# Patient Record
Sex: Female | Born: 1970 | Race: Black or African American | Hispanic: No | Smoking: Former smoker
Health system: Southern US, Community
[De-identification: ages and names within clinical notes are randomized; demographics above are authoritative.]

## PROBLEM LIST (undated history)

## (undated) ENCOUNTER — Emergency Department (HOSPITAL_COMMUNITY): Admission: EM | Payer: Medicaid Other | Source: Home / Self Care

## (undated) DIAGNOSIS — M79604 Pain in right leg: Secondary | ICD-10-CM

## (undated) DIAGNOSIS — F1121 Opioid dependence, in remission: Secondary | ICD-10-CM

## (undated) DIAGNOSIS — M25512 Pain in left shoulder: Secondary | ICD-10-CM

## (undated) DIAGNOSIS — F1110 Opioid abuse, uncomplicated: Secondary | ICD-10-CM

## (undated) DIAGNOSIS — D5700 Hb-SS disease with crisis, unspecified: Secondary | ICD-10-CM

## (undated) DIAGNOSIS — R0902 Hypoxemia: Secondary | ICD-10-CM

## (undated) HISTORY — PX: OTHER SURGICAL HISTORY: SHX169

## (undated) HISTORY — PX: APPENDECTOMY: SHX54

---

## 1997-07-17 ENCOUNTER — Inpatient Hospital Stay (HOSPITAL_COMMUNITY): Admission: AD | Admit: 1997-07-17 | Discharge: 1997-07-17 | Payer: Self-pay | Admitting: Obstetrics & Gynecology

## 1997-07-29 ENCOUNTER — Inpatient Hospital Stay (HOSPITAL_COMMUNITY): Admission: EM | Admit: 1997-07-29 | Discharge: 1997-07-29 | Payer: Self-pay | Admitting: *Deleted

## 1997-08-22 ENCOUNTER — Encounter: Admission: RE | Admit: 1997-08-22 | Discharge: 1997-08-22 | Payer: Self-pay | Admitting: Internal Medicine

## 1997-09-01 ENCOUNTER — Encounter: Admission: RE | Admit: 1997-09-01 | Discharge: 1997-09-01 | Payer: Self-pay | Admitting: Internal Medicine

## 1997-10-29 ENCOUNTER — Inpatient Hospital Stay (HOSPITAL_COMMUNITY): Admission: AD | Admit: 1997-10-29 | Discharge: 1997-10-29 | Payer: Self-pay | Admitting: Obstetrics

## 1997-12-02 ENCOUNTER — Inpatient Hospital Stay (HOSPITAL_COMMUNITY): Admission: AD | Admit: 1997-12-02 | Discharge: 1997-12-02 | Payer: Self-pay | Admitting: *Deleted

## 1998-01-23 ENCOUNTER — Encounter: Admission: RE | Admit: 1998-01-23 | Discharge: 1998-01-23 | Payer: Self-pay | Admitting: Internal Medicine

## 1998-02-23 ENCOUNTER — Encounter: Payer: Self-pay | Admitting: Internal Medicine

## 1998-02-23 ENCOUNTER — Inpatient Hospital Stay (HOSPITAL_COMMUNITY): Admission: EM | Admit: 1998-02-23 | Discharge: 1998-02-25 | Payer: Self-pay | Admitting: Emergency Medicine

## 1998-05-13 ENCOUNTER — Inpatient Hospital Stay (HOSPITAL_COMMUNITY): Admission: AD | Admit: 1998-05-13 | Discharge: 1998-05-13 | Payer: Self-pay | Admitting: Obstetrics

## 1998-05-27 ENCOUNTER — Observation Stay (HOSPITAL_COMMUNITY): Admission: AD | Admit: 1998-05-27 | Discharge: 1998-05-28 | Payer: Self-pay | Admitting: *Deleted

## 1998-06-10 ENCOUNTER — Encounter: Admission: RE | Admit: 1998-06-10 | Discharge: 1998-06-10 | Payer: Self-pay | Admitting: Obstetrics & Gynecology

## 1998-06-30 ENCOUNTER — Inpatient Hospital Stay (HOSPITAL_COMMUNITY): Admission: AD | Admit: 1998-06-30 | Discharge: 1998-06-30 | Payer: Self-pay | Admitting: *Deleted

## 1998-07-01 ENCOUNTER — Encounter: Admission: RE | Admit: 1998-07-01 | Discharge: 1998-07-01 | Payer: Self-pay | Admitting: Obstetrics & Gynecology

## 1998-07-03 ENCOUNTER — Ambulatory Visit (HOSPITAL_COMMUNITY): Admission: RE | Admit: 1998-07-03 | Discharge: 1998-07-03 | Payer: Self-pay | Admitting: Obstetrics

## 1998-07-15 ENCOUNTER — Encounter: Admission: RE | Admit: 1998-07-15 | Discharge: 1998-07-15 | Payer: Self-pay | Admitting: Obstetrics & Gynecology

## 1998-08-04 ENCOUNTER — Inpatient Hospital Stay (HOSPITAL_COMMUNITY): Admission: AD | Admit: 1998-08-04 | Discharge: 1998-08-04 | Payer: Self-pay | Admitting: *Deleted

## 1998-08-12 ENCOUNTER — Encounter: Admission: RE | Admit: 1998-08-12 | Discharge: 1998-08-12 | Payer: Self-pay | Admitting: Obstetrics & Gynecology

## 1998-08-26 ENCOUNTER — Encounter: Admission: RE | Admit: 1998-08-26 | Discharge: 1998-08-26 | Payer: Self-pay | Admitting: Obstetrics & Gynecology

## 1998-08-26 ENCOUNTER — Inpatient Hospital Stay (HOSPITAL_COMMUNITY): Admission: AD | Admit: 1998-08-26 | Discharge: 1998-08-26 | Payer: Self-pay | Admitting: *Deleted

## 1998-09-02 ENCOUNTER — Encounter (HOSPITAL_COMMUNITY): Admission: RE | Admit: 1998-09-02 | Discharge: 1998-12-01 | Payer: Self-pay | Admitting: Obstetrics & Gynecology

## 1998-09-02 ENCOUNTER — Encounter: Admission: RE | Admit: 1998-09-02 | Discharge: 1998-09-02 | Payer: Self-pay | Admitting: Obstetrics & Gynecology

## 1998-09-09 ENCOUNTER — Encounter: Admission: RE | Admit: 1998-09-09 | Discharge: 1998-09-09 | Payer: Self-pay | Admitting: Obstetrics & Gynecology

## 1998-09-25 ENCOUNTER — Encounter: Payer: Self-pay | Admitting: Obstetrics & Gynecology

## 1998-09-30 ENCOUNTER — Encounter: Admission: RE | Admit: 1998-09-30 | Discharge: 1998-09-30 | Payer: Self-pay | Admitting: Obstetrics & Gynecology

## 1998-10-14 ENCOUNTER — Encounter: Admission: RE | Admit: 1998-10-14 | Discharge: 1998-10-14 | Payer: Self-pay | Admitting: Obstetrics & Gynecology

## 1998-10-22 ENCOUNTER — Inpatient Hospital Stay (HOSPITAL_COMMUNITY): Admission: AD | Admit: 1998-10-22 | Discharge: 1998-10-22 | Payer: Self-pay | Admitting: Obstetrics & Gynecology

## 1998-10-22 ENCOUNTER — Encounter: Admission: RE | Admit: 1998-10-22 | Discharge: 1998-10-22 | Payer: Self-pay | Admitting: Obstetrics

## 1998-10-29 ENCOUNTER — Inpatient Hospital Stay (HOSPITAL_COMMUNITY): Admission: AD | Admit: 1998-10-29 | Discharge: 1998-10-29 | Payer: Self-pay | Admitting: Obstetrics & Gynecology

## 1998-10-29 ENCOUNTER — Encounter: Admission: RE | Admit: 1998-10-29 | Discharge: 1998-10-29 | Payer: Self-pay | Admitting: Obstetrics

## 1998-11-03 ENCOUNTER — Inpatient Hospital Stay (HOSPITAL_COMMUNITY): Admission: AD | Admit: 1998-11-03 | Discharge: 1998-11-03 | Payer: Self-pay | Admitting: *Deleted

## 1998-11-05 ENCOUNTER — Encounter: Payer: Self-pay | Admitting: Obstetrics

## 1998-11-05 ENCOUNTER — Ambulatory Visit (HOSPITAL_COMMUNITY): Admission: RE | Admit: 1998-11-05 | Discharge: 1998-11-05 | Payer: Self-pay | Admitting: Obstetrics

## 1998-11-05 ENCOUNTER — Encounter: Admission: RE | Admit: 1998-11-05 | Discharge: 1998-11-05 | Payer: Self-pay | Admitting: Obstetrics

## 1998-11-26 ENCOUNTER — Inpatient Hospital Stay (HOSPITAL_COMMUNITY): Admission: AD | Admit: 1998-11-26 | Discharge: 1998-11-26 | Payer: Self-pay | Admitting: Obstetrics & Gynecology

## 1999-03-16 ENCOUNTER — Encounter: Payer: Self-pay | Admitting: Emergency Medicine

## 1999-03-17 ENCOUNTER — Inpatient Hospital Stay (HOSPITAL_COMMUNITY): Admission: EM | Admit: 1999-03-17 | Discharge: 1999-03-19 | Payer: Self-pay | Admitting: Emergency Medicine

## 1999-11-16 ENCOUNTER — Observation Stay (HOSPITAL_COMMUNITY): Admission: EM | Admit: 1999-11-16 | Discharge: 1999-11-16 | Payer: Self-pay | Admitting: Emergency Medicine

## 1999-12-21 ENCOUNTER — Encounter: Admission: RE | Admit: 1999-12-21 | Discharge: 1999-12-21 | Payer: Self-pay | Admitting: Obstetrics & Gynecology

## 2000-06-01 ENCOUNTER — Encounter: Admission: RE | Admit: 2000-06-01 | Discharge: 2000-06-01 | Payer: Self-pay | Admitting: Obstetrics

## 2000-06-01 ENCOUNTER — Other Ambulatory Visit: Admission: RE | Admit: 2000-06-01 | Discharge: 2000-06-01 | Payer: Self-pay | Admitting: Obstetrics

## 2000-06-26 ENCOUNTER — Inpatient Hospital Stay (HOSPITAL_COMMUNITY): Admission: EM | Admit: 2000-06-26 | Discharge: 2000-06-28 | Payer: Self-pay | Admitting: Emergency Medicine

## 2000-06-27 ENCOUNTER — Encounter: Payer: Self-pay | Admitting: Family Medicine

## 2000-06-28 ENCOUNTER — Encounter: Payer: Self-pay | Admitting: Family Medicine

## 2000-12-15 ENCOUNTER — Inpatient Hospital Stay (HOSPITAL_COMMUNITY): Admission: AD | Admit: 2000-12-15 | Discharge: 2000-12-15 | Payer: Self-pay | Admitting: Obstetrics & Gynecology

## 2001-06-16 ENCOUNTER — Inpatient Hospital Stay (HOSPITAL_COMMUNITY): Admission: AD | Admit: 2001-06-16 | Discharge: 2001-06-16 | Payer: Self-pay

## 2001-12-12 ENCOUNTER — Inpatient Hospital Stay (HOSPITAL_COMMUNITY): Admission: AD | Admit: 2001-12-12 | Discharge: 2001-12-12 | Payer: Self-pay | Admitting: Obstetrics and Gynecology

## 2003-08-18 ENCOUNTER — Emergency Department (HOSPITAL_COMMUNITY): Admission: AD | Admit: 2003-08-18 | Discharge: 2003-08-18 | Payer: Self-pay | Admitting: Family Medicine

## 2003-09-01 ENCOUNTER — Inpatient Hospital Stay (HOSPITAL_COMMUNITY): Admission: EM | Admit: 2003-09-01 | Discharge: 2003-09-03 | Payer: Self-pay | Admitting: Family Medicine

## 2006-02-18 ENCOUNTER — Inpatient Hospital Stay (HOSPITAL_COMMUNITY): Admission: EM | Admit: 2006-02-18 | Discharge: 2006-02-20 | Payer: Self-pay | Admitting: Emergency Medicine

## 2007-07-24 ENCOUNTER — Inpatient Hospital Stay (HOSPITAL_COMMUNITY): Admission: EM | Admit: 2007-07-24 | Discharge: 2007-07-26 | Payer: Self-pay | Admitting: Emergency Medicine

## 2008-04-26 ENCOUNTER — Emergency Department (HOSPITAL_COMMUNITY): Admission: EM | Admit: 2008-04-26 | Discharge: 2008-04-26 | Payer: Self-pay | Admitting: Emergency Medicine

## 2009-01-23 ENCOUNTER — Ambulatory Visit (HOSPITAL_COMMUNITY): Admission: RE | Admit: 2009-01-23 | Discharge: 2009-01-23 | Payer: Self-pay | Admitting: Ophthalmology

## 2009-03-03 ENCOUNTER — Ambulatory Visit (HOSPITAL_COMMUNITY): Admission: RE | Admit: 2009-03-03 | Discharge: 2009-03-04 | Payer: Self-pay | Admitting: Ophthalmology

## 2009-07-15 ENCOUNTER — Observation Stay (HOSPITAL_COMMUNITY): Admission: EM | Admit: 2009-07-15 | Discharge: 2009-07-15 | Payer: Self-pay | Admitting: Emergency Medicine

## 2009-11-20 ENCOUNTER — Emergency Department (HOSPITAL_COMMUNITY): Admission: EM | Admit: 2009-11-20 | Discharge: 2009-11-20 | Payer: Self-pay | Admitting: Emergency Medicine

## 2010-07-24 LAB — RETICULOCYTES
RBC.: 3.56 MIL/uL — ABNORMAL LOW (ref 3.87–5.11)
Retic Count, Absolute: 103.2 10*3/uL (ref 19.0–186.0)

## 2010-07-24 LAB — POCT I-STAT, CHEM 8
Chloride: 108 mEq/L (ref 96–112)
Creatinine, Ser: 0.8 mg/dL (ref 0.4–1.2)
Potassium: 3.9 mEq/L (ref 3.5–5.1)
Sodium: 142 mEq/L (ref 135–145)

## 2010-07-24 LAB — URINALYSIS, ROUTINE W REFLEX MICROSCOPIC
Bilirubin Urine: NEGATIVE
Glucose, UA: NEGATIVE mg/dL
Hgb urine dipstick: NEGATIVE
Ketones, ur: NEGATIVE mg/dL
Nitrite: NEGATIVE
Specific Gravity, Urine: 1.014 (ref 1.005–1.030)
Urobilinogen, UA: 0.2 mg/dL (ref 0.0–1.0)
pH: 5 (ref 5.0–8.0)

## 2010-07-24 LAB — DIFFERENTIAL
Basophils Relative: 0 % (ref 0–1)
Eosinophils Absolute: 0 10*3/uL (ref 0.0–0.7)
Eosinophils Relative: 0 % (ref 0–5)
Lymphs Abs: 6.6 10*3/uL — ABNORMAL HIGH (ref 0.7–4.0)
Monocytes Absolute: 0.8 10*3/uL (ref 0.1–1.0)
Neutro Abs: 4.3 10*3/uL (ref 1.7–7.7)
Neutrophils Relative %: 37 % — ABNORMAL LOW (ref 43–77)

## 2010-07-24 LAB — CBC
HCT: 34.1 % — ABNORMAL LOW (ref 36.0–46.0)
Hemoglobin: 11.3 g/dL — ABNORMAL LOW (ref 12.0–15.0)
MCH: 31.5 pg (ref 26.0–34.0)
MCHC: 33.1 g/dL (ref 30.0–36.0)
RBC: 3.58 MIL/uL — ABNORMAL LOW (ref 3.87–5.11)
WBC: 11.7 10*3/uL — ABNORMAL HIGH (ref 4.0–10.5)

## 2010-07-24 LAB — POCT PREGNANCY, URINE: Preg Test, Ur: NEGATIVE

## 2010-07-24 LAB — POCT CARDIAC MARKERS: CKMB, poc: <1 ng/mL — ABNORMAL LOW (ref 1.0–8.0)

## 2010-07-30 LAB — DIFFERENTIAL
Basophils Absolute: 0 10*3/uL (ref 0.0–0.1)
Eosinophils Absolute: 0.1 10*3/uL (ref 0.0–0.7)
Lymphs Abs: 4.6 10*3/uL — ABNORMAL HIGH (ref 0.7–4.0)
Monocytes Absolute: 0.8 10*3/uL (ref 0.1–1.0)
Monocytes Relative: 6 % (ref 3–12)
Myelocytes: 0 %

## 2010-07-30 LAB — RETICULOCYTES
RBC.: 3.84 MIL/uL — ABNORMAL LOW (ref 3.87–5.11)
Retic Ct Pct: 2.9 % (ref 0.4–3.1)

## 2010-07-30 LAB — CBC
Hemoglobin: 12.5 g/dL (ref 12.0–15.0)
Platelets: 438 10*3/uL — ABNORMAL HIGH (ref 150–400)
WBC: 12.7 10*3/uL — ABNORMAL HIGH (ref 4.0–10.5)

## 2010-07-30 LAB — POCT I-STAT, CHEM 8
Chloride: 111 mEq/L (ref 96–112)
Glucose, Bld: 91 mg/dL (ref 70–99)
HCT: 30 % — ABNORMAL LOW (ref 36.0–46.0)
Hemoglobin: 10.2 g/dL — ABNORMAL LOW (ref 12.0–15.0)
Sodium: 144 mEq/L (ref 135–145)
TCO2: 22 mmol/L (ref 0–100)

## 2010-08-07 ENCOUNTER — Emergency Department (HOSPITAL_COMMUNITY): Payer: Self-pay

## 2010-08-07 ENCOUNTER — Emergency Department (HOSPITAL_COMMUNITY)
Admission: EM | Admit: 2010-08-07 | Discharge: 2010-08-08 | Disposition: A | Discharge status: Home / Self Care | Payer: Self-pay | Attending: Emergency Medicine | Admitting: Emergency Medicine

## 2010-08-07 DIAGNOSIS — M79609 Pain in unspecified limb: Secondary | ICD-10-CM | POA: Insufficient documentation

## 2010-08-07 DIAGNOSIS — D5700 Hb-SS disease with crisis, unspecified: Secondary | ICD-10-CM | POA: Insufficient documentation

## 2010-08-08 LAB — CBC
HCT: 29 % — ABNORMAL LOW (ref 36.0–46.0)
MCH: 30.3 pg (ref 26.0–34.0)
MCHC: 36.2 g/dL — ABNORMAL HIGH (ref 30.0–36.0)
RBC: 3.46 MIL/uL — ABNORMAL LOW (ref 3.87–5.11)
WBC: 18.2 10*3/uL — ABNORMAL HIGH (ref 4.0–10.5)

## 2010-08-08 LAB — DIFFERENTIAL
Band Neutrophils: 0 % (ref 0–10)
Basophils Absolute: 0 10*3/uL (ref 0.0–0.1)
Blasts: 0 %
Eosinophils Relative: 3 % (ref 0–5)
Lymphs Abs: 5.6 10*3/uL — ABNORMAL HIGH (ref 0.7–4.0)
Metamyelocytes Relative: 0 %
Monocytes Relative: 7 % (ref 3–12)
Neutro Abs: 10.8 10*3/uL — ABNORMAL HIGH (ref 1.7–7.7)
Promyelocytes Absolute: 0 %

## 2010-08-08 LAB — URINALYSIS, ROUTINE W REFLEX MICROSCOPIC
Glucose, UA: NEGATIVE mg/dL
Hgb urine dipstick: NEGATIVE
Nitrite: NEGATIVE
Urobilinogen, UA: 1 mg/dL (ref 0.0–1.0)

## 2010-08-08 LAB — RETICULOCYTES
Retic Count, Absolute: 134.9 10*3/uL (ref 19.0–186.0)
Retic Ct Pct: 3.9 % — ABNORMAL HIGH (ref 0.4–3.1)

## 2010-08-08 LAB — BASIC METABOLIC PANEL
BUN: 8 mg/dL (ref 6–23)
Creatinine, Ser: 0.71 mg/dL (ref 0.4–1.2)
GFR calc Af Amer: >60 mL/min (ref >60)
Glucose, Bld: 101 mg/dL — ABNORMAL HIGH (ref 70–99)
Sodium: 140 mEq/L (ref 135–145)

## 2010-08-12 LAB — CBC
MCHC: 34.2 g/dL (ref 30.0–36.0)
MCV: 95 fL (ref 78.0–100.0)
Platelets: 454 10*3/uL — ABNORMAL HIGH (ref 150–400)
WBC: 10.2 10*3/uL (ref 4.0–10.5)

## 2010-08-12 LAB — PROTIME-INR: INR: 1.16 (ref 0.00–1.49)

## 2010-08-13 LAB — CBC
HCT: 30.6 % — ABNORMAL LOW (ref 36.0–46.0)
Hemoglobin: 10.1 g/dL — ABNORMAL LOW (ref 12.0–15.0)
MCV: 95.6 fL (ref 78.0–100.0)
RBC: 3.2 MIL/uL — ABNORMAL LOW (ref 3.87–5.11)
WBC: 9.8 10*3/uL (ref 4.0–10.5)

## 2010-09-21 NOTE — H&P (Signed)
NAMEKevan [name] NO.:  [id number]   MEDICAL RECORD NO.:  [id number]          PATIENT TYPE:  INP   LOCATION:  1829                         FACILITY:  [facility]   PHYSICIAN:  Lindsey [name], D.O.    DATE OF BIRTH:  November 11, 1970   DATE OF ADMISSION:  07/24/2007  DATE OF DISCHARGE:                              HISTORY & PHYSICAL   PRIMARY CARE PHYSICIAN:  Lindsey [name], M.D.   HISTORY OF PRESENT ILLNESS:  Chest pain.   HISTORY OF PRESENT ILLNESS:  [name] is a very pleasant 40-year-  old female who suffers with sickle cell disease who was in her usual  state of health until she developed some chest pain, pleuritic in nature  yesterday while at rest.  She had no problems previous to this.  No  fevers and home. No productive cough.  In the emergency department she  underwent a CT scan to rule out a pulmonary embolus, however, his  revealed a 0.9 x 2 cm right upper lobe mass with smaller adjacent  nodules, possible neoplasm or infection.  I spoke with Dr. Chestine Spore who  recommended close followup within two weeks with a repeat imaging study  after a course of treatment for an infectious etiology and perhaps PET  scan or needle biopsy at the time.   PAST MEDICAL HISTORY:  Significant for sickle cell anemia, appendectomy,  as well as Cesarean section, status post bilateral tubal ligation.   MEDICATIONS:  At home she takes morphine, Vicon Forte, doses are not  available at this time.  Will need to be reconciled.   ALLERGIES:  No known drug allergies.   SOCIAL HISTORY:  She denies tobacco, denies regular alcohol use, only  occasional.  She is married with three children.  Her occupation is  working in the call center for [employer].   FAMILY HISTORY:  Unremarkable.   REVIEW OF SYSTEMS:  She states that she has been in her usual state of  health for her usual constellation of symptoms with sickle cell  predominantly involving her lower extremities and does not  usually  involve her chest as described above.  In any event her appetite, weight  have been stable.  She has not reported any fevers, chills, increased  swelling, no recent travel, no toxic exposure.  No known history of  tuberculosis.   PHYSICAL EXAMINATION:  VITAL SIGNS:  In emergency department her vital  signs were stable.  Her blood pressure was 109/59, temperature 97.1,  heart rate 63, respirations 20, oxygen saturation 100% on 2L.  HEENT:  Reveals head to be normocephalic, atraumatic.  Extraocular  movements intact.  NECK:  Supple nontender.  No palpable thyromegaly or masses.  CARDIOVASCULAR:  Exam reveals normal S1 and S2.  LUNGS:  Clear bilaterally.  She does have some splinting with deep  inspiration.  ABDOMEN:  Soft, nontender.  No rebound or guarding.  EXTREMITIES:  Lower extremities reveal no edema, no calf tenderness.   LABORATORY DATA:  Reticulocyte count was 4.5, sodium 142, potassium 4.1,  BUN 13, creatinine 0.7, glucose 94.  White blood cell count 14.9,  hemoglobin 10.1, platelet count 403,000, MCV 94.  CT scan was negative  for pulmonary embolus; however, with lesion as described above.  I spoke  with Dr. Chestine Spore of radiology.   ASSESSMENT:  1. Right upper lobe pneumonia.  2. Mass.  3. Sickle cell pain crisis.  4. Leukocytosis.   PLAN:  At this time Mrs. Drumwright will be admitted for initiation of  intravenous antibiotics and pain control.  Will assess her response and  recommend close followup of this lesion.  She may need a PET scan after  acute resolution of her presenting illness of probable pneumonia.      Lindsey [name], D.O.  Electronically Signed     ESS/MEDQ  D:  07/24/2007  T:  07/25/2007  Job:  [id number]   cc:   Lindsey [name], M.D.

## 2010-09-21 NOTE — Discharge Summary (Signed)
NAMEKevan Rosebush NO.:  [id number]   MEDICAL RECORD NO.:  [id number]          PATIENT TYPE:  INP   LOCATION:  5502                         FACILITY:  [facility]   PHYSICIAN:  Hillery Aldo, M.D.   DATE OF BIRTH:  Feb 22, 1971   DATE OF ADMISSION:  07/24/2007  DATE OF DISCHARGE:  07/26/2007                               DISCHARGE SUMMARY   PRIMARY CARE PHYSICIAN:  Lorelle Formosa, M.D.   DISCHARGE DIAGNOSES:  1. Right upper lobe pneumonia with questionable underlying mass,      followup imaging after full treatment for pneumonia recommended.  2. Sickle cell anemia with vasoocclusive pain crisis.   DISCHARGE MEDICATIONS:  1. Avelox 400 mg daily x5 more days.  2. MS Contin 60 mg q.12 hours.  3. MSIR 30 mg q.6 hours p.r.n.   CONSULTATIONS:  None.   BRIEF ADMISSION HISTORY OF PRESENT ILLNESS:  The patient is a 40-year-  old female who presented to the hospital with a chief complaint of right  pleuritic-type chest pain.  She was admitted for further evaluation when  her initial laboratory tests revealed leukocytosis along with a possible  right upper lobe mass versus infiltrate.  For the full details, please  see the dictated H&P done by Dr. Angelena Sole.   PROCEDURES AND DIAGNOSTIC STUDIES:  1. Chest x-ray on July 24, 2007, showed cardiac enlargement with      vascular congestion, no edema.  Scar in the right lower lobe      unchanged.  2. CT scan of the chest on July 24, 2007, was negative for pulmonary      embolism.  There is vascular congestion.  There was a 9 x 20-mm      mass in the right upper lobe posteriorly with small adjacent      nodule, possibly representative of neoplasm or infection.  Close      followup is warranted.   DISCHARGE LABORATORY VALUES:  Sodium is 142, potassium 3.8, chloride  110, bicarb 27, BUN 3, creatinine 0.72, and glucose 95.  White blood  cell count was 11.2, hemoglobin 9.6, hematocrit 28.4, and platelets 379.   HOSPITAL  COURSE BY PROBLEM:  1. Right upper lobe pneumonia versus mass:  The patient had      leukocytosis and low-grade fever consistent with infectious      etiology.  She was treated empirically with Avelox and after 2 days      of therapy, her white blood cell count went from 14.9 to 11.2.  Her      pain resolved.  We are going to complete a 7-week course of therapy      with Avelox and at that time, she should follow up with Dr.      Ronne Binning for followup imaging to ensure resolution of the pneumonia      and to ensure that there is no underlying mass.  2. Sickle cell anemia with vasoocclusive pain crisis:  The patient was      treated with a combination of her routine MS Contin along with      Dilaudid IV for breakthrough pain.  At this time, she is reporting      no pain and is therefore stable for discharge.   DISPOSITION:  The patient is medically stable and will be discharged  home.  She is instructed to follow up with Dr. Ronne Binning after she  completes her course of antibiotic therapy for followup imaging.      Hillery Aldo, M.D.  Electronically Signed     CR/MEDQ  D:  07/26/2007  T:  07/26/2007  Job:  [id number]   cc:   Lorelle Formosa, M.D.

## 2010-09-24 NOTE — Discharge Summary (Signed)
NAMESIGOURNEY, PORTILLO             ACCOUNT NO.:  [id number]   MEDICAL RECORD NO.:  [id number]          PATIENT TYPE:  INP   LOCATION:  3004                         FACILITY:  [facility]   PHYSICIAN:  Hind I Elsaid, MD      DATE OF BIRTH:  [birth year]   DATE OF ADMISSION:  02/18/2006  DATE OF DISCHARGE:  02/20/2006                                 DISCHARGE SUMMARY   BRIEF HISTORY OF PRESENT ILLNESS:  She is a 40 year old African-American  female with history sickle cell anemia.  She was admitted to the hospital  through the ER with generalized pain and left thigh pain and mainly only her  lower extremities.  She has been treated with IV Dilaudid, but pain is still  there so patient mainly admitted to the hospital for pain control.   HOSPITAL COURSE:  1. The patient was admitted to the floor, IV antibiotics, IV fluid were      started for hydration and IV pain medication was started.  Patient was      started on pain medication, Tylenol 650 mg p.r.n. q.4.h. and morphine      sulfate which the dose is 2 mg IV q.2.h. p.r.n.  Patient also started      on folic acid 1 mg p.o. daily and IV fluid.  2. Patient also have urinary tract infection which has responded to Cipro      IV.  Patient has no fever right now.   DISCHARGE DIAGNOSES:  1. Sickle cell painful crisis.  2. Urinary tract infection.   CONDITION ON DISCHARGE:  Patient feels better, no pain.  Temperature 98.5,  pulse rate 62, blood pressure 126/73, respiratory rate 20.  General  examination is all negative.  Heart examination is normal.  Lung, abdominal  examination, extremities, all of them are negative.  There is no point  tenderness.   PLAN OF CARE:  Patient is to be discharged on MS-Contin 30 mg p.o. q.8.h.  p.r.n. for pain and continue with folic acid 1 mg p.o. daily, for UTI,  patient will receive Cipro 500 mg p.o. b.i.d. for one week and patient to  follow to be followed with Dr. Billee Cashing, her primary care and her  sickle cell specialist, as an outpatient after one week.      Hind Bosie Helper, MD  Electronically Signed     HIE/MEDQ  D:  02/20/2006  T:  02/20/2006  Job:  [id number]

## 2010-09-24 NOTE — Discharge Summary (Signed)
Lindsey [name], HEIMANN NO.:  [id number]   MEDICAL RECORD NO.:  [id number]          PATIENT TYPE:   LOCATION:                                 FACILITY:   PHYSICIAN:  Lorelle Formosa, M.D.   DATE OF BIRTH:   DATE OF ADMISSION:  09/01/2003  DATE OF DISCHARGE:  09/03/2003                                 DISCHARGE SUMMARY   ADMISSION DIAGNOSES:  1.  SS sickle cell vaso-occlusive crisis.  2.  Gastroenteritis.   DISCHARGE DIAGNOSES:  1.  SS sickle cell vaso-occlusive crisis.  2.  Gastroenteritis.   DISCHARGE CONDITION:  Stable.   DISCHARGE DISPOSITION:  Follow up in office in approximately 1 to 2 weeks.   DISCHARGE MEDICATIONS:  The patient is to continue her previous home  medicines which include MS Contin and MSIR.   HISTORY:  This patient is a 40 year old single black woman who has known SS  sickle cell disease. She presented with pain exacerbation over 1 to 2 days.  She was initially seen by Dr. Stacie [name] at the [facility]  emergency room and given IV fluids and Dilaudid IV. She had not improved  enough to go home and thus referred me for admission. She denies headaches,  abdominal or back pain, fever, or chills. Thus, she was admitted. She has  not had any recent admissions to the area hospitals.   Past medical history, family history, personal history, social history, and  review of systems were basically unremarkable.   PHYSICAL EXAMINATION:  VITAL SIGNS:  Revealed blood pressure 103/51, pulse  80, respirations 18, temperature 98.2 orally.  GENERAL:  The patient was lying supine in bed. Rather somber. She was fully  oriented and alert.  HEENT:  Revealed PERRLA. EOMs were intact. Mouth had normal mucosa which was  moist.  NECK:  Supple without jugular venous distention, bruits, nodes, or  thyromegaly.  CHEST:  Clear to auscultation.  HEART:  Regular rate and rhythm without murmur.  ABDOMEN:  Soft and flat. Bowel sounds were  normal.  EXTREMITIES:  Had no lesions or swelling.  NEUROLOGICAL:  Grossly physiologic.   LABORATORY DATA:  CBC revealed a white count of 11.8, hemoglobin 10.3,  hematocrit 29.5, platelets 458,000, retic count 3.1. Chemistries were  basically normal. Lipase was 28.   HOSPITAL COURSE:  The patient was admitted to [facility] and given IV  fluids of D5 1/2 normal saline with 20 mEq of KCl at 100 cc per hour. She  was given Protonix 40 mg IV, ibuprofen 800 mg p.o., Benadryl, and Dilaudid 2  mg with 12.5 mg every 2 hours as needed. She was given Benadryl and  gradually improved. She was able to be transitioned quickly to Dilaudid 4 mg  p.o. for pain and discharged for outpatient management.       ___________________________________________  Lorelle Formosa, M.D.    WWM/MEDQ  D:  03/25/2004  T:  03/26/2004  Job:  [id number]

## 2010-09-24 NOTE — H&P (Signed)
NAMEKENNEDIE, PARDOE NO.:  [id number]   MEDICAL RECORD NO.:  [id number]          PATIENT TYPE:  EMS   LOCATION:  MAJO                         FACILITY:  [facility]   PHYSICIAN:  Mobolaji B. Bakare, M.D.DATE OF BIRTH:  1970-10-07   DATE OF ADMISSION:  02/18/2006  DATE OF DISCHARGE:                                HISTORY & PHYSICAL   PRIMARY CARE PHYSICIAN:  Lorelle Formosa, M.D.   CHIEF COMPLAINT:  Sickle cell crisis.   HISTORY OF PRESENTING COMPLAINT:  Ms. Kyler is a 40 year old African-  American female with a history of sickle cell anemia.  She started  experiencing a left crisis in the last 2 days.  She did not respond to her  usual dose of morphine and she came into the emergency department.  She had  been treated with IV Dilaudid but pain is still lingering on and this  patient needs to be hospitalized for pain control.   She denies any trauma.  There is no pain located elsewhere at this time.  She denies fever or chills, cough, shortness of breath, or chest pain.  There is no vomiting, diarrhea or dysuria or urgency.   REVIEW OF SYSTEMS:  As in the HPI.   PAST MEDICAL HISTORY:  Sickle cell anemia with previous hospitalization for  sickle cell crisis.   MEDICATIONS:  1. Morphine.  The patient cannot remember the dose.  She uses q.4h. p.r.n.  2. Folic acid 1 mg daily.   ALLERGIES:  No known drug allergies.   FAMILY HISTORY:  Father is deceased.  He died from gunshot injury.  Mother  is alive and well.   SOCIAL HISTORY:  She does not drink alcohol or smoke cigarettes.  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   PHYSICAL EXAMINATION:  INITIAL VITALS:  Temperature 99, blood pressure  156/83, pulse 101, respirations are 20, O2 saturations are 100% on room air.  GENERAL:  On examination, the patient is awake, alert, oriented in time,  place and person.  HEENT:  Normocephalic, atraumatic head.  Pupils equal, round, and reactive  to light.  Mucous  membranes moist.  LUNGS:  Clear clinically to auscultation.  CARDIOVASCULAR:  S1, S2, regular, no murmur, no gallop, no rub.  ABDOMEN:  Not distended, not tender.  EXTREMITIES:  She has no obvious swelling in both of her extremities.  There  is tenderness on the left upper limb.  CENTRAL NERVOUS SYSTEM:  No focal neurologic deficits.   INITIAL LABORATORY DATA:  Absolute reticulocyte count 449.  Percentage  slightly elevated, 4.9.  Sedimentation rate 3.  White blood cells 14.7,  hemoglobin 10.7, hematocrit 31.7, platelets 548.  Differential is 37%  neutrophils, 57% lymphocytes.  There are __________  cells.  BMET:  Sodium  140, potassium 4.0, chloride 108, CO2 26, glucose 100, BUN 6, creatinine  0.6, calcium 9.0.  Chest x-ray shows borderline cardiomegaly, mild vascular  congestion.   ASSESSMENT AND PLAN:  Ms. Dodds is a 40 year old African-American female  presenting in sickle cell crisis.  There is no obvious infection.  Leukocytosis is likely reactive.  Will  admit to medical/surgical floor.  Morphine 1-2 mg IV q.2-3h. p.r.n.  Folic acid 1 mg p.o. daily.  Benadryl 25-  50 mg IV q.4h. p.r.n. for itching.  Promethazine 12.5-25 mg IV q.4h. p.r.n.  nausea and vomiting.  Will check urinalysis.  She will be given DVT  prophylaxis and GI prophylaxis.      Mobolaji B. Corky Downs, M.D.  Electronically Signed     MBB/MEDQ  D:  02/18/2006  T:  02/18/2006  Job:  [id number]   cc:   Lorelle Formosa, M.D.

## 2010-09-24 NOTE — Discharge Summary (Signed)
NAMEMarland Kitchen  GEARLINE, SPILMAN                       ACCOUNT NO.:  [id number]   MEDICAL RECORD NO.:  [id number]                   PATIENT TYPE:  INP   LOCATION:  0378                                 FACILITY:  [facility]   PHYSICIAN:  Lorelle Formosa, M.D.           DATE OF BIRTH:  1970-07-24   DATE OF ADMISSION:  09/01/2003  DATE OF DISCHARGE:  09/03/2003                                 DISCHARGE SUMMARY   __________  SS vaso-occlusive disease.  __________   The patient is a __________  admitted with __________  after she was  initially seen in the ER at __________ and given IV fluids and __________  IV.  She did not improve enough to be sent home and was admitted.  __________   Blood pressure 123/50, pulse 80, and respirations 18.  __________                                               Lorelle Formosa, M.D.    WWM/MEDQ  D:  09/25/2003  T:  09/25/2003  Job:  [id number]

## 2011-01-31 LAB — I-STAT 8, (EC8 V) (CONVERTED LAB)
BUN: <3 — ABNORMAL LOW
Bicarbonate: 26.8 — ABNORMAL HIGH
Chloride: 109
Glucose, Bld: 94
HCT: 30 — ABNORMAL LOW
Hemoglobin: 10.2 — ABNORMAL LOW
Sodium: 142
pCO2, Ven: 47.7

## 2011-01-31 LAB — CBC
HCT: 28.4 — ABNORMAL LOW
HCT: 29.5 — ABNORMAL LOW
Hemoglobin: 10.1 — ABNORMAL LOW
Hemoglobin: 9.6 — ABNORMAL LOW
MCHC: 33.7
MCV: 94.3
MCV: 94.5
Platelets: 403 — ABNORMAL HIGH
RBC: 3.01 — ABNORMAL LOW
RDW: 15.5
WBC: 11.2 — ABNORMAL HIGH

## 2011-01-31 LAB — DIFFERENTIAL
Basophils Absolute: 0.1
Eosinophils Absolute: 0.2
Eosinophils Relative: 2
Monocytes Absolute: 0.8

## 2011-01-31 LAB — LIPID PANEL
Triglycerides: 38
VLDL: 8

## 2011-01-31 LAB — BASIC METABOLIC PANEL
CO2: 27
Chloride: 110
Creatinine, Ser: 0.72
GFR calc Af Amer: >60
Potassium: 3.8

## 2011-01-31 LAB — POCT I-STAT CREATININE
Creatinine, Ser: 0.7
Operator id: 265201

## 2011-01-31 LAB — RETICULOCYTES: Retic Ct Pct: 4.5 — ABNORMAL HIGH

## 2011-01-31 LAB — TSH: TSH: 1.192

## 2011-02-10 LAB — CBC
HCT: 33.6 % — ABNORMAL LOW (ref 36.0–46.0)
MCHC: 33.8 g/dL (ref 30.0–36.0)
MCV: 92.8 fL (ref 78.0–100.0)
Platelets: 495 10*3/uL — ABNORMAL HIGH (ref 150–400)
RDW: 15.9 % — ABNORMAL HIGH (ref 11.5–15.5)

## 2011-02-10 LAB — DIFFERENTIAL
Basophils Absolute: 0 10*3/uL (ref 0.0–0.1)
Basophils Relative: 0 % (ref 0–1)
Eosinophils Absolute: 0 10*3/uL (ref 0.0–0.7)
Lymphocytes Relative: 27 % (ref 12–46)
Lymphs Abs: 3.5 10*3/uL (ref 0.7–4.0)
Neutro Abs: 8.8 10*3/uL — ABNORMAL HIGH (ref 1.7–7.7)

## 2011-04-05 ENCOUNTER — Encounter: Payer: Self-pay | Admitting: *Deleted

## 2011-04-05 ENCOUNTER — Emergency Department (HOSPITAL_COMMUNITY)
Admission: EM | Admit: 2011-04-05 | Discharge: 2011-04-05 | Disposition: A | Discharge status: Home / Self Care | Payer: Self-pay | Attending: Emergency Medicine | Admitting: Emergency Medicine

## 2011-04-05 DIAGNOSIS — M79609 Pain in unspecified limb: Secondary | ICD-10-CM | POA: Insufficient documentation

## 2011-04-05 DIAGNOSIS — D5700 Hb-SS disease with crisis, unspecified: Secondary | ICD-10-CM | POA: Insufficient documentation

## 2011-04-05 HISTORY — DX: Hb-SS disease with crisis, unspecified: D57.00

## 2011-04-05 LAB — BASIC METABOLIC PANEL
BUN: 7 mg/dL (ref 6–23)
Calcium: 8.9 mg/dL (ref 8.4–10.5)
GFR calc Af Amer: >90 mL/min (ref >90)
GFR calc non Af Amer: >90 mL/min (ref >90)
Glucose, Bld: 102 mg/dL — ABNORMAL HIGH (ref 70–99)
Potassium: 4.2 mEq/L (ref 3.5–5.1)
Sodium: 138 mEq/L (ref 135–145)

## 2011-04-05 LAB — CBC
Hemoglobin: 10.7 g/dL — ABNORMAL LOW (ref 12.0–15.0)
MCH: 30.2 pg (ref 26.0–34.0)
MCHC: 36.1 g/dL — ABNORMAL HIGH (ref 30.0–36.0)
RDW: 14.9 % (ref 11.5–15.5)

## 2011-04-05 LAB — RETICULOCYTES: RBC.: 3.54 MIL/uL — ABNORMAL LOW (ref 3.87–5.11)

## 2011-04-05 MED ORDER — KETOROLAC TROMETHAMINE 30 MG/ML IJ SOLN
30.0000 mg | Freq: Once | INTRAMUSCULAR | Status: AC
  Administered 2011-04-05: 30 mg via INTRAVENOUS
  Filled 2011-04-05: qty 1

## 2011-04-05 MED ORDER — HYDROMORPHONE HCL PF 1 MG/ML IJ SOLN
1.0000 mg | Freq: Once | INTRAMUSCULAR | Status: AC
  Administered 2011-04-05: 1 mg via INTRAVENOUS
  Filled 2011-04-05: qty 1

## 2011-04-05 MED ORDER — SODIUM CHLORIDE 0.9 % IV BOLUS (SEPSIS)
1000.0000 mL | Freq: Once | INTRAVENOUS | Status: AC
  Administered 2011-04-05: 1000 mL via INTRAVENOUS

## 2011-04-05 NOTE — ED Provider Notes (Signed)
History     CSN: [id number] Arrival date & time: 04/05/2011  1:22 PM   First MD Initiated Contact with Patient 04/05/11 1335      Chief Complaint  Patient presents with  . Sickle Cell Pain Crisis    left arm pain    (Consider location/radiation/quality/duration/timing/severity/associated sxs/prior treatment) Patient is a 40 y.o. female presenting with sickle cell pain. The history is provided by the patient.  Sickle Cell Pain Crisis  This is a new problem. The current episode started today. The onset was sudden. The problem occurs rarely. The problem has been unchanged. The pain is associated with an unknown factor. The pain is present in the left side and upper extremities (L arm). Site of Pain: diffuse. The pain is similar to prior episodes. The pain is severe. The symptoms are relieved by nothing. The symptoms are not relieved by one or more prescription drugs. The symptoms are aggravated by movement. Pertinent negatives include no chest pain, no abdominal pain, no nausea, no vomiting, no headaches, no back pain, no loss of sensation, no weakness, no cough, no difficulty breathing and no rash. There is no swelling present. Her past medical history does not include chronic pain. There have been no frequent pain crises. She has not been treated with hydroxyurea. Recently, medical care has been given by the PCP. Services Performed: 2 weeks ago by hematologist; all was normal at visit.    Past Medical History  Diagnosis Date  . Sickle cell crisis     Past Surgical History  Procedure Date  . Appendectomy   . Other surgical history     c-section    No family history on file.  History  Substance Use Topics  . Smoking status: Never Smoker   . Smokeless tobacco: Not on file  . Alcohol Use: No    OB History    Grav Para Term Preterm Abortions TAB SAB Ect Mult Living                  Review of Systems  Constitutional: Negative for fever and chills.  Respiratory: Negative for  cough and shortness of breath.   Cardiovascular: Negative for chest pain and palpitations.  Gastrointestinal: Negative for nausea, vomiting and abdominal pain.  Musculoskeletal: Negative for back pain and joint swelling.  Skin: Negative for color change and rash.  Neurological: Negative for dizziness, weakness, light-headedness, numbness and headaches.  All other systems reviewed and are negative.    Allergies  Review of patient's allergies indicates no known allergies.  Home Medications   Current Outpatient Rx  Name Route Sig Dispense Refill  . SLEEP AID PO Oral Take 1 tablet by mouth at bedtime as needed. To help sleep.     . MORPHINE SULFATE ER 100 MG PO TB12 Oral Take 100 mg by mouth every 4 (four) hours as needed. For pain    . MORPHINE SULFATE 30 MG PO TABS Oral Take 30 mg by mouth every 4 (four) hours as needed. For pain       BP 112/89  Pulse 85  Temp(Src) 98.2 F (36.8 C) (Oral)  Resp 22  SpO2 100%  Physical Exam  Nursing note and vitals reviewed. Constitutional: She is oriented to person, place, and time. She appears well-developed and well-nourished.       In pain  HENT:  Head: Normocephalic and atraumatic.  Eyes: Pupils are equal, round, and reactive to light.  Cardiovascular: Normal rate, regular rhythm, normal heart sounds and intact distal  pulses.   Pulmonary/Chest: Effort normal and breath sounds normal. No respiratory distress.  Abdominal: Soft. She exhibits no distension. There is no tenderness.  Musculoskeletal: She exhibits tenderness (L arm, diffusely).  Neurological: She is alert and oriented to person, place, and time.  Skin: Skin is warm and dry.  Psychiatric: She has a normal mood and affect.    ED Course  Procedures (including critical care time)  Labs Reviewed  CBC - Abnormal; Notable for the following:    WBC 13.7 (*)    RBC 3.54 (*)    Hemoglobin 10.7 (*)    HCT 29.6 (*)    MCHC 36.1 (*)    Platelets 434 (*)    All other components  within normal limits  BASIC METABOLIC PANEL - Abnormal; Notable for the following:    Glucose, Bld 102 (*)    All other components within normal limits  RETICULOCYTES - Abnormal; Notable for the following:    Retic Ct Pct 3.5 (*)    RBC. 3.54 (*)    All other components within normal limits   No results found.   1. Sickle cell pain crisis       MDM  40 yo F presents with onset of L arm sickle cell pain today; states had sudden onset, similar to prior episodes, not relieved with morphine at home. Denies injury, fevers, chest pain, SOB, other complaints. Pt looks to be in pain, and has diffuse ttp of L arm. Review of chart shows infrequent visit for sickle cell pain. Will treat pain, check basic labs in case pt needs to be admitted for further pain control.  Labs unremarkable. Pt with mild improvement of pain to 8/10; will redose pain medication.  Pain improved to 6/10; states is still painful, but more manageable. Thinks with one more dose of pain medication, will be improved enough for d/c home.  Pt pain 4/10; states is significantly improved and able to be managed at home. Pt looks much more comfortable currently. Discussed treatment at home, indications for return, and pt expresses understanding.       Theotis Burrow, MD 04/05/11 1626

## 2011-04-05 NOTE — ED Notes (Signed)
Pt is here with sickle cell pain to left arm and crying.  Pt sts it started this am.  Pt states her pain is either in her arms or legs.  No chest pain

## 2011-04-06 NOTE — ED Provider Notes (Signed)
I saw and evaluated the patient, reviewed the resident's note and I agree with the findings and plan.   Georgean Spainhower A. Patrica Duel, MD 04/06/11 [phone]

## 2012-05-10 ENCOUNTER — Telehealth (HOSPITAL_COMMUNITY): Payer: Self-pay | Admitting: Internal Medicine

## 2012-05-10 ENCOUNTER — Non-Acute Institutional Stay (HOSPITAL_BASED_OUTPATIENT_CLINIC_OR_DEPARTMENT_OTHER)
Admission: AD | Admit: 2012-05-10 | Discharge: 2012-05-11 | Discharge status: Against Medical Advice | Payer: Self-pay | Source: Home / Self Care | Attending: Internal Medicine | Admitting: Internal Medicine

## 2012-05-10 ENCOUNTER — Encounter (HOSPITAL_COMMUNITY): Payer: Self-pay | Admitting: Neurology

## 2012-05-10 ENCOUNTER — Observation Stay (HOSPITAL_COMMUNITY)
Admission: EM | Admit: 2012-05-10 | Discharge: 2012-05-10 | Disposition: A | Discharge status: Home / Self Care | Payer: Self-pay | Attending: Emergency Medicine | Admitting: Emergency Medicine

## 2012-05-10 ENCOUNTER — Encounter (HOSPITAL_COMMUNITY): Payer: Self-pay | Admitting: *Deleted

## 2012-05-10 ENCOUNTER — Observation Stay (HOSPITAL_COMMUNITY): Payer: Self-pay

## 2012-05-10 DIAGNOSIS — D5700 Hb-SS disease with crisis, unspecified: Principal | ICD-10-CM | POA: Insufficient documentation

## 2012-05-10 DIAGNOSIS — IMO0001 Reserved for inherently not codable concepts without codable children: Secondary | ICD-10-CM | POA: Insufficient documentation

## 2012-05-10 DIAGNOSIS — M25569 Pain in unspecified knee: Secondary | ICD-10-CM | POA: Insufficient documentation

## 2012-05-10 LAB — CBC WITH DIFFERENTIAL/PLATELET
Band Neutrophils: 0 % (ref 0–10)
Basophils Absolute: 0 10*3/uL (ref 0.0–0.1)
Basophils Relative: 0 % (ref 0–1)
Blasts: 0 %
HCT: 29.5 % — ABNORMAL LOW (ref 36.0–46.0)
Hemoglobin: 10.9 g/dL — ABNORMAL LOW (ref 12.0–15.0)
MCH: 30.5 pg (ref 26.0–34.0)
MCHC: 36.9 g/dL — ABNORMAL HIGH (ref 30.0–36.0)
Myelocytes: 0 %
Promyelocytes Absolute: 0 %
RDW: 15.1 % (ref 11.5–15.5)

## 2012-05-10 LAB — HEPATIC FUNCTION PANEL
ALT: 13 U/L (ref 0–35)
AST: 25 U/L (ref 0–37)
Albumin: 4 g/dL (ref 3.5–5.2)
Bilirubin, Direct: 0.2 mg/dL (ref 0.0–0.3)
Total Bilirubin: 0.9 mg/dL (ref 0.3–1.2)

## 2012-05-10 LAB — BASIC METABOLIC PANEL
GFR calc Af Amer: >90 mL/min (ref >90)
GFR calc non Af Amer: >90 mL/min (ref >90)
Potassium: 4.2 mEq/L (ref 3.5–5.1)
Sodium: 139 mEq/L (ref 135–145)

## 2012-05-10 LAB — RETICULOCYTES
RBC.: 3.57 MIL/uL — ABNORMAL LOW (ref 3.87–5.11)
Retic Count, Absolute: 221.3 10*3/uL — ABNORMAL HIGH (ref 19.0–186.0)
Retic Ct Pct: 6.2 % — ABNORMAL HIGH (ref 0.4–3.1)

## 2012-05-10 MED ORDER — ONDANSETRON HCL 4 MG/2ML IJ SOLN: 4.0000 mg | Freq: Four times a day (QID) | INTRAMUSCULAR | Status: DC | PRN

## 2012-05-10 MED ORDER — MORPHINE SULFATE 15 MG PO TABS: 30.0000 mg | ORAL_TABLET | ORAL | Status: DC | PRN

## 2012-05-10 MED ORDER — HYDROMORPHONE HCL PF 2 MG/ML IJ SOLN
2.0000 mg | INTRAMUSCULAR | Status: DC | PRN
  Administered 2012-05-10: 2 mg via INTRAVENOUS
  Filled 2012-05-10: qty 1

## 2012-05-10 MED ORDER — ACETAMINOPHEN 325 MG PO TABS: 650.0000 mg | ORAL_TABLET | Freq: Four times a day (QID) | ORAL | Status: DC | PRN

## 2012-05-10 MED ORDER — HYDROMORPHONE HCL PF 1 MG/ML IJ SOLN: 1.0000 mg | Freq: Once | INTRAMUSCULAR | Status: DC

## 2012-05-10 MED ORDER — MORPHINE SULFATE CR 100 MG PO TB12: 100.0000 mg | ORAL_TABLET | Freq: Two times a day (BID) | ORAL | Status: DC

## 2012-05-10 MED ORDER — KETOROLAC TROMETHAMINE 30 MG/ML IJ SOLN
30.0000 mg | Freq: Once | INTRAMUSCULAR | Status: AC
  Administered 2012-05-10: 30 mg via INTRAVENOUS
  Filled 2012-05-10: qty 1

## 2012-05-10 MED ORDER — MORPHINE SULFATE ER 100 MG PO TBCR
100.0000 mg | EXTENDED_RELEASE_TABLET | Freq: Two times a day (BID) | ORAL | Status: DC
  Administered 2012-05-10: 100 mg via ORAL
  Filled 2012-05-10: qty 1

## 2012-05-10 MED ORDER — HYDROMORPHONE HCL PF 1 MG/ML IJ SOLN
1.0000 mg | Freq: Once | INTRAMUSCULAR | Status: AC
  Administered 2012-05-10: 1 mg via INTRAVENOUS
  Filled 2012-05-10: qty 1

## 2012-05-10 MED ORDER — HYDROMORPHONE HCL PF 2 MG/ML IJ SOLN
2.0000 mg | Freq: Once | INTRAMUSCULAR | Status: AC
  Administered 2012-05-10: 2 mg via INTRAVENOUS
  Filled 2012-05-10: qty 1

## 2012-05-10 MED ORDER — DEXTROSE-NACL 5-0.45 % IV SOLN
INTRAVENOUS | Status: DC
  Administered 2012-05-10: 22:00:00 via INTRAVENOUS

## 2012-05-10 MED ORDER — KETOROLAC TROMETHAMINE 30 MG/ML IJ SOLN: 30.0000 mg | Freq: Three times a day (TID) | INTRAMUSCULAR | Status: DC | PRN

## 2012-05-10 NOTE — ED Notes (Signed)
[facility] to pick pt up, Acute room 2

## 2012-05-10 NOTE — ED Provider Notes (Signed)
History     CSN: [id number]  Arrival date & time 05/10/12  1556   First MD Initiated Contact with Patient 05/10/12 1729      Chief Complaint  Patient presents with  . Sickle Cell Pain Crisis    (Consider location/radiation/quality/duration/timing/severity/associated sxs/prior treatment) HPI Complains of right-sided anterior thigh pain onset 11 AM today typical sickle cell pain she's had in the past not made better or worse by anything pain is nonradiating she is treated herself with morphine, without relief. No other associated symptoms. Past Medical History  Diagnosis Date  . Sickle cell crisis     Past Surgical History  Procedure Date  . Appendectomy   . Other surgical history     c-section  . Cesarean section     No family history on file.  History  Substance Use Topics  . Smoking status: Never Smoker   . Smokeless tobacco: Not on file  . Alcohol Use: Yes     Comment: occasionally    OB History    Grav Para Term Preterm Abortions TAB SAB Ect Mult Living                  Review of Systems  Constitutional: Negative.   HENT: Negative.   Respiratory: Negative.   Cardiovascular: Negative.   Gastrointestinal: Negative.   Musculoskeletal: Positive for myalgias.       Right thigh pain  Skin: Negative.   Neurological: Negative.   Hematological: Negative.   Psychiatric/Behavioral: Negative.   All other systems reviewed and are negative.    Allergies  Review of patient's allergies indicates no known allergies.  Home Medications   Current Outpatient Rx  Name  Route  Sig  Dispense  Refill  . GOODY HEADACHE PO   Oral   Take 1 Package by mouth 2 (two) times daily as needed. PAIN         . MORPHINE SULFATE ER 100 MG PO TB12   Oral   Take 100 mg by mouth every 4 (four) hours as needed. For pain         . MORPHINE SULFATE 30 MG PO TABS   Oral   Take 30 mg by mouth every 4 (four) hours as needed. For pain            BP 121/64  Pulse 84  Temp  98.3 F (36.8 C) (Oral)  Resp 24  SpO2 96%  LMP 04/22/2012  Physical Exam  Nursing note and vitals reviewed. Constitutional: She appears well-developed and well-nourished. She appears distressed.       Appears uncomfortable, tearful  HENT:  Head: Normocephalic and atraumatic.  Eyes: Conjunctivae normal are normal. Pupils are equal, round, and reactive to light.  Neck: Neck supple. No tracheal deviation present. No thyromegaly present.  Cardiovascular: Normal rate and regular rhythm.   No murmur heard. Pulmonary/Chest: Effort normal and breath sounds normal.  Abdominal: Soft. Bowel sounds are normal. She exhibits no distension. There is no tenderness.  Musculoskeletal: Normal range of motion. She exhibits no edema and no tenderness.       All 4 extremities without swelling redness or deformity, neurovascularly intact. She is  tender right anterior thigh  Neurological: She is alert. Coordination normal.  Skin: Skin is warm and dry. No rash noted.  Psychiatric: She has a normal mood and affect.    ED Course  Procedures (including critical care time)   Labs Reviewed  CBC WITH DIFFERENTIAL  BASIC METABOLIC PANEL  RETICULOCYTES  No results found. Results for orders placed during the hospital encounter of 05/10/12  CBC WITH DIFFERENTIAL      Component Value Range   WBC 12.5 (*) 4.0 - 10.5 K/uL   RBC 3.57 (*) 3.87 - 5.11 MIL/uL   Hemoglobin 10.9 (*) 12.0 - 15.0 g/dL   HCT 96.0 (*) 45.4 - 09.8 %   MCV 82.6  78.0 - 100.0 fL   MCH 30.5  26.0 - 34.0 pg   MCHC 36.9 (*) 30.0 - 36.0 g/dL   RDW 11.9  14.7 - 82.9 %   Platelets 477 (*) 150 - 400 K/uL   Neutrophils Relative 54  43 - 77 %   Lymphocytes Relative 40  12 - 46 %   Monocytes Relative 5  3 - 12 %   Eosinophils Relative 1  0 - 5 %   Basophils Relative 0  0 - 1 %   Band Neutrophils 0  0 - 10 %   Metamyelocytes Relative 0     Myelocytes 0     Promyelocytes Absolute 0     Blasts 0     nRBC 0  0 /100 WBC   Neutro Abs 6.8   1.7 - 7.7 K/uL   Lymphs Abs 5.0 (*) 0.7 - 4.0 K/uL   Monocytes Absolute 0.6  0.1 - 1.0 K/uL   Eosinophils Absolute 0.1  0.0 - 0.7 K/uL   Basophils Absolute 0.0  0.0 - 0.1 K/uL   RBC Morphology POLYCHROMASIA PRESENT     Smear Review OVALOCYTES    BASIC METABOLIC PANEL      Component Value Range   Sodium 139  135 - 145 mEq/L   Potassium 4.2  3.5 - 5.1 mEq/L   Chloride 105  96 - 112 mEq/L   CO2 28  19 - 32 mEq/L   Glucose, Bld 117 (*) 70 - 99 mg/dL   BUN 4 (*) 6 - 23 mg/dL   Creatinine, Ser 5.62  0.50 - 1.10 mg/dL   Calcium 9.4  8.4 - 13.0 mg/dL   GFR calc non Af Amer >90  >90 mL/min   GFR calc Af Amer >90  >90 mL/min  RETICULOCYTES      Component Value Range   Retic Ct Pct 6.2 (*) 0.4 - 3.1 %   RBC. 3.57 (*) 3.87 - 5.11 MIL/uL   Retic Count, Manual 221.3 (*) 19.0 - 186.0 K/uL   No results found.   No diagnosis found. At 8:15 PM patient is pain is not well controlled after several doses of hydromorphone intravenously. Intravenous Toradol ordered.   MDM  Spoke with Dr. Irene Limbo Plan transfer to sickle cell unit Diagnosis acute sickle cell crisis        Doug Sou, MD 05/10/12 2019

## 2012-05-10 NOTE — H&P (Addendum)
[name] Vanderford is an 42 y.o. female  PCP: Dr. Ronne Binning CC: right leg pain unrelieved by normal pain med regimen HPI: Ms. Sedlack is a 42 yo AAF with a history of sickle cell anemia. Her PMHx is benign other than this illess. She presents to the [facility] ED today with right leg pain which is unrelieved at home by her normal daily pain medications. States her goal pain level at home is 0/10 but since 11am today, has been 9-10/10.  She denies any chest pain, fever, cold or flu like sx, HA, cough, sputum production, n/v/d or abd pain.  She denies any hx of admission to the sickle cell center here and states she has never had a blood transfusion. She doesn't take Hydroxyurea at home.  Therefore, because of her uncontrolled pain on her normal pain med regimen, [facility] is asked to admit. After reviewing chart, labs, and CXR, she is deemed to be an appropriate candidate for 23 hr OBS at [facility]. She agrees with plan.    Past Medical History  Diagnosis Date  . Sickle cell crisis     Past Surgical History  Procedure Date  . Appendectomy   . Other surgical history     c-section  . Cesarean section     FMHx: Father deceased from a GSW. Mother alive and well. Denies any other hx.  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 She does not have any smokeless tobacco history on file. She reports that she drinks alcohol. She reports that she does not use illicit drugs. Alcohol consumption is rare.   Allergies: No Known Allergies  Medications Prior to Admission  Medication Sig Dispense Refill  . Aspirin-Acetaminophen-Caffeine (GOODY HEADACHE PO) Take 1 Package by mouth 2 (two) times daily as needed. PAIN      . morphine (MS CONTIN) 100 MG 12 hr tablet Take 100 mg by mouth every 4 (four) hours as needed. For pain      . morphine (MSIR) 30 MG tablet Take 30 mg by mouth every 4 (four) hours as needed. For pain          Results for orders placed during the hospital encounter of 05/10/12 (from the past 48 hour(s))  CBC WITH DIFFERENTIAL     Status: Abnormal   Collection Time   05/10/12  5:30 PM      Component Value Range Comment   WBC 12.5 (*) 4.0 - 10.5 K/uL    RBC 3.57 (*) 3.87 - 5.11 MIL/uL    Hemoglobin 10.9 (*) 12.0 - 15.0 g/dL    HCT 28.4 (*) 13.2 - 46.0 %    MCV 82.6  78.0 - 100.0 fL    MCH 30.5  26.0 - 34.0 pg    MCHC 36.9 (*) 30.0 - 36.0 g/dL    RDW 44.0  10.2 - 72.5 %    Platelets 477 (*) 150 - 400 K/uL    Neutrophils Relative 54  43 - 77 %    Lymphocytes Relative 40  12 - 46 %    Monocytes Relative 5  3 - 12 %    Eosinophils Relative 1  0 - 5 %    Basophils Relative 0  0 - 1 %    Band Neutrophils 0  0 - 10 %    Metamyelocytes Relative 0      Myelocytes 0      Promyelocytes Absolute  0      Blasts 0      nRBC 0  0 /100 WBC    Neutro Abs 6.8  1.7 - 7.7 K/uL    Lymphs Abs 5.0 (*) 0.7 - 4.0 K/uL    Monocytes Absolute 0.6  0.1 - 1.0 K/uL    Eosinophils Absolute 0.1  0.0 - 0.7 K/uL    Basophils Absolute 0.0  0.0 - 0.1 K/uL    RBC Morphology POLYCHROMASIA PRESENT   TARGET CELLS   Smear Review OVALOCYTES   LARGE PLATELETS PRESENT  BASIC METABOLIC PANEL     Status: Abnormal   Collection Time   05/10/12  5:30 PM      Component Value Range Comment   Sodium 139  135 - 145 mEq/L    Potassium 4.2  3.5 - 5.1 mEq/L    Chloride 105  96 - 112 mEq/L    CO2 28  19 - 32 mEq/L    Glucose, Bld 117 (*) 70 - 99 mg/dL    BUN 4 (*) 6 - 23 mg/dL    Creatinine, Ser 4.09  0.50 - 1.10 mg/dL    Calcium 9.4  8.4 - 81.1 mg/dL    GFR calc non Af Amer >90  >90 mL/min    GFR calc Af Amer >90  >90 mL/min   RETICULOCYTES     Status: Abnormal   Collection Time   05/10/12  5:30 PM      Component Value Range Comment   Retic Ct Pct 6.2 (*) 0.4 - 3.1 %    RBC. 3.57 (*) 3.87 - 5.11 MIL/uL    Retic Count, Manual 221.3 (*) 19.0 - 186.0 K/uL   HEPATIC FUNCTION PANEL     Status: Abnormal   Collection Time   05/10/12   5:30 PM      Component Value Range Comment   Total Protein 7.5  6.0 - 8.3 g/dL    Albumin 4.0  3.5 - 5.2 g/dL    AST 25  0 - 37 U/L    ALT 13  0 - 35 U/L    Alkaline Phosphatase 38 (*) 39 - 117 U/L    Total Bilirubin 0.9  0.3 - 1.2 mg/dL    Bilirubin, Direct 0.2  0.0 - 0.3 mg/dL SLIGHT HEMOLYSIS   Indirect Bilirubin 0.7  0.3 - 0.9 mg/dL    Dg Chest 2 View  01/07/4781  *RADIOLOGY REPORT*  Clinical Data: Pain.  Sickle cell crisis.  CHEST - 2 VIEW  Comparison: 08/08/2010  Findings: Heart size and vascularity are normal and the lungs are clear.  No acute osseous abnormality.  IMPRESSION: No acute disease in the chest.   Original Report Authenticated By: Francene Boyers, M.D.     Review of Systems  Constitutional: Negative for fever, chills, weight loss, malaise/fatigue and diaphoresis.  HENT: Negative for ear pain, congestion and sore throat.   Eyes: Negative for blurred vision, double vision and discharge.  Respiratory: Negative for cough, sputum production, shortness of breath and wheezing.   Cardiovascular: Negative for chest pain, palpitations, orthopnea, leg swelling and PND.  Gastrointestinal: Negative for heartburn, nausea, vomiting, abdominal pain, diarrhea and constipation.  Genitourinary: Negative for dysuria.  Musculoskeletal: Positive for joint pain (right leg pain). Negative for myalgias.  Skin: Negative for itching and rash.  Neurological: Negative for dizziness, tingling, sensory change, speech change, focal weakness, weakness and headaches.  Endo/Heme/Allergies: Does not bruise/bleed easily.  Psychiatric/Behavioral: Negative for depression. The patient is not nervous/anxious.  Last menstrual period 04/22/2012.  Vital signs: reviewed and within normal limits. O2 sat normal on RA.   Physical Exam  Constitutional: She is oriented to person, place, and time. She appears well-developed and well-nourished. No distress.       Tearful, lying on stretcher grimacing in pain    HENT:  Head: Normocephalic and atraumatic.  Right Ear: External ear normal.  Left Ear: External ear normal.  Mouth/Throat: Oropharynx is clear and moist. No oropharyngeal exudate.  Eyes: Conjunctivae normal and EOM are normal. Pupils are equal, round, and reactive to light. Right eye exhibits no discharge. Left eye exhibits no discharge. No scleral icterus.  Neck: Normal range of motion. Neck supple. No thyromegaly present.  Cardiovascular: Normal rate and regular rhythm.  Exam reveals no gallop and no friction rub.   No murmur heard. Respiratory: Effort normal and breath sounds normal. No respiratory distress. She has no wheezes. She has no rales. She exhibits no tenderness.  GI: Soft. Bowel sounds are normal. She exhibits no distension. There is no tenderness.  Genitourinary:       deferred  Musculoskeletal: Normal range of motion. She exhibits tenderness (right thigh). She exhibits no edema.  Lymphadenopathy:    She has no cervical adenopathy.  Neurological: She is alert and oriented to person, place, and time. No cranial nerve deficit. She exhibits normal muscle tone. Coordination normal.  Skin: Skin is warm and dry. No rash noted. She is not diaphoretic. No erythema.  Psychiatric: She has a normal mood and affect.     Assessment/Plan 1.  Sickle cell anemia with pain crisis-The patient is having no acute sx other than her pain unrelieved at home. Labs reviewed and Hgb is at baseline, CXR is negative for indications of acute chest syndrome, pregnancy test is neg., electrolytes and kidney fx are fine and her bili is normal. Will admit to outpt sickle cell center for 23 hr OBS. Cont home pain meds. Hold Goody powders. Add Dilaudid IV and Toradol IV prn and hydrate well. Recheck electrolytes and H/H in am. Care to be assumed by Dr. August Saucer in am.  2. Leukocytosis-no signs of infection, likely stress demargination.   Pt also seen by Dr. Doran Stabler who will cosign note.  Admission time: 30  mins.  Tereso Newcomer, KAREN 05/10/2012, 10:08 PM

## 2012-05-10 NOTE — H&P (Signed)
Patient seen, independently examined, data reviewed and care discussed with [name]. I agree with her exam, assessment and plan.  Lindsey [name] presents to the emergency department with right thigh pain consistent with previous crisis pain. He was refractory to her usual pain regimen at home. She denies any other pain or other symptoms.  Pain typical of her crisis pain. Vital signs stable. Laboratory workup unremarkable. No history to suggest complication of acute sickle cell pain crisis. Admit for pain control, IV fluids.  Brendia Sacks, MD [facility] [phone]

## 2012-05-10 NOTE — ED Notes (Signed)
Tech picking up pt for SSC at this time

## 2012-05-10 NOTE — H&P (Deleted)
error 

## 2012-05-10 NOTE — ED Notes (Signed)
Pt reports SSC pain since this am in R leg. Pt tearful in triage. Sts she has not has a crisis "in a while." Took home pain meds without relief.

## 2012-05-10 NOTE — ED Notes (Signed)
[facility] stated they need a chest x-ray for pt being new to them.  EDP aware and placed orders.

## 2012-05-11 NOTE — Progress Notes (Signed)
Pt has requested to discharge home after only 2 hours of care. Notified hospitalist who states they would like pt to at least stay for hydration and am labs but if pt does not want to stay she can leave ama. Explained these concerns to the pt and encouraged her to stay for a few more hours but pt states, "I can manage at home and I would like to get in my bed, so I want to go home." AMA form signed and discharge instructions given. Hospitalist aware that pt has decided to go home.

## 2012-05-11 NOTE — Progress Notes (Signed)
Patient ID: Lindsey [name], female   DOB: 1970-09-09, 42 y.o.   MRN: [id number] IV removed with catheter in tact. Pt states her pain is manageable at home despite only receiving 2 hours of care. Pain 2 out of 10 at time of discharge. Transported via wheelchair by associate with spouse present. All belongings with pt. Encouraged to call with any other questions/concerns. Verbalized understanding regarding leaving ama status.

## 2012-05-11 NOTE — Progress Notes (Signed)
Approximately 1-2 hours after being admitted to the OBS sickle cell center, the pt insisted on going home. She was advised to stay to get fluids and have labs rechecked in a.m.  Pt refused and left AMA. Jimmye Norman, NP [facility]

## 2012-07-18 ENCOUNTER — Encounter (HOSPITAL_COMMUNITY): Payer: Self-pay | Admitting: *Deleted

## 2012-07-18 ENCOUNTER — Emergency Department (HOSPITAL_COMMUNITY)
Admission: EM | Admit: 2012-07-18 | Discharge: 2012-07-19 | Disposition: A | Discharge status: Home / Self Care | Payer: Self-pay | Attending: Emergency Medicine | Admitting: Emergency Medicine

## 2012-07-18 DIAGNOSIS — M25559 Pain in unspecified hip: Secondary | ICD-10-CM | POA: Insufficient documentation

## 2012-07-18 DIAGNOSIS — D5700 Hb-SS disease with crisis, unspecified: Secondary | ICD-10-CM | POA: Insufficient documentation

## 2012-07-18 LAB — CBC WITH DIFFERENTIAL/PLATELET
Basophils Absolute: 0 10*3/uL (ref 0.0–0.1)
Basophils Relative: 0 % (ref 0–1)
Eosinophils Absolute: 0.6 10*3/uL (ref 0.0–0.7)
Eosinophils Relative: 4 % (ref 0–5)
HCT: 27.7 % — ABNORMAL LOW (ref 36.0–46.0)
Hemoglobin: 9.8 g/dL — ABNORMAL LOW (ref 12.0–15.0)
Lymphocytes Relative: 43 % (ref 12–46)
Lymphs Abs: 5.9 10*3/uL — ABNORMAL HIGH (ref 0.7–4.0)
MCH: 29.4 pg (ref 26.0–34.0)
MCHC: 35.4 g/dL (ref 30.0–36.0)
MCV: 83.2 fL (ref 78.0–100.0)
Monocytes Absolute: 0.8 10*3/uL (ref 0.1–1.0)
Monocytes Relative: 6 % (ref 3–12)
Neutro Abs: 6.5 10*3/uL (ref 1.7–7.7)
Neutrophils Relative %: 47 % (ref 43–77)
Platelets: 506 10*3/uL — ABNORMAL HIGH (ref 150–400)
RBC: 3.33 MIL/uL — ABNORMAL LOW (ref 3.87–5.11)
RDW: 15.1 % (ref 11.5–15.5)
WBC: 13.8 10*3/uL — ABNORMAL HIGH (ref 4.0–10.5)

## 2012-07-18 LAB — BASIC METABOLIC PANEL
BUN: 8 mg/dL (ref 6–23)
CO2: 28 mEq/L (ref 19–32)
Calcium: 9.1 mg/dL (ref 8.4–10.5)
Chloride: 104 mEq/L (ref 96–112)
Creatinine, Ser: 0.75 mg/dL (ref 0.50–1.10)
GFR calc Af Amer: >90 mL/min (ref >90)
GFR calc non Af Amer: >90 mL/min (ref >90)
Glucose, Bld: 92 mg/dL (ref 70–99)
Potassium: 4 mEq/L (ref 3.5–5.1)
Sodium: 139 mEq/L (ref 135–145)

## 2012-07-18 LAB — RETICULOCYTES
RBC.: 3.33 MIL/uL — ABNORMAL LOW (ref 3.87–5.11)
Retic Count, Absolute: 169.8 10*3/uL (ref 19.0–186.0)
Retic Ct Pct: 5.1 % — ABNORMAL HIGH (ref 0.4–3.1)

## 2012-07-18 MED ORDER — LORAZEPAM 2 MG/ML IJ SOLN
1.0000 mg | Freq: Once | INTRAMUSCULAR | Status: AC
  Administered 2012-07-18: 1 mg via INTRAVENOUS
  Filled 2012-07-18: qty 1

## 2012-07-18 MED ORDER — ONDANSETRON HCL 4 MG/2ML IJ SOLN
INTRAMUSCULAR | Status: AC
  Filled 2012-07-18: qty 2

## 2012-07-18 MED ORDER — ONDANSETRON HCL 4 MG/2ML IJ SOLN
4.0000 mg | Freq: Once | INTRAMUSCULAR | Status: AC
  Administered 2012-07-18: 21:00:00 via INTRAVENOUS

## 2012-07-18 MED ORDER — KETOROLAC TROMETHAMINE 30 MG/ML IJ SOLN
30.0000 mg | Freq: Once | INTRAMUSCULAR | Status: AC
  Administered 2012-07-18: 30 mg via INTRAVENOUS
  Filled 2012-07-18: qty 1

## 2012-07-18 MED ORDER — HYDROMORPHONE HCL PF 1 MG/ML IJ SOLN
1.0000 mg | Freq: Once | INTRAMUSCULAR | Status: AC
  Administered 2012-07-18: 1 mg via INTRAVENOUS
  Filled 2012-07-18: qty 1

## 2012-07-18 MED ORDER — SODIUM CHLORIDE 0.9 % IV SOLN
Freq: Once | INTRAVENOUS | Status: AC
  Administered 2012-07-18: 22:00:00 via INTRAVENOUS

## 2012-07-18 MED ORDER — HYDROMORPHONE HCL PF 2 MG/ML IJ SOLN
2.0000 mg | Freq: Once | INTRAMUSCULAR | Status: AC
  Administered 2012-07-18: 2 mg via INTRAVENOUS
  Filled 2012-07-18: qty 1

## 2012-07-18 MED ORDER — FENTANYL CITRATE 0.05 MG/ML IJ SOLN
50.0000 ug | Freq: Once | INTRAMUSCULAR | Status: AC
  Administered 2012-07-18: 50 ug via INTRAVENOUS
  Filled 2012-07-18: qty 2

## 2012-07-18 NOTE — ED Notes (Signed)
Pt crying in triage; c/o sickle cell pain since this morning; severe leg pain

## 2012-07-18 NOTE — ED Notes (Signed)
[id number]<AV> Expected date:<BR> Expected time:<BR> Means of arrival:<BR> Comments:<BR> Triage 1

## 2012-07-18 NOTE — ED Notes (Signed)
Attempted to start IV x 2 without successful.

## 2012-07-18 NOTE — ED Notes (Signed)
Pt c/o "sickle cell crisis" pain all day; pain to bilateral lower extremities; pt tearful and crying from pain; denies N/V or difficulty breathing or chest pain.

## 2012-07-20 ENCOUNTER — Encounter (HOSPITAL_COMMUNITY): Payer: Self-pay | Admitting: *Deleted

## 2012-07-20 ENCOUNTER — Inpatient Hospital Stay (HOSPITAL_COMMUNITY)
Admission: EM | Admit: 2012-07-20 | Discharge: 2012-07-21 | DRG: 812 | Disposition: A | Discharge status: Home / Self Care | Payer: MEDICAID | Attending: Internal Medicine | Admitting: Internal Medicine

## 2012-07-20 ENCOUNTER — Emergency Department (HOSPITAL_COMMUNITY): Payer: Self-pay

## 2012-07-20 DIAGNOSIS — Z79899 Other long term (current) drug therapy: Secondary | ICD-10-CM

## 2012-07-20 DIAGNOSIS — R509 Fever, unspecified: Secondary | ICD-10-CM

## 2012-07-20 DIAGNOSIS — Z7982 Long term (current) use of aspirin: Secondary | ICD-10-CM

## 2012-07-20 DIAGNOSIS — Z9089 Acquired absence of other organs: Secondary | ICD-10-CM

## 2012-07-20 DIAGNOSIS — D5700 Hb-SS disease with crisis, unspecified: Principal | ICD-10-CM | POA: Diagnosis present

## 2012-07-20 DIAGNOSIS — R5081 Fever presenting with conditions classified elsewhere: Secondary | ICD-10-CM | POA: Diagnosis present

## 2012-07-20 LAB — COMPREHENSIVE METABOLIC PANEL
ALT: 13 U/L (ref 0–35)
AST: 20 U/L (ref 0–37)
CO2: 24 mEq/L (ref 19–32)
Chloride: 104 mEq/L (ref 96–112)
GFR calc Af Amer: >90 mL/min (ref >90)
GFR calc non Af Amer: >90 mL/min (ref >90)
Glucose, Bld: 97 mg/dL (ref 70–99)
Sodium: 139 mEq/L (ref 135–145)
Total Bilirubin: 1.1 mg/dL (ref 0.3–1.2)

## 2012-07-20 LAB — CBC WITH DIFFERENTIAL/PLATELET
Basophils Relative: 0 % (ref 0–1)
Eosinophils Relative: 2 % (ref 0–5)
Hemoglobin: 10.5 g/dL — ABNORMAL LOW (ref 12.0–15.0)
Lymphs Abs: 4.5 10*3/uL — ABNORMAL HIGH (ref 0.7–4.0)
MCH: 30.3 pg (ref 26.0–34.0)
MCV: 83.5 fL (ref 78.0–100.0)
Monocytes Relative: 6 % (ref 3–12)
Neutrophils Relative %: 62 % (ref 43–77)
Platelets: 547 10*3/uL — ABNORMAL HIGH (ref 150–400)
RBC: 3.46 MIL/uL — ABNORMAL LOW (ref 3.87–5.11)
WBC: 15 10*3/uL — ABNORMAL HIGH (ref 4.0–10.5)

## 2012-07-20 LAB — URINE MICROSCOPIC-ADD ON

## 2012-07-20 LAB — URINALYSIS, ROUTINE W REFLEX MICROSCOPIC
Bilirubin Urine: NEGATIVE
Glucose, UA: NEGATIVE mg/dL
Hgb urine dipstick: NEGATIVE
Specific Gravity, Urine: 1.012 (ref 1.005–1.030)
pH: 7.5 (ref 5.0–8.0)

## 2012-07-20 MED ORDER — ENOXAPARIN SODIUM 40 MG/0.4ML ~~LOC~~ SOLN
40.0000 mg | SUBCUTANEOUS | Status: DC
  Administered 2012-07-20: 40 mg via SUBCUTANEOUS
  Filled 2012-07-20 (×2): qty 0.4

## 2012-07-20 MED ORDER — SODIUM CHLORIDE 0.9 % IV BOLUS (SEPSIS)
1000.0000 mL | Freq: Once | INTRAVENOUS | Status: AC
  Administered 2012-07-20: 1000 mL via INTRAVENOUS

## 2012-07-20 MED ORDER — MORPHINE SULFATE 30 MG PO TABS
30.0000 mg | ORAL_TABLET | ORAL | Status: DC | PRN
  Administered 2012-07-20: 30 mg via ORAL
  Filled 2012-07-20: qty 1

## 2012-07-20 MED ORDER — ACETAMINOPHEN 325 MG PO TABS: 650.0000 mg | ORAL_TABLET | Freq: Four times a day (QID) | ORAL | Status: DC | PRN

## 2012-07-20 MED ORDER — HYDROMORPHONE HCL PF 1 MG/ML IJ SOLN
1.0000 mg | Freq: Once | INTRAMUSCULAR | Status: AC
  Administered 2012-07-20: 1 mg via INTRAVENOUS
  Filled 2012-07-20: qty 1

## 2012-07-20 MED ORDER — ONDANSETRON HCL 4 MG/2ML IJ SOLN: 4.0000 mg | Freq: Three times a day (TID) | INTRAMUSCULAR | Status: DC | PRN

## 2012-07-20 MED ORDER — ACETAMINOPHEN 650 MG RE SUPP: 650.0000 mg | Freq: Four times a day (QID) | RECTAL | Status: DC | PRN

## 2012-07-20 MED ORDER — KETOROLAC TROMETHAMINE 15 MG/ML IJ SOLN
30.0000 mg | Freq: Four times a day (QID) | INTRAMUSCULAR | Status: DC | PRN
  Administered 2012-07-20: 30 mg via INTRAVENOUS
  Filled 2012-07-20: qty 2

## 2012-07-20 MED ORDER — HYDROMORPHONE HCL PF 1 MG/ML IJ SOLN: 1.0000 mg | INTRAMUSCULAR | Status: AC | PRN

## 2012-07-20 MED ORDER — HYDROMORPHONE HCL PF 2 MG/ML IJ SOLN
2.0000 mg | INTRAMUSCULAR | Status: DC | PRN
  Administered 2012-07-20 – 2012-07-21 (×3): 2 mg via INTRAVENOUS
  Filled 2012-07-20 (×3): qty 1

## 2012-07-20 MED ORDER — KETOROLAC TROMETHAMINE 30 MG/ML IJ SOLN
30.0000 mg | Freq: Once | INTRAMUSCULAR | Status: AC
  Administered 2012-07-20: 30 mg via INTRAVENOUS
  Filled 2012-07-20: qty 1

## 2012-07-20 MED ORDER — SODIUM CHLORIDE 0.9 % IV SOLN: INTRAVENOUS | Status: AC

## 2012-07-20 MED ORDER — FENTANYL CITRATE 0.05 MG/ML IJ SOLN
100.0000 ug | Freq: Once | INTRAMUSCULAR | Status: AC
  Administered 2012-07-20: 100 ug via INTRAVENOUS
  Filled 2012-07-20: qty 2

## 2012-07-20 MED ORDER — ONDANSETRON HCL 4 MG/2ML IJ SOLN: 4.0000 mg | Freq: Four times a day (QID) | INTRAMUSCULAR | Status: DC | PRN

## 2012-07-20 MED ORDER — ACETAMINOPHEN 500 MG PO TABS
1000.0000 mg | ORAL_TABLET | Freq: Once | ORAL | Status: AC
  Administered 2012-07-20: 1000 mg via ORAL
  Filled 2012-07-20: qty 2

## 2012-07-20 MED ORDER — SODIUM CHLORIDE 0.9 % IV SOLN
INTRAVENOUS | Status: DC
  Administered 2012-07-20 (×3): via INTRAVENOUS
  Administered 2012-07-21: 1000 mL via INTRAVENOUS

## 2012-07-20 MED ORDER — SENNA 8.6 MG PO TABS
1.0000 | ORAL_TABLET | Freq: Two times a day (BID) | ORAL | Status: DC
  Administered 2012-07-20 (×2): 8.6 mg via ORAL
  Filled 2012-07-20 (×2): qty 1

## 2012-07-20 MED ORDER — FENTANYL CITRATE 0.05 MG/ML IJ SOLN
50.0000 ug | Freq: Once | INTRAMUSCULAR | Status: AC
  Administered 2012-07-20: 50 ug via INTRAVENOUS
  Filled 2012-07-20: qty 2

## 2012-07-20 NOTE — Care Management (Signed)
CARE MANAGEMENT NOTE 07/20/2012  Patient:  Lindsey [name],Lindsey [name]   Account Number:  [id number]  Date Initiated:  07/20/2012  Documentation initiated by:  Ferrin,TYMEEKA  Subjective/Objective Assessment:   42 yo female admitted with sickle cell crisis. PTA pt independent. Spouse present at bedside.     Action/Plan:   Home when stable   Anticipated DC Date:     Anticipated DC Plan:           Choice offered to / List presented to:             Status of service:  In process, will continue to follow Medicare Important Message given?   (If response is "NO", the following Medicare IM given date fields will be blank) Date Medicare IM given:   Date Additional Medicare IM given:    Discharge Disposition:    Per UR Regulation:    If discussed at Long Length of Stay Meetings, dates discussed:    Comments:  07/20/12 1422 Lindsey [name], Lindsey [name] [phone] Cm spoke with financial counselor concerning consult for medicaid.

## 2012-07-20 NOTE — H&P (Signed)
[facility] History and Physical  Lindsey [name] ZOX:[id number] DOB: 10-Aug-1970 DOA: 07/20/2012  Referring physician: Dr. Silverio Lay PCP: Lieutenant Diego, MD  Chief Complaint: Left hip and leg pain since 2 days  HPI:  42 year old female with history of sickle cell disease presents with 2 day history off severe pain over left hip and leg which is described similar to her sickle cell pain crisis. Patient was admitted in January 2014 with similar sickle cell pain but signed out AMA. Patient had severe pain over her left hip and and thigh extending up to the knee 2 days back which was not relieved with her home pain medications. She denies having any fever, chills, chest pain, palpitations, other joint pains, blurry vision, dizziness, nausea, vomiting, abdominal pain, bowel or urinary symptoms.  Course in the ED Patient was given 100 mcg followed by 50  Mcg of IV fentanyl, that was given 2 mg of IV Dilaudid followed by 30 mg of IV Toradol which will order to improve her symptoms of pain. Blood work done showed a leukocytosis which seems at baseline. Urinalysis suggestive of UTI however patient was asymptomatic. Patient did have a temperature of 101.6. An x-ray of the chest was unremarkable. X-ray of the hip was done without any acute findings. [facility] called to admit patient for observation for sickle cell pain crisis.  Review of Systems:  Constitutional: Denies fever, chills, diaphoresis, appetite change and fatigue.  HEENT: Denies photophobia, eye pain, redness, hearing loss, ear pain, congestion, sore throat, rhinorrhea, sneezing, mouth sores, trouble swallowing, neck pain, neck stiffness and tinnitus.   Respiratory: Denies SOB, DOE, cough, chest tightness,  and wheezing.   Cardiovascular: Denies chest pain, palpitations and leg swelling.  Gastrointestinal: Denies nausea, vomiting, abdominal pain, diarrhea, constipation, blood in stool and abdominal distention.  Genitourinary: Denies  dysuria, urgency, frequency, hematuria, flank pain and difficulty urinating.  Musculoskeletal: Severe pain over left hip and thigh extending to the knees.. Denies myalgias, back pain, joint swelling, arthralgias and gait problem.  Skin: Denies pallor, rash and wound.  Neurological: Denies dizziness, seizures, syncope, weakness, light-headedness, numbness and headaches.  Hematological: Denies adenopathy. Easy bruising, personal or family bleeding history  Psychiatric/Behavioral: Denies suicidal ideation, mood changes, confusion, nervousness, sleep disturbance and agitation   Past Medical History  Diagnosis Date  . Sickle cell crisis    Past Surgical History  Procedure Laterality Date  . Appendectomy    . Other surgical history      c-section  . Cesarean section     Social History:  reports that she has never smoked. She has never used smokeless tobacco. She reports that  drinks alcohol. She reports that she does not use illicit drugs.  No Known Allergies  History reviewed. No pertinent family history.  Prior to Admission medications   Medication Sig Start Date End Date Taking? Authorizing Provider  Aspirin-Acetaminophen-Caffeine (GOODY HEADACHE PO) Take 1 Package by mouth 2 (two) times daily as needed. PAIN   Yes Historical Provider, MD  morphine (MS CONTIN) 100 MG 12 hr tablet Take 100 mg by mouth every 4 (four) hours as needed. For pain   Yes Historical Provider, MD  morphine (MSIR) 30 MG tablet Take 30 mg by mouth every 4 (four) hours as needed. For pain    Yes Historical Provider, MD    Physical Exam:  Filed Vitals:   07/20/12 0635 07/20/12 1000 07/20/12 1015 07/20/12 1053  BP: 121/58  97/56 98/65  Pulse: 74 58 72 68  Temp: 101.6 F (  38.7 C)  98.5 F (36.9 C) 98 F (36.7 C)  TempSrc: Oral  Oral Oral  Resp: 26 11 12 16   Height:    5' (1.524 m)  Weight:    67.132 kg (148 lb)  SpO2: 100% 100% 100% 99%    Constitutional: Vital signs reviewed.  Patient is a  well-developed and well-nourished in no acute distress and cooperative with exam. Alert and oriented x3.  Head: Normocephalic and atraumatic Ear: TM normal bilaterally Mouth: no erythema or exudates, MMM Eyes: PERRL, EOMI, conjunctivae normal, No scleral icterus.  Neck: Supple, Trachea midline normal ROM, No JVD, mass, thyromegaly, or carotid bruit present.  Cardiovascular: RRR, S1 normal, S2 normal, no MRG, pulses symmetric and intact bilaterally Pulmonary/Chest: CTAB, no wheezes, rales, or rhonchi Abdominal: Soft. Non-tender, non-distended, bowel sounds are normal, no masses, organomegaly, or guarding present.  GU: no CVA tenderness Musculoskeletal: No joint deformities, erythema, or stiffness, some tenderness to palpation over left eye and painful range of motion over left hip . No erythema or swelling noted  Ext: no edema and no cyanosis, pulses palpable bilaterally (DP and PT) Hematology: no cervical, inginal, or axillary adenopathy.  Neurological: A&O x3, Strenght is normal and symmetric bilaterally, cranial nerve II-XII are grossly intact, no focal motor deficit, sensory intact to light touch bilaterally.  Skin: Warm, dry and intact. No rash, cyanosis, or clubbing.  Psychiatric: Normal mood and affect. speech and behavior is normal. Judgment and thought content normal. Cognition and memory are normal.   Labs on Admission:  Basic Metabolic Panel:  Recent Labs Lab 07/18/12 2000 07/20/12 0648  NA 139 139  K 4.0 3.7  CL 104 104  CO2 28 24  GLUCOSE 92 97  BUN 8 4*  CREATININE 0.75 0.70  CALCIUM 9.1 9.0   Liver Function Tests:  Recent Labs Lab 07/20/12 0648  AST 20  ALT 13  ALKPHOS 46  BILITOT 1.1  PROT 7.4  ALBUMIN 3.7   No results found for this basename: LIPASE, AMYLASE,  in the last 168 hours No results found for this basename: AMMONIA,  in the last 168 hours CBC:  Recent Labs Lab 07/18/12 2000 07/20/12 0648  WBC 13.8* 15.0*  NEUTROABS 6.5 9.3*  HGB 9.8*  10.5*  HCT 27.7* 28.9*  MCV 83.2 83.5  PLT 506* 547*   Cardiac Enzymes: No results found for this basename: CKTOTAL, CKMB, CKMBINDEX, TROPONINI,  in the last 168 hours BNP: No components found with this basename: POCBNP,  CBG: No results found for this basename: GLUCAP,  in the last 168 hours  Radiological Exams on Admission: Dg Chest 2 View  07/20/2012  *RADIOLOGY REPORT*  Clinical Data: History of sickle cell crisis.  CHEST - 2 VIEW  Comparison: 05/10/2012.  08/08/2010.  Findings: Cardiac silhouette is normal size and shape.  Mediastinal and hilar contours appear stable.  There is slight nonspecific prominence of reticular interstitial markings without evidence of acute airspace disease, consolidation, or pleural effusion.  There is slight flattening of diaphragm on lateral image which may reflect minimal hyperinflation configuration.  No skeletal lesions are seen.  IMPRESSION: Slight nonspecific prominence of reticular interstitial markings. No acute airspace disease, consolidation, or pleural effusion.   Original Report Authenticated By: Onalee Hua Call    Dg Hip Complete Left  07/20/2012  *RADIOLOGY REPORT*  Clinical Data: Sickle cell crisis with pain all in the left hip and leg.  LEFT HIP - COMPLETE 2+ VIEW  Comparison: None.  Findings: Prominence of stool throughout the  colon suggests constipation.  Degenerative arthropathy of both hips noted with spurring of the femoral heads and acetabula.  No specific radiographic findings of avascular necrosis.  IMPRESSION:  1. Prominence of stool throughout the colon suggests constipation. 2.  Degenerative arthropathy of both hips.   Original Report Authenticated By: Gaylyn Rong, M.D.    Dg Femur Left  07/20/2012  *RADIOLOGY REPORT*  Clinical Data: Sickle cell crisis.  Pain and left hip and leg.  LEFT FEMUR - 2 VIEW  Comparison: 07/20/2012  Findings: Faint sclerosis along the endosteum of the mid shaft of the femur could be a manifestation of chronic  infarct.  Otherwise negative.  IMPRESSION:  1.  Linear sclerosis along the endosteum of the mid shaft could be a manifestation of chronic infarct, although such appearances are more common in the metaphyses, and are usually more striking. Otherwise negative.   Original Report Authenticated By: Gaylyn Rong, M.D.       Assessment/Plan Principal Problem:   Sickle cell pain with crisis Admit to MedSurg under observation -Pain primarily involving the left hip and thigh. External -Noted for elevated reticulocyte count Patient given IV fentanyl and Dilaudid in the ED along with a dose of IV Toradol with minimal improvement. -Will start her on IV hydration with normal saline. Continue home dose of MSIR 30 mg every 6 hours as needed. I will place her on dilaudid  2 mg every 3 hours. Also had as needed Toradol.  -Zofran when necessary for nausea. -Sickle cell team will resume care from tomorrow -Check CBC in the morning  Active Problems:   Fever, unspecified -No clear sign of infection. Urine microscopic does suggest possible UTI although patient is a dramatic. Urine culture pending. Chest x-ray unremarkable. Order blood culture. Hold off on any antibiotics at this time. When necessary Tylenol for fever.   Diet: Regular DVT prophylaxis: Subcutaneous Lovenox  Code Status: Full Code Family Communication: Husband at bedside Disposition Plan: Home once pain controlled  Eddie North [facility] Pager [phone]  If 7PM-7AM, please contact night-coverage [email or web address] Password [facility] 07/20/2012, 11:22 AM     Total time spent on admission: 70 minutes

## 2012-07-20 NOTE — ED Notes (Signed)
Patient transported to X-ray 

## 2012-07-20 NOTE — ED Provider Notes (Signed)
History     CSN: [id number]  Arrival date & time 07/20/12  4098   First MD Initiated Contact with Patient 07/20/12 0700      Chief Complaint  Patient presents with  . Sickle Cell Pain Crisis    (Consider location/radiation/quality/duration/timing/severity/associated sxs/prior treatment) The history is provided by the patient and the spouse.  [name] Jemmott is a 42 y.o. female history sickle cell disease, here presenting with sickle cell crisis. She came in 2 days ago with left leg pain. She was treated in the ER and discharged. Since yesterday she's been having worse left leg pain. Denies any fevers or chills or chest pain. She took her MS Contin but didn't help. No history of osteomyelitis of the hip.    Past Medical History  Diagnosis Date  . Sickle cell crisis     Past Surgical History  Procedure Laterality Date  . Appendectomy    . Other surgical history      c-section  . Cesarean section      No family history on file.  History  Substance Use Topics  . Smoking status: Never Smoker   . Smokeless tobacco: Not on file  . Alcohol Use: Yes     Comment: occasionally    OB History   Grav Para Term Preterm Abortions TAB SAB Ect Mult Living                  Review of Systems  Musculoskeletal:       L thigh and leg pain   All other systems reviewed and are negative.    Allergies  Review of patient's allergies indicates no known allergies.  Home Medications   Current Outpatient Rx  Name  Route  Sig  Dispense  Refill  . Aspirin-Acetaminophen-Caffeine (GOODY HEADACHE PO)   Oral   Take 1 Package by mouth 2 (two) times daily as needed. PAIN         . morphine (MS CONTIN) 100 MG 12 hr tablet   Oral   Take 100 mg by mouth every 4 (four) hours as needed. For pain         . morphine (MSIR) 30 MG tablet   Oral   Take 30 mg by mouth every 4 (four) hours as needed. For pain            BP 121/58  Pulse 74  Temp(Src) 101.6 F (38.7 C) (Oral)  Resp 26   SpO2 100%  LMP 07/13/2012  Physical Exam  Nursing note and vitals reviewed. Constitutional: She is oriented to person, place, and time.  Uncomfortable, crying   HENT:  Head: Normocephalic.  Mouth/Throat: Oropharynx is clear and moist.  Eyes: Conjunctivae are normal. Pupils are equal, round, and reactive to light.  Neck: Normal range of motion. Neck supple.  Cardiovascular: Normal rate, regular rhythm and normal heart sounds.   Pulmonary/Chest: Effort normal and breath sounds normal. No respiratory distress. She has no wheezes. She has no rales.  Abdominal: Soft. Bowel sounds are normal. She exhibits no distension. There is no tenderness. There is no rebound and no guarding.  Musculoskeletal:  Mild tenderness over L thigh, nl ROM L hip. 2+ pulses, no calf tenderness. RLE exam unremarkable   Neurological: She is alert and oriented to person, place, and time.  Skin: Skin is warm and dry.  Psychiatric: She has a normal mood and affect. Her behavior is normal. Judgment and thought content normal.    ED Course  Procedures (including critical  care time)  Labs Reviewed  CBC WITH DIFFERENTIAL - Abnormal; Notable for the following:    WBC 15.0 (*)    RBC 3.46 (*)    Hemoglobin 10.5 (*)    HCT 28.9 (*)    MCHC 36.3 (*)    Platelets 547 (*)    Neutro Abs 9.3 (*)    Lymphs Abs 4.5 (*)    All other components within normal limits  COMPREHENSIVE METABOLIC PANEL - Abnormal; Notable for the following:    BUN 4 (*)    All other components within normal limits  RETICULOCYTES - Abnormal; Notable for the following:    Retic Ct Pct 5.5 (*)    RBC. 3.46 (*)    Retic Count, Manual 190.3 (*)    All other components within normal limits  URINALYSIS, ROUTINE W REFLEX MICROSCOPIC - Abnormal; Notable for the following:    APPearance CLOUDY (*)    Leukocytes, UA SMALL (*)    All other components within normal limits  URINE MICROSCOPIC-ADD ON - Abnormal; Notable for the following:    Squamous  Epithelial / LPF FEW (*)    Bacteria, UA FEW (*)    All other components within normal limits  URINE CULTURE  PREGNANCY, URINE   Dg Chest 2 View  07/20/2012  *RADIOLOGY REPORT*  Clinical Data: History of sickle cell crisis.  CHEST - 2 VIEW  Comparison: 05/10/2012.  08/08/2010.  Findings: Cardiac silhouette is normal size and shape.  Mediastinal and hilar contours appear stable.  There is slight nonspecific prominence of reticular interstitial markings without evidence of acute airspace disease, consolidation, or pleural effusion.  There is slight flattening of diaphragm on lateral image which may reflect minimal hyperinflation configuration.  No skeletal lesions are seen.  IMPRESSION: Slight nonspecific prominence of reticular interstitial markings. No acute airspace disease, consolidation, or pleural effusion.   Original Report Authenticated By: Onalee Hua Call    Dg Hip Complete Left  07/20/2012  *RADIOLOGY REPORT*  Clinical Data: Sickle cell crisis with pain all in the left hip and leg.  LEFT HIP - COMPLETE 2+ VIEW  Comparison: None.  Findings: Prominence of stool throughout the colon suggests constipation.  Degenerative arthropathy of both hips noted with spurring of the femoral heads and acetabula.  No specific radiographic findings of avascular necrosis.  IMPRESSION:  1. Prominence of stool throughout the colon suggests constipation. 2.  Degenerative arthropathy of both hips.   Original Report Authenticated By: Gaylyn Rong, M.D.    Dg Femur Left  07/20/2012  *RADIOLOGY REPORT*  Clinical Data: Sickle cell crisis.  Pain and left hip and leg.  LEFT FEMUR - 2 VIEW  Comparison: 07/20/2012  Findings: Faint sclerosis along the endosteum of the mid shaft of the femur could be a manifestation of chronic infarct.  Otherwise negative.  IMPRESSION:  1.  Linear sclerosis along the endosteum of the mid shaft could be a manifestation of chronic infarct, although such appearances are more common in the  metaphyses, and are usually more striking. Otherwise negative.   Original Report Authenticated By: Gaylyn Rong, M.D.      No diagnosis found.   Date: 07/20/2012  Rate: 82  Rhythm: normal sinus rhythm  QRS Axis: normal  Intervals: normal  ST/T Wave abnormalities: nonspecific ST changes  Conduction Disutrbances:none  Narrative Interpretation:   Old EKG Reviewed: unchanged    MDM  [name] Chiem is a 42 y.o. female here with L leg pain. Likely typical crisis. Will get L hip xray given  fever and leg pain. Will give pain meds and IVF and reassess.   9:33 AM Xray showed no acute osteomyelitis. Labs showed Hg stable, Ret increased. I think she is in crisis. Patient's pain 8/10 after fentanyl and dilaudid. I called sickle cell clinic regarding transfer for admission. They say that she is not an established patient so will recommend hospital admission. I will consult hospitalist.   9:53 AM I talked to Dr. Gonzella Lex, who accepted the patient on med/surg.       Richardean Canal, MD 07/20/12 [phone]

## 2012-07-20 NOTE — ED Notes (Signed)
Pt presents crying in pain from sickle cell crisis leg pain

## 2012-07-21 LAB — BASIC METABOLIC PANEL
Chloride: 109 mEq/L (ref 96–112)
GFR calc Af Amer: >90 mL/min (ref >90)
Potassium: 3.6 mEq/L (ref 3.5–5.1)

## 2012-07-21 LAB — CBC
Platelets: 365 10*3/uL (ref 150–400)
RDW: 15 % (ref 11.5–15.5)
WBC: 10.6 10*3/uL — ABNORMAL HIGH (ref 4.0–10.5)

## 2012-07-21 LAB — URINE CULTURE

## 2012-07-21 MED ORDER — MORPHINE SULFATE ER 60 MG PO TBCR: 60.0000 mg | EXTENDED_RELEASE_TABLET | Freq: Two times a day (BID) | ORAL | Status: DC

## 2012-07-21 NOTE — Discharge Summary (Signed)
Physician Discharge Summary  Lindsey [name] ZOX:[id number] DOB: November 20, 1970 DOA: 07/20/2012  PCP: Lieutenant Diego, MD  Admit date: 07/20/2012 Discharge date: 07/21/2012  Recommendations for Outpatient Follow-up:   Follow-up Information   Follow up with [facility], MD In 1 week.   Contact information:   [address] [place] [address]      Discharge Diagnoses:    Principal Problem:   Sickle cell crisis Active Problems:   Sickle cell anemia with pain   Fever, unspecified    Discharge Condition:Stable Disposition: Home Diet recommendation: Regular  Filed Weights   07/20/12 1053 07/21/12 0517  Weight: 67.132 kg (148 lb) 67.132 kg (148 lb)    History of present illness:  42 year old female with history of sickle cell disease presents with 2 day history off severe pain over left hip and leg which is described similar to her sickle cell pain crisis. Patient was admitted in January 2014 with similar sickle cell pain but signed out AMA. Patient had severe pain over her left hip and and thigh extending up to the knee 2 days back which was not relieved with her home pain medications. She denies having any fever, chills, chest pain, palpitations, other joint pains, blurry vision, dizziness, nausea, vomiting, abdominal pain, bowel or urinary symptoms.      Hospital Course:  Sickle cell crisis Patient was admitted with sickle cell pain crisis.  She was treated overnight with Dilaudid IV  2 mg every 3 hours as well as her MS IR 30 mg every 6 hours as needed as well as Toradol. The next day patient felt her pain was completely gone and she was pain-free. Patient was discharged to follow up with her primary care physician. On presentation she also was febrile. She had chest x-ray done that was normal urinalysis done that showed a few white blood cells blood, culture was negative. Patient was also afebrile at the time of discharge. She will follow up with her primary care doctor in a  week.    Fever unspecified  -No clear sign of infection. Urine microscopic does suggest possible UTI but patient was asymptomatic and culture was negative. Chest xray is negative. Blood culture was negative.   Discharge Instructions  Discharge Orders   Future Orders Complete By Expires     Diet general  As directed         Medication List    STOP taking these medications       morphine 100 MG 12 hr tablet  Commonly known as:  MS CONTIN      TAKE these medications       GOODY HEADACHE PO  Take 1 Package by mouth 2 (two) times daily as needed. PAIN     morphine 30 MG tablet  Commonly known as:  MSIR  Take 30 mg by mouth every 4 (four) hours as needed. For pain     morphine 60 MG 12 hr tablet  Commonly known as:  MS CONTIN  Take 1 tablet (60 mg total) by mouth 2 (two) times daily.           Follow-up Information   Follow up with [facility], MD In 1 week.   Contact information:   [address] [place] [address]        The results of significant diagnostics from this hospitalization (including imaging, microbiology, ancillary and laboratory) are listed below for reference.    Significant Diagnostic Studies: Dg Chest 2 View  07/20/2012  *RADIOLOGY REPORT*  Clinical  Data: History of sickle cell crisis.  CHEST - 2 VIEW  Comparison: 05/10/2012.  08/08/2010.  Findings: Cardiac silhouette is normal size and shape.  Mediastinal and hilar contours appear stable.  There is slight nonspecific prominence of reticular interstitial markings without evidence of acute airspace disease, consolidation, or pleural effusion.  There is slight flattening of diaphragm on lateral image which may reflect minimal hyperinflation configuration.  No skeletal lesions are seen.  IMPRESSION: Slight nonspecific prominence of reticular interstitial markings. No acute airspace disease, consolidation, or pleural effusion.   Original Report Authenticated By: Onalee Hua Call    Dg Hip  Complete Left  07/20/2012  *RADIOLOGY REPORT*  Clinical Data: Sickle cell crisis with pain all in the left hip and leg.  LEFT HIP - COMPLETE 2+ VIEW  Comparison: None.  Findings: Prominence of stool throughout the colon suggests constipation.  Degenerative arthropathy of both hips noted with spurring of the femoral heads and acetabula.  No specific radiographic findings of avascular necrosis.  IMPRESSION:  1. Prominence of stool throughout the colon suggests constipation. 2.  Degenerative arthropathy of both hips.   Original Report Authenticated By: Gaylyn Rong, M.D.    Dg Femur Left  07/20/2012  *RADIOLOGY REPORT*  Clinical Data: Sickle cell crisis.  Pain and left hip and leg.  LEFT FEMUR - 2 VIEW  Comparison: 07/20/2012  Findings: Faint sclerosis along the endosteum of the mid shaft of the femur could be a manifestation of chronic infarct.  Otherwise negative.  IMPRESSION:  1.  Linear sclerosis along the endosteum of the mid shaft could be a manifestation of chronic infarct, although such appearances are more common in the metaphyses, and are usually more striking. Otherwise negative.   Original Report Authenticated By: Gaylyn Rong, M.D.     Microbiology: Recent Results (from the past 240 hour(s))  URINE CULTURE     Status: None   Collection Time    07/20/12  8:34 AM      Result Value Range Status   Specimen Description URINE, CLEAN CATCH   Final   Special Requests NONE   Final   Culture  Setup Time 07/20/2012 14:02   Final   Colony Count 7,000 COLONIES/ML   Final   Culture INSIGNIFICANT GROWTH   Final   Report Status 07/21/2012 FINAL   Final     Labs: Basic Metabolic Panel:  Recent Labs Lab 07/18/12 2000 07/20/12 0648 07/21/12 0438  NA 139 139 140  K 4.0 3.7 3.6  CL 104 104 109  CO2 28 24 24   GLUCOSE 92 97 89  BUN 8 4* 6  CREATININE 0.75 0.70 0.64  CALCIUM 9.1 9.0 8.4   Liver Function Tests:  Recent Labs Lab 07/20/12 0648  AST 20  ALT 13  ALKPHOS 46   BILITOT 1.1  PROT 7.4  ALBUMIN 3.7   CBC:  Recent Labs Lab 07/18/12 2000 07/20/12 0648 07/21/12 0438  WBC 13.8* 15.0* 10.6*  NEUTROABS 6.5 9.3*  --   HGB 9.8* 10.5* 8.4*  HCT 27.7* 28.9* 22.3*  MCV 83.2 83.5 83.2  PLT 506* 547* 365    Time coordinating discharge:60 min  Signed:  Molli Posey, MD [facility] 07/21/2012, 10:31 AM

## 2012-07-21 NOTE — Progress Notes (Signed)
Pt D/C home, alert and oriented denies pain, D/C instructions done, medication administration instructions done, pt verbalizes understanding. done,

## 2012-07-22 NOTE — ED Provider Notes (Signed)
History    42-year-old female with atraumatic left thigh pain. Gradual onset yesterday. Persistent. Progressively worsening. Patient has a history of sickle cell anemia. States his current symptoms feel similar to prior painful crises. No fevers or chills. Denies any significant pain in rales. No nausea or vomiting. No shortness of breath. No unusual leg swelling. Denies history of blood clot. No unusual rash or joint swelling. Has been taking narcotics at home with minimal relief. CSN: [id number]  Arrival date & time 07/18/12  8295   First MD Initiated Contact with Patient 07/18/12 2050      Chief Complaint  Patient presents with  . Sickle Cell Pain Crisis    (Consider location/radiation/quality/duration/timing/severity/associated sxs/prior treatment) HPI  Past Medical History  Diagnosis Date  . Sickle cell crisis     Past Surgical History  Procedure Laterality Date  . Appendectomy    . Other surgical history      c-section  . Cesarean section      No family history on file.  History  Substance Use Topics  . Smoking status: Never Smoker   . Smokeless tobacco: Never Used  . Alcohol Use: Yes     Comment: occasionally    OB History   Grav Para Term Preterm Abortions TAB SAB Ect Mult Living                  Review of Systems  All systems reviewed and negative, other than as noted in HPI.   Allergies  Review of patient's allergies indicates no known allergies.  Home Medications   Current Outpatient Rx  Name  Route  Sig  Dispense  Refill  . Aspirin-Acetaminophen-Caffeine (GOODY HEADACHE PO)   Oral   Take 1 Package by mouth 2 (two) times daily as needed. PAIN         . morphine (MSIR) 30 MG tablet   Oral   Take 30 mg by mouth every 4 (four) hours as needed. For pain          . morphine (MS CONTIN) 60 MG 12 hr tablet   Oral   Take 1 tablet (60 mg total) by mouth 2 (two) times daily.      0     BP 101/48  Pulse 81  Temp(Src) 98.6 F (37 C)  (Oral)  Resp 16  SpO2 97%  Physical Exam  Nursing note and vitals reviewed. Constitutional: She appears well-developed and well-nourished. No distress.  Uncomfortable appearing  HENT:  Head: Normocephalic and atraumatic.  Eyes: Conjunctivae are normal. Right eye exhibits no discharge. Left eye exhibits no discharge.  Neck: Neck supple.  Cardiovascular: Normal rate, regular rhythm and normal heart sounds.  Exam reveals no gallop and no friction rub.   No murmur heard. Pulmonary/Chest: Effort normal and breath sounds normal. No respiratory distress.  Abdominal: Soft. She exhibits no distension. There is no tenderness.  Musculoskeletal: She exhibits no edema and no tenderness.  Lower extremities symmetric as compared to each other. No calf tenderness. Negative Homan's. No palpable cords. No significant increase in pain with ROM of L hip. NVI distally.    Neurological: She is alert.  Skin: Skin is warm and dry.  Psychiatric: She has a normal mood and affect. Her behavior is normal. Thought content normal.    ED Course  Procedures (including critical care time)  Labs Reviewed  CBC WITH DIFFERENTIAL - Abnormal; Notable for the following:    WBC 13.8 (*)    RBC 3.33 (*)  Hemoglobin 9.8 (*)    HCT 27.7 (*)    Platelets 506 (*)    Lymphs Abs 5.9 (*)    All other components within normal limits  RETICULOCYTES - Abnormal; Notable for the following:    Retic Ct Pct 5.1 (*)    RBC. 3.33 (*)    All other components within normal limits  BASIC METABOLIC PANEL   No results found.   1. Sickle cell anemia with pain       MDM  42 year old female with left thigh pain consistent with prior sickle cell vaso-occlusive crises. Afebrile. He controlled the patient states she feels comfortable going home. No chest pain, shortness of breath or cough to suggest possible acute chest syndrome. Very low suspicion for other potential complication. I feel patient is safe for discharge at this  time. Emergent return precautions were discussed.        Raeford Razor, MD 07/22/12 1550

## 2012-07-26 LAB — CULTURE, BLOOD (ROUTINE X 2): Culture: NO GROWTH

## 2012-10-05 NOTE — Telephone Encounter (Signed)
05/10/2012 08:52 PM Phone (Incoming) Leotis Shames [phone]  Olin Pia RN in ED who gave report, she will call back once CXR done so that [facility] can transport patient to [facility]   By Jama Flavors, RN

## 2012-11-17 ENCOUNTER — Encounter (HOSPITAL_COMMUNITY): Payer: Self-pay | Admitting: *Deleted

## 2012-11-17 ENCOUNTER — Emergency Department (HOSPITAL_COMMUNITY): Payer: Self-pay

## 2012-11-17 ENCOUNTER — Emergency Department (HOSPITAL_COMMUNITY)
Admission: EM | Admit: 2012-11-17 | Discharge: 2012-11-17 | Disposition: A | Discharge status: Home / Self Care | Payer: Self-pay | Attending: Emergency Medicine | Admitting: Emergency Medicine

## 2012-11-17 DIAGNOSIS — X58XXXA Exposure to other specified factors, initial encounter: Secondary | ICD-10-CM | POA: Insufficient documentation

## 2012-11-17 DIAGNOSIS — S62319A Displaced fracture of base of unspecified metacarpal bone, initial encounter for closed fracture: Secondary | ICD-10-CM | POA: Insufficient documentation

## 2012-11-17 DIAGNOSIS — Y9367 Activity, basketball: Secondary | ICD-10-CM | POA: Insufficient documentation

## 2012-11-17 DIAGNOSIS — Y92838 Other recreation area as the place of occurrence of the external cause: Secondary | ICD-10-CM | POA: Insufficient documentation

## 2012-11-17 DIAGNOSIS — Z79899 Other long term (current) drug therapy: Secondary | ICD-10-CM | POA: Insufficient documentation

## 2012-11-17 DIAGNOSIS — S62307A Unspecified fracture of fifth metacarpal bone, left hand, initial encounter for closed fracture: Secondary | ICD-10-CM

## 2012-11-17 DIAGNOSIS — Y9239 Other specified sports and athletic area as the place of occurrence of the external cause: Secondary | ICD-10-CM | POA: Insufficient documentation

## 2012-11-17 DIAGNOSIS — Z862 Personal history of diseases of the blood and blood-forming organs and certain disorders involving the immune mechanism: Secondary | ICD-10-CM | POA: Insufficient documentation

## 2012-11-17 MED ORDER — TRAMADOL HCL 50 MG PO TABS: 50.0000 mg | ORAL_TABLET | Freq: Once | ORAL | Status: DC

## 2012-11-17 MED ORDER — TRAMADOL HCL 50 MG PO TABS
50.0000 mg | ORAL_TABLET | Freq: Once | ORAL | Status: AC
  Administered 2012-11-17: 50 mg via ORAL
  Filled 2012-11-17: qty 1

## 2012-11-17 NOTE — ED Notes (Signed)
PT is here with left pinkie finger injury from playing basketball yesterday

## 2012-11-17 NOTE — ED Provider Notes (Signed)
History  This chart was scribed for non-physician practitioner Francee Piccolo, PA-C, working with Richardean Canal, MD, by Yevette Edwards, ED Scribe. This patient was seen in room TR07C/TR07C and the patient's care was started at 4:31 PM  CSN: [id number] Arrival date & time 11/17/12  1605  First MD Initiated Contact with Patient 11/17/12 1625     Chief Complaint  Patient presents with  . Finger Injury    The history is provided by the patient. No language interpreter was used.   HPI Comments: Lindsey [name] is a 42 y.o. female who presents to the Emergency Department complaining of an injury to her left pinkie finger which occurred two days ago while playing basketball. The pt describes the intermittent pain as throbbing, and she describes the pain as a 4/10. She states that keeping her hand still lessens the pain while movement aggravates her pain. She denies any radiation of her pain. The pt denies any prior breaks to her left pinkie. Denies fevers or chills.   Past Medical History  Diagnosis Date  . Sickle cell crisis    Past Surgical History  Procedure Laterality Date  . Appendectomy    . Other surgical history      c-section  . Cesarean section     No family history on file. History  Substance Use Topics  . Smoking status: Never Smoker   . Smokeless tobacco: Never Used  . Alcohol Use: Yes     Comment: occasionally   No OB history provided.   Review of Systems  Constitutional: Negative for fever and chills.  Musculoskeletal: Positive for arthralgias.  Skin: Negative for wound.    Allergies  Review of patient's allergies indicates no known allergies.  Home Medications   Current Outpatient Rx  Name  Route  Sig  Dispense  Refill  . Aspirin-Acetaminophen-Caffeine (GOODY HEADACHE PO)   Oral   Take 1 Package by mouth 2 (two) times daily as needed. PAIN         . morphine (MS CONTIN) 60 MG 12 hr tablet   Oral   Take 1 tablet (60 mg total) by mouth 2 (two)  times daily.      0   . morphine (MSIR) 30 MG tablet   Oral   Take 30 mg by mouth every 4 (four) hours as needed. For pain          . traMADol (ULTRAM) 50 MG tablet   Oral   Take 1 tablet (50 mg total) by mouth once.   20 tablet   0    Triage Vitals: BP 114/66  Pulse 85  Temp(Src) 98.7 F (37.1 C) (Oral)  Resp 16  SpO2 96%  LMP 11/17/2012  Physical Exam  Nursing note and vitals reviewed. Constitutional: She is oriented to person, place, and time. She appears well-developed and well-nourished. No distress.  HENT:  Head: Normocephalic and atraumatic.  Eyes: Conjunctivae are normal.  Neck: Neck supple.  Musculoskeletal:       Left wrist: Normal.       Hands: Left pinkie finger has decreased ROM due to pain. There is minimal swelling. There is no obvious deformity or laceration.  Cap refill is less than 3 seconds.  Radial and ulnar pulses intact.    Neurological: She is alert and oriented to person, place, and time.  Skin: Skin is warm and dry. She is not diaphoretic.  Psychiatric: She has a normal mood and affect.    ED Course  Procedures (  including critical care time)  DIAGNOSTIC STUDIES: Oxygen Saturation is 96% on room air, normal by my interpretation.    COORDINATION OF CARE:  4:27 PM- Informed pt of treatment plan which includes imaging. The pt agreed.    Labs Reviewed - No data to display Dg Hand Complete Left  11/17/2012   *RADIOLOGY REPORT*  Clinical Data: Injury  LEFT HAND - COMPLETE 3+ VIEW  Comparison: None  Findings: Intra-articular fracture of the fifth metacarpal phalangeal fracture.  There is a fracture through the base of the fifth proximal phalanx.  No other fracture.  IMPRESSION: Intra-articular fracture base of the fifth proximal phalanx.   Original Report Authenticated By: Janeece Riggers, M.D.   1. Fracture of fifth metacarpal bone of left hand, closed, initial encounter     MDM  Pt w/ closed fifth metacarpal fracture that is  neurovascularly intact. Finger splinted. Ortho f/u recommended. Pain medication indicated. Imaging reviewed. VSS. Return precautions discussed with patient. Patient is agreeable to plan. Patient is stable at time of discharge    I personally performed the services described in this documentation, which was scribed in my presence. The recorded information has been reviewed and is accurate.     Jeannetta Ellis, PA-C 11/17/12 1921

## 2012-11-18 NOTE — ED Provider Notes (Signed)
Medical screening examination/treatment/procedure(s) were performed by non-physician practitioner and as supervising physician I was immediately available for consultation/collaboration.   Oriah Leinweber H Chinenye Katzenberger, MD 11/18/12 0004 

## 2012-12-25 ENCOUNTER — Emergency Department (HOSPITAL_COMMUNITY)
Admission: EM | Admit: 2012-12-25 | Discharge: 2012-12-25 | Disposition: A | Discharge status: Home / Self Care | Payer: Self-pay | Attending: Emergency Medicine | Admitting: Emergency Medicine

## 2012-12-25 ENCOUNTER — Encounter (HOSPITAL_COMMUNITY): Payer: Self-pay | Admitting: Emergency Medicine

## 2012-12-25 DIAGNOSIS — Z79899 Other long term (current) drug therapy: Secondary | ICD-10-CM | POA: Insufficient documentation

## 2012-12-25 DIAGNOSIS — D5700 Hb-SS disease with crisis, unspecified: Secondary | ICD-10-CM | POA: Insufficient documentation

## 2012-12-25 DIAGNOSIS — Z3202 Encounter for pregnancy test, result negative: Secondary | ICD-10-CM | POA: Insufficient documentation

## 2012-12-25 DIAGNOSIS — Z87828 Personal history of other (healed) physical injury and trauma: Secondary | ICD-10-CM | POA: Insufficient documentation

## 2012-12-25 LAB — COMPREHENSIVE METABOLIC PANEL
ALT: 12 U/L (ref 0–35)
AST: 17 U/L (ref 0–37)
Calcium: 9.1 mg/dL (ref 8.4–10.5)
Sodium: 140 mEq/L (ref 135–145)
Total Protein: 7.1 g/dL (ref 6.0–8.3)

## 2012-12-25 LAB — CBC WITH DIFFERENTIAL/PLATELET
Basophils Absolute: 0 10*3/uL (ref 0.0–0.1)
Basophils Relative: 0 % (ref 0–1)
HCT: 27.9 % — ABNORMAL LOW (ref 36.0–46.0)
Hemoglobin: 10.3 g/dL — ABNORMAL LOW (ref 12.0–15.0)
Lymphocytes Relative: 41 % (ref 12–46)
Monocytes Relative: 6 % (ref 3–12)
Neutro Abs: 6.9 10*3/uL (ref 1.7–7.7)
Neutrophils Relative %: 47 % (ref 43–77)
RDW: 14.8 % (ref 11.5–15.5)
WBC: 14.8 10*3/uL — ABNORMAL HIGH (ref 4.0–10.5)

## 2012-12-25 LAB — URINE MICROSCOPIC-ADD ON

## 2012-12-25 LAB — URINALYSIS, ROUTINE W REFLEX MICROSCOPIC
Bilirubin Urine: NEGATIVE
Hgb urine dipstick: NEGATIVE
Specific Gravity, Urine: 1.012 (ref 1.005–1.030)
pH: 6.5 (ref 5.0–8.0)

## 2012-12-25 MED ORDER — KETOROLAC TROMETHAMINE 30 MG/ML IJ SOLN
30.0000 mg | Freq: Once | INTRAMUSCULAR | Status: AC
  Administered 2012-12-25: 30 mg via INTRAVENOUS
  Filled 2012-12-25: qty 1

## 2012-12-25 MED ORDER — SODIUM CHLORIDE 0.9 % IV BOLUS (SEPSIS)
2000.0000 mL | Freq: Once | INTRAVENOUS | Status: AC
  Administered 2012-12-25: 2000 mL via INTRAVENOUS

## 2012-12-25 MED ORDER — MORPHINE SULFATE ER 60 MG PO TBCR: 60.0000 mg | EXTENDED_RELEASE_TABLET | Freq: Two times a day (BID) | ORAL | Status: DC

## 2012-12-25 MED ORDER — MORPHINE SULFATE 30 MG PO TABS: 30.0000 mg | ORAL_TABLET | ORAL | Status: DC | PRN

## 2012-12-25 MED ORDER — MORPHINE SULFATE 4 MG/ML IJ SOLN
4.0000 mg | INTRAMUSCULAR | Status: AC | PRN
  Administered 2012-12-25 (×3): 4 mg via INTRAVENOUS
  Filled 2012-12-25 (×3): qty 1

## 2012-12-25 NOTE — ED Provider Notes (Signed)
TIME SEEN: 3:20 PM  CHIEF COMPLAINT: Sickle cell pain crisis  HPI: Patient is a 42 year old female with history of sickle cell disease who presents the emergency department with right lower extremity pain is similar to her prior sickle cell pain crises. She denies any prior history of DVT. No lower extremity swelling. A history of trauma. No fever, cough, shortness of breath, chest pain. She states her pain started this afternoon and is unrelieved with home medications. Her pain is typical for sickle cell crises. Patient reports that Toradol has helped in the past significantly  As well as morphine.  ROS: See HPI Constitutional: no fever  Eyes: no drainage  ENT: no runny nose   Cardiovascular:  no chest pain  Resp: no SOB  GI: no vomiting GU: no dysuria Integumentary: no rash  Allergy: no hives  Musculoskeletal: no leg swelling  Neurological: no slurred speech ROS otherwise negative  PAST MEDICAL HISTORY/PAST SURGICAL HISTORY:  Past Medical History  Diagnosis Date  . Sickle cell crisis     MEDICATIONS:  Prior to Admission medications   Medication Sig Start Date End Date Taking? Authorizing Provider  Aspirin-Acetaminophen-Caffeine (GOODY HEADACHE PO) Take 1 Package by mouth 2 (two) times daily as needed. PAIN    Historical Provider, MD  morphine (MS CONTIN) 60 MG 12 hr tablet Take 1 tablet (60 mg total) by mouth 2 (two) times daily. 07/21/12   Novlet Adelina Mings, MD  morphine (MSIR) 30 MG tablet Take 30 mg by mouth every 4 (four) hours as needed. For pain     Historical Provider, MD  traMADol (ULTRAM) 50 MG tablet Take 1 tablet (50 mg total) by mouth once. 11/17/12   Jennifer L Piepenbrink, PA-C    ALLERGIES:  No Known Allergies  SOCIAL HISTORY:  History  Substance Use Topics  . Smoking status: Never Smoker   . Smokeless tobacco: Never Used  . Alcohol Use: Yes     Comment: occasionally    FAMILY HISTORY: No family history on file.  EXAM: BP 111/90  Pulse 100  Temp(Src)  99.2 F (37.3 C) (Oral)  Resp 18  SpO2 95% CONSTITUTIONAL: Alert and oriented and responds appropriately to questions. Well-appearing; well-nourished, Appears uncomfortable, tearful  HEAD: Normocephalic EYES: Conjunctivae clear, PERRL ENT: normal nose; no rhinorrhea; moist mucous membranes; pharynx without lesions noted NECK: Supple, no meningismus, no LAD  CARD: RRR; S1 and S2 appreciated; no murmurs, no clicks, no rubs, no gallops RESP: Normal chest excursion without splinting or tachypnea; breath sounds clear and equal bilaterally; no wheezes, no rhonchi, no rales,  ABD/GI: Normal bowel sounds; non-distended; soft, non-tender, no rebound, no guarding BACK:  The back appears normal and is non-tender to palpation, there is no CVA tenderness EXT: Normal ROM in all joints; non-tender to palpation; no edema; normal capillary refill; no cyanosis    SKIN: Normal color for age and race; warm NEURO: Moves all extremities equally PSYCH: The patient's mood and manner are appropriate. Grooming and personal hygiene are appropriate.  MEDICAL DECISION MAKING:  Patient with sickle cell crisis with pain in her right lower extremity. No signs or symptoms to suggest DVT. Full range of motion in all joints. No history of avascular necrosis. Will check labs, urine. We'll give pain medication, oxygen and IV fluids. She states that she is unsure what her baseline hemoglobin is. She's never had a transfusion before.   ED PROGRESS: Pt's hemoglobin is at baseline. She does have a leukocytosis which is likely due to her sickle  cell pain crisis. No signs or symptoms of infection. Urinalysis pending. Patient reports feeling better after 2 rounds of pain medication. We'll give third round and assess for disposition.  6:55 PM  Patient reports feeling much better after her third round of pain medication. She reports she does need a refill of her chronic medications as she is out. She does have followup with her  hematologist this week. She feels she is ready to go home and can manage her pain at home. Given customary usual return precautions. Patient and significant other at bedside verbalize understanding and are comfortable with this plan.  [name], DO 12/25/12 [phone]

## 2012-12-25 NOTE — ED Notes (Signed)
Pt states she is having a sickle cell crisis.  C/o pain in legs since 11 am.  Pt hysterical in pain.

## 2012-12-26 ENCOUNTER — Inpatient Hospital Stay (HOSPITAL_COMMUNITY): Payer: Self-pay

## 2012-12-26 ENCOUNTER — Inpatient Hospital Stay (HOSPITAL_COMMUNITY)
Admission: EM | Admit: 2012-12-26 | Discharge: 2012-12-26 | DRG: 812 | Disposition: A | Discharge status: Home / Self Care | Payer: Self-pay | Attending: Family Medicine | Admitting: Family Medicine

## 2012-12-26 ENCOUNTER — Encounter (HOSPITAL_COMMUNITY): Payer: Self-pay | Admitting: Neurology

## 2012-12-26 DIAGNOSIS — D72829 Elevated white blood cell count, unspecified: Secondary | ICD-10-CM | POA: Diagnosis present

## 2012-12-26 DIAGNOSIS — D5700 Hb-SS disease with crisis, unspecified: Principal | ICD-10-CM

## 2012-12-26 DIAGNOSIS — R112 Nausea with vomiting, unspecified: Secondary | ICD-10-CM | POA: Diagnosis present

## 2012-12-26 DIAGNOSIS — F17200 Nicotine dependence, unspecified, uncomplicated: Secondary | ICD-10-CM | POA: Diagnosis present

## 2012-12-26 DIAGNOSIS — R509 Fever, unspecified: Secondary | ICD-10-CM | POA: Diagnosis present

## 2012-12-26 LAB — URINE CULTURE: Colony Count: NO GROWTH

## 2012-12-26 LAB — COMPREHENSIVE METABOLIC PANEL
ALT: 39 U/L — ABNORMAL HIGH (ref 0–35)
Alkaline Phosphatase: 36 U/L — ABNORMAL LOW (ref 39–117)
CO2: 26 mEq/L (ref 19–32)
Chloride: 109 mEq/L (ref 96–112)
GFR calc Af Amer: >90 mL/min (ref >90)
GFR calc non Af Amer: >90 mL/min (ref >90)
Glucose, Bld: 103 mg/dL — ABNORMAL HIGH (ref 70–99)
Potassium: 3.6 mEq/L (ref 3.5–5.1)
Sodium: 140 mEq/L (ref 135–145)
Total Bilirubin: 0.8 mg/dL (ref 0.3–1.2)

## 2012-12-26 LAB — CBC
HCT: 24.4 % — ABNORMAL LOW (ref 36.0–46.0)
Hemoglobin: 8.9 g/dL — ABNORMAL LOW (ref 12.0–15.0)
Hemoglobin: 8.3 g/dL — ABNORMAL LOW (ref 12.0–15.0)
MCH: 31.1 pg (ref 26.0–34.0)
MCHC: 36.5 g/dL — ABNORMAL HIGH (ref 30.0–36.0)
RBC: 2.59 MIL/uL — ABNORMAL LOW (ref 3.87–5.11)

## 2012-12-26 LAB — RETICULOCYTES: RBC.: 2.86 MIL/uL — ABNORMAL LOW (ref 3.87–5.11)

## 2012-12-26 MED ORDER — SODIUM CHLORIDE 0.9 % IV SOLN: INTRAVENOUS | Status: DC

## 2012-12-26 MED ORDER — ONDANSETRON HCL 4 MG/2ML IJ SOLN
4.0000 mg | Freq: Once | INTRAMUSCULAR | Status: AC
  Administered 2012-12-26: 4 mg via INTRAVENOUS
  Filled 2012-12-26: qty 2

## 2012-12-26 MED ORDER — HYDROMORPHONE HCL PF 2 MG/ML IJ SOLN
2.0000 mg | INTRAMUSCULAR | Status: DC | PRN
  Administered 2012-12-26: 2 mg via INTRAVENOUS
  Filled 2012-12-26: qty 1

## 2012-12-26 MED ORDER — DEXTROSE-NACL 5-0.45 % IV SOLN
INTRAVENOUS | Status: DC
  Administered 2012-12-26 (×2): via INTRAVENOUS

## 2012-12-26 MED ORDER — HYDROMORPHONE HCL PF 2 MG/ML IJ SOLN
INTRAMUSCULAR | Status: AC
  Administered 2012-12-26: 2 mg via INTRAVENOUS
  Filled 2012-12-26: qty 1

## 2012-12-26 MED ORDER — ONDANSETRON HCL 4 MG/2ML IJ SOLN: 4.0000 mg | Freq: Four times a day (QID) | INTRAMUSCULAR | Status: DC | PRN

## 2012-12-26 MED ORDER — MORPHINE SULFATE ER 30 MG PO TBCR
60.0000 mg | EXTENDED_RELEASE_TABLET | Freq: Two times a day (BID) | ORAL | Status: DC
  Administered 2012-12-26: 60 mg via ORAL
  Filled 2012-12-26: qty 2

## 2012-12-26 MED ORDER — HEPARIN SODIUM (PORCINE) 5000 UNIT/ML IJ SOLN
5000.0000 [IU] | Freq: Three times a day (TID) | INTRAMUSCULAR | Status: DC
  Administered 2012-12-26: 5000 [IU] via SUBCUTANEOUS
  Filled 2012-12-26 (×3): qty 1

## 2012-12-26 MED ORDER — MORPHINE SULFATE 15 MG PO TABS
30.0000 mg | ORAL_TABLET | ORAL | Status: DC | PRN
  Administered 2012-12-26 (×2): 30 mg via ORAL
  Filled 2012-12-26 (×2): qty 2

## 2012-12-26 MED ORDER — HYDROMORPHONE HCL PF 2 MG/ML IJ SOLN
2.0000 mg | Freq: Once | INTRAMUSCULAR | Status: AC
  Administered 2012-12-26: 2 mg via INTRAVENOUS
  Filled 2012-12-26: qty 1

## 2012-12-26 MED ORDER — KETOROLAC TROMETHAMINE 30 MG/ML IJ SOLN
30.0000 mg | Freq: Three times a day (TID) | INTRAMUSCULAR | Status: DC
  Administered 2012-12-26 (×2): 30 mg via INTRAVENOUS
  Filled 2012-12-26 (×2): qty 1

## 2012-12-26 MED ORDER — DIPHENHYDRAMINE HCL 25 MG PO CAPS: 25.0000 mg | ORAL_CAPSULE | Freq: Three times a day (TID) | ORAL | Status: DC | PRN

## 2012-12-26 NOTE — Discharge Summary (Signed)
Physician Discharge Summary  Lindsey [name] ZOX:[id number] DOB: 07-14-1970 DOA: 12/26/2012  PCP: Lieutenant Diego, MD  Admit date: 12/26/2012 Discharge date: 12/26/2012  Time spent: 35 minutes  Recommendations for Outpatient Follow-up:  1. Patient to follow with Dr. Thea Silversmith for routine care and pain med refills  2. 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  No fever, chills, n,v,cough,cold.  Initially Hb was 14.  She was noted to be mildly anemic and pain was in LE's and was 9/10.  She was given IV pain meds [dilaudid/toradol] which greatly reduced her pain] and she felt subjectively better and wanted to be d/c 12/26/12 She understood that as she has pain medicines at home, we would not be rx anything else for her at this time and that she should return to Dr. Thea Silversmith in week for coordination of her care  Discharge Exam: Filed Vitals:   12/26/12 1351  BP: 94/66  Pulse: 59  Temp: 97.7 F (36.5 C)  Resp:    Alert pleasant oriented in NAD  General:  Eomi, no ict/pallor Cardiovascular: s1 s2 no m/r/g Respiratory: clear  Discharge Instructions     Medication List    ASK your doctor about these medications       GOODY HEADACHE PO  Take 1 Package by mouth 2 (two) times daily as needed. PAIN     morphine 30 MG tablet  Commonly known as:  MSIR  Take 30 mg by mouth every 4 (four) hours as needed. For pain     morphine 60 MG 12 hr tablet  Commonly known as:  MS CONTIN  Take 1 tablet (60 mg total) by mouth 2 (two) times daily.     traMADol 50 MG tablet  Commonly known as:  ULTRAM  Take 1 tablet (50 mg total) by mouth once.       No Known  Allergies    The results of significant diagnostics from this hospitalization (including imaging, microbiology, ancillary and laboratory) are listed below for reference.    Significant Diagnostic Studies: Dg Chest Port 1 View  12/26/2012   *RADIOLOGY REPORT*  Clinical Data: Sickle cell crisis.  Fever.  PORTABLE CHEST - 1 VIEW  Comparison: 07/20/2012  Findings: Shallow inspiration.  Heart size and pulmonary vascularity are normal for technique.  Linear atelectasis in the left lung base.  No evidence of consolidation or airspace disease. No blunting of costophrenic angles.  No pneumothorax.  Mediastinal contours appear intact.  IMPRESSION: Atelectasis in the left lung base.  No evidence of focal consolidation or infiltration.   Original Report Authenticated By: Burman Nieves, M.D.    Microbiology: No results found for this or any previous visit (from the past 240 hour(s)).   Labs: Basic Metabolic Panel:  Recent Labs Lab 12/25/12 1554 12/26/12 1401  NA 140 140  K 3.9 3.6  CL 105 109  CO2 27 26  GLUCOSE 98 103*  BUN 8 6  CREATININE 0.75 0.77  CALCIUM 9.1 8.0*   Liver Function Tests:  Recent Labs Lab 12/25/12 1554 12/26/12 1401  AST 17 41*  ALT 12 39*  ALKPHOS 38* 36*  BILITOT 0.8 0.8  PROT 7.1 5.7*  ALBUMIN 3.8 3.0*   No results found for this basename: LIPASE, AMYLASE,  in the last 168 hours No results found for this basename: AMMONIA,  in the last 168 hours CBC:  Recent Labs Lab 12/25/12 1554 12/26/12 0435  WBC 14.8* 10.6*  NEUTROABS 6.9  --   HGB 10.3* 8.9*  HCT 27.9* 24.4*  MCV 85.6 85.3  PLT 525* 407*   Cardiac Enzymes: No results found for this basename: CKTOTAL, CKMB, CKMBINDEX, TROPONINI,  in the last 168 hours BNP: BNP (last 3 results) No results found for this basename: PROBNP,  in the last 8760 hours CBG: No results found for this basename: GLUCAP,  in the last 168 hours     Signed:  Rhetta Mura  [facility] 12/26/2012,  4:58 PM

## 2012-12-26 NOTE — Progress Notes (Addendum)
2:01 PM I agree with HPI/GPe and A/P per Dr. Julian Reil  Patient doing well, pain currently controlled in lower extremities 3/10. No nausea vomiting no fever no chills no blurred vision no double vision      Patient Active Problem List   Diagnosis Date Noted  . Sickle cell crisis 07/20/2012  . Fever, unspecified 07/20/2012  . Sickle cell anemia with pain 05/10/2012   Agree with current plan and management, continue hydration, and IV Dilaudid and Toradol. Monitor and potential discharge 1-2 days if still stable and better  Pleas Koch, MD [facility] [phone]    .

## 2012-12-26 NOTE — ED Provider Notes (Signed)
CSN: [id number]     Arrival date & time 12/26/12  4098 History     First MD Initiated Contact with Patient 12/26/12 0341     No chief complaint on file.  (Consider location/radiation/quality/duration/timing/severity/associated sxs/prior Treatment) HPI Comments: Ms. returns to the ER for evaluation of sickle cell crisis. Patient was seen earlier this evening with complaints of severe pain in the right hip and upper leg region. Patient was hydrated and medicated, reporting improvement. She was discharged to home, but upon arriving at home had increased pain. She tried to take her oral pain medication but vomited and could not hold it down. Patient returns with severe pain in the same location.   Past Medical History  Diagnosis Date  . Sickle cell crisis    Past Surgical History  Procedure Laterality Date  . Appendectomy    . Other surgical history      c-section  . Cesarean section     No family history on file. History  Substance Use Topics  . Smoking status: Never Smoker   . Smokeless tobacco: Never Used  . Alcohol Use: Yes     Comment: occasionally   OB History   Grav Para Term Preterm Abortions TAB SAB Ect Mult Living                 Review of Systems  Respiratory: Negative for shortness of breath.   Cardiovascular: Negative for chest pain.  Musculoskeletal: Positive for arthralgias. Negative for back pain.  All other systems reviewed and are negative.    Allergies  Review of patient's allergies indicates no known allergies.  Home Medications   Current Outpatient Rx  Name  Route  Sig  Dispense  Refill  . Aspirin-Acetaminophen-Caffeine (GOODY HEADACHE PO)   Oral   Take 1 Package by mouth 2 (two) times daily as needed. PAIN         . morphine (MS CONTIN) 60 MG 12 hr tablet   Oral   Take 1 tablet (60 mg total) by mouth 2 (two) times daily.      0   . morphine (MS CONTIN) 60 MG 12 hr tablet   Oral   Take 1 tablet (60 mg total) by mouth 2 (two) times  daily.   6 tablet   0   . morphine (MSIR) 30 MG tablet   Oral   Take 30 mg by mouth every 4 (four) hours as needed. For pain          . morphine (MSIR) 30 MG tablet   Oral   Take 1 tablet (30 mg total) by mouth every 4 (four) hours as needed for pain.   10 tablet   0   . traMADol (ULTRAM) 50 MG tablet   Oral   Take 1 tablet (50 mg total) by mouth once.   20 tablet   0    SpO2 93% Physical Exam  Constitutional: She is oriented to person, place, and time. She appears well-developed and well-nourished. She appears distressed (crying).  HENT:  Head: Normocephalic and atraumatic.  Right Ear: Hearing normal.  Left Ear: Hearing normal.  Nose: Nose normal.  Mouth/Throat: Oropharynx is clear and moist and mucous membranes are normal.  Eyes: Conjunctivae and EOM are normal. Pupils are equal, round, and reactive to light.  Neck: Normal range of motion. Neck supple.  Cardiovascular: Regular rhythm, S1 normal and S2 normal.  Exam reveals no gallop and no friction rub.   No murmur heard. Pulmonary/Chest: Effort normal  and breath sounds normal. No respiratory distress. She exhibits no tenderness.  Abdominal: Soft. Normal appearance and bowel sounds are normal. There is no hepatosplenomegaly. There is no tenderness. There is no rebound, no guarding, no tenderness at McBurney's point and negative Murphy's sign. No hernia.  Musculoskeletal: Normal range of motion.  Neurological: She is alert and oriented to person, place, and time. She has normal strength. No cranial nerve deficit or sensory deficit. Coordination normal. GCS eye subscore is 4. GCS verbal subscore is 5. GCS motor subscore is 6.  Skin: Skin is warm, dry and intact. No rash noted. No cyanosis.  Psychiatric: She has a normal mood and affect. Her speech is normal and behavior is normal. Thought content normal.    ED Course   Procedures (including critical care time)  Labs Reviewed  CBC - Abnormal; Notable for the following:     WBC 10.6 (*)    RBC 2.86 (*)    Hemoglobin 8.9 (*)    HCT 24.4 (*)    MCHC 36.5 (*)    Platelets 407 (*)    All other components within normal limits  RETICULOCYTES - Abnormal; Notable for the following:    Retic Ct Pct 6.0 (*)    RBC. 2.86 (*)    All other components within normal limits   No results found.  Diagnosis: Sickle cell crisis  MDM  Presents to the ER for evaluation of pain in the right hip and upper leg region which she attributes to her sickle cell disease. Patient was seen in the ER earlier today and treated. She reports that her pain improved, was approximately 3/10 when she left the ER. Since going home, however, she has had increased pain and was unable to hold down her pain medication. She comes in moderate distress, tearful secondary to pain. No overlying skin changes to suggest infection. No distal pain, swelling or tenderness to suggest DVT.  Patient has been given multiple doses of Dilantin here in the ER without resolution of the pain. Repeat CBC shows drop in her hemoglobin from 10.3 to 8.9. Patient on oxygen and administered IV fluids in addition to analgesia. Reticulocyte count is appropriately elevated. Does not have any shortness of breath or chest pain to cause any concern for acute chest syndrome. Patient will require hospitalization due to her persistent pain.  Gilda Crease, MD 12/26/12 [phone]

## 2012-12-26 NOTE — Progress Notes (Signed)
42 yo AAF with typical sickle cell crisis symptoms, agree with above.

## 2012-12-26 NOTE — Progress Notes (Signed)
Referring MD: EDP PCP: Lindsey [name]  HPIClydie [name] is a 42 yo AAF with sickle cell disease and otherwise benign PMHx, who returns to the ED for pain uncontrolled at home with normal sickle cell pain regimen. She was seen yesterday here at ED and pain was managed with hydration and narcotics. By about 2030 last night, her pain was down to a 3/10 and she felt able to leave and was discharged home from the ED. Around 0300 today, she returned with worsened pain. She attempted to take her home meds and she vomited post dose. She reports no further n/v or abd pain. Her pain worsened to a 8/10 in the RLE and is described as dull, constant and is consistent with her normal sickle cell pain. She denies any back trouble or back pain. She has a low grade fever but denies any sick contacts or cold/flu sx, diarrhea, abd pain, or burning with urination. Her last hospital stay was in March of 2014 for same. She normally takes MS Contin 60mg  po q12 and MSIR 30mg  po q4hr prn breakthrough pain.  In the ED (between earlier visit and now), her labs reveal anemia with Hgb 10.3 down to 8.9, leukocytosis with WBCC 14.8 down to 10.6, Neg UA with pending cx, retic count of 171.6, normal total bili, neg pregnancy test, and negative CXR.  Therefore, because of her uncontrolled pain, [facility] is asked to admit pt for further evaluation and treatment.   Subjective: as per HPI and positive for HA. Neg for vision changes, eye discharge, ear pain or drainage, sorethroat, cough, lymphadenopathy, chest pain, DOE, wheezing, SOB, abd pain, diarrhea, dysuria, joint pain other than right leg, joint swelling, numbness, tingling, weakness, depression or anxiety.  Objective: Vital signs in last 24 hours: Temp:  [98.1 F (36.7 C)-99.5 F (37.5 C)] 99.5 F (37.5 C) (08/20 0344) Pulse Rate:  [56-100] 74 (08/20 0345) Resp:  [14-18] 14 (08/20 0344) BP: (109-133)/(61-90) 133/71 mmHg (08/20 0345) SpO2:  [95 %-100 %] 100 % (08/20  0345) Weight change:     PE: General: 42 yo AAF who appears fairly well, not toxic. Is grimacing in pain. Husband at bedside.  HEENT: [place], AT. External features normal. Oropharynx moist without erythema. No anterior cervical lymphadenopathy.  CARD: S1S2 without MRG. RRR. PP 2+ RESP: normal effort, good air exchange, lungs CTA. ABD: BS hypoactive. NT, ND. MSK: MOE x 4. No joint swelling, tenderness or erythema noted. LEs warm.  NEURO: grossly intact. PSYCH: alert and oriented. Affect appropriate.  SKIN: warm and dry.  Lab Results:  Recent Labs  12/25/12 1554 12/26/12 0435  WBC 14.8* 10.6*  HGB 10.3* 8.9*  HCT 27.9* 24.4*  PLT 525* 407*   BMET  Recent Labs  12/25/12 1554  NA 140  K 3.9  CL 105  CO2 27  GLUCOSE 98  BUN 8  CREATININE 0.75  CALCIUM 9.1    Studies/Results: Dg Chest Port 1 View  12/26/2012   *RADIOLOGY REPORT*  Clinical Data: Sickle cell crisis.  Fever.  PORTABLE CHEST - 1 VIEW  Comparison: 07/20/2012  Findings: Shallow inspiration.  Heart size and pulmonary vascularity are normal for technique.  Linear atelectasis in the left lung base.  No evidence of consolidation or airspace disease. No blunting of costophrenic angles.  No pneumothorax.  Mediastinal contours appear intact.  IMPRESSION: Atelectasis in the left lung base.  No evidence of focal consolidation or infiltration.   Original Report Authenticated By: Burman Nieves, M.D.    Medications: reviewed  Assessment/Plan: 1. Sickle cell disease with anemia and pain unrelieved at home-admit to med-surg. IVF hydration. Continue home meds and add Dilaudid prn and Toradol ATC. She has dropped Hgb since earlier visit, recheck CBC today along with CMP.  2. Low grade fever-likely inflammatory. Watch carefully. Mild leukocytosis. Neg UA and CXR. No signs of infection on exam. 3. Nausea/vomiting-once, hasn't recurred. No abd pain. Advance diet to regular. Zofran prn. 4. Tobacco use-she began smoking 2 "little"  cigars a day back in May. She is ready to quit. Counsel given and will provide written materials.  5. VTE PPX-heparin. Early ambulation.  6. FULL CODE.  Discussed plan with pt and husband who are receptive.  Discussed pt and treatment plan with Dr. Julian [name] who agrees and will cosign/edit note as needed.   Time spent on admission-35 min.    LOS: 0 days   Lindsey [name] 12/26/2012, 6:00 AM

## 2012-12-26 NOTE — ED Notes (Signed)
MD at bedside. 

## 2012-12-27 NOTE — H&P (Signed)
Please see H&P that is mislabeled as "progress note" for 12/26/12 Jimmye Norman, NP [facility]

## 2013-03-14 ENCOUNTER — Other Ambulatory Visit: Payer: Self-pay

## 2014-03-25 ENCOUNTER — Encounter (HOSPITAL_COMMUNITY): Payer: Self-pay | Admitting: *Deleted

## 2014-03-25 ENCOUNTER — Emergency Department (HOSPITAL_COMMUNITY)
Admission: EM | Admit: 2014-03-25 | Discharge: 2014-03-25 | Disposition: A | Discharge status: Home / Self Care | Payer: Self-pay | Attending: Emergency Medicine | Admitting: Emergency Medicine

## 2014-03-25 DIAGNOSIS — I959 Hypotension, unspecified: Secondary | ICD-10-CM | POA: Insufficient documentation

## 2014-03-25 DIAGNOSIS — Z79899 Other long term (current) drug therapy: Secondary | ICD-10-CM | POA: Insufficient documentation

## 2014-03-25 DIAGNOSIS — D5700 Hb-SS disease with crisis, unspecified: Secondary | ICD-10-CM | POA: Insufficient documentation

## 2014-03-25 DIAGNOSIS — Z3202 Encounter for pregnancy test, result negative: Secondary | ICD-10-CM | POA: Insufficient documentation

## 2014-03-25 DIAGNOSIS — Z720 Tobacco use: Secondary | ICD-10-CM | POA: Insufficient documentation

## 2014-03-25 LAB — CBC WITH DIFFERENTIAL/PLATELET
Basophils Absolute: 0 10*3/uL (ref 0.0–0.1)
Basophils Relative: 0 % (ref 0–1)
EOS ABS: 0.3 10*3/uL (ref 0.0–0.7)
EOS PCT: 2 % (ref 0–5)
HCT: 28.6 % — ABNORMAL LOW (ref 36.0–46.0)
HEMOGLOBIN: 10.6 g/dL — AB (ref 12.0–15.0)
LYMPHS ABS: 3.2 10*3/uL (ref 0.7–4.0)
LYMPHS PCT: 21 % (ref 12–46)
MCH: 31.1 pg (ref 26.0–34.0)
MCHC: 37.1 g/dL — ABNORMAL HIGH (ref 30.0–36.0)
MCV: 83.4 fL (ref 78.0–100.0)
MONOS PCT: 5 % (ref 3–12)
Monocytes Absolute: 0.8 10*3/uL (ref 0.1–1.0)
Neutro Abs: 11 10*3/uL — ABNORMAL HIGH (ref 1.7–7.7)
Neutrophils Relative %: 72 % (ref 43–77)
PLATELETS: 457 10*3/uL — AB (ref 150–400)
RBC: 3.43 MIL/uL — AB (ref 3.87–5.11)
RDW: 15.2 % (ref 11.5–15.5)
WBC: 15.3 10*3/uL — AB (ref 4.0–10.5)

## 2014-03-25 LAB — COMPREHENSIVE METABOLIC PANEL
ALK PHOS: 41 U/L (ref 39–117)
ALT: 13 U/L (ref 0–35)
ANION GAP: 13 (ref 5–15)
AST: 18 U/L (ref 0–37)
Albumin: 4.2 g/dL (ref 3.5–5.2)
BUN: 6 mg/dL (ref 6–23)
CALCIUM: 9.2 mg/dL (ref 8.4–10.5)
CO2: 23 meq/L (ref 19–32)
Chloride: 104 mEq/L (ref 96–112)
Creatinine, Ser: 0.73 mg/dL (ref 0.50–1.10)
GLUCOSE: 95 mg/dL (ref 70–99)
POTASSIUM: 4.3 meq/L (ref 3.7–5.3)
SODIUM: 140 meq/L (ref 137–147)
TOTAL PROTEIN: 7.6 g/dL (ref 6.0–8.3)
Total Bilirubin: 1.1 mg/dL (ref 0.3–1.2)

## 2014-03-25 LAB — RETICULOCYTES
RBC.: 3.43 MIL/uL — ABNORMAL LOW (ref 3.87–5.11)
RETIC COUNT ABSOLUTE: 109.8 10*3/uL (ref 19.0–186.0)
Retic Ct Pct: 3.2 % — ABNORMAL HIGH (ref 0.4–3.1)

## 2014-03-25 LAB — PREGNANCY, URINE: Preg Test, Ur: NEGATIVE

## 2014-03-25 MED ORDER — FENTANYL CITRATE 0.05 MG/ML IJ SOLN
50.0000 ug | Freq: Once | INTRAMUSCULAR | Status: AC
  Administered 2014-03-25: 50 ug via INTRAVENOUS
  Filled 2014-03-25: qty 2

## 2014-03-25 MED ORDER — SODIUM CHLORIDE 0.9 % IV BOLUS (SEPSIS)
1000.0000 mL | Freq: Once | INTRAVENOUS | Status: AC
  Administered 2014-03-25: 1000 mL via INTRAVENOUS

## 2014-03-25 MED ORDER — HYDROMORPHONE HCL 1 MG/ML IJ SOLN
2.0000 mg | Freq: Once | INTRAMUSCULAR | Status: AC
  Administered 2014-03-25: 2 mg via INTRAVENOUS

## 2014-03-25 MED ORDER — KETOROLAC TROMETHAMINE 30 MG/ML IJ SOLN
30.0000 mg | Freq: Once | INTRAMUSCULAR | Status: AC
  Administered 2014-03-25: 30 mg via INTRAVENOUS
  Filled 2014-03-25: qty 1

## 2014-03-25 MED ORDER — HYDROMORPHONE HCL 1 MG/ML IJ SOLN
INTRAMUSCULAR | Status: AC
  Filled 2014-03-25: qty 2

## 2014-03-25 MED ORDER — HYDROMORPHONE HCL 1 MG/ML IJ SOLN
2.0000 mg | Freq: Once | INTRAMUSCULAR | Status: AC
  Administered 2014-03-25: 2 mg via INTRAVENOUS
  Filled 2014-03-25: qty 2

## 2014-03-25 MED ORDER — SODIUM CHLORIDE 0.9 % IV SOLN
1000.0000 mL | Freq: Once | INTRAVENOUS | Status: AC
  Administered 2014-03-25: 1000 mL via INTRAVENOUS

## 2014-03-25 MED ORDER — SODIUM CHLORIDE 0.9 % IV SOLN
1000.0000 mL | INTRAVENOUS | Status: DC
  Administered 2014-03-25: 1000 mL via INTRAVENOUS

## 2014-03-25 NOTE — ED Provider Notes (Signed)
CSN: [id number]     Arrival date & time 03/25/14  [id number] History   First MD Initiated Contact with Patient 03/25/14 0217     Chief Complaint  Patient presents with  . Sickle Cell Pain Crisis     (Consider location/radiation/quality/duration/timing/severity/associated sxs/prior Treatment) Patient is a 43 y.o. female presenting with sickle cell pain. The history is provided by the patient.  Sickle Cell Pain Crisis She had sudden onset about 11:30 PM of severe pain in her right leg consistent with her sickle cell crises. She rates pain a 10/10. She took morphine for pain without relief. She denies fever, chills, sweats. She denies chest pain. She denies cough. She denies nausea, vomiting, diarrhea.  Past Medical History  Diagnosis Date  . Sickle cell crisis    Past Surgical History  Procedure Laterality Date  . Appendectomy    . Other surgical history      c-section  . Cesarean section     History reviewed. No pertinent family history. History  Substance Use Topics  . Smoking status: Current Every Day Smoker -- 0.00 packs/day    Types: Cigars  . Smokeless tobacco: Never Used  . Alcohol Use: Yes     Comment: occasionally, 2 drinks twice a month   OB History    No data available     Review of Systems  All other systems reviewed and are negative.     Allergies  Review of patient's allergies indicates no known allergies.  Home Medications   Prior to Admission medications   Medication Sig Start Date End Date Taking? Authorizing Provider  Aspirin-Acetaminophen-Caffeine (GOODY HEADACHE PO) Take 1 Package by mouth 2 (two) times daily as needed. PAIN   Yes Historical Provider, MD  folic acid (FOLVITE) 1 MG tablet Take 1 mg by mouth daily.   Yes Historical Provider, MD  morphine (MSIR) 30 MG tablet Take 30 mg by mouth every 4 (four) hours as needed. For pain    Yes Historical Provider, MD  morphine (MS CONTIN) 60 MG 12 hr tablet Take 1 tablet (60 mg total) by mouth 2 (two)  times daily. Patient not taking: Reported on 03/25/2014 07/21/12   Alinda MoneyNovlet J Bjorkman, MD  traMADol (ULTRAM) 50 MG tablet Take 1 tablet (50 mg total) by mouth once. Patient not taking: Reported on 03/25/2014 11/17/12   Victorino DikeJennifer L Piepenbrink, PA-C   BP 113/72 mmHg  Pulse 96  Temp(Src) 99.2 F (37.3 C) (Oral)  Resp 21  Ht 5' (1.524 m)  Wt 156 lb (70.761 kg)  BMI 30.47 kg/m2  SpO2 100%  LMP 02/20/2014 Physical Exam  Nursing note and vitals reviewed.  43 year old female, appears to be in pain, but his in no acute distress. Vital signs are normal. Oxygen saturation is 100%, which is normal. Head is normocephalic and atraumatic. PERRLA, EOMI. Oropharynx is clear. Neck is nontender and supple without adenopathy or JVD. Back is nontender and there is no CVA tenderness. Lungs are clear without rales, wheezes, or rhonchi. Chest is nontender. Heart has regular rate and rhythm without murmur. Abdomen is soft, flat, nontender without masses or hepatosplenomegaly and peristalsis is normoactive. Extremities have no cyanosis or edema, full range of motion is present. Skin is warm and dry without rash. Neurologic: Mental status is normal, cranial nerves are intact, there are no motor or sensory deficits.  ED Course  Procedures (including critical care time) Labs Review Results for orders placed or performed during the hospital encounter of 03/25/14  CBC WITH  DIFFERENTIAL  Result Value Ref Range   WBC 15.3 (H) 4.0 - 10.5 K/uL   RBC 3.43 (L) 3.87 - 5.11 MIL/uL   Hemoglobin 10.6 (L) 12.0 - 15.0 g/dL   HCT 16.128.6 (L) 09.636.0 - 04.546.0 %   MCV 83.4 78.0 - 100.0 fL   MCH 31.1 26.0 - 34.0 pg   MCHC 37.1 (H) 30.0 - 36.0 g/dL   RDW 40.915.2 81.111.5 - 91.415.5 %   Platelets 457 (H) 150 - 400 K/uL   Neutrophils Relative % 72 43 - 77 %   Neutro Abs 11.0 (H) 1.7 - 7.7 K/uL   Lymphocytes Relative 21 12 - 46 %   Lymphs Abs 3.2 0.7 - 4.0 K/uL   Monocytes Relative 5 3 - 12 %   Monocytes Absolute 0.8 0.1 - 1.0 K/uL    Eosinophils Relative 2 0 - 5 %   Eosinophils Absolute 0.3 0.0 - 0.7 K/uL   Basophils Relative 0 0 - 1 %   Basophils Absolute 0.0 0.0 - 0.1 K/uL  Comprehensive metabolic panel  Result Value Ref Range   Sodium 140 137 - 147 mEq/L   Potassium 4.3 3.7 - 5.3 mEq/L   Chloride 104 96 - 112 mEq/L   CO2 23 19 - 32 mEq/L   Glucose, Bld 95 70 - 99 mg/dL   BUN 6 6 - 23 mg/dL   Creatinine, Ser 7.820.73 0.50 - 1.10 mg/dL   Calcium 9.2 8.4 - 95.610.5 mg/dL   Total Protein 7.6 6.0 - 8.3 g/dL   Albumin 4.2 3.5 - 5.2 g/dL   AST 18 0 - 37 U/L   ALT 13 0 - 35 U/L   Alkaline Phosphatase 41 39 - 117 U/L   Total Bilirubin 1.1 0.3 - 1.2 mg/dL   GFR calc non Af Amer >90 >90 mL/min   GFR calc Af Amer >90 >90 mL/min   Anion gap 13 5 - 15  Reticulocytes  Result Value Ref Range   Retic Ct Pct 3.2 (H) 0.4 - 3.1 %   RBC. 3.43 (L) 3.87 - 5.11 MIL/uL   Retic Count, Manual 109.8 19.0 - 186.0 K/uL  Pregnancy, urine (M[facility])  Result Value Ref Range   Preg Test, Ur NEGATIVE NEGATIVE   CRITICAL CARE Performed by: OZHYQ,MVHQIGLICK,Sharma Lawrance Total critical care time: 45 minutes Critical care time was exclusive of separately billable procedures and treating other patients. Critical care was necessary to treat or prevent imminent or life-threatening deterioration. Critical care was time spent personally by me on the following activities: development of treatment plan with patient and/or surrogate as well as nursing, discussions with consultants, evaluation of patient's response to treatment, examination of patient, obtaining history from patient or surrogate, ordering and performing treatments and interventions, ordering and review of laboratory studies, ordering and review of radiographic studies, pulse oximetry and re-evaluation of patient's condition.  MDM   Final diagnoses:  Sickle cell crisis  Transient hypotension    Sickle cell anemia with vaso-occlusive crisis. She will be given IV fluids, hydromorphone, ketorolac. Old records  are reviewed and she has not been in the ED for 3 months. She does have multiple ED visits and hospitalizations for sickle cell disease.  She had incomplete relief of pain with single dose of hydromorphone. She was given a second dose and following that, she said that she still needed some more medication but she was noted to be hypotensive with blood pressure in the mid 70s. She was given 2 L of fluid and blood pressure  continued to be in the mid 70s. She was given a third liter of fluid and blood pressure came up to 110 systolic. At this point, she was reassessed and she stated that she was pain-free. She is discharged with instructions to follow-up with her PCP.  Dione Booze, MD 03/25/14 [phone]

## 2014-03-25 NOTE — ED Notes (Signed)
Pt very tearfully reports sickle cell crisis for two hours. Pt states pain is in right leg. No relief from home medications.

## 2014-03-25 NOTE — Discharge Instructions (Signed)
Sickle Cell Anemia, Adult °Sickle cell anemia is a condition in which red blood cells have an abnormal "sickle" shape. This abnormal shape shortens the cells' life span, which results in a lower than normal concentration of red blood cells in the blood. The sickle shape also causes the cells to clump together and block free blood flow through the blood vessels. As a result, the tissues and organs of the body do not receive enough oxygen. Sickle cell anemia causes organ damage and pain and increases the risk of infection. °CAUSES  °Sickle cell anemia is a genetic disorder. Those who receive two copies of the gene have the condition, and those who receive one copy have the trait. °RISK FACTORS °The sickle cell gene is most common in people whose families originated in [place]. Other areas of the globe where sickle cell trait occurs include the Mediterranean, [place] and [place], the [place], and the [place].  °SIGNS AND SYMPTOMS °· Pain, especially in the extremities, back, chest, or abdomen (common). The pain may start suddenly or may develop following an illness, especially if there is dehydration. Pain can also occur due to overexertion or exposure to extreme temperature changes. °· Frequent severe bacterial infections, especially certain types of pneumonia and meningitis. °· Pain and swelling in the hands and feet. °· Decreased activity.   °· Loss of appetite.   °· Change in behavior. °· Headaches. °· Seizures. °· Shortness of breath or difficulty breathing. °· Vision changes. °· Skin ulcers. °Those with the trait may not have symptoms or they may have mild symptoms.  °DIAGNOSIS  °Sickle cell anemia is diagnosed with blood tests that demonstrate the genetic trait. It is often diagnosed during the newborn period, due to mandatory testing nationwide. A variety of blood tests, X-rays, CT scans, MRI scans, ultrasounds, and lung function tests may also be done to monitor the condition. °TREATMENT  °Sickle  cell anemia may be treated with: °· Medicines. You may be given pain medicines, antibiotic medicines (to treat and prevent infections) or medicines to increase the production of certain types of hemoglobin. °· Fluids. °· Oxygen. °· Blood transfusions. °HOME CARE INSTRUCTIONS  °· Drink enough fluid to keep your urine clear or pale yellow. Increase your fluid intake in hot weather and during exercise. °· Do not smoke. Smoking lowers oxygen levels in the blood.   °· Only take over-the-counter or prescription medicines for pain, fever, or discomfort as directed by your health care provider. °· Take antibiotics as directed by your health care provider. Make sure you finish them it even if you start to feel better.   °· Take supplements as directed by your health care provider.   °· Consider wearing a medical alert bracelet. This tells anyone caring for you in an emergency of your condition.   °· When traveling, keep your medical information, health care provider's names, and the medicines you take with you at all times.   °· If you develop a fever, do not take medicines to reduce the fever right away. This could cover up a problem that is developing. Notify your health care provider. °· Keep all follow-up appointments with your health care provider. Sickle cell anemia requires regular medical care. °SEEK MEDICAL CARE IF: ° You have a fever. °SEEK IMMEDIATE MEDICAL CARE IF:  °· You feel dizzy or faint.   °· You have new abdominal pain, especially on the left side near the stomach area.   °· You develop a persistent, often uncomfortable and painful penile erection (priapism). If this is not treated immediately it   will lead to impotence.   °· You have numbness your arms or legs or you have a hard time moving them.   °· You have a hard time with speech.   °· You have a fever or persistent symptoms for more than 2-3 days.   °· You have a fever and your symptoms suddenly get worse.   °· You have signs or symptoms of infection.  These include:   °¨ Chills.   °¨ Abnormal tiredness (lethargy).   °¨ Irritability.   °¨ Poor eating.   °¨ Vomiting.   °· You develop pain that is not helped with medicine.   °· You develop shortness of breath. °· You have pain in your chest.   °· You are coughing up pus-like or bloody sputum.   °· You develop a stiff neck. °· Your feet or hands swell or have pain. °· Your abdomen appears bloated. °· You develop joint pain. °MAKE SURE YOU: °· Understand these instructions. °Document Released: 08/03/2005 Document Revised: 09/09/2013 Document Reviewed: 12/05/2012 °[employer]® Patient Information ©2015 [employer]. This information is not intended to replace advice given to you by your health care provider. Make sure you discuss any questions you have with your health care provider. ° °

## 2014-12-18 ENCOUNTER — Encounter (HOSPITAL_COMMUNITY): Payer: Self-pay | Admitting: Emergency Medicine

## 2014-12-18 ENCOUNTER — Emergency Department (HOSPITAL_COMMUNITY)
Admission: EM | Admit: 2014-12-18 | Discharge: 2014-12-18 | Disposition: A | Discharge status: Home / Self Care | Payer: Self-pay | Attending: Emergency Medicine | Admitting: Emergency Medicine

## 2014-12-18 DIAGNOSIS — Y9389 Activity, other specified: Secondary | ICD-10-CM | POA: Insufficient documentation

## 2014-12-18 DIAGNOSIS — Z7982 Long term (current) use of aspirin: Secondary | ICD-10-CM | POA: Insufficient documentation

## 2014-12-18 DIAGNOSIS — Y9289 Other specified places as the place of occurrence of the external cause: Secondary | ICD-10-CM | POA: Insufficient documentation

## 2014-12-18 DIAGNOSIS — Z720 Tobacco use: Secondary | ICD-10-CM | POA: Insufficient documentation

## 2014-12-18 DIAGNOSIS — S40862A Insect bite (nonvenomous) of left upper arm, initial encounter: Secondary | ICD-10-CM | POA: Insufficient documentation

## 2014-12-18 DIAGNOSIS — D5700 Hb-SS disease with crisis, unspecified: Secondary | ICD-10-CM | POA: Insufficient documentation

## 2014-12-18 DIAGNOSIS — Y998 Other external cause status: Secondary | ICD-10-CM | POA: Insufficient documentation

## 2014-12-18 DIAGNOSIS — W57XXXA Bitten or stung by nonvenomous insect and other nonvenomous arthropods, initial encounter: Secondary | ICD-10-CM | POA: Insufficient documentation

## 2014-12-18 DIAGNOSIS — L089 Local infection of the skin and subcutaneous tissue, unspecified: Secondary | ICD-10-CM

## 2014-12-18 MED ORDER — CLINDAMYCIN HCL 300 MG PO CAPS: 300.0000 mg | ORAL_CAPSULE | Freq: Three times a day (TID) | ORAL | Status: DC

## 2014-12-18 MED ORDER — DIPHENHYDRAMINE HCL 25 MG PO CAPS
50.0000 mg | ORAL_CAPSULE | Freq: Once | ORAL | Status: AC
  Administered 2014-12-18: 50 mg via ORAL
  Filled 2014-12-18: qty 2

## 2014-12-18 NOTE — ED Notes (Signed)
Pt. reports insect bite at left posterior upper arm this morning with swelling and itching .

## 2014-12-18 NOTE — Discharge Instructions (Signed)

## 2014-12-18 NOTE — ED Provider Notes (Signed)
CSN: [id number]     Arrival date & time 12/18/14  1924 History  This chart was scribed for non-physician practitioner, Teressa Lower, NP with Lyndal Pulley, MD , by Jarvis Morgan, ED Scribe. This patient was seen in room TR06C/TR06C and the patient's care was started at 7:49 PM.    Chief Complaint  Patient presents with  . Insect Bite    The history is provided by the patient. No language interpreter was used.    HPI Comments: Lindsey Braun [name] is a 44 y.o. female who presents to the Emergency Department complaining of a possible insect bite to her left posterior upper arm onset this morning. She reports associated gradually worsening, swelling and itching to the area. She is not sure what may have bitten her but states she woke up this morning with the bite. She has not taking any medication for relief. Pt denies any h/o DM. She denies any known allergies. Pt notes she had a similar area on her back 2 weeks ago but states it was not as bad. She denies any other complaints at this time.   Past Medical History  Diagnosis Date  . Sickle cell crisis    Past Surgical History  Procedure Laterality Date  . Appendectomy    . Other surgical history      c-section  . Cesarean section     No family history on file. Social History  Substance Use Topics  . Smoking status: Current Every Day Smoker -- 0.00 packs/day    Types: Cigars  . Smokeless tobacco: Never Used  . Alcohol Use: Yes   OB History    No data available     Review of Systems  Skin: Positive for color change.       Positive for insect bite  All other systems reviewed and are negative.     Allergies  Review of patient's allergies indicates no known allergies.  Home Medications   Prior to Admission medications   Medication Sig Start Date End Date Taking? Authorizing Provider  Aspirin-Acetaminophen-Caffeine (GOODY HEADACHE PO) Take 1 Package by mouth 2 (two) times daily as needed. PAIN    Historical Provider, MD   folic acid (FOLVITE) 1 MG tablet Take 1 mg by mouth daily.    Historical Provider, MD  morphine (MS CONTIN) 60 MG 12 hr tablet Take 1 tablet (60 mg total) by mouth 2 (two) times daily. Patient not taking: Reported on 03/25/2014 07/21/12   Alinda Money, MD  morphine (MSIR) 30 MG tablet Take 30 mg by mouth every 4 (four) hours as needed. For pain     Historical Provider, MD  traMADol (ULTRAM) 50 MG tablet Take 1 tablet (50 mg total) by mouth once. Patient not taking: Reported on 03/25/2014 11/17/12   Francee Piccolo, PA-C   Triage Vitals: BP 112/77 mmHg  Pulse 107  Temp(Src) 99.3 F (37.4 C)  Resp 16  Ht 5' (1.524 m)  Wt 145 lb (65.772 kg)  BMI 28.32 kg/m2  SpO2 98%  LMP 11/21/2014  Physical Exam  Constitutional: She is oriented to person, place, and time. She appears well-developed and well-nourished. No distress.  HENT:  Head: Normocephalic and atraumatic.  Eyes: Conjunctivae and EOM are normal.  Neck: Neck supple. No tracheal deviation present.  Cardiovascular: Normal rate.   Pulmonary/Chest: Effort normal. No respiratory distress.  Musculoskeletal: Normal range of motion.  Neurological: She is alert and oriented to person, place, and time.  Skin:  Localized area of redness and swelling to  the posterior aspect aspect of the left upper arm. No fluctuance of firmness noted  Psychiatric: She has a normal mood and affect. Her behavior is normal.  Nursing note and vitals reviewed.   ED Course  Procedures (including critical care time)  DIAGNOSTIC STUDIES: Oxygen Saturation is 98% on RA, normal by my interpretation.    COORDINATION OF CARE: 7:56 PM- Will prescribe Benadryl and Clindamycin . Also told her to apply ice for itch relief. Pt advised of plan for treatment and pt agrees.  Labs Review Labs Reviewed - No data to display  Imaging Review No results found.    EKG Interpretation None      MDM   Final diagnoses:  Insect bite of arm, left, infected,  initial encounter    Allergic reaction vs localized reaction. No sign of definite abscess. Discussed using benadryl and if symptoms don't resolve using the clindamycin. Discussed return precautions.   I personally performed the services described in this documentation, which was scribed in my presence. The recorded information has been reviewed and is accurate.     Teressa Lower, NP 12/18/14 2039  Lyndal Pulley, MD 12/19/14 [place]

## 2015-05-06 ENCOUNTER — Emergency Department (INDEPENDENT_AMBULATORY_CARE_PROVIDER_SITE_OTHER)
Admission: EM | Admit: 2015-05-06 | Discharge: 2015-05-06 | Disposition: A | Discharge status: Home / Self Care | Payer: 59 | Source: Home / Self Care | Attending: Family Medicine | Admitting: Family Medicine

## 2015-05-06 ENCOUNTER — Encounter (HOSPITAL_COMMUNITY): Payer: Self-pay | Admitting: Emergency Medicine

## 2015-05-06 DIAGNOSIS — L0231 Cutaneous abscess of buttock: Secondary | ICD-10-CM | POA: Diagnosis not present

## 2015-05-06 MED ORDER — DOXYCYCLINE HYCLATE 100 MG PO CAPS: 100.0000 mg | ORAL_CAPSULE | Freq: Two times a day (BID) | ORAL | Status: DC

## 2015-05-06 NOTE — ED Provider Notes (Signed)
CSN: [id number]     Arrival date & time 05/06/15  1757 History   None    Chief Complaint  Patient presents with  . Insect Bite   (Consider location/radiation/quality/duration/timing/severity/associated sxs/prior Treatment) Patient is a 44 y.o. female presenting with abscess. The history is provided by the patient.  Abscess Location:  Ano-genital Ano-genital abscess location:  R buttock Abscess quality: induration, itching and painful   Abscess quality: not draining, no fluctuance and no redness   Red streaking: no   Duration:  2 days Progression:  Unchanged Pain details:    Severity:  Moderate   Progression:  Worsening Chronicity:  New Context: not insect bite/sting and not skin injury   Relieved by:  Warm compresses Associated symptoms: no fever   Risk factors: no hx of MRSA and no prior abscess     Past Medical History  Diagnosis Date  . Sickle cell crisis [facility](HCC)    Past Surgical History  Procedure Laterality Date  . Appendectomy    . Other surgical history      c-section  . Cesarean section     No family history on file. Social History  Substance Use Topics  . Smoking status: Current Every Day Smoker -- 0.00 packs/day    Types: Cigars  . Smokeless tobacco: Never Used  . Alcohol Use: Yes   OB History    No data available     Review of Systems  Constitutional: Negative.  Negative for fever.  Musculoskeletal: Negative.   Skin: Positive for rash.  All other systems reviewed and are negative.   Allergies  Review of patient's allergies indicates no known allergies.  Home Medications   Prior to Admission medications   Medication Sig Start Date End Date Taking? Authorizing Provider  NAPROXEN PO Take by mouth.   Yes Historical Provider, MD  Aspirin-Acetaminophen-Caffeine (GOODY HEADACHE PO) Take 1 Package by mouth 2 (two) times daily as needed. PAIN    Historical Provider, MD  clindamycin (CLEOCIN) 300 MG capsule Take 1 capsule (300 mg total) by mouth 3  (three) times daily. Patient not taking: Reported on 05/06/2015 12/18/14   Teressa LowerVrinda Pickering, NP  doxycycline (VIBRAMYCIN) 100 MG capsule Take 1 capsule (100 mg total) by mouth 2 (two) times daily. 05/06/15   Linna HoffJames D Gurneet Matarese, MD  folic acid (FOLVITE) 1 MG tablet Take 1 mg by mouth daily.    Historical Provider, MD  morphine (MS CONTIN) 60 MG 12 hr tablet Take 1 tablet (60 mg total) by mouth 2 (two) times daily. Patient not taking: Reported on 03/25/2014 07/21/12   Alinda MoneyNovlet J Klose, MD  morphine (MSIR) 30 MG tablet Take 30 mg by mouth every 4 (four) hours as needed. For pain     Historical Provider, MD  traMADol (ULTRAM) 50 MG tablet Take 1 tablet (50 mg total) by mouth once. Patient not taking: Reported on 03/25/2014 11/17/12   Francee PiccoloJennifer Piepenbrink, PA-C   Meds Ordered and Administered this Visit  Medications - No data to display  BP 111/74 mmHg  Pulse 94  Temp(Src) 98.1 F (36.7 C) (Oral)  Resp 17  SpO2 99% No data found.   Physical Exam  Constitutional: She is oriented to person, place, and time. She appears well-developed and well-nourished. No distress.  Abdominal: Soft. Bowel sounds are normal.  Musculoskeletal: Normal range of motion.  Neurological: She is alert and oriented to person, place, and time.  Skin: Skin is warm and dry. Rash noted.  Tender sl indurated skin area to rightmid  buttock, no drainage.  Nursing note and vitals reviewed.   ED Course  Procedures (including critical care time)  Labs Review Labs Reviewed - No data to display  Imaging Review No results found.   Visual Acuity Review  Right Eye Distance:   Left Eye Distance:   Bilateral Distance:    Right Eye Near:   Left Eye Near:    Bilateral Near:         MDM   1. Abscess of buttock, right        Linna Hoff, MD 05/06/15 [phone]

## 2015-05-06 NOTE — ED Notes (Signed)
Reports boil on right buttocks, noticed Tuesday morning .  Has increased in size since onset.

## 2015-05-06 NOTE — Discharge Instructions (Signed)
Warm compress twice a day when you take the antibiotic, take all of medicine, return as needed. °

## 2016-06-26 ENCOUNTER — Ambulatory Visit (HOSPITAL_COMMUNITY)
Admission: EM | Admit: 2016-06-26 | Discharge: 2016-06-26 | Disposition: A | Discharge status: Home / Self Care | Payer: 59 | Attending: Family Medicine | Admitting: Family Medicine

## 2016-06-26 DIAGNOSIS — W57XXXA Bitten or stung by nonvenomous insect and other nonvenomous arthropods, initial encounter: Secondary | ICD-10-CM | POA: Diagnosis not present

## 2016-06-26 DIAGNOSIS — L03113 Cellulitis of right upper limb: Secondary | ICD-10-CM | POA: Diagnosis not present

## 2016-06-26 DIAGNOSIS — R6889 Other general symptoms and signs: Secondary | ICD-10-CM | POA: Diagnosis not present

## 2016-06-26 DIAGNOSIS — R05 Cough: Secondary | ICD-10-CM | POA: Diagnosis not present

## 2016-06-26 DIAGNOSIS — R059 Cough, unspecified: Secondary | ICD-10-CM

## 2016-06-26 MED ORDER — ACETAMINOPHEN 325 MG PO TABS
650.0000 mg | ORAL_TABLET | Freq: Once | ORAL | Status: AC
  Administered 2016-06-26: 650 mg via ORAL

## 2016-06-26 MED ORDER — BENZONATATE 100 MG PO CAPS: 100.0000 mg | ORAL_CAPSULE | Freq: Three times a day (TID) | ORAL | 0 refills | Status: DC

## 2016-06-26 MED ORDER — ACETAMINOPHEN 325 MG PO TABS
ORAL_TABLET | ORAL | Status: AC
  Filled 2016-06-26: qty 2

## 2016-06-26 MED ORDER — OSELTAMIVIR PHOSPHATE 75 MG PO CAPS: 75.0000 mg | ORAL_CAPSULE | Freq: Two times a day (BID) | ORAL | 0 refills | Status: DC

## 2016-06-26 MED ORDER — DOXYCYCLINE HYCLATE 100 MG PO CAPS: 100.0000 mg | ORAL_CAPSULE | Freq: Two times a day (BID) | ORAL | 0 refills | Status: DC

## 2016-06-26 NOTE — ED Triage Notes (Signed)
C/o flu like sx since Tuesday  States right arm is swelling  Thinks its a insect bite  Admit to itching

## 2016-06-26 NOTE — ED Provider Notes (Signed)
CSN: [id number]     Arrival date & time 06/26/16  1350 History   First MD Initiated Contact with Patient 06/26/16 1506     Chief Complaint  Patient presents with  . Influenza  . Insect Bite   (Consider location/radiation/quality/duration/timing/severity/associated sxs/prior Treatment) Patient c/o flu like sx's and right arm swelling due to insect bite.  She states she has been running fever for 2 days.   The history is provided by the patient.  Influenza  Presenting symptoms: cough, fatigue and fever   Severity:  Moderate Onset quality:  Sudden Chronicity:  New Relieved by:  Nothing Worsened by:  Nothing Ineffective treatments:  None tried Associated symptoms: chills and nasal congestion     Past Medical History:  Diagnosis Date  . Sickle cell crisis [facility](HCC)    Past Surgical History:  Procedure Laterality Date  . APPENDECTOMY    . CESAREAN SECTION    . OTHER SURGICAL HISTORY     c-section   No family history on file. Social History  Substance Use Topics  . Smoking status: Current Every Day Smoker    Packs/day: 0.00    Types: Cigars  . Smokeless tobacco: Never Used  . Alcohol use Yes   OB History    No data available     Review of Systems  Constitutional: Positive for chills, fatigue and fever.  HENT: Positive for congestion.   Eyes: Negative.   Respiratory: Positive for cough.   Cardiovascular: Negative.   Gastrointestinal: Negative.   Endocrine: Negative.   Genitourinary: Negative.   Musculoskeletal: Negative.   Allergic/Immunologic: Negative.   Neurological: Negative.   Hematological: Negative.   Psychiatric/Behavioral: Negative.     Allergies  Patient has no known allergies.  Home Medications   Prior to Admission medications   Medication Sig Start Date End Date Taking? Authorizing Provider  Aspirin-Acetaminophen-Caffeine (GOODY HEADACHE PO) Take 1 Package by mouth 2 (two) times daily as needed. PAIN   Yes Historical Provider, MD  morphine (MS  CONTIN) 60 MG 12 hr tablet Take 1 tablet (60 mg total) by mouth 2 (two) times daily. 07/21/12  Yes Novlet Adelina MingsJ Johansson, MD  morphine (MSIR) 30 MG tablet Take 30 mg by mouth every 4 (four) hours as needed. For pain    Yes Historical Provider, MD  NAPROXEN PO Take by mouth.   Yes Historical Provider, MD  benzonatate (TESSALON) 100 MG capsule Take 1 capsule (100 mg total) by mouth every 8 (eight) hours. 06/26/16   Deatra CanterWilliam J Jeniya Flannigan, FNP  clindamycin (CLEOCIN) 300 MG capsule Take 1 capsule (300 mg total) by mouth 3 (three) times daily. Patient not taking: Reported on 05/06/2015 12/18/14   Teressa LowerVrinda Pickering, NP  doxycycline (VIBRAMYCIN) 100 MG capsule Take 1 capsule (100 mg total) by mouth 2 (two) times daily. 05/06/15   Linna HoffJames D Kindl, MD  doxycycline (VIBRAMYCIN) 100 MG capsule Take 1 capsule (100 mg total) by mouth 2 (two) times daily. 06/26/16   Deatra CanterWilliam J Raylen Tangonan, FNP  folic acid (FOLVITE) 1 MG tablet Take 1 mg by mouth daily.    Historical Provider, MD  oseltamivir (TAMIFLU) 75 MG capsule Take 1 capsule (75 mg total) by mouth every 12 (twelve) hours. 06/26/16   Deatra CanterWilliam J Jacen Carlini, FNP  traMADol (ULTRAM) 50 MG tablet Take 1 tablet (50 mg total) by mouth once. Patient not taking: Reported on 03/25/2014 11/17/12   Francee PiccoloJennifer Piepenbrink, PA-C   Meds Ordered and Administered this Visit   Medications  acetaminophen (TYLENOL) tablet 650 mg (650  mg Oral Given 06/26/16 1511)    BP 113/61 (BP Location: Left Arm)   Pulse 74   Temp 101 F (38.3 C) (Oral)   Resp 16   LMP 06/19/2016   SpO2 100%  No data found.   Physical Exam  Constitutional: She appears well-developed and well-nourished.  HENT:  Head: Normocephalic.  Right Ear: External ear normal.  Left Ear: External ear normal.  Mouth/Throat: Oropharynx is clear and moist.  Eyes: Conjunctivae and EOM are normal. Pupils are equal, round, and reactive to light.  Neck: Normal range of motion. Neck supple.  Cardiovascular: Normal rate, regular rhythm and normal  heart sounds.   Pulmonary/Chest: Effort normal and breath sounds normal.  Nursing note and vitals reviewed.   Urgent Care Course     Procedures (including critical care time)  Labs Review Labs Reviewed - No data to display  Imaging Review No results found.   Visual Acuity Review  Right Eye Distance:   Left Eye Distance:   Bilateral Distance:    Right Eye Near:   Left Eye Near:    Bilateral Near:         MDM   1. Flu-like symptoms   2. Cough   3. Insect bite, initial encounter   4. Cellulitis of right upper extremity    Tamiflu 75mg  one po bid x 5 days #10 Doxycycline 100mg  one po bid x 5 days #10  Push po fluids, rest, tylenol and motrin otc prn as directed for fever, arthralgias, and myalgias.  Follow up prn if sx's continue or persist.    Deatra Canter, FNP 06/26/16 1606

## 2016-08-24 ENCOUNTER — Emergency Department (HOSPITAL_COMMUNITY)
Admission: EM | Admit: 2016-08-24 | Discharge: 2016-08-24 | Disposition: A | Discharge status: Home / Self Care | Payer: 59 | Attending: Emergency Medicine | Admitting: Emergency Medicine

## 2016-08-24 DIAGNOSIS — Y939 Activity, unspecified: Secondary | ICD-10-CM | POA: Insufficient documentation

## 2016-08-24 DIAGNOSIS — L03113 Cellulitis of right upper limb: Secondary | ICD-10-CM

## 2016-08-24 DIAGNOSIS — S30861A Insect bite (nonvenomous) of abdominal wall, initial encounter: Secondary | ICD-10-CM | POA: Insufficient documentation

## 2016-08-24 DIAGNOSIS — S50862A Insect bite (nonvenomous) of left forearm, initial encounter: Secondary | ICD-10-CM | POA: Insufficient documentation

## 2016-08-24 DIAGNOSIS — W57XXXA Bitten or stung by nonvenomous insect and other nonvenomous arthropods, initial encounter: Secondary | ICD-10-CM | POA: Insufficient documentation

## 2016-08-24 DIAGNOSIS — Y929 Unspecified place or not applicable: Secondary | ICD-10-CM | POA: Insufficient documentation

## 2016-08-24 DIAGNOSIS — F17290 Nicotine dependence, other tobacco product, uncomplicated: Secondary | ICD-10-CM | POA: Insufficient documentation

## 2016-08-24 DIAGNOSIS — Y999 Unspecified external cause status: Secondary | ICD-10-CM | POA: Insufficient documentation

## 2016-08-24 DIAGNOSIS — Z7982 Long term (current) use of aspirin: Secondary | ICD-10-CM | POA: Insufficient documentation

## 2016-08-24 DIAGNOSIS — S40862A Insect bite (nonvenomous) of left upper arm, initial encounter: Secondary | ICD-10-CM | POA: Insufficient documentation

## 2016-08-24 DIAGNOSIS — S90862A Insect bite (nonvenomous), left foot, initial encounter: Secondary | ICD-10-CM | POA: Insufficient documentation

## 2016-08-24 MED ORDER — SULFAMETHOXAZOLE-TRIMETHOPRIM 800-160 MG PO TABS: 1.0000 | ORAL_TABLET | Freq: Two times a day (BID) | ORAL | 0 refills | Status: AC

## 2016-08-24 NOTE — ED Triage Notes (Signed)
Bug bite on left forearm, left upper arm - erythema noted. Patient has been scratching at bites on right ankle.

## 2016-08-24 NOTE — ED Provider Notes (Signed)
[facility]-EMERGENCY DEPT Provider Note   CSN: [id number] Arrival date & time: 08/24/16  1607  By signing my name below, I, Modena Jansky, attest that this documentation has been prepared under the direction and in the presence of non-physician practitioner, Michela Pitcher, PA-C. Electronically Signed: Modena Jansky, Scribe. 08/24/2016. 5:11 PM.  History   Chief Complaint Chief Complaint  Patient presents with  . Insect Bite    multiple bites on left arm, abdomen, right foot area. x 3 days   The history is provided by the patient. No language interpreter was used.   HPI Comments: Lindsey [name] is a 46 y.o. female who presents to the Emergency Department complaining of constant moderate rash that started about 3 days ago. She was seen at the Urgent Care for influenza and given Doxycyline for her rash in February. She states first noticed insect bites on her LUE, and then to her abdomen and right foot/ankle last night. She states she has been noticing this rash intermittently for some time now. Rash will present in different areas at different times, usually on her extremities and abdomen. She tried Doxycycline twice daily for the last 7 days without any relief. She describes the areas as itchy and tender (only on LUE). Her husband, who shares her bed, does not have similar rash. She believes these are insect bites but has not seen an insect bite her.   Denies any new hygiene products, new medications, recent camping or extended time spent outdoors in a wooded area, chest pain, SOB, nausea, vomiting, diarrhea, dizziness, LOC, or other complaints at this time.    PCP: Billee Cashing, MD  Past Medical History:  Diagnosis Date  . Sickle cell crisis [facility])     Patient Active Problem List   Diagnosis Date Noted  . Sickle cell crisis (HCC) 07/20/2012  . Fever, unspecified 07/20/2012  . 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  APPENDECTOMY    . CESAREAN SECTION    . OTHER SURGICAL HISTORY     c-section    OB History    No data available       Home Medications    Prior to Admission medications   Medication Sig Start Date End Date Taking? Authorizing Provider  Aspirin-Acetaminophen-Caffeine (GOODY HEADACHE PO) Take 1 Package by mouth 2 (two) times daily as needed. PAIN    Historical Provider, MD  benzonatate (TESSALON) 100 MG capsule Take 1 capsule (100 mg total) by mouth every 8 (eight) hours. 06/26/16   Deatra Canter, FNP  clindamycin (CLEOCIN) 300 MG capsule Take 1 capsule (300 mg total) by mouth 3 (three) times daily. Patient not taking: Reported on 05/06/2015 12/18/14   Teressa Lower, NP  doxycycline (VIBRAMYCIN) 100 MG capsule Take 1 capsule (100 mg total) by mouth 2 (two) times daily. 05/06/15   Linna Hoff, MD  doxycycline (VIBRAMYCIN) 100 MG capsule Take 1 capsule (100 mg total) by mouth 2 (two) times daily. 06/26/16   Deatra Canter, FNP  folic acid (FOLVITE) 1 MG tablet Take 1 mg by mouth daily.    Historical Provider, MD  morphine (MS CONTIN) 60 MG 12 hr tablet Take 1 tablet (60 mg total) by mouth 2 (two) times daily. 07/21/12   Novlet Adelina Mings, MD  morphine (MSIR) 30 MG tablet Take 30 mg by mouth every 4 (four) hours as needed. For pain     Historical Provider, MD  NAPROXEN PO Take  by mouth.    Historical Provider, MD  oseltamivir (TAMIFLU) 75 MG capsule Take 1 capsule (75 mg total) by mouth every 12 (twelve) hours. 06/26/16   Deatra Canter, FNP  sulfamethoxazole-trimethoprim (BACTRIM DS,SEPTRA DS) 800-160 MG tablet Take 1 tablet by mouth 2 (two) times daily. 08/24/16 08/31/16  Kin Galbraith A Allora Bains, PA-C  traMADol (ULTRAM) 50 MG tablet Take 1 tablet (50 mg total) by mouth once. Patient not taking: Reported on 03/25/2014 11/17/12   Francee Piccolo, PA-C    Family History No family history on file.  Social History Social History  Substance Use Topics  . Smoking status: Current Every Day  Smoker    Packs/day: 0.00    Types: Cigars  . Smokeless tobacco: Never Used  . Alcohol use Yes     Allergies   Patient has no known allergies.   Review of Systems Review of Systems  Respiratory: Negative for shortness of breath.   Cardiovascular: Negative for chest pain.  Gastrointestinal: Negative for diarrhea, nausea and vomiting.  Skin: Positive for color change and rash.  Neurological: Negative for dizziness and syncope.     Physical Exam Updated Vital Signs BP 129/67 (BP Location: Left Arm)   Pulse 88   Temp 98.7 F (37.1 C) (Oral)   Resp 20   Ht 5' (1.524 m)   Wt 160 lb (72.6 kg)   LMP 08/24/2016   SpO2 100%   BMI 31.25 kg/m   Physical Exam  Constitutional: She appears well-developed and well-nourished. No distress.  HENT:  Head: Normocephalic.  Eyes: Conjunctivae are normal.  Neck: Neck supple. No JVD present. No tracheal deviation present.  Cardiovascular: Normal rate and regular rhythm.   Pulmonary/Chest: Effort normal.  Abdominal: Soft. Bowel sounds are normal. She exhibits no distension.  Musculoskeletal: Normal range of motion.  Neurological: She is alert.  Skin: Skin is warm and dry. There is erythema.  Multiple small (2mm x 2mm) circular raised areas along left upper arm and abdomen with no surrounding erythema. Small raised area along left forearm with surrounding erythema 2cm x 3cm, tender to touch. No fluctuance or drainage. There is a blister along left great toe, skin intact and not draining. Multiple excoriations and scabs along left foot and ankle where pt states she has been itching. No linear burrows along interdigital areas, vesicles, or desquamation noted  Psychiatric: She has a normal mood and affect.  Nursing note and vitals reviewed.    ED Treatments / Results  DIAGNOSTIC STUDIES: Oxygen Saturation is 100% on RA, normal by my interpretation.    COORDINATION OF CARE: 5:15 PM- Pt advised of plan for treatment and pt  agrees.  Labs (all labs ordered are listed, but only abnormal results are displayed) Labs Reviewed - No data to display  EKG  EKG Interpretation None       Radiology No results found.  Procedures Procedures (including critical care time)  Medications Ordered in ED Medications - No data to display   Initial Impression / Assessment and Plan / ED Course  I have reviewed the triage vital signs and the nursing notes.  Pertinent labs & imaging results that were available during my care of the patient were reviewed by me and considered in my medical decision making (see chart for details).     45yof with multiple small raised lesion to left upper arm and abdomen, with raised lesion and surrounding erythema of left forearm and singular bulla to left great toe. Excoriations noted along left foot. No  draining sinus tracts, no superficial abscesses, no bullous impetigo, no vesicles, no desquamation, no target lesions with dusky purpura or a central bulla. Suspect insect bites, possibly bed bugs, with subsequent development of cellulitis along bite on L forearm. Low suspicion abscess, erysipelas,  Patient denies any difficulty breathing or swallowing.  Pt has a patent airway without stridor and is handling secretions without difficulty; no angioedema. No concern for SJS, TEN, TSS, syphilis or other life-threatening condition. Will discharge home with bactrim and recommend Benadryl as needed for pruritis. Recommend evaluation with exterminator and follow up with PCP in 1 week if symptoms do not improve. Discussed strict ED return precautions. Pt verbalized understanding of and agreement with plan and is safe for discharge home at this time.  Final Clinical Impressions(s) / ED Diagnoses   Final diagnoses:  Cellulitis of arm, right  Insect bite, initial encounter    New Prescriptions Discharge Medication List as of 08/24/2016  5:28 PM    START taking these medications   Details   sulfamethoxazole-trimethoprim (BACTRIM DS,SEPTRA DS) 800-160 MG tablet Take 1 tablet by mouth 2 (two) times daily., Starting Wed 08/24/2016, Until Wed 08/31/2016, Print       I personally performed the services described in this documentation, which was scribed in my presence. The recorded information has been reviewed and is accurate.     Jeanie Sewer, PA-C 08/24/16 2216    Canary Brim Tegeler, MD 08/24/16 [phone]

## 2016-09-03 ENCOUNTER — Inpatient Hospital Stay (HOSPITAL_COMMUNITY)
Admission: EM | Admit: 2016-09-03 | Discharge: 2016-09-04 | DRG: 812 | Disposition: A | Discharge status: Home / Self Care | Payer: Self-pay | Attending: Internal Medicine | Admitting: Internal Medicine

## 2016-09-03 ENCOUNTER — Inpatient Hospital Stay (HOSPITAL_COMMUNITY): Payer: Self-pay

## 2016-09-03 ENCOUNTER — Encounter (HOSPITAL_COMMUNITY): Payer: Self-pay | Admitting: Emergency Medicine

## 2016-09-03 DIAGNOSIS — D72829 Elevated white blood cell count, unspecified: Secondary | ICD-10-CM

## 2016-09-03 DIAGNOSIS — R05 Cough: Secondary | ICD-10-CM

## 2016-09-03 DIAGNOSIS — Z79899 Other long term (current) drug therapy: Secondary | ICD-10-CM

## 2016-09-03 DIAGNOSIS — D5700 Hb-SS disease with crisis, unspecified: Principal | ICD-10-CM | POA: Diagnosis present

## 2016-09-03 DIAGNOSIS — F17290 Nicotine dependence, other tobacco product, uncomplicated: Secondary | ICD-10-CM | POA: Diagnosis present

## 2016-09-03 DIAGNOSIS — R059 Cough, unspecified: Secondary | ICD-10-CM

## 2016-09-03 HISTORY — DX: Elevated white blood cell count, unspecified: D72.829

## 2016-09-03 LAB — URINALYSIS, ROUTINE W REFLEX MICROSCOPIC
Bilirubin Urine: NEGATIVE
Glucose, UA: NEGATIVE mg/dL
Hgb urine dipstick: NEGATIVE
Ketones, ur: NEGATIVE mg/dL
Nitrite: NEGATIVE
Protein, ur: NEGATIVE mg/dL
SPECIFIC GRAVITY, URINE: 1.009 (ref 1.005–1.030)
pH: 5 (ref 5.0–8.0)

## 2016-09-03 LAB — CBC WITH DIFFERENTIAL/PLATELET
Basophils Absolute: 0 10*3/uL (ref 0.0–0.1)
Basophils Relative: 0 %
EOS ABS: 1.3 10*3/uL — AB (ref 0.0–0.7)
EOS PCT: 8 %
HCT: 28 % — ABNORMAL LOW (ref 36.0–46.0)
Hemoglobin: 10.3 g/dL — ABNORMAL LOW (ref 12.0–15.0)
LYMPHS ABS: 5.9 10*3/uL — AB (ref 0.7–4.0)
Lymphocytes Relative: 39 %
MCH: 31 pg (ref 26.0–34.0)
MCHC: 36.8 g/dL — AB (ref 30.0–36.0)
MCV: 84.3 fL (ref 78.0–100.0)
MONOS PCT: 6 %
Monocytes Absolute: 1 10*3/uL (ref 0.1–1.0)
Neutro Abs: 7 10*3/uL (ref 1.7–7.7)
Neutrophils Relative %: 46 %
PLATELETS: 440 10*3/uL — AB (ref 150–400)
RBC: 3.32 MIL/uL — ABNORMAL LOW (ref 3.87–5.11)
RDW: 14.9 % (ref 11.5–15.5)
WBC: 15.1 10*3/uL — ABNORMAL HIGH (ref 4.0–10.5)

## 2016-09-03 LAB — COMPREHENSIVE METABOLIC PANEL
ALK PHOS: 41 U/L (ref 38–126)
ALT: 18 U/L (ref 14–54)
AST: 26 U/L (ref 15–41)
Albumin: 4.4 g/dL (ref 3.5–5.0)
Anion gap: 10 (ref 5–15)
BUN: 6 mg/dL (ref 6–20)
CALCIUM: 9.3 mg/dL (ref 8.9–10.3)
CHLORIDE: 101 mmol/L (ref 101–111)
CO2: 25 mmol/L (ref 22–32)
CREATININE: 0.85 mg/dL (ref 0.44–1.00)
GFR calc Af Amer: >60 mL/min (ref >60)
Glucose, Bld: 110 mg/dL — ABNORMAL HIGH (ref 65–99)
Potassium: 3.8 mmol/L (ref 3.5–5.1)
SODIUM: 136 mmol/L (ref 135–145)
Total Bilirubin: 1 mg/dL (ref 0.3–1.2)
Total Protein: 7.5 g/dL (ref 6.5–8.1)

## 2016-09-03 LAB — POC URINE PREG, ED: PREG TEST UR: NEGATIVE

## 2016-09-03 LAB — RETICULOCYTES
RBC.: 3.32 MIL/uL — ABNORMAL LOW (ref 3.87–5.11)
RETIC CT PCT: 4.4 % — AB (ref 0.4–3.1)
Retic Count, Absolute: 146.1 10*3/uL (ref 19.0–186.0)

## 2016-09-03 LAB — I-STAT CG4 LACTIC ACID, ED: Lactic Acid, Venous: 1.15 mmol/L (ref 0.5–1.9)

## 2016-09-03 MED ORDER — SENNOSIDES-DOCUSATE SODIUM 8.6-50 MG PO TABS
1.0000 | ORAL_TABLET | Freq: Two times a day (BID) | ORAL | Status: DC
Start: 2016-09-03 — End: 2016-09-04
  Administered 2016-09-04 (×2): 1 via ORAL
  Filled 2016-09-03 (×2): qty 1

## 2016-09-03 MED ORDER — HYDROMORPHONE HCL 1 MG/ML IJ SOLN: 2.0000 mg | INTRAMUSCULAR | Status: DC

## 2016-09-03 MED ORDER — ONDANSETRON HCL 4 MG/2ML IJ SOLN: 4.0000 mg | INTRAMUSCULAR | Status: DC | PRN

## 2016-09-03 MED ORDER — HYDROMORPHONE HCL 1 MG/ML IJ SOLN: 2.0000 mg | INTRAMUSCULAR | Status: AC

## 2016-09-03 MED ORDER — FENTANYL CITRATE (PF) 100 MCG/2ML IJ SOLN
100.0000 ug | Freq: Once | INTRAMUSCULAR | Status: AC
Start: 2016-09-03 — End: 2016-09-03
  Administered 2016-09-03: 100 ug via INTRAVENOUS
  Filled 2016-09-03: qty 2

## 2016-09-03 MED ORDER — FAMOTIDINE 20 MG PO TABS
20.0000 mg | ORAL_TABLET | Freq: Two times a day (BID) | ORAL | Status: DC
  Administered 2016-09-04: 20 mg via ORAL
  Filled 2016-09-03: qty 1

## 2016-09-03 MED ORDER — HYDROMORPHONE HCL 1 MG/ML IJ SOLN
2.0000 mg | INTRAMUSCULAR | Status: AC
  Administered 2016-09-03: 2 mg via INTRAVENOUS
  Filled 2016-09-03: qty 2

## 2016-09-03 MED ORDER — DEXTROSE-NACL 5-0.45 % IV SOLN
INTRAVENOUS | Status: AC
  Administered 2016-09-04: 01:00:00 via INTRAVENOUS

## 2016-09-03 MED ORDER — SODIUM CHLORIDE 0.9 % IV SOLN
25.0000 mg | INTRAVENOUS | Status: DC | PRN
  Filled 2016-09-03: qty 0.5

## 2016-09-03 MED ORDER — ONDANSETRON HCL 4 MG/2ML IJ SOLN: 4.0000 mg | Freq: Four times a day (QID) | INTRAMUSCULAR | Status: DC | PRN

## 2016-09-03 MED ORDER — NALOXONE HCL 0.4 MG/ML IJ SOLN: 0.4000 mg | INTRAMUSCULAR | Status: DC | PRN

## 2016-09-03 MED ORDER — HYDROMORPHONE HCL 1 MG/ML IJ SOLN
2.0000 mg | INTRAMUSCULAR | Status: DC
  Filled 2016-09-03: qty 2

## 2016-09-03 MED ORDER — DIPHENHYDRAMINE HCL 25 MG PO CAPS
25.0000 mg | ORAL_CAPSULE | ORAL | Status: DC | PRN
  Administered 2016-09-04: 25 mg via ORAL
  Filled 2016-09-03: qty 2

## 2016-09-03 MED ORDER — FENTANYL 40 MCG/ML IV SOLN
INTRAVENOUS | Status: DC
  Administered 2016-09-04: 50 ug via INTRAVENOUS
  Administered 2016-09-04: 1000 ug via INTRAVENOUS
  Administered 2016-09-04: 25 ug via INTRAVENOUS
  Administered 2016-09-04: 200 ug via INTRAVENOUS

## 2016-09-03 MED ORDER — DEXTROSE-NACL 5-0.45 % IV SOLN
INTRAVENOUS | Status: DC
  Administered 2016-09-03: 19:00:00 via INTRAVENOUS

## 2016-09-03 MED ORDER — DIPHENHYDRAMINE HCL 25 MG PO CAPS: 25.0000 mg | ORAL_CAPSULE | ORAL | Status: DC | PRN

## 2016-09-03 MED ORDER — ENOXAPARIN SODIUM 40 MG/0.4ML ~~LOC~~ SOLN: 40.0000 mg | Freq: Every day | SUBCUTANEOUS | Status: DC

## 2016-09-03 MED ORDER — POLYETHYLENE GLYCOL 3350 17 G PO PACK: 17.0000 g | PACK | Freq: Every day | ORAL | Status: DC | PRN

## 2016-09-03 MED ORDER — KETOROLAC TROMETHAMINE 30 MG/ML IJ SOLN
30.0000 mg | Freq: Four times a day (QID) | INTRAMUSCULAR | Status: DC
  Administered 2016-09-04 (×3): 30 mg via INTRAVENOUS
  Filled 2016-09-03 (×2): qty 1

## 2016-09-03 MED ORDER — FENTANYL CITRATE (PF) 100 MCG/2ML IJ SOLN
100.0000 ug | Freq: Once | INTRAMUSCULAR | Status: AC
  Administered 2016-09-03: 100 ug via INTRAVENOUS
  Filled 2016-09-03: qty 2

## 2016-09-03 MED ORDER — SODIUM CHLORIDE 0.9% FLUSH: 9.0000 mL | INTRAVENOUS | Status: DC | PRN

## 2016-09-03 MED ORDER — FAMOTIDINE IN NACL 20-0.9 MG/50ML-% IV SOLN
20.0000 mg | Freq: Two times a day (BID) | INTRAVENOUS | Status: DC
  Filled 2016-09-03 (×3): qty 50

## 2016-09-03 MED ORDER — MORPHINE SULFATE ER 15 MG PO TBCR
60.0000 mg | EXTENDED_RELEASE_TABLET | Freq: Two times a day (BID) | ORAL | Status: DC
  Administered 2016-09-04 (×2): 60 mg via ORAL
  Filled 2016-09-03 (×2): qty 4

## 2016-09-03 MED ORDER — ONDANSETRON HCL 4 MG PO TABS: 4.0000 mg | ORAL_TABLET | ORAL | Status: DC | PRN

## 2016-09-03 NOTE — ED Notes (Signed)
Upon entering pt room she states that this medication is not working for her and would like something different for pain. Medication returned and MD made aware.

## 2016-09-03 NOTE — H&P (Signed)
History and Physical    Lindsey [name] WJX:[id number] DOB: Oct 16, 1970 DOA: 09/03/2016  PCP: Billee Cashing, MD   Patient coming from: Home  Chief Complaint: Left leg pain  HPI: Lindsey [name] is a 46 y.o. female with medical history significant for sickle cell disease, now presenting to the emergency department with severe left leg pain. Patient reports that she was treated for cellulitis involving the left arm a little over a week ago, but that has now resolved and she had been in her usual state of health until this afternoon when she noted fairly acute onset of pain in the left leg. Unfortunately, she was unable to find any relief with her morphine IR at home, prompting her to come into the ED for evaluation. She describes her pain as severe, sharp, localized to the left leg, similar to her prior experiences with sickle cell pain crises, worse with movement, and with no alleviating factors identified. She denies any recent fall or trauma and denies any swelling or discoloration to the leg. She reports experiencing similar symptoms roughly once every 6 months, but reports that she is typically able to control the symptoms at home. She denies any chest pain or palpitations and denies any dyspnea. There has not been any abdominal pain, nausea, vomiting, diarrhea, dysuria, or flank pain.   ED Course: Upon arrival to the ED, patient is found to be afebrile, saturating well on room air, mildly tachycardic, and was stable blood pressure. Chemistry panel is unremarkable and CBC is notable for a chronic normocytic anemia with hemoglobin of 10.3 and a chronic leukocytosis with WBC 15,100. Lactic acid is reassuring at 1.15. Urinalysis is equivocal and sample will be sent for culture. Patient was given multiple doses of IV Dilaudid without any significant relief, then given a dose of 100 g fentanyl with some slight improvement in her pain. She was provided hydration with D5 half-normal saline with resolution of  her tachycardia. The patient has remained hemodynamically stable in the ED, is not in any apparent respiratory distress, and continues to deny any chest pain. She will be admitted to the medical/surgical unit at [facility] for ongoing evaluation and management of severe left leg pain suspected to reflect a sickle cell pain crisis.  Review of Systems:  All other systems reviewed and apart from HPI, are negative.  Past Medical History:  Diagnosis Date  . Sickle cell crisis [facility])     Past Surgical History:  Procedure Laterality Date  . APPENDECTOMY    . CESAREAN SECTION    . OTHER SURGICAL HISTORY     c-section     reports that she has been smoking Cigars.  She has been smoking about 0.00 packs per day. She has never used smokeless tobacco. She reports that she drinks alcohol. She reports that she does not use drugs.  No Known Allergies  History reviewed. No pertinent family history.   Prior to Admission medications   Medication Sig Start Date End Date Taking? Authorizing Provider  Aspirin-Acetaminophen-Caffeine (GOODY HEADACHE PO) Take 1 packet by mouth daily as needed (headache).    Yes Historical Provider, MD  morphine (MS CONTIN) 60 MG 12 hr tablet Take 1 tablet (60 mg total) by mouth 2 (two) times daily. 07/21/12  Yes Novlet Adelina Mings, MD  morphine (MSIR) 30 MG tablet Take 30-60 mg by mouth every 4 (four) hours as needed (pain). For pain    Yes Historical Provider, MD  PRESCRIPTION MEDICATION Apply 1 application topically daily as needed (  itching). Steroid cream for itching from bug bites   Yes Historical Provider, MD  sulfamethoxazole-trimethoprim (BACTRIM DS,SEPTRA DS) 800-160 MG tablet Take 1 tablet by mouth 2 (two) times daily. 7 day course filled 08/29/16 08/29/16  Yes Historical Provider, MD    Physical Exam: Vitals:   09/03/16 2045 09/03/16 2145 09/03/16 2215 09/03/16 2230  BP: 121/84 105/71 109/63 102/65  Pulse: 88 93 90 83  Resp: Temp:        TempSrc:      SpO2: 97% 97% 99% 97%  Weight:      Height:          Constitutional: NAD, calm, in obvious discomfort.  Eyes: PERTLA, lids and conjunctivae normal ENMT: Mucous membranes are moist. Posterior pharynx clear of any exudate or lesions.   Neck: normal, supple, no masses, no thyromegaly Respiratory: clear to auscultation bilaterally, no wheezing, no crackles. Normal respiratory effort.  Cardiovascular: S1 & S2 heard, regular rate and rhythm. No extremity edema. No significant JVD. Abdomen: No distension, soft, no tenderness, no masses palpated. Bowel sounds active.  Musculoskeletal: no clubbing / cyanosis. No joint deformity upper and lower extremities. LLE tender, with ROM severely limited by pain; no swelling, heat, or discoloration.  Skin: no significant rashes, lesions, ulcers. Warm, dry, well-perfused. Neurologic: CN 2-12 grossly intact. Sensation intact, DTR normal. Strength 5/5 in all 4 limbs.  Psychiatric: Alert and oriented x 3. Pleasant and cooperative.     Labs on Admission: I have personally reviewed following labs and imaging studies  CBC:  Recent Labs Lab 09/03/16 1910  WBC 15.1*  NEUTROABS 7.0  HGB 10.3*  HCT 28.0*  MCV 84.3  PLT 440*   Basic Metabolic Panel:  Recent Labs Lab 09/03/16 1910  NA 136  K 3.8  CL 101  CO2 25  GLUCOSE 110*  BUN 6  CREATININE 0.85  CALCIUM 9.3   GFR: Estimated Creatinine Clearance: 75.1 mL/min (by C-G formula based on SCr of 0.85 mg/dL). Liver Function Tests:  Recent Labs Lab 09/03/16 1910  AST 26  ALT 18  ALKPHOS 41  BILITOT 1.0  PROT 7.5  ALBUMIN 4.4   No results for input(s): LIPASE, AMYLASE in the last 168 hours. No results for input(s): AMMONIA in the last 168 hours. Coagulation Profile: No results for input(s): INR, PROTIME in the last 168 hours. Cardiac Enzymes: No results for input(s): CKTOTAL, CKMB, CKMBINDEX, TROPONINI in the last 168 hours. BNP (last 3 results) No results for  input(s): PROBNP in the last 8760 hours. HbA1C: No results for input(s): HGBA1C in the last 72 hours. CBG: No results for input(s): GLUCAP in the last 168 hours. Lipid Profile: No results for input(s): CHOL, HDL, LDLCALC, TRIG, CHOLHDL, LDLDIRECT in the last 72 hours. Thyroid Function Tests: No results for input(s): TSH, T4TOTAL, FREET4, T3FREE, THYROIDAB in the last 72 hours. Anemia Panel:  Recent Labs  09/03/16 1910  RETICCTPCT 4.4*   Urine analysis:    Component Value Date/Time   COLORURINE YELLOW 09/03/2016 1904   APPEARANCEUR HAZY (A) 09/03/2016 1904   LABSPEC 1.009 09/03/2016 1904   PHURINE 5.0 09/03/2016 1904   GLUCOSEU NEGATIVE 09/03/2016 1904   HGBUR NEGATIVE 09/03/2016 1904   BILIRUBINUR NEGATIVE 09/03/2016 1904   KETONESUR NEGATIVE 09/03/2016 1904   PROTEINUR NEGATIVE 09/03/2016 1904   UROBILINOGEN 1.0 12/25/2012 1755   NITRITE NEGATIVE 09/03/2016 1904   LEUKOCYTESUR SMALL (A) 09/03/2016 1904   Sepsis Labs: (procalcitonin:4,lacticidven:4) )No results found for this or any previous  visit (from the past 240 hour(s)).   Radiological Exams on Admission: No results found.  EKG: Ordered and pending.   Assessment/Plan  1. Sickle cell pain crisis  - Pt presents with severe left leg pain reminiscent of her prior pain crises  - She was unable to find any relief with her morphine IR at home - She denies any chest pain or SOB, denies fevers or chills  - CBC stable, reticulocytes appear appropriate  - She was treated with multiple doses of IV Dilaudid in ED with no appreciable improvement; reported some slight relief after a dose of IV fentanyl  - Plan to continue IVF hydration with D5-1/2 NS, continue her long-acting morphine q12h, start scheduled Toradol injections, and start a fentanyl PCA   2. Leukocytosis  - WBC is 15,100 on admission; appears to be chronically elevated  - She has just completed treatment for a LUE cellulitis which appears resolved   - UA is equivocal but she denies urinary sxs, sample sent for culture   - She endorses cough, but no SOB or chest pain, will check CXR  - Culture blood if febrile    DVT prophylaxis: sq Lovenox Code Status: Full  Family Communication: Husband updated at bedside Disposition Plan: Admit to med[facility]surg at [facility] Consults called: None Admission status: Inpatient    Briscoe Deutscher, MD [facility] Pager [phone]  If 7PM-7AM, please contact night-coverage [email or web address] Password [facility]  09/03/2016, 11:29 PM

## 2016-09-03 NOTE — ED Provider Notes (Signed)
[facility]-EMERGENCY DEPT Provider Note   CSN: [id number] Arrival date & time: 09/03/16  1849     History   Chief Complaint Chief Complaint  Patient presents with  . Sickle Cell Pain Crisis  . Leg Pain    HPI Lindsey [name] is a 46 y.o. female.  The history is provided by the patient.  Sickle Cell Pain Crisis  Location:  Lower extremity Severity:  Severe Onset quality:  Gradual Similar to previous crisis episodes: yes   Timing:  Constant Progression:  Worsening Chronicity:  Recurrent Date of last transfusion:  Never had transfusion History of pulmonary emboli: no   Context: not alcohol consumption, not change in medication, not cold exposure, not dehydration, not infection, not non-compliance, not low humidity and not stress   Relieved by:  Nothing Worsened by:  Activity and movement Ineffective treatments:  Prescription drugs Associated symptoms: no chest pain, no cough, no fever, no shortness of breath, no sore throat and no vomiting   Risk factors: no hx of stroke and no prior acute chest     Past Medical History:  Diagnosis Date  . Sickle cell crisis [facility])     Patient Active Problem List   Diagnosis Date Noted  . Leukocytosis 09/03/2016  . Sickle cell pain crisis (HCC) 07/20/2012  . Fever, unspecified 07/20/2012  . 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 APPENDECTOMY    . CESAREAN SECTION    . OTHER SURGICAL HISTORY     c-section    OB History    No data available       Home Medications    Prior to Admission medications   Medication Sig Start Date End Date Taking? Authorizing Provider  Aspirin-Acetaminophen-Caffeine (GOODY HEADACHE PO) Take 1 packet by mouth daily as needed (headache).    Yes Historical Provider, MD  morphine (MS CONTIN) 60 MG 12 hr tablet Take 1 tablet (60 mg total) by mouth 2 (two) times daily. 07/21/12  Yes Novlet Adelina Mings, MD  morphine (MSIR) 30 MG tablet Take 30-60 mg  by mouth every 4 (four) hours as needed (pain). For pain    Yes Historical Provider, MD  PRESCRIPTION MEDICATION Apply 1 application topically daily as needed (itching). Steroid cream for itching from bug bites   Yes Historical Provider, MD  sulfamethoxazole-trimethoprim (BACTRIM DS,SEPTRA DS) 800-160 MG tablet Take 1 tablet by mouth 2 (two) times daily. 7 day course filled 08/29/16 08/29/16  Yes Historical Provider, MD  benzonatate (TESSALON) 100 MG capsule Take 1 capsule (100 mg total) by mouth every 8 (eight) hours. Patient not taking: Reported on 09/03/2016 06/26/16   Deatra Canter, FNP  traMADol (ULTRAM) 50 MG tablet Take 1 tablet (50 mg total) by mouth once. Patient not taking: Reported on 03/25/2014 11/17/12   Francee Piccolo, PA-C    Family History History reviewed. No pertinent family history.  Social History Social History  Substance Use Topics  . Smoking status: Current Every Day Smoker    Packs/day: 0.00    Types: Cigars  . Smokeless tobacco: Never Used  . Alcohol use Yes     Allergies   Patient has no known allergies.   Review of Systems Review of Systems  Constitutional: Negative for chills and fever.  HENT: Negative for ear pain and sore throat.   Eyes: Negative for pain and visual disturbance.  Respiratory: Negative for cough and shortness of breath.   Cardiovascular: Negative for chest pain  and palpitations.  Gastrointestinal: Negative for abdominal pain and vomiting.  Genitourinary: Negative for dysuria and hematuria.  Musculoskeletal: Positive for gait problem and myalgias. Negative for arthralgias and back pain.  Skin: Negative for color change and rash.  Neurological: Negative for seizures and syncope.  All other systems reviewed and are negative.    Physical Exam Updated Vital Signs BP 102/65   Pulse 83   Temp 98.7 F (37.1 C) (Oral)   Resp 16   Ht 5' (1.524 m)   Wt 72.6 kg   LMP 08/24/2016   SpO2 97%   BMI 31.25 kg/m   Physical Exam    Constitutional: She appears well-developed and well-nourished. She appears distressed.  HENT:  Head: Normocephalic and atraumatic.  Eyes: Conjunctivae and EOM are normal.  Neck: Neck supple.  Cardiovascular: Normal rate, regular rhythm and intact distal pulses.   No murmur heard. Pulmonary/Chest: Effort normal and breath sounds normal. No respiratory distress.  Abdominal: Soft. There is no tenderness.  Musculoskeletal: She exhibits no edema.       Left hip: She exhibits normal range of motion, normal strength, no swelling and no crepitus.       Left upper leg: She exhibits tenderness. She exhibits no bony tenderness, no swelling, no edema and no deformity.       Left lower leg: She exhibits tenderness. She exhibits no bony tenderness, no swelling, no edema and no deformity.  Neurological: She is alert. No cranial nerve deficit or sensory deficit. GCS eye subscore is 4. GCS verbal subscore is 5. GCS motor subscore is 6.  Skin: Skin is warm and dry.  Psychiatric: She has a normal mood and affect.  Nursing note and vitals reviewed.    ED Treatments / Results  Labs (all labs ordered are listed, but only abnormal results are displayed) Labs Reviewed  COMPREHENSIVE METABOLIC PANEL - Abnormal; Notable for the following:       Result Value   Glucose, Bld 110 (*)    All other components within normal limits  CBC WITH DIFFERENTIAL/PLATELET - Abnormal; Notable for the following:    WBC 15.1 (*)    RBC 3.32 (*)    Hemoglobin 10.3 (*)    HCT 28.0 (*)    MCHC 36.8 (*)    Platelets 440 (*)    Lymphs Abs 5.9 (*)    Eosinophils Absolute 1.3 (*)    All other components within normal limits  RETICULOCYTES - Abnormal; Notable for the following:    Retic Ct Pct 4.4 (*)    RBC. 3.32 (*)    All other components within normal limits  URINALYSIS, ROUTINE W REFLEX MICROSCOPIC - Abnormal; Notable for the following:    APPearance HAZY (*)    Leukocytes, UA SMALL (*)    Bacteria, UA MANY (*)     Squamous Epithelial / LPF 6-30 (*)    All other components within normal limits  I-STAT CG4 LACTIC ACID, ED  POC URINE PREG, ED  I-STAT CG4 LACTIC ACID, ED    EKG  EKG Interpretation None       Radiology No results found.  Procedures Procedures (including critical care time)  Medications Ordered in ED Medications  HYDROmorphone (DILAUDID) injection 2 mg (2 mg Intravenous Refused 09/03/16 2058)    Or  HYDROmorphone (DILAUDID) injection 2 mg ( Subcutaneous See Alternative 09/03/16 2058)  HYDROmorphone (DILAUDID) injection 2 mg (2 mg Intravenous Refused 09/03/16 2153)    Or  HYDROmorphone (DILAUDID) injection 2 mg ( Subcutaneous  See Alternative 09/03/16 2153)  diphenhydrAMINE (BENADRYL) capsule 25-50 mg (not administered)  ondansetron (ZOFRAN) injection 4 mg (not administered)  dextrose 5 %-0.45 % sodium chloride infusion ( Intravenous New Bag/Given 09/03/16 1926)  HYDROmorphone (DILAUDID) injection 2 mg (2 mg Intravenous Given 09/03/16 1928)    Or  HYDROmorphone (DILAUDID) injection 2 mg ( Subcutaneous See Alternative 09/03/16 1928)  HYDROmorphone (DILAUDID) injection 2 mg (2 mg Intravenous Given 09/03/16 2006)    Or  HYDROmorphone (DILAUDID) injection 2 mg ( Subcutaneous See Alternative 09/03/16 2006)  fentaNYL (SUBLIMAZE) injection 100 mcg (100 mcg Intravenous Given 09/03/16 2116)  fentaNYL (SUBLIMAZE) injection 100 mcg (100 mcg Intravenous Given 09/03/16 2225)     Initial Impression / Assessment and Plan / ED Course  I have reviewed the triage vital signs and the nursing notes.  Pertinent labs & imaging results that were available during my care of the patient were reviewed by me and considered in my medical decision making (see chart for details).     46 year old female with history of sickle cell disease presents in the setting of extremity pain. Patient reports today she started having left leg pain consistent with previous vaso-occlusive crises. Patient without history of  transfusion, frequent pain crises, CVA, acute chest. Patient without chest pain, shortness of breath, fevers.  On examination patient was distressed and had pain and leg. Neurovascular intact on examination. Laboratory analysis without significant anemia or significantly decreased or increased reticulocytes. No other significant abdomen is noted. Presentation consistent with vaso-occlusive crisis. No signs of acute chest, infection, CVA or other significant abnormality. Patient given multiple doses of pain medication as well as IV fluids and anti-emetics. On reassessment patient continued to have pain and at this time will require admission to medicine for further management of pain crisis. Patient stable at time of transfer of care.  Final Clinical Impressions(s) / ED Diagnoses   Final diagnoses:  Sickle cell pain crisis [facility])    New Prescriptions New Prescriptions   No medications on file     Stacy Gardner, MD 09/03/16 2325    Gwyneth Sprout, MD 09/04/16 2302

## 2016-09-03 NOTE — ED Notes (Signed)
ED Provider at bedside. 

## 2016-09-03 NOTE — ED Triage Notes (Signed)
Pt c/o sickle cell crisis, c/o pain in Left leg. Pt crying in triage.

## 2016-09-04 DIAGNOSIS — D5700 Hb-SS disease with crisis, unspecified: Principal | ICD-10-CM

## 2016-09-04 LAB — HIV ANTIBODY (ROUTINE TESTING W REFLEX): HIV SCREEN 4TH GENERATION: NONREACTIVE

## 2016-09-04 MED ORDER — KETOROLAC TROMETHAMINE 30 MG/ML IJ SOLN
INTRAMUSCULAR | Status: AC
  Filled 2016-09-04: qty 1

## 2016-09-04 NOTE — Discharge Summary (Signed)
Physician Discharge Summary  Patient ID: Lindsey [name] MRN: [id number] DOB/AGE: [birth year] 45 y.o.  Admit date: 09/03/2016 Discharge date: 09/04/2016  Admission Diagnoses:  Discharge Diagnoses:  Principal Problem:   Sickle cell pain crisis [facility]) Active Problems:   Leukocytosis   Discharged Condition: good  Hospital Course: Patient was admitted overnight with sickle cell painful crisis. She was taking her medications at home but was not working. She subsequently had IVDilaudid that did not help. Patient was placed on fentanyl PCA and she responded to that very well. Her pain went from 8 out of 10 to 2 out of 10 so she was discharged home to continue home medications.  Consults: None  Significant Diagnostic Studies: labs: Serial CBCs and CMPs checked  Treatments: IV hydration and analgesia: Dilaudid  Discharge Exam: Blood pressure (!) 92/52, pulse 78, temperature 98.6 F (37 C), temperature source Oral, resp. rate 16, height 5' (1.524 m), weight 74.4 kg (164 lb), last menstrual period 08/24/2016, SpO2 98 %. General appearance: alert, cooperative and no distress Head: Normocephalic, without obvious abnormality, atraumatic Resp: clear to auscultation bilaterally Chest wall: no tenderness Cardio: regular rate and rhythm, S1, S2 normal, no murmur, click, rub or gallop GI: soft, non-tender; bowel sounds normal; no masses,  no organomegaly Extremities: extremities normal, atraumatic, no cyanosis or edema Pulses: 2+ and symmetric Neurologic: Grossly normal  Disposition: 01-Home or Self Care  Discharge Instructions    Diet - low sodium heart healthy    Complete by:  As directed    Increase activity slowly    Complete by:  As directed      Allergies as of 09/04/2016   No Known Allergies     Medication List    TAKE these medications   GOODY HEADACHE PO Take 1 packet by mouth daily as needed (headache).   morphine 30 MG tablet Commonly known as:  MSIR Take 30-60 mg by  mouth every 4 (four) hours as needed (pain). For pain   morphine 60 MG 12 hr tablet Commonly known as:  MS CONTIN Take 1 tablet (60 mg total) by mouth 2 (two) times daily.   PRESCRIPTION MEDICATION Apply 1 application topically daily as needed (itching). Steroid cream for itching from bug bites   sulfamethoxazole-trimethoprim 800-160 MG tablet Commonly known as:  BACTRIM DS,SEPTRA DS Take 1 tablet by mouth 2 (two) times daily. 7 day course filled 08/29/16        Signed: Lonia Blood 09/04/2016, 1:21 PM   Time spent 32 minutes

## 2016-09-04 NOTE — Progress Notes (Signed)
0045: Report received from [facility] from [facility], awaiting patients arrival via [facility].  0105: Patient arrives to 3W, situated comfortably in bed, reviewing orders.

## 2016-09-05 LAB — URINE CULTURE

## 2017-03-11 ENCOUNTER — Emergency Department (HOSPITAL_COMMUNITY): Payer: Self-pay

## 2017-03-11 ENCOUNTER — Encounter (HOSPITAL_COMMUNITY): Payer: Self-pay

## 2017-03-11 ENCOUNTER — Emergency Department (HOSPITAL_COMMUNITY)
Admission: EM | Admit: 2017-03-11 | Discharge: 2017-03-11 | Disposition: A | Discharge status: Home / Self Care | Payer: Self-pay | Attending: Emergency Medicine | Admitting: Emergency Medicine

## 2017-03-11 DIAGNOSIS — D5700 Hb-SS disease with crisis, unspecified: Secondary | ICD-10-CM | POA: Insufficient documentation

## 2017-03-11 DIAGNOSIS — F17290 Nicotine dependence, other tobacco product, uncomplicated: Secondary | ICD-10-CM | POA: Insufficient documentation

## 2017-03-11 LAB — COMPREHENSIVE METABOLIC PANEL
ALBUMIN: 4.3 g/dL (ref 3.5–5.0)
ALK PHOS: 45 U/L (ref 38–126)
ALT: 19 U/L (ref 14–54)
ANION GAP: 8 (ref 5–15)
AST: 24 U/L (ref 15–41)
BILIRUBIN TOTAL: 1.3 mg/dL — AB (ref 0.3–1.2)
BUN: 8 mg/dL (ref 6–20)
CALCIUM: 8.9 mg/dL (ref 8.9–10.3)
CO2: 26 mmol/L (ref 22–32)
Chloride: 106 mmol/L (ref 101–111)
Creatinine, Ser: 0.7 mg/dL (ref 0.44–1.00)
Glucose, Bld: 110 mg/dL — ABNORMAL HIGH (ref 65–99)
POTASSIUM: 4 mmol/L (ref 3.5–5.1)
Sodium: 140 mmol/L (ref 135–145)
TOTAL PROTEIN: 7.6 g/dL (ref 6.5–8.1)

## 2017-03-11 LAB — CBC WITH DIFFERENTIAL/PLATELET
BASOS ABS: 0 10*3/uL (ref 0.0–0.1)
BASOS PCT: 0 %
Eosinophils Absolute: 1.1 10*3/uL — ABNORMAL HIGH (ref 0.0–0.7)
Eosinophils Relative: 7 %
HEMATOCRIT: 26.1 % — AB (ref 36.0–46.0)
HEMOGLOBIN: 9.8 g/dL — AB (ref 12.0–15.0)
LYMPHS PCT: 25 %
Lymphs Abs: 3.7 10*3/uL (ref 0.7–4.0)
MCH: 32.7 pg (ref 26.0–34.0)
MCHC: 37.5 g/dL — AB (ref 30.0–36.0)
MCV: 87 fL (ref 78.0–100.0)
Monocytes Absolute: 1.2 10*3/uL — ABNORMAL HIGH (ref 0.1–1.0)
Monocytes Relative: 8 %
NEUTROS ABS: 8.7 10*3/uL — AB (ref 1.7–7.7)
NEUTROS PCT: 59 %
PLATELETS: 451 10*3/uL — AB (ref 150–400)
RBC: 3 MIL/uL — AB (ref 3.87–5.11)
RDW: 15.3 % (ref 11.5–15.5)
WBC: 14.7 10*3/uL — AB (ref 4.0–10.5)

## 2017-03-11 LAB — RETICULOCYTES
RBC.: 3 MIL/uL — AB (ref 3.87–5.11)
RETIC CT PCT: 6.2 % — AB (ref 0.4–3.1)
Retic Count, Absolute: 186 10*3/uL (ref 19.0–186.0)

## 2017-03-11 LAB — I-STAT TROPONIN, ED: TROPONIN I, POC: 0 ng/mL (ref 0.00–0.08)

## 2017-03-11 MED ORDER — HYDROMORPHONE HCL 2 MG/ML IJ SOLN
2.0000 mg | INTRAMUSCULAR | Status: AC
Start: 2017-03-11 — End: 2017-03-11
  Administered 2017-03-11: 2 mg via INTRAVENOUS
  Filled 2017-03-11: qty 1

## 2017-03-11 MED ORDER — ONDANSETRON HCL 4 MG/2ML IJ SOLN: 4.0000 mg | INTRAMUSCULAR | Status: DC | PRN

## 2017-03-11 MED ORDER — HYDROMORPHONE HCL 2 MG/ML IJ SOLN
2.0000 mg | INTRAMUSCULAR | Status: AC
Start: 2017-03-11 — End: 2017-03-11

## 2017-03-11 MED ORDER — HYDROMORPHONE HCL 2 MG/ML IJ SOLN: 2.0000 mg | INTRAMUSCULAR | Status: DC

## 2017-03-11 MED ORDER — HYDROMORPHONE HCL 2 MG/ML IJ SOLN
2.0000 mg | INTRAMUSCULAR | Status: DC
  Administered 2017-03-11: 2 mg via INTRAVENOUS
  Filled 2017-03-11: qty 1

## 2017-03-11 MED ORDER — DIPHENHYDRAMINE HCL 50 MG/ML IJ SOLN
25.0000 mg | Freq: Once | INTRAMUSCULAR | Status: AC
  Administered 2017-03-11: 25 mg via INTRAVENOUS
  Filled 2017-03-11: qty 1

## 2017-03-11 MED ORDER — HYDROMORPHONE HCL 2 MG/ML IJ SOLN
2.0000 mg | INTRAMUSCULAR | Status: AC
  Administered 2017-03-11: 2 mg via INTRAVENOUS
  Filled 2017-03-11: qty 1

## 2017-03-11 MED ORDER — HYDROMORPHONE HCL 2 MG/ML IJ SOLN: 2.0000 mg | INTRAMUSCULAR | Status: AC

## 2017-03-11 NOTE — Discharge Instructions (Signed)
Please return for new or worsening symptoms.

## 2017-03-11 NOTE — ED Provider Notes (Signed)
[facility]-EMERGENCY DEPT Provider Note   CSN: [id number] Arrival date & time: 03/11/17  0002     History   Chief Complaint Chief Complaint  Patient presents with  . Sickle Cell Pain Crisis    HPI Lindsey [name] is a 46 y.o. female.  Patient presents to the emergency department with a chief complaint of left arm pain.  She states that this pain feels the same as when she has sickle cell pain crisis.  She reports that the symptoms started approximately 4 hours ago.  She has tried taking her regular home medications with no relief.  She states that the pain is in her left shoulder and radiates down her left arm.  She denies any chest pain or shortness of breath.  She denies any fevers, chills, nausea, or vomiting.  She denies having any pain elsewhere.  She states that she normally gets the pain in her lower extremities.  She denies any injuries to the area.  Denies any rash.  Denies any swollen joints.   The history is provided by the patient. No language interpreter was used.    Past Medical History:  Diagnosis Date  . Sickle cell crisis [facility])     Patient Active Problem List   Diagnosis Date Noted  . Leukocytosis 09/03/2016  . Sickle cell pain crisis (HCC) 07/20/2012  . Fever, unspecified 07/20/2012  . 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 APPENDECTOMY    . CESAREAN SECTION    . OTHER SURGICAL HISTORY     c-section    OB History    No data available       Home Medications    Prior to Admission medications   Medication Sig Start Date End Date Taking? Authorizing Provider  Aspirin-Acetaminophen-Caffeine (GOODY HEADACHE PO) Take 1 packet by mouth daily as needed (headache).     [provider]  morphine (MS CONTIN) 60 MG 12 hr tablet Take 1 tablet (60 mg total) by mouth 2 (two) times daily. 07/21/12   Alinda Money, MD  morphine (MSIR) 30 MG tablet Take 30-60 mg by  mouth every 4 (four) hours as needed (pain). For pain     [provider]  PRESCRIPTION MEDICATION Apply 1 application topically daily as needed (itching). Steroid cream for itching from bug bites    [provider]  sulfamethoxazole-trimethoprim (BACTRIM DS,SEPTRA DS) 800-160 MG tablet Take 1 tablet by mouth 2 (two) times daily. 7 day course filled 08/29/16 08/29/16   [provider]    Family History History reviewed. No pertinent family history.  Social History Social History  Substance Use Topics  . Smoking status: Current Every Day Smoker    Packs/day: 0.00    Types: Cigars  . Smokeless tobacco: Never Used  . Alcohol use Yes     Allergies   Patient has no known allergies.   Review of Systems Review of Systems  All other systems reviewed and are negative.    Physical Exam Updated Vital Signs BP 121/80 (BP Location: Left Arm)   Pulse 97   Temp 99.1 F (37.3 C) (Oral)   Resp 20   Ht 5' (1.524 m)   SpO2 100%   Physical Exam  Constitutional: She is oriented to person, place, and time. She appears well-developed and well-nourished.  HENT:  Head: Normocephalic and atraumatic.  Eyes: Pupils are equal, round, and reactive to light. Conjunctivae and EOM are  normal.  Neck: Normal range of motion. Neck supple.  Cardiovascular: Normal rate and regular rhythm.  Exam reveals no gallop and no friction rub.   No murmur heard. Pulmonary/Chest: Effort normal and breath sounds normal. No respiratory distress. She has no wheezes. She has no rales. She exhibits no tenderness.  Abdominal: Soft. Bowel sounds are normal. She exhibits no distension and no mass. There is no tenderness. There is no rebound and no guarding.  Musculoskeletal: Normal range of motion. She exhibits no edema or tenderness.  Range of motion of the LUE is 5/5 Strength 5/5  Neurological: She is alert and oriented to person, place, and time.  Skin: Skin is warm and dry.  No rash or  erythema  Psychiatric: She has a normal mood and affect. Her behavior is normal. Judgment and thought content normal.  Nursing note and vitals reviewed.    ED Treatments / Results  Labs (all labs ordered are listed, but only abnormal results are displayed) Labs Reviewed  RETICULOCYTES  COMPREHENSIVE METABOLIC PANEL  CBC WITH DIFFERENTIAL/PLATELET    EKG  EKG Interpretation None       Radiology No results found.  Procedures Procedures (including critical care time)  Medications Ordered in ED Medications  HYDROmorphone (DILAUDID) injection 2 mg (not administered)    Or  HYDROmorphone (DILAUDID) injection 2 mg (not administered)  HYDROmorphone (DILAUDID) injection 2 mg (not administered)    Or  HYDROmorphone (DILAUDID) injection 2 mg (not administered)  HYDROmorphone (DILAUDID) injection 2 mg (not administered)    Or  HYDROmorphone (DILAUDID) injection 2 mg (not administered)  HYDROmorphone (DILAUDID) injection 2 mg (not administered)    Or  HYDROmorphone (DILAUDID) injection 2 mg (not administered)  diphenhydrAMINE (BENADRYL) injection 25 mg (not administered)  ondansetron (ZOFRAN) injection 4 mg (not administered)     Initial Impression / Assessment and Plan / ED Course  I have reviewed the triage vital signs and the nursing notes.  Pertinent labs & imaging results that were available during my care of the patient were reviewed by me and considered in my medical decision making (see chart for details).     Patient with left shoulder pain.  Presumably sickle cell pain.  Will treat pain, check labs, will reassess.  Given the radiating pain from the left shoulder to the left arm, will also check chest x-ray, troponin, and EKG.  Will assess.  After 3rd dose, patient states that her pain is improving.  She would like to go home.  She doesn't want to be admitted, although this was offered.  Will give one additional dose of pain meds and discharge to home.  Final  Clinical Impressions(s) / ED Diagnoses   Final diagnoses:  Sickle cell anemia with pain [facility](HCC)    New Prescriptions New Prescriptions   No medications on file     Roxy HorsemanBrowning, Emony Dormer, Cordelia Poche-C 03/11/17 0408    Charlynne PanderYao, David Hsienta, MD 03/11/17 [phone]

## 2017-03-11 NOTE — ED Triage Notes (Signed)
Reports sickle cell crisis, left arm pain. Started about 4 hours ago.

## 2017-09-03 ENCOUNTER — Emergency Department (HOSPITAL_COMMUNITY): Payer: 59

## 2017-09-03 ENCOUNTER — Other Ambulatory Visit: Payer: Self-pay

## 2017-09-03 ENCOUNTER — Emergency Department (HOSPITAL_COMMUNITY)
Admission: EM | Admit: 2017-09-03 | Discharge: 2017-09-04 | Disposition: A | Discharge status: Home / Self Care | Payer: 59 | Attending: Emergency Medicine | Admitting: Emergency Medicine

## 2017-09-03 ENCOUNTER — Encounter (HOSPITAL_COMMUNITY): Payer: Self-pay | Admitting: *Deleted

## 2017-09-03 ENCOUNTER — Ambulatory Visit (HOSPITAL_COMMUNITY)
Admission: EM | Admit: 2017-09-03 | Discharge: 2017-09-03 | Disposition: A | Discharge status: Home / Self Care | Payer: 59 | Source: Home / Self Care

## 2017-09-03 DIAGNOSIS — M25511 Pain in right shoulder: Secondary | ICD-10-CM | POA: Diagnosis not present

## 2017-09-03 DIAGNOSIS — D5700 Hb-SS disease with crisis, unspecified: Secondary | ICD-10-CM

## 2017-09-03 DIAGNOSIS — F1121 Opioid dependence, in remission: Secondary | ICD-10-CM

## 2017-09-03 DIAGNOSIS — Z862 Personal history of diseases of the blood and blood-forming organs and certain disorders involving the immune mechanism: Secondary | ICD-10-CM | POA: Diagnosis not present

## 2017-09-03 DIAGNOSIS — F17210 Nicotine dependence, cigarettes, uncomplicated: Secondary | ICD-10-CM | POA: Insufficient documentation

## 2017-09-03 DIAGNOSIS — D57219 Sickle-cell/Hb-C disease with crisis, unspecified: Secondary | ICD-10-CM | POA: Diagnosis present

## 2017-09-03 DIAGNOSIS — Z1389 Encounter for screening for other disorder: Secondary | ICD-10-CM | POA: Diagnosis not present

## 2017-09-03 HISTORY — DX: Opioid dependence, in remission: F11.21

## 2017-09-03 LAB — COMPREHENSIVE METABOLIC PANEL
ALT: 14 U/L (ref 14–54)
AST: 17 U/L (ref 15–41)
Albumin: 3.6 g/dL (ref 3.5–5.0)
Alkaline Phosphatase: 44 U/L (ref 38–126)
Anion gap: 8 (ref 5–15)
BILIRUBIN TOTAL: 1 mg/dL (ref 0.3–1.2)
BUN: 10 mg/dL (ref 6–20)
CO2: 23 mmol/L (ref 22–32)
Calcium: 8.9 mg/dL (ref 8.9–10.3)
Chloride: 110 mmol/L (ref 101–111)
Creatinine, Ser: 0.86 mg/dL (ref 0.44–1.00)
GFR calc Af Amer: >60 mL/min (ref >60)
GFR calc non Af Amer: >60 mL/min (ref >60)
Glucose, Bld: 103 mg/dL — ABNORMAL HIGH (ref 65–99)
POTASSIUM: 3.9 mmol/L (ref 3.5–5.1)
Sodium: 141 mmol/L (ref 135–145)
TOTAL PROTEIN: 6.3 g/dL — AB (ref 6.5–8.1)

## 2017-09-03 LAB — I-STAT CG4 LACTIC ACID, ED: Lactic Acid, Venous: 1.28 mmol/L (ref 0.5–1.9)

## 2017-09-03 LAB — CBC WITH DIFFERENTIAL/PLATELET
BASOS ABS: 0 10*3/uL (ref 0.0–0.1)
BASOS PCT: 0 %
EOS PCT: 2 %
Eosinophils Absolute: 0.2 10*3/uL (ref 0.0–0.7)
HCT: 28.1 % — ABNORMAL LOW (ref 36.0–46.0)
Hemoglobin: 10.2 g/dL — ABNORMAL LOW (ref 12.0–15.0)
LYMPHS PCT: 46 %
Lymphs Abs: 5.4 10*3/uL — ABNORMAL HIGH (ref 0.7–4.0)
MCH: 32.4 pg (ref 26.0–34.0)
MCHC: 36.3 g/dL — ABNORMAL HIGH (ref 30.0–36.0)
MCV: 89.2 fL (ref 78.0–100.0)
Monocytes Absolute: 0.7 10*3/uL (ref 0.1–1.0)
Monocytes Relative: 6 %
Neutro Abs: 5.4 10*3/uL (ref 1.7–7.7)
Neutrophils Relative %: 46 %
PLATELETS: 525 10*3/uL — AB (ref 150–400)
RBC: 3.15 MIL/uL — AB (ref 3.87–5.11)
RDW: 16.4 % — ABNORMAL HIGH (ref 11.5–15.5)
WBC: 11.8 10*3/uL — AB (ref 4.0–10.5)

## 2017-09-03 LAB — RETICULOCYTES
RBC.: 3.15 MIL/uL — AB (ref 3.87–5.11)
RETIC COUNT ABSOLUTE: 204.8 10*3/uL — AB (ref 19.0–186.0)
Retic Ct Pct: 6.5 % — ABNORMAL HIGH (ref 0.4–3.1)

## 2017-09-03 LAB — I-STAT BETA HCG BLOOD, ED (MC, WL, AP ONLY)

## 2017-09-03 MED ORDER — KETOROLAC TROMETHAMINE 60 MG/2ML IM SOLN
60.0000 mg | Freq: Once | INTRAMUSCULAR | Status: AC
  Administered 2017-09-03: 60 mg via INTRAMUSCULAR

## 2017-09-03 MED ORDER — KETOROLAC TROMETHAMINE 60 MG/2ML IM SOLN
INTRAMUSCULAR | Status: AC
  Filled 2017-09-03: qty 2

## 2017-09-03 MED ORDER — SODIUM CHLORIDE 0.9 % IV BOLUS
1000.0000 mL | Freq: Once | INTRAVENOUS | Status: AC
  Administered 2017-09-03: 1000 mL via INTRAVENOUS

## 2017-09-03 MED ORDER — KETOROLAC TROMETHAMINE 30 MG/ML IJ SOLN
30.0000 mg | Freq: Once | INTRAMUSCULAR | Status: AC
  Administered 2017-09-03: 30 mg via INTRAVENOUS
  Filled 2017-09-03: qty 1

## 2017-09-03 NOTE — Discharge Instructions (Signed)
You were seen in the ED today with right shoulder pain. This is most likely from a sickle cell crisis. Your labs and x-rays were largely unremarkable. Call to establish care with Dr. Hyman Hopes with the Sickle Cell clinic. Return to the ED with any chest pain, fever, or difficulty breathing.

## 2017-09-03 NOTE — ED Provider Notes (Signed)
Emergency Department Provider Note   I have reviewed the triage vital signs and the nursing notes.   HISTORY  Chief Complaint Sickle Cell Pain Crisis and Shoulder Injury   HPI Lindsey [name] is a 47 y.o. female with PMH of SSC and opioid dependence currently in treatment resents to the emergency department for evaluation of right shoulder pain.  Patient states that her right shoulder has been slightly swollen and more painful over the past 2 days.  She denies injury.  No neck pain, numbness, tingling.  No radiation of symptoms down the arm, to the chest, or to the back.  She denies any fevers or chills.   Past Medical History:  Diagnosis Date  . Opioid dependence in remission (HCC)   . Sickle cell crisis [facility])     Patient Active Problem List   Diagnosis Date Noted  . Leukocytosis 09/03/2016  . Sickle cell pain crisis (HCC) 07/20/2012  . Fever, unspecified 07/20/2012  . 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 APPENDECTOMY    . CESAREAN SECTION    . OTHER SURGICAL HISTORY     c-section    Current Outpatient Rx  . Order #: [id number] Class: Historical Med  . Order #: [id number] Class: Historical Med  . Order #: [id number] Class: Historical Med  . Order #: [id number] Class: Historical Med    Allergies Patient has no known allergies.  No family history on file.  Social History Social History   Tobacco Use  . Smoking status: Current Some Day Smoker    Packs/day: 0.00    Types: Cigars  . Smokeless tobacco: Never Used  Substance Use Topics  . Alcohol use: Yes    Comment: occasionally  . Drug use: No    Review of Systems  Constitutional: No fever/chills Eyes: No visual changes. ENT: No sore throat. Cardiovascular: Denies chest pain. Respiratory: Denies shortness of breath. Gastrointestinal: No abdominal pain.  No nausea, no vomiting.  No diarrhea.  No constipation. Genitourinary: Negative for  dysuria. Musculoskeletal: Negative for back pain. Positive right shoulder pain.  Skin: Negative for rash. Neurological: Negative for headaches, focal weakness or numbness.  10-point ROS otherwise negative.  ____________________________________________   PHYSICAL EXAM:  VITAL SIGNS: ED Triage Vitals  Enc Vitals Group     BP 09/03/17 1943 134/80     Pulse Rate 09/03/17 1943 63     Resp 09/03/17 1943 16     Temp 09/03/17 1943 98.8 F (37.1 C)     Temp Source 09/03/17 1943 Oral     SpO2 09/03/17 1943 100 %     Pain Score 09/03/17 1949 3   Constitutional: Alert and oriented. Well appearing and in no acute distress. Eyes: Conjunctivae are normal. Head: Atraumatic. Nose: No congestion/rhinnorhea. Mouth/Throat: Mucous membranes are moist. Neck: No stridor.  Cardiovascular: Normal rate, regular rhythm. Good peripheral circulation. Grossly normal heart sounds.   Respiratory: Normal respiratory effort.  No retractions. Lungs CTAB. Gastrointestinal: Soft and nontender. No distention.  Musculoskeletal: No lower extremity tenderness nor edema. No gross deformities of extremities. Mild/moderate pain with passive ROM of the right shoulder. No erythema or warmth over the joint.  Neurologic:  Normal speech and language. No gross focal neurologic deficits are appreciated.  Skin:  Skin is warm, dry and intact. No rash noted.  ____________________________________________   LABS (all labs ordered are listed, but only abnormal results are displayed)  Labs Reviewed  COMPREHENSIVE METABOLIC PANEL - Abnormal; Notable  for the following components:      Result Value   Glucose, Bld 103 (*)    Total Protein 6.3 (*)    All other components within normal limits  CBC WITH DIFFERENTIAL/PLATELET - Abnormal; Notable for the following components:   WBC 11.8 (*)    RBC 3.15 (*)    Hemoglobin 10.2 (*)    HCT 28.1 (*)    MCHC 36.3 (*)    RDW 16.4 (*)    Platelets 525 (*)    Lymphs Abs 5.4 (*)    All  other components within normal limits  RETICULOCYTES - Abnormal; Notable for the following components:   Retic Ct Pct 6.5 (*)    RBC. 3.15 (*)    Retic Count, Absolute 204.8 (*)    All other components within normal limits  I-STAT BETA HCG BLOOD, ED ([facility], [facility], [facility] ONLY)  I-STAT CG4 LACTIC ACID, ED   ____________________________________________  RADIOLOGY  Dg Chest 2 View  Result Date: 09/03/2017 CLINICAL DATA:  Right shoulder pain for 2 days. No known injury. History of sickle cell. EXAM: CHEST - 2 VIEW COMPARISON:  03/11/2017 FINDINGS: Heart size and pulmonary vascularity are normal. Linear fibrosis or atelectasis in the left lung base. No airspace disease or consolidation the lungs. No blunting of costophrenic angles. No pneumothorax. Mediastinal contours appear intact. Biconcave appearance of multiple thoracic vertebra and sclerosis in the humeral heads consistent with changes of sickle cell. IMPRESSION: Linear fibrosis or atelectasis in the left lung base. No evidence of active pulmonary disease. Bone changes of sickle cell. Electronically Signed   By: Burman Nieves M.D.   On: 09/03/2017 22:24   Dg Shoulder Right  Result Date: 09/03/2017 CLINICAL DATA:  Right shoulder pain for 2 days. No known injury. History of sickle cell. EXAM: RIGHT SHOULDER - 2+ VIEW COMPARISON:  None. FINDINGS: No evidence of acute fracture or dislocation of the right shoulder. Sclerosis in the superior humeral head likely representing bone infarct due to sickle cell. A similar appearance was present on previous chest radiograph from 03/11/2017. No expansile or destructive bone lesions. Soft tissues are unremarkable. IMPRESSION: 1. No acute fracture or dislocation of the right shoulder. 2. Sclerosis in the humeral head consistent with bone changes due to sickle cell. Electronically Signed   By: Burman Nieves M.D.   On: 09/03/2017 22:25    ____________________________________________   PROCEDURES  Procedure(s)  performed:   Procedures  None ____________________________________________   INITIAL IMPRESSION / ASSESSMENT AND PLAN / ED COURSE  Pertinent labs & imaging results that were available during my care of the patient were reviewed by me and considered in my medical decision making (see chart for details).  [place] to the emergency department for evaluation of right shoulder pain.  No evidence on exam or by history to suspect septic arthritis.  Patient states that she has been off of opiates for 60 days after undergoing treatment and would prefer not to receive any opiate medications today.  She agrees to additional Toradol and IV fluids while obtaining plain films of the right shoulder and chest.  Labs and imaging reviewed. No acute abnormalities. Patient reports feeling much better after Toradol and 1L IVF bolus. Will discharge after IVF.  ____________________________________________  FINAL CLINICAL IMPRESSION(S) / ED DIAGNOSES  Final diagnoses:  Sickle cell pain crisis (HCC)  Acute pain of right shoulder     MEDICATIONS GIVEN DURING THIS VISIT:  Medications  ketorolac (TORADOL) 30 MG/ML injection 30 mg (30 mg Intravenous Given  09/03/17 2242)  sodium chloride 0.9 % bolus 1,000 mL (1,000 mLs Intravenous New Bag/Given 09/03/17 2242)    Note:  This document was prepared using Dragon voice recognition software and may include unintentional dictation errors.  Alona Bene, MD Emergency Medicine     Long, Arlyss Repress, MD 09/04/17 0001

## 2017-09-03 NOTE — Discharge Instructions (Signed)
Please presents to the ED

## 2017-09-03 NOTE — ED Triage Notes (Signed)
Pt c/o R shoulder pain x 2 days. Denies known injury. Hx of sickle cell; reports unsure if pain is related to sickle cell crisis

## 2017-09-03 NOTE — ED Triage Notes (Addendum)
Reports waking up yesterday morning with significant right shoulder pain.  Denies any known injury.  Pt tearful.  Has hx sickle cell crisis; states this feels different.

## 2017-09-03 NOTE — ED Provider Notes (Signed)
09/03/2017 6:13 PM   DOB: [birth year] / MRN: [id number]  SUBJECTIVE:  Lindsey [name] is a 47 y.o. female with a PMH of sickle cell disease and sickle cell crisis presenting for atraumatic right shoulder pain that started 2 days ago.  She is complicated by a history of opioid dependence and is 60 days clean. The pain in the shoulder is made worse with movement.   She has No Known Allergies.   She  has a past medical history of Opioid dependence in remission (HCC) and Sickle cell crisis (HCC).    She  reports that she has been smoking cigars.  She has been smoking about 0.00 packs per day. She has never used smokeless tobacco. She reports that she drinks alcohol. She reports that she does not use drugs. She  has no sexual activity history on file. The patient  has a past surgical history that includes Appendectomy; Other surgical history; and Cesarean section.  Her family history is not on file.  Review of Systems  Constitutional: Negative for chills, diaphoresis, fever, malaise/fatigue and weight loss.  Cardiovascular: Negative for chest pain and palpitations.  Genitourinary: Negative for dysuria, flank pain, frequency, hematuria and urgency.  Musculoskeletal: Positive for joint pain. Negative for back pain, falls, myalgias and neck pain.  Skin: Negative for itching and rash.  Neurological: Negative for dizziness.    OBJECTIVE:  BP 125/79   Pulse 70   Temp 98.8 F (37.1 C) (Oral)   Resp 16   LMP 08/27/2017 (Approximate)   SpO2 99%   Physical Exam  Constitutional: She is oriented to person, place, and time. She appears well-nourished. No distress.  Tearful with pain.  Eyes: Pupils are equal, round, and reactive to light. EOM are normal.  Cardiovascular: Normal rate.  Pulmonary/Chest: Effort normal.  Abdominal: She exhibits no distension.  Musculoskeletal: She exhibits tenderness (exquisitely tender at the right [facility] joint).  Right sided UE ROM limited to 45 degrees in abduction.   Internal and external rotation within normal limits.   Neurological: She is alert and oriented to person, place, and time. No cranial nerve deficit. Gait normal.  Skin: Skin is dry. She is not diaphoretic.  Psychiatric: She has a normal mood and affect.  Vitals reviewed.  Lab Results  Component Value Date   CREATININE 0.70 03/11/2017   BUN 8 03/11/2017   NA 140 03/11/2017   K 4.0 03/11/2017   CL 106 03/11/2017   CO2 26 03/11/2017     No results found for this or any previous visit (from the past 72 hour(s)).  No results found.  ASSESSMENT AND PLAN:  Orders Placed This Encounter  Procedures  . Reticulocytes    Standing Status:   Standing    Number of Occurrences:   1  . ED EKG    Standing Status:   Standing    Number of Occurrences:   1    Order Specific Question:   Reason for Exam    Answer:   Chest Pain  . EKG 12-Lead    Standing Status:   Standing    Number of Occurrences:   1  . EKG 12-Lead    Standing Status:   Standing    Number of Occurrences:   1  . 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  I have spoken to  Dr. Hyman Hopes who is recommending the patient present to the ED for the sickle cell protocol.  EKG here was normal sinus rhythm.  I have given the patient a shot of Toradol.      The patient is advised to call or return to clinic if she does not see an improvement in symptoms, or to seek the care of the closest emergency department if she worsens with the above plan.   Deliah Boston, MHS, PA-C 09/03/2017 6:13 PM    Ofilia Neas, PA-C 09/03/17 1815

## 2017-09-04 ENCOUNTER — Emergency Department (HOSPITAL_COMMUNITY)
Admission: EM | Admit: 2017-09-04 | Discharge: 2017-09-04 | Discharge status: Against Medical Advice | Payer: MEDICAID | Source: Home / Self Care

## 2017-09-04 ENCOUNTER — Encounter (HOSPITAL_COMMUNITY): Payer: Self-pay | Admitting: Emergency Medicine

## 2017-09-04 ENCOUNTER — Emergency Department (HOSPITAL_COMMUNITY)
Admission: EM | Admit: 2017-09-04 | Discharge: 2017-09-05 | Disposition: A | Discharge status: Home / Self Care | Payer: 59 | Source: Home / Self Care | Attending: Emergency Medicine | Admitting: Emergency Medicine

## 2017-09-04 ENCOUNTER — Other Ambulatory Visit: Payer: Self-pay

## 2017-09-04 DIAGNOSIS — D5700 Hb-SS disease with crisis, unspecified: Secondary | ICD-10-CM

## 2017-09-04 DIAGNOSIS — F17290 Nicotine dependence, other tobacco product, uncomplicated: Secondary | ICD-10-CM

## 2017-09-04 DIAGNOSIS — Z79899 Other long term (current) drug therapy: Secondary | ICD-10-CM | POA: Insufficient documentation

## 2017-09-04 DIAGNOSIS — D57219 Sickle-cell/Hb-C disease with crisis, unspecified: Secondary | ICD-10-CM

## 2017-09-04 LAB — CBC WITH DIFFERENTIAL/PLATELET
BASOS ABS: 0 10*3/uL (ref 0.0–0.1)
BASOS PCT: 0 %
EOS PCT: 2 %
Eosinophils Absolute: 0.3 10*3/uL (ref 0.0–0.7)
HEMATOCRIT: 28.4 % — AB (ref 36.0–46.0)
HEMOGLOBIN: 10.4 g/dL — AB (ref 12.0–15.0)
Lymphocytes Relative: 35 %
Lymphs Abs: 4.8 10*3/uL — ABNORMAL HIGH (ref 0.7–4.0)
MCH: 32.3 pg (ref 26.0–34.0)
MCHC: 36.6 g/dL — AB (ref 30.0–36.0)
MCV: 88.2 fL (ref 78.0–100.0)
MONOS PCT: 4 %
Monocytes Absolute: 0.6 10*3/uL (ref 0.1–1.0)
NEUTROS ABS: 8.1 10*3/uL — AB (ref 1.7–7.7)
Neutrophils Relative %: 59 %
Platelets: 511 10*3/uL — ABNORMAL HIGH (ref 150–400)
RBC: 3.22 MIL/uL — ABNORMAL LOW (ref 3.87–5.11)
RDW: 15.8 % — ABNORMAL HIGH (ref 11.5–15.5)
WBC: 13.8 10*3/uL — ABNORMAL HIGH (ref 4.0–10.5)

## 2017-09-04 LAB — RETICULOCYTES
RBC.: 3.22 MIL/uL — AB (ref 3.87–5.11)
Retic Count, Absolute: 206.1 10*3/uL — ABNORMAL HIGH (ref 19.0–186.0)
Retic Ct Pct: 6.4 % — ABNORMAL HIGH (ref 0.4–3.1)

## 2017-09-04 LAB — COMPREHENSIVE METABOLIC PANEL
ALBUMIN: 4.1 g/dL (ref 3.5–5.0)
ALT: 15 U/L (ref 14–54)
AST: 18 U/L (ref 15–41)
Alkaline Phosphatase: 40 U/L (ref 38–126)
Anion gap: 5 (ref 5–15)
BUN: 8 mg/dL (ref 6–20)
CHLORIDE: 112 mmol/L — AB (ref 101–111)
CO2: 23 mmol/L (ref 22–32)
Calcium: 9.2 mg/dL (ref 8.9–10.3)
Creatinine, Ser: 0.82 mg/dL (ref 0.44–1.00)
GFR calc Af Amer: >60 mL/min (ref >60)
GFR calc non Af Amer: >60 mL/min (ref >60)
GLUCOSE: 98 mg/dL (ref 65–99)
POTASSIUM: 4.2 mmol/L (ref 3.5–5.1)
SODIUM: 140 mmol/L (ref 135–145)
Total Bilirubin: 1 mg/dL (ref 0.3–1.2)
Total Protein: 6.9 g/dL (ref 6.5–8.1)

## 2017-09-04 LAB — I-STAT BETA HCG BLOOD, ED (MC, WL, AP ONLY): I-stat hCG, quantitative: <5 m[IU]/mL (ref <5)

## 2017-09-04 MED ORDER — HYDROMORPHONE HCL 2 MG/ML IJ SOLN
2.0000 mg | Freq: Once | INTRAMUSCULAR | Status: AC
  Administered 2017-09-04: 2 mg via INTRAVENOUS
  Filled 2017-09-04: qty 1

## 2017-09-04 MED ORDER — ONDANSETRON HCL 4 MG/2ML IJ SOLN: 4.0000 mg | INTRAMUSCULAR | Status: DC | PRN

## 2017-09-04 MED ORDER — HYDROMORPHONE HCL 2 MG/ML IJ SOLN: 1.0000 mg | INTRAMUSCULAR | Status: AC

## 2017-09-04 MED ORDER — KETOROLAC TROMETHAMINE 30 MG/ML IJ SOLN
30.0000 mg | Freq: Once | INTRAMUSCULAR | Status: AC
  Administered 2017-09-04: 30 mg via INTRAVENOUS
  Filled 2017-09-04: qty 1

## 2017-09-04 MED ORDER — DIPHENHYDRAMINE HCL 25 MG PO CAPS: 25.0000 mg | ORAL_CAPSULE | ORAL | Status: DC | PRN

## 2017-09-04 MED ORDER — HYDROMORPHONE HCL 2 MG/ML IJ SOLN
0.5000 mg | INTRAMUSCULAR | Status: AC
  Filled 2017-09-04: qty 1

## 2017-09-04 MED ORDER — SODIUM CHLORIDE 0.45 % IV SOLN
INTRAVENOUS | Status: DC
  Administered 2017-09-05: via INTRAVENOUS

## 2017-09-04 MED ORDER — HYDROMORPHONE HCL 2 MG/ML IJ SOLN
1.0000 mg | INTRAMUSCULAR | Status: AC
  Administered 2017-09-05: 1 mg via INTRAVENOUS
  Filled 2017-09-04: qty 1

## 2017-09-04 MED ORDER — HYDROMORPHONE HCL 2 MG/ML IJ SOLN
0.5000 mg | INTRAMUSCULAR | Status: AC
  Administered 2017-09-05: 0.5 mg via INTRAVENOUS
  Filled 2017-09-04: qty 1

## 2017-09-04 NOTE — ED Triage Notes (Signed)
Patient to ED c/o sickle cell crisis - pain in L arm and R shoulder, here last night for same. Patient trying to detox from opioids and is requesting Toradol for pain. Patient denies SOB or CP. Tearful in triage.

## 2017-09-05 ENCOUNTER — Other Ambulatory Visit: Payer: Self-pay

## 2017-09-05 MED ORDER — IBUPROFEN 800 MG PO TABS
800.0000 mg | ORAL_TABLET | Freq: Three times a day (TID) | ORAL | 0 refills | Status: DC
Start: 2017-09-05 — End: 2017-09-27

## 2017-09-05 NOTE — ED Provider Notes (Signed)
[facility] EMERGENCY DEPARTMENT Provider Note   CSN: [id number] Arrival date & time: 09/04/17  2119     History   Chief Complaint Chief Complaint  Patient presents with  . Sickle Cell Pain Crisis    HPI Lindsey [name] is a 47 y.o. female.  Patient presents to the emergency department with chief complaint of sickle cell pain.  She states that she was seen here yesterday for the same and was treated with Toradol.  She has been reluctant to use opioid medication because she has been clean for approximately 60 days.  She states that she has pain in her right shoulder which improved somewhat yesterday, but the pain worsened today and is now in her left arm as well.  She denies any chest pain or shortness of breath.  Denies any fever, chills, cough.  Denies any other associated symptoms.  She no longer has primary care provider.    The history is provided by the patient. No language interpreter was used.    Past Medical History:  Diagnosis Date  . Opioid dependence in remission (HCC)   . Sickle cell crisis [facility])     Patient Active Problem List   Diagnosis Date Noted  . Leukocytosis 09/03/2016  . Sickle cell pain crisis (HCC) 07/20/2012  . Fever, unspecified 07/20/2012  . 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 APPENDECTOMY    . CESAREAN SECTION    . OTHER SURGICAL HISTORY     c-section     OB History   None      Home Medications    Prior to Admission medications   Medication Sig Start Date End Date Taking? Authorizing Provider  acetaminophen (TYLENOL 8 HOUR) 650 MG CR tablet Take 1,300 mg by mouth every 8 (eight) hours as needed for pain.    [provider]  amoxicillin (AMOXIL) 500 MG capsule Take 500 mg by mouth 3 (three) times daily.    [provider]  Aspirin-Acetaminophen-Caffeine (GOODY HEADACHE PO) Take 1 packet by mouth daily as needed (headache).      [provider]  ibuprofen (ADVIL,MOTRIN) 200 MG tablet Take 200 mg by mouth every 6 (six) hours as needed for moderate pain.    [provider]    Family History No family history on file.  Social History Social History   Tobacco Use  . Smoking status: Current Some Day Smoker    Packs/day: 0.00    Types: Cigars  . Smokeless tobacco: Never Used  Substance Use Topics  . Alcohol use: Yes    Comment: occasionally  . Drug use: No     Allergies   Patient has no known allergies.   Review of Systems Review of Systems  All other systems reviewed and are negative.    Physical Exam Updated Vital Signs BP 124/74   Pulse (!) 59   Temp 98.9 F (37.2 C) (Oral)   Resp 18   Ht 5' (1.524 m)   Wt 72.6 kg (160 lb)   LMP 08/27/2017 (Approximate)   SpO2 98%   BMI 31.25 kg/m   Physical Exam  Constitutional: She is oriented to person, place, and time. She appears well-developed and well-nourished.  HENT:  Head: Normocephalic and atraumatic.  Eyes: Pupils are equal, round, and reactive to light. Conjunctivae and EOM are normal.  Neck: Normal range of motion. Neck supple.  Cardiovascular: Normal rate and regular rhythm. Exam reveals  no gallop and no friction rub.  No murmur heard. Pulmonary/Chest: Effort normal and breath sounds normal. No respiratory distress. She has no wheezes. She has no rales. She exhibits no tenderness.  Abdominal: Soft. Bowel sounds are normal. She exhibits no distension and no mass. There is no tenderness. There is no rebound and no guarding.  Musculoskeletal: Normal range of motion. She exhibits no edema or tenderness.  Neurological: She is alert and oriented to person, place, and time.  Skin: Skin is warm and dry.  Psychiatric: She has a normal mood and affect. Her behavior is normal. Judgment and thought content normal.  Nursing note and vitals reviewed.    ED Treatments / Results  Labs (all labs ordered are listed, but only  abnormal results are displayed) Labs Reviewed  COMPREHENSIVE METABOLIC PANEL - Abnormal; Notable for the following components:      Result Value   Chloride 112 (*)    All other components within normal limits  CBC WITH DIFFERENTIAL/PLATELET - Abnormal; Notable for the following components:   WBC 13.8 (*)    RBC 3.22 (*)    Hemoglobin 10.4 (*)    HCT 28.4 (*)    MCHC 36.6 (*)    RDW 15.8 (*)    Platelets 511 (*)    Neutro Abs 8.1 (*)    Lymphs Abs 4.8 (*)    All other components within normal limits  RETICULOCYTES - Abnormal; Notable for the following components:   Retic Ct Pct 6.4 (*)    RBC. 3.22 (*)    Retic Count, Absolute 206.1 (*)    All other components within normal limits  I-STAT BETA HCG BLOOD, ED ([facility], [facility], [facility] ONLY)    EKG None  Radiology Dg Chest 2 View  Result Date: 09/03/2017 CLINICAL DATA:  Right shoulder pain for 2 days. No known injury. History of sickle cell. EXAM: CHEST - 2 VIEW COMPARISON:  03/11/2017 FINDINGS: Heart size and pulmonary vascularity are normal. Linear fibrosis or atelectasis in the left lung base. No airspace disease or consolidation the lungs. No blunting of costophrenic angles. No pneumothorax. Mediastinal contours appear intact. Biconcave appearance of multiple thoracic vertebra and sclerosis in the humeral heads consistent with changes of sickle cell. IMPRESSION: Linear fibrosis or atelectasis in the left lung base. No evidence of active pulmonary disease. Bone changes of sickle cell. Electronically Signed   By: Burman Nieves M.D.   On: 09/03/2017 22:24   Dg Shoulder Right  Result Date: 09/03/2017 CLINICAL DATA:  Right shoulder pain for 2 days. No known injury. History of sickle cell. EXAM: RIGHT SHOULDER - 2+ VIEW COMPARISON:  None. FINDINGS: No evidence of acute fracture or dislocation of the right shoulder. Sclerosis in the superior humeral head likely representing bone infarct due to sickle cell. A similar appearance was present on  previous chest radiograph from 03/11/2017. No expansile or destructive bone lesions. Soft tissues are unremarkable. IMPRESSION: 1. No acute fracture or dislocation of the right shoulder. 2. Sclerosis in the humeral head consistent with bone changes due to sickle cell. Electronically Signed   By: Burman Nieves M.D.   On: 09/03/2017 22:25    Procedures Procedures (including critical care time)  Medications Ordered in ED Medications  0.45 % sodium chloride infusion ( Intravenous New Bag/Given 09/05/17 0008)  HYDROmorphone (DILAUDID) injection 0.5 mg (has no administration in time range)    Or  HYDROmorphone (DILAUDID) injection 0.5 mg (has no administration in time range)  HYDROmorphone (DILAUDID) injection 1 mg (  has no administration in time range)    Or  HYDROmorphone (DILAUDID) injection 1 mg (has no administration in time range)  ondansetron (ZOFRAN) injection 4 mg (has no administration in time range)  diphenhydrAMINE (BENADRYL) capsule 25-50 mg (has no administration in time range)  ketorolac (TORADOL) 30 MG/ML injection 30 mg (30 mg Intravenous Given 09/04/17 2236)  HYDROmorphone (DILAUDID) injection 2 mg (2 mg Intravenous Given 09/04/17 2318)     Initial Impression / Assessment and Plan / ED Course  I have reviewed the triage vital signs and the nursing notes.  Pertinent labs & imaging results that were available during my care of the patient were reviewed by me and considered in my medical decision making (see chart for details).     Patient with sickle cell pain.  Laboratory work-up is reassuring.  Patient is afebrile.  Will treat pain, and will reassess.  2:43 AM Patient's pain has resolved.  She would like to be discharged.  Will discharge with strict return precautions.  Final Clinical Impressions(s) / ED Diagnoses   Final diagnoses:  Sickle cell anemia with pain [facility])    ED Discharge Orders        Ordered    ibuprofen (ADVIL,MOTRIN) 800 MG tablet  3 times daily       09/05/17 0242       Roxy Horseman, PA-C 09/05/17 0244    Ward, Layla Maw, DO 09/05/17 [phone]

## 2017-09-05 NOTE — Discharge Instructions (Signed)
Please take medication as directed.  Return to the ER for new or worsening symptoms.  Drink plenty of fluid.

## 2017-09-05 NOTE — ED Notes (Signed)
Pt verbalizes understanding of d/c instructions. Pt received prescriptions. Pt ambulatory at d/c with all belongings.  

## 2017-09-25 ENCOUNTER — Encounter (HOSPITAL_COMMUNITY): Payer: Self-pay

## 2017-09-25 ENCOUNTER — Other Ambulatory Visit: Payer: Self-pay

## 2017-09-25 ENCOUNTER — Inpatient Hospital Stay (HOSPITAL_COMMUNITY)
Admission: EM | Admit: 2017-09-25 | Discharge: 2017-09-27 | DRG: 812 | Disposition: A | Discharge status: Home / Self Care | Payer: 59 | Attending: Internal Medicine | Admitting: Internal Medicine

## 2017-09-25 DIAGNOSIS — Z79899 Other long term (current) drug therapy: Secondary | ICD-10-CM

## 2017-09-25 DIAGNOSIS — F17290 Nicotine dependence, other tobacco product, uncomplicated: Secondary | ICD-10-CM | POA: Diagnosis present

## 2017-09-25 DIAGNOSIS — Q8901 Asplenia (congenital): Secondary | ICD-10-CM

## 2017-09-25 DIAGNOSIS — R7989 Other specified abnormal findings of blood chemistry: Secondary | ICD-10-CM | POA: Diagnosis present

## 2017-09-25 DIAGNOSIS — D5700 Hb-SS disease with crisis, unspecified: Secondary | ICD-10-CM | POA: Diagnosis not present

## 2017-09-25 DIAGNOSIS — F1190 Opioid use, unspecified, uncomplicated: Secondary | ICD-10-CM

## 2017-09-25 DIAGNOSIS — D72829 Elevated white blood cell count, unspecified: Secondary | ICD-10-CM | POA: Diagnosis present

## 2017-09-25 DIAGNOSIS — Z791 Long term (current) use of non-steroidal anti-inflammatories (NSAID): Secondary | ICD-10-CM | POA: Diagnosis not present

## 2017-09-25 DIAGNOSIS — J9811 Atelectasis: Secondary | ICD-10-CM | POA: Diagnosis present

## 2017-09-25 DIAGNOSIS — F1121 Opioid dependence, in remission: Secondary | ICD-10-CM | POA: Diagnosis present

## 2017-09-25 DIAGNOSIS — F1110 Opioid abuse, uncomplicated: Secondary | ICD-10-CM

## 2017-09-25 HISTORY — DX: Opioid abuse, uncomplicated: F11.10

## 2017-09-25 LAB — CBC WITH DIFFERENTIAL/PLATELET
BASOS ABS: 0 10*3/uL (ref 0.0–0.1)
Basophils Relative: 0 %
EOS ABS: 0.4 10*3/uL (ref 0.0–0.7)
Eosinophils Relative: 4 %
HCT: 29.1 % — ABNORMAL LOW (ref 36.0–46.0)
HEMOGLOBIN: 10.6 g/dL — AB (ref 12.0–15.0)
LYMPHS PCT: 38 %
Lymphs Abs: 3.6 10*3/uL (ref 0.7–4.0)
MCH: 32.3 pg (ref 26.0–34.0)
MCHC: 36.4 g/dL — ABNORMAL HIGH (ref 30.0–36.0)
MCV: 88.7 fL (ref 78.0–100.0)
MONOS PCT: 6 %
Monocytes Absolute: 0.6 10*3/uL (ref 0.1–1.0)
NEUTROS ABS: 4.8 10*3/uL (ref 1.7–7.7)
NEUTROS PCT: 52 %
PLATELETS: 546 10*3/uL — AB (ref 150–400)
RBC: 3.28 MIL/uL — AB (ref 3.87–5.11)
RDW: 14.9 % (ref 11.5–15.5)
WBC: 9.4 10*3/uL (ref 4.0–10.5)

## 2017-09-25 LAB — COMPREHENSIVE METABOLIC PANEL
ALBUMIN: 4.1 g/dL (ref 3.5–5.0)
ALK PHOS: 37 U/L — AB (ref 38–126)
ALT: 14 U/L (ref 14–54)
ANION GAP: 9 (ref 5–15)
AST: 20 U/L (ref 15–41)
BUN: 12 mg/dL (ref 6–20)
CALCIUM: 9 mg/dL (ref 8.9–10.3)
CO2: 23 mmol/L (ref 22–32)
Chloride: 112 mmol/L — ABNORMAL HIGH (ref 101–111)
Creatinine, Ser: 0.9 mg/dL (ref 0.44–1.00)
Glucose, Bld: 93 mg/dL (ref 65–99)
POTASSIUM: 4.6 mmol/L (ref 3.5–5.1)
Sodium: 144 mmol/L (ref 135–145)
Total Bilirubin: 1.4 mg/dL — ABNORMAL HIGH (ref 0.3–1.2)
Total Protein: 7.2 g/dL (ref 6.5–8.1)

## 2017-09-25 LAB — I-STAT BETA HCG BLOOD, ED (MC, WL, AP ONLY)

## 2017-09-25 LAB — RETICULOCYTES
RBC.: 3.28 MIL/uL — ABNORMAL LOW (ref 3.87–5.11)
RETIC CT PCT: 5.1 % — AB (ref 0.4–3.1)
Retic Count, Absolute: 167.3 10*3/uL (ref 19.0–186.0)

## 2017-09-25 LAB — LACTATE DEHYDROGENASE: LDH: 160 U/L (ref 98–192)

## 2017-09-25 MED ORDER — SODIUM CHLORIDE 0.9 % IV SOLN
INTRAVENOUS | Status: DC
  Administered 2017-09-25 – 2017-09-27 (×2): via INTRAVENOUS

## 2017-09-25 MED ORDER — HYDROMORPHONE HCL 1 MG/ML IJ SOLN: 1.0000 mg | INTRAMUSCULAR | Status: DC

## 2017-09-25 MED ORDER — ONDANSETRON HCL 4 MG PO TABS: 4.0000 mg | ORAL_TABLET | Freq: Four times a day (QID) | ORAL | Status: DC | PRN

## 2017-09-25 MED ORDER — HYDROMORPHONE HCL 1 MG/ML IJ SOLN
1.0000 mg | INTRAMUSCULAR | Status: AC
  Administered 2017-09-25: 1 mg via INTRAVENOUS
  Filled 2017-09-25: qty 1

## 2017-09-25 MED ORDER — HYDROMORPHONE HCL 1 MG/ML IJ SOLN
1.0000 mg | Freq: Once | INTRAMUSCULAR | Status: AC
  Administered 2017-09-25: 1 mg via INTRAVENOUS
  Filled 2017-09-25: qty 1

## 2017-09-25 MED ORDER — HYDROMORPHONE HCL 1 MG/ML IJ SOLN: 1.0000 mg | INTRAMUSCULAR | Status: AC

## 2017-09-25 MED ORDER — SODIUM CHLORIDE 0.45 % IV BOLUS
1000.0000 mL | Freq: Once | INTRAVENOUS | Status: AC
  Administered 2017-09-25: 1000 mL via INTRAVENOUS

## 2017-09-25 MED ORDER — ACETAMINOPHEN 500 MG PO TABS
1000.0000 mg | ORAL_TABLET | Freq: Once | ORAL | Status: AC
  Administered 2017-09-25: 1000 mg via ORAL
  Filled 2017-09-25: qty 2

## 2017-09-25 MED ORDER — MAGNESIUM CITRATE PO SOLN
1.0000 | Freq: Once | ORAL | Status: DC | PRN
Start: 2017-09-25 — End: 2017-09-27

## 2017-09-25 MED ORDER — HYDROMORPHONE HCL 2 MG/ML IJ SOLN
1.5000 mg | INTRAMUSCULAR | Status: DC | PRN
  Administered 2017-09-25 – 2017-09-27 (×17): 1.5 mg via INTRAVENOUS
  Filled 2017-09-25 (×17): qty 1

## 2017-09-25 MED ORDER — ENOXAPARIN SODIUM 40 MG/0.4ML ~~LOC~~ SOLN
40.0000 mg | SUBCUTANEOUS | Status: DC
  Administered 2017-09-25: 40 mg via SUBCUTANEOUS
  Filled 2017-09-25: qty 0.4

## 2017-09-25 MED ORDER — ONDANSETRON HCL 4 MG/2ML IJ SOLN
4.0000 mg | Freq: Four times a day (QID) | INTRAMUSCULAR | Status: DC | PRN
  Administered 2017-09-25: 4 mg via INTRAVENOUS
  Filled 2017-09-25 (×2): qty 2

## 2017-09-25 MED ORDER — SENNA 8.6 MG PO TABS
1.0000 | ORAL_TABLET | Freq: Two times a day (BID) | ORAL | Status: DC
  Administered 2017-09-25 – 2017-09-27 (×4): 8.6 mg via ORAL
  Filled 2017-09-25 (×5): qty 1

## 2017-09-25 MED ORDER — HYDROMORPHONE HCL 2 MG/ML IJ SOLN: 1.5000 mg | INTRAMUSCULAR | Status: DC | PRN

## 2017-09-25 MED ORDER — HYDROMORPHONE HCL 1 MG/ML IJ SOLN
0.5000 mg | Freq: Once | INTRAMUSCULAR | Status: AC
  Administered 2017-09-25: 0.5 mg via INTRAVENOUS
  Filled 2017-09-25: qty 1

## 2017-09-25 MED ORDER — KETOROLAC TROMETHAMINE 30 MG/ML IJ SOLN
30.0000 mg | INTRAMUSCULAR | Status: AC
  Administered 2017-09-25: 30 mg via INTRAVENOUS
  Filled 2017-09-25: qty 1

## 2017-09-25 MED ORDER — KETOROLAC TROMETHAMINE 30 MG/ML IJ SOLN
30.0000 mg | Freq: Four times a day (QID) | INTRAMUSCULAR | Status: DC
  Administered 2017-09-25 – 2017-09-27 (×8): 30 mg via INTRAVENOUS
  Filled 2017-09-25 (×8): qty 1

## 2017-09-25 MED ORDER — ACETAMINOPHEN 500 MG PO TABS
1000.0000 mg | ORAL_TABLET | Freq: Three times a day (TID) | ORAL | Status: DC
  Administered 2017-09-25 – 2017-09-27 (×6): 1000 mg via ORAL
  Filled 2017-09-25 (×6): qty 2

## 2017-09-25 MED ORDER — DOCUSATE SODIUM 100 MG PO CAPS
100.0000 mg | ORAL_CAPSULE | Freq: Two times a day (BID) | ORAL | Status: DC
  Administered 2017-09-25 – 2017-09-27 (×4): 100 mg via ORAL
  Filled 2017-09-25 (×5): qty 1

## 2017-09-25 NOTE — ED Provider Notes (Signed)
[facility]-EMERGENCY DEPT Provider Note   CSN: [id number] Arrival date & time: 09/25/17  0751     History   Chief Complaint Chief Complaint  Patient presents with  . Sickle Cell Pain Crisis    HPI Lindsey [name] is a 47 y.o. female.  HPI   Lindsey [name] is a 47 y.o. female, with a history of sickle cell, presenting to the ED with left arm pain beginning around 2 AM this morning.  Pain is sharp, 10/10, nonradiating, consistent with her sickle cell pain.  Took ibuprofen around 6:30 AM this morning.  Has been opioid free from home opioid use for the last 60 days. States she does not want opioids at home, however, her pain this morning is severe enough that she would accept opioids here in the ED.  Denies fever/chills, N/V/D, chest pain, abdominal pain, shortness of breath, cough, numbness, weakness, injuries, or any other complaints.    Past Medical History:  Diagnosis Date  . 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 Opioid dependence in remission (HCC)   . Sickle cell crisis [facility])     Patient Active Problem List   Diagnosis Date Noted  . 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 Leukocytosis 09/03/2016  . Sickle cell pain crisis (HCC) 07/20/2012  . 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 APPENDECTOMY    . CESAREAN SECTION    . OTHER SURGICAL HISTORY     c-section     OB History   None      Home Medications    Prior to Admission medications   Medication Sig Start Date End Date Taking? Authorizing Provider  acetaminophen (TYLENOL 8 HOUR) 650 MG CR tablet Take 1,300 mg by mouth every 8 (eight) hours as needed for pain.   Yes [provider]  Aspirin-Acetaminophen-Caffeine (GOODY HEADACHE PO) Take 1 packet by mouth daily as needed (headache).    Yes [provider]  ibuprofen (ADVIL,MOTRIN) 800 MG tablet Take 1 tablet (800 mg total) by mouth 3 (three) times  daily. 09/05/17  Yes Roxy Horseman, PA-C    Family History History reviewed. No pertinent family history.  Social History Social History   Tobacco Use  . Smoking status: Current Some Day Smoker    Packs/day: 0.00    Types: Cigars  . Smokeless tobacco: Never Used  Substance Use Topics  . Alcohol use: Yes    Comment: occasionally  . Drug use: No     Allergies   Patient has no known allergies.   Review of Systems Review of Systems  Constitutional: Negative for chills and fever.  Respiratory: Negative for shortness of breath.   Cardiovascular: Negative for chest pain.  Gastrointestinal: Negative for abdominal pain, diarrhea, nausea and vomiting.  Musculoskeletal: Positive for myalgias.  Neurological: Negative for weakness and numbness.  All other systems reviewed and are negative.    Physical Exam Updated Vital Signs BP (!) 132/98 (BP Location: Right Arm)   Pulse 88   Temp 98.7 F (37.1 C) (Oral)   Resp 20   Ht 5' (1.524 m)   Wt 72.6 kg (160 lb)   LMP 09/25/2017   SpO2 100%   BMI 31.25 kg/m   Physical Exam  Constitutional: She appears well-developed and well-nourished. She appears distressed (pain, tearful).  HENT:  Head: Normocephalic and atraumatic.  Eyes: Conjunctivae are normal.  Neck: Neck supple.  Cardiovascular: Normal rate, regular rhythm, normal heart  sounds and intact distal pulses.  Pulses:      Radial pulses are 2+ on the right side, and 2+ on the left side.  Patient's hands are appropriately warm to the touch and tactile temperature equal bilaterally.  Pulmonary/Chest: Effort normal and breath sounds normal. No respiratory distress.  Abdominal: Soft. There is no tenderness. There is no guarding.  Musculoskeletal: She exhibits no edema, tenderness or deformity.  No tenderness, erythema, bruising, swelling, or deformity noted to left upper extremity.  Full range of motion through the cardinal directions of the left shoulder, elbow, and wrist  without pain.  Lymphadenopathy:    She has no cervical adenopathy.  Neurological: She is alert.  No sensory deficits noted in the bilateral upper extremities. Abduction and adduction of the fingers intact against resistance. Grip strength equal bilaterally. Supination and pronation intact against resistance. Strength 5/5 through the cardinal directions of the bilateral wrists. Strength 5/5 with flexion and extension of the bilateral elbows. Patient can touch the thumb to each one of the fingertips without difficulty.   Skin: Skin is warm and dry. Capillary refill takes less than 2 seconds. She is not diaphoretic.  Psychiatric: She has a normal mood and affect. Her behavior is normal.  Nursing note and vitals reviewed.    ED Treatments / Results  Labs (all labs ordered are listed, but only abnormal results are displayed) Labs Reviewed  COMPREHENSIVE METABOLIC PANEL - Abnormal; Notable for the following components:      Result Value   Chloride 112 (*)    Alkaline Phosphatase 37 (*)    Total Bilirubin 1.4 (*)    All other components within normal limits  CBC WITH DIFFERENTIAL/PLATELET - Abnormal; Notable for the following components:   RBC 3.28 (*)    Hemoglobin 10.6 (*)    HCT 29.1 (*)    MCHC 36.4 (*)    Platelets 546 (*)    All other components within normal limits  RETICULOCYTES - Abnormal; Notable for the following components:   Retic Ct Pct 5.1 (*)    RBC. 3.28 (*)    All other components within normal limits  LACTATE DEHYDROGENASE  I-STAT BETA HCG BLOOD, ED ([facility], [facility], [facility] ONLY)    EKG None  Radiology No results found.  Procedures Procedures (including critical care time)  Medications Ordered in ED Medications  HYDROmorphone (DILAUDID) injection 1.5 mg (1.5 mg Intravenous Given 09/25/17 1249)  ketorolac (TORADOL) 30 MG/ML injection 30 mg (30 mg Intravenous Given 09/25/17 0856)  sodium chloride 0.45 % bolus 1,000 mL (0 mLs Intravenous Stopped 09/25/17 0947)    acetaminophen (TYLENOL) tablet 1,000 mg (1,000 mg Oral Given 09/25/17 0856)  HYDROmorphone (DILAUDID) injection 0.5 mg (0.5 mg Intravenous Given 09/25/17 0857)  HYDROmorphone (DILAUDID) injection 1 mg (1 mg Intravenous Given 09/25/17 0946)  HYDROmorphone (DILAUDID) injection 1 mg (1 mg Intravenous Given 09/25/17 1110)    Or  HYDROmorphone (DILAUDID) injection 1 mg ( Subcutaneous See Alternative 09/25/17 1110)     Initial Impression / Assessment and Plan / ED Course  I have reviewed the triage vital signs and the nursing notes.  Pertinent labs & imaging results that were available during my care of the patient were reviewed by me and considered in my medical decision making (see chart for details).  Clinical Course as of Sep 26 1255  Mon Sep 25, 2017  1610 Patient reevaluated. Voices no improvement in pain.    [SJ]  1012 Patient's pain has improved to 5/10.   [SJ]  1155 Pain has improved to 4/10.   [SJ]  1214 Spoke with Dr. Clearnce Sorrel, hospitalist. Agrees to admit the patient.    [SJ]    Clinical Course User Index [SJ] Shadawn Hanaway C, PA-C    Patient presents with left arm pain, consistent with her sickle cell pain.  Patient afebrile, nontoxic-appearing, not tachycardic.  No leukocytosis.  No evidence of injury on exam.  We will admit patient for continued management due to persistent pain despite multiple doses of narcotic analgesics.    Final Clinical Impressions(s) / ED Diagnoses   Final diagnoses:  Sickle cell pain crisis [facility]    ED Discharge Orders    None       Concepcion Living 09/25/17 1259    Linwood Dibbles, MD 09/27/17 1851

## 2017-09-25 NOTE — ED Notes (Signed)
Report given to floor RN patient taken to floor

## 2017-09-25 NOTE — ED Triage Notes (Signed)
Patient c/o sickle cell pain in the left arm. Patient denies any SOB or chest pain.

## 2017-09-25 NOTE — H&P (Signed)
History and Physical    Lindsey [name] ZOX:[id number] DOB: 1970-09-06 DOA: 09/25/2017  PCP: Billee Cashing, MD   Patient coming from: home   Chief Complaint: arm pain  HPI: Lindsey [name] is a 47 y.o. female with medical history significant of hemoglobin SS disease, opioid abuse who comes in with severe left arm pain.  Patient reports that for the past several days she has had severe left arm pain that she has been trying to manage at home with oral NSAIDs.  She has been taking 600 mg of ibuprofen every 6 hours.  She reports the pain is sharp and achy and constant.  It crescendos and decrescendo's.  It worsens on palpation and significant movement of the arm.  She denies any trauma to the arm.  She denies any arm swelling.  She has struggled with opiate addiction for approximately 20 years and has recently been off of opiates for approximately 60 days.  She is insistent that she does not want to take any additional opiates at this time.  She does not have any numbness or tingling in that left arm.  She reports that this pain is fairly typical for her sickle cell pain and is in a typical area.  She reports no chest pain, congestion, shortness of breath, cough, nausea, vomiting, abdominal pain, diarrhea, back pain.   ED Course: In the ED vitals were stable.  Patient received IV saline bolus.  She received several doses of IV hydromorphone 1 mg without adequately controlling pain.  She received IV Toradol.  Labs were notable for reticulocyte count which is fairly stable from the past year.  CBC showed hemoglobin of 10.6 which is also fairly stable for the past year.  CMP was normal.  Beta hCG was negative.  X-ray of shoulder showed no fracture or dislocation and did show sclerosis of the humeral head.  Chest x-ray showed only some linear atelectasis but no pneumonia.  Review of Systems: As per HPI otherwise 10 point review of systems negative.   Past Medical History:  Diagnosis Date  . Opioid  dependence in remission (HCC)   . Sickle cell crisis [facility])     Past Surgical History:  Procedure Laterality Date  . APPENDECTOMY    . CESAREAN SECTION    . OTHER SURGICAL HISTORY     c-section     reports that she has been smoking cigars.  She has been smoking about 0.00 packs per day. She has never used smokeless tobacco. She reports that she drinks alcohol. She reports that she does not use drugs.  No Known Allergies  History reviewed. No pertinent family history.   Prior to Admission medications   Medication Sig Start Date End Date Taking? Authorizing Provider  acetaminophen (TYLENOL 8 HOUR) 650 MG CR tablet Take 1,300 mg by mouth every 8 (eight) hours as needed for pain.   Yes [provider]  Aspirin-Acetaminophen-Caffeine (GOODY HEADACHE PO) Take 1 packet by mouth daily as needed (headache).    Yes [provider]  ibuprofen (ADVIL,MOTRIN) 800 MG tablet Take 1 tablet (800 mg total) by mouth 3 (three) times daily. 09/05/17  Yes Roxy Horseman, PA-C    Physical Exam: Vitals:   09/25/17 0757 09/25/17 0758  BP: (!) 132/98   Pulse: 88   Resp: 20   Temp: 98.7 F (37.1 C)   TempSrc: Oral   SpO2: 100%   Weight:  72.6 kg (160 lb)  Height:  5' (1.524 m)    Constitutional: NAD, calm,  comfortable Vitals:   09/25/17 0757 09/25/17 0758  BP: (!) 132/98   Pulse: 88   Resp: 20   Temp: 98.7 F (37.1 C)   TempSrc: Oral   SpO2: 100%   Weight:  72.6 kg (160 lb)  Height:  5' (1.524 m)   Eyes: Anicteric sclera ENMT: Mucous membranes are moist.  Good dentition Neck: normal, supple Respiratory: clear to auscultation bilaterally, no wheezing, no crackles. Normal respiratory effort. No accessory muscle use.  Cardiovascular: Regular rate and rhythm, 2 out of 6 systolic murmur heard best at left upper sternal border.  Abdomen: no tenderness, no masses palpated. No hepatosplenomegaly. Bowel sounds positive.  Musculoskeletal: No lower extremity edema, 2+ radial  pulses in bilateral arms, pain with palpation of proximal arm and with movement of proximal arm on both passive and active range of motion.  Able to flex and extend arm fairly well.  Intact strength Skin: no rashes visible skin Neurologic: Grossly intact, moving all extremities Psychiatric: Crying in pain   Labs on Admission: I have personally reviewed following labs and imaging studies  CBC: Recent Labs  Lab 09/25/17 0850  WBC 9.4  NEUTROABS 4.8  HGB 10.6*  HCT 29.1*  MCV 88.7  PLT 546*   Basic Metabolic Panel: Recent Labs  Lab 09/25/17 0850  NA 144  K 4.6  CL 112*  CO2 23  GLUCOSE 93  BUN 12  CREATININE 0.90  CALCIUM 9.0   GFR: Estimated Creatinine Clearance: 70.2 mL/min (by C-G formula based on SCr of 0.9 mg/dL). Liver Function Tests: Recent Labs  Lab 09/25/17 0850  AST 20  ALT 14  ALKPHOS 37*  BILITOT 1.4*  PROT 7.2  ALBUMIN 4.1   No results for input(s): LIPASE, AMYLASE in the last 168 hours. No results for input(s): AMMONIA in the last 168 hours. Coagulation Profile: No results for input(s): INR, PROTIME in the last 168 hours. Cardiac Enzymes: No results for input(s): CKTOTAL, CKMB, CKMBINDEX, TROPONINI in the last 168 hours. BNP (last 3 results) No results for input(s): PROBNP in the last 8760 hours. HbA1C: No results for input(s): HGBA1C in the last 72 hours. CBG: No results for input(s): GLUCAP in the last 168 hours. Lipid Profile: No results for input(s): CHOL, HDL, LDLCALC, TRIG, CHOLHDL, LDLDIRECT in the last 72 hours. Thyroid Function Tests: No results for input(s): TSH, T4TOTAL, FREET4, T3FREE, THYROIDAB in the last 72 hours. Anemia Panel: Recent Labs    09/25/17 0850  RETICCTPCT 5.1*   Urine analysis:    Component Value Date/Time   COLORURINE YELLOW 09/03/2016 1904   APPEARANCEUR HAZY (A) 09/03/2016 1904   LABSPEC 1.009 09/03/2016 1904   PHURINE 5.0 09/03/2016 1904   GLUCOSEU NEGATIVE 09/03/2016 1904   HGBUR NEGATIVE 09/03/2016  1904   BILIRUBINUR NEGATIVE 09/03/2016 1904   KETONESUR NEGATIVE 09/03/2016 1904   PROTEINUR NEGATIVE 09/03/2016 1904   UROBILINOGEN 1.0 12/25/2012 1755   NITRITE NEGATIVE 09/03/2016 1904   LEUKOCYTESUR SMALL (A) 09/03/2016 1904    Radiological Exams on Admission: No results found.  EKG: Independently reviewed.  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  Unfortunately she is despite her 60-day sobriety quite habituated to opiates and likely has quite a high threshold.  Unfortunately on discussion with the patient she is insistent that no PCA be ordered and that she  does not want any oral medications to go home on.  She would like her best to try to manage the symptoms with nonopiate oral medications and IV pushes when needed. - Scheduled acetaminophen thousand grams 3 times daily - Scheduled Toradol 30 g every 6 - Oral PPI when on Toradol -  hydromorphone 1.5 mg every 2 hours as needed -IV fluids -LDH ordered  #) Opioid abuse in remission: She reports only abusing oral opioid pills that were prescribed to her for sickle cell pain crisis.  She denies any IV drug use with any opiates.  She denies any other recreational drug use. -Urine drug screen  Fluids: IV fluids Electrolytes: Monitor and supplement Nutrition: Regular diet  Prophylaxis: Enoxaparin  Disposition: Pending resolution of pain crisis or at least oral pain medications  Full code    Delaine Lame MD [facility]   If 7PM-7AM, please contact night-coverage [email or web address] Password [facility]  09/25/2017, 12:26 PM

## 2017-09-26 LAB — BASIC METABOLIC PANEL WITH GFR
Anion gap: 7 (ref 5–15)
GFR calc Af Amer: >60 mL/min (ref >60)
GFR calc non Af Amer: >60 mL/min (ref >60)
Potassium: 4.1 mmol/L (ref 3.5–5.1)
Sodium: 141 mmol/L (ref 135–145)

## 2017-09-26 LAB — BASIC METABOLIC PANEL
BUN: 11 mg/dL (ref 6–20)
CO2: 24 mmol/L (ref 22–32)
Calcium: 8.5 mg/dL — ABNORMAL LOW (ref 8.9–10.3)
Chloride: 110 mmol/L (ref 101–111)
Creatinine, Ser: 0.86 mg/dL (ref 0.44–1.00)
Glucose, Bld: 106 mg/dL — ABNORMAL HIGH (ref 65–99)

## 2017-09-26 LAB — HIV ANTIBODY (ROUTINE TESTING W REFLEX): HIV Screen 4th Generation wRfx: NONREACTIVE

## 2017-09-26 LAB — CBC
HCT: 23.8 % — ABNORMAL LOW (ref 36.0–46.0)
Hemoglobin: 8.4 g/dL — ABNORMAL LOW (ref 12.0–15.0)
MCH: 31.6 pg (ref 26.0–34.0)
MCHC: 35.3 g/dL (ref 30.0–36.0)
MCV: 89.5 fL (ref 78.0–100.0)
Platelets: 413 K/uL — ABNORMAL HIGH (ref 150–400)
RBC: 2.66 MIL/uL — ABNORMAL LOW (ref 3.87–5.11)
RDW: 15.1 % (ref 11.5–15.5)
WBC: 15.2 K/uL — ABNORMAL HIGH (ref 4.0–10.5)

## 2017-09-26 LAB — LACTATE DEHYDROGENASE: LDH: 162 U/L (ref 98–192)

## 2017-09-26 LAB — RETICULOCYTES
RBC.: 2.66 MIL/uL — ABNORMAL LOW (ref 3.87–5.11)
Retic Count, Absolute: 125 10*3/uL (ref 19.0–186.0)
Retic Ct Pct: 4.7 % — ABNORMAL HIGH (ref 0.4–3.1)

## 2017-09-26 MED ORDER — SODIUM CHLORIDE 0.9% FLUSH: 10.0000 mL | INTRAVENOUS | Status: DC | PRN

## 2017-09-26 NOTE — Progress Notes (Signed)
Subjective: A 47 yo with history of sickle cell disease and previous drug abuse who went to Rehab and has been avoiding narcotics but here with Sickle cell painful crisis. Patient barely using her Dilaudid PCA. Pain is down to 4/10 today. Objective: Vital signs in last 24 hours: Temp:  [97.8 F (36.6 C)-98.3 F (36.8 C)] 98 F (36.7 C) (05/21 0531) Pulse Rate:  [70-88] 71 (05/21 0531) Resp:  [13-15] 13 (05/21 0531) BP: (108-121)/(61-85) 108/78 (05/21 0531) SpO2:  [92 %-100 %] 95 % (05/21 0531) Weight change:     Intake/Output from previous day: 05/20 0701 - 05/21 0700 In: 1014.6 [I.V.:14.6; IV Piggyback:1000] Out: -  Intake/Output this shift: No intake/output data recorded.  General appearance: alert, cooperative, appears stated age and no distress Back: symmetric, no curvature. ROM normal. No CVA tenderness. Resp: clear to auscultation bilaterally Cardio: regular rate and rhythm, S1, S2 normal, no murmur, click, rub or gallop GI: soft, non-tender; bowel sounds normal; no masses,  no organomegaly Extremities: extremities normal, atraumatic, no cyanosis or edema Pulses: 2+ and symmetric Neurologic: Grossly normal  Lab Results: Recent Labs    09/25/17 0850 09/26/17 0340  WBC 9.4 15.2*  HGB 10.6* 8.4*  HCT 29.1* 23.8*  PLT 546* 413*   BMET Recent Labs    09/25/17 0850 09/26/17 0340  NA 144 141  K 4.6 4.1  CL 112* 110  CO2 23 24  GLUCOSE 93 106*  BUN 12 11  CREATININE 0.90 0.86  CALCIUM 9.0 8.5*    Studies/Results: No results found.  Medications: I have reviewed the patient's current medications.  Assessment/Plan: A 47 yo admitted with sickle cell crisis.  1. Sickle cell painful crisis: Continue IV Dilaudid PCA and Toradol. Will start oral Oxycodone today if tolerated. Plan is to discharge not on Narcotics due to history of drug abuse.  2. Sickle cell Anemia: Hb drops to 8.4 g from 10.6. Continue to monitor  3. Leucocytosis: Due to [facility]. Continue to  monitor.   4. Thrombocytosis: Due to Asplenia.   LOS: 1 day   Regine Christian,LAWAL 09/26/2017, 9:10 AM

## 2017-09-27 DIAGNOSIS — F1110 Opioid abuse, uncomplicated: Secondary | ICD-10-CM

## 2017-09-27 MED ORDER — IBUPROFEN 800 MG PO TABS: 800.0000 mg | ORAL_TABLET | Freq: Three times a day (TID) | ORAL | 0 refills | Status: AC

## 2017-09-27 MED ORDER — ALUM & MAG HYDROXIDE-SIMETH 200-200-20 MG/5ML PO SUSP
30.0000 mL | Freq: Once | ORAL | Status: AC
  Administered 2017-09-27: 30 mL via ORAL
  Filled 2017-09-27: qty 30

## 2017-09-27 NOTE — Progress Notes (Signed)
Spoke with RN Seward Grater and made her aware that she can d/c midline and states she will do it.

## 2017-09-27 NOTE — Discharge Summary (Signed)
Physician Discharge Summary  Patient ID: Lindsey [name] MRN: [id number] DOB/AGE: Jul 26, 1970 47 y.o.  Admit date: 09/25/2017 Discharge date: 09/27/2017  Admission Diagnoses:  Discharge Diagnoses:  Active Problems:   Sickle cell pain crisis (HCC)   Opiate abuse, episodic (HCC)   Discharged Condition: good  Hospital Course: Patient was admitted and had to be given IV Diludid with toradol. Pain was controlled on minimal dosing. Patient ultimately did better. She was transitioned to Ibuprofen only. Patient does not want to take Narcotics at home. She will follow up with her PCP. Given 800 mg tablets of Ibuprofen.   Consults: None  Significant Diagnostic Studies: labs: Serial CBCs and CMPs checked. No transfusion required.  Treatments: IV hydration and analgesia: acetaminophen and Dilaudid  Discharge Exam: Blood pressure 130/75, pulse 78, temperature 98.4 F (36.9 C), temperature source Oral, resp. rate 20, height 5' (1.524 m), weight 79.5 kg (175 lb 4.3 oz), last menstrual period 09/25/2017, SpO2 96 %. General appearance: alert, cooperative, appears stated age and no distress Neck: no adenopathy, no carotid bruit, no JVD, supple, symmetrical, trachea midline and thyroid not enlarged, symmetric, no tenderness/mass/nodules Resp: clear to auscultation bilaterally Cardio: regular rate and rhythm, S1, S2 normal, no murmur, click, rub or gallop GI: soft, non-tender; bowel sounds normal; no masses,  no organomegaly Extremities: extremities normal, atraumatic, no cyanosis or edema Neurologic: Grossly normal  Disposition: Discharge disposition: 01-Home or Self Care       Discharge Instructions    Diet - low sodium heart healthy   Complete by:  As directed    Increase activity slowly   Complete by:  As directed      Allergies as of 09/27/2017   No Known Allergies     Medication List    TAKE these medications   GOODY HEADACHE PO Take 1 packet by mouth daily as needed  (headache).   ibuprofen 800 MG tablet Commonly known as:  ADVIL,MOTRIN Take 1 tablet (800 mg total) by mouth 3 (three) times daily.   TYLENOL 8 HOUR 650 MG CR tablet Generic drug:  acetaminophen Take 1,300 mg by mouth every 8 (eight) hours as needed for pain.        SignedLonia Blood 09/27/2017, 10:37 AM   Time spent 33 minutes

## 2017-10-01 DIAGNOSIS — F1121 Opioid dependence, in remission: Secondary | ICD-10-CM | POA: Insufficient documentation

## 2017-10-01 DIAGNOSIS — G8929 Other chronic pain: Secondary | ICD-10-CM | POA: Insufficient documentation

## 2017-10-01 DIAGNOSIS — D571 Sickle-cell disease without crisis: Secondary | ICD-10-CM | POA: Insufficient documentation

## 2017-10-05 ENCOUNTER — Other Ambulatory Visit (HOSPITAL_COMMUNITY): Payer: Self-pay

## 2017-10-05 ENCOUNTER — Other Ambulatory Visit: Payer: Self-pay

## 2017-10-05 ENCOUNTER — Ambulatory Visit (INDEPENDENT_AMBULATORY_CARE_PROVIDER_SITE_OTHER): Payer: 59 | Admitting: Family Medicine

## 2017-10-05 ENCOUNTER — Encounter: Payer: Self-pay | Admitting: Family Medicine

## 2017-10-05 VITALS — BP 140/82 | HR 62 | Temp 98.7°F | Resp 16 | Ht 60.0 in | Wt 167.0 lb

## 2017-10-05 DIAGNOSIS — Z23 Encounter for immunization: Secondary | ICD-10-CM | POA: Diagnosis not present

## 2017-10-05 DIAGNOSIS — D571 Sickle-cell disease without crisis: Secondary | ICD-10-CM | POA: Diagnosis not present

## 2017-10-05 MED ORDER — GABAPENTIN 100 MG PO CAPS: 100.0000 mg | ORAL_CAPSULE | Freq: Three times a day (TID) | ORAL | 3 refills | Status: DC

## 2017-10-05 NOTE — Patient Instructions (Signed)
We will start a trial of gabapentin 100 mg 3 times a day.  Do not drink alcohol, drive, or operate machinery while taking this medication.  We will also start a trial of folic acid 1 mg daily.  Discussed the importance of drinking 64 ounces of water daily. The Importance of Water. To help prevent pain crises, it is important to drink plenty of water throughout the day. This is because dehydration of red blood cells may lead to the sickling process.    Also, recommend a balanced diet over small meals throughout the day.  Decrease overall sugar intake. Smoking cessation discussed at length.  Discussed the fact that smoking stimulates bone marrow which can precipitate a sickle cell crisis.  He has received a pneumococcal 23 vaccination, without complication.    Sickle Cell Anemia, Adult Sickle cell anemia is a condition where your red blood cells are shaped like sickles. Red blood cells carry oxygen through the body. Sickle-shaped red blood cells do not live as long as normal red blood cells. They also clump together and block blood from flowing through the blood vessels. These things prevent the body from getting enough oxygen. Sickle cell anemia causes organ damage and pain. It also increases the risk of infection. Follow these instructions at home:  Drink enough fluid to keep your pee (urine) clear or pale yellow. Drink more in hot weather and during exercise.  Do not smoke. Smoking lowers oxygen levels in the blood.  Only take over-the-counter or prescription medicines as told by your doctor.  Take antibiotic medicines as told by your doctor. Make sure you finish them even if you start to feel better.  Take supplements as told by your doctor.  Consider wearing a medical alert bracelet. This tells anyone caring for you in an emergency of your condition.  When traveling, keep your medical information, doctors' names, and the medicines you take with you at all times.  If you have a fever,  do not take fever medicines right away. This could cover up a problem. Tell your doctor.  Keep all follow-up visits with your doctor. Sickle cell anemia requires regular medical care. Contact a doctor if: You have a fever. Get help right away if:  You feel dizzy or faint.  You have new belly (abdominal) pain, especially on the left side near the stomach area.  You have a lasting, often uncomfortable and painful erection of the penis (priapism). If it is not treated right away, you will become unable to have sex (impotence).  You have numbness in your arms or legs or you have a hard time moving them.  You have a hard time talking.  You have a fever or lasting symptoms for more than 2-3 days.  You have a fever and your symptoms suddenly get worse.  You have signs or symptoms of infection. These include: ? Chills. ? Being more tired than normal (lethargy). ? Irritability. ? Poor eating. ? Throwing up (vomiting).  You have pain that is not helped with medicine.  You have shortness of breath.  You have pain in your chest.  You are coughing up pus-like or bloody mucus.  You have a stiff neck.  Your feet or hands swell or have pain.  Your belly looks bloated.  Your joints hurt. This information is not intended to replace advice given to you by your health care provider. Make sure you discuss any questions you have with your health care provider. Document Released: 02/13/2013 Document Revised: 10/01/2015  Document Reviewed: 12/05/2012 Elsevier Interactive Patient Education  2017 [employer].

## 2017-10-06 ENCOUNTER — Other Ambulatory Visit: Payer: Self-pay | Admitting: Family Medicine

## 2017-10-06 ENCOUNTER — Telehealth: Payer: Self-pay

## 2017-10-06 DIAGNOSIS — E559 Vitamin D deficiency, unspecified: Secondary | ICD-10-CM

## 2017-10-06 LAB — CBC WITH DIFFERENTIAL/PLATELET
BASOS ABS: 0 10*3/uL (ref 0.0–0.2)
Basos: 0 %
EOS (ABSOLUTE): 0.2 10*3/uL (ref 0.0–0.4)
Eos: 2 %
Hematocrit: 27.3 % — ABNORMAL LOW (ref 34.0–46.6)
Hemoglobin: 9.3 g/dL — ABNORMAL LOW (ref 11.1–15.9)
Immature Grans (Abs): 0 10*3/uL (ref 0.0–0.1)
Immature Granulocytes: 0 %
Lymphocytes Absolute: 4 10*3/uL — ABNORMAL HIGH (ref 0.7–3.1)
Lymphs: 40 %
MCH: 32.4 pg (ref 26.6–33.0)
MCHC: 34.1 g/dL (ref 31.5–35.7)
MCV: 95 fL (ref 79–97)
MONOS ABS: 0.7 10*3/uL (ref 0.1–0.9)
Monocytes: 6 %
NEUTROS ABS: 5.3 10*3/uL (ref 1.4–7.0)
Neutrophils: 52 %
Platelets: 417 10*3/uL (ref 150–450)
RBC: 2.87 x10E6/uL — AB (ref 3.77–5.28)
RDW: 16.8 % — AB (ref 12.3–15.4)
WBC: 10.2 10*3/uL (ref 3.4–10.8)

## 2017-10-06 LAB — COMPREHENSIVE METABOLIC PANEL
ALT: 15 IU/L (ref 0–32)
AST: 16 IU/L (ref 0–40)
Albumin/Globulin Ratio: 2 (ref 1.2–2.2)
Albumin: 4.5 g/dL (ref 3.5–5.5)
Alkaline Phosphatase: 48 IU/L (ref 39–117)
BUN/Creatinine Ratio: 10 (ref 9–23)
BUN: 8 mg/dL (ref 6–24)
Bilirubin Total: 1.3 mg/dL — ABNORMAL HIGH (ref 0.0–1.2)
CALCIUM: 9.3 mg/dL (ref 8.7–10.2)
CO2: 20 mmol/L (ref 20–29)
CREATININE: 0.84 mg/dL (ref 0.57–1.00)
Chloride: 112 mmol/L — ABNORMAL HIGH (ref 96–106)
GFR, EST AFRICAN AMERICAN: 97 mL/min/{1.73_m2} (ref >59)
GFR, EST NON AFRICAN AMERICAN: 84 mL/min/{1.73_m2} (ref >59)
Globulin, Total: 2.2 g/dL (ref 1.5–4.5)
Glucose: 85 mg/dL (ref 65–99)
Potassium: 3.9 mmol/L (ref 3.5–5.2)
Sodium: 143 mmol/L (ref 134–144)
TOTAL PROTEIN: 6.7 g/dL (ref 6.0–8.5)

## 2017-10-06 LAB — FERRITIN: FERRITIN: 184 ng/mL — AB (ref 15–150)

## 2017-10-06 LAB — VITAMIN D 25 HYDROXY (VIT D DEFICIENCY, FRACTURES): VIT D 25 HYDROXY: 13.3 ng/mL — AB (ref 30.0–100.0)

## 2017-10-06 MED ORDER — ERGOCALCIFEROL 1.25 MG (50000 UT) PO CAPS: 50000.0000 [IU] | ORAL_CAPSULE | ORAL | 1 refills | Status: DC

## 2017-10-06 NOTE — Progress Notes (Signed)
Meds ordered this encounter  Medications  . ergocalciferol (DRISDOL) 50000 units capsule    Sig: Take 1 capsule (50,000 Units total) by mouth once a week.    Dispense:  30 capsule    Refill:  1    Beanca Kiester Moore Anida Deol  MSN, FNP-C [facility] [address]  [place], [place] [address] [phone]  

## 2017-10-06 NOTE — Telephone Encounter (Signed)
Patient called back and I advised that vitamin D was decreased and we need to start vitamin D 50,000 units once weekly. Patient verbalized understanding and will pick up medication at pharmacy and start. Thanks!

## 2017-10-06 NOTE — Telephone Encounter (Signed)
Called, no answer and left a message for patient to call back and left call back number. Thanks!

## 2017-10-06 NOTE — Telephone Encounter (Signed)
-----   Message from Massie Maroon, [place] sent at 10/06/2017  3:31 PM EDT ----- Regarding: lab results Please inform patient that vitamin D is markedly decreased. Will start Drisdol 50,000 units weekly. All other labs unremarkable. Follow up in office as scheduled.   [name]  MSN, FNP-C Patient Care [facility] [address] [place], [place] [address] [phone]

## 2017-10-09 NOTE — Progress Notes (Signed)
Subjective:    Patient ID: Lindsey [name], female    DOB: 03/02/1971, 47 y.o.   MRN: [id number]  HPI Lindsey [name], a 47 year old female with a history of sickle cell anemia, hemoglobin [place] presents to establish care.  Ms. Drotar was a patient of Dr. Rosamaria Lints prior to establishing care.  Patient states that she had a history of chronic pain and was previously admitted to a drug rehabilitation center in [place]. Patient states that she was on high dosages of opiate medications for many years. She says that she had gotten to the point where she was taking medications during times that she was not having pain. She says that she had a problem that was affecting her quality of life. She completed program and is completely off of opiate medications.   Ms. Worthington is having mild pain to lower extremities at present. Pain intensity is 1-2/10 characterized as intermittent and aching. Patient typically takes Ibuprofen for pain control.  She is not followed by hematology for sickle cell anemia. Patient does not take folic acid or vitamin D.  She denies headache, chest pain, heart palpitations, shortness of breath, dysuria, nausea, vomiting, or diarrhea.     Past Medical History:  Diagnosis Date  . 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 Opioid dependence in remission (HCC)   . Sickle cell crisis [facility])    Social History   Socioeconomic History  . Marital status: Married    Spouse name: Not on file  . Number of children: Not on file  . Years of education: Not on file  . Highest education level: Not on file  Occupational History  . Not on file  Social Needs  . Financial resource strain: Not on file  . Food insecurity:    Worry: Not on file    Inability: Not on file  . Transportation needs:    Medical: Not on file    Non-medical: Not on file  Tobacco Use  . Smoking status: Current Some Day Smoker    Packs/day: 0.00    Types: Cigars  . Smokeless tobacco: Never Used  Substance and  Sexual Activity  . Alcohol use: Yes    Comment: occasionally  . Drug use: No  . Sexual activity: Not on file  Lifestyle  . Physical activity:    Days per week: Not on file    Minutes per session: Not on file  . Stress: Not on file  Relationships  . Social connections:    Talks on phone: Not on file    Gets together: Not on file    Attends religious service: Not on file    Active member of club or organization: Not on file    Attends meetings of clubs or organizations: Not on file    Relationship status: Not on file  . Intimate partner violence:    Fear of current or ex partner: Not on file    Emotionally abused: Not on file    Physically abused: Not on file    Forced sexual activity: Not on file  Other Topics Concern  . Not on file  Social History Narrative  . Not on file   Immunization History  Administered Date(s) Administered  . Pneumococcal Polysaccharide-23 10/05/2017    Review of Systems  Constitutional: Negative.   HENT: Negative.   Eyes: Negative for visual disturbance.  Respiratory: Negative.   Cardiovascular: Negative.   Gastrointestinal: Negative.   Endocrine: Negative for polydipsia, polyphagia and polyuria.  Genitourinary: Negative.  Musculoskeletal: Negative.  Negative for joint swelling and myalgias.  Skin: Negative.   Neurological: Negative.   Hematological: Negative.   Psychiatric/Behavioral: Negative.  Negative for sleep disturbance and suicidal ideas.       Objective:   Physical Exam  Constitutional: She is oriented to person, place, and time.  HENT:  Head: Normocephalic.  Right Ear: External ear normal.  Left Ear: External ear normal.  Nose: Nose normal.  Mouth/Throat: Oropharynx is clear and moist. No oropharyngeal exudate.  Eyes: Pupils are equal, round, and reactive to light.  Neck: Normal range of motion.  Cardiovascular: Regular rhythm and normal heart sounds.  Pulmonary/Chest: Effort normal and breath sounds normal.  Abdominal:  Soft. Bowel sounds are normal.  Musculoskeletal: Normal range of motion.  Neurological: She is alert and oriented to person, place, and time.  Skin: Skin is warm and dry.  Psychiatric: She has a normal mood and affect. Her behavior is normal. Judgment and thought content normal.      BP 140/82 (BP Location: Left Arm, Patient Position: Sitting, Cuff Size: Normal)   Pulse 62   Temp 98.7 F (37.1 C) (Oral)   Resp 16   Ht 5' (1.524 m)   Wt 167 lb (75.8 kg)   LMP 09/25/2017   SpO2 100%   BMI 32.61 kg/m  Assessment & Plan:  1. Hb-SS disease without crisis (HCC) Will start folic acid 1 mg daily to prevent aplastic bone marrow crises.   Pulmonary evaluation - Patient denies severe recurrent wheezes, shortness of breath with exercise, or persistent cough. If these symptoms develop, pulmonary function tests with spirometry will be ordered, and if abnormal, plan on referral to Pulmonology for further evaluation.  Cardiac - Routine screening for pulmonary hypertension is not recommended.  Eye - History of proliferative retinopathy. Annual eye exam with retinal exam recommended to patient. Last eye examination several months ago. Patient is requesting a referral for new opthalmologist  Immunization status - will review [facility]  Acute and chronic painful episodes - Restarting opiate medications is not appropriate. Recommend that patient take Ibuprofen 800 mg every 8 hours as needed for mild to moderate pain and gabapentin for moderate to severe pain associated with sickle cell anemia   - Iron overload from chronic transfusion. Will check ferritin levels.   - Vitamin D, 25-hydroxy - Comprehensive metabolic panel - CBC with Differential - gabapentin (NEURONTIN) 100 MG capsule; Take 1 capsule (100 mg total) by mouth 3 (three) times daily.  Dispense: 90 capsule; Refill: 3 - Ambulatory referral to Ophthalmology - EKG 12-Lead - Ferritin  2. Immunization due - Pneumococcal polysaccharide vaccine  23-valent greater than or equal to 2yo subcutaneous/IM   RTC: 3 months for sickle cell anemia. Repeat vitamin D level.     The patient was given clear instructions to go to ER or return to medical center if symptoms do not improve, worsen or new problems develop. The patient verbalized understanding.     Nolon NationsLachina Moore Aison Malveaux  MSN, FNP-C Patient Care [facility] [address]  [place], [place] [id number] [phone]

## 2017-11-29 ENCOUNTER — Telehealth: Payer: Self-pay

## 2017-11-29 MED ORDER — IBUPROFEN 800 MG PO TABS: 800.0000 mg | ORAL_TABLET | Freq: Three times a day (TID) | ORAL | 1 refills | Status: DC | PRN

## 2017-11-29 NOTE — Telephone Encounter (Signed)
Lindsey [name],  Patient is asking for a refill for ibuprofen. This is not on current med list. She last had this prescribed in 09/2017. Can this be refilled. Please advise. Thanks!

## 2017-11-29 NOTE — Telephone Encounter (Signed)
Refilled

## 2017-12-06 ENCOUNTER — Ambulatory Visit: Payer: 59 | Admitting: Family Medicine

## 2018-03-30 ENCOUNTER — Encounter: Payer: Self-pay | Admitting: Family Medicine

## 2018-03-30 ENCOUNTER — Ambulatory Visit (INDEPENDENT_AMBULATORY_CARE_PROVIDER_SITE_OTHER): Payer: Self-pay | Admitting: Family Medicine

## 2018-03-30 VITALS — BP 127/72 | HR 77 | Temp 98.8°F | Resp 16 | Ht 60.0 in | Wt 169.0 lb

## 2018-03-30 DIAGNOSIS — D571 Sickle-cell disease without crisis: Secondary | ICD-10-CM

## 2018-03-30 DIAGNOSIS — G4700 Insomnia, unspecified: Secondary | ICD-10-CM

## 2018-03-30 DIAGNOSIS — Z23 Encounter for immunization: Secondary | ICD-10-CM

## 2018-03-30 DIAGNOSIS — D5700 Hb-SS disease with crisis, unspecified: Secondary | ICD-10-CM

## 2018-03-30 LAB — POCT URINALYSIS DIPSTICK
Bilirubin, UA: NEGATIVE
Glucose, UA: NEGATIVE
Ketones, UA: NEGATIVE
Nitrite, UA: NEGATIVE
Protein, UA: NEGATIVE
Spec Grav, UA: 1.015 (ref 1.010–1.025)
Urobilinogen, UA: 0.2 E.U./dL
pH, UA: 5 (ref 5.0–8.0)

## 2018-03-30 MED ORDER — HYDROXYZINE HCL 25 MG PO TABS: 25.0000 mg | ORAL_TABLET | Freq: Every day | ORAL | 2 refills | Status: DC

## 2018-03-30 MED ORDER — IBUPROFEN 800 MG PO TABS: 800.0000 mg | ORAL_TABLET | Freq: Three times a day (TID) | ORAL | 5 refills | Status: DC | PRN

## 2018-03-30 MED ORDER — GABAPENTIN 100 MG PO CAPS: 100.0000 mg | ORAL_CAPSULE | Freq: Three times a day (TID) | ORAL | 5 refills | Status: DC

## 2018-03-30 NOTE — Patient Instructions (Addendum)
Our next sickle cell group meeting will be May 16, 2017 from 530-630 pm. Dinner will be provided. We will meet in one of the small [facility]. I will keep you posted on the specific location once we get closer to the event.  It was a pleasure meeting you today. I admire you for your strong spirit and determination. Keep up the good work.  I will see you in 3 months.     Sickle Cell Anemia, Adult Sickle cell anemia is a condition where your red blood cells are shaped like sickles. Red blood cells carry oxygen through the body. Sickle-shaped red blood cells do not live as long as normal red blood cells. They also clump together and block blood from flowing through the blood vessels. These things prevent the body from getting enough oxygen. Sickle cell anemia causes organ damage and pain. It also increases the risk of infection. Follow these instructions at home:  Drink enough fluid to keep your pee (urine) clear or pale yellow. Drink more in hot weather and during exercise.  Do not smoke. Smoking lowers oxygen levels in the blood.  Only take over-the-counter or prescription medicines as told by your doctor.  Take antibiotic medicines as told by your doctor. Make sure you finish them even if you start to feel better.  Take supplements as told by your doctor.  Consider wearing a medical alert bracelet. This tells anyone caring for you in an emergency of your condition.  When traveling, keep your medical information, doctors' names, and the medicines you take with you at all times.  If you have a fever, do not take fever medicines right away. This could cover up a problem. Tell your doctor.  Keep all follow-up visits with your doctor. Sickle cell anemia requires regular medical care. Contact a doctor if: You have a fever. Get help right away if:  You feel dizzy or faint.  You have new belly (abdominal) pain, especially on the left side near the  stomach area.  You have a lasting, often uncomfortable and painful erection of the penis (priapism). If it is not treated right away, you will become unable to have sex (impotence).  You have numbness in your arms or legs or you have a hard time moving them.  You have a hard time talking.  You have a fever or lasting symptoms for more than 2-3 days.  You have a fever and your symptoms suddenly get worse.  You have signs or symptoms of infection. These include: ? Chills. ? Being more tired than normal (lethargy). ? Irritability. ? Poor eating. ? Throwing up (vomiting).  You have pain that is not helped with medicine.  You have shortness of breath.  You have pain in your chest.  You are coughing up pus-like or bloody mucus.  You have a stiff neck.  Your feet or hands swell or have pain.  Your belly looks bloated.  Your joints hurt. This information is not intended to replace advice given to you by your health care provider. Make sure you discuss any questions you have with your health care provider. Document Released: 02/13/2013 Document Revised: 10/01/2015 Document Reviewed: 12/05/2012 Elsevier Interactive Patient Education  2017 [employer]

## 2018-03-30 NOTE — Progress Notes (Signed)
[facility] INTERNAL MEDICINE AND SICKLE CELL CARE  SICKLE CELL ANEMIA FOLLOW UP VISIT PROVIDER: Mike GipAndre Keidy Thurgood, FNP    Subjective:   Lindsey [name]  is a 47 y.o.  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 presents for a follow up for Sickle Cell Anemia. Patient was a former patient of Dr. Ronne BinningMckenzie. [place] addiction to [place]iates. She is currently not on any form of narcotic medications.  Patient states that she is having difficulty with staying asleep. Has tried several otc medications without relief. Patient states that she tried trazodone while in rehab with increased ability to fall asleep, but did not stay asleep.  The patient has had no admissions in the past 6 months.  Pain regimen includes: Ibuprofen and gabapentin Hydrea Therapy: No Medication compliance: Yes  Pain today is 0/10 today.  The patient reports adequate daily hydration.     Review of Systems  Constitutional: Negative.   HENT: Negative.   Eyes: Negative.   Respiratory: Negative.   Cardiovascular: Negative.   Gastrointestinal: Negative.   Genitourinary: Negative.   Musculoskeletal: Negative.   Skin: Negative.   Neurological: Negative.   Psychiatric/Behavioral: Negative.     Objective:   Objective  BP 127/72 (BP Location: Right Arm, Patient Position: Sitting, Cuff Size: Normal)   Pulse 77   Temp 98.8 F (37.1 C) (Oral)   Resp 16   Ht 5' (1.524 m)   Wt 169 lb (76.7 kg)   LMP 03/27/2018   SpO2 98%   BMI 33.01 kg/m   Wt Readings from Last 3 Encounters:  03/30/18 169 lb (76.7 kg)  10/05/17 167 lb (75.8 kg)  09/27/17 175 lb 4.3 oz (79.5 kg)     Physical Exam  Constitutional: She is oriented to person, place, and time. She appears well-developed and well-nourished. No distress.  HENT:  Head: Normocephalic and atraumatic.  Eyes: Pupils are  equal, round, and reactive to light. Conjunctivae and EOM are normal.  Neck: Normal range of motion.  Cardiovascular: Normal rate, regular rhythm, normal heart sounds and intact distal pulses.  Pulmonary/Chest: Effort normal and breath sounds normal. No respiratory distress.  Abdominal: Soft. Bowel sounds are normal. She exhibits no distension.  Musculoskeletal: Normal range of motion.  Neurological: She is alert and oriented to person, place, and time.  Skin: Skin is warm and dry.  Psychiatric: She has a normal mood and affect. Her behavior is normal. Judgment and thought content normal.  Nursing note and vitals reviewed.    Assessment/Plan:   Assessment   Encounter Diagnoses  Name Primary?  [name]. 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 Insomnia, unspecified type      Plan    Hb-SS disease without crisis (HCC) - CBC with Differential - Comprehensive metabolic panel - Vitamin D, 25-hydroxy - Iron, TIBC and Ferritin Panel - gabapentin (NEURONTIN) 100 MG capsule; Take 1 capsule (100 mg total) by mouth 3 (three) times daily. Take 1 BID x 2 weeks then 1 QHS there after. Take TID with crisis  Dispense: 90 capsule; Refill: 5 - ibuprofen (ADVIL,MOTRIN) 800 MG tablet; Take 1 tablet (800 mg total) by mouth every 8 (eight) hours as needed for moderate pain.  Dispense: 60 tablet; Refill: 5   Insomnia, unspecified type - hydrOXYzine (ATARAX/VISTARIL) 25 MG tablet; Take 1-2 tablets (25-50 mg total) by mouth at bedtime.  Dispense:  60 tablet; Refill: 2   Return to care as scheduled and prn. Patient verbalized understanding and agreed with plan of care.   1. Sickle cell disease -  We discussed the need for good hydration, monitoring of hydration status, avoidance of heat, cold, stress, and infection triggers. We discussed the risks and benefits of Hydrea, including bone marrow suppression, the possibility of GI upset, skin ulcers, hair thinning, and teratogenicity. The patient was reminded of the  need to seek medical attention of any symptoms of bleeding, anemia, or infection. Continue folic acid 1 mg daily to prevent aplastic bone marrow crises.   2. Pulmonary evaluation - Patient denies severe recurrent wheezes, shortness of breath with exercise, or persistent cough. If these symptoms develop, pulmonary function tests with spirometry will be ordered, and if abnormal, plan on referral to Pulmonology for further evaluation.  3. Cardiac - Routine screening for pulmonary hypertension is not recommended.  4. Eye - High risk of proliferative retinopathy. Annual eye exam with retinal exam recommended to patient.  5. Immunization status -  Yearly influenza vaccination is recommended, as well as being up to date with Meningococcal and Pneumococcal vaccines.   6. Acute and chronic painful episodes - Patient not to receive narcotics for pain. She had rehab for opiate addiction.   7. Iron overload from chronic transfusion.  Not applicable at this time.  If this occurs will use Exjade for management.   8. Vitamin D deficiency - Drisdol 50,000 units weekly. Patient encouraged to take as prescribed.   The above recommendations are taken from the NIH Evidence-Based Management of Sickle Cell Disease: Expert Panel Report, 16109.   Return in about 3 months (around 06/30/2018).   Lindsey [name]. Lindsey Lam, FNP-BC [facility] [address] [place], [place] [address] [phone]  This note has been created with Dragon speech recognition software and smart phrase technology. Any transcriptional errors are unintentional.

## 2018-03-31 LAB — COMPREHENSIVE METABOLIC PANEL
ALT: 11 IU/L (ref 0–32)
AST: 15 IU/L (ref 0–40)
Albumin/Globulin Ratio: 2 (ref 1.2–2.2)
Albumin: 4.3 g/dL (ref 3.5–5.5)
Alkaline Phosphatase: 39 IU/L (ref 39–117)
BUN/Creatinine Ratio: 12 (ref 9–23)
BUN: 10 mg/dL (ref 6–24)
Bilirubin Total: 0.9 mg/dL (ref 0.0–1.2)
CO2: 20 mmol/L (ref 20–29)
Calcium: 9.3 mg/dL (ref 8.7–10.2)
Chloride: 109 mmol/L — ABNORMAL HIGH (ref 96–106)
Creatinine, Ser: 0.85 mg/dL (ref 0.57–1.00)
GFR calc Af Amer: 95 mL/min/{1.73_m2} (ref >59)
GFR calc non Af Amer: 82 mL/min/{1.73_m2} (ref >59)
Globulin, Total: 2.1 g/dL (ref 1.5–4.5)
Glucose: 82 mg/dL (ref 65–99)
Potassium: 4.6 mmol/L (ref 3.5–5.2)
Sodium: 142 mmol/L (ref 134–144)
Total Protein: 6.4 g/dL (ref 6.0–8.5)

## 2018-03-31 LAB — CBC WITH DIFFERENTIAL/PLATELET
Basophils Absolute: 0.1 10*3/uL (ref 0.0–0.2)
Basos: 1 %
EOS (ABSOLUTE): 0.4 10*3/uL (ref 0.0–0.4)
Eos: 4 %
Hematocrit: 29.8 % — ABNORMAL LOW (ref 34.0–46.6)
Hemoglobin: 10.1 g/dL — ABNORMAL LOW (ref 11.1–15.9)
Immature Grans (Abs): 0 10*3/uL (ref 0.0–0.1)
Immature Granulocytes: 0 %
Lymphocytes Absolute: 2.8 10*3/uL (ref 0.7–3.1)
Lymphs: 28 %
MCH: 31.7 pg (ref 26.6–33.0)
MCHC: 33.9 g/dL (ref 31.5–35.7)
MCV: 93 fL (ref 79–97)
Monocytes Absolute: 0.6 10*3/uL (ref 0.1–0.9)
Monocytes: 6 %
Neutrophils Absolute: 6.1 10*3/uL (ref 1.4–7.0)
Neutrophils: 61 %
Platelets: 517 10*3/uL — ABNORMAL HIGH (ref 150–450)
RBC: 3.19 x10E6/uL — ABNORMAL LOW (ref 3.77–5.28)
RDW: 15.3 % (ref 12.3–15.4)
WBC: 9.9 10*3/uL (ref 3.4–10.8)

## 2018-03-31 LAB — VITAMIN D 25 HYDROXY (VIT D DEFICIENCY, FRACTURES): Vit D, 25-Hydroxy: 18.8 ng/mL — ABNORMAL LOW (ref 30.0–100.0)

## 2018-03-31 LAB — IRON,TIBC AND FERRITIN PANEL
Ferritin: 130 ng/mL (ref 15–150)
Iron Saturation: 38 % (ref 15–55)
Iron: 91 ug/dL (ref 27–159)
Total Iron Binding Capacity: 242 ug/dL — ABNORMAL LOW (ref 250–450)
UIBC: 151 ug/dL (ref 131–425)

## 2018-07-02 ENCOUNTER — Ambulatory Visit: Payer: Self-pay | Admitting: Family Medicine

## 2018-07-09 ENCOUNTER — Other Ambulatory Visit: Payer: Self-pay | Admitting: Family Medicine

## 2018-07-09 DIAGNOSIS — G4700 Insomnia, unspecified: Secondary | ICD-10-CM

## 2018-07-26 ENCOUNTER — Other Ambulatory Visit: Payer: Self-pay

## 2018-07-26 ENCOUNTER — Emergency Department (HOSPITAL_COMMUNITY): Payer: Self-pay

## 2018-07-26 ENCOUNTER — Emergency Department (HOSPITAL_COMMUNITY)
Admission: EM | Admit: 2018-07-26 | Discharge: 2018-07-26 | Disposition: A | Discharge status: Home / Self Care | Payer: Self-pay | Attending: Emergency Medicine | Admitting: Emergency Medicine

## 2018-07-26 ENCOUNTER — Encounter (HOSPITAL_COMMUNITY): Payer: Self-pay

## 2018-07-26 DIAGNOSIS — D5700 Hb-SS disease with crisis, unspecified: Secondary | ICD-10-CM | POA: Insufficient documentation

## 2018-07-26 DIAGNOSIS — R079 Chest pain, unspecified: Secondary | ICD-10-CM

## 2018-07-26 DIAGNOSIS — F17290 Nicotine dependence, other tobacco product, uncomplicated: Secondary | ICD-10-CM | POA: Insufficient documentation

## 2018-07-26 DIAGNOSIS — R0789 Other chest pain: Secondary | ICD-10-CM | POA: Insufficient documentation

## 2018-07-26 LAB — CBC WITH DIFFERENTIAL/PLATELET
Abs Immature Granulocytes: 0.06 10*3/uL (ref 0.00–0.07)
Basophils Absolute: 0.1 10*3/uL (ref 0.0–0.1)
Basophils Relative: 1 %
Eosinophils Absolute: 0.5 10*3/uL (ref 0.0–0.5)
Eosinophils Relative: 4 %
HCT: 26.6 % — ABNORMAL LOW (ref 36.0–46.0)
Hemoglobin: 9.8 g/dL — ABNORMAL LOW (ref 12.0–15.0)
Immature Granulocytes: 0 %
Lymphocytes Relative: 44 %
Lymphs Abs: 6.5 10*3/uL — ABNORMAL HIGH (ref 0.7–4.0)
MCH: 31.2 pg (ref 26.0–34.0)
MCHC: 36.8 g/dL — ABNORMAL HIGH (ref 30.0–36.0)
MCV: 84.7 fL (ref 80.0–100.0)
Monocytes Absolute: 0.8 10*3/uL (ref 0.1–1.0)
Monocytes Relative: 6 %
NRBC: 0.3 % — AB (ref 0.0–0.2)
Neutro Abs: 6.5 10*3/uL (ref 1.7–7.7)
Neutrophils Relative %: 45 %
Platelets: 520 10*3/uL — ABNORMAL HIGH (ref 150–400)
RBC: 3.14 MIL/uL — AB (ref 3.87–5.11)
RDW: 13.5 % (ref 11.5–15.5)
WBC: 14.5 10*3/uL — ABNORMAL HIGH (ref 4.0–10.5)

## 2018-07-26 LAB — COMPREHENSIVE METABOLIC PANEL
ALT: 15 U/L (ref 0–44)
AST: 20 U/L (ref 15–41)
Albumin: 3.9 g/dL (ref 3.5–5.0)
Alkaline Phosphatase: 37 U/L — ABNORMAL LOW (ref 38–126)
Anion gap: 7 (ref 5–15)
BUN: 10 mg/dL (ref 6–20)
CO2: 23 mmol/L (ref 22–32)
Calcium: 8.9 mg/dL (ref 8.9–10.3)
Chloride: 109 mmol/L (ref 98–111)
Creatinine, Ser: 0.89 mg/dL (ref 0.44–1.00)
GFR calc non Af Amer: >60 mL/min (ref >60)
Glucose, Bld: 82 mg/dL (ref 70–99)
Potassium: 4.1 mmol/L (ref 3.5–5.1)
Sodium: 139 mmol/L (ref 135–145)
Total Bilirubin: 1.1 mg/dL (ref 0.3–1.2)
Total Protein: 6.7 g/dL (ref 6.5–8.1)

## 2018-07-26 LAB — D-DIMER, QUANTITATIVE: D-Dimer, Quant: 0.7 ug/mL-FEU — ABNORMAL HIGH (ref 0.00–0.50)

## 2018-07-26 LAB — RETICULOCYTES
Immature Retic Fract: 21.9 % — ABNORMAL HIGH (ref 2.3–15.9)
RBC.: 3.14 MIL/uL — AB (ref 3.87–5.11)
RETIC CT PCT: 4.5 % — AB (ref 0.4–3.1)
Retic Count, Absolute: 142.6 10*3/uL (ref 19.0–186.0)

## 2018-07-26 LAB — TROPONIN I: Troponin I: <0.03 ng/mL (ref <0.03)

## 2018-07-26 MED ORDER — SODIUM CHLORIDE 0.9 % IV BOLUS
1000.0000 mL | Freq: Once | INTRAVENOUS | Status: AC
  Administered 2018-07-26: 1000 mL via INTRAVENOUS

## 2018-07-26 MED ORDER — OXYCODONE-ACETAMINOPHEN 5-325 MG PO TABS
1.0000 | ORAL_TABLET | Freq: Once | ORAL | Status: DC
  Filled 2018-07-26: qty 1

## 2018-07-26 MED ORDER — IOPAMIDOL (ISOVUE-370) INJECTION 76%
75.0000 mL | Freq: Once | INTRAVENOUS | Status: AC | PRN
  Administered 2018-07-26: 75 mL via INTRAVENOUS

## 2018-07-26 MED ORDER — KETOROLAC TROMETHAMINE 60 MG/2ML IM SOLN
30.0000 mg | Freq: Once | INTRAMUSCULAR | Status: AC
  Administered 2018-07-26: 30 mg via INTRAMUSCULAR

## 2018-07-26 MED ORDER — KETOROLAC TROMETHAMINE 30 MG/ML IJ SOLN
INTRAMUSCULAR | Status: AC
  Filled 2018-07-26: qty 1

## 2018-07-26 MED ORDER — KETOROLAC TROMETHAMINE 30 MG/ML IJ SOLN
30.0000 mg | Freq: Once | INTRAMUSCULAR | Status: AC
  Administered 2018-07-26: 30 mg via INTRAVENOUS
  Filled 2018-07-26: qty 1

## 2018-07-26 MED ORDER — IOPAMIDOL (ISOVUE-370) INJECTION 76%
INTRAVENOUS | Status: AC
  Filled 2018-07-26: qty 100

## 2018-07-26 NOTE — ED Notes (Signed)
Patient verbalizes understanding of medications and discharge instructions. No further questions at this time. VSS and patient ambulatory at discharge.   

## 2018-07-26 NOTE — ED Triage Notes (Signed)
Pt with c/o CP centralized that started at 12p pt states pain is throbbing and under bilateral breast

## 2018-07-26 NOTE — Discharge Instructions (Addendum)
Return here as needed.  Follow-up with your primary doctor. °

## 2018-07-26 NOTE — ED Provider Notes (Signed)
[facility] EMERGENCY DEPARTMENT Provider Note   CSN: [id number] Arrival date & time: 07/26/18  0043    History   Chief Complaint Chief Complaint  Patient presents with  . Chest Pain    HPI Lindsey [name] is a 48 y.o. female.     HPI Patient presents to the emergency department with left-sided chest discomfort that started today around 12 PM.  Patient states that it is under her breast and it is a sharp throbbing type pain.  Patient states that she does have sickle cell disease but this is not totally similar with her previous issues.  The patient states she has had similar issues in the past.  Patient states that nothing seems make condition better but breathing out makes the pain worse.  The patient denies  shortness of breath, headache,blurred vision, neck pain, fever, cough, weakness, numbness, dizziness, anorexia, edema, abdominal pain, nausea, vomiting, diarrhea, rash, back pain, dysuria, hematemesis, bloody stool, near syncope, or syncope. Past Medical History:  Diagnosis Date  . 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 Opioid dependence in remission (HCC)   . Sickle cell crisis [facility])     Patient Active Problem List   Diagnosis Date Noted  . Opioid dependence in remission (HCC) 10/01/2017  . Sickle cell disease (HCC) 10/01/2017  . Chronic pain 10/01/2017  . 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 Leukocytosis 09/03/2016  . Sickle cell pain crisis (HCC) 07/20/2012  . 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 APPENDECTOMY    . CESAREAN SECTION    . OTHER SURGICAL HISTORY     c-section     OB History   No obstetric history on file.      Home Medications    Prior to Admission medications   Medication Sig Start Date End Date Taking? Authorizing Provider  gabapentin (NEURONTIN) 100 MG capsule Take 1 capsule (100 mg total) by mouth 3 (three) times daily. Take 1 BID x 2 weeks then  1 QHS there after. Take TID with crisis Patient taking differently: Take 100 mg by mouth 3 (three) times daily.  03/30/18  Yes Mike Gip, FNP  hydrOXYzine (ATARAX/VISTARIL) 25 MG tablet TAKE 1 TO 2 TABLETS(25 TO 50 MG) BY MOUTH AT BEDTIME Patient taking differently: Take 25-50 mg by mouth at bedtime.  07/09/18  Yes Mike Gip, FNP  ibuprofen (ADVIL,MOTRIN) 800 MG tablet Take 1 tablet (800 mg total) by mouth every 8 (eight) hours as needed for moderate pain. 03/30/18  Yes Mike Gip, FNP  ergocalciferol (DRISDOL) 50000 units capsule Take 1 capsule (50,000 Units total) by mouth once a week. Patient not taking: Reported on 07/26/2018 10/06/17   Massie Maroon, FNP    Family History History reviewed. No pertinent family history.  Social History Social History   Tobacco Use  . Smoking status: Current Some Day Smoker    Packs/day: 0.00    Types: Cigars  . Smokeless tobacco: Never Used  Substance Use Topics  . Alcohol use: Yes    Comment: occasionally  . Drug use: No     Allergies   Patient has no known allergies.   Review of Systems Review of Systems All other systems negative except as documented in the HPI. All pertinent positives and negatives as reviewed in the HPI.   Physical Exam Updated Vital Signs BP 113/78   Pulse 71   Resp 16   Ht  (1.727 m)  Wt 74.8 kg   SpO2 98%   BMI 25.09 kg/m   Physical Exam Vitals signs and nursing note reviewed.  Constitutional:      General: She is not in acute distress.    Appearance: She is well-developed.  HENT:     Head: Normocephalic and atraumatic.  Eyes:     Pupils: Pupils are equal, round, and reactive to light.  Neck:     Musculoskeletal: Normal range of motion and neck supple.  Cardiovascular:     Rate and Rhythm: Normal rate and regular rhythm.     Heart sounds: Normal heart sounds. No murmur. No friction rub. No gallop.   Pulmonary:     Effort: Pulmonary effort is normal. No respiratory distress.      Breath sounds: Normal breath sounds. No wheezing.  Abdominal:     General: Bowel sounds are normal. There is no distension.     Palpations: Abdomen is soft.     Tenderness: There is no abdominal tenderness.  Skin:    General: Skin is warm and dry.     Capillary Refill: Capillary refill takes less than 2 seconds.     Findings: No erythema or rash.  Neurological:     Mental Status: She is alert and oriented to person, place, and time.     Motor: No abnormal muscle tone.     Coordination: Coordination normal.  Psychiatric:        Behavior: Behavior normal.      ED Treatments / Results  Labs (all labs ordered are listed, but only abnormal results are displayed) Labs Reviewed  CBC WITH DIFFERENTIAL/PLATELET - Abnormal; Notable for the following components:      Result Value   WBC 14.5 (*)    RBC 3.14 (*)    Hemoglobin 9.8 (*)    HCT 26.6 (*)    MCHC 36.8 (*)    Platelets 520 (*)    nRBC 0.3 (*)    Lymphs Abs 6.5 (*)    All other components within normal limits  COMPREHENSIVE METABOLIC PANEL - Abnormal; Notable for the following components:   Alkaline Phosphatase 37 (*)    All other components within normal limits  D-DIMER, QUANTITATIVE (NOT AT [facility]) - Abnormal; Notable for the following components:   D-Dimer, Quant 0.70 (*)    All other components within normal limits  RETICULOCYTES - Abnormal; Notable for the following components:   Retic Ct Pct 4.5 (*)    RBC. 3.14 (*)    Immature Retic Fract 21.9 (*)    All other components within normal limits  TROPONIN I    EKG EKG Interpretation  Date/Time:  Thursday July 26 2018 00:50:37 EDT Ventricular Rate:  82 PR Interval:    QRS Duration: 83 QT Interval:  385 QTC Calculation: 450 R Axis:   66 Text Interpretation:  Sinus rhythm Prolonged PR interval Probable left atrial enlargement Low voltage, precordial leads Minimal ST elevation, inferior leads baseline wander, likely contributing to apparent elevation in  inferior leads Does not meet STEMI criteria Confirmed by Shaune Pollack [id number]) on 07/26/2018 12:53:11 AM   Radiology Dg Chest 2 View  Result Date: 07/26/2018 CLINICAL DATA:  Chest pain, sickle cell patient EXAM: CHEST - 2 VIEW COMPARISON:  09/03/2017 FINDINGS: Low lung volumes. Heart is upper limits normal in size. No confluent opacities or effusions. No acute bony abnormality. IMPRESSION: Low lung volumes.  No acute cardiopulmonary disease. Electronically Signed   By: Charlett Nose M.D.   On: 07/26/2018  01:57   Ct Angio Chest Pe W/cm &/or Wo Cm  Result Date: 07/26/2018 CLINICAL DATA:  Central chest pain. History of sickle cell crisis and opioid abuse. EXAM: CT ANGIOGRAPHY CHEST WITH CONTRAST TECHNIQUE: Multidetector CT imaging of the chest was performed using the standard protocol during bolus administration of intravenous contrast. Multiplanar CT image reconstructions and MIPs were obtained to evaluate the vascular anatomy. CONTRAST:  57mL ISOVUE-370 IOPAMIDOL (ISOVUE-370) INJECTION 76% COMPARISON:  Current chest radiograph. FINDINGS: Cardiovascular: There is satisfactory opacification of the pulmonary arteries to the segmental level. There is no evidence of a pulmonary embolism. Heart is mildly enlarged. No pericardial effusion. No coronary artery calcifications. Great vessels are normal in caliber. No aortic dissection or atherosclerosis. Mediastinum/Nodes: No enlarged mediastinal, hilar, or axillary lymph nodes. Thyroid gland, trachea, and esophagus demonstrate no significant findings. Lungs/Pleura: Peripheral cystic areas noted in the lower lobes associated with reticular and linear opacities, the latter consistent with scarring or atelectasis. There are several other areas of relative lucency in the lungs that are most likely due to air trapping and small airways disease. Lungs are otherwise clear. No pleural effusion or pneumothorax. Upper Abdomen: Small dense auto infarcted spleen. No acute  findings. Musculoskeletal: Biconcave vertebral endplates consistent with the given history of sickle cell disease. No fracture or acute finding. Right breast appears indented at the nipple. This is likely due to an overlying in group of wires. Review of the MIP images confirms the above findings. IMPRESSION: 1. No evidence of a pulmonary embolism. 2. Areas of relative lucency noted in the lungs consistent with small airways disease and air trapping. No evidence of pneumonia or pulmonary edema. 3. Mild cardiomegaly.  Skeletal changes of sickle cell disease. 4. Indented right nipple which is likely due to compression from an overlying group of wires. Recommend correlating clinically to exclude true nipple inversion. Electronically Signed   By: Amie Portland M.D.   On: 07/26/2018 03:19    Procedures Procedures (including critical care time)  Medications Ordered in ED Medications  iopamidol (ISOVUE-370) 76 % injection (has no administration in time range)  oxyCODONE-acetaminophen (PERCOCET/ROXICET) 5-325 MG per tablet 1 tablet (1 tablet Oral Refused 07/26/18 0440)  ketorolac (TORADOL) 30 MG/ML injection 30 mg (30 mg Intravenous Given 07/26/18 0204)  sodium chloride 0.9 % bolus 1,000 mL (0 mLs Intravenous Stopped 07/26/18 0316)  iopamidol (ISOVUE-370) 76 % injection 75 mL (75 mLs Intravenous Contrast Given 07/26/18 0306)  ketorolac (TORADOL) injection 30 mg (30 mg Intramuscular Given 07/26/18 0447)     Initial Impression / Assessment and Plan / ED Course  I have reviewed the triage vital signs and the nursing notes.  Pertinent labs & imaging results that were available during my care of the patient were reviewed by me and considered in my medical decision making (see chart for details).      Patient has no signs of pulmonary embolism on the CT scan.  The rest of her laboratory testing does not reveal any significant abnormality at this time.  Patient is advised to return here as needed.  She is feeling  better following medications here in the emergency department.   Final Clinical Impressions(s) / ED Diagnoses   Final diagnoses:  Chest pain    ED Discharge Orders    None       Charlestine Night, Cordelia Poche 07/27/18 Jalene Mullet, MD 07/28/18 1900

## 2018-09-10 ENCOUNTER — Other Ambulatory Visit: Payer: Self-pay | Admitting: Family Medicine

## 2018-09-10 DIAGNOSIS — G4700 Insomnia, unspecified: Secondary | ICD-10-CM

## 2018-10-15 ENCOUNTER — Other Ambulatory Visit: Payer: Self-pay | Admitting: Family Medicine

## 2018-10-15 DIAGNOSIS — D571 Sickle-cell disease without crisis: Secondary | ICD-10-CM

## 2018-11-12 ENCOUNTER — Other Ambulatory Visit: Payer: Self-pay | Admitting: Family Medicine

## 2018-11-12 DIAGNOSIS — G4700 Insomnia, unspecified: Secondary | ICD-10-CM

## 2018-11-20 ENCOUNTER — Telehealth (HOSPITAL_COMMUNITY): Payer: Self-pay | Admitting: *Deleted

## 2018-11-20 NOTE — Telephone Encounter (Signed)
Patient called requesting to come to the day hospital for sickle cell pain. Patient reports chest pain which is typical of her sickle cell crisis. RN advised patient that the clinic does not accept patient's after 1:00 pm because there would not be enough time to adequately treat patient before the clinic closed. Advised patient to report to the ED to evaluate chest pain. Patient stated that she did not want "to wait for hours in the Emergency room"  and patient hung up the phone.

## 2018-11-21 ENCOUNTER — Emergency Department (HOSPITAL_COMMUNITY)
Admission: EM | Admit: 2018-11-21 | Discharge: 2018-11-22 | Disposition: A | Discharge status: Home / Self Care | Payer: Medicaid Other | Source: Home / Self Care | Attending: Emergency Medicine | Admitting: Emergency Medicine

## 2018-11-21 ENCOUNTER — Other Ambulatory Visit: Payer: Self-pay

## 2018-11-21 ENCOUNTER — Encounter (HOSPITAL_COMMUNITY): Payer: Self-pay

## 2018-11-21 ENCOUNTER — Emergency Department (HOSPITAL_COMMUNITY): Payer: Medicaid Other

## 2018-11-21 ENCOUNTER — Encounter: Payer: Self-pay | Admitting: Family Medicine

## 2018-11-21 DIAGNOSIS — R079 Chest pain, unspecified: Secondary | ICD-10-CM

## 2018-11-21 DIAGNOSIS — D5700 Hb-SS disease with crisis, unspecified: Secondary | ICD-10-CM

## 2018-11-21 LAB — CBC WITH DIFFERENTIAL/PLATELET
Abs Immature Granulocytes: 0.04 10*3/uL (ref 0.00–0.07)
Basophils Absolute: 0.1 10*3/uL (ref 0.0–0.1)
Basophils Relative: 1 %
Eosinophils Absolute: 0.3 10*3/uL (ref 0.0–0.5)
Eosinophils Relative: 3 %
HCT: 27.8 % — ABNORMAL LOW (ref 36.0–46.0)
Hemoglobin: 10.5 g/dL — ABNORMAL LOW (ref 12.0–15.0)
Immature Granulocytes: 0 %
Lymphocytes Relative: 33 %
Lymphs Abs: 4.3 10*3/uL — ABNORMAL HIGH (ref 0.7–4.0)
MCH: 31.7 pg (ref 26.0–34.0)
MCHC: 37.8 g/dL — ABNORMAL HIGH (ref 30.0–36.0)
MCV: 84 fL (ref 80.0–100.0)
Monocytes Absolute: 0.8 10*3/uL (ref 0.1–1.0)
Monocytes Relative: 6 %
Neutro Abs: 7.6 10*3/uL (ref 1.7–7.7)
Neutrophils Relative %: 57 %
Platelets: 415 10*3/uL — ABNORMAL HIGH (ref 150–400)
RBC: 3.31 MIL/uL — ABNORMAL LOW (ref 3.87–5.11)
RDW: 14.3 % (ref 11.5–15.5)
WBC: 13.1 10*3/uL — ABNORMAL HIGH (ref 4.0–10.5)
nRBC: 0.3 % — ABNORMAL HIGH (ref 0.0–0.2)

## 2018-11-21 LAB — COMPREHENSIVE METABOLIC PANEL
ALT: 15 U/L (ref 0–44)
AST: 18 U/L (ref 15–41)
Albumin: 4.1 g/dL (ref 3.5–5.0)
Alkaline Phosphatase: 40 U/L (ref 38–126)
Anion gap: 8 (ref 5–15)
BUN: 15 mg/dL (ref 6–20)
CO2: 24 mmol/L (ref 22–32)
Calcium: 8.8 mg/dL — ABNORMAL LOW (ref 8.9–10.3)
Chloride: 111 mmol/L (ref 98–111)
Creatinine, Ser: 0.91 mg/dL (ref 0.44–1.00)
GFR calc Af Amer: >60 mL/min (ref >60)
GFR calc non Af Amer: >60 mL/min (ref >60)
Glucose, Bld: 91 mg/dL (ref 70–99)
Potassium: 4 mmol/L (ref 3.5–5.1)
Sodium: 143 mmol/L (ref 135–145)
Total Bilirubin: 1 mg/dL (ref 0.3–1.2)
Total Protein: 7 g/dL (ref 6.5–8.1)

## 2018-11-21 LAB — RETICULOCYTES
Immature Retic Fract: 21.9 % — ABNORMAL HIGH (ref 2.3–15.9)
RBC.: 3.31 MIL/uL — ABNORMAL LOW (ref 3.87–5.11)
Retic Count, Absolute: 105.6 10*3/uL (ref 19.0–186.0)
Retic Ct Pct: 3.2 % — ABNORMAL HIGH (ref 0.4–3.1)

## 2018-11-21 LAB — I-STAT BETA HCG BLOOD, ED (MC, WL, AP ONLY): I-stat hCG, quantitative: <5 m[IU]/mL (ref <5)

## 2018-11-21 MED ORDER — HYDROMORPHONE HCL 1 MG/ML IJ SOLN
0.5000 mg | INTRAMUSCULAR | Status: AC
  Administered 2018-11-21: 0.5 mg via INTRAVENOUS
  Filled 2018-11-21: qty 1

## 2018-11-21 MED ORDER — HYDROMORPHONE HCL 1 MG/ML IJ SOLN: 1.0000 mg | INTRAMUSCULAR | Status: DC

## 2018-11-21 MED ORDER — SODIUM CHLORIDE 0.9% FLUSH: 3.0000 mL | Freq: Once | INTRAVENOUS | Status: DC

## 2018-11-21 MED ORDER — HYDROMORPHONE HCL 1 MG/ML IJ SOLN
1.0000 mg | INTRAMUSCULAR | Status: AC
  Administered 2018-11-21: 1 mg via INTRAVENOUS
  Filled 2018-11-21: qty 1

## 2018-11-21 MED ORDER — HYDROMORPHONE HCL 1 MG/ML IJ SOLN: 1.0000 mg | INTRAMUSCULAR | Status: AC

## 2018-11-21 MED ORDER — DEXTROSE-NACL 5-0.45 % IV SOLN
INTRAVENOUS | Status: DC
  Administered 2018-11-21: 23:00:00 via INTRAVENOUS

## 2018-11-21 MED ORDER — KETOROLAC TROMETHAMINE 30 MG/ML IJ SOLN
30.0000 mg | INTRAMUSCULAR | Status: AC
  Administered 2018-11-21: 30 mg via INTRAVENOUS
  Filled 2018-11-21: qty 1

## 2018-11-21 MED ORDER — HYDROMORPHONE HCL 1 MG/ML IJ SOLN: 0.5000 mg | INTRAMUSCULAR | Status: AC

## 2018-11-21 MED ORDER — HYDROMORPHONE HCL 1 MG/ML IJ SOLN
1.0000 mg | INTRAMUSCULAR | Status: AC
  Administered 2018-11-22: 1 mg via INTRAVENOUS
  Filled 2018-11-21: qty 1

## 2018-11-21 NOTE — ED Triage Notes (Signed)
Pt arrived stating that she went on a walk yesterday morning and began having central chest pain believed to be related to a sickle cell pain crisis. Pt states she took her home medication roughly 30 minutes ago, with no relief.

## 2018-11-21 NOTE — ED Provider Notes (Signed)
[facility]-EMERGENCY DEPT Provider Note   CSN: [id number] Arrival date & time: 11/21/18  2100     History   Chief Complaint Chief Complaint  Patient presents with  . Sickle Cell Pain Crisis    HPI Lindsey [name] is a 48 y.o. female.     The history is provided by the patient and medical records. No language interpreter was used.  Sickle Cell Pain Crisis Location:  Chest Severity:  Severe Onset quality:  Gradual Duration:  2 days Similar to previous crisis episodes: yes   Timing:  Constant Progression:  Unchanged Chronicity:  Recurrent Relieved by:  Nothing Worsened by:  Nothing Ineffective treatments:  None tried Associated symptoms: chest pain   Associated symptoms: no congestion, no cough, no fatigue, no fever, no headaches, no nausea, no shortness of breath, no vomiting and no wheezing   Risk factors: no frequent admissions for fever, no frequent admissions for pain, no frequent pain crises, no hx of pneumonia, no hx of stroke and no prior acute chest     Past Medical History:  Diagnosis Date  . 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 Opioid dependence in remission (HCC)   . Sickle cell crisis [facility](HCC)     Patient Active Problem List   Diagnosis Date Noted  . Opioid dependence in remission (HCC) 10/01/2017  . Sickle cell disease (HCC) 10/01/2017  . Chronic pain 10/01/2017  . 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 Leukocytosis 09/03/2016  . Sickle cell pain crisis (HCC) 07/20/2012  . 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 APPENDECTOMY    . CESAREAN SECTION    . OTHER SURGICAL HISTORY     c-section     OB History   No obstetric history on file.      Home Medications    Prior to Admission medications   Medication Sig Start Date End Date Taking? Authorizing Provider  gabapentin (NEURONTIN) 100 MG capsule Take 1 capsule (100 mg total) by mouth 3 (three) times  daily. 10/16/18  Yes Mike Gipouglas, Andre, FNP  hydrOXYzine (ATARAX/VISTARIL) 25 MG tablet TAKE 1 TO 2 TABLETS(25 TO 50 MG) BY MOUTH AT BEDTIME Patient taking differently: Take 25-50 mg by mouth at bedtime.  11/13/18  Yes Mike Gipouglas, Andre, FNP  ibuprofen (ADVIL,MOTRIN) 800 MG tablet Take 1 tablet (800 mg total) by mouth every 8 (eight) hours as needed for moderate pain. 03/30/18  Yes Mike Gipouglas, Andre, FNP  ergocalciferol (DRISDOL) 50000 units capsule Take 1 capsule (50,000 Units total) by mouth once a week. Patient not taking: Reported on 07/26/2018 10/06/17   Massie MaroonHollis, Lachina M, FNP  hydrOXYzine (ATARAX/VISTARIL) 25 MG tablet TAKE 1 TO 2 TABLETS(25 TO 50 MG) BY MOUTH AT BEDTIME Patient not taking: Reported on 11/21/2018 11/13/18   Mike Gipouglas, Andre, FNP    Family History No family history on file.  Social History Social History   Tobacco Use  . Smoking status: Current Some Day Smoker    Packs/day: 0.00    Types: Cigars  . Smokeless tobacco: Never Used  Substance Use Topics  . Alcohol use: Yes    Comment: occasionally  . Drug use: No     Allergies   Patient has no known allergies.   Review of Systems Review of Systems  Constitutional: Negative for chills, diaphoresis, fatigue and fever.  HENT: Negative for congestion.   Respiratory: Negative for cough, chest tightness, shortness of breath and wheezing.   Cardiovascular:  Positive for chest pain. Negative for palpitations and leg swelling.  Gastrointestinal: Negative for constipation, diarrhea, nausea and vomiting.  Genitourinary: Negative for flank pain.  Musculoskeletal: Negative for back pain, neck pain and neck stiffness.  Skin: Negative for rash and wound.  Neurological: Negative for light-headedness and headaches.  Psychiatric/Behavioral: Negative for agitation.  All other systems reviewed and are negative.    Physical Exam Updated Vital Signs BP 128/81 (BP Location: Left Arm)   Pulse 72   Temp 98.5 F (36.9 C) (Oral)   Resp 20    LMP 11/09/2018   SpO2 98%   Physical Exam Vitals signs and nursing note reviewed.  Constitutional:      General: She is not in acute distress.    Appearance: She is well-developed. She is not ill-appearing, toxic-appearing or diaphoretic.  HENT:     Head: Normocephalic and atraumatic.  Eyes:     Conjunctiva/sclera: Conjunctivae normal.  Neck:     Musculoskeletal: Neck supple. No muscular tenderness.  Cardiovascular:     Rate and Rhythm: Normal rate and regular rhythm.     Pulses: Normal pulses.     Heart sounds: No murmur.  Pulmonary:     Effort: Pulmonary effort is normal. No respiratory distress.     Breath sounds: Normal breath sounds. No wheezing, rhonchi or rales.  Chest:     Chest wall: Tenderness present.  Abdominal:     General: There is no distension.     Palpations: Abdomen is soft.     Tenderness: There is no abdominal tenderness. There is no right CVA tenderness or left CVA tenderness.  Musculoskeletal:        General: No tenderness.  Skin:    General: Skin is warm and dry.     Capillary Refill: Capillary refill takes less than 2 seconds.     Findings: No erythema.  Neurological:     General: No focal deficit present.     Mental Status: She is alert and oriented to person, place, and time.     Sensory: No sensory deficit.     Motor: No weakness.  Psychiatric:        Mood and Affect: Mood normal.      ED Treatments / Results  Labs (all labs ordered are listed, but only abnormal results are displayed) Labs Reviewed  COMPREHENSIVE METABOLIC PANEL - Abnormal; Notable for the following components:      Result Value   Calcium 8.8 (*)    All other components within normal limits  CBC WITH DIFFERENTIAL/PLATELET - Abnormal; Notable for the following components:   WBC 13.1 (*)    RBC 3.31 (*)    Hemoglobin 10.5 (*)    HCT 27.8 (*)    MCHC 37.8 (*)    Platelets 415 (*)    nRBC 0.3 (*)    Lymphs Abs 4.3 (*)    All other components within normal limits   RETICULOCYTES - Abnormal; Notable for the following components:   Retic Ct Pct 3.2 (*)    RBC. 3.31 (*)    Immature Retic Fract 21.9 (*)    All other components within normal limits  URINE CULTURE  URINALYSIS, ROUTINE W REFLEX MICROSCOPIC  I-STAT BETA HCG BLOOD, ED ([facility], [facility], [facility] ONLY)    EKG EKG Interpretation  Date/Time:  Wednesday November 21 2018 21:14:21 EDT Ventricular Rate:  75 PR Interval:    QRS Duration: 97 QT Interval:  417 QTC Calculation: 469 R Axis:   67 Text Interpretation:  Sinus rhythm Low voltage, extremity and precordial leads When compared to prior, no significant changes seen.  NO STEMI Confirmed by Theda Belfastegeler, Chris ([id number]) on 11/21/2018 10:07:52 PM   Radiology Dg Chest Port 1 View  Result Date: 11/21/2018 CLINICAL DATA:  Sickle cell, chest pain EXAM: PORTABLE CHEST 1 VIEW COMPARISON:  07/26/2018 FINDINGS: Linear scarring or atelectasis at the left base. No acute consolidation or effusion. Stable cardiomediastinal silhouette. No pneumothorax. IMPRESSION: No active disease. Electronically Signed   By: Jasmine PangKim  Fujinaga M.D.   On: 11/21/2018 23:01    Procedures Procedures (including critical care time)  Medications Ordered in ED Medications  sodium chloride flush (NS) 0.9 % injection 3 mL (has no administration in time range)  dextrose 5 %-0.45 % sodium chloride infusion ( Intravenous New Bag/Given 11/21/18 2308)  HYDROmorphone (DILAUDID) injection 1 mg (has no administration in time range)    Or  HYDROmorphone (DILAUDID) injection 1 mg (has no administration in time range)  HYDROmorphone (DILAUDID) injection 1 mg (has no administration in time range)    Or  HYDROmorphone (DILAUDID) injection 1 mg (has no administration in time range)  ketorolac (TORADOL) 30 MG/ML injection 30 mg (30 mg Intravenous Given 11/21/18 2301)  HYDROmorphone (DILAUDID) injection 0.5 mg (0.5 mg Intravenous Given 11/21/18 2302)    Or  HYDROmorphone (DILAUDID) injection 0.5 mg ( Subcutaneous See  Alternative 11/21/18 2302)  HYDROmorphone (DILAUDID) injection 1 mg (1 mg Intravenous Given 11/21/18 2353)    Or  HYDROmorphone (DILAUDID) injection 1 mg ( Subcutaneous See Alternative 11/21/18 2353)     Initial Impression / Assessment and Plan / ED Course  I have reviewed the triage vital signs and the nursing notes.  Pertinent labs & imaging results that were available during my care of the patient were reviewed by me and considered in my medical decision making (see chart for details).        Lindsey [name] is a 48 y.o. female with a past medical history significant for sickle cell anemia who presents with pain.  Patient reports that for the last 2 days she has been having chest pain consistent with prior sickle cell pain crisis.  She reports she has not had acute chest syndrome or pneumonia in the past.  She denies any history of trauma.  She denies fevers, chills, cough, or shortness of breath.  She denies any urinary symptoms or GI symptoms.  She reports that she has taken home pain medicine without significant relief.  She reports she has not had to come to the emergency department in many months and has done very well.  She denies any history of DVT or PE and denies any leg pain or leg swelling.  She denies other complaints.  She suspect this is a typical pain crisis causing some chest pain for her.  On exam, chest is slightly tender to palpation, lungs clear.  Back nontender.  Abdomen nontender.  No leg tenderness or leg swelling.  Normal pulses.  Patient had no focal neurologic deficits and is otherwise well-appearing.  Shared decision made conversation with patient and we agreed to get blood work and chest x-ray and give her fluids and pain medication.  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  She agrees to take Toradol and IV Dilaudid.  Patient thinks that she will be able to go home if her pain improves.  On initial  evaluation, lower suspicion for acute chest syndrome or  pneumonia.  Lower suspicion for a cardiac or pulmonary cause for symptoms.  Patient insists this feels like prior pain crisis.  Care transferred to Dr. Nicanor AlconPalumbo while awaiting for diagnostic work-up and reassessment.  Anticipate discharge if symptoms improve.   Final Clinical Impressions(s) / ED Diagnoses   Final diagnoses:  Chest pain, unspecified type    Clinical Impression: 1. Chest pain, unspecified type     Disposition: Care transferred while awaiting reassessment and sickle cell work-up.  Anticipate discharge if symptoms improve and work-up is reassuring.  This note was prepared with assistance of Conservation officer, historic buildingsDragon voice recognition software. Occasional wrong-word or sound-a-like substitutions may have occurred due to the inherent limitations of voice recognition software.      Tegeler, Canary Brimhristopher J, MD 11/22/18 [phone]

## 2018-11-21 NOTE — Progress Notes (Signed)
Patient called on call provider line. She states that she is in severe pain. Called day hospital yesterday and instructed to go to the ED for further evaluation. She denied going to ED. This provider instructed her to go to the ED for further evaluation. She has not been seen by this provider since November 2019. Patient states that she doesn't want to go to the ED and ended the call.

## 2018-11-22 ENCOUNTER — Inpatient Hospital Stay (HOSPITAL_COMMUNITY)
Admission: AD | Admit: 2018-11-22 | Discharge: 2018-11-26 | DRG: 812 | Disposition: A | Discharge status: Home / Self Care | Payer: Medicaid Other | Source: Ambulatory Visit | Attending: Internal Medicine | Admitting: Internal Medicine

## 2018-11-22 ENCOUNTER — Telehealth (HOSPITAL_COMMUNITY): Payer: Self-pay | Admitting: General Practice

## 2018-11-22 ENCOUNTER — Encounter (HOSPITAL_COMMUNITY): Payer: Self-pay

## 2018-11-22 DIAGNOSIS — D5700 Hb-SS disease with crisis, unspecified: Secondary | ICD-10-CM | POA: Diagnosis not present

## 2018-11-22 DIAGNOSIS — D57219 Sickle-cell/Hb-C disease with crisis, unspecified: Principal | ICD-10-CM | POA: Diagnosis present

## 2018-11-22 DIAGNOSIS — F1121 Opioid dependence, in remission: Secondary | ICD-10-CM | POA: Diagnosis present

## 2018-11-22 DIAGNOSIS — Z79899 Other long term (current) drug therapy: Secondary | ICD-10-CM

## 2018-11-22 DIAGNOSIS — D72829 Elevated white blood cell count, unspecified: Secondary | ICD-10-CM

## 2018-11-22 DIAGNOSIS — G894 Chronic pain syndrome: Secondary | ICD-10-CM | POA: Diagnosis present

## 2018-11-22 DIAGNOSIS — Z20828 Contact with and (suspected) exposure to other viral communicable diseases: Secondary | ICD-10-CM | POA: Diagnosis present

## 2018-11-22 DIAGNOSIS — D638 Anemia in other chronic diseases classified elsewhere: Secondary | ICD-10-CM | POA: Diagnosis present

## 2018-11-22 DIAGNOSIS — D571 Sickle-cell disease without crisis: Secondary | ICD-10-CM | POA: Diagnosis present

## 2018-11-22 DIAGNOSIS — F17290 Nicotine dependence, other tobacco product, uncomplicated: Secondary | ICD-10-CM | POA: Diagnosis present

## 2018-11-22 LAB — CREATININE, SERUM
Creatinine, Ser: 0.87 mg/dL (ref 0.44–1.00)
GFR calc Af Amer: >60 mL/min (ref >60)
GFR calc non Af Amer: >60 mL/min (ref >60)

## 2018-11-22 LAB — CBC
HCT: 25.2 % — ABNORMAL LOW (ref 36.0–46.0)
Hemoglobin: 9.3 g/dL — ABNORMAL LOW (ref 12.0–15.0)
MCH: 31.3 pg (ref 26.0–34.0)
MCHC: 36.9 g/dL — ABNORMAL HIGH (ref 30.0–36.0)
MCV: 84.8 fL (ref 80.0–100.0)
Platelets: 338 10*3/uL (ref 150–400)
RBC: 2.97 MIL/uL — ABNORMAL LOW (ref 3.87–5.11)
RDW: 14.6 % (ref 11.5–15.5)
WBC: 13.8 10*3/uL — ABNORMAL HIGH (ref 4.0–10.5)
nRBC: 0.3 % — ABNORMAL HIGH (ref 0.0–0.2)

## 2018-11-22 MED ORDER — GABAPENTIN 100 MG PO CAPS
100.0000 mg | ORAL_CAPSULE | Freq: Three times a day (TID) | ORAL | Status: DC
  Administered 2018-11-22 – 2018-11-26 (×12): 100 mg via ORAL
  Filled 2018-11-22 (×12): qty 1

## 2018-11-22 MED ORDER — DIPHENHYDRAMINE HCL 12.5 MG/5ML PO ELIX: 12.5000 mg | ORAL_SOLUTION | Freq: Four times a day (QID) | ORAL | Status: DC | PRN

## 2018-11-22 MED ORDER — KETOROLAC TROMETHAMINE 30 MG/ML IJ SOLN: 15.0000 mg | Freq: Once | INTRAMUSCULAR | Status: DC

## 2018-11-22 MED ORDER — HYDROXYZINE HCL 25 MG PO TABS
25.0000 mg | ORAL_TABLET | Freq: Every day | ORAL | Status: DC
  Administered 2018-11-22 – 2018-11-25 (×4): 50 mg via ORAL
  Filled 2018-11-22 (×4): qty 2

## 2018-11-22 MED ORDER — ONDANSETRON HCL 4 MG/2ML IJ SOLN: 4.0000 mg | Freq: Four times a day (QID) | INTRAMUSCULAR | Status: DC | PRN

## 2018-11-22 MED ORDER — ENOXAPARIN SODIUM 40 MG/0.4ML ~~LOC~~ SOLN
40.0000 mg | SUBCUTANEOUS | Status: DC
  Administered 2018-11-22 – 2018-11-25 (×4): 40 mg via SUBCUTANEOUS
  Filled 2018-11-22 (×4): qty 0.4

## 2018-11-22 MED ORDER — NALOXONE HCL 0.4 MG/ML IJ SOLN: 0.4000 mg | INTRAMUSCULAR | Status: DC | PRN

## 2018-11-22 MED ORDER — POLYETHYLENE GLYCOL 3350 17 G PO PACK
17.0000 g | PACK | Freq: Every day | ORAL | Status: DC | PRN
  Administered 2018-11-25: 17 g via ORAL

## 2018-11-22 MED ORDER — SENNOSIDES-DOCUSATE SODIUM 8.6-50 MG PO TABS
1.0000 | ORAL_TABLET | Freq: Two times a day (BID) | ORAL | Status: DC
  Administered 2018-11-22 – 2018-11-26 (×8): 1 via ORAL
  Filled 2018-11-22 (×8): qty 1

## 2018-11-22 MED ORDER — OXYCODONE HCL 5 MG PO TABS
5.0000 mg | ORAL_TABLET | Freq: Once | ORAL | Status: AC
  Administered 2018-11-22: 5 mg via ORAL
  Filled 2018-11-22: qty 1

## 2018-11-22 MED ORDER — HYDROMORPHONE 1 MG/ML IV SOLN
INTRAVENOUS | Status: DC
  Administered 2018-11-22: 0.3 mg via INTRAVENOUS
  Administered 2018-11-22: 1.8 mg via INTRAVENOUS
  Administered 2018-11-22: 3.2 mg via INTRAVENOUS
  Administered 2018-11-22: 30 mg via INTRAVENOUS
  Administered 2018-11-23: 0.9 mg via INTRAVENOUS
  Administered 2018-11-23: 1.5 mg via INTRAVENOUS
  Administered 2018-11-23: 0.6 mg via INTRAVENOUS
  Administered 2018-11-23: 0.9 mg via INTRAVENOUS
  Administered 2018-11-23: 2.1 mg via INTRAVENOUS
  Administered 2018-11-23: 1.8 mg via INTRAVENOUS
  Administered 2018-11-24: 1.2 mg via INTRAVENOUS
  Administered 2018-11-24: 0.6 mg via INTRAVENOUS
  Administered 2018-11-24: 0.3 mg via INTRAVENOUS
  Administered 2018-11-24: 0 mg via INTRAVENOUS
  Administered 2018-11-24: 0.6 mg via INTRAVENOUS
  Administered 2018-11-24 (×2): 0.3 mg via INTRAVENOUS
  Administered 2018-11-25: 0 mg via INTRAVENOUS
  Administered 2018-11-25: 0.6 mg via INTRAVENOUS
  Administered 2018-11-25: 0.9 mg via INTRAVENOUS
  Administered 2018-11-25 (×2): 0.3 mg via INTRAVENOUS
  Administered 2018-11-26 (×3): 0 mg via INTRAVENOUS
  Administered 2018-11-26: 0.3 mg via INTRAVENOUS
  Filled 2018-11-22: qty 30

## 2018-11-22 MED ORDER — SODIUM CHLORIDE 0.45 % IV SOLN
INTRAVENOUS | Status: DC
  Administered 2018-11-22 – 2018-11-25 (×3): via INTRAVENOUS

## 2018-11-22 MED ORDER — KETOROLAC TROMETHAMINE 15 MG/ML IJ SOLN
15.0000 mg | Freq: Four times a day (QID) | INTRAMUSCULAR | Status: DC
  Administered 2018-11-22 – 2018-11-26 (×15): 15 mg via INTRAVENOUS
  Filled 2018-11-22 (×15): qty 1

## 2018-11-22 MED ORDER — SODIUM CHLORIDE 0.9% FLUSH: 9.0000 mL | INTRAVENOUS | Status: DC | PRN

## 2018-11-22 MED ORDER — ACETAMINOPHEN 500 MG PO TABS
1000.0000 mg | ORAL_TABLET | Freq: Once | ORAL | Status: AC
  Administered 2018-11-22: 1000 mg via ORAL
  Filled 2018-11-22: qty 2

## 2018-11-22 NOTE — Telephone Encounter (Signed)
Patient called, complained of pain in the left arm rated at 7/10. Denied chest pain, fever, diarrhea, abdominal pain, nausea/vomitting. Screened negative for Covid-19 symptoms. Admitted to having means of transportation without driving self after treatment. Last took 800 mg of Ibuprofen at 06:00 today. Per provider, patient can come to the day hospital for treatment. Patient notified, verbalized understanding.

## 2018-11-22 NOTE — H&P (Signed)
[facility] History and Physical   Date: 11/22/2018  Patient name: Lindsey [name] Medical record number: [id number] Date of birth: [birth year] Age: 48 y.o. Gender: female PCP: Quentin AngstJegede, Olugbemiga E, MD  Attending physician: Quentin AngstJegede, Olugbemiga E, MD  Chief Complaint: Sickle cell pain  History of Present Illness: Lindsey [name], a 48 year old female with a medical history significant for sickle cell disease, type [place] presents complaining of upper extremity pain that is consistent with previous sickle cell pain crisis. Patient says that pain intensity increased 3 days ago following a 4 mile walk outdoors. She was awakened with chest pains the following day and reported to the ER on 11/21/2018 for evaluation. Chest xray showed no acute cardiopulmonary process and EKG was unremarkable. Patient was treated with IV pain medications in ER and pain was managed at discount. She says that she developed upper extremity pain, primarily right arm shortly following discharge. Pain intensity is 10/10 characterized as constant and throbbing. Patient is opiate naive and last had Ibuprofen and gabapentin this am without sustained relief.  She denies fever, chills, sick contacts, or exposure to COVID 19. Patient also denies headache, shortness of breath, dizziness, paresthesias, dysuria, nausea, vomiting, or diarrhea.  Meds: Medications Prior to Admission  Medication Sig Dispense Refill Last Dose  . ergocalciferol (DRISDOL) 50000 units capsule Take 1 capsule (50,000 Units total) by mouth once a week. (Patient not taking: Reported on 07/26/2018) 30 capsule 1   . gabapentin (NEURONTIN) 100 MG capsule Take 1 capsule (100 mg total) by mouth 3 (three) times daily. 90 capsule 1   . hydrOXYzine (ATARAX/VISTARIL) 25 MG tablet TAKE 1 TO 2 TABLETS(25 TO 50 MG) BY MOUTH AT BEDTIME (Patient taking differently: Take 25-50 mg by mouth at bedtime. ) 60 tablet 2   . hydrOXYzine (ATARAX/VISTARIL) 25 MG tablet TAKE 1 TO 2  TABLETS(25 TO 50 MG) BY MOUTH AT BEDTIME (Patient not taking: Reported on 11/21/2018) 60 tablet 2   . ibuprofen (ADVIL,MOTRIN) 800 MG tablet Take 1 tablet (800 mg total) by mouth every 8 (eight) hours as needed for moderate pain. 60 tablet 5     Allergies: Patient has no known allergies. Past Medical History:  Diagnosis Date  . 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 Opioid dependence in remission (HCC)   . Sickle cell crisis [facility](HCC)    Past Surgical History:  Procedure Laterality Date  . APPENDECTOMY    . CESAREAN SECTION    . OTHER SURGICAL HISTORY     c-section   No family history on file. Social History   Socioeconomic History  . Marital status: Married    Spouse name: Not on file  . Number of children: Not on file  . Years of education: Not on file  . Highest education level: Not on file  Occupational History  . Not on file  Social Needs  . Financial resource strain: Not on file  . Food insecurity    Worry: Not on file    Inability: Not on file  . Transportation needs    Medical: Not on file    Non-medical: Not on file  Tobacco Use  . Smoking status: Current Some Day Smoker    Packs/day: 0.00    Types: Cigars  . Smokeless tobacco: Never Used  Substance and Sexual Activity  . Alcohol use: Yes    Comment: occasionally  . Drug use: No  . Sexual activity: Not on file  Lifestyle  . Physical activity    Days  per week: Not on file    Minutes per session: Not on file  . Stress: Not on file  Relationships  . Social Herbalist on phone: Not on file    Gets together: Not on file    Attends religious service: Not on file    Active member of club or organization: Not on file    Attends meetings of clubs or organizations: Not on file    Relationship status: Not on file  . Intimate partner violence    Fear of current or ex partner: Not on file    Emotionally abused: Not on file    Physically abused: Not on file    Forced sexual activity: Not on file   Other Topics Concern  . Not on file  Social History Narrative  . Not on file   Review of Systems  Constitutional: Negative for chills and fever.  HENT: Negative for sore throat.   Eyes: Negative.   Respiratory: Negative for shortness of breath.   Cardiovascular: Negative for chest pain.  Gastrointestinal: Negative for constipation, nausea and vomiting.  Genitourinary: Negative.   Musculoskeletal: Positive for joint pain.  Skin: Negative.   Neurological: Negative.  Negative for dizziness and headaches.  Endo/Heme/Allergies: Negative.   Psychiatric/Behavioral: Negative.  Negative for substance abuse.     Physical Exam: Last menstrual period 11/09/2018. Physical Exam Constitutional:      Appearance: Normal appearance.  HENT:     Head: Normocephalic.     Mouth/Throat:     Mouth: Mucous membranes are moist.  Eyes:     Pupils: Pupils are equal, round, and reactive to light.  Cardiovascular:     Rate and Rhythm: Normal rate and regular rhythm.  Pulmonary:     Effort: Pulmonary effort is normal.  Abdominal:     General: Bowel sounds are normal. There is no distension.     Tenderness: There is no abdominal tenderness.  Musculoskeletal: Normal range of motion.     Right elbow: Tenderness found.  Skin:    General: Skin is warm and dry.  Neurological:     General: No focal deficit present.     Mental Status: She is alert. Mental status is at baseline.  Psychiatric:        Mood and Affect: Mood normal. Affect is tearful.        Thought Content: Thought content normal.        Judgment: Judgment normal.      Lab results: Results for orders placed or performed during the hospital encounter of 11/21/18 (from the past 24 hour(s))  Comprehensive metabolic panel     Status: Abnormal   Collection Time: 11/21/18 11:10 PM  Result Value Ref Range   Sodium 143 135 - 145 mmol/L   Potassium 4.0 3.5 - 5.1 mmol/L   Chloride 111 98 - 111 mmol/L   CO2 24 22 - 32 mmol/L   Glucose,  Bld 91 70 - 99 mg/dL   BUN 15 6 - 20 mg/dL   Creatinine, Ser 0.91 0.44 - 1.00 mg/dL   Calcium 8.8 (L) 8.9 - 10.3 mg/dL   Total Protein 7.0 6.5 - 8.1 g/dL   Albumin 4.1 3.5 - 5.0 g/dL   AST 18 15 - 41 U/L   ALT 15 0 - 44 U/L   Alkaline Phosphatase 40 38 - 126 U/L   Total Bilirubin 1.0 0.3 - 1.2 mg/dL   GFR calc non Af Amer >60 >60 mL/min   GFR  calc Af Amer >60 >60 mL/min   Anion gap 8 5 - 15  CBC with Differential     Status: Abnormal   Collection Time: 11/21/18 11:10 PM  Result Value Ref Range   WBC 13.1 (H) 4.0 - 10.5 K/uL   RBC 3.31 (L) 3.87 - 5.11 MIL/uL   Hemoglobin 10.5 (L) 12.0 - 15.0 g/dL   HCT 04.527.8 (L) 40.936.0 - 81.146.0 %   MCV 84.0 80.0 - 100.0 fL   MCH 31.7 26.0 - 34.0 pg   MCHC 37.8 (H) 30.0 - 36.0 g/dL   RDW 91.414.3 78.211.5 - 95.615.5 %   Platelets 415 (H) 150 - 400 K/uL   nRBC 0.3 (H) 0.0 - 0.2 %   Neutrophils Relative % 57 %   Neutro Abs 7.6 1.7 - 7.7 K/uL   Lymphocytes Relative 33 %   Lymphs Abs 4.3 (H) 0.7 - 4.0 K/uL   Monocytes Relative 6 %   Monocytes Absolute 0.8 0.1 - 1.0 K/uL   Eosinophils Relative 3 %   Eosinophils Absolute 0.3 0.0 - 0.5 K/uL   Basophils Relative 1 %   Basophils Absolute 0.1 0.0 - 0.1 K/uL   Immature Granulocytes 0 %   Abs Immature Granulocytes 0.04 0.00 - 0.07 K/uL   Polychromasia PRESENT    Sickle Cells PRESENT    Target Cells PRESENT   Reticulocytes     Status: Abnormal   Collection Time: 11/21/18 11:10 PM  Result Value Ref Range   Retic Ct Pct 3.2 (H) 0.4 - 3.1 %   RBC. 3.31 (L) 3.87 - 5.11 MIL/uL   Retic Count, Absolute 105.6 19.0 - 186.0 K/uL   Immature Retic Fract 21.9 (H) 2.3 - 15.9 %  I-Stat beta hCG blood, ED     Status: None   Collection Time: 11/21/18 11:29 PM  Result Value Ref Range   I-stat hCG, quantitative <5.0 <5 mIU/mL   Comment 3            Imaging results:  Dg Chest Port 1 View  Result Date: 11/21/2018 CLINICAL DATA:  Sickle cell, chest pain EXAM: PORTABLE CHEST 1 VIEW COMPARISON:  07/26/2018 FINDINGS: Linear  scarring or atelectasis at the left base. No acute consolidation or effusion. Stable cardiomediastinal silhouette. No pneumothorax. IMPRESSION: No active disease. Electronically Signed   By: Jasmine PangKim  Fujinaga M.D.   On: 11/21/2018 23:01     Assessment & Plan: Patient admitted to sickle cell day infusion center for management of pain crisis.  Patient is mostly opiate naive. Initiate full dose PCA dilaudid.  Oxycodone 5 mg every 4 hours as needed for breakthrough pain IV fluids, D5.45% saline at 100 ml/hr Reviewed labs from 11/21/2018, unremarkable. Do not warrant repeating on today.  Pain will be reevaluated frequently in terms of functioning and relationship to baseline as her care progresses.  If pain intensity remains elevated, transfer to inpatient services for higher level of care.    Nolon NationsLachina Moore Jamell Opfer  APRN, MSN, FNP-C Patient Care [facility] [address]  [place], [place] [address] [phone]  11/22/2018, 9:37 AM

## 2018-11-22 NOTE — Progress Notes (Signed)
Patient admitted to the day hospital for sickle cell pain crisis. Initially, patient reported left arm pain rated 10/10. For pain management, patient placed on Dilaudid PCA, given 1000 mg Tylenol, 5 mg Oxycodone and hydrated with IV fluids. Patient's pain remained elevated and provider wrote order for admission to inpatient bed. Report called to [facility] [place]. Patient transferred to [facility] in wheelchair on PCA. Vital signs wnl. Patient's pain level down to 7/10. Patient alert, oriented and stable at transfer.

## 2018-11-22 NOTE — H&P (Signed)
H&P  Patient Demographics:  Lindsey [name], is a 48 y.o. female  MRN: [id number]   DOB - July 06, 1970  Admit Date - 11/22/2018  Outpatient Primary MD for the patient is Quentin AngstJegede, Olugbemiga E, MD     HPI:   Lindsey [name]  is a 48 y.o. female with a medical history significant for sickle cell disease, type [place], chronic pain syndrome, history of anemia of chronic disease, and history of opiate dependence in remission presented to sickle cell day infusion center complaining of upper extremity pain.  Patient states that pain intensity increased 3 days ago following a 4 mile walk outdoors.  She was awakened by chest pains the following day and reported to the ER for evaluation.  Chest x-ray showed no acute cardiopulmonary process and EKG was unremarkable.  Patient was treated with IV pain medications in ER and pain was managed at discharge.  She says that she was awakened by upper extremity pain, primarily right shortly following discharge.  Pain intensity on admission 10/10 characterized as constant and throbbing.  Patient has a history of opiate dependence and chronic opiate use, patient states that [place].  Patient primarily takes ibuprofen and gabapentin for pain management, she last had medications this a.m. without success. Patient denies fever, chills, sick contacts or exposure to COVID-19.  Patient also denies headache, shortness breath, dizziness, paresthesias, dysuria, nausea, vomiting, or diarrhea.    Review of systems:  Review of Systems  Constitutional: Negative for chills and weight loss.  HENT: Negative.   Eyes: Negative.   Respiratory: Negative.  Negative for cough, hemoptysis and wheezing.   Cardiovascular: Negative for chest pain and palpitations.  Gastrointestinal: Negative.  Negative for nausea and vomiting.  Genitourinary: Negative for dysuria and urgency.  Musculoskeletal: Positive for joint pain.  Skin: Negative.  Negative  for itching and rash.  Psychiatric/Behavioral: Negative.  Negative for depression and suicidal ideas.    A full 10 point Review of Systems was done, except as stated above, all other Review of Systems were negative.  With Past History of the following :   Past Medical History:  Diagnosis Date  . 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 Opioid dependence in remission (HCC)   . Sickle cell crisis [facility](HCC)       Past Surgical History:  Procedure Laterality Date  . APPENDECTOMY    . CESAREAN SECTION    . OTHER SURGICAL HISTORY     c-section     Social History:   Social History   Tobacco Use  . Smoking status: Current Some Day Smoker    Packs/day: 0.00    Types: Cigars  . Smokeless tobacco: Never Used  Substance Use Topics  . Alcohol use: Yes    Comment: occasionally     Lives - At home   Family History :   No family history on file.   Home Medications:   Prior to Admission medications   Medication Sig Start Date End Date Taking? Authorizing Provider  gabapentin (NEURONTIN) 100 MG capsule Take 1 capsule (100 mg total) by mouth 3 (three) times daily. 10/16/18  Yes Mike Gipouglas, Andre, FNP  hydrOXYzine (ATARAX/VISTARIL) 25 MG tablet TAKE 1 TO 2 TABLETS(25 TO 50 MG) BY MOUTH AT BEDTIME Patient taking differently: Take 25-50 mg by mouth at bedtime.  11/13/18  Yes Mike Gipouglas, Andre, FNP  ibuprofen (ADVIL,MOTRIN) 800 MG tablet Take 1 tablet (800 mg total) by mouth every 8 (eight) hours as needed for  moderate pain. 03/30/18  Yes Mike Gipouglas, Andre, FNP  ergocalciferol (DRISDOL) 50000 units capsule Take 1 capsule (50,000 Units total) by mouth once a week. Patient not taking: Reported on 07/26/2018 10/06/17   Massie MaroonHollis, Ruta Capece M, FNP  hydrOXYzine (ATARAX/VISTARIL) 25 MG tablet TAKE 1 TO 2 TABLETS(25 TO 50 MG) BY MOUTH AT BEDTIME Patient not taking: Reported on 11/21/2018 11/13/18   Mike Gipouglas, Andre, FNP     Allergies:   No Known Allergies   Physical Exam:   Vitals:   Vitals:   11/22/18 1019  11/22/18 1319  BP: 122/76 110/72  Pulse: 70   Resp: 13 10  Temp: 98.6 F (37 C)   SpO2: 100% 97%    Physical Exam: Constitutional: Patient appears well-developed and well-nourished. Moderate distress, tearful.  HENT: Normocephalic, atraumatic, External right and left ear normal. Oropharynx is clear and moist.  Eyes: Conjunctivae and EOM are normal. PERRLA, no scleral icterus. Neck: Normal ROM. Neck supple. No JVD. No tracheal deviation. No thyromegaly. CVS: RRR, S1/S2 +, no murmurs, no gallops, no carotid bruit.  Pulmonary: Effort and breath sounds normal, no stridor, rhonchi, wheezes, rales.  Abdominal: Soft. BS +, no distension, tenderness, rebound or guarding.  Musculoskeletal: Normal range of motion. No edema and no tenderness.  Lymphadenopathy: No lymphadenopathy noted, cervical, inguinal or axillary Neuro: Alert. Normal reflexes, muscle tone coordination. No cranial nerve deficit. Skin: Skin is warm and dry. No rash noted. Not diaphoretic. No erythema. No pallor. Psychiatric: Normal mood and affect. Behavior, judgment, thought content normal.   Data Review:   CBC Recent Labs  Lab 11/21/18 2310  WBC 13.1*  HGB 10.5*  HCT 27.8*  PLT 415*  MCV 84.0  MCH 31.7  MCHC 37.8*  RDW 14.3  LYMPHSABS 4.3*  MONOABS 0.8  EOSABS 0.3  BASOSABS 0.1   ------------------------------------------------------------------------------------------------------------------  Chemistries  Recent Labs  Lab 11/21/18 2310  NA 143  K 4.0  CL 111  CO2 24  GLUCOSE 91  BUN 15  CREATININE 0.91  CALCIUM 8.8*  AST 18  ALT 15  ALKPHOS 40  BILITOT 1.0   ------------------------------------------------------------------------------------------------------------------ CrCl cannot be calculated (Unknown ideal weight.). ------------------------------------------------------------------------------------------------------------------ No results for input(s): TSH, T4TOTAL, T3FREE, THYROIDAB in  the last 72 hours.  Invalid input(s): FREET3  Coagulation profile No results for input(s): INR, PROTIME in the last 168 hours. ------------------------------------------------------------------------------------------------------------------- No results for input(s): DDIMER in the last 72 hours. -------------------------------------------------------------------------------------------------------------------  Cardiac Enzymes No results for input(s): CKMB, TROPONINI, MYOGLOBIN in the last 168 hours.  Invalid input(s): CK ------------------------------------------------------------------------------------------------------------------ No results found for: BNP  ---------------------------------------------------------------------------------------------------------------  Urinalysis    Component Value Date/Time   COLORURINE YELLOW 09/03/2016 1904   APPEARANCEUR HAZY (A) 09/03/2016 1904   LABSPEC 1.009 09/03/2016 1904   PHURINE 5.0 09/03/2016 1904   GLUCOSEU NEGATIVE 09/03/2016 1904   HGBUR NEGATIVE 09/03/2016 1904   BILIRUBINUR neg 03/30/2018 0841   KETONESUR NEGATIVE 09/03/2016 1904   PROTEINUR Negative 03/30/2018 0841   PROTEINUR NEGATIVE 09/03/2016 1904   UROBILINOGEN 0.2 03/30/2018 0841   UROBILINOGEN 1.0 12/25/2012 1755   NITRITE neg 03/30/2018 0841   NITRITE NEGATIVE 09/03/2016 1904   LEUKOCYTESUR Small (1+) (A) 03/30/2018 0841    ----------------------------------------------------------------------------------------------------------------   Imaging Results:    Dg Chest Port 1 View  Result Date: 11/21/2018 CLINICAL DATA:  Sickle cell, chest pain EXAM: PORTABLE CHEST 1 VIEW COMPARISON:  07/26/2018 FINDINGS: Linear scarring or atelectasis at the left base. No acute consolidation or effusion. Stable cardiomediastinal silhouette. No pneumothorax. IMPRESSION: No active disease. Electronically Signed  By: Donavan Foil M.D.   On: 11/21/2018 23:01    My personal  review of EKG: Unremarkable NSR   Assessment & Plan:  Active Problems:   Sickle cell pain crisis (HCC)   Opioid dependence in remission ([place])   Sickle cell disease (HCC)   Sickle cell disease with pain crisis:   Admit, start IVF  0 .45% Saline @ 125 mls/hour. Patient opiate naive, continue full dose PCA.   IV Toradol 15 mg Q 6 H, Monitor vitals very closely, Re-evaluate pain scale regularly,  Maintain oxygen above 90%, 2 L supplemental oxygen as needed.   Patient will be re-evaluated for pain in the context of function and relationship to baseline as care progresses. Continue folic acid 1 mg for bone marrow support  Sickle cell anemia:  Hemoglobin 10.5, consistent with patient's baseline. No clinical indication for blood transfusion at this time.  Repeat CBC in am  Leukocytosis:  WBCs 13.1. Patient afebrile. No signs of infection or inflammation. Suspected to be reactive.  Follow CBC   DVT Prophylaxis: Subcut Lovenox   AM Labs Ordered, also please review Full Orders  Family Communication: Admission, patient's condition and plan of care including tests being ordered have been discussed with the patient who indicate understanding and agree with the plan and Code Status.  Code Status: Full Code  Consults called: None    Admission status: Inpatient    Time spent in minutes : 50 minutes  [place], MSN, FNP-C [facility] [address] [place],  [id number] [phone]  11/22/2018 at 2:11 PM

## 2018-11-23 LAB — CBC WITH DIFFERENTIAL/PLATELET
Abs Immature Granulocytes: 0.05 10*3/uL (ref 0.00–0.07)
Basophils Absolute: 0 10*3/uL (ref 0.0–0.1)
Basophils Relative: 0 %
Eosinophils Absolute: 0.3 10*3/uL (ref 0.0–0.5)
Eosinophils Relative: 2 %
HCT: 25.4 % — ABNORMAL LOW (ref 36.0–46.0)
Hemoglobin: 9.3 g/dL — ABNORMAL LOW (ref 12.0–15.0)
Immature Granulocytes: 0 %
Lymphocytes Relative: 29 %
Lymphs Abs: 4.3 10*3/uL — ABNORMAL HIGH (ref 0.7–4.0)
MCH: 30.9 pg (ref 26.0–34.0)
MCHC: 36.6 g/dL — ABNORMAL HIGH (ref 30.0–36.0)
MCV: 84.4 fL (ref 80.0–100.0)
Monocytes Absolute: 0.8 10*3/uL (ref 0.1–1.0)
Monocytes Relative: 6 %
Neutro Abs: 9.5 10*3/uL — ABNORMAL HIGH (ref 1.7–7.7)
Neutrophils Relative %: 63 %
Platelets: 349 10*3/uL (ref 150–400)
RBC: 3.01 MIL/uL — ABNORMAL LOW (ref 3.87–5.11)
RDW: 14.9 % (ref 11.5–15.5)
WBC: 15 10*3/uL — ABNORMAL HIGH (ref 4.0–10.5)
nRBC: 0.4 % — ABNORMAL HIGH (ref 0.0–0.2)

## 2018-11-23 LAB — COMPREHENSIVE METABOLIC PANEL
ALT: 16 U/L (ref 0–44)
AST: 20 U/L (ref 15–41)
Albumin: 4.3 g/dL (ref 3.5–5.0)
Alkaline Phosphatase: 35 U/L — ABNORMAL LOW (ref 38–126)
Anion gap: 6 (ref 5–15)
BUN: 10 mg/dL (ref 6–20)
CO2: 25 mmol/L (ref 22–32)
Calcium: 9 mg/dL (ref 8.9–10.3)
Chloride: 109 mmol/L (ref 98–111)
Creatinine, Ser: 0.74 mg/dL (ref 0.44–1.00)
GFR calc Af Amer: >60 mL/min (ref >60)
GFR calc non Af Amer: >60 mL/min (ref >60)
Glucose, Bld: 119 mg/dL — ABNORMAL HIGH (ref 70–99)
Potassium: 4.3 mmol/L (ref 3.5–5.1)
Sodium: 140 mmol/L (ref 135–145)
Total Bilirubin: 1.5 mg/dL — ABNORMAL HIGH (ref 0.3–1.2)
Total Protein: 7.4 g/dL (ref 6.5–8.1)

## 2018-11-23 LAB — HIV ANTIBODY (ROUTINE TESTING W REFLEX): HIV Screen 4th Generation wRfx: NONREACTIVE

## 2018-11-23 MED ORDER — OXYCODONE HCL 5 MG PO TABS
10.0000 mg | ORAL_TABLET | ORAL | Status: DC | PRN
  Administered 2018-11-23 – 2018-11-26 (×8): 10 mg via ORAL
  Filled 2018-11-23 (×9): qty 2

## 2018-11-23 NOTE — Progress Notes (Signed)
Subjective: Lindsey [name], a 48 year old female with medical history significant for sickle cell disease, type [place], chronic pain syndrome, history of anemia of chronic disease, and history of opiate dependence in remission was admitted for sickle cell pain crisis.  Patient states the pain has improved some overnight.  Current pain intensity 7/10 primarily to left lower extremity. Patient is afebrile and maintaining oxygen saturation at 100% on RA.  Patient denies headache, chest pain, shortness of breath, dysuria, nausea, vomiting, or diarrhea.  Objective:  Vital signs in last 24 hours:  Vitals:   11/23/18 1124 11/23/18 1154 11/23/18 1653 11/23/18 1700  BP:  137/85 (!) 150/67   Pulse:  74 67   Resp: (!) 7 13 12 11   Temp:  99 F (37.2 C) 98.6 F (37 C)   TempSrc:  Oral Oral   SpO2: 96% 100% 100% 92%  Weight:      Height:        Intake/Output from previous day:  No intake or output data in the 24 hours ending 11/23/18 1712  Physical Exam: General: Alert, awake, oriented x3, in no acute distress.  HEENT: [place]/AT PEERL, EOMI Neck: Trachea midline,  no masses, no thyromegal,y no JVD, no carotid bruit OROPHARYNX:  Moist, No exudate/ erythema/lesions.  Heart: Regular rate and rhythm, without murmurs, rubs, gallops, PMI non-displaced, no heaves or thrills on palpation.  Lungs: Clear to auscultation, no wheezing or rhonchi noted. No increased vocal fremitus resonant to percussion  Abdomen: Soft, nontender, nondistended, positive bowel sounds, no masses no hepatosplenomegaly noted..  Neuro: No focal neurological deficits noted cranial nerves II through XII grossly intact. DTRs 2+ bilaterally upper and lower extremities. Strength 5 out of 5 in bilateral upper and lower extremities. Musculoskeletal: No warm swelling or erythema around joints, no spinal tenderness noted. Psychiatric: Patient alert and oriented x3, good insight and cognition, good recent to remote recall. Lymph node survey:  No cervical axillary or inguinal lymphadenopathy noted.  Lab Results:  Basic Metabolic Panel:    Component Value Date/Time   NA 140 11/23/2018 0550   NA 142 03/30/2018 0852   K 4.3 11/23/2018 0550   CL 109 11/23/2018 0550   CO2 25 11/23/2018 0550   BUN 10 11/23/2018 0550   BUN 10 03/30/2018 0852   CREATININE 0.74 11/23/2018 0550   GLUCOSE 119 (H) 11/23/2018 0550   CALCIUM 9.0 11/23/2018 0550   CBC:    Component Value Date/Time   WBC 15.0 (H) 11/23/2018 0550   HGB 9.3 (L) 11/23/2018 0550   HGB 10.1 (L) 03/30/2018 0852   HCT 25.4 (L) 11/23/2018 0550   HCT 29.8 (L) 03/30/2018 0852   PLT 349 11/23/2018 0550   PLT 517 (H) 03/30/2018 0852   MCV 84.4 11/23/2018 0550   MCV 93 03/30/2018 0852   NEUTROABS 9.5 (H) 11/23/2018 0550   NEUTROABS 6.1 03/30/2018 0852   LYMPHSABS 4.3 (H) 11/23/2018 0550   LYMPHSABS 2.8 03/30/2018 0852   MONOABS 0.8 11/23/2018 0550   EOSABS 0.3 11/23/2018 0550   EOSABS 0.4 03/30/2018 0852   BASOSABS 0.0 11/23/2018 0550   BASOSABS 0.1 03/30/2018 0852    No results found for this or any previous visit (from the past 240 hour(s)).  Studies/Results: Dg Chest Port 1 View  Result Date: 11/21/2018 CLINICAL DATA:  Sickle cell, chest pain EXAM: PORTABLE CHEST 1 VIEW COMPARISON:  07/26/2018 FINDINGS: Linear scarring or atelectasis at the left base. No acute consolidation or effusion. Stable cardiomediastinal silhouette. No pneumothorax. IMPRESSION: No active disease. Electronically  Signed   By: Jasmine PangKim  Fujinaga M.D.   On: 11/21/2018 23:01    Medications: Scheduled Meds: . enoxaparin (LOVENOX) injection  40 mg Subcutaneous Q24H  . gabapentin  100 mg Oral TID  . HYDROmorphone   Intravenous Q4H  . hydrOXYzine  25-50 mg Oral QHS  . ketorolac  15 mg Intravenous Q6H  . ketorolac  15 mg Intravenous Once  . senna-docusate  1 tablet Oral BID   Continuous Infusions: . sodium chloride 50 mL/hr at 11/23/18 1202   PRN Meds:.diphenhydrAMINE, naloxone **AND** sodium  chloride flush, ondansetron (ZOFRAN) IV, oxyCODONE, polyethylene glycol   Assessment/Plan: Active Problems:   Sickle cell pain crisis (HCC)   Opioid dependence in remission (HCC)   Sickle cell disease (HCC)  Sickle cell disease with pain crisis: Reduce IV fluids to 0.45% saline at 75 mL/h Continue full dose PCA Oxycodone 10 mg every 4 hours as needed for moderate to severe breakthrough pain. IV Toradol 15 mg every 6 hours Monitor vital signs closely Maintain oxygen saturation above 90%  Sickle cell anemia: Hemoglobin stable.  Consistent with patient's baseline.  No clinical indication for blood transfusion at this time. Follow CBC.  Leukocytosis: WBCs 15, which is slightly increased from yesterday.  Patient afebrile.  No signs of infection or inflammation.  Continue to follow CBC. Repeat CBC in a.m.   Code Status: Full Code Family Communication: N/A Disposition Plan: Not yet ready for discharge  Eilidh Marcano Rennis PettyMoore Dmarcus Decicco  APRN, MSN, FNP-C [facility] [address]  [place], [place] [id number] [phone]  If 7PM-7AM, please contact night-coverage.  11/23/2018, 5:12 PM  LOS: 1 day

## 2018-11-24 DIAGNOSIS — F1121 Opioid dependence, in remission: Secondary | ICD-10-CM

## 2018-11-24 DIAGNOSIS — D5700 Hb-SS disease with crisis, unspecified: Secondary | ICD-10-CM

## 2018-11-24 LAB — NOVEL CORONAVIRUS, NAA (HOSP ORDER, SEND-OUT TO REF LAB; TAT 18-24 HRS): SARS-CoV-2, NAA: NOT DETECTED

## 2018-11-24 NOTE — Progress Notes (Signed)
Subjective: 48 year old female admitted with sickle cell crisis.  Patient had past history of drug abuse and has been very worried about narcotics.  She has not been taking Narcotics because narcotics for fear of getting dependent on it.  Pain is currently at 7 out of 10.  No nausea vomiting diarrhea. Patient has been weak.  Pain is worse when she gets out of today.    Objective: Vital signs in last 24 hours: Temp:  [98.7 F (37.1 C)-99.4 F (37.4 C)] 99.4 F (37.4 C) (07/18 2003) Pulse Rate:  [63-72] 67 (07/18 2003) Resp:  [9-17] 13 (07/18 2003) BP: (106-164)/(64-89) 128/84 (07/18 2003) SpO2:  [97 %-100 %] 98 % (07/18 2003) Weight change:  Last BM Date: 11/21/18  Intake/Output from previous day: No intake/output data recorded. Intake/Output this shift: No intake/output data recorded.  General appearance: alert, cooperative, appears stated age and no distress Head: Normocephalic, without obvious abnormality, atraumatic Neck: no adenopathy, no carotid bruit, no JVD, supple, symmetrical, trachea midline and thyroid not enlarged, symmetric, no tenderness/mass/nodules Back: symmetric, no curvature. ROM normal. No CVA tenderness. Resp: clear to auscultation bilaterally Chest wall: no tenderness Cardio: regular rate and rhythm, S1, S2 normal, no murmur, click, rub or gallop GI: soft, non-tender; bowel sounds normal; no masses,  no organomegaly Extremities: extremities normal, atraumatic, no cyanosis or edema Skin: Skin color, texture, turgor normal. No rashes or lesions Neurologic: Grossly normal  Lab Results: Recent Labs    11/22/18 1518 11/23/18 0550  WBC 13.8* 15.0*  HGB 9.3* 9.3*  HCT 25.2* 25.4*  PLT 338 349   BMET Recent Labs    11/21/18 2310 11/22/18 1518 11/23/18 0550  NA 143  --  140  K 4.0  --  4.3  CL 111  --  109  CO2 24  --  25  GLUCOSE 91  --  119*  BUN 15  --  10  CREATININE 0.91 0.87 0.74  CALCIUM 8.8*  --  9.0    Studies/Results: No results  found.  Medications: I have reviewed the patient's current medications.  Assessment/Plan: 48 year old admitted with sickle cell painful crisis.  #1 sickle cell painful crisis: patient will be continued on oral oxycodone scheduled every 4 hours.  Monitor patient overnight to see if she improves.  #2 sickle cell anemia: H/H at baseline  #3 leukocytosis: due to sickle cell crisis.  Continue treatment  LOS: 2 days   Rasha Ibe,LAWAL 11/24/2018, 9:44 PM

## 2018-11-25 MED ORDER — HYDROMORPHONE HCL 1 MG/ML IJ SOLN
1.0000 mg | INTRAMUSCULAR | Status: DC | PRN
  Administered 2018-11-25 – 2018-11-26 (×5): 1 mg via INTRAVENOUS
  Filled 2018-11-25 (×5): qty 1

## 2018-11-25 MED ORDER — SUMATRIPTAN SUCCINATE 25 MG PO TABS
25.0000 mg | ORAL_TABLET | Freq: Once | ORAL | Status: DC
  Filled 2018-11-25: qty 1

## 2018-11-25 NOTE — Progress Notes (Signed)
Subjective: Patient still having significant pain.  The oral medications are not helping much.  Pain is 6 out of 10.  She has difficulty with the use of the PCA.  She denies nausea vomiting or diarrhea..    Objective: Vital signs in last 24 hours: Temp:  [99 F (37.2 C)-99.9 F (37.7 C)] 99.3 F (37.4 C) (07/19 2030) Pulse Rate:  [60-83] 83 (07/19 2030) Resp:  [11-20] 16 (07/19 2030) BP: (115-159)/(69-79) 135/75 (07/19 2030) SpO2:  [98 %-100 %] 99 % (07/19 2030) Weight change:  Last BM Date: 11/21/18  Intake/Output from previous day: 07/18 0701 - 07/19 0700 In: 1163.3 [I.V.:1163.3] Out: -  Intake/Output this shift: No intake/output data recorded.  General appearance: alert, cooperative, appears stated age and no distress Head: Normocephalic, without obvious abnormality, atraumatic Neck: no adenopathy, no carotid bruit, no JVD, supple, symmetrical, trachea midline and thyroid not enlarged, symmetric, no tenderness/mass/nodules Back: symmetric, no curvature. ROM normal. No CVA tenderness. Resp: clear to auscultation bilaterally Chest wall: no tenderness Cardio: regular rate and rhythm, S1, S2 normal, no murmur, click, rub or gallop GI: soft, non-tender; bowel sounds normal; no masses,  no organomegaly Extremities: extremities normal, atraumatic, no cyanosis or edema Skin: Skin color, texture, turgor normal. No rashes or lesions Neurologic: Grossly normal  Lab Results: Recent Labs    11/23/18 0550  WBC 15.0*  HGB 9.3*  HCT 25.4*  PLT 349   BMET Recent Labs    11/23/18 0550  NA 140  K 4.3  CL 109  CO2 25  GLUCOSE 119*  BUN 10  CREATININE 0.74  CALCIUM 9.0    Studies/Results: No results found.  Medications: I have reviewed the patient's current medications.  Assessment/Plan: 48 year old admitted with sickle cell painful crisis.  #1 sickle cell painful crisis: I will add Dilaudid IV 1 mg Q 3 hrs PRN. Monitor patient overnight to see if she improves.  #2  sickle cell anemia: H/H at baseline  #3 leukocytosis: due to sickle cell crisis.  Continue treatment   LOS: 3 days   Jhovani Griswold,LAWAL 11/25/2018, 8:33 PM

## 2018-11-26 LAB — CBC WITH DIFFERENTIAL/PLATELET
Abs Immature Granulocytes: 0.07 10*3/uL (ref 0.00–0.07)
Basophils Absolute: 0 10*3/uL (ref 0.0–0.1)
Basophils Relative: 0 %
Eosinophils Absolute: 0.5 10*3/uL (ref 0.0–0.5)
Eosinophils Relative: 5 %
HCT: 20.6 % — ABNORMAL LOW (ref 36.0–46.0)
Hemoglobin: 7.4 g/dL — ABNORMAL LOW (ref 12.0–15.0)
Immature Granulocytes: 1 %
Lymphocytes Relative: 21 %
Lymphs Abs: 2.4 10*3/uL (ref 0.7–4.0)
MCH: 30.5 pg (ref 26.0–34.0)
MCHC: 35.9 g/dL (ref 30.0–36.0)
MCV: 84.8 fL (ref 80.0–100.0)
Monocytes Absolute: 0.9 10*3/uL (ref 0.1–1.0)
Monocytes Relative: 8 %
Neutro Abs: 7.6 10*3/uL (ref 1.7–7.7)
Neutrophils Relative %: 65 %
Platelets: 268 10*3/uL (ref 150–400)
RBC: 2.43 MIL/uL — ABNORMAL LOW (ref 3.87–5.11)
RDW: 15.7 % — ABNORMAL HIGH (ref 11.5–15.5)
WBC: 11.4 10*3/uL — ABNORMAL HIGH (ref 4.0–10.5)
nRBC: 0.6 % — ABNORMAL HIGH (ref 0.0–0.2)

## 2018-11-26 LAB — COMPREHENSIVE METABOLIC PANEL
ALT: 24 U/L (ref 0–44)
AST: 26 U/L (ref 15–41)
Albumin: 3.4 g/dL — ABNORMAL LOW (ref 3.5–5.0)
Alkaline Phosphatase: 49 U/L (ref 38–126)
Anion gap: 8 (ref 5–15)
BUN: 8 mg/dL (ref 6–20)
CO2: 26 mmol/L (ref 22–32)
Calcium: 8.4 mg/dL — ABNORMAL LOW (ref 8.9–10.3)
Chloride: 108 mmol/L (ref 98–111)
Creatinine, Ser: 0.67 mg/dL (ref 0.44–1.00)
GFR calc Af Amer: >60 mL/min (ref >60)
GFR calc non Af Amer: >60 mL/min (ref >60)
Glucose, Bld: 92 mg/dL (ref 70–99)
Potassium: 3.7 mmol/L (ref 3.5–5.1)
Sodium: 142 mmol/L (ref 135–145)
Total Bilirubin: 2.5 mg/dL — ABNORMAL HIGH (ref 0.3–1.2)
Total Protein: 6.2 g/dL — ABNORMAL LOW (ref 6.5–8.1)

## 2018-11-26 MED ORDER — OXYCODONE HCL 5 MG PO TABS: 5.0000 mg | ORAL_TABLET | ORAL | 0 refills | Status: DC | PRN

## 2018-11-26 MED ORDER — GABAPENTIN 300 MG PO CAPS: 300.0000 mg | ORAL_CAPSULE | Freq: Three times a day (TID) | ORAL | 0 refills | Status: DC

## 2018-11-26 NOTE — Discharge Summary (Signed)
Physician Discharge Summary  Lindsey [name] SNK:[id number] DOB: [birth year] DOA: 11/22/2018  PCP: Lindsey Garter, MD  Admit date: 11/22/2018  Discharge date: 11/26/2018  Discharge Diagnoses:  Active Problems:   Sickle cell pain crisis ([place])   Opioid dependence in remission ([place])   Sickle cell disease ([place])   Discharge Condition: Stable  Disposition:  Pt is discharged home in good condition and is to follow up with Lindsey Garter, MD this week to have labs evaluated. Lindsey [name] is instructed to increase activity slowly and balance with rest for the next few days, and use prescribed medication to complete treatment of pain  Diet: Regular Wt Readings from Last 3 Encounters:  11/22/18 75.6 kg  07/26/18 74.8 kg  03/30/18 76.7 kg    History of present illness:  Lindsey [name]  is a 48 y.o. female with a medical history significant for sickle cell disease, type Lindsey [name], chronic pain syndrome, history of anemia of chronic disease, and history of opiate dependence in remission presented to sickle cell day infusion center complaining of upper extremity pain.  Patient states that pain intensity increased 3 days ago following a 4 mile walk outdoors.  She was awakened by chest pains the following day and reported to the ER for evaluation.  Chest x-ray showed no acute cardiopulmonary process and EKG was unremarkable.  Patient was treated with IV pain medications in ER and pain was managed at discharge.  She says that she was awakened by upper extremity pain, primarily right shortly following discharge.  Pain intensity on admission 10/10 characterized as constant and throbbing.  Patient has a history of opiate dependence and chronic opiate use, patient states that she went to drug rehabilitation in [place] for opiate dependence.  Patient primarily takes ibuprofen and gabapentin for pain management, she last had medications this a.m. without success. Patient denies fever, chills, sick contacts or  exposure to COVID-19.  Patient also denies headache, shortness breath, dizziness, paresthesias, dysuria, nausea, vomiting, or diarrhea.   Sickle cell day infusion center course: Patient afebrile and maintaining oxygen saturation above 90%.  All laboratory values unremarkable. Pain persists despite IV Dilaudid PCA, full dose, IV fluids, IV Toradol, and Tylenol 1000 mg by mouth.  Patient transition to [place] [facility] for sickle cell pain crisis. Hospital Course:  Sickle cell anemia with pain crisis: Patient was admitted for sickle cell pain crisis and managed appropriately with IVF, IV Dilaudid via PCA and IV Toradol, as well as other adjunct therapies per sickle cell pain management protocols.  Patient transition to oxycodone 10 mg every 4 hours,.  She states that pain intensity has decreased.  She is not having any pain at this time. Patient does not have opiate pain medications at home.  We will continue care with oxycodone 5 mg every 4 hours as needed for moderate to severe pain #20.  Gabapentin 300 mg 3 times daily.  And increase fluid intake to 64 ounces of water per day.  Also, ibuprofen 800 mg every 3 8 hours with food for mild to moderate pain. Leukocytosis: WBCs 15.0 on admission.  Trended appropriately to 11.4.  Patient afebrile.  No signs of infection or inflammation.  Coronavirus test negative.  Patient vital signs are within normal range.  She is afebrile and maintaining oxygen saturation at 100% on RA.  Will follow-up with primary care provider 2 weeks Patient is not having pain at this time.  Medications have been sent to pharmacy.  Patient oxygen saturation above 90%. Patient was discharged home  today in a hemodynamically stable condition.   Discharge Exam: Vitals:   11/26/18 0833 11/26/18 0848  BP: 118/77   Pulse: 88   Resp: 20 18  Temp: 98.7 F (37.1 C)   SpO2: 96% 96%   Vitals:   11/26/18 0435 11/26/18 0442 11/26/18 0833 11/26/18 0848  BP:  127/72 118/77   Pulse:  74 88    Resp: 20 20 20 18   Temp:  99 F (37.2 C) 98.7 F (37.1 C)   TempSrc:  Oral Oral   SpO2: 95% 98% 96% 96%  Weight:      Height:        General appearance : Awake, alert, not in any distress. Speech Clear. Not toxic looking HEENT: Atraumatic and Normocephalic, pupils equally reactive to light and accomodation Neck: Supple, no JVD. No cervical lymphadenopathy.  Chest: Good air entry bilaterally, no added sounds  CVS: S1 S2 regular, no murmurs.  Abdomen: Bowel sounds present, Non tender and not distended with no gaurding, rigidity or rebound. Extremities: B/L Lower Ext shows no edema, both legs are warm to touch Neurology: Awake alert, and oriented X 3, CN II-XII intact, Non focal Skin: No Rash  Discharge Instructions  Discharge Instructions    Discharge patient   Complete by: As directed    Discharge disposition: 01-Home or Self Care   Discharge patient date: 11/26/2018     Allergies as of 11/26/2018   No Known Allergies     Medication List    TAKE these medications   ergocalciferol 1.25 MG (50000 UT) capsule Commonly known as: Drisdol Take 1 capsule (50,000 Units total) by mouth once a week.   gabapentin 300 MG capsule Commonly known as: NEURONTIN Take 1 capsule (300 mg total) by mouth 3 (three) times daily. What changed:   medication strength  how much to take   hydrOXYzine 25 MG tablet Commonly known as: ATARAX/VISTARIL TAKE 1 TO 2 TABLETS(25 TO 50 MG) BY MOUTH AT BEDTIME What changed: Another medication with the same name was removed. Continue taking this medication, and follow the directions you see here.   ibuprofen 800 MG tablet Commonly known as: ADVIL Take 1 tablet (800 mg total) by mouth every 8 (eight) hours as needed for moderate pain.   oxyCODONE 5 MG immediate release tablet Commonly known as: Roxicodone Take 1 tablet (5 mg total) by mouth every 4 (four) hours as needed for severe pain.       The results of significant diagnostics from this  hospitalization (including imaging, microbiology, ancillary and laboratory) are listed below for reference.    Significant Diagnostic Studies: Dg Chest Port 1 View  Result Date: 11/21/2018 CLINICAL DATA:  Sickle cell, chest pain EXAM: PORTABLE CHEST 1 VIEW COMPARISON:  07/26/2018 FINDINGS: Linear scarring or atelectasis at the left base. No acute consolidation or effusion. Stable cardiomediastinal silhouette. No pneumothorax. IMPRESSION: No active disease. Electronically Signed   By: Jasmine PangKim  Fujinaga M.D.   On: 11/21/2018 23:01    Microbiology: Recent Results (from the past 240 hour(s))  Novel Coronavirus,NAA,(SEND-OUT TO REF LAB - TAT 24-48 hrs); Hosp Order     Status: None   Collection Time: 11/22/18  1:53 PM   Specimen: Nasopharyngeal Swab; Respiratory  Result Value Ref Range Status   SARS-CoV-2, NAA NOT DETECTED NOT DETECTED Final    Comment: (NOTE) This test was developed and its performance characteristics determined by [facility]. This test has not been FDA cleared or approved. This test has been authorized by FDA under  an Emergency Use Authorization (EUA). This test is only authorized for the duration of time the declaration that circumstances exist justifying the authorization of the emergency use of in vitro diagnostic tests for detection of SARS-CoV-2 virus and/or diagnosis of COVID-19 infection under section 564(b)(1) of the Act, 21 U.S.C. 409WJX-9(J)(4360bbb-3(b)(1), unless the authorization is terminated or revoked sooner. When diagnostic testing is negative, the possibility of a false negative result should be considered in the context of a patient's recent exposures and the presence of clinical signs and symptoms consistent with COVID-19. An individual without symptoms of COVID-19 and who is not shedding SARS-CoV-2 virus would expect to have a negative (not detected) result in this assay. Performed  At: [facility] [address] [place], [place]  [id number] Jolene SchimkeNagendra Sanjai MD YQ:[id number]:[phone]    Coronavirus Source NASOPHARYNGEAL  Final     Labs: Basic Metabolic Panel: Recent Labs  Lab 11/21/18 2310 11/22/18 1518 11/23/18 0550 11/26/18 0556  NA 143  --  140 142  K 4.0  --  4.3 3.7  CL 111  --  109 108  CO2 24  --  25 26  GLUCOSE 91  --  119* 92  BUN 15  --  10 8  CREATININE 0.91 0.87 0.74 0.67  CALCIUM 8.8*  --  9.0 8.4*   Liver Function Tests: Recent Labs  Lab 11/21/18 2310 11/23/18 0550 11/26/18 0556  AST 18 20 26   ALT 15 16 24   ALKPHOS 40 35* 49  BILITOT 1.0 1.5* 2.5*  PROT 7.0 7.4 6.2*  ALBUMIN 4.1 4.3 3.4*   No results for input(s): LIPASE, AMYLASE in the last 168 hours. No results for input(s): AMMONIA in the last 168 hours. CBC: Recent Labs  Lab 11/21/18 2310 11/22/18 1518 11/23/18 0550 11/26/18 0556  WBC 13.1* 13.8* 15.0* 11.4*  NEUTROABS 7.6  --  9.5* 7.6  HGB 10.5* 9.3* 9.3* 7.4*  HCT 27.8* 25.2* 25.4* 20.6*  MCV 84.0 84.8 84.4 84.8  PLT 415* 338 349 268   Cardiac Enzymes: No results for input(s): CKTOTAL, CKMB, CKMBINDEX, TROPONINI in the last 168 hours. BNP: Invalid input(s): POCBNP CBG: No results for input(s): GLUCAP in the last 168 hours.  Time coordinating discharge: 50 minutes  Signed:  Nolon NationsLachina Moore Elray Dains  APRN, MSN, FNP-C Patient Care [facility] [address]  [place], [place] [id number] [phone]  [facility] 11/26/2018, 1:54 PM

## 2018-11-26 NOTE — Progress Notes (Signed)
Pts IVs removed with clean and dry dressing intact. Pt educated on d/c instructions, follow up, and medications with all questions answered at this time. Pt denies pain with no s/s of distress noted.

## 2018-11-26 NOTE — Discharge Instructions (Signed)
Sickle Cell Anemia, Adult ° °Sickle cell anemia is a condition in which red blood cells have an abnormal “sickle” shape. Red blood cells carry oxygen through the body. Sickle-shaped red blood cells do not live as long as normal red blood cells. They also clump together and block blood from flowing through the blood vessels. This condition prevents the body from getting enough oxygen. Sickle cell anemia causes organ damage and pain. It also increases the risk of infection. °What are the causes? °This condition is caused by a gene that is passed from parent to child (inherited). Receiving two copies of the gene causes the disease. Receiving one copy causes the "trait," which means that symptoms are milder or not present. °What increases the risk? °This condition is more likely to develop if your ancestors were from [place], the Mediterranean, South or [place], the [place], [place], or the Middle [place]. °What are the signs or symptoms? °Symptoms of this condition include: °· Episodes of pain (crises), especially in the hands and feet, joints, back, chest, or abdomen. The pain can be triggered by: °? An illness, especially if there is dehydration. °? Doing an activity with great effort (overexertion). °? Exposure to extreme temperature changes. °? High altitude. °· Fatigue. °· Shortness of breath or difficulty breathing. °· Dizziness. °· Pale skin or yellowed skin (jaundice). °· Frequent bacterial infections. °· Pain and swelling in the hands and feet (hand-food syndrome). °· Prolonged, painful erection of the penis (priapism). °· Acute chest syndrome. Symptoms of this include: °? Chest pain. °? Fever. °? Cough. °? Fast breathing. °· Stroke. °· Decreased activity. °· Loss of appetite. °· Change in behavior. °· Headaches. °· Seizures. °· Vision changes. °· Skin ulcers. °· Heart disease. °· High blood pressure. °· Gallstones. °· Liver and kidney problems. °How is this diagnosed? °This condition is diagnosed with  blood tests that check for the gene that causes this condition. °How is this treated? °There is no cure for most cases of this condition. Treatment focuses on managing your symptoms and preventing complications of the disease. Your health care provider will work with you to identify the best treatment options for you based on an assessment of your condition. Treatment may include: °· Medicines, including: °? Pain medicines. °? Antibiotic medicines for infection. °? Medicines to increase the production of a protein in red blood cells that helps carry oxygen in the body (hemoglobin). °· Fluids to treat pain and swelling. °· Oxygen to treat acute chest syndrome. °· Blood transfusions to treat symptoms such as fatigue, stroke, and acute chest syndrome. °· Massage and physical therapy for pain. °· Regular tests to monitor your condition, such as blood tests, X-rays, CT scans, MRI scans, ultrasounds, and lung function tests. These should be done every 3-12 months, depending on your age. °· Hematopoietic stem cell transplant. This is a procedure to replace abnormal stem cells with healthy stem cells from a donor's bone marrow. Stem cells are cells that can develop into blood cells, and bone marrow is the spongy tissue inside the bones. °Follow these instructions at home: °Medicines °· Take over-the-counter and prescription medicines only as told by your health care provider. °· If you were prescribed an antibiotic medicine, take it as told by your health care provider. Do not stop taking the antibiotic even if you start to feel better. °· If you develop a fever, do not take medicines to reduce the fever right away. This could cover up another problem. Notify your health care provider. °Managing   pain, stiffness, and swelling °· Try these methods to help ease your pain: °? Using a heating pad. °? Taking a warm bath. °? Distracting yourself, such as by watching TV. °Eating and drinking °· Drink enough fluid to keep your urine  clear or pale yellow. Drink more in hot weather and during exercise. °· Limit or avoid drinking alcohol. °· Eat a balanced and nutritious diet. Eat plenty of fruits, vegetables, whole grains, and lean protein. °· Take vitamins and supplements as directed by your health care provider. °Traveling °· When traveling, keep these with you: °? Your medical information. °? The names of your health care providers. °? Your medicines. °· If you have to travel by air, ask about precautions you should take. °Activity °· Get plenty of rest. °· Avoid activities that will lower your oxygen levels, such as exercising vigorously. °General instructions °· Do not use any products that contain nicotine or tobacco, such as cigarettes and e-cigarettes. They lower blood oxygen levels. If you need help quitting, ask your health care provider. °· Consider wearing a medical alert bracelet. °· Avoid high altitudes. °· Avoid extreme temperatures and extreme temperature changes. °· Keep all follow-up visits as told by your health care provider. This is important. °Contact a health care provider if: °· You develop joint pain. °· Your feet or hands swell or have pain. °· You have fatigue. °Get help right away if: °· You have symptoms of infection. These include: °? Fever. °? Chills. °? Extreme tiredness. °? Irritability. °? Poor eating. °? Vomiting. °· You feel dizzy or faint. °· You have new abdominal pain, especially on the left side near the stomach area. °· You develop priapism. °· You have numbness in your arms or legs or have trouble moving them. °· You have trouble talking. °· You develop pain that cannot be controlled with medicine. °· You become short of breath. °· You have rapid breathing. °· You have a persistent cough. °· You have pain in your chest. °· You develop a severe headache or stiff neck. °· You feel bloated without eating or after eating a small amount of food. °· Your skin is pale. °· You suddenly lose  vision. °Summary °· Sickle cell anemia is a condition in which red blood cells have an abnormal “sickle” shape. This disease can cause organ damage and chronic pain, and it can raise your risk of infection. °· Sickle cell anemia is a genetic disorder. °· Treatment focuses on managing your symptoms and preventing complications of the disease. °· Get medical help right away if you have any signs of infection, such as a fever. °This information is not intended to replace advice given to you by your health care provider. Make sure you discuss any questions you have with your health care provider. °Document Released: 08/03/2005 Document Revised: 08/17/2018 Document Reviewed: 05/31/2016 °Elsevier Patient Education © 2020 Elsevier Inc. ° °

## 2018-12-02 ENCOUNTER — Other Ambulatory Visit: Payer: Self-pay | Admitting: Family Medicine

## 2018-12-02 DIAGNOSIS — D571 Sickle-cell disease without crisis: Secondary | ICD-10-CM

## 2018-12-10 ENCOUNTER — Ambulatory Visit: Payer: Medicaid Other | Admitting: Family Medicine

## 2018-12-19 ENCOUNTER — Encounter: Payer: Self-pay | Admitting: Family Medicine

## 2018-12-19 ENCOUNTER — Other Ambulatory Visit: Payer: Self-pay

## 2018-12-19 ENCOUNTER — Ambulatory Visit (INDEPENDENT_AMBULATORY_CARE_PROVIDER_SITE_OTHER): Payer: Medicaid Other | Admitting: Family Medicine

## 2018-12-19 VITALS — BP 123/53 | HR 71 | Temp 98.8°F | Resp 14 | Ht 60.0 in | Wt 159.0 lb

## 2018-12-19 DIAGNOSIS — D5700 Hb-SS disease with crisis, unspecified: Secondary | ICD-10-CM

## 2018-12-19 DIAGNOSIS — J40 Bronchitis, not specified as acute or chronic: Secondary | ICD-10-CM

## 2018-12-19 LAB — POCT URINALYSIS DIPSTICK
Bilirubin, UA: NEGATIVE
Glucose, UA: NEGATIVE
Ketones, UA: NEGATIVE
Nitrite, UA: NEGATIVE
Protein, UA: NEGATIVE
Spec Grav, UA: 1.01 (ref 1.010–1.025)
Urobilinogen, UA: 0.2 E.U./dL
pH, UA: 5.5 (ref 5.0–8.0)

## 2018-12-19 MED ORDER — PREDNISONE 10 MG (21) PO TBPK: ORAL_TABLET | Freq: Every day | ORAL | 0 refills | Status: DC

## 2018-12-19 MED ORDER — ALBUTEROL SULFATE HFA 108 (90 BASE) MCG/ACT IN AERS: 2.0000 | INHALATION_SPRAY | Freq: Four times a day (QID) | RESPIRATORY_TRACT | 0 refills | Status: DC | PRN

## 2018-12-19 MED ORDER — AMOXICILLIN-POT CLAVULANATE 875-125 MG PO TABS: 1.0000 | ORAL_TABLET | Freq: Two times a day (BID) | ORAL | 0 refills | Status: AC

## 2018-12-19 MED ORDER — OXYCODONE HCL 5 MG PO TABS: 5.0000 mg | ORAL_TABLET | ORAL | 0 refills | Status: DC | PRN

## 2018-12-19 NOTE — Patient Instructions (Addendum)
Sickle Cell Anemia, Adult  Sickle cell anemia is a condition where your red blood cells are shaped like sickles. Red blood cells carry oxygen through the body. Sickle-shaped cells do not live as long as normal red blood cells. They also clump together and block blood from flowing through the blood vessels. This prevents the body from getting enough oxygen. Sickle cell anemia causes organ damage and pain. It also increases the risk of infection. Follow these instructions at home: Medicines  Take over-the-counter and prescription medicines only as told by your doctor.  If you were prescribed an antibiotic medicine, take it as told by your doctor. Do not stop taking the antibiotic even if you start to feel better.  If you develop a fever, do not take medicines to lower the fever right away. Tell your doctor about the fever. Managing pain, stiffness, and swelling  Try these methods to help with pain: ? Use a heating pad. ? Take a warm bath. ? Distract yourself, such as by watching TV. Eating and drinking  Drink enough fluid to keep your pee (urine) clear or pale yellow. Drink more in hot weather and during exercise.  Limit or avoid alcohol.  Eat a healthy diet. Eat plenty of fruits, vegetables, whole grains, and lean protein.  Take vitamins and supplements as told by your doctor. Traveling  When traveling, keep these with you: ? Your medical information. ? The names of your doctors. ? Your medicines.  If you need to take an airplane, talk to your doctor first. Activity  Rest often.  Avoid exercises that make your heart beat much faster, such as jogging. General instructions  Do not use products that have nicotine or tobacco, such as cigarettes and e-cigarettes. If you need help quitting, ask your doctor.  Consider wearing a medical alert bracelet.  Avoid being in high places (high altitudes), such as mountains.  Avoid very hot or cold temperatures.  Avoid places where the  temperature changes a lot.  Keep all follow-up visits as told by your doctor. This is important. Contact a doctor if:  A joint hurts.  Your feet or hands hurt or swell.  You feel tired (fatigued). Get help right away if:  You have symptoms of infection. These include: ? Fever. ? Chills. ? Being very tired. ? Irritability. ? Poor eating. ? Throwing up (vomiting).  You feel dizzy or faint.  You have new stomach pain, especially on the left side.  You have a an erection (priapism) that lasts more than 4 hours.  You have numbness in your arms or legs.  You have a hard time moving your arms or legs.  You have trouble talking.  You have pain that does not go away when you take medicine.  You are short of breath.  You are breathing fast.  You have a long-term cough.  You have pain in your chest.  You have a bad headache.  You have a stiff neck.  Your stomach looks bloated even though you did not eat much.  Your skin is pale.  You suddenly cannot see well. Summary  Sickle cell anemia is a condition where your red blood cells are shaped like sickles.  Follow your doctor's advice on ways to manage pain, food to eat, activities to do, and steps to take for safe travel.  Get medical help right away if you have any signs of infection, such as a fever. This information is not intended to replace advice given to you by   your health care provider. Make sure you discuss any questions you have with your health care provider. Document Released: 02/13/2013 Document Revised: 08/17/2018 Document Reviewed: 05/31/2016 Elsevier Patient Education  2020 [employer].  Acute Bronchitis, Adult Acute bronchitis is when air tubes (bronchi) in the lungs suddenly get swollen. The condition can make it hard to breathe. It can also cause these symptoms:  A cough.  Coughing up clear, yellow, or green mucus.  Wheezing.  Chest congestion.  Shortness of breath.  A fever.  Body  aches.  Chills.  A sore throat. Follow these instructions at home:  Medicines  Take over-the-counter and prescription medicines only as told by your doctor.  If you were prescribed an antibiotic medicine, take it as told by your doctor. Do not stop taking the antibiotic even if you start to feel better. General instructions  Rest.  Drink enough fluids to keep your pee (urine) pale yellow.  Avoid smoking and secondhand smoke. If you smoke and you need help quitting, ask your doctor. Quitting will help your lungs heal faster.  Use an inhaler, cool mist vaporizer, or humidifier as told by your doctor.  Keep all follow-up visits as told by your doctor. This is important. How is this prevented? To lower your risk of getting this condition again:  Wash your hands often with soap and water. If you cannot use soap and water, use hand sanitizer.  Avoid contact with people who have cold symptoms.  Try not to touch your hands to your mouth, nose, or eyes.  Make sure to get the flu shot every year. Contact a doctor if:  Your symptoms do not get better in 2 weeks. Get help right away if:  You cough up blood.  You have chest pain.  You have very bad shortness of breath.  You become dehydrated.  You faint (pass out) or keep feeling like you are going to pass out.  You keep throwing up (vomiting).  You have a very bad headache.  Your fever or chills gets worse. This information is not intended to replace advice given to you by your health care provider. Make sure you discuss any questions you have with your health care provider. Document Released: 10/12/2007 Document Revised: 04/07/2017 Document Reviewed: 10/14/2015 Elsevier Patient Education  2020 [employer].

## 2018-12-19 NOTE — Progress Notes (Signed)
PATIENT [facility] INTERNAL MEDICINE AND SICKLE CELL CARE  SICKLE CELL ANEMIA FOLLOW UP VISIT PROVIDER: Mike GipAndre Blaine Hari, FNP    Subjective:   Lindsey [name]  is a 48 y.o.  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  Patient with a history of opioid dependence who [place]. She was recently seen in the sickle cell infusion center  for sickle cell pain crisis. Was admitted to hospital for further management of pain. Was discharged with oxycodone 5 mg every 4 hours as needed for moderate to severe pain #20.  Gabapentin 300 mg 3 times daily and  ibuprofen 800 mg every 8 hours with food for mild to moderate pain. She is out of narcotic pain medication and states that she is nervous about continuing narcotics at home.  She also states that she has had cough and chest congestion x 3 days.   Review of Systems  Constitutional: Negative.   HENT: Negative.   Eyes: Negative.   Respiratory: Positive for cough and wheezing.   Cardiovascular: Negative.   Gastrointestinal: Negative.   Genitourinary: Negative.   Musculoskeletal: Negative.   Skin: Negative.   Neurological: Negative.   Psychiatric/Behavioral: Negative.     Objective:   Objective  BP (!) 123/53 (BP Location: Left Arm, Patient Position: Sitting, Cuff Size: Normal)   Pulse 71   Temp 98.8 F (37.1 C) (Oral)   Resp 14   Ht 5' (1.524 m)   Wt 159 lb (72.1 kg)   LMP 12/13/2018   SpO2 100%   BMI 31.05 kg/m   Wt Readings from Last 3 Encounters:  12/19/18 159 lb (72.1 kg)  11/22/18 166 lb 10.7 oz (75.6 kg)  07/26/18 165 lb (74.8 kg)     Physical Exam Vitals signs and nursing note reviewed.  Constitutional:      General: She is not in acute distress.    Appearance: Normal appearance.  HENT:     Head: Normocephalic and atraumatic.  Eyes:     Extraocular Movements: Extraocular movements intact.   Conjunctiva/sclera: Conjunctivae normal.     Pupils: Pupils are equal, round, and reactive to light.  Cardiovascular:     Rate and Rhythm: Normal rate and regular rhythm.     Heart sounds: No murmur.  Pulmonary:     Effort: Pulmonary effort is normal.     Breath sounds: Wheezing (expiratory > inspiratory throughout all lung fields. ) present.  Musculoskeletal: Normal range of motion.  Skin:    General: Skin is warm and dry.  Neurological:     Mental Status: She is alert and oriented to person, place, and time.  Psychiatric:        Mood and Affect: Mood normal.        Behavior: Behavior normal.        Thought Content: Thought content normal.        Judgment: Judgment normal.      Assessment/Plan:   Assessment   Encounter Diagnosis  Name Primary?  . Sickle cell anemia with pain (HCC) Yes     Plan  1. Sickle cell anemia with pain (HCC) - Urinalysis Dipstick We discussed only using the narcotic pain medication as needed for pain crisis.  - oxyCODONE (ROXICODONE) 5 MG immediate release tablet; Take 1 tablet (5 mg total) by mouth every 4 (four) hours as needed for severe pain.  Dispense: 20 tablet; Refill: 0 -  CBC with Differential  2. Bronchitis - predniSONE (STERAPRED UNI-PAK 21 TAB) 10 MG (21) TBPK tablet; Take by mouth daily. Take as directed on pack.  Dispense: 21 tablet; Refill: 0 - amoxicillin-clavulanate (AUGMENTIN) 875-125 MG tablet; Take 1 tablet by mouth every 12 (twelve) hours for 7 days.  Dispense: 14 tablet; Refill: 0 - albuterol (VENTOLIN HFA) 108 (90 Base) MCG/ACT inhaler; Inhale 2 puffs into the lungs every 6 (six) hours as needed for wheezing or shortness of breath.  Dispense: 6.7 g; Refill: 0    Return to care as scheduled and prn. Patient verbalized understanding and agreed with plan of care.   1. Sickle cell disease -  We discussed the need for good hydration, monitoring of hydration status, avoidance of heat, cold, stress, and infection triggers. We  discussed the risks and benefits of Hydrea, including bone marrow suppression, the possibility of GI upset, skin ulcers, hair thinning, and teratogenicity. The patient was reminded of the need to seek medical attention of any symptoms of bleeding, anemia, or infection. Continue folic acid 1 mg daily to prevent aplastic bone marrow crises.   2. Pulmonary evaluation - Patient denies severe recurrent wheezes, shortness of breath with exercise, or persistent cough. If these symptoms develop, pulmonary function tests with spirometry will be ordered, and if abnormal, plan on referral to Pulmonology for further evaluation.  3. Cardiac - Routine screening for pulmonary hypertension is not recommended.  4. Eye - High risk of proliferative retinopathy. Annual eye exam with retinal exam recommended to patient.  5. Immunization status -  Yearly influenza vaccination is recommended, as well as being up to date with Meningococcal and Pneumococcal vaccines.   6. Acute and chronic painful episodes - We discussed that pt is to receive Schedule II prescriptions only from [place]. Pt is also aware that the prescription history is available to [place] online through the [facility] CSRS. Controlled substance agreement signed. We reminded Lindsey [name] that all patients receiving Schedule II narcotics must be seen for follow within one month of prescription being requested. We reviewed the terms of our pain agreement, including the need to keep medicines in a safe locked location away from children or pets, and the need to report excess sedation or constipation, measures to avoid constipation, and policies related to early refills and stolen prescriptions. According to the [place] Chronic Pain Initiative program, we have reviewed details related to analgesia, adverse effects, aberrant behaviors.  7. Iron overload from chronic transfusion.  Not applicable at this time.  If this occurs will use Exjade for management.   8. Vitamin D deficiency -  Drisdol 50,000 units weekly. Patient encouraged to take as prescribed.   The above recommendations are taken from the NIH Evidence-Based Management of Sickle Cell Disease: Expert Panel Report, 20149.   Ms. Lindsey L. Nathaneil Canary, FNP-BC [facility] [address] [place], [place] [address] [phone]  This note has been created with Dragon speech recognition software and smart phrase technology. Any transcriptional errors are unintentional.

## 2018-12-20 LAB — CBC WITH DIFFERENTIAL/PLATELET
Basophils Absolute: 0.1 10*3/uL (ref 0.0–0.2)
Basos: 1 %
EOS (ABSOLUTE): 0.5 10*3/uL — ABNORMAL HIGH (ref 0.0–0.4)
Eos: 5 %
Hematocrit: 30.6 % — ABNORMAL LOW (ref 34.0–46.6)
Hemoglobin: 10.2 g/dL — ABNORMAL LOW (ref 11.1–15.9)
Immature Grans (Abs): 0 10*3/uL (ref 0.0–0.1)
Immature Granulocytes: 0 %
Lymphocytes Absolute: 3.2 10*3/uL — ABNORMAL HIGH (ref 0.7–3.1)
Lymphs: 30 %
MCH: 31.5 pg (ref 26.6–33.0)
MCHC: 33.3 g/dL (ref 31.5–35.7)
MCV: 94 fL (ref 79–97)
Monocytes Absolute: 0.8 10*3/uL (ref 0.1–0.9)
Monocytes: 7 %
Neutrophils Absolute: 6.3 10*3/uL (ref 1.4–7.0)
Neutrophils: 57 %
Platelets: 488 10*3/uL — ABNORMAL HIGH (ref 150–450)
RBC: 3.24 x10E6/uL — ABNORMAL LOW (ref 3.77–5.28)
RDW: 15.5 % — ABNORMAL HIGH (ref 11.7–15.4)
WBC: 10.9 10*3/uL — ABNORMAL HIGH (ref 3.4–10.8)

## 2018-12-21 NOTE — Progress Notes (Signed)
Your labs are stable. Continue with your current medications. Please remember to keep your follow up appointment. If you have problems, questions or concerns, please make an appointment to discuss. Thanks!

## 2018-12-27 ENCOUNTER — Other Ambulatory Visit: Payer: Self-pay | Admitting: Family Medicine

## 2018-12-27 DIAGNOSIS — D571 Sickle-cell disease without crisis: Secondary | ICD-10-CM

## 2019-01-07 ENCOUNTER — Telehealth: Payer: Self-pay

## 2019-01-07 DIAGNOSIS — D5700 Hb-SS disease with crisis, unspecified: Secondary | ICD-10-CM

## 2019-01-07 MED ORDER — OXYCODONE HCL 5 MG PO TABS: 5.0000 mg | ORAL_TABLET | ORAL | 0 refills | Status: DC | PRN

## 2019-01-07 NOTE — Telephone Encounter (Signed)
20 pills sent to the pharmacy on file.

## 2019-01-14 ENCOUNTER — Telehealth: Payer: Self-pay | Admitting: Family Medicine

## 2019-01-14 ENCOUNTER — Other Ambulatory Visit: Payer: Self-pay | Admitting: Family Medicine

## 2019-01-14 DIAGNOSIS — D5700 Hb-SS disease with crisis, unspecified: Secondary | ICD-10-CM

## 2019-01-14 MED ORDER — OXYCODONE HCL 5 MG PO TABS: 5.0000 mg | ORAL_TABLET | ORAL | 0 refills | Status: DC | PRN

## 2019-01-14 NOTE — Telephone Encounter (Signed)
Patient called with c/o pain crisis. She had c/o leg pain, with pain level at 5/10. She states that she is traveling from [place] and feels that she dehydrated. She does not want to report to ED today. She denies chest pain or new pain today. She is requesting refill on her pain medication. We will send refill of Oxycodone today. She is advised to report to ED if pain increases.

## 2019-01-21 ENCOUNTER — Other Ambulatory Visit: Payer: Self-pay | Admitting: Family Medicine

## 2019-01-21 DIAGNOSIS — G4700 Insomnia, unspecified: Secondary | ICD-10-CM

## 2019-01-25 ENCOUNTER — Other Ambulatory Visit: Payer: Self-pay | Admitting: Family Medicine

## 2019-01-25 ENCOUNTER — Telehealth: Payer: Self-pay | Admitting: Family Medicine

## 2019-01-25 DIAGNOSIS — D5700 Hb-SS disease with crisis, unspecified: Secondary | ICD-10-CM

## 2019-01-25 MED ORDER — OXYCODONE HCL 5 MG PO TABS: 5.0000 mg | ORAL_TABLET | ORAL | 0 refills | Status: DC | PRN

## 2019-01-25 NOTE — Telephone Encounter (Signed)
Pt was called and informed meds was sent to pharmacy

## 2019-01-29 ENCOUNTER — Other Ambulatory Visit: Payer: Self-pay

## 2019-01-29 ENCOUNTER — Encounter: Payer: Self-pay | Admitting: Family Medicine

## 2019-01-29 ENCOUNTER — Ambulatory Visit (INDEPENDENT_AMBULATORY_CARE_PROVIDER_SITE_OTHER): Payer: Medicaid Other | Admitting: Family Medicine

## 2019-01-29 VITALS — BP 116/68 | HR 73 | Temp 98.9°F | Ht 60.0 in | Wt 155.2 lb

## 2019-01-29 DIAGNOSIS — D5700 Hb-SS disease with crisis, unspecified: Secondary | ICD-10-CM | POA: Diagnosis not present

## 2019-01-29 DIAGNOSIS — Z23 Encounter for immunization: Secondary | ICD-10-CM | POA: Diagnosis not present

## 2019-01-29 DIAGNOSIS — Z9289 Personal history of other medical treatment: Secondary | ICD-10-CM

## 2019-01-29 DIAGNOSIS — F1121 Opioid dependence, in remission: Secondary | ICD-10-CM

## 2019-01-29 DIAGNOSIS — R531 Weakness: Secondary | ICD-10-CM | POA: Diagnosis not present

## 2019-01-29 LAB — POCT URINALYSIS DIPSTICK
Bilirubin, UA: NEGATIVE
Blood, UA: NEGATIVE
Glucose, UA: NEGATIVE
Ketones, UA: NEGATIVE
Nitrite, UA: NEGATIVE
Protein, UA: NEGATIVE
Spec Grav, UA: 1.01 (ref 1.010–1.025)
Urobilinogen, UA: 0.2 E.U./dL
pH, UA: 5 (ref 5.0–8.0)

## 2019-01-29 MED ORDER — OXYCODONE HCL 5 MG PO TABS: 5.0000 mg | ORAL_TABLET | Freq: Four times a day (QID) | ORAL | 0 refills | Status: DC | PRN

## 2019-01-29 NOTE — Patient Instructions (Signed)
I have sent a referral to psychology to discuss addiction and dependence when it comes to opiate medications.  I have sent your medications to your pharmacy as discussed.  For your left-sided weakness, I sent a referral to neurology.  I want to see you in 3 months to recheck your sickle cell.  You more than likely need a referral to hematology being that your sickle cell is changing as you age.  We will discuss further during your next appointment.  Thanks again for coming in.

## 2019-01-29 NOTE — Progress Notes (Signed)
Established Patient Office Visit  Subjective:  Patient ID: Lindsey RectorShaunda Dowland, female    DOB: [birth year]  Age: 48 y.o. MRN: [id number]  CC:  Chief Complaint  Patient presents with  . Follow-up    having shanking in arm and leg  on legs side     HPI Lindsey [name] is a very pleasant 48 year old female with a medical history significant for sickle cell disease, chronic pain syndrome, history of opiate dependence, history of opiate addiction, and history of neuropathy  presents for a follow up of sickle cell anemia and left sided weakness over the past several weeks.  Lindsey [name] is complaining of weakness and tingling primarily to left side.  She noticed symptoms around 3 weeks ago.  She feels as if left upper extremity and lower extremities have grown progressively weaker with occasional shaking.  She started an exercise routine hoping that symptoms would dissipate.  Symptoms are currently of mild severity.  She characterizes as constant.  She denies abnormal gait, recent falls, head injury, history of stroke, dizziness, dysuria, nausea, vomiting, or diarrhea.  She endorses tingling to left fingertips and tremor.  Patient is also following up on sickle cell disease and chronic pain.  Patient's last crisis was 1 month ago, which led to hospitalization.  She states that her pain is primarily to low back and lower extremities.  She is currently not having pain.  She typically takes oxycodone 5 mg every 6 hours.  She states that she has been taking medication more over the past several weeks.  She also says that she takes opiate medications when she is not in pain.  Patient was in rehabilitation for opiates greater than 1 year ago.  She has refrain from opiate use until the last hospital admission.  She says that she has pain daily, which is relieved by 1 oxycodone.  She says that she is still afraid of having pain she often takes another tablet in the evenings to prevent pain.   Patient also endorses periodic  anxiety.  She has difficulty concentrating, some fatigue, and feelings of losing control.  She currently denies suicidal or homicidal ideations.   Past Medical History:  Diagnosis Date  . 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 Opioid dependence in remission (HCC)   . Sickle cell crisis [facility](HCC)     Past Surgical History:  Procedure Laterality Date  . APPENDECTOMY    . CESAREAN SECTION    . OTHER SURGICAL HISTORY     c-section    No family history on file.  Social History   Socioeconomic History  . Marital status: Married    Spouse name: Not on file  . Number of children: Not on file  . Years of education: Not on file  . Highest education level: Not on file  Occupational History  . Not on file  Social Needs  . Financial resource strain: Not on file  . Food insecurity    Worry: Not on file    Inability: Not on file  . Transportation needs    Medical: Not on file    Non-medical: Not on file  Tobacco Use  . Smoking status: Current Some Day Smoker    Packs/day: 0.00    Types: Cigars  . Smokeless tobacco: Never Used  Substance and Sexual Activity  . Alcohol use: Yes    Comment: occasionally  . Drug use: No  . Sexual activity: Yes  Lifestyle  . Physical activity    Days per week: Not  on file    Minutes per session: Not on file  . Stress: Not on file  Relationships  . Social Musician on phone: Not on file    Gets together: Not on file    Attends religious service: Not on file    Active member of club or organization: Not on file    Attends meetings of clubs or organizations: Not on file    Relationship status: Not on file  . Intimate partner violence    Fear of current or ex partner: Not on file    Emotionally abused: Not on file    Physically abused: Not on file    Forced sexual activity: Not on file  Other Topics Concern  . Not on file  Social History Narrative  . Not on file    Outpatient Medications Prior to Visit  Medication Sig  Dispense Refill  . ergocalciferol (DRISDOL) 50000 units capsule Take 1 capsule (50,000 Units total) by mouth once a week. 30 capsule 1  . gabapentin (NEURONTIN) 300 MG capsule TAKE 1 CAPSULE(300 MG) BY MOUTH THREE TIMES DAILY 90 capsule 0  . hydrOXYzine (ATARAX/VISTARIL) 25 MG tablet TAKE 1 TO 2 TABLETS(25 TO 50 MG) BY MOUTH AT BEDTIME 60 tablet 2  . ibuprofen (ADVIL) 800 MG tablet TAKE 1 TABLET(800 MG) BY MOUTH EVERY 8 HOURS AS NEEDED 60 tablet 5  . oxyCODONE (ROXICODONE) 5 MG immediate release tablet Take 1 tablet (5 mg total) by mouth every 4 (four) hours as needed for up to 5 days for severe pain. 20 tablet 0  . albuterol (VENTOLIN HFA) 108 (90 Base) MCG/ACT inhaler Inhale 2 puffs into the lungs every 6 (six) hours as needed for wheezing or shortness of breath. (Patient not taking: Reported on 01/29/2019) 6.7 g 0  . gabapentin (NEURONTIN) 100 MG capsule TAKE 1 CAPSULE BY MOUTH TWICE DAILY FOR 2 WEEKS, THEN 1 EVERY NIGHT AT BEDTIME THEREAFTER, THEN 1 CAPSULE THREE TIMES DAILY WITH CRISIS (Patient not taking: Reported on 01/29/2019) 90 capsule 1  . predniSONE (STERAPRED UNI-PAK 21 TAB) 10 MG (21) TBPK tablet Take by mouth daily. Take as directed on pack. (Patient not taking: Reported on 01/29/2019) 21 tablet 0   No facility-administered medications prior to visit.     No Known Allergies  ROS Review of Systems  Constitutional: Negative.  Negative for chills and diaphoresis.  HENT: Negative.   Eyes: Negative for pain and redness.  Respiratory: Negative.   Cardiovascular: Negative.   Gastrointestinal: Negative.   Genitourinary: Negative.   Musculoskeletal: Negative.   Skin: Negative.  Negative for color change, pallor and rash.  Allergic/Immunologic: Negative.   Neurological: Positive for tremors, weakness and numbness (Fingertips, mostly left hand). Negative for dizziness, seizures, facial asymmetry, speech difficulty and headaches.  Hematological: Negative.   Psychiatric/Behavioral:  Negative.  Negative for suicidal ideas.      Objective:    Physical Exam  Constitutional: She appears well-developed and well-nourished.  HENT:  Head: Normocephalic.  Neck: Normal range of motion.  Cardiovascular: Normal rate and regular rhythm.  Pulmonary/Chest: Effort normal and breath sounds normal.  Abdominal: Soft. Bowel sounds are normal.  Musculoskeletal: Normal range of motion.  Neurological: She is alert. She has normal strength. She displays tremor. No cranial nerve deficit or sensory deficit. Coordination and gait normal.  Skin: Skin is warm and dry.  Psychiatric: She has a normal mood and affect. Her behavior is normal. Judgment and thought content normal.    BP 116/68 (  BP Location: Left Arm, Patient Position: Sitting, Cuff Size: Normal)   Pulse 73   Temp 98.9 F (37.2 C) (Oral)   Ht 5' (1.524 m)   Wt 155 lb 3.2 oz (70.4 kg)   LMP 01/02/2019   SpO2 100%   BMI 30.31 kg/m  Wt Readings from Last 3 Encounters:  01/29/19 155 lb 3.2 oz (70.4 kg)  12/19/18 159 lb (72.1 kg)  11/22/18 166 lb 10.7 oz (75.6 kg)     Lab Results  Component Value Date   WBC 10.9 (H) 12/19/2018   HGB 10.2 (L) 12/19/2018   HCT 30.6 (L) 12/19/2018   MCV 94 12/19/2018   PLT 488 (H) 12/19/2018   Lab Results  Component Value Date   NA 142 11/26/2018   K 3.7 11/26/2018   CO2 26 11/26/2018   GLUCOSE 92 11/26/2018   BUN 8 11/26/2018   CREATININE 0.67 11/26/2018   BILITOT 2.5 (H) 11/26/2018   ALKPHOS 49 11/26/2018   AST 26 11/26/2018   ALT 24 11/26/2018   PROT 6.2 (L) 11/26/2018   ALBUMIN 3.4 (L) 11/26/2018   CALCIUM 8.4 (L) 11/26/2018   ANIONGAP 8 11/26/2018   Lab Results  Component Value Date   CHOL  07/25/2007    119        ATP III CLASSIFICATION:  <200     mg/dL   Desirable  200-239  mg/dL   Borderline High  >=240    mg/dL   High   Lab Results  Component Value Date   HDL 33 (L) 07/25/2007   Lab Results  Component Value Date   [facility]  07/25/2007    78         Total Cholesterol/HDL:CHD Risk Coronary Heart Disease Risk Table                     Men   Women  1/2 Average Risk   3.4   3.3   Lab Results  Component Value Date   TRIG 38 07/25/2007   Lab Results  Component Value Date   CHOLHDL 3.6 07/25/2007   No results found for: HGBA1C    Assessment & Plan:   Problem List Items Addressed This Visit    None    Visit Diagnoses    History of narcotic addiction ([place])    -  Primary   Relevant Orders   Ambulatory referral to Psychology   Left-sided weakness       Relevant Orders   Ambulatory referral to Neurology      History of narcotic addiction [facility]) Patient endorses using opiates at times when she is not in pain.  Due to her history of narcotic addiction, recommend a referral to psychology.  Lindsey and I discussed this referral at length. - Ambulatory referral to Psychology - [phone] 11+Oxyco+Alc+Crt-Bund   Left-sided weakness Patient complaining of left-sided weakness for greater than 3 weeks.  Due to her history of sickle cell disease, I recommend referral to neurology for further work-up and evaluation. - Ambulatory referral to Neurology  Sickle cell anemia with pain ([place]) Lindsey's sickle cell is well controlled.  Patient has only been hospitalized once in the past year for acute sickle cell crisis.  Also, patient has used the sickle cell day infusion center for day treatment of pain crisis.  Will start folic acid 1 mg daily to prevent aplastic bone marrow crises.   Pulmonary evaluation - Patient denies severe recurrent wheezes, shortness of breath with exercise, or persistent  cough. If these symptoms develop, pulmonary function tests with spirometry will be ordered, and if abnormal, plan on referral to Pulmonology for further evaluation.  Cardiac - Routine screening for pulmonary hypertension is not recommended.  Eye - High risk of proliferative retinopathy. Annual eye exam with retinal exam recommended to patient.  We will  send a referral to ophthalmology to rule out sickle cell related retinopathy  Immunization status - Patient will receive influenza vaccination on today  Acute and chronic painful episodes -Dois Davenport and I discussed pain medication regimen at length.  Unfortunately, patient has chronic pain in the setting of sickle cell disease.  Of sickle cell disease is pain.  On occasion, patient will warrant opiate medications for greater pain control.  We agreed on oxycodone 5 mg every 6 hours as needed for severe pain.  She will receive 30 pills/month.  She has agreed to check him monthly for medication management.  Also, she has not discussed pain medication agreement.  She is aware that I will review PDMP substance reporting system prior to prescribing opiate medications.  We discussed that pt is to receive her Schedule II prescriptions only from [place]. We reviewed the terms of our pain agreement, including the need to keep medicines in a safe locked location away from children or pets, and the need to report excess sedation or constipation, measures to avoid constipation, and policies related to early refills and stolen prescriptions. According to the [place] Chronic Pain Initiative program, we have reviewed details related to analgesia, adverse effects, aberrant behaviors.  Reviewed PDMP on today, no inconsistencies noted  - Urinalysis Dipstick - oxyCODONE (ROXICODONE) 5 MG immediate release tablet; Take 1 tablet (5 mg total) by mouth every 6 (six) hours as needed for up to 5 days for severe pain.  Dispense: 30 tablet; Refill: 0 - gabapentin (NEURONTIN) 300 MG capsule; TAKE 1 CAPSULE(300 MG) BY MOUTH THREE TIMES DAILY  Dispense: 90 capsule; Refill: 1  Flu vaccine need - Flu Vaccine QUAD 36+ mos IM (Fluarix & Fluzone Quad PF   History of urine culture Urine leukocytes - Urine Culture   Follow-up: Return in about 3 months (around 04/30/2019) for sickle cell anemia.    [name]  APRN, MSN, FNP-C Patient  [facility] [address] [place], [place] [address] [phone]

## 2019-01-31 MED ORDER — GABAPENTIN 300 MG PO CAPS: ORAL_CAPSULE | ORAL | 1 refills | Status: DC

## 2019-01-31 MED ORDER — OXYCODONE HCL 5 MG PO TABS: 5.0000 mg | ORAL_TABLET | Freq: Four times a day (QID) | ORAL | 0 refills | Status: AC | PRN

## 2019-02-01 LAB — DRUG SCREEN 764883 11+OXYCO+ALC+CRT-BUND
Amphetamines, Urine: NEGATIVE ng/mL
BENZODIAZ UR QL: NEGATIVE ng/mL
Barbiturate: NEGATIVE ng/mL
Cannabinoid Quant, Ur: NEGATIVE ng/mL
Cocaine (Metabolite): NEGATIVE ng/mL
Creatinine: 94.4 mg/dL (ref 20.0–300.0)
Ethanol: NEGATIVE %
Meperidine: NEGATIVE ng/mL
Methadone Screen, Urine: NEGATIVE ng/mL
OPIATE SCREEN URINE: NEGATIVE ng/mL
Phencyclidine: NEGATIVE ng/mL
Propoxyphene: NEGATIVE ng/mL
Tramadol: NEGATIVE ng/mL
pH, Urine: 5 (ref 4.5–8.9)

## 2019-02-01 LAB — OXYCODONE/OXYMORPHONE, CONFIRM
OXYCODONE/OXYMORPH: POSITIVE — AB
OXYCODONE: 661 ng/mL
OXYCODONE: POSITIVE — AB
OXYMORPHONE (GC/MS): 446 ng/mL
OXYMORPHONE: POSITIVE — AB

## 2019-02-06 LAB — SPECIMEN STATUS REPORT

## 2019-02-07 ENCOUNTER — Other Ambulatory Visit: Payer: Self-pay | Admitting: Family Medicine

## 2019-02-07 DIAGNOSIS — D5700 Hb-SS disease with crisis, unspecified: Secondary | ICD-10-CM

## 2019-02-13 ENCOUNTER — Telehealth: Payer: Self-pay

## 2019-02-13 MED ORDER — OXYCODONE HCL 5 MG PO TABS: 5.0000 mg | ORAL_TABLET | Freq: Four times a day (QID) | ORAL | 0 refills | Status: AC | PRN

## 2019-02-13 NOTE — Telephone Encounter (Signed)
Patient called in today to ask for a refill on Oxycodone 5mg . She states she has been having pain since yesterday and ibuprofen is not helping it. Please advise. Thank you!

## 2019-02-13 NOTE — Telephone Encounter (Signed)
Reviewed PDMP prior to prescribing, no inconsistencies noted.   Meds ordered this encounter  Medications  . oxyCODONE (OXY IR/ROXICODONE) 5 MG immediate release tablet    Sig: Take 1 tablet (5 mg total) by mouth every 6 (six) hours as needed for up to 15 days for severe pain.    Dispense:  30 tablet    Refill:  0    Order Specific Question:   Supervising Provider    Answer:   Tresa Garter [[id number]]     [name]  APRN, MSN, FNP-C [facility] [address] [place], [place] [address] [phone]

## 2019-02-19 NOTE — Progress Notes (Signed)
[facility]    Provider:  Dr Lucia GaskinsAhern Requesting Provider: Julianne HandlerLachina Hollis FNP Primary Care Provider:  Quentin AngstJegede, Olugbemiga E, MD  CC:  Left sided weakness  HPI:  Lindsey [name] is a 48 y.o. female here as requested by Quentin AngstJegede, Olugbemiga E, MD for left-sided weakness. Past medical history sickle cell disease, chronic pain syndrome, opiate dependence and addiction, neuropathy. She was walking in the park trying to exercise 2 months ago when she got in the car her left side started shaking, became weak, she had to grab hold of the car. When she wakes up in the morning both hands shake. No left-sided weakness for a month. She says it is more th shaking. Just in the morning when she gets up, symmetric, both hands, it doesn't interfere with her doing anything, she has coffee and maybe it goes away. She goes walking and she will just shaking. No cinfusion, face not involved, now no weakness. She doesn't eat in the morning at all before walking, she doesn't eat breakfast. It only happens after she walks. No other focal neurologic deficits, associated symptoms, inciting events or modifiable factors.  Reviewed notes, labs and imaging from outside physicians, which showed:  I reviewed Julianne HandlerLachina Hollis FNP's notes.  Patient was seen recently for left-sided weakness for several weeks.  Weakness and tingling primarily to the left side.  Symptoms started 3 weeks prior, last seen January 29, 2019.  She feels as if her left upper extremity and lower extremities have grown progressively weaker with occasional shaking.  She exercised hoping the symptoms would resolve.  She reported mild severity, constant, no abnormal gait, no head injury, no history of stroke, no dizziness, dysuria, nausea, vomiting with diarrhea, endorses tingling to the left fingertips and tremor.  Patient's last crisis was in August 2020 and led to hospitalization.  Primarily pain in the low back and lower extremities.  She takes  oxycodone 5 mg every 6 hours.  Patient was in rehabilitation for opiate dependence about a year prior.  Also anxiety, difficulty concentrating, fatigue.  CBC in August 2020 showed elevated white blood cells, anemia, and slightly elevated platelets.  CMP in July 2020 was unremarkable.   Review of Systems: Patient complains of symptoms per HPI as well as the following symptoms: sickle cell. Pertinent negatives and positives per HPI. All others negative.   Social History   Socioeconomic History  . Marital status: Married    Spouse name: Not on file  . Number of children: 2  . Years of education: Not on file  . Highest education level: Not on file  Occupational History  . Not on file  Social Needs  . Financial resource strain: Not on file  . Food insecurity    Worry: Not on file    Inability: Not on file  . Transportation needs    Medical: Not on file    Non-medical: Not on file  Tobacco Use  . Smoking status: Current Some Day Smoker    Packs/day: 0.00    Types: Cigars  . Smokeless tobacco: Never Used  Substance and Sexual Activity  . Alcohol use: Yes    Comment: occasionally  . Drug use: No  . Sexual activity: Yes    Birth control/protection: None  Lifestyle  . Physical activity    Days per week: Not on file    Minutes per session: Not on file  . Stress: Not on file  Relationships  . Social Musicianconnections    Talks on phone: Not  on file    Gets together: Not on file    Attends religious service: Not on file    Active member of club or organization: Not on file    Attends meetings of clubs or organizations: Not on file    Relationship status: Not on file  . Intimate partner violence    Fear of current or ex partner: Not on file    Emotionally abused: Not on file    Physically abused: Not on file    Forced sexual activity: Not on file  Other Topics Concern  . Not on file  Social History Narrative   Lives at home with husband and son   Right handed    Family History   Problem Relation Age of Onset  . Stroke Neg Hx        none that she knows of     Past Medical History:  Diagnosis Date  . 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 Opioid dependence in remission (HCC)   . Sickle cell crisis [facility])     Patient Active Problem List   Diagnosis Date Noted  . Opioid dependence in remission (HCC) 10/01/2017  . Sickle cell disease (HCC) 10/01/2017  . Chronic pain 10/01/2017  . 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 Leukocytosis 09/03/2016  . Sickle cell pain crisis (HCC) 07/20/2012  . 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 APPENDECTOMY    . CESAREAN SECTION    . OTHER SURGICAL HISTORY     c-section    Current Outpatient Medications  Medication Sig Dispense Refill  . ergocalciferol (DRISDOL) 50000 units capsule Take 1 capsule (50,000 Units total) by mouth once a week. 30 capsule 1  . gabapentin (NEURONTIN) 300 MG capsule TAKE 1 CAPSULE(300 MG) BY MOUTH THREE TIMES DAILY 90 capsule 1  . hydrOXYzine (ATARAX/VISTARIL) 25 MG tablet TAKE 1 TO 2 TABLETS(25 TO 50 MG) BY MOUTH AT BEDTIME 60 tablet 2  . ibuprofen (ADVIL) 800 MG tablet TAKE 1 TABLET(800 MG) BY MOUTH EVERY 8 HOURS AS NEEDED 60 tablet 5  . oxyCODONE (OXY IR/ROXICODONE) 5 MG immediate release tablet Take 1 tablet (5 mg total) by mouth every 6 (six) hours as needed for up to 15 days for severe pain. 30 tablet 0  . albuterol (VENTOLIN HFA) 108 (90 Base) MCG/ACT inhaler Inhale 2 puffs into the lungs every 6 (six) hours as needed for wheezing or shortness of breath. (Patient not taking: Reported on 01/29/2019) 6.7 g 0   No current facility-administered medications for this visit.     Allergies as of 02/20/2019  . (No Known Allergies)    Vitals: BP 104/76 (BP Location: Right Arm, Patient Position: Sitting)   Pulse 76   Temp (!) 97.5 F (36.4 C) Comment: taken at front door  Ht 5' (1.524 m)   Wt 155 lb (70.3 kg) Comment: pt  reported  BMI 30.27 kg/m  Last Weight:  Wt Readings from Last 1 Encounters:  02/20/19 155 lb (70.3 kg)   Last Height:   Ht Readings from Last 1 Encounters:  02/20/19 5' (1.524 m)     Physical exam: Exam: Gen: NAD, conversant, well nourised, well groomed                     CV: RRR, no MRG. No Carotid Bruits. No peripheral edema, warm, nontender Eyes: Conjunctivae clear without exudates or hemorrhage  Neuro: Detailed Neurologic Exam  Speech:    Speech is normal; fluent and spontaneous with normal comprehension.  Cognition:    The patient is oriented to person, place, and time;     recent and remote memory intact;     language fluent;     normal attention, concentration,     fund of knowledge Cranial Nerves:    The pupils are equal, round, and reactive to light. The fundi are normal and spontaneous venous pulsations are present. Visual fields are full to finger confrontation. Extraocular movements are intact. Trigeminal sensation is intact and the muscles of mastication are normal. The face is symmetric. The palate elevates in the midline. Hearing intact. Voice is normal. Shoulder shrug is normal. The tongue has normal motion without fasciculations.   Coordination:    Normal finger to nose and heel to shin. Normal rapid alternating movements.   Gait:    Heel-toe and tandem gait are normal.   Motor Observation:    No asymmetry, no atrophy, and no involuntary movements noted. Tone:    Normal muscle tone.    Posture:    Posture is normal. normal erect    Strength:    Strength is V/V in the upper and lower limbs.      Sensation: intact to LT, neg romberg.     Reflex Exam:  DTR's:    Deep tendon reflexes in the upper and lower extremities are normal bilaterally.   Toes:    The toes are downgoing bilaterally.   Clonus:    Clonus is absent.    Assessment/Plan:  Very nice patient. She had several incidents of left-sided weakness and tremors aftre walking in the  mornings. But she does not eat in the morning, this may be low glucose. Advised her to eat. Today exam is normal no tremor (she had some sugar in her coffee and that helps). Given her history of sickle cell need to check for strokes, unlikely seizures but will check for seizure focus, also check tsh and glucose this morning.  Orders Placed This Encounter  Procedures  . MR BRAIN W WO CONTRAST  . TSH  . Glucose, Random    Cc: Tresa Garter, MD,  [name] FNP   Sarina Ill, MD  [facility] [address] [place], [place] [address]  Phone [phone] Fax [phone]

## 2019-02-20 ENCOUNTER — Telehealth: Payer: Self-pay | Admitting: Neurology

## 2019-02-20 ENCOUNTER — Ambulatory Visit: Payer: Self-pay | Admitting: Neurology

## 2019-02-20 ENCOUNTER — Encounter: Payer: Self-pay | Admitting: Neurology

## 2019-02-20 ENCOUNTER — Other Ambulatory Visit: Payer: Self-pay

## 2019-02-20 VITALS — BP 104/76 | HR 76 | Temp 97.5°F | Ht 60.0 in | Wt 155.0 lb

## 2019-02-20 DIAGNOSIS — R259 Unspecified abnormal involuntary movements: Secondary | ICD-10-CM

## 2019-02-20 DIAGNOSIS — R251 Tremor, unspecified: Secondary | ICD-10-CM

## 2019-02-20 DIAGNOSIS — R531 Weakness: Secondary | ICD-10-CM

## 2019-02-20 NOTE — Telephone Encounter (Signed)
self pay order sent to GI. They will reach out to the patient to schedule.  °

## 2019-02-20 NOTE — Patient Instructions (Signed)
Labs MRI of the brain w/wo contrast Make sure you eat   Tremor A tremor is trembling or shaking that you cannot control. Most tremors affect the hands or arms. Tremors can also affect the head, vocal cords, face, and other parts of the body. There are many types of tremors. Common types include:  Essential tremor. These usually occur in people older than 40. It may run in families and can happen in otherwise healthy people.  Resting tremor. These occur when the muscles are at rest, such as when your hands are resting in your lap. People with Parkinson's disease often have resting tremors.  Postural tremor. These occur when you try to hold a pose, such as keeping your hands outstretched.  Kinetic tremor. These occur during purposeful movement, such as trying to touch a finger to your nose.  Task-specific tremor. These may occur when you perform certain tasks such as writing, speaking, or standing.  Psychogenic tremor. These dramatically lessen or disappear when you are distracted. They can happen in people of all ages. Some types of tremors have no known cause. Tremors can also be a symptom of nervous system problems (neurological disorders) that may occur with aging. Some tremors go away with treatment, while others do not. Follow these instructions at home: Lifestyle      Limit alcohol intake to no more than 1 drink a day for nonpregnant women and 2 drinks a day for men. One drink equals 12 oz of beer, 5 oz of wine, or 1 oz of hard liquor.  Do not use any products that contain nicotine or tobacco, such as cigarettes and e-cigarettes. If you need help quitting, ask your health care provider.  Avoid extreme heat and extreme cold.  Limit your caffeine intake, as told by your health care provider.  Try to get 8 hours of sleep each night.  Find ways to manage your stress, such as meditation or yoga. General instructions  Take over-the-counter and prescription medicines only as  told by your health care provider.  Keep all follow-up visits as told by your health care provider. This is important. Contact a health care provider if you:  Develop a tremor after starting a new medicine.  Have a tremor along with other symptoms such as: ? Numbness. ? Tingling. ? Pain. ? Weakness.  Notice that your tremor gets worse.  Notice that your tremor interferes with your day-to-day life. Summary  A tremor is trembling or shaking that you cannot control.  Most tremors affect the hands or arms.  Some types of tremors have no known cause. Others may be a symptom of nervous system problems (neurological disorders).  Make sure you discuss any tremors you have with your health care provider. This information is not intended to replace advice given to you by your health care provider. Make sure you discuss any questions you have with your health care provider. Document Released: 04/15/2002 Document Revised: 04/07/2017 Document Reviewed: 02/23/2017 Elsevier Patient Education  2020 [employer].

## 2019-02-21 LAB — TSH: TSH: 2.48 u[IU]/mL (ref 0.450–4.500)

## 2019-02-21 LAB — GLUCOSE, RANDOM: Glucose: 91 mg/dL (ref 65–99)

## 2019-02-26 ENCOUNTER — Telehealth: Payer: Self-pay | Admitting: *Deleted

## 2019-02-26 NOTE — Telephone Encounter (Signed)
Pt aware labs normal. She verbalized appreciation. Had no questions.

## 2019-02-26 NOTE — Telephone Encounter (Signed)
-----   Message from Melvenia Beam, MD sent at 02/24/2019  7:17 PM EDT ----- Labs normal

## 2019-02-28 ENCOUNTER — Non-Acute Institutional Stay (HOSPITAL_COMMUNITY)
Admission: AD | Admit: 2019-02-28 | Discharge: 2019-02-28 | Disposition: A | Discharge status: Home / Self Care | Payer: Medicaid Other | Source: Ambulatory Visit | Attending: Internal Medicine | Admitting: Internal Medicine

## 2019-02-28 DIAGNOSIS — Z79899 Other long term (current) drug therapy: Secondary | ICD-10-CM | POA: Insufficient documentation

## 2019-02-28 DIAGNOSIS — D638 Anemia in other chronic diseases classified elsewhere: Secondary | ICD-10-CM | POA: Insufficient documentation

## 2019-02-28 DIAGNOSIS — F1120 Opioid dependence, uncomplicated: Secondary | ICD-10-CM | POA: Insufficient documentation

## 2019-02-28 DIAGNOSIS — G894 Chronic pain syndrome: Secondary | ICD-10-CM | POA: Diagnosis not present

## 2019-02-28 DIAGNOSIS — F320 Major depressive disorder, single episode, mild: Secondary | ICD-10-CM | POA: Diagnosis not present

## 2019-02-28 DIAGNOSIS — D5700 Hb-SS disease with crisis, unspecified: Secondary | ICD-10-CM | POA: Diagnosis present

## 2019-02-28 DIAGNOSIS — F17210 Nicotine dependence, cigarettes, uncomplicated: Secondary | ICD-10-CM | POA: Insufficient documentation

## 2019-02-28 DIAGNOSIS — F329 Major depressive disorder, single episode, unspecified: Secondary | ICD-10-CM | POA: Diagnosis not present

## 2019-02-28 LAB — COMPREHENSIVE METABOLIC PANEL
ALT: 13 U/L (ref 0–44)
AST: 16 U/L (ref 15–41)
Albumin: 4.2 g/dL (ref 3.5–5.0)
Alkaline Phosphatase: 37 U/L — ABNORMAL LOW (ref 38–126)
Anion gap: 7 (ref 5–15)
BUN: 8 mg/dL (ref 6–20)
CO2: 22 mmol/L (ref 22–32)
Calcium: 9 mg/dL (ref 8.9–10.3)
Chloride: 110 mmol/L (ref 98–111)
Creatinine, Ser: 0.89 mg/dL (ref 0.44–1.00)
GFR calc Af Amer: >60 mL/min (ref >60)
GFR calc non Af Amer: >60 mL/min (ref >60)
Glucose, Bld: 100 mg/dL — ABNORMAL HIGH (ref 70–99)
Potassium: 4.2 mmol/L (ref 3.5–5.1)
Sodium: 139 mmol/L (ref 135–145)
Total Bilirubin: 1.3 mg/dL — ABNORMAL HIGH (ref 0.3–1.2)
Total Protein: 7.3 g/dL (ref 6.5–8.1)

## 2019-02-28 LAB — CBC WITH DIFFERENTIAL/PLATELET
Abs Immature Granulocytes: 0.02 10*3/uL (ref 0.00–0.07)
Basophils Absolute: 0.1 10*3/uL (ref 0.0–0.1)
Basophils Relative: 1 %
Eosinophils Absolute: 0.4 10*3/uL (ref 0.0–0.5)
Eosinophils Relative: 4 %
HCT: 29 % — ABNORMAL LOW (ref 36.0–46.0)
Hemoglobin: 10.8 g/dL — ABNORMAL LOW (ref 12.0–15.0)
Immature Granulocytes: 0 %
Lymphocytes Relative: 29 %
Lymphs Abs: 2.6 10*3/uL (ref 0.7–4.0)
MCH: 31.5 pg (ref 26.0–34.0)
MCHC: 37.2 g/dL — ABNORMAL HIGH (ref 30.0–36.0)
MCV: 84.5 fL (ref 80.0–100.0)
Monocytes Absolute: 0.6 10*3/uL (ref 0.1–1.0)
Monocytes Relative: 6 %
Neutro Abs: 5.4 10*3/uL (ref 1.7–7.7)
Neutrophils Relative %: 60 %
Platelets: 454 10*3/uL — ABNORMAL HIGH (ref 150–400)
RBC: 3.43 MIL/uL — ABNORMAL LOW (ref 3.87–5.11)
RDW: 14.2 % (ref 11.5–15.5)
WBC: 9 10*3/uL (ref 4.0–10.5)
nRBC: 0.2 % (ref 0.0–0.2)

## 2019-02-28 LAB — RETICULOCYTES
Immature Retic Fract: 18.7 % — ABNORMAL HIGH (ref 2.3–15.9)
RBC.: 3.43 MIL/uL — ABNORMAL LOW (ref 3.87–5.11)
Retic Count, Absolute: 111.5 10*3/uL (ref 19.0–186.0)
Retic Ct Pct: 3.3 % — ABNORMAL HIGH (ref 0.4–3.1)

## 2019-02-28 LAB — URINALYSIS, COMPLETE (UACMP) WITH MICROSCOPIC
Bilirubin Urine: NEGATIVE
Glucose, UA: NEGATIVE mg/dL
Hgb urine dipstick: NEGATIVE
Ketones, ur: NEGATIVE mg/dL
Nitrite: NEGATIVE
Protein, ur: NEGATIVE mg/dL
Specific Gravity, Urine: 1.01 (ref 1.005–1.030)
pH: 5 (ref 5.0–8.0)

## 2019-02-28 LAB — PREGNANCY, URINE: Preg Test, Ur: NEGATIVE

## 2019-02-28 LAB — RAPID URINE DRUG SCREEN, HOSP PERFORMED
Amphetamines: NOT DETECTED
Barbiturates: NOT DETECTED
Benzodiazepines: POSITIVE — AB
Cocaine: NOT DETECTED
Opiates: NOT DETECTED
Tetrahydrocannabinol: NOT DETECTED

## 2019-02-28 MED ORDER — DULOXETINE HCL 20 MG PO CPEP: 20.0000 mg | ORAL_CAPSULE | Freq: Every day | ORAL | 2 refills | Status: DC

## 2019-02-28 MED ORDER — OXYCODONE HCL 5 MG PO TABS
10.0000 mg | ORAL_TABLET | ORAL | Status: DC | PRN
  Administered 2019-02-28: 14:00:00 10 mg via ORAL
  Filled 2019-02-28: qty 2

## 2019-02-28 MED ORDER — KETOROLAC TROMETHAMINE 30 MG/ML IJ SOLN
15.0000 mg | Freq: Once | INTRAMUSCULAR | Status: AC
  Administered 2019-02-28: 15 mg via INTRAVENOUS
  Filled 2019-02-28: qty 1

## 2019-02-28 MED ORDER — HYDROMORPHONE HCL 1 MG/ML IJ SOLN
0.5000 mg | Freq: Once | INTRAMUSCULAR | Status: AC
  Administered 2019-02-28: 0.5 mg via INTRAVENOUS
  Filled 2019-02-28: qty 1

## 2019-02-28 MED ORDER — DEXTROSE-NACL 5-0.45 % IV SOLN
INTRAVENOUS | Status: DC
  Administered 2019-02-28: 09:00:00 via INTRAVENOUS

## 2019-02-28 MED ORDER — ACETAMINOPHEN 500 MG PO TABS
1000.0000 mg | ORAL_TABLET | Freq: Once | ORAL | Status: AC
  Administered 2019-02-28: 1000 mg via ORAL
  Filled 2019-02-28: qty 2

## 2019-02-28 NOTE — Discharge Summary (Signed)
[facility] Discharge Summary   Patient ID: Lindsey [name] MRN: [id number] DOB/AGE: Dec 11, 1970 48 y.o.  Admit date: 02/28/2019 Discharge date: 03/01/2019  Primary Care Physician:  Quentin Angst, MD  Admission Diagnoses:  Active Problems:   Sickle cell crisis [facility])    Discharge Medications:  Allergies as of 02/28/2019   No Known Allergies     Medication List    TAKE these medications   albuterol 108 (90 Base) MCG/ACT inhaler Commonly known as: VENTOLIN HFA Inhale 2 puffs into the lungs every 6 (six) hours as needed for wheezing or shortness of breath.   DULoxetine 20 MG capsule Commonly known as: Cymbalta Take 1 capsule (20 mg total) by mouth daily.   ergocalciferol 1.25 MG (50000 UT) capsule Commonly known as: Drisdol Take 1 capsule (50,000 Units total) by mouth once a week.   gabapentin 300 MG capsule Commonly known as: NEURONTIN TAKE 1 CAPSULE(300 MG) BY MOUTH THREE TIMES DAILY   hydrOXYzine 25 MG tablet Commonly known as: ATARAX/VISTARIL TAKE 1 TO 2 TABLETS(25 TO 50 MG) BY MOUTH AT BEDTIME   ibuprofen 800 MG tablet Commonly known as: ADVIL TAKE 1 TABLET(800 MG) BY MOUTH EVERY 8 HOURS AS NEEDED     ASK your doctor about these medications   oxyCODONE 5 MG immediate release tablet Commonly known as: Oxy IR/ROXICODONE Take 1 tablet (5 mg total) by mouth every 6 (six) hours as needed for up to 15 days for severe pain. Ask about: Should I take this medication?        Consults:  None  Significant Diagnostic Studies:  No results found.  History of present illness: Lindsey [name], a 49 year old female with a medical history significant for sickle cell disease, chronic pain syndrome, opiate dependence, history of opiate addiction in remission, history of vitamin D deficiency, major depression, left-sided weakness, and anemia of chronic disease presents complaining of pain to left lower extremity and "feeling jittery".  Patient states the  pain intensity has been increased over the past several days and has been unrelieved by home oxycodone.  Patient last had oxycodone last night without sustained relief.  She also endorses left-sided weakness and left lower extremity pain characterized as intermittent and throbbing.  Current pain intensity is 5/10.  Patient also states that she has been "feeling jittery" lately.  She also endorses depression, feelings of hopelessness, anhedonia, and difficulty concentrating.  Patient denies suicidal or homicidal ideations.  Patient is very tearful.  She currently denies headache, blurry vision, dizziness, chest pain, nausea, vomiting, or diarrhea.  Patient also denies fever, chills, sick contacts, recent travel, or exposure to COVID-19. [facility] Course: Patient was admitted to sickle cell day infusion center for management of pain crisis. Reviewed all laboratory values.  Hemoglobin 10.8, consistent with patient's baseline.  No clinical indication for blood transfusion.  All of the laboratory values are consistent with patient's baseline. Patient afebrile and maintaining oxygen saturation at 100% on RA. Reviewed EKG, unremarkable. Pain managed with IV Dilaudid 0.5 mg x 1 dose Oxycodone 10 mg every 3 hours as needed IV fluids, D5 0.45% saline at 125 mL/h Tylenol 1000 mg by mouth x1 Toradol 15 mg IV times pain intensity decreased to 1/10.  Patient states that she can manage at home on current medication regiment.  Patient and I discussed major depression disorder at length.  Patient is no longer on medications for this problem.  Started Cymbalta 20 mg daily for major depression.  Patient will follow-up with  this provider in clinic in 2 weeks for medication management. Patient alert, oriented, and ambulating without assistance. She will discharge home in a hemodynamically stable condition.  Physical Exam at Discharge:  BP (!) 112/52 (BP Location: Right Arm)   Pulse 81   Temp 98.9 F  (37.2 C) (Oral)   Resp 16   LMP 02/09/2019   SpO2 100%  Physical Exam Constitutional:      General: She is not in acute distress. Eyes:     Pupils: Pupils are equal, round, and reactive to light.  Cardiovascular:     Rate and Rhythm: Normal rate and regular rhythm.  Pulmonary:     Effort: Pulmonary effort is normal.     Breath sounds: Normal breath sounds.  Abdominal:     General: Bowel sounds are normal.     Palpations: Abdomen is soft.  Neurological:     General: No focal deficit present.     Mental Status: She is alert. Mental status is at baseline.     Motor: Weakness present.  Psychiatric:        Mood and Affect: Mood is depressed.     Disposition at Discharge: Discharge disposition: 01-Home or Self Care       Discharge Orders: Discharge Instructions    Discharge patient   Complete by: As directed    Discharge disposition: 01-Home or Self Care   Discharge patient date: 02/28/2019      Condition at Discharge:   Stable  Time spent on Discharge:  Greater than 30 minutes.  Signed: [name]  APRN, MSN, FNP-C [facility] [address] [place], [place] [address] [phone]  03/01/2019, 7:14 AM    This note was prepared using Dragon speech recognition software, errors in dictation are unintentional.

## 2019-02-28 NOTE — H&P (Signed)
[facility] History and Physical   Date: 02/28/2019  Patient name: Lindsey [name] Medical record number: [id number] Date of birth: 1971/03/25 Age: 48 y.o. Gender: female PCP: Quentin Angst, MD  Attending physician: Quentin Angst, MD  Chief Complaint:   History of Present Illness: Lindsey [name], a 48 year old female with a medical history significant for sickle cell disease, chronic pain syndrome, opiate dependence, history of opiate addiction, history of vitamin D deficiency, major depression, left-sided weakness, and anemia of chronic disease presents complaining of pain to left lower extremity and "feeling jittery".  Patient states that pain intensity has been increased over the past several days and is been unrelieved by home oxycodone.  Patient last had oxycodone last night without sustained relief.  She also endorses left-sided weakness and left lower extremity pain characterized as intermittent and throbbing.  Current pain intensity 5/10.  Patient also states that she has been "feeling jittery" lately.  She also endorses depression, feelings of hopelessness, anhedonia, and difficulty concentrating.  Patient is very tearful.  She currently denies headache, blurry vision, dizziness, chest pains, nausea, vomiting, or diarrhea.  Patient also denies fever, chills, sick contacts, recent travel, or exposure to COVID-19.   Meds: Medications Prior to Admission  Medication Sig Dispense Refill Last Dose  . albuterol (VENTOLIN HFA) 108 (90 Base) MCG/ACT inhaler Inhale 2 puffs into the lungs every 6 (six) hours as needed for wheezing or shortness of breath. (Patient not taking: Reported on 01/29/2019) 6.7 g 0   . ergocalciferol (DRISDOL) 50000 units capsule Take 1 capsule (50,000 Units total) by mouth once a week. 30 capsule 1   . gabapentin (NEURONTIN) 300 MG capsule TAKE 1 CAPSULE(300 MG) BY MOUTH THREE TIMES DAILY 90 capsule 1   . hydrOXYzine (ATARAX/VISTARIL) 25 MG tablet  TAKE 1 TO 2 TABLETS(25 TO 50 MG) BY MOUTH AT BEDTIME 60 tablet 2   . ibuprofen (ADVIL) 800 MG tablet TAKE 1 TABLET(800 MG) BY MOUTH EVERY 8 HOURS AS NEEDED 60 tablet 5   . oxyCODONE (OXY IR/ROXICODONE) 5 MG immediate release tablet Take 1 tablet (5 mg total) by mouth every 6 (six) hours as needed for up to 15 days for severe pain. 30 tablet 0     Allergies: Patient has no known allergies. Past Medical History:  Diagnosis Date  . 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 Opioid dependence in remission (HCC)   . Sickle cell crisis [facility])    Past Surgical History:  Procedure Laterality Date  . APPENDECTOMY    . CESAREAN SECTION    . OTHER SURGICAL HISTORY     c-section   Family History  Problem Relation Age of Onset  . Stroke Neg Hx        none that she knows of   . Seizures Neg Hx    Social History   Socioeconomic History  . Marital status: Married    Spouse name: Not on file  . Number of children: 2  . Years of education: Not on file  . Highest education level: Not on file  Occupational History  . Not on file  Social Needs  . Financial resource strain: Not on file  . Food insecurity    Worry: Not on file    Inability: Not on file  . Transportation needs    Medical: Not on file    Non-medical: Not on file  Tobacco Use  . Smoking status: Current Some Day Smoker    Packs/day: 0.00  Types: Cigars  . Smokeless tobacco: Never Used  Substance and Sexual Activity  . Alcohol use: Yes    Comment: occasionally  . Drug use: No  . Sexual activity: Yes    Birth control/protection: None  Lifestyle  . Physical activity    Days per week: Not on file    Minutes per session: Not on file  . Stress: Not on file  Relationships  . Social Musicianconnections    Talks on phone: Not on file    Gets together: Not on file    Attends religious service: Not on file    Active member of club or organization: Not on file    Attends meetings of clubs or organizations: Not on file     Relationship status: Not on file  . Intimate partner violence    Fear of current or ex partner: Not on file    Emotionally abused: Not on file    Physically abused: Not on file    Forced sexual activity: Not on file  Other Topics Concern  . Not on file  Social History Narrative   Lives at home with husband and son   Right handed   Review of Systems  Constitutional: Positive for malaise/fatigue.  HENT: Negative.   Eyes: Negative.   Respiratory: Negative.  Negative for sputum production.   Cardiovascular: Negative.   Gastrointestinal: Negative.   Genitourinary: Negative.   Musculoskeletal: Positive for joint pain.  Skin: Negative.   Neurological: Positive for tingling ("jittery") and focal weakness (Left side).  Endo/Heme/Allergies: Negative.   Psychiatric/Behavioral: Substance abuse:   The patient is nervous/anxious.      Physical Exam: Blood pressure 114/74, pulse 82, temperature 99.1 F (37.3 C), temperature source Oral, SpO2 100 %. Physical Exam Constitutional:      General: She is not in acute distress.    Appearance: Normal appearance. She is ill-appearing.  HENT:     Nose: Nose normal.     Mouth/Throat:     Mouth: Mucous membranes are moist.  Eyes:     Pupils: Pupils are equal, round, and reactive to light.  Cardiovascular:     Rate and Rhythm: Normal rate and regular rhythm.  Pulmonary:     Effort: Pulmonary effort is normal.     Breath sounds: Normal breath sounds.  Abdominal:     General: Abdomen is flat. Bowel sounds are normal.  Musculoskeletal: Normal range of motion.  Skin:    General: Skin is warm.  Neurological:     General: No focal deficit present.     Mental Status: She is alert. Mental status is at baseline.     Motor: Weakness present.     Gait: Gait normal.  Psychiatric:        Attention and Perception: She is attentive. She does not perceive auditory or visual hallucinations.        Mood and Affect: Mood is anxious. Affect is tearful.         Behavior: Behavior is not aggressive or hyperactive.        Thought Content: Thought content is not paranoid or delusional. Thought content does not include homicidal ideation. Thought content does not include suicidal plan.      Lab results: No results found for this or any previous visit (from the past 24 hour(s)).  Imaging results:  No results found.   Assessment & Plan: Shaunda admitted to sickle cell day infusion center for management of pain crisis.  Patient opiate tolerant. IV fluids, D5  0.45% saline at 125 mL/h Toradol 15 mg IV x1 dose Tylenol 1000 mg by mouth 1 dose Dilaudid 0.5 mg x 1 dose Oxycodone 10 mg every 3 hours as needed Review UDS, CBC with differential, CMP, reticulocytes, urinalysis, and EKG as results become available. Pain intensity will be reevaluated in context of functioning and relationship to baseline as her care progresses. If pain intensity and other symptoms fail to dissipate, transition to inpatient services for higher level of care.  [name]  APRN, MSN, FNP-C [facility] [address] [place], [place] [address] [phone]  02/28/2019, 8:29 AM

## 2019-02-28 NOTE — Discharge Instructions (Signed)
Major Depressive Disorder, Adult Major depressive disorder (MDD) is a mental health condition. MDD often makes you feel sad, hopeless, or helpless. MDD can also cause symptoms in your body. MDD can affect your:  Work.  School.  Relationships.  Other normal activities. MDD can range from mild to very bad. It may occur once (single episode MDD). It can also occur many times (recurrent MDD). The main symptoms of MDD often include:  Feeling sad, depressed, or irritable most of the time.  Loss of interest. MDD symptoms also include:  Sleeping too much or too little.  Eating too much or too little.  A change in your weight.  Feeling tired (fatigue) or having low energy.  Feeling worthless.  Feeling guilty.  Trouble making decisions.  Trouble thinking clearly.  Thoughts of suicide or harming others.  Feeling weak.  Feeling agitated.  Keeping yourself from being around other people (isolation). Follow these instructions at home: Activity  Do these things as told by your doctor: ? Go back to your normal activities. ? Exercise regularly. ? Spend time outdoors. Alcohol  Talk with your doctor about how alcohol can affect your antidepressant medicines.  Do not drink alcohol. Or, limit how much alcohol you drink. ? This means no more than 1 drink a day for nonpregnant women and 2 drinks a day for men. One drink equals one of these:  12 oz of beer.  5 oz of wine.  1 oz of hard liquor. General instructions  Take over-the-counter and prescription medicines only as told by your doctor.  Eat a healthy diet.  Get plenty of sleep.  Find activities that you enjoy. Make time to do them.  Think about joining a support group. Your doctor may be able to suggest a group for you.  Keep all follow-up visits as told by your doctor. This is important. Where to find more information:  [employer] on Mental Illness: ? www.nami.[place]: ? https://carter.com/  National Suicide Prevention Lifeline: ? 212-038-8849. This is free, 24-hour help. Contact a doctor if:  Your symptoms get worse.  You have new symptoms. Get help right away if:  You self-harm.  You see, hear, taste, smell, or feel things that are not present (hallucinate). If you ever feel like you may hurt yourself or others, or have thoughts about taking your own life, get help right away. You can go to your nearest emergency department or call:  Your local emergency services (911 in the [place]  A suicide crisis helpline, such as the National Suicide Prevention Lifeline: ? 815-769-9830. This is open 24 hours a day. This information is not intended to replace advice given to you by your health care provider. Make sure you discuss any questions you have with your health care provider. Document Released: 04/06/2015 Document Revised: 04/07/2017 Document Reviewed: 01/10/2016 Elsevier Patient Education  [employer].   Sickle Cell Anemia, Adult  Sickle cell anemia is a condition where your red blood cells are shaped like sickles. Red blood cells carry oxygen through the body. Sickle-shaped cells do not live as long as normal red blood cells. They also clump together and block blood from flowing through the blood vessels. This prevents the body from getting enough oxygen. Sickle cell anemia causes organ damage and pain. It also increases the risk of infection. Follow these instructions at home: Medicines  Take over-the-counter and prescription medicines only as told by your doctor.  If you were prescribed an  antibiotic medicine, take it as told by your doctor. Do not stop taking the antibiotic even if you start to feel better.  If you develop a fever, do not take medicines to lower the fever right away. Tell your doctor about the fever. Managing pain, stiffness, and swelling  Try these methods to help with pain: ? Use a heating pad. ? Take a  warm bath. ? Distract yourself, such as by watching TV. Eating and drinking  Drink enough fluid to keep your pee (urine) clear or pale yellow. Drink more in hot weather and during exercise.  Limit or avoid alcohol.  Eat a healthy diet. Eat plenty of fruits, vegetables, whole grains, and lean protein.  Take vitamins and supplements as told by your doctor. Traveling  When traveling, keep these with you: ? Your medical information. ? The names of your doctors. ? Your medicines.  If you need to take an airplane, talk to your doctor first. Activity  Rest often.  Avoid exercises that make your heart beat much faster, such as jogging. General instructions  Do not use products that have nicotine or tobacco, such as cigarettes and e-cigarettes. If you need help quitting, ask your doctor.  Consider wearing a medical alert bracelet.  Avoid being in high places (high altitudes), such as mountains.  Avoid very hot or cold temperatures.  Avoid places where the temperature changes a lot.  Keep all follow-up visits as told by your doctor. This is important. Contact a doctor if:  A joint hurts.  Your feet or hands hurt or swell.  You feel tired (fatigued). Get help right away if:  You have symptoms of infection. These include: ? Fever. ? Chills. ? Being very tired. ? Irritability. ? Poor eating. ? Throwing up (vomiting).  You feel dizzy or faint.  You have new stomach pain, especially on the left side.  You have a an erection (priapism) that lasts more than 4 hours.  You have numbness in your arms or legs.  You have a hard time moving your arms or legs.  You have trouble talking.  You have pain that does not go away when you take medicine.  You are short of breath.  You are breathing fast.  You have a long-term cough.  You have pain in your chest.  You have a bad headache.  You have a stiff neck.  Your stomach looks bloated even though you did not eat  much.  Your skin is pale.  You suddenly cannot see well. Summary  Sickle cell anemia is a condition where your red blood cells are shaped like sickles.  Follow your doctor's advice on ways to manage pain, food to eat, activities to do, and steps to take for safe travel.  Get medical help right away if you have any signs of infection, such as a fever. This information is not intended to replace advice given to you by your health care provider. Make sure you discuss any questions you have with your health care provider. Document Released: 02/13/2013 Document Revised: 08/17/2018 Document Reviewed: 05/31/2016 Elsevier Patient Education  2020 [employer].

## 2019-02-28 NOTE — Progress Notes (Signed)
Patient admitted to the day hospital for sickle cell pain. Initially, patient reported right leg pain rated 5/10. For pain management, Patient received 0.5 mg Dilaudid, 1000 mg Tylenol, 15 mg Toradol, 10 mg Oxycodone and patient hydrated with IV fluids. At discharge, patient rated pain at 1/10. Vital signs stable. Discharge instructions given. Patient alert, oriented and ambulatory at discharge.

## 2019-02-28 NOTE — Progress Notes (Signed)
Patient came to the waiting room of day hospital for triage. Patient reports right leg pain rated 3/10. Also reports feeling "weak and jittery". Reports last taking pain medication on Sunday. COVID-19 screening done and negative. Patient denies fever, chest pain, nausea, vomiting, diarrhea and abdominal pain. [place], [place] notified.

## 2019-03-01 DIAGNOSIS — F320 Major depressive disorder, single episode, mild: Secondary | ICD-10-CM

## 2019-03-03 ENCOUNTER — Other Ambulatory Visit: Payer: Medicaid Other

## 2019-03-04 ENCOUNTER — Telehealth: Payer: Self-pay | Admitting: Family Medicine

## 2019-03-04 ENCOUNTER — Other Ambulatory Visit: Payer: Self-pay | Admitting: Family Medicine

## 2019-03-04 DIAGNOSIS — G894 Chronic pain syndrome: Secondary | ICD-10-CM

## 2019-03-04 DIAGNOSIS — D571 Sickle-cell disease without crisis: Secondary | ICD-10-CM

## 2019-03-04 NOTE — Telephone Encounter (Signed)
Lindsey [name],  Pt's last rx for oxycodone from [place] was written on 02/13/2019.

## 2019-03-04 NOTE — Telephone Encounter (Signed)
My apologies, I called and spoke with [facility] (Nochia, tech) She states this was picked up on 03/01/2019 for a 7 days supply.

## 2019-03-11 ENCOUNTER — Telehealth: Payer: Self-pay | Admitting: Family Medicine

## 2019-03-11 ENCOUNTER — Other Ambulatory Visit: Payer: Self-pay | Admitting: Family Medicine

## 2019-03-11 DIAGNOSIS — G894 Chronic pain syndrome: Secondary | ICD-10-CM

## 2019-03-11 MED ORDER — OXYCODONE HCL 5 MG PO TABS: 5.0000 mg | ORAL_TABLET | Freq: Four times a day (QID) | ORAL | 0 refills | Status: DC | PRN

## 2019-03-11 NOTE — Progress Notes (Signed)
Patient is scheduled for follow up on 03/26/2019.

## 2019-03-11 NOTE — Progress Notes (Signed)
Meds ordered this encounter  Medications  . oxyCODONE (ROXICODONE) 5 MG immediate release tablet    Sig: Take 1 tablet (5 mg total) by mouth every 6 (six) hours as needed for severe pain.    Dispense:  20 tablet    Refill:  0    Order Specific Question:   Supervising Provider    Answer:   Tresa Garter [[id number]]     [name]  APRN, MSN, FNP-C [facility] [address] [place], [place] [address] [phone]

## 2019-03-11 NOTE — Telephone Encounter (Signed)
Refill request for oxycodone. Please advise.  

## 2019-03-17 ENCOUNTER — Other Ambulatory Visit: Payer: Self-pay | Admitting: Family Medicine

## 2019-03-17 DIAGNOSIS — G4700 Insomnia, unspecified: Secondary | ICD-10-CM

## 2019-03-19 ENCOUNTER — Telehealth: Payer: Self-pay | Admitting: Family Medicine

## 2019-03-19 ENCOUNTER — Other Ambulatory Visit: Payer: Self-pay | Admitting: Family Medicine

## 2019-03-20 ENCOUNTER — Other Ambulatory Visit: Payer: Self-pay | Admitting: Family Medicine

## 2019-03-20 DIAGNOSIS — G894 Chronic pain syndrome: Secondary | ICD-10-CM

## 2019-03-20 MED ORDER — OXYCODONE HCL 5 MG PO TABS: 5.0000 mg | ORAL_TABLET | Freq: Four times a day (QID) | ORAL | 0 refills | Status: DC | PRN

## 2019-03-20 NOTE — Telephone Encounter (Signed)
done

## 2019-03-21 ENCOUNTER — Ambulatory Visit: Payer: Medicaid Other | Admitting: Family Medicine

## 2019-03-26 ENCOUNTER — Encounter: Payer: Self-pay | Admitting: Family Medicine

## 2019-03-26 ENCOUNTER — Other Ambulatory Visit: Payer: Self-pay

## 2019-03-26 ENCOUNTER — Ambulatory Visit (INDEPENDENT_AMBULATORY_CARE_PROVIDER_SITE_OTHER): Payer: Medicaid Other | Admitting: Family Medicine

## 2019-03-26 VITALS — BP 121/70 | HR 76 | Temp 99.4°F | Resp 14 | Ht 60.0 in | Wt 156.0 lb

## 2019-03-26 DIAGNOSIS — D571 Sickle-cell disease without crisis: Secondary | ICD-10-CM | POA: Diagnosis not present

## 2019-03-26 DIAGNOSIS — F17200 Nicotine dependence, unspecified, uncomplicated: Secondary | ICD-10-CM | POA: Diagnosis not present

## 2019-03-26 DIAGNOSIS — G894 Chronic pain syndrome: Secondary | ICD-10-CM

## 2019-03-26 DIAGNOSIS — R8290 Unspecified abnormal findings in urine: Secondary | ICD-10-CM

## 2019-03-26 LAB — POCT URINALYSIS DIPSTICK
Bilirubin, UA: NEGATIVE
Blood, UA: NEGATIVE
Glucose, UA: NEGATIVE
Ketones, UA: NEGATIVE
Nitrite, UA: NEGATIVE
Protein, UA: NEGATIVE
Spec Grav, UA: 1.015 (ref 1.010–1.025)
Urobilinogen, UA: 0.2 E.U./dL
pH, UA: 5.5 (ref 5.0–8.0)

## 2019-03-26 MED ORDER — OXYCODONE HCL 10 MG PO TABS: 10.0000 mg | ORAL_TABLET | ORAL | 0 refills | Status: DC | PRN

## 2019-03-26 NOTE — Patient Instructions (Signed)

## 2019-03-28 LAB — URINE CULTURE

## 2019-03-29 LAB — OXYCODONE/OXYMORPHONE, CONFIRM
OXYCODONE/OXYMORPH: POSITIVE — AB
OXYCODONE: NEGATIVE
OXYMORPHONE (GC/MS): 407 ng/mL
OXYMORPHONE: POSITIVE — AB

## 2019-03-29 LAB — DRUG SCREEN 764883 11+OXYCO+ALC+CRT-BUND
Amphetamines, Urine: NEGATIVE ng/mL
BENZODIAZ UR QL: NEGATIVE ng/mL
Barbiturate: NEGATIVE ng/mL
Cannabinoid Quant, Ur: NEGATIVE ng/mL
Cocaine (Metabolite): NEGATIVE ng/mL
Creatinine: 162.3 mg/dL (ref 20.0–300.0)
Ethanol: NEGATIVE %
Meperidine: NEGATIVE ng/mL
Methadone Screen, Urine: NEGATIVE ng/mL
OPIATE SCREEN URINE: NEGATIVE ng/mL
Phencyclidine: NEGATIVE ng/mL
Propoxyphene: NEGATIVE ng/mL
Tramadol: NEGATIVE ng/mL
pH, Urine: 5.4 (ref 4.5–8.9)

## 2019-03-31 NOTE — Progress Notes (Signed)
[name]  . Established Patient Office Visit  Subjective:  Patient ID: Lindsey [name], female    DOB: [birth year]  Age: 48 y.o. MRN: [id number]  CC:  Chief Complaint  Patient presents with  . Sickle Cell Anemia    HPI Lindsey [name], a 48 year old female with a medical history significant for sickle cell disease, chronic pain syndrome, opiate dependence, opiate tolerance, history of opiate addiction, history of peripheral neuropathy, tobacco dependence, and history of anemia of chronic disease presents to discuss medication management.  Patient has been on oxycodone 5 mg every 4-6 hours over the past several months.  Patient states the medication has been ineffective in treating chronic low back and lower extremity pain. She admits to taking more medication than prescribed to achieve greater pain control. Patient has also been taking gabapentin consistently for peripheral neuropathy.  Current pain intensity is 7-8/10 primarily to right lower extremity.  Pain is worsened with weightbearing and exercising.  She denies headache, blurred vision, chest pain, dysuria, nausea, vomiting, or diarrhea.  Past Medical History:  Diagnosis Date  . Opiate abuse, episodic ([place]) 09/25/2017  . Opioid dependence in remission ([place])   . Sickle cell crisis [facility])     Past Surgical History:  Procedure Laterality Date  . APPENDECTOMY    . CESAREAN SECTION    . OTHER SURGICAL HISTORY     c-section    Family History  Problem Relation Age of Onset  . Stroke Neg Hx        none that she knows of   . Seizures Neg Hx     Social History   Socioeconomic History  . Marital status: Married    Spouse name: Not on file  . Number of children: 2  . Years of education: Not on file  . Highest education level: Not on file  Occupational History  . Not on file  Social Needs  . Financial resource strain: Not on file  . Food insecurity    Worry: Not on file   Inability: Not on file  . Transportation needs    Medical: Not on file    Non-medical: Not on file  Tobacco Use  . Smoking status: Current Some Day Smoker    Packs/day: 0.00    Types: Cigars  . Smokeless tobacco: Never Used  Substance and Sexual Activity  . Alcohol use: Yes    Comment: occasionally  . Drug use: No  . Sexual activity: Yes    Birth control/protection: None  Lifestyle  . Physical activity    Days per week: Not on file    Minutes per session: Not on file  . Stress: Not on file  Relationships  . Social Herbalist on phone: Not on file    Gets together: Not on file    Attends religious service: Not on file    Active member of club or organization: Not on file    Attends meetings of clubs or organizations: Not on file    Relationship status: Not on file  . Intimate partner violence    Fear of current or ex partner: Not on file    Emotionally abused: Not on file    Physically abused: Not on file    Forced sexual activity: Not on file  Other Topics Concern  . Not on file  Social History Narrative   Lives at home with husband and son   Right handed    Outpatient  Medications Prior to Visit  Medication Sig Dispense Refill  . DULoxetine (CYMBALTA) 20 MG capsule Take 1 capsule (20 mg total) by mouth daily. 30 capsule 2  . gabapentin (NEURONTIN) 300 MG capsule TAKE 1 CAPSULE(300 MG) BY MOUTH THREE TIMES DAILY 90 capsule 1  . hydrOXYzine (ATARAX/VISTARIL) 25 MG tablet TAKE 1 TO 2 TABLETS(25 TO 50 MG) BY MOUTH AT BEDTIME 60 tablet 2  . ibuprofen (ADVIL) 800 MG tablet TAKE 1 TABLET(800 MG) BY MOUTH EVERY 8 HOURS AS NEEDED 60 tablet 5  . oxyCODONE (ROXICODONE) 5 MG immediate release tablet Take 1 tablet (5 mg total) by mouth every 6 (six) hours as needed for severe pain. 30 tablet 0  . albuterol (VENTOLIN HFA) 108 (90 Base) MCG/ACT inhaler Inhale 2 puffs into the lungs every 6 (six) hours as needed for wheezing or shortness of breath. (Patient not taking:  Reported on 01/29/2019) 6.7 g 0  . ergocalciferol (DRISDOL) 50000 units capsule Take 1 capsule (50,000 Units total) by mouth once a week. (Patient not taking: Reported on 03/26/2019) 30 capsule 1   No facility-administered medications prior to visit.     No Known Allergies  ROS Review of Systems  Constitutional: Negative.   HENT: Negative.   Eyes: Negative.   Respiratory: Negative.   Cardiovascular: Negative for chest pain and leg swelling.  Endocrine: Negative.   Genitourinary: Negative.   Musculoskeletal: Positive for arthralgias (Right lower extremity) and back pain.  Skin: Negative.   Neurological: Negative.   Hematological: Negative.   Psychiatric/Behavioral: Negative.       Objective:    Physical Exam  Constitutional: She is oriented to person, place, and time.  HENT:  Head: Normocephalic and atraumatic.  Eyes: Pupils are equal, round, and reactive to light.  Neck: Normal range of motion.  Cardiovascular: Normal rate and regular rhythm.  Pulmonary/Chest: Effort normal and breath sounds normal.  Abdominal: Soft. Bowel sounds are normal.  Musculoskeletal: Normal range of motion.  Neurological: She is alert and oriented to person, place, and time.  Skin: Skin is warm and dry.  Psychiatric: She has a normal mood and affect. Her behavior is normal. Judgment and thought content normal.    BP 121/70 (BP Location: Right Arm, Patient Position: Sitting, Cuff Size: Normal)   Pulse 76   Temp 99.4 F (37.4 C) (Oral)   Resp 14   Ht 5' (1.524 m)   Wt 156 lb (70.8 kg)   LMP 03/08/2019   SpO2 97%   BMI 30.47 kg/m  Wt Readings from Last 3 Encounters:  03/26/19 156 lb (70.8 kg)  02/20/19 155 lb (70.3 kg)  01/29/19 155 lb 3.2 oz (70.4 kg)     There are no preventive care reminders to display for this patient.  There are no preventive care reminders to display for this patient.  Lab Results  Component Value Date   TSH 2.480 02/20/2019   Lab Results  Component  Value Date   WBC 9.0 02/28/2019   HGB 10.8 (L) 02/28/2019   HCT 29.0 (L) 02/28/2019   MCV 84.5 02/28/2019   PLT 454 (H) 02/28/2019   Lab Results  Component Value Date   NA 139 02/28/2019   K 4.2 02/28/2019   CO2 22 02/28/2019   GLUCOSE 100 (H) 02/28/2019   BUN 8 02/28/2019   CREATININE 0.89 02/28/2019   BILITOT 1.3 (H) 02/28/2019   ALKPHOS 37 (L) 02/28/2019   AST 16 02/28/2019   ALT 13 02/28/2019   PROT 7.3 02/28/2019  ALBUMIN 4.2 02/28/2019   CALCIUM 9.0 02/28/2019   ANIONGAP 7 02/28/2019   Lab Results  Component Value Date   CHOL  07/25/2007    119        ATP III CLASSIFICATION:  <200     mg/dL   Desirable  505-397  mg/dL   Borderline High  >=673    mg/dL   High   Lab Results  Component Value Date   HDL 33 (L) 07/25/2007   Lab Results  Component Value Date   LDLCALC  07/25/2007    78        Total Cholesterol/HDL:CHD Risk Coronary Heart Disease Risk Table                     Men   Women  1/2 Average Risk   3.4   3.3   Lab Results  Component Value Date   TRIG 38 07/25/2007   Lab Results  Component Value Date   CHOLHDL 3.6 07/25/2007   No results found for: HGBA1C    Assessment & Plan:   Problem List Items Addressed This Visit      Other   Sickle cell disease (HCC) - Primary   Relevant Orders   Urinalysis Dipstick (Completed)   Chronic pain   Relevant Medications   Oxycodone HCl 10 MG TABS   Other Relevant Orders   [id number] 11+Oxyco+Alc+Crt-Bund (Completed)    Other Visit Diagnoses    Abnormal urinalysis       Relevant Orders   Urine Culture (Completed)   Tobacco dependence          Meds ordered this encounter  Medications  . Oxycodone HCl 10 MG TABS    Sig: Take 1 tablet (10 mg total) by mouth every 4 (four) hours as needed for severe pain.    Dispense:  60 tablet    Refill:  0    Order Specific Question:   Supervising Provider    Answer:   Quentin Angst [id number]   Hb-SS disease without crisis (HCC) Reviewed most recent  labs, do not warrant repeating at this time.  - Urinalysis Dipstick  Chronic pain syndrome Oxycodone increased to 10 mg every 4 hours as needed for moderate to severe chronic pain. Patient and I discussed adjunct medications at length.   - Oxycodone HCl 10 MG TABS; Take 1 tablet (10 mg total) by mouth every 4 (four) hours as needed for severe pain.  Dispense: 60 tablet; Refill: 0 - [id number] 11+Oxyco+Alc+Crt-Bund  Abnormal urinalysis Urine leukocytes. Will follow urine culture.  - Urine Culture  Tobacco dependence Smoking cessation instruction/counseling given:  counseled patient on the dangers of tobacco use, advised patient to stop smoking, and reviewed strategies to maximize success    Follow-up: Return in about 3 months (around 06/26/2019) for sickle cell anemia, chronic pain.    [name]  APRN, MSN, FNP-C Patient Care [facility] [address] [place], [place] [address] [phone]

## 2019-04-11 ENCOUNTER — Other Ambulatory Visit: Payer: Self-pay

## 2019-04-11 DIAGNOSIS — D5700 Hb-SS disease with crisis, unspecified: Secondary | ICD-10-CM

## 2019-04-11 MED ORDER — GABAPENTIN 300 MG PO CAPS: ORAL_CAPSULE | ORAL | 1 refills | Status: DC

## 2019-04-16 ENCOUNTER — Telehealth: Payer: Self-pay | Admitting: Family Medicine

## 2019-04-16 ENCOUNTER — Other Ambulatory Visit: Payer: Self-pay | Admitting: Family Medicine

## 2019-04-16 DIAGNOSIS — G894 Chronic pain syndrome: Secondary | ICD-10-CM

## 2019-04-16 MED ORDER — OXYCODONE HCL 10 MG PO TABS: 10.0000 mg | ORAL_TABLET | ORAL | 0 refills | Status: DC | PRN

## 2019-04-16 NOTE — Telephone Encounter (Signed)
done

## 2019-04-16 NOTE — Progress Notes (Signed)
Reviewed PDMP prior to prescribing opiate medications, no inconsistencies noted.   Meds ordered this encounter  Medications  . Oxycodone HCl 10 MG TABS    Sig: Take 1 tablet (10 mg total) by mouth every 4 (four) hours as needed.    Dispense:  60 tablet    Refill:  0    Order Specific Question:   Supervising Provider    Answer:   [name], [name]    Deven Audi Moore Takeisha Cianci  APRN, MSN, FNP-C Patient [facility] [address]  [place], [place] [address] [phone]  

## 2019-04-30 ENCOUNTER — Other Ambulatory Visit: Payer: Self-pay | Admitting: Family Medicine

## 2019-04-30 ENCOUNTER — Ambulatory Visit: Payer: Medicaid Other | Admitting: Family Medicine

## 2019-04-30 ENCOUNTER — Telehealth: Payer: Self-pay | Admitting: Family Medicine

## 2019-04-30 DIAGNOSIS — G894 Chronic pain syndrome: Secondary | ICD-10-CM

## 2019-04-30 MED ORDER — OXYCODONE HCL 10 MG PO TABS: 10.0000 mg | ORAL_TABLET | ORAL | 0 refills | Status: DC | PRN

## 2019-04-30 NOTE — Progress Notes (Signed)
Reviewed PDMP substance reporting system prior to prescribing opiate medications. No inconsistencies noted.  Meds ordered this encounter  Medications   Oxycodone HCl 10 MG TABS    Sig: Take 1 tablet (10 mg total) by mouth every 4 (four) hours as needed.    Dispense:  60 tablet    Refill:  0    Order Specific Question:   Supervising Provider    Answer:   [name], [name]   Beckey Polkowski Moore Bevelyn Arriola  APRN, MSN, FNP-C [facility] [address]  [place], [place] [address] [phone]  

## 2019-05-01 NOTE — Telephone Encounter (Signed)
done

## 2019-05-13 ENCOUNTER — Other Ambulatory Visit: Payer: Self-pay | Admitting: Family Medicine

## 2019-05-13 ENCOUNTER — Telehealth: Payer: Self-pay | Admitting: Family Medicine

## 2019-05-13 DIAGNOSIS — G894 Chronic pain syndrome: Secondary | ICD-10-CM

## 2019-05-13 MED ORDER — OXYCODONE HCL 10 MG PO TABS: 10.0000 mg | ORAL_TABLET | Freq: Four times a day (QID) | ORAL | 0 refills | Status: DC | PRN

## 2019-05-13 NOTE — Progress Notes (Signed)
Reviewed PDMP substance reporting system prior to prescribing opiate medications. No inconsistencies noted.  Meds ordered this encounter  Medications   Oxycodone HCl 10 MG TABS    Sig: Take 1 tablet (10 mg total) by mouth every 6 (six) hours as needed.    Dispense:  60 tablet    Refill:  0    Order Specific Question:   Supervising Provider    Answer:   [name], [name]   Lindsey Moore Hollis  APRN, MSN, FNP-C [facility] [address]  [place], [place] [address] [phone]  

## 2019-05-14 NOTE — Telephone Encounter (Signed)
Done

## 2019-05-30 ENCOUNTER — Other Ambulatory Visit: Payer: Self-pay

## 2019-05-30 ENCOUNTER — Other Ambulatory Visit: Payer: Self-pay | Admitting: Family Medicine

## 2019-05-30 ENCOUNTER — Telehealth: Payer: Self-pay | Admitting: Family Medicine

## 2019-05-30 DIAGNOSIS — D5700 Hb-SS disease with crisis, unspecified: Secondary | ICD-10-CM

## 2019-05-30 DIAGNOSIS — G894 Chronic pain syndrome: Secondary | ICD-10-CM

## 2019-05-30 MED ORDER — GABAPENTIN 300 MG PO CAPS: ORAL_CAPSULE | ORAL | 1 refills | Status: DC

## 2019-05-30 MED ORDER — OXYCODONE HCL 10 MG PO TABS: 10.0000 mg | ORAL_TABLET | Freq: Four times a day (QID) | ORAL | 0 refills | Status: DC | PRN

## 2019-05-30 NOTE — Progress Notes (Signed)
Reviewed PDMP substance reporting system prior to prescribing opiate medications. No inconsistencies noted.    Meds ordered this encounter  Medications  . gabapentin (NEURONTIN) 300 MG capsule    Sig: Take 1 capsule by mouth three times daily    Dispense:  90 capsule    Refill:  1    Order Specific Question:   Supervising Provider    Answer:   Quentin Angst [id number]  . Oxycodone HCl 10 MG TABS    Sig: Take 1 tablet (10 mg total) by mouth every 6 (six) hours as needed.    Dispense:  60 tablet    Refill:  0    Order Specific Question:   Supervising Provider    Answer:   Quentin Angst [[id number]]    [name]  APRN, MSN, FNP-C Patient Care [facility] [address] [place], [place] [address] [phone]

## 2019-05-31 NOTE — Telephone Encounter (Signed)
done

## 2019-06-14 ENCOUNTER — Telehealth: Payer: Self-pay | Admitting: Family Medicine

## 2019-06-14 ENCOUNTER — Other Ambulatory Visit: Payer: Self-pay | Admitting: Family Medicine

## 2019-06-14 DIAGNOSIS — G894 Chronic pain syndrome: Secondary | ICD-10-CM

## 2019-06-14 MED ORDER — OXYCODONE HCL 10 MG PO TABS: 10.0000 mg | ORAL_TABLET | Freq: Four times a day (QID) | ORAL | 0 refills | Status: DC | PRN

## 2019-06-14 NOTE — Telephone Encounter (Signed)
Med refill oxycodone 

## 2019-06-14 NOTE — Progress Notes (Signed)
Reviewed PDMP substance reporting system prior to prescribing opiate medications. No inconsistencies noted.  Meds ordered this encounter  Medications   Oxycodone HCl 10 MG TABS    Sig: Take 1 tablet (10 mg total) by mouth every 6 (six) hours as needed.    Dispense:  60 tablet    Refill:  0    Order Specific Question:   Supervising Provider    Answer:   [name], [name]   Lindsey [name] Moore Olivia Pavelko  APRN, MSN, FNP-C [facility] [address]  [place], [place] [address] [phone]  

## 2019-06-17 NOTE — Telephone Encounter (Signed)
done

## 2019-06-25 ENCOUNTER — Telehealth (HOSPITAL_COMMUNITY): Payer: Self-pay | Admitting: *Deleted

## 2019-06-25 NOTE — Telephone Encounter (Signed)
Patient called requesting to come to the day hospital. The day hospital is currently at capacity and unable to accept any more patients. Patient informed to continue to take medications around the clock as prescribed and to call back to the day clinic early tomorrow morning. Patient advised to go to the ER for severe symptoms.  Patient expresses an understanding.

## 2019-06-28 ENCOUNTER — Other Ambulatory Visit: Payer: Self-pay | Admitting: Family Medicine

## 2019-06-28 ENCOUNTER — Telehealth: Payer: Self-pay | Admitting: Family Medicine

## 2019-06-28 DIAGNOSIS — G894 Chronic pain syndrome: Secondary | ICD-10-CM

## 2019-06-28 MED ORDER — OXYCODONE HCL 10 MG PO TABS: 10.0000 mg | ORAL_TABLET | Freq: Four times a day (QID) | ORAL | 0 refills | Status: DC | PRN

## 2019-06-28 NOTE — Progress Notes (Signed)
Reviewed PDMP substance reporting system prior to prescribing opiate medications. No inconsistencies noted.  Meds ordered this encounter  Medications   Oxycodone HCl 10 MG TABS    Sig: Take 1 tablet (10 mg total) by mouth every 6 (six) hours as needed.    Dispense:  60 tablet    Refill:  0    Order Specific Question:   Supervising Provider    Answer:   [name], [name]   Lindsey Gates Moore Aries Townley  APRN, MSN, FNP-C [facility] [address]  [place], [place] [address] [phone]  

## 2019-07-01 NOTE — Telephone Encounter (Signed)
done

## 2019-07-10 ENCOUNTER — Other Ambulatory Visit: Payer: Self-pay

## 2019-07-10 ENCOUNTER — Emergency Department (HOSPITAL_COMMUNITY)
Admission: EM | Admit: 2019-07-10 | Discharge: 2019-07-10 | Disposition: A | Discharge status: Home / Self Care | Payer: Medicaid Other | Attending: Emergency Medicine | Admitting: Emergency Medicine

## 2019-07-10 ENCOUNTER — Encounter (HOSPITAL_COMMUNITY): Payer: Self-pay

## 2019-07-10 DIAGNOSIS — Z5321 Procedure and treatment not carried out due to patient leaving prior to being seen by health care provider: Secondary | ICD-10-CM | POA: Insufficient documentation

## 2019-07-10 DIAGNOSIS — M79604 Pain in right leg: Secondary | ICD-10-CM | POA: Diagnosis present

## 2019-07-10 DIAGNOSIS — D5700 Hb-SS disease with crisis, unspecified: Secondary | ICD-10-CM | POA: Insufficient documentation

## 2019-07-10 LAB — RETICULOCYTES
Immature Retic Fract: 15.3 % (ref 2.3–15.9)
RBC.: 3.35 MIL/uL — ABNORMAL LOW (ref 3.87–5.11)
Retic Count, Absolute: 110.6 10*3/uL (ref 19.0–186.0)
Retic Ct Pct: 3.3 % — ABNORMAL HIGH (ref 0.4–3.1)

## 2019-07-10 LAB — COMPREHENSIVE METABOLIC PANEL
ALT: 14 U/L (ref 0–44)
AST: 17 U/L (ref 15–41)
Albumin: 4 g/dL (ref 3.5–5.0)
Alkaline Phosphatase: 34 U/L — ABNORMAL LOW (ref 38–126)
Anion gap: 9 (ref 5–15)
BUN: 7 mg/dL (ref 6–20)
CO2: 25 mmol/L (ref 22–32)
Calcium: 9.2 mg/dL (ref 8.9–10.3)
Chloride: 108 mmol/L (ref 98–111)
Creatinine, Ser: 1.05 mg/dL — ABNORMAL HIGH (ref 0.44–1.00)
GFR calc Af Amer: >60 mL/min (ref >60)
GFR calc non Af Amer: >60 mL/min (ref >60)
Glucose, Bld: 98 mg/dL (ref 70–99)
Potassium: 3.8 mmol/L (ref 3.5–5.1)
Sodium: 142 mmol/L (ref 135–145)
Total Bilirubin: 1.4 mg/dL — ABNORMAL HIGH (ref 0.3–1.2)
Total Protein: 6.9 g/dL (ref 6.5–8.1)

## 2019-07-10 LAB — CBC WITH DIFFERENTIAL/PLATELET
Abs Immature Granulocytes: 0.04 10*3/uL (ref 0.00–0.07)
Basophils Absolute: 0.1 10*3/uL (ref 0.0–0.1)
Basophils Relative: 1 %
Eosinophils Absolute: 0.3 10*3/uL (ref 0.0–0.5)
Eosinophils Relative: 3 %
HCT: 28.4 % — ABNORMAL LOW (ref 36.0–46.0)
Hemoglobin: 10.8 g/dL — ABNORMAL LOW (ref 12.0–15.0)
Immature Granulocytes: 0 %
Lymphocytes Relative: 37 %
Lymphs Abs: 3.8 10*3/uL (ref 0.7–4.0)
MCH: 32.6 pg (ref 26.0–34.0)
MCHC: 38 g/dL — ABNORMAL HIGH (ref 30.0–36.0)
MCV: 85.8 fL (ref 80.0–100.0)
Monocytes Absolute: 0.7 10*3/uL (ref 0.1–1.0)
Monocytes Relative: 7 %
Neutro Abs: 5.4 10*3/uL (ref 1.7–7.7)
Neutrophils Relative %: 52 %
Platelets: 488 10*3/uL — ABNORMAL HIGH (ref 150–400)
RBC: 3.31 MIL/uL — ABNORMAL LOW (ref 3.87–5.11)
RDW: 13.2 % (ref 11.5–15.5)
WBC: 10.3 10*3/uL (ref 4.0–10.5)
nRBC: 0.2 % (ref 0.0–0.2)

## 2019-07-10 LAB — I-STAT BETA HCG BLOOD, ED (MC, WL, AP ONLY): I-stat hCG, quantitative: <5 m[IU]/mL (ref <5)

## 2019-07-10 NOTE — ED Notes (Signed)
No answer in lobby for vitals recheck 

## 2019-07-10 NOTE — ED Triage Notes (Signed)
Pt reports sickle cell pain in her right leg since 1pm today. Pt taking oxycodone at home without relief. Pt tearful in triage.

## 2019-07-11 ENCOUNTER — Telehealth: Payer: Self-pay | Admitting: Family Medicine

## 2019-07-11 ENCOUNTER — Other Ambulatory Visit: Payer: Self-pay | Admitting: Family Medicine

## 2019-07-11 DIAGNOSIS — G894 Chronic pain syndrome: Secondary | ICD-10-CM

## 2019-07-11 MED ORDER — OXYCODONE HCL 10 MG PO TABS: 10.0000 mg | ORAL_TABLET | Freq: Four times a day (QID) | ORAL | 0 refills | Status: DC | PRN

## 2019-07-11 NOTE — Telephone Encounter (Signed)
Pt requested a refill on oxycodone 10 mg. Only has 4 left after a sickle cell crisis.

## 2019-07-11 NOTE — Progress Notes (Signed)
Reviewed PDMP substance reporting system prior to prescribing opiate medications. No inconsistencies noted.   Patient requesting medications early. She can pick up 1 day early on 07/13/2019. Patient can contact sickle cell day infusion center M-F 8 am-4:30 for day admission or report to ER weekends or after hours  In the event of sickle cell pain crisis.   Meds ordered this encounter  Medications  . Oxycodone HCl 10 MG TABS    Sig: Take 1 tablet (10 mg total) by mouth every 6 (six) hours as needed.    Dispense:  60 tablet    Refill:  0    Order Specific Question:   Supervising Provider    Answer:   Quentin Angst [[id number]]     [name]  APRN, MSN, FNP-C Patient Care [facility] [address] [place], [place] [address] [phone]

## 2019-07-23 ENCOUNTER — Emergency Department (HOSPITAL_BASED_OUTPATIENT_CLINIC_OR_DEPARTMENT_OTHER): Payer: Medicaid Other

## 2019-07-23 ENCOUNTER — Other Ambulatory Visit: Payer: Self-pay

## 2019-07-23 ENCOUNTER — Telehealth: Payer: Self-pay | Admitting: Family Medicine

## 2019-07-23 ENCOUNTER — Inpatient Hospital Stay (HOSPITAL_COMMUNITY)
Admission: EM | Admit: 2019-07-23 | Discharge: 2019-07-25 | DRG: 812 | Disposition: A | Discharge status: Home / Self Care | Payer: Medicaid Other | Attending: Internal Medicine | Admitting: Internal Medicine

## 2019-07-23 ENCOUNTER — Encounter (HOSPITAL_COMMUNITY): Payer: Self-pay | Admitting: Emergency Medicine

## 2019-07-23 ENCOUNTER — Emergency Department (HOSPITAL_COMMUNITY): Payer: Medicaid Other

## 2019-07-23 DIAGNOSIS — D57219 Sickle-cell/Hb-C disease with crisis, unspecified: Principal | ICD-10-CM | POA: Diagnosis present

## 2019-07-23 DIAGNOSIS — G8929 Other chronic pain: Secondary | ICD-10-CM | POA: Diagnosis present

## 2019-07-23 DIAGNOSIS — Z20822 Contact with and (suspected) exposure to covid-19: Secondary | ICD-10-CM | POA: Diagnosis present

## 2019-07-23 DIAGNOSIS — G894 Chronic pain syndrome: Secondary | ICD-10-CM | POA: Diagnosis present

## 2019-07-23 DIAGNOSIS — D5700 Hb-SS disease with crisis, unspecified: Secondary | ICD-10-CM | POA: Diagnosis present

## 2019-07-23 DIAGNOSIS — F17290 Nicotine dependence, other tobacco product, uncomplicated: Secondary | ICD-10-CM | POA: Diagnosis present

## 2019-07-23 DIAGNOSIS — M79609 Pain in unspecified limb: Secondary | ICD-10-CM

## 2019-07-23 LAB — URINALYSIS, ROUTINE W REFLEX MICROSCOPIC
Bilirubin Urine: NEGATIVE
Glucose, UA: NEGATIVE mg/dL
Hgb urine dipstick: NEGATIVE
Ketones, ur: NEGATIVE mg/dL
Nitrite: NEGATIVE
Protein, ur: NEGATIVE mg/dL
Specific Gravity, Urine: 1.009 (ref 1.005–1.030)
pH: 5 (ref 5.0–8.0)

## 2019-07-23 LAB — CBC WITH DIFFERENTIAL/PLATELET
Abs Immature Granulocytes: 0.05 10*3/uL (ref 0.00–0.07)
Basophils Absolute: 0 10*3/uL (ref 0.0–0.1)
Basophils Relative: 0 %
Eosinophils Absolute: 0.2 10*3/uL (ref 0.0–0.5)
Eosinophils Relative: 2 %
HCT: 31.1 % — ABNORMAL LOW (ref 36.0–46.0)
Hemoglobin: 11.5 g/dL — ABNORMAL LOW (ref 12.0–15.0)
Immature Granulocytes: 0 %
Lymphocytes Relative: 26 %
Lymphs Abs: 3.1 10*3/uL (ref 0.7–4.0)
MCH: 32 pg (ref 26.0–34.0)
MCHC: 37 g/dL — ABNORMAL HIGH (ref 30.0–36.0)
MCV: 86.6 fL (ref 80.0–100.0)
Monocytes Absolute: 0.8 10*3/uL (ref 0.1–1.0)
Monocytes Relative: 6 %
Neutro Abs: 7.7 10*3/uL (ref 1.7–7.7)
Neutrophils Relative %: 66 %
Platelets: 425 10*3/uL — ABNORMAL HIGH (ref 150–400)
RBC: 3.59 MIL/uL — ABNORMAL LOW (ref 3.87–5.11)
RDW: 13.5 % (ref 11.5–15.5)
WBC: 11.8 10*3/uL — ABNORMAL HIGH (ref 4.0–10.5)
nRBC: 0.2 % (ref 0.0–0.2)

## 2019-07-23 LAB — COMPREHENSIVE METABOLIC PANEL
ALT: 15 U/L (ref 0–44)
AST: 19 U/L (ref 15–41)
Albumin: 4.2 g/dL (ref 3.5–5.0)
Alkaline Phosphatase: 34 U/L — ABNORMAL LOW (ref 38–126)
Anion gap: 6 (ref 5–15)
BUN: 8 mg/dL (ref 6–20)
CO2: 22 mmol/L (ref 22–32)
Calcium: 9.2 mg/dL (ref 8.9–10.3)
Chloride: 111 mmol/L (ref 98–111)
Creatinine, Ser: 0.82 mg/dL (ref 0.44–1.00)
GFR calc Af Amer: >60 mL/min (ref >60)
GFR calc non Af Amer: >60 mL/min (ref >60)
Glucose, Bld: 96 mg/dL (ref 70–99)
Potassium: 4.1 mmol/L (ref 3.5–5.1)
Sodium: 139 mmol/L (ref 135–145)
Total Bilirubin: 1.1 mg/dL (ref 0.3–1.2)
Total Protein: 7 g/dL (ref 6.5–8.1)

## 2019-07-23 LAB — RETICULOCYTES
Immature Retic Fract: 19.9 % — ABNORMAL HIGH (ref 2.3–15.9)
RBC.: 3.55 MIL/uL — ABNORMAL LOW (ref 3.87–5.11)
Retic Count, Absolute: 128.2 10*3/uL (ref 19.0–186.0)
Retic Ct Pct: 3.6 % — ABNORMAL HIGH (ref 0.4–3.1)

## 2019-07-23 LAB — I-STAT BETA HCG BLOOD, ED (MC, WL, AP ONLY): I-stat hCG, quantitative: <5 m[IU]/mL (ref <5)

## 2019-07-23 MED ORDER — SODIUM CHLORIDE 0.9% FLUSH: 3.0000 mL | Freq: Once | INTRAVENOUS | Status: DC

## 2019-07-23 MED ORDER — HYDROMORPHONE HCL 1 MG/ML IJ SOLN
2.0000 mg | Freq: Once | INTRAMUSCULAR | Status: AC
  Administered 2019-07-23: 2 mg via INTRAVENOUS
  Filled 2019-07-23: qty 2

## 2019-07-23 MED ORDER — SODIUM CHLORIDE 0.9% FLUSH: 9.0000 mL | INTRAVENOUS | Status: DC | PRN

## 2019-07-23 MED ORDER — SODIUM CHLORIDE 0.9 % IV SOLN
25.0000 mg | INTRAVENOUS | Status: DC | PRN
  Filled 2019-07-23: qty 0.5

## 2019-07-23 MED ORDER — KETOROLAC TROMETHAMINE 30 MG/ML IJ SOLN
30.0000 mg | Freq: Four times a day (QID) | INTRAMUSCULAR | Status: DC
  Administered 2019-07-23 – 2019-07-25 (×6): 30 mg via INTRAVENOUS
  Filled 2019-07-23 (×6): qty 1

## 2019-07-23 MED ORDER — POLYETHYLENE GLYCOL 3350 17 G PO PACK: 17.0000 g | PACK | Freq: Every day | ORAL | Status: DC | PRN

## 2019-07-23 MED ORDER — HYDROMORPHONE 1 MG/ML IV SOLN
INTRAVENOUS | Status: DC
  Administered 2019-07-24: 2 mg via INTRAVENOUS
  Administered 2019-07-24: 30 mg via INTRAVENOUS
  Administered 2019-07-24 (×2): 1.5 mg via INTRAVENOUS
  Filled 2019-07-23: qty 30

## 2019-07-23 MED ORDER — DIPHENHYDRAMINE HCL 25 MG PO CAPS: 25.0000 mg | ORAL_CAPSULE | ORAL | Status: DC | PRN

## 2019-07-23 MED ORDER — ENOXAPARIN SODIUM 40 MG/0.4ML ~~LOC~~ SOLN
40.0000 mg | SUBCUTANEOUS | Status: DC
  Administered 2019-07-23 – 2019-07-24 (×2): 40 mg via SUBCUTANEOUS
  Filled 2019-07-23 (×2): qty 0.4

## 2019-07-23 MED ORDER — HYDROMORPHONE HCL 1 MG/ML IJ SOLN
2.0000 mg | Freq: Once | INTRAMUSCULAR | Status: AC
  Administered 2019-07-23: 21:00:00 2 mg via INTRAVENOUS
  Filled 2019-07-23: qty 2

## 2019-07-23 MED ORDER — NALOXONE HCL 0.4 MG/ML IJ SOLN: 0.4000 mg | INTRAMUSCULAR | Status: DC | PRN

## 2019-07-23 MED ORDER — ONDANSETRON HCL 4 MG/2ML IJ SOLN: 4.0000 mg | Freq: Four times a day (QID) | INTRAMUSCULAR | Status: DC | PRN

## 2019-07-23 MED ORDER — GABAPENTIN 300 MG PO CAPS
300.0000 mg | ORAL_CAPSULE | Freq: Three times a day (TID) | ORAL | Status: DC
  Administered 2019-07-23 – 2019-07-25 (×5): 300 mg via ORAL
  Filled 2019-07-23 (×5): qty 1

## 2019-07-23 MED ORDER — SENNOSIDES-DOCUSATE SODIUM 8.6-50 MG PO TABS
1.0000 | ORAL_TABLET | Freq: Two times a day (BID) | ORAL | Status: DC
  Administered 2019-07-23 – 2019-07-25 (×4): 1 via ORAL
  Filled 2019-07-23 (×4): qty 1

## 2019-07-23 NOTE — Telephone Encounter (Signed)
Called and spoke with patient. She said sickle cell pain is not being controlled with current medication. She is say she is having to take more than prescribed to get some relief. She says she is staying hydrated with drinking water all day long and taking hot showers to try to relief the pain, however over the last 5 days pain has not been controlled. Please advise.

## 2019-07-23 NOTE — ED Provider Notes (Signed)
[name] EMERGENCY DEPARTMENT Provider Note   CSN: [id number] Arrival date & time: 07/23/19  1546     History Chief Complaint  Patient presents with  . Sickle Cell Pain Crisis    Lindsey [name] is a 49 y.o. female.  HPI HPI Comments: Lindsey [name] is a 49 y.o. female with a history of sickle cell disease who presents to the Emergency Department complaining of severe atraumatic right calf pain onset this morning.  She states her pain is similar to prior sickle cell pain crises.  She took 1600 mg of ibuprofen and 50 mg of oxycodone this morning.  She states it provided about 15 minutes of mild pain relief.  She states her current pain is 4/10.  She denies any other associated symptoms at this time.  She denies any other symptoms including fevers, chills, abdominal pain, chest pain, shortness of breath, nausea, vomiting, diarrhea, urinary changes.    Past Medical History:  Diagnosis Date  . 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 Opioid dependence in remission (HCC)   . Sickle cell crisis [facility])     Patient Active Problem List   Diagnosis Date Noted  . Current mild episode of major depressive disorder (HCC)   . Sickle cell crisis (HCC) 02/28/2019  . Opioid dependence in remission (HCC) 10/01/2017  . Sickle cell disease (HCC) 10/01/2017  . Chronic pain 10/01/2017  . 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 Leukocytosis 09/03/2016  . Sickle cell pain crisis (HCC) 07/20/2012  . 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 APPENDECTOMY    . CESAREAN SECTION    . OTHER SURGICAL HISTORY     c-section     OB History   No obstetric history on file.     Family History  Problem Relation Age of Onset  . Stroke Neg Hx        none that she knows of   . Seizures Neg Hx     Social History   Tobacco Use  . Smoking status: Current Some Day Smoker    Packs/day: 0.00    Types: Cigars  .  Smokeless tobacco: Never Used  Substance Use Topics  . Alcohol use: Yes    Comment: occasionally  . Drug use: No    Home Medications Prior to Admission medications   Medication Sig Start Date End Date Taking? Authorizing Provider  albuterol (VENTOLIN HFA) 108 (90 Base) MCG/ACT inhaler Inhale 2 puffs into the lungs every 6 (six) hours as needed for wheezing or shortness of breath. Patient not taking: Reported on 01/29/2019 12/19/18   Mike Gip, FNP  DULoxetine (CYMBALTA) 20 MG capsule Take 1 capsule (20 mg total) by mouth daily. 02/28/19 02/28/20  Massie Maroon, FNP  ergocalciferol (DRISDOL) 50000 units capsule Take 1 capsule (50,000 Units total) by mouth once a week. Patient not taking: Reported on 03/26/2019 10/06/17   Massie Maroon, FNP  gabapentin (NEURONTIN) 300 MG capsule Take 1 capsule by mouth three times daily 05/30/19   Massie Maroon, FNP  hydrOXYzine (ATARAX/VISTARIL) 25 MG tablet TAKE 1 TO 2 TABLETS(25 TO 50 MG) BY MOUTH AT BEDTIME 03/18/19   Jeanann Lewandowsky E, MD  ibuprofen (ADVIL) 800 MG tablet TAKE 1 TABLET(800 MG) BY MOUTH EVERY 8 HOURS AS NEEDED 12/03/18   Mike Gip, FNP  Oxycodone HCl 10 MG TABS Take 1 tablet (10 mg total) by mouth every 6 (six) hours as needed.  07/13/19   Dorena Dew, FNP    Allergies    Patient has no known allergies.  Review of Systems   Review of Systems  Constitutional: Negative for chills and fever.  Eyes: Negative for pain and visual disturbance.  Respiratory: Negative for shortness of breath.   Cardiovascular: Negative for chest pain and leg swelling.  Gastrointestinal: Negative for abdominal pain, diarrhea, nausea and vomiting.  Genitourinary: Negative for difficulty urinating and dysuria.  Musculoskeletal: Positive for myalgias. Negative for arthralgias and joint swelling.  Skin: Negative for color change and wound.  All other systems reviewed and are negative.  Physical Exam Updated Vital Signs BP 122/80 (BP  Location: Left Arm)   Pulse (!) 110   Temp 99.9 F (37.7 C) (Oral)   Resp (!) 24   LMP 07/07/2019   SpO2 99%   Physical Exam Vitals and nursing note reviewed.  Constitutional:      General: She is in acute distress.     Appearance: Normal appearance. She is normal weight. She is not ill-appearing, toxic-appearing or diaphoretic.     Comments: Well-developed African-American female.  She is lying in the semi-Fowlers position.  She is tearful throughout exam.  She is in obvious pain.  HENT:     Head: Normocephalic and atraumatic.     Right Ear: External ear normal.     Left Ear: External ear normal.     Nose: Nose normal.     Mouth/Throat:     Mouth: Mucous membranes are moist.     Pharynx: Oropharynx is clear. No oropharyngeal exudate or posterior oropharyngeal erythema.  Eyes:     General: No scleral icterus.       Right eye: No discharge.        Left eye: No discharge.     Extraocular Movements: Extraocular movements intact.     Pupils: Pupils are equal, round, and reactive to light.  Cardiovascular:     Rate and Rhythm: Normal rate and regular rhythm.     Pulses: Normal pulses.     Heart sounds: Normal heart sounds. No murmur. No friction rub. No gallop.   Pulmonary:     Effort: Pulmonary effort is normal. No respiratory distress.     Breath sounds: Normal breath sounds. No stridor. No wheezing, rhonchi or rales.     Comments: Lungs clear to auscultation bilaterally. Abdominal:     General: Abdomen is flat.     Tenderness: There is no abdominal tenderness.     Comments: No tenderness appreciated with deep palpation of all 4 abdominal quadrants.  Musculoskeletal:     Cervical back: Normal range of motion.     Comments: No tenderness noted with palpation of the bilateral calves.  Palpable pedal pulses.  Distal sensation intact.  No decreased warmth noted in the distal bilateral lower extremities.  Skin:    General: Skin is warm and dry.  Neurological:     General: No  focal deficit present.     Mental Status: She is alert and oriented to person, place, and time.  Psychiatric:        Mood and Affect: Mood normal.        Behavior: Behavior normal.    ED Results / Procedures / Treatments   Labs (all labs ordered are listed, but only abnormal results are displayed) Labs Reviewed  COMPREHENSIVE METABOLIC PANEL - Abnormal; Notable for the following components:      Result Value   Alkaline Phosphatase 34 (*)  All other components within normal limits  CBC WITH DIFFERENTIAL/PLATELET - Abnormal; Notable for the following components:   WBC 11.8 (*)    RBC 3.59 (*)    Hemoglobin 11.5 (*)    HCT 31.1 (*)    MCHC 37.0 (*)    Platelets 425 (*)    All other components within normal limits  RETICULOCYTES - Abnormal; Notable for the following components:   Retic Ct Pct 3.6 (*)    RBC. 3.55 (*)    Immature Retic Fract 19.9 (*)    All other components within normal limits  URINALYSIS, ROUTINE W REFLEX MICROSCOPIC - Abnormal; Notable for the following components:   APPearance HAZY (*)    Leukocytes,Ua LARGE (*)    Bacteria, UA FEW (*)    All other components within normal limits  URINE CULTURE  I-STAT BETA HCG BLOOD, ED ([facility], [facility], [facility] ONLY)  POC URINE PREG, ED    EKG None  Radiology DG Chest 1 View  Result Date: 07/23/2019 CLINICAL DATA:  Sickle cell crisis with chest pain EXAM: CHEST  1 VIEW COMPARISON:  11/21/2018 FINDINGS: No focal airspace disease or effusion. Stable cardiomediastinal silhouette. No pneumothorax. IMPRESSION: No active disease. Electronically Signed   By: Jasmine Pang M.D.   On: 07/23/2019 19:02   VAS [place] LOWER EXTREMITY VENOUS (DVT) (ONLY [facility] & [facility])  Result Date: 07/23/2019  Lower Venous DVTStudy Indications: Pain.  Performing Technologist: Levada Schilling RDMS, RVT  Examination Guidelines: A complete evaluation includes B-mode imaging, spectral Doppler, color Doppler, and power Doppler as needed of all accessible portions of each  vessel. Bilateral testing is considered an integral part of a complete examination. Limited examinations for reoccurring indications may be performed as noted. The reflux portion of the exam is performed with the patient in reverse Trendelenburg.  +---------+---------------+---------+-----------+----------+--------------+ RIGHT    CompressibilityPhasicitySpontaneityPropertiesThrombus Aging +---------+---------------+---------+-----------+----------+--------------+ CFV      Full           Yes      Yes                                 +---------+---------------+---------+-----------+----------+--------------+ SFJ      Full                                                        +---------+---------------+---------+-----------+----------+--------------+ FV Prox  Full                                                        +---------+---------------+---------+-----------+----------+--------------+ FV Mid   Full                                                        +---------+---------------+---------+-----------+----------+--------------+ FV DistalFull                                                        +---------+---------------+---------+-----------+----------+--------------+  PFV      Full                                                        +---------+---------------+---------+-----------+----------+--------------+ POP      Full           Yes      Yes                                 +---------+---------------+---------+-----------+----------+--------------+ PTV      Full                                                        +---------+---------------+---------+-----------+----------+--------------+ PERO     Full                                                        +---------+---------------+---------+-----------+----------+--------------+ GSV      Full                                                         +---------+---------------+---------+-----------+----------+--------------+   +----+---------------+---------+-----------+----------+--------------+ LEFTCompressibilityPhasicitySpontaneityPropertiesThrombus Aging +----+---------------+---------+-----------+----------+--------------+ CFV                                              Not visualized +----+---------------+---------+-----------+----------+--------------+ pt could not tolerate the evaluation of the left CFV.    Summary: RIGHT: - There is no evidence of deep vein thrombosis in the lower extremity.  - No cystic structure found in the popliteal fossa.   *See table(s) above for measurements and observations.    Preliminary     Procedures Procedures (including critical care time)  Medications Ordered in ED Medications  sodium chloride flush (NS) 0.9 % injection 3 mL (3 mLs Intravenous Not Given 07/23/19 1845)  HYDROmorphone (DILAUDID) injection 2 mg (2 mg Intravenous Given 07/23/19 1859)  HYDROmorphone (DILAUDID) injection 2 mg (2 mg Intravenous Given 07/23/19 1952)  HYDROmorphone (DILAUDID) injection 2 mg (2 mg Intravenous Given 07/23/19 2057)   ED Course  I have reviewed the triage vital signs and the nursing notes.  Pertinent labs & imaging results that were available during my care of the patient were reviewed by me and considered in my medical decision making (see chart for details).    MDM Rules/Calculators/A&P                      6:35 PM patient is a 49 year old African-American female that presents today with a sickle cell pain crisis.  In triage she is mildly febrile at 99.9 Fahrenheit, tachycardic at 110, tachypneic at 24.  Will order basic labs, UA, urine pregnancy, reticulocytes, chest x-ray,  ultrasound of the right lower extremity.  2 mg of Dilaudid given for pain. Will closely monitor and reassess.   7:42 PM reassessed patient and she denies any relief of her pain.  Chest x-ray and ultrasound of the right lower  extremity are both negative for acute processes.  Initial labs are reassuring.  Will give another 2 mg of Dilaudid and will monitor.  8:38 PM patient states she is still in significant pain.  Labs are reassuring.  Will give an additional dose of Dilaudid.  Will reexamine vitals.    10:20 PM Pt discussed with and evaluated by my attending physician Dr. Eber Hong. He agrees with the above plan. Pt states her pain is mildly alleviated but still appears in obvious pain.  Per records, she does not frequently visit the emergency department for sickle cell pain crises.  Due to her intractable pain patient states she feels comfortable with admission.  Will work with my attending physician and discuss with hospitalist team for possible admission.   Final Clinical Impression(s) / ED Diagnoses Final diagnoses:  Sickle cell pain crisis [facility])    Rx / DC Orders ED Discharge Orders    None       Placido Sou, PA-C 07/23/19 2243    Eber Hong, MD 07/25/19 2252

## 2019-07-23 NOTE — Discharge Instructions (Addendum)
Will continue Oxycontin 10 mg every 12 hours for greater pain control. Also, Oxycodone 10 mg every 6 hours for breakthrough pain.   Follow up with me in clinic as scheduled.     Sickle Cell Anemia, Adult  Sickle cell anemia is a condition where your red blood cells are shaped like sickles. Red blood cells carry oxygen through the body. Sickle-shaped cells do not live as long as normal red blood cells. They also clump together and block blood from flowing through the blood vessels. This prevents the body from getting enough oxygen. Sickle cell anemia causes organ damage and pain. It also increases the risk of infection. Follow these instructions at home: Medicines  Take over-the-counter and prescription medicines only as told by your doctor.  If you were prescribed an antibiotic medicine, take it as told by your doctor. Do not stop taking the antibiotic even if you start to feel better.  If you develop a fever, do not take medicines to lower the fever right away. Tell your doctor about the fever. Managing pain, stiffness, and swelling  Try these methods to help with pain: ? Use a heating pad. ? Take a warm bath. ? Distract yourself, such as by watching TV. Eating and drinking  Drink enough fluid to keep your pee (urine) clear or pale yellow. Drink more in hot weather and during exercise.  Limit or avoid alcohol.  Eat a healthy diet. Eat plenty of fruits, vegetables, whole grains, and lean protein.  Take vitamins and supplements as told by your doctor. Traveling  When traveling, keep these with you: ? Your medical information. ? The names of your doctors. ? Your medicines.  If you need to take an airplane, talk to your doctor first. Activity  Rest often.  Avoid exercises that make your heart beat much faster, such as jogging. General instructions  Do not use products that have nicotine or tobacco, such as cigarettes and e-cigarettes. If you need help quitting, ask your  doctor.  Consider wearing a medical alert bracelet.  Avoid being in high places (high altitudes), such as mountains.  Avoid very hot or cold temperatures.  Avoid places where the temperature changes a lot.  Keep all follow-up visits as told by your doctor. This is important. Contact a doctor if:  A joint hurts.  Your feet or hands hurt or swell.  You feel tired (fatigued). Get help right away if:  You have symptoms of infection. These include: ? Fever. ? Chills. ? Being very tired. ? Irritability. ? Poor eating. ? Throwing up (vomiting).  You feel dizzy or faint.  You have new stomach pain, especially on the left side.  You have a an erection (priapism) that lasts more than 4 hours.  You have numbness in your arms or legs.  You have a hard time moving your arms or legs.  You have trouble talking.  You have pain that does not go away when you take medicine.  You are short of breath.  You are breathing fast.  You have a long-term cough.  You have pain in your chest.  You have a bad headache.  You have a stiff neck.  Your stomach looks bloated even though you did not eat much.  Your skin is pale.  You suddenly cannot see well. Summary  Sickle cell anemia is a condition where your red blood cells are shaped like sickles.  Follow your doctor's advice on ways to manage pain, food to eat, activities to do,  and steps to take for safe travel.  Get medical help right away if you have any signs of infection, such as a fever. This information is not intended to replace advice given to you by your health care provider. Make sure you discuss any questions you have with your health care provider. Document Revised: 08/17/2018 Document Reviewed: 05/31/2016 Elsevier Patient Education  Media.

## 2019-07-23 NOTE — ED Notes (Signed)
Pt. Waiting for room. In hallway bed currently. Can't start PCA pump in hallway. MD aware.

## 2019-07-23 NOTE — ED Triage Notes (Signed)
C/o sickle cell crisis with sharp pain to R lower leg since this morning.  Pt tearful at triage.

## 2019-07-23 NOTE — Telephone Encounter (Signed)
-----   Message from Massie Maroon, [place] sent at 07/23/2019  3:15 PM EDT ----- Regarding: lab results Schedule an appointment for medication management. We will try to fit her in on next Tuesday.    [name]  APRN, MSN, FNP-C Patient Care [facility] [address] [place], [place] [address] [phone]

## 2019-07-23 NOTE — ED Provider Notes (Signed)
This patient is a 49 year old female, she has known sickle cell disease, she does not visit the emergency department frequently.  She follows closely with the sickle cell clinic.  Over the last week she has had some progressive pain in her right calf, this has been gradually worsening, she denies any swelling in that area.  She has been taking her pain medication at home including oxycodone tablets however she has now run out of medications and is in quite a bit of pain.  She denies chest pain or shortness of breath.  Vital signs reflect a mild tachycardia to one hundred and ten, a temperature of 99.9 and a blood pressure of 102/66.  Labs reviewed, there is minimal if any anemia, normal bilirubin however she does make reticulocytes and does not appear to be aplastic.  Chest x-ray does not show any signs of infiltrate, and the ultrasound of the leg shows no signs of DVT.  The calf has no edema, there is no changes in the skin, no redness, no warmth.  She has been given three doses of Dilaudid, she still has a significant pain, rated 6 out of 10, she is very tearful and has no pain medication at home.  She will be admitted to the hospital for sickle cell pain crisis.  Hospitalist paged at 10:23 PM  Discussed with Dr. Julian Reil who will come to admit  Vitals:   07/23/19 1549 07/23/19 1947  BP: 122/80 102/66  Pulse: (!) 110   Resp: (!) 24 20  Temp: 99.9 F (37.7 C)   TempSrc: Oral   SpO2: 99% 99%    EKG Interpretation  Date/Time:  Tuesday July 23 2019 20:49:33 EDT Ventricular Rate:  77 PR Interval:  178 QRS Duration: 72 QT Interval:  376 QTC Calculation: 425 R Axis:   55 Text Interpretation: Normal sinus rhythm Normal ECG since last tracing no significant change Confirmed by Eber Hong ([id number]) on 07/23/2019 10:23:45 PM      Medical screening examination/treatment/procedure(s) were conducted as a shared visit with non-physician practitioner(s) and myself.  I personally evaluated  the patient during the encounter.  Clinical Impression:   Final diagnoses:  Sickle cell pain crisis (HCC)         Eber Hong, MD 07/23/19 2250

## 2019-07-23 NOTE — Progress Notes (Signed)
Right lower extremity venous duplex complete.  Please see CV Proc tab for preliminary results.  Preliminary results called to Dr. Hyacinth Meeker @ 19:20. Levin Bacon- RDMS, RVT 7:21 PM  07/23/2019

## 2019-07-23 NOTE — H&P (Signed)
History and Physical    Lindsey [name] OAC:[id number] DOB: May 14, 1970 DOA: 07/23/2019  PCP: Lindsey Dew, FNP  Patient coming from: Home  I have personally briefly reviewed patient's old medical records in [place]  Chief Complaint: Sickle cell pain crisis  HPI: Lindsey [name] is a 49 y.o. female with medical history significant of Opiate dependence, sickle cell disease.  Pt with pain for past several days.  Pain in R calf.  No swelling.  Pain gradually worsening.  Was managing with home oxycodone but now ran out of meds.  No CP, no SOB.   ED Course: [place] of leg neg for DVT  CXR neg.  Pain persists despite dilaudid.  Admit for pain crisis requested.  No erythema nor warmth nor skin changes to leg.   Review of Systems: As per HPI, otherwise all review of systems negative.  Past Medical History:  Diagnosis Date  . Opiate abuse, episodic (Lindsey [name]) 09/25/2017  . Opioid dependence in remission (Lindsey [name])   . Sickle cell crisis [facility])     Past Surgical History:  Procedure Laterality Date  . APPENDECTOMY    . CESAREAN SECTION    . OTHER SURGICAL HISTORY     c-section     reports that she has been smoking cigars. She has been smoking about 0.00 packs per day. She has never used smokeless tobacco. She reports current alcohol use. She reports that she does not use drugs.  No Known Allergies  Family History  Problem Relation Age of Onset  . Stroke Neg Hx        none that she knows of   . Seizures Neg Hx      Prior to Admission medications   Medication Sig Start Date End Date Taking? Authorizing Provider  gabapentin (NEURONTIN) 300 MG capsule Take 1 capsule by mouth three times daily Patient taking differently: Take 300 mg by mouth 3 (three) times daily.  05/30/19  Yes Lindsey Dew, FNP  ibuprofen (ADVIL) 800 MG tablet TAKE 1 TABLET(800 MG) BY MOUTH EVERY 8 HOURS AS NEEDED Patient taking differently: Take 800 mg by mouth every 6 (six) hours as needed for moderate  pain.  12/03/18  Yes Lanae Boast, FNP  Oxycodone HCl 10 MG TABS Take 1 tablet (10 mg total) by mouth every 6 (six) hours as needed. Patient taking differently: Take 10 mg by mouth every 6 (six) hours as needed (For pain).  07/13/19  Yes Lindsey Dew, FNP    Physical Exam: Vitals:   07/23/19 1549 07/23/19 1947  BP: 122/80 102/66  Pulse: (!) 110   Resp: (!) 24 20  Temp: 99.9 F (37.7 C)   TempSrc: Oral   SpO2: 99% 99%    Constitutional: NAD, calm, comfortable Eyes: PERRL, lids and conjunctivae normal ENMT: Mucous membranes are moist. Posterior pharynx clear of any exudate or lesions.Normal dentition.  Neck: normal, supple, no masses, no thyromegaly Respiratory: clear to auscultation bilaterally, no wheezing, no crackles. Normal respiratory effort. No accessory muscle use.  Cardiovascular: Regular rate and rhythm, no murmurs / rubs / gallops. No extremity edema. 2+ pedal pulses. No carotid bruits.  Abdomen: no tenderness, no masses palpated. No hepatosplenomegaly. Bowel sounds positive.  Musculoskeletal: no clubbing / cyanosis. No joint deformity upper and lower extremities. Good ROM, no contractures. Normal muscle tone.  Skin: no rashes, lesions, ulcers. No induration Neurologic: CN 2-12 grossly intact. Sensation intact, DTR normal. Strength 5/5 in all 4.  Psychiatric: Normal judgment and insight. Alert and oriented  x 3. Normal mood.    Labs on Admission: I have personally reviewed following labs and imaging studies  CBC: Recent Labs  Lab 07/23/19 1716  WBC 11.8*  NEUTROABS 7.7  HGB 11.5*  HCT 31.1*  MCV 86.6  PLT 425*   Basic Metabolic Panel: Recent Labs  Lab 07/23/19 1716  NA 139  K 4.1  CL 111  CO2 22  GLUCOSE 96  BUN 8  CREATININE 0.82  CALCIUM 9.2   GFR: Estimated Creatinine Clearance: 73.5 mL/min (by C-G formula based on SCr of 0.82 mg/dL). Liver Function Tests: Recent Labs  Lab 07/23/19 1716  AST 19  ALT 15  ALKPHOS 34*  BILITOT 1.1  PROT  7.0  ALBUMIN 4.2   No results for input(s): LIPASE, AMYLASE in the last 168 hours. No results for input(s): AMMONIA in the last 168 hours. Coagulation Profile: No results for input(s): INR, PROTIME in the last 168 hours. Cardiac Enzymes: No results for input(s): CKTOTAL, CKMB, CKMBINDEX, TROPONINI in the last 168 hours. BNP (last 3 results) No results for input(s): PROBNP in the last 8760 hours. HbA1C: No results for input(s): HGBA1C in the last 72 hours. CBG: No results for input(s): GLUCAP in the last 168 hours. Lipid Profile: No results for input(s): CHOL, HDL, LDLCALC, TRIG, CHOLHDL, LDLDIRECT in the last 72 hours. Thyroid Function Tests: No results for input(s): TSH, T4TOTAL, FREET4, T3FREE, THYROIDAB in the last 72 hours. Anemia Panel: Recent Labs    07/23/19 1716  RETICCTPCT 3.6*   Urine analysis:    Component Value Date/Time   COLORURINE YELLOW 07/23/2019 1922   APPEARANCEUR HAZY (A) 07/23/2019 1922   LABSPEC 1.009 07/23/2019 1922   PHURINE 5.0 07/23/2019 1922   GLUCOSEU NEGATIVE 07/23/2019 1922   HGBUR NEGATIVE 07/23/2019 1922   BILIRUBINUR NEGATIVE 07/23/2019 1922   BILIRUBINUR neg 03/26/2019 1147   KETONESUR NEGATIVE 07/23/2019 1922   PROTEINUR NEGATIVE 07/23/2019 1922   UROBILINOGEN 0.2 03/26/2019 1147   UROBILINOGEN 1.0 12/25/2012 1755   NITRITE NEGATIVE 07/23/2019 1922   LEUKOCYTESUR LARGE (A) 07/23/2019 1922    Radiological Exams on Admission: DG Chest 1 View  Result Date: 07/23/2019 CLINICAL DATA:  Sickle cell crisis with chest pain EXAM: CHEST  1 VIEW COMPARISON:  11/21/2018 FINDINGS: No focal airspace disease or effusion. Stable cardiomediastinal silhouette. No pneumothorax. IMPRESSION: No active disease. Electronically Signed   By: Jasmine Pang M.D.   On: 07/23/2019 19:02   VAS [place] LOWER EXTREMITY VENOUS (DVT) (ONLY [facility] & [facility])  Result Date: 07/23/2019  Lower Venous DVTStudy Indications: Pain.  Performing Technologist: Levada Schilling RDMS, RVT   Examination Guidelines: A complete evaluation includes B-mode imaging, spectral Doppler, color Doppler, and power Doppler as needed of all accessible portions of each vessel. Bilateral testing is considered an integral part of a complete examination. Limited examinations for reoccurring indications may be performed as noted. The reflux portion of the exam is performed with the patient in reverse Trendelenburg.  +---------+---------------+---------+-----------+----------+--------------+ RIGHT    CompressibilityPhasicitySpontaneityPropertiesThrombus Aging +---------+---------------+---------+-----------+----------+--------------+ CFV      Full           Yes      Yes                                 +---------+---------------+---------+-----------+----------+--------------+ SFJ      Full                                                        +---------+---------------+---------+-----------+----------+--------------+  FV Prox  Full                                                        +---------+---------------+---------+-----------+----------+--------------+ FV Mid   Full                                                        +---------+---------------+---------+-----------+----------+--------------+ FV DistalFull                                                        +---------+---------------+---------+-----------+----------+--------------+ PFV      Full                                                        +---------+---------------+---------+-----------+----------+--------------+ POP      Full           Yes      Yes                                 +---------+---------------+---------+-----------+----------+--------------+ PTV      Full                                                        +---------+---------------+---------+-----------+----------+--------------+ PERO     Full                                                         +---------+---------------+---------+-----------+----------+--------------+ GSV      Full                                                        +---------+---------------+---------+-----------+----------+--------------+   +----+---------------+---------+-----------+----------+--------------+ LEFTCompressibilityPhasicitySpontaneityPropertiesThrombus Aging +----+---------------+---------+-----------+----------+--------------+ CFV                                              Not visualized +----+---------------+---------+-----------+----------+--------------+ pt could not tolerate the evaluation of the left CFV.    Summary: RIGHT: - There is no evidence of deep vein thrombosis in the lower extremity.  - No cystic structure found in the popliteal fossa.   *See table(s) above for measurements and observations. Electronically signed by Harold Barban MD on 07/23/2019 at 9:44:55  PM.    Final     EKG: Independently reviewed.  Assessment/Plan Active Problems:   Sickle cell pain crisis (HCC)    1. Sickle cell pain crisis - 1. Sickle cell pathway 2. Scheduled toradol 3. Cont home neurontin 4. Dilaudid PCA  DVT prophylaxis: Lovenox Code Status: Full Family Communication: No family in room Disposition Plan: Home when pain controlled Consults called: None Admission status: Admit to inpatient  Severity of Illness: The appropriate patient status for this patient is INPATIENT. Inpatient status is judged to be reasonable and necessary in order to provide the required intensity of service to ensure the patient's safety. The patient's presenting symptoms, physical exam findings, and initial radiographic and laboratory data in the context of their chronic comorbidities is felt to place them at high risk for further clinical deterioration. Furthermore, it is not anticipated that the patient will be medically stable for discharge from the hospital within 2 midnights of admission. The following  factors support the patient status of inpatient.   IP status for dilaudid PCA for sickle cell crisis.   * I certify that at the point of admission it is my clinical judgment that the patient will require inpatient hospital care spanning beyond 2 midnights from the point of admission due to high intensity of service, high risk for further deterioration and high frequency of surveillance required.*    Shantal Roan M. DO [facility]  How to contact the [facility] Attending or Consulting provider 7A - 7P or covering provider during after hours 7P -7A, for this patient?  1. Check the care team in [facility] and look for a) attending/consulting TRH provider listed and b) the [facility] team listed 2. Log into [email or web address]  Amion Physician Scheduling and messaging for groups and whole hospitals  On call and physician scheduling software for group practices, residents, hospitalists and other medical providers for call, clinic, rotation and shift schedules. OnCall Enterprise is a hospital-wide system for scheduling doctors and paging doctors on call. EasyPlot is for scientific plotting and data analysis.  [email or web address]  and use [place] universal password to access. If you do not have the password, please contact the hospital operator.  3. Locate the [facility] provider you are looking for under [facility] and page to a number that you can be directly reached. 4. If you still have difficulty reaching the provider, please page the [facility] (Director on Call) for the Hospitalists listed on amion for assistance.  07/23/2019, 11:08 PM

## 2019-07-23 NOTE — Telephone Encounter (Signed)
Called and spoke with patient advised that we need to see her in office for medication management appointment. Appointment was scheduled for 10:20am for 07/30/2019. Thanks!

## 2019-07-24 DIAGNOSIS — D5700 Hb-SS disease with crisis, unspecified: Secondary | ICD-10-CM

## 2019-07-24 LAB — SARS CORONAVIRUS 2 (TAT 6-24 HRS): SARS Coronavirus 2: NEGATIVE

## 2019-07-24 LAB — URINE CULTURE: Culture: <10000 — AB

## 2019-07-24 LAB — LACTIC ACID, PLASMA: Lactic Acid, Venous: 0.8 mmol/L (ref 0.5–1.9)

## 2019-07-24 MED ORDER — OXYCODONE HCL 5 MG PO TABS
10.0000 mg | ORAL_TABLET | ORAL | Status: DC | PRN
  Administered 2019-07-24: 10 mg via ORAL
  Filled 2019-07-24 (×2): qty 2

## 2019-07-24 MED ORDER — HYDROMORPHONE 1 MG/ML IV SOLN
INTRAVENOUS | Status: DC
  Administered 2019-07-24: 1.5 mg via INTRAVENOUS
  Administered 2019-07-24 (×2): 1 mg via INTRAVENOUS
  Administered 2019-07-24 – 2019-07-25 (×2): 0.5 mg via INTRAVENOUS
  Administered 2019-07-25: 1 mg via INTRAVENOUS

## 2019-07-24 MED ORDER — OXYCODONE HCL ER 10 MG PO T12A
10.0000 mg | EXTENDED_RELEASE_TABLET | Freq: Two times a day (BID) | ORAL | Status: DC
  Administered 2019-07-24 – 2019-07-25 (×3): 10 mg via ORAL
  Filled 2019-07-24 (×3): qty 1

## 2019-07-24 NOTE — Progress Notes (Signed)
Report handoff given to [facility]. [facility] here now to transport pt.

## 2019-07-24 NOTE — Progress Notes (Signed)
Report called to [facility] 6E and given to [place].

## 2019-07-24 NOTE — Progress Notes (Signed)
Subjective: Lindsey [name], a 49 year old female with a medical history significant for sickle cell disease, chronic pain syndrome, opiate dependence and tolerance and history of anemia of chronic disease was admitted for sickle cell pain crisis.  Patient says that her pain has not been controlled with home pain medications over the past several weeks.  She has been attempting to manage pain crisis at home without success.  Patient says that pain has improved overnight with IV dilaudid PCA.  Current pain intensity is 4-5/10 primarily to right lower extremity.  Patient denies headache, chest pain, shortness of breath, dysuria, nausea, vomiting, or diarrhea.  Objective:  Vital signs in last 24 hours:  Vitals:   07/24/19 1258 07/24/19 1325 07/24/19 1440 07/24/19 1451  BP:  (!) 92/50    Pulse:  63    Resp: 14 15 16 17   Temp:  99 F (37.2 C)    TempSrc:  Oral    SpO2: 99% 96% 96% 100%    Intake/Output from previous day:  No intake or output data in the 24 hours ending 07/24/19 1604  Physical Exam: General: Alert, awake, oriented x3, in no acute distress.  HEENT: [place]/AT PEERL, EOMI Neck: Trachea midline,  no masses, no thyromegal,y no JVD, no carotid bruit OROPHARYNX:  Moist, No exudate/ erythema/lesions.  Heart: Regular rate and rhythm, without murmurs, rubs, gallops, PMI non-displaced, no heaves or thrills on palpation.  Lungs: Clear to auscultation, no wheezing or rhonchi noted. No increased vocal fremitus resonant to percussion  Abdomen: Soft, nontender, nondistended, positive bowel sounds, no masses no hepatosplenomegaly noted..  Neuro: No focal neurological deficits noted cranial nerves II through XII grossly intact. DTRs 2+ bilaterally upper and lower extremities. Strength 5 out of 5 in bilateral upper and lower extremities. Musculoskeletal: No warm swelling or erythema around joints, no spinal tenderness noted. Psychiatric: Patient alert and oriented x3, good insight and cognition,  good recent to remote recall. Lymph node survey: No cervical axillary or inguinal lymphadenopathy noted.  Lab Results:  Basic Metabolic Panel:    Component Value Date/Time   NA 139 07/23/2019 1716   NA 142 03/30/2018 0852   K 4.1 07/23/2019 1716   CL 111 07/23/2019 1716   CO2 22 07/23/2019 1716   BUN 8 07/23/2019 1716   BUN 10 03/30/2018 0852   CREATININE 0.82 07/23/2019 1716   GLUCOSE 96 07/23/2019 1716   CALCIUM 9.2 07/23/2019 1716   CBC:    Component Value Date/Time   WBC 11.8 (H) 07/23/2019 1716   HGB 11.5 (L) 07/23/2019 1716   HGB 10.2 (L) 12/19/2018 1055   HCT 31.1 (L) 07/23/2019 1716   HCT 30.6 (L) 12/19/2018 1055   PLT 425 (H) 07/23/2019 1716   PLT 488 (H) 12/19/2018 1055   MCV 86.6 07/23/2019 1716   MCV 94 12/19/2018 1055   NEUTROABS 7.7 07/23/2019 1716   NEUTROABS 6.3 12/19/2018 1055   LYMPHSABS 3.1 07/23/2019 1716   LYMPHSABS 3.2 (H) 12/19/2018 1055   MONOABS 0.8 07/23/2019 1716   EOSABS 0.2 07/23/2019 1716   EOSABS 0.5 (H) 12/19/2018 1055   BASOSABS 0.0 07/23/2019 1716   BASOSABS 0.1 12/19/2018 1055    Recent Results (from the past 240 hour(s))  Urine culture     Status: Abnormal   Collection Time: 07/23/19  6:52 PM   Specimen: Urine, Clean Catch  Result Value Ref Range Status   Specimen Description URINE, CLEAN CATCH  Final   Special Requests NONE  Final   Culture (A)  Final    <10,000 COLONIES/mL INSIGNIFICANT GROWTH Performed at [facility] [address] [place], [place] [address]    Report Status 07/24/2019 FINAL  Final  Blood culture (routine x 2)     Status: None (Preliminary result)   Collection Time: 07/23/19 10:30 PM   Specimen: BLOOD  Result Value Ref Range Status   Specimen Description BLOOD RIGHT ANTECUBITAL  Final   Special Requests   Final    BOTTLES DRAWN AEROBIC AND ANAEROBIC Blood Culture adequate volume   Culture   Final    NO GROWTH < 24 HOURS Performed at [facility], [place] [address] [place], [place]  [address]    Report Status PENDING  Incomplete  Blood culture (routine x 2)     Status: None (Preliminary result)   Collection Time: 07/23/19 10:45 PM   Specimen: BLOOD LEFT HAND  Result Value Ref Range Status   Specimen Description BLOOD LEFT HAND  Final   Special Requests   Final    BOTTLES DRAWN AEROBIC AND ANAEROBIC Blood Culture adequate volume   Culture   Final    NO GROWTH < 24 HOURS Performed at [facility], [place] [address] [place], [place] [address]    Report Status PENDING  Incomplete  SARS CORONAVIRUS 2 (TAT 6-24 HRS) Nasopharyngeal Nasopharyngeal Swab     Status: None   Collection Time: 07/23/19 10:51 PM   Specimen: Nasopharyngeal Swab  Result Value Ref Range Status   SARS Coronavirus 2 NEGATIVE NEGATIVE Final    Comment: (NOTE) SARS-CoV-2 target nucleic acids are NOT DETECTED. The SARS-CoV-2 RNA is generally detectable in upper and lower respiratory specimens during the acute phase of infection. Negative results do not preclude SARS-CoV-2 infection, do not rule out co-infections with other pathogens, and should not be used as the sole basis for treatment or other patient management decisions. Negative results must be combined with clinical observations, patient history, and epidemiological information. The expected result is Negative. Fact Sheet for Patients: SugarRoll.be Fact Sheet for Healthcare Providers: https://www.woods-mathews.com/ This test is not yet approved or cleared by the [place] FDA and  has been authorized for detection and/or diagnosis of SARS-CoV-2 by FDA under an Emergency Use Authorization (EUA). This EUA will remain  in effect (meaning this test can be used) for the duration of the COVID-19 declaration under Section 56 4(b)(1) of the Act, 21 U.S.C. section 360bbb-3(b)(1), unless the authorization is terminated or revoked sooner. Performed at [facility], [place] [address] [place],  [place] [address]     Studies/Results: DG Chest 1 View  Result Date: 07/23/2019 CLINICAL DATA:  Sickle cell crisis with chest pain EXAM: CHEST  1 VIEW COMPARISON:  11/21/2018 FINDINGS: No focal airspace disease or effusion. Stable cardiomediastinal silhouette. No pneumothorax. IMPRESSION: No active disease. Electronically Signed   By: Donavan Foil M.D.   On: 07/23/2019 19:02   VAS [place] LOWER EXTREMITY VENOUS (DVT) (ONLY [facility] & [facility])  Result Date: 07/23/2019  Lower Venous DVTStudy Indications: Pain.  Performing Technologist: Antonieta Pert RDMS, RVT  Examination Guidelines: A complete evaluation includes B-mode imaging, spectral Doppler, color Doppler, and power Doppler as needed of all accessible portions of each vessel. Bilateral testing is considered an integral part of a complete examination. Limited examinations for reoccurring indications may be performed as noted. The reflux portion of the exam is performed with the patient in reverse Trendelenburg.  +---------+---------------+---------+-----------+----------+--------------+ RIGHT    CompressibilityPhasicitySpontaneityPropertiesThrombus Aging +---------+---------------+---------+-----------+----------+--------------+ CFV  Full           Yes      Yes                                 +---------+---------------+---------+-----------+----------+--------------+ SFJ      Full                                                        +---------+---------------+---------+-----------+----------+--------------+ FV Prox  Full                                                        +---------+---------------+---------+-----------+----------+--------------+ FV Mid   Full                                                        +---------+---------------+---------+-----------+----------+--------------+ FV DistalFull                                                         +---------+---------------+---------+-----------+----------+--------------+ PFV      Full                                                        +---------+---------------+---------+-----------+----------+--------------+ POP      Full           Yes      Yes                                 +---------+---------------+---------+-----------+----------+--------------+ PTV      Full                                                        +---------+---------------+---------+-----------+----------+--------------+ PERO     Full                                                        +---------+---------------+---------+-----------+----------+--------------+ GSV      Full                                                        +---------+---------------+---------+-----------+----------+--------------+   +----+---------------+---------+-----------+----------+--------------+  LEFTCompressibilityPhasicitySpontaneityPropertiesThrombus Aging +----+---------------+---------+-----------+----------+--------------+ CFV                                              Not visualized +----+---------------+---------+-----------+----------+--------------+ pt could not tolerate the evaluation of the left CFV.    Summary: RIGHT: - There is no evidence of deep vein thrombosis in the lower extremity.  - No cystic structure found in the popliteal fossa.   *See table(s) above for measurements and observations. Electronically signed by Coral Else MD on 07/23/2019 at 9:44:55 PM.    Final     Medications: Scheduled Meds: . enoxaparin (LOVENOX) injection  40 mg Subcutaneous Q24H  . gabapentin  300 mg Oral TID  . HYDROmorphone   Intravenous Q4H  . ketorolac  30 mg Intravenous Q6H  . oxyCODONE  10 mg Oral Q12H  . senna-docusate  1 tablet Oral BID  . sodium chloride flush  3 mL Intravenous Once   Continuous Infusions: . diphenhydrAMINE     PRN Meds:.diphenhydrAMINE **OR** diphenhydrAMINE,  naloxone **AND** sodium chloride flush, ondansetron (ZOFRAN) IV, oxyCODONE, polyethylene glycol  Consultants:  None  Procedures:  None  Antibiotics:  None  Assessment/Plan: Active Problems:   Sickle cell pain crisis (HCC)  Sickle cell disease with pain crisis: Continue IV fluids at [facility] Reduce IV Dilaudid PCA to settings of 0.5 mg, 10-minute lockout, and 1 mg/h. Oxycodone 10 mg every 4 hours as needed for severe breakthrough pain Also, initiate OxyContin 10 mg every 12 hours  Chronic pain syndrome: Continue home medications  Sickle cell anemia: Patient's hemoglobin is consistent with her baseline.  There is no clinical indication for blood transfusion at this time.  Continue to follow CBC, repeat in a.m.  Code Status: Full Code Family Communication: N/A Disposition Plan: Not yet ready for discharge  Tawnya Pujol Rennis Petty  APRN, MSN, FNP-C [facility] [address] [place], [place] [address] [phone]  If 5PM-8AM, please contact night-coverage.  07/24/2019, 4:04 PM  LOS: 1 day

## 2019-07-24 NOTE — Progress Notes (Signed)
PHARMACY - PHYSICIAN COMMUNICATION CRITICAL VALUE ALERT - BLOOD CULTURE IDENTIFICATION (BCID)  Lindsey [name] is an 49 y.o. female who presented to [facility] on 07/23/2019 with a chief complaint of sickle cell pain crisis   Name of physician (or Provider) Contacted: Cherylin Mylar  Current antibiotics: none  Changes to prescribed antibiotics recommended:  none  No results found for this or any previous visit.  Arley Phenix RPh 07/24/2019, 9:28 PM

## 2019-07-25 DIAGNOSIS — G894 Chronic pain syndrome: Secondary | ICD-10-CM

## 2019-07-25 LAB — URINALYSIS, COMPLETE (UACMP) WITH MICROSCOPIC
Bilirubin Urine: NEGATIVE
Glucose, UA: NEGATIVE mg/dL
Hgb urine dipstick: NEGATIVE
Ketones, ur: NEGATIVE mg/dL
Nitrite: NEGATIVE
Protein, ur: NEGATIVE mg/dL
Specific Gravity, Urine: 1.012 (ref 1.005–1.030)
pH: 5 (ref 5.0–8.0)

## 2019-07-25 LAB — CBC
HCT: 26.7 % — ABNORMAL LOW (ref 36.0–46.0)
Hemoglobin: 10 g/dL — ABNORMAL LOW (ref 12.0–15.0)
MCH: 32.4 pg (ref 26.0–34.0)
MCHC: 37.5 g/dL — ABNORMAL HIGH (ref 30.0–36.0)
MCV: 86.4 fL (ref 80.0–100.0)
Platelets: 372 10*3/uL (ref 150–400)
RBC: 3.09 MIL/uL — ABNORMAL LOW (ref 3.87–5.11)
RDW: 13.4 % (ref 11.5–15.5)
WBC: 9.7 10*3/uL (ref 4.0–10.5)
nRBC: 0.2 % (ref 0.0–0.2)

## 2019-07-25 MED ORDER — OXYCODONE HCL ER 10 MG PO T12A: 10.0000 mg | EXTENDED_RELEASE_TABLET | Freq: Two times a day (BID) | ORAL | 0 refills | Status: DC

## 2019-07-25 MED ORDER — OXYCODONE HCL 10 MG PO TABS: 10.0000 mg | ORAL_TABLET | Freq: Four times a day (QID) | ORAL | 0 refills | Status: DC | PRN

## 2019-07-25 NOTE — Discharge Summary (Addendum)
Physician Discharge Summary  Lindsey [name] BEM:[id number] DOB: 1971/02/11 DOA: 07/23/2019  PCP: Massie Maroon, FNP  Admit date: 07/23/2019  Discharge date: 07/25/2019  Discharge Diagnoses:  Principal Problem:   Sickle cell pain crisis [facility]) Active Problems:   Chronic pain   Discharge Condition: Stable  Disposition:  Follow-up Information    Massie Maroon, FNP. Schedule an appointment as soon as possible for a visit in 1 day.   Specialty: Family Medicine Why: Please schedule a follow-up appointment Contact information: [address] [place] [address] [phone]        Go to  [facility] EMERGENCY DEPARTMENT.   Specialty: Emergency Medicine Why: If symptoms worsen Contact information: [address] [id number] mc [place] [address] [phone]         Pt is discharged home in good condition and is to follow up with Massie Maroon, FNP this week to have labs evaluated. OPFYTWK Molinaro is instructed to increase activity slowly and balance with rest for the next few days, and use prescribed medication to complete treatment of pain  Diet: Regular Wt Readings from Last 3 Encounters:  07/10/19 68.9 kg  03/26/19 70.8 kg  02/20/19 70.3 kg    History of present illness:  Lindsey [name], a 49 year old female with a medical history significant for sickle cell disease type [place], opiate dependence and tolerance, and history of anemia of chronic disease presented to ER with right lower extremity pain.  Pain is been present over the past several weeks and has been unrelieved by home medication.  She denies swelling or recent injury.  She says that pain gradually worsened.  Attempted to manage pain at home with oxycodone, but has now run out of medications.  Patient denies headache, chest pain, shortness of breath, urinary symptoms, nausea, vomiting, or diarrhea.  Patient denied warmth or redness to right lower  extremity.  Emergency room course: Patient underwent ultrasound of right leg, negative for DVT.  Chest x-ray shows no acute cardiopulmonary process. Pain persists despite IV Dilaudid, IV Toradol, and IV fluids.  Patient admitted to MedSurg for management of sickle cell pain crisis.  Hospital Course:  Sickle cell disease with pain crisis: Patient was admitted to MedSurg for management of pain crisis.  Pain was managed appropriately with IV fluids, IV Dilaudid via PCA, which was weaned appropriately, and IV Toradol.  A trial of OxyContin 10 mg every 12 hours was started.  Patient responded positively to medication addition.  We will also continue oxycodone 10 mg every 6 hours as needed for severe breakthrough pain. Patient will follow-up with this provider in clinic on Tuesday, March 23 for medication management.  Patient is requesting discharge.  Pain intensity is 1/10.  She is alert, oriented, and ambulating without assistance.  Patient is afebrile and oxygen saturation is 100% on RA.  Patient will discharge home in a hemodynamically stable condition.   Discharge Exam: Vitals:   07/25/19 0748 07/25/19 0934  BP:  104/66  Pulse:  63  Resp: 16 12  Temp:  99.3 F (37.4 C)  SpO2: 97% 99%   Vitals:   07/25/19 0500 07/25/19 0644 07/25/19 0748 07/25/19 0934  BP:  (!) 102/57  104/66  Pulse:  63  63  Resp: 16 13 16 12   Temp:  98.5 F (36.9 C)  99.3 F (37.4 C)  TempSrc:  Oral  Oral  SpO2: 100% 96% 97% 99%    General appearance : Awake, alert, not  in any distress. Speech Clear. Not toxic looking HEENT: Atraumatic and Normocephalic, pupils equally reactive to light and accomodation Neck: Supple, no JVD. No cervical lymphadenopathy.  Chest: Good air entry bilaterally, no added sounds  CVS: S1 S2 regular, no murmurs.  Abdomen: Bowel sounds present, Non tender and not distended with no gaurding, rigidity or rebound. Extremities: B/L Lower Ext shows no edema, both legs are warm to  touch Neurology: Awake alert, and oriented X 3, CN II-XII intact, Non focal Skin: No Rash  Discharge Instructions  Discharge Instructions    Discharge patient   Complete by: As directed    Discharge disposition: 01-Home or Self Care   Discharge patient date: 07/25/2019     Allergies as of 07/25/2019   No Known Allergies     Medication List    TAKE these medications   gabapentin 300 MG capsule Commonly known as: NEURONTIN Take 1 capsule by mouth three times daily What changed:   how much to take  how to take this  when to take this  additional instructions   ibuprofen 800 MG tablet Commonly known as: ADVIL TAKE 1 TABLET(800 MG) BY MOUTH EVERY 8 HOURS AS NEEDED What changed: See the new instructions.   oxyCODONE 10 mg 12 hr tablet Commonly known as: OXYCONTIN Take 1 tablet (10 mg total) by mouth every 12 (twelve) hours. What changed: You were already taking a medication with the same name, and this prescription was added. Make sure you understand how and when to take each.   Oxycodone HCl 10 MG Tabs Take 1 tablet (10 mg total) by mouth every 6 (six) hours as needed. Start taking on: July 27, 2019 What changed: These instructions start on July 27, 2019. If you are unsure what to do until then, ask your doctor or other care provider.       The results of significant diagnostics from this hospitalization (including imaging, microbiology, ancillary and laboratory) are listed below for reference.    Significant Diagnostic Studies: DG Chest 1 View  Result Date: 07/23/2019 CLINICAL DATA:  Sickle cell crisis with chest pain EXAM: CHEST  1 VIEW COMPARISON:  11/21/2018 FINDINGS: No focal airspace disease or effusion. Stable cardiomediastinal silhouette. No pneumothorax. IMPRESSION: No active disease. Electronically Signed   By: Jasmine Pang M.D.   On: 07/23/2019 19:02   VAS [place] LOWER EXTREMITY VENOUS (DVT) (ONLY [facility] & [facility])  Result Date: 07/23/2019  Lower Venous DVTStudy  Indications: Pain.  Performing Technologist: Levada Schilling RDMS, RVT  Examination Guidelines: A complete evaluation includes B-mode imaging, spectral Doppler, color Doppler, and power Doppler as needed of all accessible portions of each vessel. Bilateral testing is considered an integral part of a complete examination. Limited examinations for reoccurring indications may be performed as noted. The reflux portion of the exam is performed with the patient in reverse Trendelenburg.  +---------+---------------+---------+-----------+----------+--------------+ RIGHT    CompressibilityPhasicitySpontaneityPropertiesThrombus Aging +---------+---------------+---------+-----------+----------+--------------+ CFV      Full           Yes      Yes                                 +---------+---------------+---------+-----------+----------+--------------+ SFJ      Full                                                        +---------+---------------+---------+-----------+----------+--------------+  FV Prox  Full                                                        +---------+---------------+---------+-----------+----------+--------------+ FV Mid   Full                                                        +---------+---------------+---------+-----------+----------+--------------+ FV DistalFull                                                        +---------+---------------+---------+-----------+----------+--------------+ PFV      Full                                                        +---------+---------------+---------+-----------+----------+--------------+ POP      Full           Yes      Yes                                 +---------+---------------+---------+-----------+----------+--------------+ PTV      Full                                                        +---------+---------------+---------+-----------+----------+--------------+ PERO     Full                                                         +---------+---------------+---------+-----------+----------+--------------+ GSV      Full                                                        +---------+---------------+---------+-----------+----------+--------------+   +----+---------------+---------+-----------+----------+--------------+ LEFTCompressibilityPhasicitySpontaneityPropertiesThrombus Aging +----+---------------+---------+-----------+----------+--------------+ CFV                                              Not visualized +----+---------------+---------+-----------+----------+--------------+ pt could not tolerate the evaluation of the left CFV.    Summary: RIGHT: - There is no evidence of deep vein thrombosis in the lower extremity.  - No cystic structure found in the popliteal fossa.   *See table(s) above for measurements and observations. Electronically signed by Harold Barban MD on 07/23/2019 at 9:44:55  PM.    Final     Microbiology: Recent Results (from the past 240 hour(s))  Urine culture     Status: Abnormal   Collection Time: 07/23/19  6:52 PM   Specimen: Urine, Clean Catch  Result Value Ref Range Status   Specimen Description URINE, CLEAN CATCH  Final   Special Requests NONE  Final   Culture (A)  Final    <10,000 COLONIES/mL INSIGNIFICANT GROWTH Performed at [facility], [address] [place], [place] [address]    Report Status 07/24/2019 FINAL  Final  Blood culture (routine x 2)     Status: Abnormal (Preliminary result)   Collection Time: 07/23/19 10:30 PM   Specimen: BLOOD  Result Value Ref Range Status   Specimen Description BLOOD RIGHT ANTECUBITAL  Final   Special Requests   Final    BOTTLES DRAWN AEROBIC AND ANAEROBIC Blood Culture adequate volume   Culture  Setup Time   Final    AEROBIC BOTTLE ONLY GRAM POSITIVE COCCI IN CLUSTERS CRITICAL RESULT CALLED TO, READ BACK BY AND VERIFIED WITH: E JACKSON [facility] 07/24/19 2113 JDW     Culture (A)  Final    STAPHYLOCOCCUS SPECIES (COAGULASE NEGATIVE) THE SIGNIFICANCE OF ISOLATING THIS ORGANISM FROM A SINGLE SET OF BLOOD CULTURES WHEN MULTIPLE SETS ARE DRAWN IS UNCERTAIN. PLEASE NOTIFY THE MICROBIOLOGY DEPARTMENT WITHIN ONE WEEK IF SPECIATION AND SENSITIVITIES ARE REQUIRED. Performed at [facility], [address] [place], [place] [address]    Report Status PENDING  Incomplete  Blood culture (routine x 2)     Status: None (Preliminary result)   Collection Time: 07/23/19 10:45 PM   Specimen: BLOOD LEFT HAND  Result Value Ref Range Status   Specimen Description BLOOD LEFT HAND  Final   Special Requests   Final    BOTTLES DRAWN AEROBIC AND ANAEROBIC Blood Culture adequate volume   Culture   Final    NO GROWTH 2 DAYS Performed at [facility], [address] [place], [place] [address]    Report Status PENDING  Incomplete  SARS CORONAVIRUS 2 (TAT 6-24 HRS) Nasopharyngeal Nasopharyngeal Swab     Status: None   Collection Time: 07/23/19 10:51 PM   Specimen: Nasopharyngeal Swab  Result Value Ref Range Status   SARS Coronavirus 2 NEGATIVE NEGATIVE Final    Comment: (NOTE) SARS-CoV-2 target nucleic acids are NOT DETECTED. The SARS-CoV-2 RNA is generally detectable in upper and lower respiratory specimens during the acute phase of infection. Negative results do not preclude SARS-CoV-2 infection, do not rule out co-infections with other pathogens, and should not be used as the sole basis for treatment or other patient management decisions. Negative results must be combined with clinical observations, patient history, and epidemiological information. The expected result is Negative. Fact Sheet for Patients: HairSlick.nohttps://www.fda.gov/media/138098/download Fact Sheet for Healthcare Providers: quierodirigir.comhttps://www.fda.gov/media/138095/download This test is not yet approved or cleared by the [place] FDA and  has been authorized for detection and/or diagnosis of SARS-CoV-2  by FDA under an Emergency Use Authorization (EUA). This EUA will remain  in effect (meaning this test can be used) for the duration of the COVID-19 declaration under Section 56 4(b)(1) of the Act, 21 U.S.C. section 360bbb-3(b)(1), unless the authorization is terminated or revoked sooner. Performed at [facility], [address] [place], [place] [address]      Labs: Basic Metabolic Panel: Recent Labs  Lab 07/23/19 1716  NA 139  K 4.1  CL 111  CO2  22  GLUCOSE 96  BUN 8  CREATININE 0.82  CALCIUM 9.2   Liver Function Tests: Recent Labs  Lab 07/23/19 1716  AST 19  ALT 15  ALKPHOS 34*  BILITOT 1.1  PROT 7.0  ALBUMIN 4.2   No results for input(s): LIPASE, AMYLASE in the last 168 hours. No results for input(s): AMMONIA in the last 168 hours. CBC: Recent Labs  Lab 07/23/19 1716 07/25/19 0541  WBC 11.8* 9.7  NEUTROABS 7.7  --   HGB 11.5* 10.0*  HCT 31.1* 26.7*  MCV 86.6 86.4  PLT 425* 372   Cardiac Enzymes: No results for input(s): CKTOTAL, CKMB, CKMBINDEX, TROPONINI in the last 168 hours. BNP: Invalid input(s): POCBNP CBG: No results for input(s): GLUCAP in the last 168 hours.  Time coordinating discharge: 50 minutes   Signed:   [name]  APRN, MSN, FNP-C Patient Care [facility] [address] [place], [place] [address] [phone]  [facility] 07/25/2019, 1:43 PM

## 2019-07-26 LAB — CULTURE, BLOOD (ROUTINE X 2): Special Requests: ADEQUATE

## 2019-07-27 ENCOUNTER — Other Ambulatory Visit: Payer: Self-pay | Admitting: Nurse Practitioner

## 2019-07-27 DIAGNOSIS — G894 Chronic pain syndrome: Secondary | ICD-10-CM

## 2019-07-27 MED ORDER — OXYCODONE HCL 10 MG PO TABS: 10.0000 mg | ORAL_TABLET | Freq: Four times a day (QID) | ORAL | 0 refills | Status: DC | PRN

## 2019-07-27 NOTE — Progress Notes (Signed)
Patient called the Oncall service. Spoke with Ms. Gruetzmacher Oxycodone 10mg   needs to be transferred from [place] to [facility] secondary to pharmacy being out of stock [place]).

## 2019-07-28 LAB — CULTURE, BLOOD (ROUTINE X 2)
Culture: NO GROWTH
Special Requests: ADEQUATE

## 2019-07-30 ENCOUNTER — Ambulatory Visit: Payer: Medicaid Other | Admitting: Family Medicine

## 2019-08-12 ENCOUNTER — Telehealth: Payer: Self-pay | Admitting: Family Medicine

## 2019-08-13 ENCOUNTER — Encounter: Payer: Self-pay | Admitting: Family Medicine

## 2019-08-13 ENCOUNTER — Ambulatory Visit (INDEPENDENT_AMBULATORY_CARE_PROVIDER_SITE_OTHER): Payer: Medicaid Other | Admitting: Family Medicine

## 2019-08-13 ENCOUNTER — Other Ambulatory Visit: Payer: Self-pay

## 2019-08-13 VITALS — BP 128/67 | HR 88 | Temp 98.7°F | Resp 14 | Ht 60.0 in | Wt 157.0 lb

## 2019-08-13 DIAGNOSIS — M67432 Ganglion, left wrist: Secondary | ICD-10-CM

## 2019-08-13 DIAGNOSIS — G894 Chronic pain syndrome: Secondary | ICD-10-CM

## 2019-08-13 DIAGNOSIS — G8929 Other chronic pain: Secondary | ICD-10-CM | POA: Diagnosis not present

## 2019-08-13 DIAGNOSIS — D5700 Hb-SS disease with crisis, unspecified: Secondary | ICD-10-CM

## 2019-08-13 DIAGNOSIS — M67431 Ganglion, right wrist: Secondary | ICD-10-CM | POA: Diagnosis not present

## 2019-08-13 LAB — POCT URINALYSIS DIPSTICK
Bilirubin, UA: NEGATIVE
Blood, UA: NEGATIVE
Glucose, UA: NEGATIVE
Ketones, UA: NEGATIVE
Nitrite, UA: NEGATIVE
Protein, UA: NEGATIVE
Spec Grav, UA: 1.015 (ref 1.010–1.025)
Urobilinogen, UA: 0.2 E.U./dL
pH, UA: 5.5 (ref 5.0–8.0)

## 2019-08-13 MED ORDER — OXYCODONE HCL 10 MG PO TABS: 10.0000 mg | ORAL_TABLET | Freq: Four times a day (QID) | ORAL | 0 refills | Status: DC | PRN

## 2019-08-13 NOTE — Telephone Encounter (Signed)
Done

## 2019-08-13 NOTE — Patient Instructions (Signed)
Ganglion Cyst  A ganglion cyst is a non-cancerous, fluid-filled lump that occurs near a joint or tendon. The cyst grows out of a joint or the lining of a tendon. Ganglion cysts most often develop in the hand or wrist, but they can also develop in the shoulder, elbow, hip, knee, ankle, or foot. Ganglion cysts are ball-shaped or egg-shaped. Their size can range from the size of a pea to larger than a grape. Increased activity may cause the cyst to get bigger because more fluid starts to build up. What are the causes? The exact cause of this condition is not known, but it may be related to:  Inflammation or irritation around the joint.  An injury.  Repetitive movements or overuse.  Arthritis. What increases the risk? You are more likely to develop this condition if:  You are a woman.  You are 15-40 years old. What are the signs or symptoms? The main symptom of this condition is a lump. It most often appears on the hand or wrist. In many cases, there are no other symptoms, but a cyst can sometimes cause:  Tingling.  Pain.  Numbness.  Muscle weakness.  Weak grip.  Less range of motion in a joint. How is this diagnosed? Ganglion cysts are usually diagnosed based on a physical exam. Your health care provider will feel the lump and may shine a light next to it. If it is a ganglion cyst, the light will likely shine through it. Your health care provider may order an X-ray, ultrasound, or MRI to rule out other conditions. How is this treated? Ganglion cysts often go away on their own without treatment. If you have pain or other symptoms, treatment may be needed. Treatment is also needed if the ganglion cyst limits your movement or if it gets infected. Treatment may include:  Wearing a brace or splint on your wrist or finger.  Taking anti-inflammatory medicine.  Having fluid drained from the lump with a needle (aspiration).  Getting a steroid injected into the joint.  Having  surgery to remove the ganglion cyst.  Placing a pad on your shoe or wearing shoes that will not rub against the cyst if it is on your foot. Follow these instructions at home:  Do not press on the ganglion cyst, poke it with a needle, or hit it.  Take over-the-counter and prescription medicines only as told by your health care provider.  If you have a brace or splint: ? Wear it as told by your health care provider. ? Remove it as told by your health care provider. Ask if you need to remove it when you take a shower or a bath.  Watch your ganglion cyst for any changes.  Keep all follow-up visits as told by your health care provider. This is important. Contact a health care provider if:  Your ganglion cyst becomes larger or more painful.  You have pus coming from the lump.  You have weakness or numbness in the affected area.  You have a fever or chills. Get help right away if:  You have a fever and have any of these in the cyst area: ? Increased redness. ? Red streaks. ? Swelling. Summary  A ganglion cyst is a non-cancerous, fluid-filled lump that occurs near a joint or tendon.  Ganglion cysts most often develop in the hand or wrist, but they can also develop in the shoulder, elbow, hip, knee, ankle, or foot.  Ganglion cysts often go away on their own without treatment.   This information is not intended to replace advice given to you by your health care provider. Make sure you discuss any questions you have with your health care provider. Document Revised: 04/07/2017 Document Reviewed: 12/23/2016 Elsevier Patient Education  2020 [employer]  

## 2019-08-13 NOTE — Progress Notes (Signed)
[facility]    Established Patient Office Visit  Subjective:  Patient ID: Lindsey [name], female    DOB: 03/12/1971  Age: 49 y.o. MRN: [id number]  CC:  Chief Complaint  Patient presents with  . Sickle Cell Anemia  . Cyst    cyst on left wrist   . Medication Refill    oxycodone 10 mg IR     HPI Lindsey [name] is a very pleasant 49 year old female with a medical history significant for sickle cell disease, chronic pain syndrome, opiate dependence and tolerance, anemia of chronic disease, and history of depression presents for follow-up of chronic conditions. Patient states that sickle cell has been better controlled since long-acting opiate was added to medication regimen.  Patient says that she is not having pain at present.  She last had medications this a.m. with maximum relief.  She has been hydrating consistently and taking folic acid.  She denies headache, chest pain, shortness of breath, urinary symptoms, nausea, vomiting, or diarrhea.  She has not had sick contacts, recent travel, or exposure to COVID-19. Patient is complaining of cysts to wrist, left greater than right.  Cysts have been present over the past several months.  She characterizes as nontender to touch, but have become larger over time.  Past Medical History:  Diagnosis Date  . 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 Opioid dependence in remission (HCC)   . Sickle cell crisis [facility])     Past Surgical History:  Procedure Laterality Date  . APPENDECTOMY    . CESAREAN SECTION    . OTHER SURGICAL HISTORY     c-section    Family History  Problem Relation Age of Onset  . Stroke Neg Hx        none that she knows of   . Seizures Neg Hx     Social History   Socioeconomic History  . Marital status: Married    Spouse name: Not on file  . Number of children: 2  . Years of education: Not on file  . Highest education level: Not on file  Occupational History  .  Not on file  Tobacco Use  . Smoking status: Current Some Day Smoker    Packs/day: 0.00    Types: Cigars  . Smokeless tobacco: Never Used  Substance and Sexual Activity  . Alcohol use: Yes    Comment: occasionally  . Drug use: No  . Sexual activity: Yes    Birth control/protection: None  Other Topics Concern  . Not on file  Social History Narrative   Lives at home with husband and son   Right handed   Social Determinants of Health   Financial Resource Strain:   . Difficulty of Paying Living Expenses:   Food Insecurity:   . Worried About Programme researcher, broadcasting/film/video in the Last Year:   . Barista in the Last Year:   Transportation Needs:   . Freight forwarder (Medical):   [name] Lack of Transportation (Non-Medical):   Physical Activity:   . Days of Exercise per Week:   . Minutes of Exercise per Session:   Stress:   . Feeling of Stress :   Social Connections:   . Frequency of Communication with Friends and Family:   . Frequency of Social Gatherings with Friends and Family:   . Attends Religious Services:   . Active Member of Clubs or Organizations:   . Attends Banker Meetings:   .  Marital Status:   Intimate Partner Violence:   . Fear of Current or Ex-Partner:   . Emotionally Abused:   [name] Physically Abused:   . Sexually Abused:     Outpatient Medications Prior to Visit  Medication Sig Dispense Refill  . gabapentin (NEURONTIN) 300 MG capsule Take 1 capsule by mouth three times daily (Patient taking differently: Take 300 mg by mouth 3 (three) times daily. ) 90 capsule 1  . ibuprofen (ADVIL) 800 MG tablet TAKE 1 TABLET(800 MG) BY MOUTH EVERY 8 HOURS AS NEEDED (Patient taking differently: Take 800 mg by mouth every 6 (six) hours as needed for moderate pain. ) 60 tablet 5  . oxyCODONE (OXYCONTIN) 10 mg 12 hr tablet Take 1 tablet (10 mg total) by mouth every 12 (twelve) hours. 60 tablet 0  . Oxycodone HCl 10 MG TABS Take 1 tablet (10 mg total) by mouth every 6  (six) hours as needed. 60 tablet 0   No facility-administered medications prior to visit.    No Known Allergies  ROS Review of Systems  Constitutional: Negative.   HENT: Negative.   Eyes: Negative.   Respiratory: Negative.   Cardiovascular: Negative.   Gastrointestinal: Negative.   Endocrine: Negative.   Genitourinary: Negative.   Musculoskeletal: Positive for arthralgias and back pain.  Skin: Negative.   Neurological: Negative.   Hematological: Negative.   Psychiatric/Behavioral: Negative.       Objective:    Physical Exam  Constitutional: She is oriented to person, place, and time. She appears well-developed and well-nourished.  HENT:  Head: Normocephalic.  Eyes: Pupils are equal, round, and reactive to light.  Cardiovascular: Normal rate.  Pulmonary/Chest: Effort normal and breath sounds normal.  Abdominal: Soft. Bowel sounds are normal.  Musculoskeletal:        General: Normal range of motion.     Cervical back: Normal range of motion.  Neurological: She is alert and oriented to person, place, and time.  Skin: Skin is warm and dry.  Fluid-filled cysts to the wrists bilaterally  Psychiatric: She has a normal mood and affect. Her behavior is normal. Judgment and thought content normal.    BP 128/67 (BP Location: Right Arm, Patient Position: Sitting, Cuff Size: Normal)   Pulse 88   Temp 98.7 F (37.1 C) (Oral)   Resp 14   Ht 5' (1.524 m)   Wt 157 lb (71.2 kg)   LMP 08/04/2019   SpO2 100%   BMI 30.66 kg/m  Wt Readings from Last 3 Encounters:  08/13/19 157 lb (71.2 kg)  07/10/19 152 lb (68.9 kg)  03/26/19 156 lb (70.8 kg)     Health Maintenance Due  Topic Date Due  . PAP SMEAR-Modifier  Never done    There are no preventive care reminders to display for this patient.  Lab Results  Component Value Date   TSH 2.480 02/20/2019   Lab Results  Component Value Date   WBC 9.7 07/25/2019   HGB 10.0 (L) 07/25/2019   HCT 26.7 (L) 07/25/2019   MCV  86.4 07/25/2019   PLT 372 07/25/2019   Lab Results  Component Value Date   NA 139 07/23/2019   K 4.1 07/23/2019   CO2 22 07/23/2019   GLUCOSE 96 07/23/2019   BUN 8 07/23/2019   CREATININE 0.82 07/23/2019   BILITOT 1.1 07/23/2019   ALKPHOS 34 (L) 07/23/2019   AST 19 07/23/2019   ALT 15 07/23/2019   PROT 7.0 07/23/2019   ALBUMIN 4.2 07/23/2019   CALCIUM  9.2 07/23/2019   ANIONGAP 6 07/23/2019   Lab Results  Component Value Date   CHOL  07/25/2007    119        ATP III CLASSIFICATION:  <200     mg/dL   Desirable  200-239  mg/dL   Borderline High  >=240    mg/dL   High   Lab Results  Component Value Date   HDL 33 (L) 07/25/2007   Lab Results  Component Value Date   LDLCALC  07/25/2007    78        Total Cholesterol/HDL:CHD Risk Coronary Heart Disease Risk Table                     Men   Women  1/2 Average Risk   3.4   3.3   Lab Results  Component Value Date   TRIG 38 07/25/2007   Lab Results  Component Value Date   CHOLHDL 3.6 07/25/2007   No results found for: HGBA1C    Assessment & Plan:   Problem List Items Addressed This Visit      Other   Sickle cell anemia with pain ([place]) - Primary   Relevant Orders   Urinalysis Dipstick (Completed)   Urine Culture      Sickle cell anemia with pain ([place]) Patient advised to continue folic acid to prevent vaso-occlusive pain crises.  Also, regular hydration with 64 ounces of fluid daily is recommended. Patient is up-to-date with vaccinations.  Discussed receiving Covid vaccination at length.  Patient was concerned whether it safe for someone with sickle cell disease.  Patient referred to [facility] website.  Also, explained to patient that vaccination is recommended for patients with chronic conditions. - Urinalysis Dipstick - Urine Culture  Ganglion cyst of both wrists Will monitor closely at this juncture.  Some ganglion cysts go away on their own without treatment.  Areas are not painful at this juncture.  Explained  to patient that ganglion cysts are noncancerous, fluid-filled lumps that typically occur near the joint or tendon.  Recommend wearing a brace or splint to her wrists, which can be taken over-the-counter.  Other chronic pain - [id number] 11+Oxyco+Alc+Crt-Bund  Chronic pain syndrome Reviewed PDMP substance reporting system prior to prescribing opiate medications. No inconsistencies noted.   - Oxycodone HCl 10 MG TABS; Take 1 tablet (10 mg total) by mouth every 6 (six) hours as needed for up to 15 days.  Dispense: 60 tablet; Refill: 0  Follow-up: Return in about 2 months (around 10/13/2019).    [name]  APRN, MSN, FNP-C [facility] [address] [place], [place] [address] [phone]

## 2019-08-15 LAB — URINE CULTURE

## 2019-08-17 LAB — DRUG SCREEN 764883 11+OXYCO+ALC+CRT-BUND
Amphetamines, Urine: NEGATIVE ng/mL
BENZODIAZ UR QL: NEGATIVE ng/mL
Barbiturate: NEGATIVE ng/mL
Cannabinoid Quant, Ur: NEGATIVE ng/mL
Cocaine (Metabolite): NEGATIVE ng/mL
Creatinine: 94.8 mg/dL (ref 20.0–300.0)
Ethanol: NEGATIVE %
Meperidine: NEGATIVE ng/mL
Methadone Screen, Urine: NEGATIVE ng/mL
Phencyclidine: NEGATIVE ng/mL
Propoxyphene: NEGATIVE ng/mL
Tramadol: NEGATIVE ng/mL
pH, Urine: 5.2 (ref 4.5–8.9)

## 2019-08-17 LAB — OXYCODONE/OXYMORPHONE, CONFIRM
OXYCODONE/OXYMORPH: POSITIVE — AB
OXYCODONE: 2430 ng/mL
OXYCODONE: POSITIVE — AB
OXYMORPHONE (GC/MS): >3000 ng/mL
OXYMORPHONE: POSITIVE — AB

## 2019-08-17 LAB — OPIATES CONFIRMATION, URINE: Opiates: NEGATIVE ng/mL

## 2019-08-26 ENCOUNTER — Telehealth: Payer: Self-pay | Admitting: Family Medicine

## 2019-08-26 ENCOUNTER — Other Ambulatory Visit: Payer: Self-pay | Admitting: Family Medicine

## 2019-08-26 DIAGNOSIS — G894 Chronic pain syndrome: Secondary | ICD-10-CM

## 2019-08-26 MED ORDER — OXYCODONE HCL 10 MG PO TABS: 10.0000 mg | ORAL_TABLET | Freq: Four times a day (QID) | ORAL | 0 refills | Status: DC | PRN

## 2019-08-26 NOTE — Progress Notes (Signed)
Reviewed PDMP substance reporting system prior to prescribing opiate medications. No inconsistencies noted.   Meds ordered this encounter  Medications  . Oxycodone HCl 10 MG TABS    Sig: Take 1 tablet (10 mg total) by mouth every 6 (six) hours as needed for up to 15 days.    Dispense:  60 tablet    Refill:  0    Order Specific Question:   Supervising Provider    Answer:   [name], [name]     Nikkole Placzek Moore Kashden Deboy  APRN, MSN, FNP-C [facility] [address]  [place], [place] [address] [phone]  

## 2019-08-26 NOTE — Telephone Encounter (Signed)
Pt needs refill on oxycodone 10mg  immediate release and ibuprofen 800mg .

## 2019-09-09 ENCOUNTER — Other Ambulatory Visit: Payer: Self-pay | Admitting: Family Medicine

## 2019-09-09 ENCOUNTER — Encounter (HOSPITAL_COMMUNITY): Payer: Self-pay | Admitting: *Deleted

## 2019-09-09 ENCOUNTER — Telehealth: Payer: Self-pay | Admitting: Family Medicine

## 2019-09-09 ENCOUNTER — Other Ambulatory Visit: Payer: Self-pay

## 2019-09-09 ENCOUNTER — Observation Stay (HOSPITAL_COMMUNITY)
Admission: EM | Admit: 2019-09-09 | Discharge: 2019-09-10 | Disposition: A | Discharge status: Home / Self Care | Payer: Medicaid Other | Attending: Internal Medicine | Admitting: Internal Medicine

## 2019-09-09 ENCOUNTER — Other Ambulatory Visit: Payer: Self-pay | Admitting: Nurse Practitioner

## 2019-09-09 DIAGNOSIS — Z79891 Long term (current) use of opiate analgesic: Secondary | ICD-10-CM | POA: Insufficient documentation

## 2019-09-09 DIAGNOSIS — G894 Chronic pain syndrome: Secondary | ICD-10-CM

## 2019-09-09 DIAGNOSIS — G8929 Other chronic pain: Secondary | ICD-10-CM | POA: Diagnosis not present

## 2019-09-09 DIAGNOSIS — D571 Sickle-cell disease without crisis: Secondary | ICD-10-CM

## 2019-09-09 DIAGNOSIS — D57219 Sickle-cell/Hb-C disease with crisis, unspecified: Principal | ICD-10-CM | POA: Insufficient documentation

## 2019-09-09 DIAGNOSIS — D638 Anemia in other chronic diseases classified elsewhere: Secondary | ICD-10-CM

## 2019-09-09 DIAGNOSIS — D5700 Hb-SS disease with crisis, unspecified: Secondary | ICD-10-CM

## 2019-09-09 DIAGNOSIS — M79601 Pain in right arm: Secondary | ICD-10-CM | POA: Diagnosis present

## 2019-09-09 DIAGNOSIS — Z20822 Contact with and (suspected) exposure to covid-19: Secondary | ICD-10-CM | POA: Insufficient documentation

## 2019-09-09 DIAGNOSIS — F17210 Nicotine dependence, cigarettes, uncomplicated: Secondary | ICD-10-CM | POA: Insufficient documentation

## 2019-09-09 LAB — CBC WITH DIFFERENTIAL/PLATELET
Abs Immature Granulocytes: 0.02 10*3/uL (ref 0.00–0.07)
Basophils Absolute: 0.1 10*3/uL (ref 0.0–0.1)
Basophils Relative: 1 %
Eosinophils Absolute: 0.5 10*3/uL (ref 0.0–0.5)
Eosinophils Relative: 4 %
HCT: 28.8 % — ABNORMAL LOW (ref 36.0–46.0)
Hemoglobin: 10.7 g/dL — ABNORMAL LOW (ref 12.0–15.0)
Immature Granulocytes: 0 %
Lymphocytes Relative: 43 %
Lymphs Abs: 4.8 10*3/uL — ABNORMAL HIGH (ref 0.7–4.0)
MCH: 32.5 pg (ref 26.0–34.0)
MCHC: 37.2 g/dL — ABNORMAL HIGH (ref 30.0–36.0)
MCV: 87.5 fL (ref 80.0–100.0)
Monocytes Absolute: 0.7 10*3/uL (ref 0.1–1.0)
Monocytes Relative: 6 %
Neutro Abs: 5.1 10*3/uL (ref 1.7–7.7)
Neutrophils Relative %: 46 %
Platelets: 409 10*3/uL — ABNORMAL HIGH (ref 150–400)
RBC: 3.29 MIL/uL — ABNORMAL LOW (ref 3.87–5.11)
RDW: 13.9 % (ref 11.5–15.5)
WBC: 11 10*3/uL — ABNORMAL HIGH (ref 4.0–10.5)
nRBC: 0.2 % (ref 0.0–0.2)

## 2019-09-09 LAB — COMPREHENSIVE METABOLIC PANEL
ALT: 17 U/L (ref 0–44)
AST: 30 U/L (ref 15–41)
Albumin: 4.2 g/dL (ref 3.5–5.0)
Alkaline Phosphatase: 35 U/L — ABNORMAL LOW (ref 38–126)
Anion gap: 9 (ref 5–15)
BUN: 9 mg/dL (ref 6–20)
CO2: 21 mmol/L — ABNORMAL LOW (ref 22–32)
Calcium: 8.7 mg/dL — ABNORMAL LOW (ref 8.9–10.3)
Chloride: 112 mmol/L — ABNORMAL HIGH (ref 98–111)
Creatinine, Ser: 0.86 mg/dL (ref 0.44–1.00)
GFR calc Af Amer: >60 mL/min (ref >60)
GFR calc non Af Amer: >60 mL/min (ref >60)
Glucose, Bld: 80 mg/dL (ref 70–99)
Potassium: 4.3 mmol/L (ref 3.5–5.1)
Sodium: 142 mmol/L (ref 135–145)
Total Bilirubin: 1.4 mg/dL — ABNORMAL HIGH (ref 0.3–1.2)
Total Protein: 6.9 g/dL (ref 6.5–8.1)

## 2019-09-09 LAB — RETICULOCYTES
Immature Retic Fract: 25 % — ABNORMAL HIGH (ref 2.3–15.9)
RBC.: 3.26 MIL/uL — ABNORMAL LOW (ref 3.87–5.11)
Retic Count, Absolute: 119 10*3/uL (ref 19.0–186.0)
Retic Ct Pct: 3.7 % — ABNORMAL HIGH (ref 0.4–3.1)

## 2019-09-09 LAB — I-STAT BETA HCG BLOOD, ED (MC, WL, AP ONLY): I-stat hCG, quantitative: <5 m[IU]/mL (ref <5)

## 2019-09-09 MED ORDER — KETOROLAC TROMETHAMINE 15 MG/ML IJ SOLN
15.0000 mg | INTRAMUSCULAR | Status: AC
  Administered 2019-09-09: 15 mg via INTRAVENOUS
  Filled 2019-09-09: qty 1

## 2019-09-09 MED ORDER — MORPHINE SULFATE 30 MG PO TABS
30.0000 mg | ORAL_TABLET | Freq: Once | ORAL | Status: AC
  Administered 2019-09-09: 30 mg via ORAL
  Filled 2019-09-09: qty 1

## 2019-09-09 MED ORDER — HYDROMORPHONE HCL 2 MG/ML IJ SOLN
2.0000 mg | INTRAMUSCULAR | Status: AC
  Administered 2019-09-09: 2 mg via INTRAVENOUS
  Filled 2019-09-09 (×2): qty 1

## 2019-09-09 MED ORDER — ONDANSETRON HCL 4 MG/2ML IJ SOLN
4.0000 mg | INTRAMUSCULAR | Status: DC | PRN
  Administered 2019-09-09: 4 mg via INTRAVENOUS
  Filled 2019-09-09: qty 2

## 2019-09-09 MED ORDER — HYDROMORPHONE HCL 2 MG/ML IJ SOLN
2.0000 mg | INTRAMUSCULAR | Status: AC
  Administered 2019-09-09: 2 mg via INTRAVENOUS

## 2019-09-09 MED ORDER — OXYCODONE HCL 10 MG PO TABS: 10.0000 mg | ORAL_TABLET | Freq: Four times a day (QID) | ORAL | 0 refills | Status: DC | PRN

## 2019-09-09 MED ORDER — SODIUM CHLORIDE 0.45 % IV SOLN: INTRAVENOUS | Status: DC

## 2019-09-09 MED ORDER — DIPHENHYDRAMINE HCL 50 MG/ML IJ SOLN
25.0000 mg | Freq: Once | INTRAMUSCULAR | Status: AC
  Administered 2019-09-09: 25 mg via INTRAVENOUS
  Filled 2019-09-09: qty 1

## 2019-09-09 NOTE — ED Triage Notes (Signed)
Pt arrives c/o right arm and right leg pain, says its her sickle cell pain. 2-3 days of pain, out of her pain meds.

## 2019-09-09 NOTE — Progress Notes (Signed)
Reviewed PDMP substance reporting system prior to prescribing opiate medications. No inconsistencies noted.   Meds ordered this encounter  Medications  . Oxycodone HCl 10 MG TABS    Sig: Take 1 tablet (10 mg total) by mouth every 6 (six) hours as needed for up to 15 days.    Dispense:  60 tablet    Refill:  0    Order Specific Question:   Supervising Provider    Answer:   [name], [name]     Tommey Barret Moore Katori Wirsing  APRN, MSN, FNP-C [facility] [address]  [place], [place] [address] [phone]  

## 2019-09-09 NOTE — ED Provider Notes (Signed)
[facility]-EMERGENCY DEPT Provider Note   CSN: [id number] Arrival date & time: 09/09/19  1928     History Chief Complaint  Patient presents with  . Sickle Cell Pain Crisis    Lindsey [name] is a 49 y.o. female who presents emergency department with chief complaint of pain crisis.  She has a history of sickle cell anemia.  She complains of 3 days of worsening severe pain dominantly in her right arm and right leg.  This is typical of her sickle cell pain crisis.  She states that her pain is severe and worsening.  She has been taking her pain medications at home without relief but has been out of her pain medicines as well.  She denies fevers, chills, shortness of breath.  She denies headaches, changes in vision, or chest pain.  She has not had any cough.  HPI     Past Medical History:  Diagnosis Date  . 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 Opioid dependence in remission (HCC)   . Sickle cell crisis [facility])     Patient Active Problem List   Diagnosis Date Noted  . Current mild episode of major depressive disorder (HCC)   . Sickle cell crisis (HCC) 02/28/2019  . Opioid dependence in remission (HCC) 10/01/2017  . Sickle cell disease (HCC) 10/01/2017  . Chronic pain 10/01/2017  . 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 Leukocytosis 09/03/2016  . Sickle cell pain crisis (HCC) 07/20/2012  . 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 APPENDECTOMY    . CESAREAN SECTION    . OTHER SURGICAL HISTORY     c-section     OB History   No obstetric history on file.     Family History  Problem Relation Age of Onset  . Stroke Neg Hx        none that she knows of   . Seizures Neg Hx     Social History   Tobacco Use  . Smoking status: Current Some Day Smoker    Packs/day: 0.00    Types: Cigars  . Smokeless tobacco: Never Used  Substance Use Topics  . Alcohol use: Yes    Comment:  occasionally  . Drug use: No    Home Medications Prior to Admission medications   Medication Sig Start Date End Date Taking? Authorizing Provider  gabapentin (NEURONTIN) 300 MG capsule Take 1 capsule by mouth three times daily Patient taking differently: Take 300 mg by mouth 3 (three) times daily.  05/30/19  Yes Massie Maroon, FNP  ibuprofen (ADVIL) 800 MG tablet TAKE 1 TABLET(800 MG) BY MOUTH EVERY 8 HOURS AS NEEDED Patient taking differently: Take 800 mg by mouth every 6 (six) hours as needed for moderate pain.  12/03/18  Yes Mike Gip, FNP  Oxycodone HCl 10 MG TABS Take 1 tablet (10 mg total) by mouth every 6 (six) hours as needed for up to 15 days. 09/11/19 09/26/19 Yes Massie Maroon, FNP  oxyCODONE (OXYCONTIN) 10 mg 12 hr tablet Take 1 tablet (10 mg total) by mouth every 12 (twelve) hours. Patient not taking: Reported on 09/09/2019 07/25/19   Massie Maroon, FNP    Allergies    Patient has no known allergies.  Review of Systems   Review of Systems Ten systems reviewed and are negative for acute change, except as noted in the HPI.   Physical Exam Updated Vital Signs BP 110/76  Pulse (!) 58   Temp 98.8 F (37.1 C) (Oral)   Resp 17   Ht 5' (1.524 m)   Wt 70.3 kg   LMP 09/02/2019   SpO2 99%   BMI 30.27 kg/m   Physical Exam Vitals and nursing note reviewed.  Constitutional:      General: She is not in acute distress.    Appearance: She is well-developed. She is not diaphoretic.     Comments: Patient appears to be in significant pain.  Tearful, tremulous  HENT:     Head: Normocephalic and atraumatic.  Eyes:     General: No scleral icterus.    Conjunctiva/sclera: Conjunctivae normal.  Cardiovascular:     Rate and Rhythm: Normal rate and regular rhythm.     Heart sounds: Normal heart sounds. No murmur. No friction rub. No gallop.   Pulmonary:     Effort: Pulmonary effort is normal. No respiratory distress.     Breath sounds: Normal breath sounds.    Abdominal:     General: Bowel sounds are normal. There is no distension.     Palpations: Abdomen is soft. There is no mass.     Tenderness: There is no abdominal tenderness. There is no guarding.  Musculoskeletal:     Cervical back: Normal range of motion.  Skin:    General: Skin is warm and dry.  Neurological:     Mental Status: She is alert and oriented to person, place, and time.  Psychiatric:        Behavior: Behavior normal.     ED Results / Procedures / Treatments   Labs (all labs ordered are listed, but only abnormal results are displayed) Labs Reviewed  COMPREHENSIVE METABOLIC PANEL - Abnormal; Notable for the following components:      Result Value   Chloride 112 (*)    CO2 21 (*)    Calcium 8.7 (*)    Alkaline Phosphatase 35 (*)    Total Bilirubin 1.4 (*)    All other components within normal limits  RETICULOCYTES - Abnormal; Notable for the following components:   Retic Ct Pct 3.7 (*)    RBC. 3.26 (*)    Immature Retic Fract 25.0 (*)    All other components within normal limits  CBC WITH DIFFERENTIAL/PLATELET - Abnormal; Notable for the following components:   WBC 11.0 (*)    RBC 3.29 (*)    Hemoglobin 10.7 (*)    HCT 28.8 (*)    MCHC 37.2 (*)    Platelets 409 (*)    Lymphs Abs 4.8 (*)    All other components within normal limits  URINALYSIS, ROUTINE W REFLEX MICROSCOPIC  I-STAT BETA HCG BLOOD, ED ([facility], [facility], [facility] ONLY)  TYPE AND SCREEN  ABO/RH    EKG None  Radiology No results found.  Procedures Procedures (including critical care time)  Medications Ordered in ED Medications  0.45 % sodium chloride infusion ( Intravenous New Bag/Given 09/09/19 2155)  ondansetron (ZOFRAN) injection 4 mg (4 mg Intravenous Given 09/09/19 2152)  ketorolac (TORADOL) 15 MG/ML injection 15 mg (15 mg Intravenous Given 09/09/19 2149)  HYDROmorphone (DILAUDID) injection 2 mg (2 mg Intravenous Given 09/09/19 2227)  HYDROmorphone (DILAUDID) injection 2 mg (2 mg Intravenous Given  09/09/19 2156)  morphine (MSIR) tablet 30 mg (30 mg Oral Given 09/09/19 2325)  diphenhydrAMINE (BENADRYL) injection 25 mg (25 mg Intravenous Given 09/09/19 2153)    ED Course  I have reviewed the triage vital signs and the nursing notes.  Pertinent  labs & imaging results that were available during my care of the patient were reviewed by me and considered in my medical decision making (see chart for details).    MDM Rules/Calculators/A&P                      Is a 49 year old female with a past medical history of sickle cell anemia who presents with sickle cell pain crisis.  Personally ordered interpreted and reviewed the patient's labs which shows slightly elevated retake count.  White blood cell count is 11,000 likely acute phase reaction.  Hemoglobin of 10.7 which is about the patient's baseline.  CMP shows mildly elevated bilirubin.  Patient's i-STAT hCG is negative.  Covid test pending.  Patient given our standard pain control protocol here but is still having fairly significant pain here in the emergency department.  She may have acute pain crisis versus opiate withdrawal being out of her medications however she is still extremely uncomfortable and will consult for admission.  Lindsey Braun Fruge is a 49 y.o. female who presents to ED for pain c/w typical sickle cell crisis. No shortness of breath, fevers or signs of acute chest. Patientt otherwise in no acute distress objectively other than complaint of pain. Given IV fluids and pain medication.   Patient re-evaluated and still in pain after three rounds of IV dilaudid. Dr. Allena Katz consulted who will admit.   Final Clinical Impression(s) / ED Diagnoses Final diagnoses:  Sickle cell pain crisis [facility])    Rx / DC Orders ED Discharge Orders    None       Arthor Captain, PA-C 09/10/19 0011    Raeford Razor, MD 09/10/19 2018

## 2019-09-09 NOTE — Progress Notes (Signed)
Virtual Visit via Telephone Note  I connected with Lindsey [name] on 09/09/19 at  by telephone and verified that I am speaking with the correct person using two identifiers.   I discussed the limitations, risks, security and privacy concerns of performing an evaluation and management service by telephone and the availability of in person appointments. I also discussed with the patient that there may be a patient responsible charge related to this service. The patient expressed understanding and agreed to proceed.   History of Present Illness:  Spoke with Lindsey [name] over the phone. She called the oncall service. She was crying in pain. "I am having a pain crisis".  Observations/Objective: Tearful but able to speak in full sentences  Assessment and Plan: Sickle Cell Pain Crisis: encouraged to go to the ER at [facility] to be treated for her pain crisis. She agreed with plan of care.   Follow Up Instructions: Follow up as scheduled.   I discussed the assessment and treatment plan with the patient. The patient was provided an opportunity to ask questions and all were answered. The patient agreed with the plan and demonstrated an understanding of the instructions.   The patient was advised to call back or seek an in-person evaluation if the symptoms worsen or if the condition fails to improve as anticipated.   Barbette Merino, NP

## 2019-09-10 ENCOUNTER — Ambulatory Visit: Payer: Medicaid Other | Admitting: Family Medicine

## 2019-09-10 ENCOUNTER — Telehealth: Payer: Self-pay | Admitting: Family Medicine

## 2019-09-10 DIAGNOSIS — D638 Anemia in other chronic diseases classified elsewhere: Secondary | ICD-10-CM | POA: Diagnosis not present

## 2019-09-10 DIAGNOSIS — D571 Sickle-cell disease without crisis: Secondary | ICD-10-CM

## 2019-09-10 DIAGNOSIS — D5700 Hb-SS disease with crisis, unspecified: Secondary | ICD-10-CM | POA: Diagnosis present

## 2019-09-10 LAB — CBC
HCT: 26.3 % — ABNORMAL LOW (ref 36.0–46.0)
Hemoglobin: 10 g/dL — ABNORMAL LOW (ref 12.0–15.0)
MCH: 32.6 pg (ref 26.0–34.0)
MCHC: 38 g/dL — ABNORMAL HIGH (ref 30.0–36.0)
MCV: 85.7 fL (ref 80.0–100.0)
Platelets: 336 10*3/uL (ref 150–400)
RBC: 3.07 MIL/uL — ABNORMAL LOW (ref 3.87–5.11)
RDW: 13.5 % (ref 11.5–15.5)
WBC: 12.9 10*3/uL — ABNORMAL HIGH (ref 4.0–10.5)
nRBC: 0.2 % (ref 0.0–0.2)

## 2019-09-10 LAB — BASIC METABOLIC PANEL
Anion gap: 5 (ref 5–15)
BUN: 11 mg/dL (ref 6–20)
CO2: 25 mmol/L (ref 22–32)
Calcium: 8.4 mg/dL — ABNORMAL LOW (ref 8.9–10.3)
Chloride: 112 mmol/L — ABNORMAL HIGH (ref 98–111)
Creatinine, Ser: 0.98 mg/dL (ref 0.44–1.00)
GFR calc Af Amer: >60 mL/min (ref >60)
GFR calc non Af Amer: >60 mL/min (ref >60)
Glucose, Bld: 121 mg/dL — ABNORMAL HIGH (ref 70–99)
Potassium: 3.6 mmol/L (ref 3.5–5.1)
Sodium: 142 mmol/L (ref 135–145)

## 2019-09-10 LAB — RESPIRATORY PANEL BY RT PCR (FLU A&B, COVID)
Influenza A by PCR: NEGATIVE
Influenza B by PCR: NEGATIVE
SARS Coronavirus 2 by RT PCR: NEGATIVE

## 2019-09-10 LAB — TYPE AND SCREEN
ABO/RH(D): A POS
Antibody Screen: NEGATIVE

## 2019-09-10 LAB — ABO/RH: ABO/RH(D): A POS

## 2019-09-10 MED ORDER — DEXTROSE-NACL 5-0.45 % IV SOLN: INTRAVENOUS | Status: DC

## 2019-09-10 MED ORDER — ONDANSETRON HCL 4 MG PO TABS: 4.0000 mg | ORAL_TABLET | ORAL | Status: DC | PRN

## 2019-09-10 MED ORDER — DIPHENHYDRAMINE HCL 25 MG PO CAPS: 25.0000 mg | ORAL_CAPSULE | ORAL | Status: DC | PRN

## 2019-09-10 MED ORDER — KETOROLAC TROMETHAMINE 15 MG/ML IJ SOLN
15.0000 mg | Freq: Four times a day (QID) | INTRAMUSCULAR | Status: DC
  Administered 2019-09-10 (×3): 15 mg via INTRAVENOUS
  Filled 2019-09-10 (×3): qty 1

## 2019-09-10 MED ORDER — HYDROMORPHONE 1 MG/ML IV SOLN
INTRAVENOUS | Status: DC
  Administered 2019-09-10: 30 mg via INTRAVENOUS
  Administered 2019-09-10 (×3): 1.4 mg via INTRAVENOUS
  Administered 2019-09-10: 3.9 mg via INTRAVENOUS
  Filled 2019-09-10: qty 30

## 2019-09-10 MED ORDER — GABAPENTIN 300 MG PO CAPS
300.0000 mg | ORAL_CAPSULE | Freq: Three times a day (TID) | ORAL | Status: DC
  Administered 2019-09-10 (×2): 300 mg via ORAL
  Filled 2019-09-10 (×2): qty 1

## 2019-09-10 MED ORDER — FAMOTIDINE IN NACL 20-0.9 MG/50ML-% IV SOLN: 20.0000 mg | Freq: Two times a day (BID) | INTRAVENOUS | Status: DC

## 2019-09-10 MED ORDER — OXYCODONE HCL 5 MG PO TABS: 10.0000 mg | ORAL_TABLET | Freq: Four times a day (QID) | ORAL | Status: DC | PRN

## 2019-09-10 MED ORDER — FAMOTIDINE 20 MG PO TABS
20.0000 mg | ORAL_TABLET | Freq: Two times a day (BID) | ORAL | Status: DC
  Administered 2019-09-10 (×2): 20 mg via ORAL
  Filled 2019-09-10 (×2): qty 1

## 2019-09-10 MED ORDER — SODIUM CHLORIDE 0.9% FLUSH: 9.0000 mL | INTRAVENOUS | Status: DC | PRN

## 2019-09-10 MED ORDER — SENNOSIDES-DOCUSATE SODIUM 8.6-50 MG PO TABS
1.0000 | ORAL_TABLET | Freq: Two times a day (BID) | ORAL | Status: DC
  Administered 2019-09-10 (×2): 1 via ORAL
  Filled 2019-09-10 (×2): qty 1

## 2019-09-10 MED ORDER — ENOXAPARIN SODIUM 40 MG/0.4ML ~~LOC~~ SOLN
40.0000 mg | Freq: Every day | SUBCUTANEOUS | Status: DC
  Administered 2019-09-10: 40 mg via SUBCUTANEOUS
  Filled 2019-09-10: qty 0.4

## 2019-09-10 MED ORDER — SODIUM CHLORIDE 0.9 % IV SOLN
25.0000 mg | INTRAVENOUS | Status: DC | PRN
  Filled 2019-09-10: qty 0.5

## 2019-09-10 MED ORDER — NALOXONE HCL 0.4 MG/ML IJ SOLN: 0.4000 mg | INTRAMUSCULAR | Status: DC | PRN

## 2019-09-10 MED ORDER — OXYCODONE HCL 10 MG PO TABS: 10.0000 mg | ORAL_TABLET | Freq: Four times a day (QID) | ORAL | Status: DC | PRN

## 2019-09-10 MED ORDER — OXYCODONE HCL ER 10 MG PO T12A: 10.0000 mg | EXTENDED_RELEASE_TABLET | Freq: Two times a day (BID) | ORAL | 0 refills | Status: DC

## 2019-09-10 MED ORDER — POLYETHYLENE GLYCOL 3350 17 G PO PACK: 17.0000 g | PACK | Freq: Every day | ORAL | Status: DC | PRN

## 2019-09-10 MED ORDER — ONDANSETRON HCL 4 MG/2ML IJ SOLN: 4.0000 mg | INTRAMUSCULAR | Status: DC | PRN

## 2019-09-10 MED ORDER — OXYCODONE HCL ER 10 MG PO T12A
10.0000 mg | EXTENDED_RELEASE_TABLET | Freq: Two times a day (BID) | ORAL | Status: DC
  Administered 2019-09-10 (×2): 10 mg via ORAL
  Filled 2019-09-10 (×2): qty 1

## 2019-09-10 NOTE — Discharge Summary (Signed)
Physician Discharge Summary  Lindsey [name] [id number]:[id number] DOB: January 26, 1971 DOA: 09/09/2019  PCP: Dorena Dew, FNP  Admit date: 09/09/2019  Discharge date: 09/10/2019  Discharge Diagnoses:  Principal Problem:   Sickle cell anemia with crisis [facility]) Active Problems:   Chronic pain   Anemia of chronic disease   Discharge Condition: Stable  Disposition:  Pt is discharged home in good condition and is to follow up with Dorena Dew, FNP this week to have labs evaluated. Lindsey [name] is instructed to increase activity slowly and balance with rest for the next few days, and use prescribed medication to complete treatment of pain  Diet: Regular Wt Readings from Last 3 Encounters:  09/09/19 70.3 kg  08/13/19 71.2 kg  07/10/19 68.9 kg    History of present illness:  Lindsey [name] is a 49 year old female with a medical history significant for sickle cell disease, chronic pain syndrome, opiate dependence, and opiate tolerance, and history of anemia of chronic disease presented to the emergency department for evaluation of sickle cell pain.  She reports that recurrent sickle cell type pain in her right arm and right leg beginning about 2 to 3 days ago.  She said it is similar to her prior sickle cell pain crisis episodes.  She has been taking long-acting oxycodone 10 mg every 12 hours and oxycodone 10 mg every 6 hours as needed but says she ran out of her medications a couple of days ago.  She has been trying to manage pain over the past several days with ibuprofen alone without significant relief.  She reports mild nonproductive cough.  She also reports constipation with last bowel movement yesterday.  She came to the ER for further evaluation and management of uncontrolled pain.  She denies any chest pain, subjective fevers, chills, dyspnea, nausea, vomiting, abdominal pain, dysuria, or diarrhea.  She denies any obvious bleeding.  ER course: Initial vitals showed BP 145/72, pulse 74, RR 20,  temperature 98.8 F, oxygen saturation 100% on RA. Labs show WBC 11.0, hemoglobin 10.7, platelets 409,000, reticulocyte count 3.7%, sodium 142, potassium 4.3, bicarb 21, BUN 9, creatinine 0.86, serum glucose 80, and a pregnancy test negative.  COVID-19 test negative.  Patient was given IV Toradol, IV Dilaudid, oral morphine 30 mg once without significant relief of pain.  Hospitalist service was consulted to admit for further evaluation and management.  Hospital Course:  Sickle cell disease with pain crisis: Patient was admitted in observation for further evaluation of pain crisis.  Patient is requesting discharge.  She states that pain has improved significantly and pain intensity is 1/10. Patient was admitted for sickle cell pain crisis and managed appropriately with IVF, IV Dilaudid via PCA and IV Toradol, as well as other adjunct therapies per sickle cell pain management protocols. Patient is afebrile.  Oxygen saturation is 100% on RA.  Patient is out of home medications oxycodone 10 mg every 6 hours was sent to patient's pharmacy on yesterday.  OxyContin 10 mg every 12 hours #60 was sent in on today.  Patient advised to pick up medications.  She is aware of upcoming appointments.  She is to schedule primary care appointment in 1 week.  Patient was discharged home today in a hemodynamically stable condition.   Discharge Exam: Vitals:   09/10/19 1502 09/10/19 1516  BP: 98/60   Pulse: 61   Resp: 15 16  Temp: 98 F (36.7 C)   SpO2: 100% 100%   Vitals:   09/10/19 0943 09/10/19 1118 09/10/19 1502  09/10/19 1516  BP: 114/67  98/60   Pulse: (!) 57  61   Resp: 12 16 15 16   Temp: 98.2 F (36.8 C)  98 F (36.7 C)   TempSrc: Oral  Oral   SpO2: 97% 98% 100% 100%  Weight:      Height:        General appearance : Awake, alert, not in any distress. Speech Clear. Not toxic looking HEENT: Atraumatic and Normocephalic, pupils equally reactive to light and accomodation Neck: Supple, no JVD. No  cervical lymphadenopathy.  Chest: Good air entry bilaterally, no added sounds  CVS: S1 S2 regular, no murmurs.  Abdomen: Bowel sounds present, Non tender and not distended with no gaurding, rigidity or rebound. Extremities: B/L Lower Ext shows no edema, both legs are warm to touch Neurology: Awake alert, and oriented X 3, CN II-XII intact, Non focal Skin: No Rash  Discharge Instructions  Discharge Instructions    Discharge patient   Complete by: As directed    Discharge disposition: 01-Home or Self Care   Discharge patient date: 09/10/2019     Allergies as of 09/10/2019   No Known Allergies     Medication List    TAKE these medications   gabapentin 300 MG capsule Commonly known as: NEURONTIN Take 1 capsule by mouth three times daily What changed:   how much to take  how to take this  when to take this  additional instructions   ibuprofen 800 MG tablet Commonly known as: ADVIL TAKE 1 TABLET(800 MG) BY MOUTH EVERY 8 HOURS AS NEEDED What changed: See the new instructions.   oxyCODONE 10 mg 12 hr tablet Commonly known as: OXYCONTIN Take 1 tablet (10 mg total) by mouth every 12 (twelve) hours.   Oxycodone HCl 10 MG Tabs Take 1 tablet (10 mg total) by mouth every 6 (six) hours as needed for up to 15 days. Start taking on: Sep 11, 2019       The results of significant diagnostics from this hospitalization (including imaging, microbiology, ancillary and laboratory) are listed below for reference.    Significant Diagnostic Studies: No results found.  Microbiology: Recent Results (from the past 240 hour(s))  Respiratory Panel by RT PCR (Flu A&B, Covid) - Nasopharyngeal Swab     Status: None   Collection Time: 09/09/19 11:54 PM   Specimen: Nasopharyngeal Swab  Result Value Ref Range Status   SARS Coronavirus 2 by RT PCR NEGATIVE NEGATIVE Final    Comment: (NOTE) SARS-CoV-2 target nucleic acids are NOT DETECTED. The SARS-CoV-2 RNA is generally detectable in upper  respiratoy specimens during the acute phase of infection. The lowest concentration of SARS-CoV-2 viral copies this assay can detect is 131 copies/mL. A negative result does not preclude SARS-Cov-2 infection and should not be used as the sole basis for treatment or other patient management decisions. A negative result may occur with  improper specimen collection/handling, submission of specimen other than nasopharyngeal swab, presence of viral mutation(s) within the areas targeted by this assay, and inadequate number of viral copies (<131 copies/mL). A negative result must be combined with clinical observations, patient history, and epidemiological information. The expected result is Negative. Fact Sheet for Patients:  11/09/19 Fact Sheet for Healthcare Providers:  https://www.moore.com/ This test is not yet ap proved or cleared by the https://www.young.biz/ FDA and  has been authorized for detection and/or diagnosis of SARS-CoV-2 by FDA under an Emergency Use Authorization (EUA). This EUA will remain  in effect (meaning this test  can be used) for the duration of the COVID-19 declaration under Section 564(b)(1) of the Act, 21 U.S.C. section 360bbb-3(b)(1), unless the authorization is terminated or revoked sooner.    Influenza A by PCR NEGATIVE NEGATIVE Final   Influenza B by PCR NEGATIVE NEGATIVE Final    Comment: (NOTE) The Xpert Xpress SARS-CoV-2/FLU/RSV assay is intended as an aid in  the diagnosis of influenza from Nasopharyngeal swab specimens and  should not be used as a sole basis for treatment. Nasal washings and  aspirates are unacceptable for Xpert Xpress SARS-CoV-2/FLU/RSV  testing. Fact Sheet for Patients: https://www.moore.com/ Fact Sheet for Healthcare Providers: https://www.young.biz/ This test is not yet approved or cleared by the [place] FDA and  has been authorized for  detection and/or diagnosis of SARS-CoV-2 by  FDA under an Emergency Use Authorization (EUA). This EUA will remain  in effect (meaning this test can be used) for the duration of the  Covid-19 declaration under Section 564(b)(1) of the Act, 21  U.S.C. section 360bbb-3(b)(1), unless the authorization is  terminated or revoked. Performed at [facility], [address], [place], [place] [address]      Labs: Basic Metabolic Panel: Recent Labs  Lab 09/09/19 2142 09/10/19 0534  NA 142 142  K 4.3 3.6  CL 112* 112*  CO2 21* 25  GLUCOSE 80 121*  BUN 9 11  CREATININE 0.86 0.98  CALCIUM 8.7* 8.4*   Liver Function Tests: Recent Labs  Lab 09/09/19 2142  AST 30  ALT 17  ALKPHOS 35*  BILITOT 1.4*  PROT 6.9  ALBUMIN 4.2   No results for input(s): LIPASE, AMYLASE in the last 168 hours. No results for input(s): AMMONIA in the last 168 hours. CBC: Recent Labs  Lab 09/09/19 2142 09/10/19 0534  WBC 11.0* 12.9*  NEUTROABS 5.1  --   HGB 10.7* 10.0*  HCT 28.8* 26.3*  MCV 87.5 85.7  PLT 409* 336   Cardiac Enzymes: No results for input(s): CKTOTAL, CKMB, CKMBINDEX, TROPONINI in the last 168 hours. BNP: Invalid input(s): POCBNP CBG: No results for input(s): GLUCAP in the last 168 hours.  Time coordinating discharge: 30 minutes  Signed:  [name]  APRN, MSN, FNP-C Patient Care [facility] [address] [place], [place] [address] [phone]  [facility] 09/10/2019, 5:16 PM

## 2019-09-10 NOTE — H&P (Signed)
History and Physical    Puneet Selden DXA:[id number] DOB: 08-04-1970 DOA: 09/09/2019  PCP: Dorena Dew, FNP  Patient coming from: Home  I have personally briefly reviewed patient's old medical records in [place]  Chief Complaint: Sickle cell pain  HPI: Lindsey [name] is a 49 y.o. female with medical history significant for sickle cell disease, anemia of chronic disease, chronic pain syndrome who presents to the ED for evaluation of sickle cell pain.  Patient reports recurrent sickle cell type pain in her right arm and right leg beginning about 2 or 3 days ago.  She says it similar to her prior sickle cell pain crisis episodes.  She has been taking long-acting oxycodone 10 mg q12h and oxycodone 10 mg q6h as needed but says she ran out of her medications a couple days ago.  She has been trying to manage her pain the last 2 days with ibuprofen alone without significant relief.  She reports mild nonproductive cough.  She also reports constipation with last bowel movement yesterday.  She came to the ED for further evaluation and management of uncontrolled pain.  She denies any chest pain, subjective fevers, chills, dyspnea, nausea, vomiting, abdominal pain, dysuria, or diarrhea.  She denies any obvious bleeding.  ED Course:  Initial vitals showed BP 145/72, pulse 74, RR 20, temp 98.8 Fahrenheit, SPO2 100% on room air.  Labs show WBC 11.0, hemoglobin 10.7, platelets 409,000, reticulocyte count 3.7%, sodium 142, potassium 4.3, bicarb 21, BUN 9, creatinine 0.86, serum glucose 80, i-STAT beta-hCG undetectable.  SARS-CoV-2 PCR panel is collected and pending.  Patient was given IV Toradol 15 mg x 1, IV Dilaudid 2 mg x 2, and oral morphine 30 mg once without significant relief of pain.  The hospitalist service was consulted to admit for further evaluation and management.  Review of Systems: All systems reviewed and are negative except as documented in history of present illness  above.   Past Medical History:  Diagnosis Date  . Opiate abuse, episodic ([place]) 09/25/2017  . Opioid dependence in remission ([place])   . Sickle cell crisis [facility])     Past Surgical History:  Procedure Laterality Date  . APPENDECTOMY    . CESAREAN SECTION    . OTHER SURGICAL HISTORY     c-section    Social History:  reports that she has been smoking cigars. She has been smoking about 0.00 packs per day. She has never used smokeless tobacco. She reports current alcohol use. She reports that she does not use drugs.  No Known Allergies  Family History  Problem Relation Age of Onset  . Stroke Neg Hx        none that she knows of   . Seizures Neg Hx      Prior to Admission medications   Medication Sig Start Date End Date Taking? Authorizing Provider  gabapentin (NEURONTIN) 300 MG capsule Take 1 capsule by mouth three times daily Patient taking differently: Take 300 mg by mouth 3 (three) times daily.  05/30/19  Yes Dorena Dew, FNP  ibuprofen (ADVIL) 800 MG tablet TAKE 1 TABLET(800 MG) BY MOUTH EVERY 8 HOURS AS NEEDED Patient taking differently: Take 800 mg by mouth every 6 (six) hours as needed for moderate pain.  12/03/18  Yes Lanae Boast, FNP  Oxycodone HCl 10 MG TABS Take 1 tablet (10 mg total) by mouth every 6 (six) hours as needed for up to 15 days. 09/11/19 09/26/19 Yes Dorena Dew, FNP  oxyCODONE Alesia Morin)  10 mg 12 hr tablet Take 1 tablet (10 mg total) by mouth every 12 (twelve) hours. Patient not taking: Reported on 09/09/2019 07/25/19   Massie Maroon, FNP    Physical Exam: Vitals:   09/09/19 2202 09/09/19 2202 09/09/19 2219 09/09/19 2300  BP:  (!) 121/103  110/76  Pulse: 63 63 (!) 55 (!) 58  Resp:  17  17  Temp:      TempSrc:      SpO2: 90% 91% 99% 99%  Weight:      Height:       Constitutional: Resting in bed in the left lateral decubitus position, NAD, calm, appears tired and somewhat uncomfortable Eyes: PERRL, lids and conjunctivae normal ENMT:  Mucous membranes are dry. Posterior pharynx clear of any exudate or lesions.Normal dentition.  Neck: normal, supple, no masses. Respiratory: clear to auscultation bilaterally, no wheezing, no crackles. Normal respiratory effort. No accessory muscle use.  Cardiovascular: Regular rate and rhythm, no murmurs / rubs / gallops. No extremity edema. 2+ pedal pulses. Abdomen: no tenderness, no masses palpated. No hepatosplenomegaly. Bowel sounds positive.  Musculoskeletal: no clubbing / cyanosis. No joint deformity upper and lower extremities. Good ROM, no contractures. Normal muscle tone.  Skin: no rashes, lesions, ulcers. No induration Neurologic: CN 2-12 grossly intact. Sensation intact, Strength 5/5 in all 4.  Psychiatric: Normal judgment and insight. Alert and oriented x 3. Normal mood.   Labs on Admission: I have personally reviewed following labs and imaging studies  CBC: Recent Labs  Lab 09/09/19 2142  WBC 11.0*  NEUTROABS 5.1  HGB 10.7*  HCT 28.8*  MCV 87.5  PLT 409*   Basic Metabolic Panel: Recent Labs  Lab 09/09/19 2142  NA 142  K 4.3  CL 112*  CO2 21*  GLUCOSE 80  BUN 9  CREATININE 0.86  CALCIUM 8.7*   GFR: Estimated Creatinine Clearance: 70.7 mL/min (by C-G formula based on SCr of 0.86 mg/dL). Liver Function Tests: Recent Labs  Lab 09/09/19 2142  AST 30  ALT 17  ALKPHOS 35*  BILITOT 1.4*  PROT 6.9  ALBUMIN 4.2   No results for input(s): LIPASE, AMYLASE in the last 168 hours. No results for input(s): AMMONIA in the last 168 hours. Coagulation Profile: No results for input(s): INR, PROTIME in the last 168 hours. Cardiac Enzymes: No results for input(s): CKTOTAL, CKMB, CKMBINDEX, TROPONINI in the last 168 hours. BNP (last 3 results) No results for input(s): PROBNP in the last 8760 hours. HbA1C: No results for input(s): HGBA1C in the last 72 hours. CBG: No results for input(s): GLUCAP in the last 168 hours. Lipid Profile: No results for input(s): CHOL,  HDL, LDLCALC, TRIG, CHOLHDL, LDLDIRECT in the last 72 hours. Thyroid Function Tests: No results for input(s): TSH, T4TOTAL, FREET4, T3FREE, THYROIDAB in the last 72 hours. Anemia Panel: Recent Labs    09/09/19 2142  RETICCTPCT 3.7*   Urine analysis:    Component Value Date/Time   COLORURINE YELLOW 07/24/2019 1450   APPEARANCEUR CLEAR 07/24/2019 1450   LABSPEC 1.012 07/24/2019 1450   PHURINE 5.0 07/24/2019 1450   GLUCOSEU NEGATIVE 07/24/2019 1450   HGBUR NEGATIVE 07/24/2019 1450   BILIRUBINUR neg 08/13/2019 0923   KETONESUR NEGATIVE 07/24/2019 1450   PROTEINUR Negative 08/13/2019 0923   PROTEINUR NEGATIVE 07/24/2019 1450   UROBILINOGEN 0.2 08/13/2019 0923   UROBILINOGEN 1.0 12/25/2012 1755   NITRITE neg 08/13/2019 0923   NITRITE NEGATIVE 07/24/2019 1450   LEUKOCYTESUR Moderate (2+) (A) 08/13/2019 0923   LEUKOCYTESUR SMALL (  A) 07/24/2019 1450    Radiological Exams on Admission: No results found.  EKG: Not performed.  Assessment/Plan Principal Problem:   Sickle cell anemia with crisis [facility]) Active Problems:   Chronic pain   Anemia of chronic disease  Shaunda Garza is a 49 y.o. female with medical history significant for sickle cell disease, anemia of chronic disease, chronic pain syndrome who is admitted with sickle cell pain crisis.  Sickle cell anemia with acute on chronic pain crisis: -Start weight-based Dilaudid PCA pump -Schedule IV Toradol 15 mg every 6 hours -Continue IV fluid hydration with D5-1/2 NS -Restart home OxyContin 10 mg q12h and oxycodone 10 mg q6h as needed -Continue home gabapentin -Continue antiemetics as needed -Start bowel regimen  DVT prophylaxis: Lovenox Code Status: Observation Family Communication: Discussed with patient, she has discussed with family Disposition Plan: From home, likely discharge to home when pain adequately controlled Consults called: None Admission status:  Status is: Observation  The patient remains OBS  appropriate and will d/c before 2 midnights.  Dispo: The patient is from: Home              Anticipated d/c is to: Home              Anticipated d/c date is: 1 day              Patient currently is not medically stable to d/c.  Darreld Mclean MD [facility]  If 7PM-7AM, please contact night-coverage [email or web address]  09/10/2019, 12:09 AM

## 2019-09-10 NOTE — Discharge Instructions (Signed)
Sickle Cell Anemia, Adult  Sickle cell anemia is a condition where your red blood cells are shaped like sickles. Red blood cells carry oxygen through the body. Sickle-shaped cells do not live as long as normal red blood cells. They also clump together and block blood from flowing through the blood vessels. This prevents the body from getting enough oxygen. Sickle cell anemia causes organ damage and pain. It also increases the risk of infection. Follow these instructions at home: Medicines  Take over-the-counter and prescription medicines only as told by your doctor.  If you were prescribed an antibiotic medicine, take it as told by your doctor. Do not stop taking the antibiotic even if you start to feel better.  If you develop a fever, do not take medicines to lower the fever right away. Tell your doctor about the fever. Managing pain, stiffness, and swelling  Try these methods to help with pain: ? Use a heating pad. ? Take a warm bath. ? Distract yourself, such as by watching TV. Eating and drinking  Drink enough fluid to keep your pee (urine) clear or pale yellow. Drink more in hot weather and during exercise.  Limit or avoid alcohol.  Eat a healthy diet. Eat plenty of fruits, vegetables, whole grains, and lean protein.  Take vitamins and supplements as told by your doctor. Traveling  When traveling, keep these with you: ? Your medical information. ? The names of your doctors. ? Your medicines.  If you need to take an airplane, talk to your doctor first. Activity  Rest often.  Avoid exercises that make your heart beat much faster, such as jogging. General instructions  Do not use products that have nicotine or tobacco, such as cigarettes and e-cigarettes. If you need help quitting, ask your doctor.  Consider wearing a medical alert bracelet.  Avoid being in high places (high altitudes), such as mountains.  Avoid very hot or cold temperatures.  Avoid places where the  temperature changes a lot.  Keep all follow-up visits as told by your doctor. This is important. Contact a doctor if:  A joint hurts.  Your feet or hands hurt or swell.  You feel tired (fatigued). Get help right away if:  You have symptoms of infection. These include: ? Fever. ? Chills. ? Being very tired. ? Irritability. ? Poor eating. ? Throwing up (vomiting).  You feel dizzy or faint.  You have new stomach pain, especially on the left side.  You have a an erection (priapism) that lasts more than 4 hours.  You have numbness in your arms or legs.  You have a hard time moving your arms or legs.  You have trouble talking.  You have pain that does not go away when you take medicine.  You are short of breath.  You are breathing fast.  You have a long-term cough.  You have pain in your chest.  You have a bad headache.  You have a stiff neck.  Your stomach looks bloated even though you did not eat much.  Your skin is pale.  You suddenly cannot see well. Summary  Sickle cell anemia is a condition where your red blood cells are shaped like sickles.  Follow your doctor's advice on ways to manage pain, food to eat, activities to do, and steps to take for safe travel.  Get medical help right away if you have any signs of infection, such as a fever. This information is not intended to replace advice given to you by   your health care provider. Make sure you discuss any questions you have with your health care provider. Document Revised: 08/17/2018 Document Reviewed: 05/31/2016 Elsevier Patient Education  2020 Elsevier Inc.  

## 2019-09-10 NOTE — Telephone Encounter (Signed)
Pt called stating they met with you at the hospital. Stating the pharmacy said they need a direct call for an early refill of oxycodone 10m immediate release. She wants medication sent to the [facility] on [address].

## 2019-09-11 NOTE — Telephone Encounter (Signed)
done

## 2019-09-13 ENCOUNTER — Other Ambulatory Visit: Payer: Self-pay

## 2019-09-13 DIAGNOSIS — D5700 Hb-SS disease with crisis, unspecified: Secondary | ICD-10-CM

## 2019-09-13 MED ORDER — GABAPENTIN 300 MG PO CAPS: ORAL_CAPSULE | ORAL | 1 refills | Status: DC

## 2019-09-24 ENCOUNTER — Telehealth: Payer: Self-pay | Admitting: Family Medicine

## 2019-09-24 NOTE — Telephone Encounter (Signed)
Pt called in refill on oxycodone 10mg °

## 2019-09-25 ENCOUNTER — Other Ambulatory Visit: Payer: Self-pay | Admitting: Family Medicine

## 2019-09-25 DIAGNOSIS — G894 Chronic pain syndrome: Secondary | ICD-10-CM

## 2019-09-25 MED ORDER — OXYCODONE HCL 10 MG PO TABS: 10.0000 mg | ORAL_TABLET | Freq: Four times a day (QID) | ORAL | 0 refills | Status: DC | PRN

## 2019-09-25 NOTE — Progress Notes (Signed)
Reviewed PDMP substance reporting system prior to prescribing opiate medications. No inconsistencies noted.   Meds ordered this encounter  Medications  . Oxycodone HCl 10 MG TABS    Sig: Take 1 tablet (10 mg total) by mouth every 6 (six) hours as needed for up to 15 days.    Dispense:  60 tablet    Refill:  0    Order Specific Question:   Supervising Provider    Answer:   [name], [name]     Lindsey Moore Hollis  APRN, MSN, FNP-C [facility] [address]  [place], [place] [address] [phone]  

## 2019-10-09 ENCOUNTER — Telehealth: Payer: Self-pay | Admitting: Family Medicine

## 2019-10-09 ENCOUNTER — Other Ambulatory Visit: Payer: Self-pay | Admitting: Family Medicine

## 2019-10-09 DIAGNOSIS — G894 Chronic pain syndrome: Secondary | ICD-10-CM

## 2019-10-09 MED ORDER — OXYCODONE HCL 10 MG PO TABS: 10.0000 mg | ORAL_TABLET | Freq: Four times a day (QID) | ORAL | 0 refills | Status: DC | PRN

## 2019-10-09 NOTE — Telephone Encounter (Signed)
Pt called in refill on oxycodone 10mg IR 

## 2019-10-09 NOTE — Progress Notes (Signed)
Reviewed PDMP substance reporting system prior to prescribing opiate medications. No inconsistencies noted.   Meds ordered this encounter  Medications  . Oxycodone HCl 10 MG TABS    Sig: Take 1 tablet (10 mg total) by mouth every 6 (six) hours as needed for up to 15 days.    Dispense:  60 tablet    Refill:  0    Order Specific Question:   Supervising Provider    Answer:   [name], [name]     Lindsey Carroll Moore Brennan Karam  APRN, MSN, FNP-C [facility] [address]  [place], [place] [address] [phone]  

## 2019-10-11 ENCOUNTER — Other Ambulatory Visit: Payer: Self-pay | Admitting: Family Medicine

## 2019-10-11 ENCOUNTER — Telehealth: Payer: Self-pay | Admitting: Family Medicine

## 2019-10-11 DIAGNOSIS — G894 Chronic pain syndrome: Secondary | ICD-10-CM

## 2019-10-11 MED ORDER — OXYCODONE HCL 10 MG PO TABS: 10.0000 mg | ORAL_TABLET | Freq: Four times a day (QID) | ORAL | 0 refills | Status: DC | PRN

## 2019-10-11 NOTE — Progress Notes (Signed)
Called cancelled rx  at [facility]

## 2019-10-11 NOTE — Progress Notes (Signed)
Reviewed PDMP substance reporting system prior to prescribing opiate medications. No inconsistencies noted.   Meds ordered this encounter  Medications   Oxycodone HCl 10 MG TABS    Sig: Take 1 tablet (10 mg total) by mouth every 6 (six) hours as needed for up to 15 days.    Dispense:  60 tablet    Refill:  0    Order Specific Question:   Supervising Provider    Answer:   Quentin Angst [[id number]]   Medication is out of stock at patient's preferred pharmacy. Will resend medications to [facility] [address].    [name]  APRN, MSN, FNP-C Patient Care [facility] [address] [place], [place] [address] [phone]

## 2019-10-12 ENCOUNTER — Other Ambulatory Visit: Payer: Self-pay | Admitting: Family Medicine

## 2019-10-12 DIAGNOSIS — D571 Sickle-cell disease without crisis: Secondary | ICD-10-CM

## 2019-10-14 NOTE — Telephone Encounter (Signed)
Done

## 2019-10-15 ENCOUNTER — Encounter: Payer: Self-pay | Admitting: Family Medicine

## 2019-10-15 ENCOUNTER — Other Ambulatory Visit: Payer: Self-pay

## 2019-10-15 ENCOUNTER — Ambulatory Visit (INDEPENDENT_AMBULATORY_CARE_PROVIDER_SITE_OTHER): Payer: Medicaid Other | Admitting: Family Medicine

## 2019-10-15 VITALS — BP 112/72 | HR 71 | Temp 97.9°F | Ht 60.0 in | Wt 152.6 lb

## 2019-10-15 DIAGNOSIS — D5700 Hb-SS disease with crisis, unspecified: Secondary | ICD-10-CM | POA: Diagnosis not present

## 2019-10-15 DIAGNOSIS — E559 Vitamin D deficiency, unspecified: Secondary | ICD-10-CM

## 2019-10-15 DIAGNOSIS — G894 Chronic pain syndrome: Secondary | ICD-10-CM

## 2019-10-15 DIAGNOSIS — R82998 Other abnormal findings in urine: Secondary | ICD-10-CM | POA: Diagnosis not present

## 2019-10-15 LAB — POCT URINALYSIS DIPSTICK
Bilirubin, UA: NEGATIVE
Glucose, UA: NEGATIVE
Ketones, UA: NEGATIVE
Nitrite, UA: NEGATIVE
Protein, UA: NEGATIVE
Spec Grav, UA: 1.02 (ref 1.010–1.025)
Urobilinogen, UA: 1 E.U./dL
pH, UA: 6 (ref 5.0–8.0)

## 2019-10-15 NOTE — Patient Instructions (Signed)
Discussed the importance of drinking 64 ounces of water daily. The Importance of Water. To help prevent pain crises, it is important to drink plenty of water throughout the day. This is because dehydration of red blood cells may lead to the sickling process.     Sickle Cell Anemia, Adult  Sickle cell anemia is a condition where your red blood cells are shaped like sickles. Red blood cells carry oxygen through the body. Sickle-shaped cells do not live as long as normal red blood cells. They also clump together and block blood from flowing through the blood vessels. This prevents the body from getting enough oxygen. Sickle cell anemia causes organ damage and pain. It also increases the risk of infection. Follow these instructions at home: Medicines  Take over-the-counter and prescription medicines only as told by your doctor.  If you were prescribed an antibiotic medicine, take it as told by your doctor. Do not stop taking the antibiotic even if you start to feel better.  If you develop a fever, do not take medicines to lower the fever right away. Tell your doctor about the fever. Managing pain, stiffness, and swelling  Try these methods to help with pain: ? Use a heating pad. ? Take a warm bath. ? Distract yourself, such as by watching TV. Eating and drinking  Drink enough fluid to keep your pee (urine) clear or pale yellow. Drink more in hot weather and during exercise.  Limit or avoid alcohol.  Eat a healthy diet. Eat plenty of fruits, vegetables, whole grains, and lean protein.  Take vitamins and supplements as told by your doctor. Traveling  When traveling, keep these with you: ? Your medical information. ? The names of your doctors. ? Your medicines.  If you need to take an airplane, talk to your doctor first. Activity  Rest often.  Avoid exercises that make your heart beat much faster, such as jogging. General instructions  Do not use products that have nicotine or  tobacco, such as cigarettes and e-cigarettes. If you need help quitting, ask your doctor.  Consider wearing a medical alert bracelet.  Avoid being in high places (high altitudes), such as mountains.  Avoid very hot or cold temperatures.  Avoid places where the temperature changes a lot.  Keep all follow-up visits as told by your doctor. This is important. Contact a doctor if:  A joint hurts.  Your feet or hands hurt or swell.  You feel tired (fatigued). Get help right away if:  You have symptoms of infection. These include: ? Fever. ? Chills. ? Being very tired. ? Irritability. ? Poor eating. ? Throwing up (vomiting).  You feel dizzy or faint.  You have new stomach pain, especially on the left side.  You have a an erection (priapism) that lasts more than 4 hours.  You have numbness in your arms or legs.  You have a hard time moving your arms or legs.  You have trouble talking.  You have pain that does not go away when you take medicine.  You are short of breath.  You are breathing fast.  You have a long-term cough.  You have pain in your chest.  You have a bad headache.  You have a stiff neck.  Your stomach looks bloated even though you did not eat much.  Your skin is pale.  You suddenly cannot see well. Summary  Sickle cell anemia is a condition where your red blood cells are shaped like sickles.  Follow your doctor's  advice on ways to manage pain, food to eat, activities to do, and steps to take for safe travel.  Get medical help right away if you have any signs of infection, such as a fever. This information is not intended to replace advice given to you by your health care provider. Make sure you discuss any questions you have with your health care provider. Document Revised: 08/17/2018 Document Reviewed: 05/31/2016 Elsevier Patient Education  2020 [employer].

## 2019-10-15 NOTE — Progress Notes (Signed)
[facility]   Established Patient Office Visit  Subjective:  Patient ID: Lindsey [name], female    DOB: 07-24-1970  Age: 49 y.o. MRN: [id number]  CC:  Chief Complaint  Patient presents with  . Follow-up    2 month follow up , cyst has gone down no draining, she keeping it wrapped     HPI Lindsey [name] is a very pleasant 49 year old female with a medical history significant for sickle cell disease, chronic pain syndrome, opiate dependence and tolerance, anemia of chronic disease, and history of depression presents for follow-up of chronic conditions. Patient states that sickle cell has been better controlled since long-acting opiate was added to medication regimen. Patient is having some pain to bilateral upper and lower extremities.  She last had medications this a.m. with maximum relief.  She has been hydrating consistently and taking folic acid.  She denies headache, chest pain, shortness of breath, urinary symptoms, nausea, vomiting, or diarrhea.  She has not had sick contacts, recent travel, or exposure to COVID-19.  Patient is continuing to complain of cysts to wrist, left greater than right.  Cysts have been present over the past several months.  She characterizes as nontender to touch, but have become larger over time.  Past Medical History:  Diagnosis Date  . 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 Opioid dependence in remission (HCC)   . Sickle cell crisis [facility])     Past Surgical History:  Procedure Laterality Date  . APPENDECTOMY    . CESAREAN SECTION    . OTHER SURGICAL HISTORY     c-section    Family History  Problem Relation Age of Onset  . Stroke Neg Hx        none that she knows of   . Seizures Neg Hx     Social History   Socioeconomic History  . Marital status: Married    Spouse name: Not on file  . Number of children: 2  . Years of education: Not on file  . Highest education level: Not on file    Occupational History  . Not on file  Tobacco Use  . Smoking status: Current Some Day Smoker    Packs/day: 0.00    Types: Cigars  . Smokeless tobacco: Never Used  Substance and Sexual Activity  . Alcohol use: Yes    Comment: occasionally  . Drug use: No  . Sexual activity: Yes    Birth control/protection: None  Other Topics Concern  . Not on file  Social History Narrative   Lives at home with husband and son   Right handed   Social Determinants of Health   Financial Resource Strain:   . Difficulty of Paying Living Expenses:   Food Insecurity:   . Worried About Programme researcher, broadcasting/film/video in the Last Year:   . Barista in the Last Year:   Transportation Needs:   . Freight forwarder (Medical):   [name] Lack of Transportation (Non-Medical):   Physical Activity:   . Days of Exercise per Week:   . Minutes of Exercise per Session:   Stress:   . Feeling of Stress :   Social Connections:   . Frequency of Communication with Friends and Family:   . Frequency of Social Gatherings with Friends and Family:   . Attends Religious Services:   . Active Member of Clubs or Organizations:   . Attends Banker Meetings:   [name] Marital  Status:   Intimate Partner Violence:   . Fear of Current or Ex-Partner:   . Emotionally Abused:   [name] Physically Abused:   . Sexually Abused:     Outpatient Medications Prior to Visit  Medication Sig Dispense Refill  . gabapentin (NEURONTIN) 300 MG capsule Take 1 capsule by mouth three times daily 90 capsule 1  . ibuprofen (ADVIL) 800 MG tablet TAKE 1 TABLET(800 MG) BY MOUTH EVERY 8 HOURS AS NEEDED 60 tablet 5  . oxyCODONE (OXYCONTIN) 10 mg 12 hr tablet Take 1 tablet (10 mg total) by mouth every 12 (twelve) hours. 60 tablet 0  . Oxycodone HCl 10 MG TABS Take 1 tablet (10 mg total) by mouth every 6 (six) hours as needed for up to 15 days. 60 tablet 0   No facility-administered medications prior to visit.    No Known Allergies  ROS Review of  Systems  Constitutional: Negative.   Respiratory: Negative.   Cardiovascular: Negative.   Gastrointestinal: Negative.   Genitourinary: Negative.   Musculoskeletal: Positive for arthralgias, back pain and myalgias.  Neurological: Negative.   Hematological: Negative.   Psychiatric/Behavioral: Negative.       Objective:    Physical Exam  Constitutional: She is oriented to person, place, and time. She appears well-developed and well-nourished.  HENT:  Head: Normocephalic.  Eyes: Pupils are equal, round, and reactive to light.  Cardiovascular: Normal rate and regular rhythm.  Pulmonary/Chest: Effort normal.  Abdominal: Soft.  Musculoskeletal:        General: Normal range of motion.     Cervical back: Normal range of motion.  Neurological: She is alert and oriented to person, place, and time.  Skin: Skin is warm.       There were no vitals taken for this visit. Wt Readings from Last 3 Encounters:  09/09/19 155 lb (70.3 kg)  08/13/19 157 lb (71.2 kg)  07/10/19 152 lb (68.9 kg)     Health Maintenance Due  Topic Date Due  . Hepatitis C Screening  Never done  . COVID-19 Vaccine (1) Never done  . PAP SMEAR-Modifier  Never done    There are no preventive care reminders to display for this patient.  Lab Results  Component Value Date   TSH 2.480 02/20/2019   Lab Results  Component Value Date   WBC 12.9 (H) 09/10/2019   HGB 10.0 (L) 09/10/2019   HCT 26.3 (L) 09/10/2019   MCV 85.7 09/10/2019   PLT 336 09/10/2019   Lab Results  Component Value Date   NA 142 09/10/2019   K 3.6 09/10/2019   CO2 25 09/10/2019   GLUCOSE 121 (H) 09/10/2019   BUN 11 09/10/2019   CREATININE 0.98 09/10/2019   BILITOT 1.4 (H) 09/09/2019   ALKPHOS 35 (L) 09/09/2019   AST 30 09/09/2019   ALT 17 09/09/2019   PROT 6.9 09/09/2019   ALBUMIN 4.2 09/09/2019   CALCIUM 8.4 (L) 09/10/2019   ANIONGAP 5 09/10/2019   Lab Results  Component Value Date   CHOL  07/25/2007    119        ATP III  CLASSIFICATION:  <200     mg/dL   Desirable  200-239  mg/dL   Borderline High  >=240    mg/dL   High   Lab Results  Component Value Date   HDL 33 (L) 07/25/2007   Lab Results  Component Value Date   LDLCALC  07/25/2007    78  Total Cholesterol/HDL:CHD Risk Coronary Heart Disease Risk Table                     Men   Women  1/2 Average Risk   3.4   3.3   Lab Results  Component Value Date   TRIG 38 07/25/2007   Lab Results  Component Value Date   CHOLHDL 3.6 07/25/2007   No results found for: HGBA1C    Assessment & Plan:   Problem List Items Addressed This Visit      Other   Sickle cell anemia with pain (HCC) - Primary   Relevant Orders   POCT Urinalysis Dipstick   Chronic pain   Relevant Orders   [id number] 11+Oxyco+Alc+Crt-Bund     Sickle cell anemia with pain (HCC) Sickle cell disease - Continue Hydrea. We discussed the need for good hydration, monitoring of hydration status, avoidance of heat, cold, stress, and infection triggers. We discussed the risks and benefits of Hydrea, including bone marrow suppression, the possibility of GI upset, skin ulcers, hair thinning, and teratogenicity. The patient was reminded of the need to seek medical attention of any symptoms of bleeding, anemia, or infection. Continue folic acid 1 mg daily to prevent aplastic bone marrow crises.   Pulmonary evaluation - Patient denies severe recurrent wheezes, shortness of breath with exercise, or persistent cough. If these symptoms develop, pulmonary function tests with spirometry will be ordered, and if abnormal, plan on referral to Pulmonology for further evaluation.  Cardiac - Routine screening for pulmonary hypertension is not recommended.  Eye - High risk of proliferative retinopathy. Annual eye exam with retinal exam recommended to patient.  Immunization status - She declines vaccines today. Yearly influenza vaccination is recommended, as well as being up to date with Meningococcal  and Pneumococcal vaccines.   Acute and chronic painful episodes -   No medication changes warranted at this time.   - POCT Urinalysis Dipstick - Sickle Cell Panel  Chronic pain syndrome Reviewed PDMP substance reporting system prior to prescribing opiate medications. No inconsistencies noted.   - [id number] 11+Oxyco+Alc+Crt-Bund  Vitamin D deficiency - Sickle Cell Panel   Follow-up: Return in about 3 months (around 01/15/2020).    [name]  APRN, MSN, FNP-C Patient Care [facility] [address] [place], [place] [address] [phone]

## 2019-10-16 ENCOUNTER — Telehealth: Payer: Self-pay

## 2019-10-16 ENCOUNTER — Other Ambulatory Visit: Payer: Self-pay | Admitting: Family Medicine

## 2019-10-16 DIAGNOSIS — E559 Vitamin D deficiency, unspecified: Secondary | ICD-10-CM

## 2019-10-16 LAB — CMP14+CBC/D/PLT+FER+RETIC+V...
ALT: 10 IU/L (ref 0–32)
AST: 16 IU/L (ref 0–40)
Albumin/Globulin Ratio: 1.8 (ref 1.2–2.2)
Albumin: 4.2 g/dL (ref 3.8–4.8)
Alkaline Phosphatase: 42 IU/L — ABNORMAL LOW (ref 48–121)
BUN/Creatinine Ratio: 10 (ref 9–23)
BUN: 9 mg/dL (ref 6–24)
Basophils Absolute: 0.1 10*3/uL (ref 0.0–0.2)
Basos: 1 %
Bilirubin Total: 0.9 mg/dL (ref 0.0–1.2)
CO2: 22 mmol/L (ref 20–29)
Calcium: 9.1 mg/dL (ref 8.7–10.2)
Chloride: 108 mmol/L — ABNORMAL HIGH (ref 96–106)
Creatinine, Ser: 0.88 mg/dL (ref 0.57–1.00)
EOS (ABSOLUTE): 0.6 10*3/uL — ABNORMAL HIGH (ref 0.0–0.4)
Eos: 7 %
Ferritin: 143 ng/mL (ref 15–150)
GFR calc Af Amer: 90 mL/min/{1.73_m2} (ref >59)
GFR calc non Af Amer: 78 mL/min/{1.73_m2} (ref >59)
Globulin, Total: 2.3 g/dL (ref 1.5–4.5)
Glucose: 88 mg/dL (ref 65–99)
Hematocrit: 31.8 % — ABNORMAL LOW (ref 34.0–46.6)
Hemoglobin: 10.9 g/dL — ABNORMAL LOW (ref 11.1–15.9)
Immature Grans (Abs): 0 10*3/uL (ref 0.0–0.1)
Immature Granulocytes: 0 %
Lymphocytes Absolute: 2.8 10*3/uL (ref 0.7–3.1)
Lymphs: 29 %
MCH: 32.4 pg (ref 26.6–33.0)
MCHC: 34.3 g/dL (ref 31.5–35.7)
MCV: 95 fL (ref 79–97)
Monocytes Absolute: 0.5 10*3/uL (ref 0.1–0.9)
Monocytes: 5 %
Neutrophils Absolute: 5.7 10*3/uL (ref 1.4–7.0)
Neutrophils: 58 %
Platelets: 452 10*3/uL — ABNORMAL HIGH (ref 150–450)
Potassium: 4.5 mmol/L (ref 3.5–5.2)
RBC: 3.36 x10E6/uL — ABNORMAL LOW (ref 3.77–5.28)
RDW: 15.6 % — ABNORMAL HIGH (ref 11.7–15.4)
Retic Ct Pct: 3.2 % — ABNORMAL HIGH (ref 0.6–2.6)
Sodium: 141 mmol/L (ref 134–144)
Total Protein: 6.5 g/dL (ref 6.0–8.5)
Vit D, 25-Hydroxy: 16.3 ng/mL — ABNORMAL LOW (ref 30.0–100.0)
WBC: 9.6 10*3/uL (ref 3.4–10.8)

## 2019-10-16 MED ORDER — ERGOCALCIFEROL 1.25 MG (50000 UT) PO CAPS: 50000.0000 [IU] | ORAL_CAPSULE | ORAL | 1 refills | Status: DC

## 2019-10-16 NOTE — Progress Notes (Signed)
Meds ordered this encounter  Medications   ergocalciferol (VITAMIN D2) 1.25 MG (50000 UT) capsule    Sig: Take 1 capsule (50,000 Units total) by mouth once a week.    Dispense:  12 capsule    Refill:  1    Order Specific Question:   Supervising Provider    Answer:   [name], [name]   Tritia Endo Moore Brinlee Gambrell  APRN, MSN, FNP-C [facility] [address]  [place], [place] [address] [phone]  

## 2019-10-16 NOTE — Telephone Encounter (Signed)
-----   Message from Lachina M Hollis, FNP sent at 10/16/2019 12:22 PM EDT ----- Regarding: lab results Inform patient that vitamin D level is decreased, which is consistent with vitamin d deficiency. Will start drisdol 50,000 IU weekly. Will recheck level in 12 weeks. Recommend foods that are high in vitamin D such as sardines, eggs, etc.    Lachina Moore Hollis  APRN, MSN, FNP-C [facility] [address]  [place], [place] [address] [phone]   

## 2019-10-16 NOTE — Telephone Encounter (Signed)
-----   Message from Massie Maroon, [place] sent at 10/16/2019 12:22 PM EDT ----- Regarding: lab results Inform patient that vitamin D level is decreased, which is consistent with vitamin d deficiency. Will start drisdol 50,000 IU weekly. Will recheck level in 12 weeks. Recommend foods that are high in vitamin D such as sardines, eggs, etc.    [name]  APRN, MSN, FNP-C Patient Care [facility] [address] [place], [place] [address] [phone]

## 2019-10-16 NOTE — Telephone Encounter (Signed)
Called and notified pt of her lab results, inform her vit d level is low and start  Taking vit d 50,000 units. This will rechecked in 12 weeks, to try eat foods that contains vit d. Pt understood results w/o and any concerns.

## 2019-10-17 LAB — SPECIMEN STATUS REPORT

## 2019-10-21 ENCOUNTER — Other Ambulatory Visit: Payer: Self-pay | Admitting: Family Medicine

## 2019-10-21 ENCOUNTER — Telehealth: Payer: Self-pay | Admitting: Family Medicine

## 2019-10-21 DIAGNOSIS — G894 Chronic pain syndrome: Secondary | ICD-10-CM

## 2019-10-21 MED ORDER — OXYCODONE HCL 10 MG PO TABS: 10.0000 mg | ORAL_TABLET | Freq: Four times a day (QID) | ORAL | 0 refills | Status: DC | PRN

## 2019-10-21 NOTE — Progress Notes (Signed)
Reviewed PDMP substance reporting system prior to prescribing opiate medications. No inconsistencies noted.   Meds ordered this encounter  Medications  . Oxycodone HCl 10 MG TABS    Sig: Take 1 tablet (10 mg total) by mouth every 6 (six) hours as needed for up to 15 days.    Dispense:  60 tablet    Refill:  0    Order Specific Question:   Supervising Provider    Answer:   [name], [name]     Lindsey Mowers Moore Nykiah Ma  APRN, MSN, FNP-C [facility] [address]  [place], [place] [address] [phone]  

## 2019-10-22 LAB — DRUG SCREEN 764883 11+OXYCO+ALC+CRT-BUND
Amphetamines, Urine: NEGATIVE ng/mL
BENZODIAZ UR QL: NEGATIVE ng/mL
Barbiturate: NEGATIVE ng/mL
Cannabinoid Quant, Ur: NEGATIVE ng/mL
Cocaine (Metabolite): NEGATIVE ng/mL
Creatinine: 82.4 mg/dL (ref 20.0–300.0)
Ethanol: NEGATIVE %
Meperidine: NEGATIVE ng/mL
Methadone Screen, Urine: NEGATIVE ng/mL
Phencyclidine: NEGATIVE ng/mL
Propoxyphene: NEGATIVE ng/mL
Tramadol: NEGATIVE ng/mL
pH, Urine: 6 (ref 4.5–8.9)

## 2019-10-22 LAB — OXYCODONE/OXYMORPHONE, CONFIRM
OXYCODONE/OXYMORPH: POSITIVE — AB
OXYCODONE: 584 ng/mL
OXYCODONE: POSITIVE — AB
OXYMORPHONE (GC/MS): >3000 ng/mL
OXYMORPHONE: POSITIVE — AB

## 2019-10-22 LAB — OPIATES CONFIRMATION, URINE: Opiates: NEGATIVE ng/mL

## 2019-10-23 ENCOUNTER — Telehealth (HOSPITAL_COMMUNITY): Payer: Self-pay | Admitting: *Deleted

## 2019-10-23 ENCOUNTER — Encounter (HOSPITAL_COMMUNITY): Payer: Self-pay | Admitting: Internal Medicine

## 2019-10-23 ENCOUNTER — Inpatient Hospital Stay (HOSPITAL_COMMUNITY)
Admission: RE | Admit: 2019-10-23 | Discharge: 2019-10-25 | DRG: 812 | Disposition: A | Discharge status: Home / Self Care | Payer: Medicaid Other | Attending: Internal Medicine | Admitting: Internal Medicine

## 2019-10-23 DIAGNOSIS — D5700 Hb-SS disease with crisis, unspecified: Secondary | ICD-10-CM | POA: Diagnosis present

## 2019-10-23 DIAGNOSIS — Z79891 Long term (current) use of opiate analgesic: Secondary | ICD-10-CM

## 2019-10-23 DIAGNOSIS — F329 Major depressive disorder, single episode, unspecified: Secondary | ICD-10-CM | POA: Diagnosis present

## 2019-10-23 DIAGNOSIS — Z20822 Contact with and (suspected) exposure to covid-19: Secondary | ICD-10-CM | POA: Diagnosis present

## 2019-10-23 DIAGNOSIS — Z79899 Other long term (current) drug therapy: Secondary | ICD-10-CM | POA: Diagnosis not present

## 2019-10-23 DIAGNOSIS — G894 Chronic pain syndrome: Secondary | ICD-10-CM | POA: Diagnosis present

## 2019-10-23 DIAGNOSIS — F17290 Nicotine dependence, other tobacco product, uncomplicated: Secondary | ICD-10-CM | POA: Diagnosis present

## 2019-10-23 DIAGNOSIS — D571 Sickle-cell disease without crisis: Secondary | ICD-10-CM | POA: Diagnosis present

## 2019-10-23 DIAGNOSIS — D638 Anemia in other chronic diseases classified elsewhere: Secondary | ICD-10-CM | POA: Diagnosis present

## 2019-10-23 DIAGNOSIS — D72829 Elevated white blood cell count, unspecified: Secondary | ICD-10-CM | POA: Diagnosis present

## 2019-10-23 DIAGNOSIS — G8929 Other chronic pain: Secondary | ICD-10-CM | POA: Diagnosis present

## 2019-10-23 LAB — CBC WITH DIFFERENTIAL/PLATELET
Abs Immature Granulocytes: 0.04 10*3/uL (ref 0.00–0.07)
Basophils Absolute: 0.1 10*3/uL (ref 0.0–0.1)
Basophils Relative: 1 %
Eosinophils Absolute: 0.4 10*3/uL (ref 0.0–0.5)
Eosinophils Relative: 3 %
HCT: 31.6 % — ABNORMAL LOW (ref 36.0–46.0)
Hemoglobin: 12 g/dL (ref 12.0–15.0)
Immature Granulocytes: 0 %
Lymphocytes Relative: 36 %
Lymphs Abs: 4.2 10*3/uL — ABNORMAL HIGH (ref 0.7–4.0)
MCH: 32.8 pg (ref 26.0–34.0)
MCHC: 38 g/dL — ABNORMAL HIGH (ref 30.0–36.0)
MCV: 86.3 fL (ref 80.0–100.0)
Monocytes Absolute: 0.6 10*3/uL (ref 0.1–1.0)
Monocytes Relative: 5 %
Neutro Abs: 6.4 10*3/uL (ref 1.7–7.7)
Neutrophils Relative %: 55 %
Platelets: 420 10*3/uL — ABNORMAL HIGH (ref 150–400)
RBC: 3.66 MIL/uL — ABNORMAL LOW (ref 3.87–5.11)
RDW: 14.1 % (ref 11.5–15.5)
WBC: 11.6 10*3/uL — ABNORMAL HIGH (ref 4.0–10.5)
nRBC: 0.5 % — ABNORMAL HIGH (ref 0.0–0.2)

## 2019-10-23 LAB — COMPREHENSIVE METABOLIC PANEL
ALT: 15 U/L (ref 0–44)
AST: 19 U/L (ref 15–41)
Albumin: 4.6 g/dL (ref 3.5–5.0)
Alkaline Phosphatase: 39 U/L (ref 38–126)
Anion gap: 6 (ref 5–15)
BUN: 8 mg/dL (ref 6–20)
CO2: 26 mmol/L (ref 22–32)
Calcium: 9.1 mg/dL (ref 8.9–10.3)
Chloride: 109 mmol/L (ref 98–111)
Creatinine, Ser: 0.81 mg/dL (ref 0.44–1.00)
GFR calc Af Amer: >60 mL/min (ref >60)
GFR calc non Af Amer: >60 mL/min (ref >60)
Glucose, Bld: 107 mg/dL — ABNORMAL HIGH (ref 70–99)
Potassium: 3.9 mmol/L (ref 3.5–5.1)
Sodium: 141 mmol/L (ref 135–145)
Total Bilirubin: 1.3 mg/dL — ABNORMAL HIGH (ref 0.3–1.2)
Total Protein: 7.9 g/dL (ref 6.5–8.1)

## 2019-10-23 LAB — RETICULOCYTES
Immature Retic Fract: 17 % — ABNORMAL HIGH (ref 2.3–15.9)
RBC.: 3.68 MIL/uL — ABNORMAL LOW (ref 3.87–5.11)
Retic Count, Absolute: 159 10*3/uL (ref 19.0–186.0)
Retic Ct Pct: 4.3 % — ABNORMAL HIGH (ref 0.4–3.1)

## 2019-10-23 LAB — SARS CORONAVIRUS 2 BY RT PCR (HOSPITAL ORDER, PERFORMED IN ~~LOC~~ HOSPITAL LAB): SARS Coronavirus 2: NEGATIVE

## 2019-10-23 MED ORDER — KETOROLAC TROMETHAMINE 15 MG/ML IJ SOLN
15.0000 mg | Freq: Four times a day (QID) | INTRAMUSCULAR | Status: DC
  Administered 2019-10-24 – 2019-10-25 (×6): 15 mg via INTRAVENOUS
  Filled 2019-10-23 (×6): qty 1

## 2019-10-23 MED ORDER — KETOROLAC TROMETHAMINE 30 MG/ML IJ SOLN
15.0000 mg | Freq: Once | INTRAMUSCULAR | Status: AC
  Administered 2019-10-23: 15 mg via INTRAVENOUS
  Filled 2019-10-23: qty 1

## 2019-10-23 MED ORDER — ENOXAPARIN SODIUM 40 MG/0.4ML ~~LOC~~ SOLN
40.0000 mg | SUBCUTANEOUS | Status: DC
  Administered 2019-10-23 – 2019-10-25 (×2): 40 mg via SUBCUTANEOUS
  Filled 2019-10-23 (×2): qty 0.4

## 2019-10-23 MED ORDER — OXYCODONE HCL ER 10 MG PO T12A
10.0000 mg | EXTENDED_RELEASE_TABLET | Freq: Two times a day (BID) | ORAL | Status: DC
  Administered 2019-10-23 – 2019-10-25 (×4): 10 mg via ORAL
  Filled 2019-10-23 (×4): qty 1

## 2019-10-23 MED ORDER — DIPHENHYDRAMINE HCL 25 MG PO CAPS: 25.0000 mg | ORAL_CAPSULE | ORAL | Status: DC | PRN

## 2019-10-23 MED ORDER — SODIUM CHLORIDE 0.9% FLUSH: 9.0000 mL | INTRAVENOUS | Status: DC | PRN

## 2019-10-23 MED ORDER — NALOXONE HCL 0.4 MG/ML IJ SOLN: 0.4000 mg | INTRAMUSCULAR | Status: DC | PRN

## 2019-10-23 MED ORDER — POLYETHYLENE GLYCOL 3350 17 G PO PACK: 17.0000 g | PACK | Freq: Every day | ORAL | Status: DC | PRN

## 2019-10-23 MED ORDER — ACETAMINOPHEN 500 MG PO TABS
1000.0000 mg | ORAL_TABLET | Freq: Once | ORAL | Status: AC
  Administered 2019-10-23: 1000 mg via ORAL
  Filled 2019-10-23: qty 2

## 2019-10-23 MED ORDER — SENNOSIDES-DOCUSATE SODIUM 8.6-50 MG PO TABS
1.0000 | ORAL_TABLET | Freq: Two times a day (BID) | ORAL | Status: DC
  Administered 2019-10-23 – 2019-10-25 (×4): 1 via ORAL
  Filled 2019-10-23 (×4): qty 1

## 2019-10-23 MED ORDER — ONDANSETRON HCL 4 MG/2ML IJ SOLN: 4.0000 mg | Freq: Four times a day (QID) | INTRAMUSCULAR | Status: DC | PRN

## 2019-10-23 MED ORDER — GABAPENTIN 300 MG PO CAPS
300.0000 mg | ORAL_CAPSULE | Freq: Three times a day (TID) | ORAL | Status: DC
  Administered 2019-10-23 – 2019-10-25 (×5): 300 mg via ORAL
  Filled 2019-10-23 (×5): qty 1

## 2019-10-23 MED ORDER — HYDROMORPHONE 1 MG/ML IV SOLN
INTRAVENOUS | Status: DC
  Administered 2019-10-23: 4.5 mL via INTRAVENOUS
  Administered 2019-10-23: 30 mg via INTRAVENOUS
  Administered 2019-10-23: 2.5 mg via INTRAVENOUS
  Administered 2019-10-24: 1.5 mL via INTRAVENOUS
  Administered 2019-10-24: 1 mg via INTRAVENOUS
  Administered 2019-10-24: 1.5 mL via INTRAVENOUS
  Filled 2019-10-23: qty 30

## 2019-10-23 MED ORDER — SODIUM CHLORIDE 0.45 % IV SOLN: INTRAVENOUS | Status: DC

## 2019-10-23 MED ORDER — HYDROMORPHONE HCL 2 MG/ML IJ SOLN
2.0000 mg | Freq: Once | INTRAMUSCULAR | Status: AC
  Administered 2019-10-23: 2 mg via INTRAVENOUS
  Filled 2019-10-23: qty 1

## 2019-10-23 NOTE — H&P (Signed)
H&P  Patient Demographics:  Mario Voong, is a 49 y.o. female  MRN: [id number]   DOB - [birth year]  Admit Date - 10/23/2019  Outpatient Primary MD for the patient is Dorena Dew, FNP      HPI:   Airyn Ellzey is a 49 year old female with a medical history significant for sickle cell disease, chronic pain syndrome, opiate dependence, history of opiate addiction, history of vitamin D deficiency, major depression, and anemia of chronic disease presents complaining of bilateral lower extremity and right upper extremity pain that is consistent with previous crisis.  Patient states the pain intensity increased on yesterday and has been uncontrolled by home medication.  She last had oxycodone this a.m. without sustained relief.  Pain intensity is 9/10 characterized as constant and throbbing. Patient also endorses some lightheadedness.  She denies dizziness, chest pain, shortness of breath, urinary symptoms, fever, chills, nausea, vomiting, or diarrhea.  She also denies headache or blurred vision.  No sick contacts, recent travel, or exposure to COVID-19.  She is not been vaccinated against COVID-19.  Sickle cell day infusion center course: Patient found to have vitals:BP (!) 100/55 (BP Location: Right Arm)   Pulse 68   Temp 97.7 F (36.5 C) (Temporal)   Resp 12   LMP 10/08/2019   SpO2 96%  WBCs 11.6, hemoglobin 12.0, and platelets 420.  Bilirubin is slightly elevated at 1.3.  COVID-19 test pending.  Pain persists despite IV Dilaudid, IV Toradol, and IV fluids.  Patient admitted to [place] for further management of sickle cell pain crisis.  Review of systems:  In addition to the HPI above, patient reports Review of Systems  Constitutional: Negative for chills and fever.  HENT: Negative.   Eyes: Negative.   Respiratory: Negative.  Negative for shortness of breath.   Cardiovascular: Negative.  Negative for chest pain and palpitations.  Gastrointestinal: Negative.  Negative for constipation,  diarrhea, nausea and vomiting.  Genitourinary: Negative.  Negative for dysuria.  Musculoskeletal: Positive for back pain and joint pain.  Neurological: Negative.   Psychiatric/Behavioral: Negative.   A full 10 point Review of Systems was done, except as stated above, all other Review of Systems were negative.  With Past History of the following :   Past Medical History:  Diagnosis Date  . Opiate abuse, episodic ([place]) 09/25/2017  . Opioid dependence in remission ([place])   . Sickle cell crisis [facility])       Past Surgical History:  Procedure Laterality Date  . APPENDECTOMY    . CESAREAN SECTION    . OTHER SURGICAL HISTORY     c-section     Social History:   Social History   Tobacco Use  . Smoking status: Current Some Day Smoker    Packs/day: 0.00    Types: Cigars  . Smokeless tobacco: Never Used  Substance Use Topics  . Alcohol use: Yes    Comment: occasionally     Lives - At home   Family History :   Family History  Problem Relation Age of Onset  . Stroke Neg Hx        none that she knows of   . Seizures Neg Hx      Home Medications:   Prior to Admission medications   Medication Sig Start Date End Date Taking? Authorizing Provider  ergocalciferol (VITAMIN D2) 1.25 MG (50000 UT) capsule Take 1 capsule (50,000 Units total) by mouth once a week. 10/16/19   Dorena Dew, FNP  gabapentin (NEURONTIN)  300 MG capsule Take 1 capsule by mouth three times daily 09/13/19   Massie Maroon, FNP  ibuprofen (ADVIL) 800 MG tablet TAKE 1 TABLET(800 MG) BY MOUTH EVERY 8 HOURS AS NEEDED 10/14/19   Massie Maroon, FNP  oxyCODONE (OXYCONTIN) 10 mg 12 hr tablet Take 1 tablet (10 mg total) by mouth every 12 (twelve) hours. 09/10/19   Massie Maroon, FNP  Oxycodone HCl 10 MG TABS Take 1 tablet (10 mg total) by mouth every 6 (six) hours as needed for up to 15 days. 10/26/19 11/10/19  Massie Maroon, FNP     Allergies:   No Known Allergies   Physical Exam:   Vitals:   Vitals:    10/23/19 1247 10/23/19 1500  BP: (!) 102/48 (!) 100/55  Pulse: 87 68  Resp: 18 12  Temp: 97.7 F (36.5 C)   SpO2: 99% 96%    Physical Exam: Constitutional: Patient appears well-developed and well-nourished. Not in obvious distress. HENT: Normocephalic, atraumatic, External right and left ear normal. Oropharynx is clear and moist.  Eyes: Conjunctivae and EOM are normal. PERRLA, no scleral icterus. Neck: Normal ROM. Neck supple. No JVD. No tracheal deviation. No thyromegaly. CVS: RRR, S1/S2 +, no murmurs, no gallops, no carotid bruit.  Pulmonary: Effort and breath sounds normal, no stridor, rhonchi, wheezes, rales.  Abdominal: Soft. BS +, no distension, tenderness, rebound or guarding.  Musculoskeletal: Normal range of motion. No edema and no tenderness.  Lymphadenopathy: No lymphadenopathy noted, cervical, inguinal or axillary Neuro: Alert. Normal reflexes, muscle tone coordination. No cranial nerve deficit. Skin: Skin is warm and dry. No rash noted. Not diaphoretic. No erythema. No pallor. Psychiatric: Normal mood and affect. Behavior, judgment, thought content normal.   Data Review:   CBC Recent Labs  Lab 10/23/19 1240  WBC 11.6*  HGB 12.0  HCT 31.6*  PLT 420*  MCV 86.3  MCH 32.8  MCHC 38.0*  RDW 14.1  LYMPHSABS 4.2*  MONOABS 0.6  EOSABS 0.4  BASOSABS 0.1   ------------------------------------------------------------------------------------------------------------------  Chemistries  Recent Labs  Lab 10/23/19 1240  NA 141  K 3.9  CL 109  CO2 26  GLUCOSE 107*  BUN 8  CREATININE 0.81  CALCIUM 9.1  AST 19  ALT 15  ALKPHOS 39  BILITOT 1.3*   ------------------------------------------------------------------------------------------------------------------ estimated creatinine clearance is 74.5 mL/min (by C-G formula based on SCr of 0.81  mg/dL). ------------------------------------------------------------------------------------------------------------------ No results for input(s): TSH, T4TOTAL, T3FREE, THYROIDAB in the last 72 hours.  Invalid input(s): FREET3  Coagulation profile No results for input(s): INR, PROTIME in the last 168 hours. ------------------------------------------------------------------------------------------------------------------- No results for input(s): DDIMER in the last 72 hours. -------------------------------------------------------------------------------------------------------------------  Cardiac Enzymes No results for input(s): CKMB, TROPONINI, MYOGLOBIN in the last 168 hours.  Invalid input(s): CK ------------------------------------------------------------------------------------------------------------------ No results found for: BNP  ---------------------------------------------------------------------------------------------------------------  Urinalysis    Component Value Date/Time   COLORURINE YELLOW 07/24/2019 1450   APPEARANCEUR CLEAR 07/24/2019 1450   LABSPEC 1.012 07/24/2019 1450   PHURINE 5.0 07/24/2019 1450   GLUCOSEU NEGATIVE 07/24/2019 1450   HGBUR NEGATIVE 07/24/2019 1450   BILIRUBINUR neg 10/15/2019 0949   KETONESUR NEGATIVE 07/24/2019 1450   PROTEINUR Negative 10/15/2019 0949   PROTEINUR NEGATIVE 07/24/2019 1450   UROBILINOGEN 1.0 10/15/2019 0949   UROBILINOGEN 1.0 12/25/2012 1755   NITRITE neg 10/15/2019 0949   NITRITE NEGATIVE 07/24/2019 1450   LEUKOCYTESUR Small (1+) (A) 10/15/2019 0949   LEUKOCYTESUR SMALL (A) 07/24/2019 1450    ----------------------------------------------------------------------------------------------------------------   Imaging Results:  No results found.    Assessment & Plan:  Principal Problem:   Sickle cell pain crisis (HCC) Active Problems:   Leukocytosis   Chronic pain   Anemia of chronic disease  Sickle  cell disease with pain crisis:  Admit to med surg for management of sickle cell crisis.  Conitinue weight based dilaudid PCA with settings of 0.5 mg, 10-minute lockout, and 3 mg/h Toradol 15 mg IV every 6 hours for total of 5 days OxyContin 10 mg every 12 hours Monitor vital signs closely, reevaluate pain scale regularly, and supplemental oxygen as needed. Patient will be reevaluated for pain in the context of function and relationship to baseline as care progresses.  Leukocytosis: Mild leukocytosis.  Patient is afebrile without any signs of inflammation or infection.  COVID-19 test pending.  Urine culture urinalysis pending.  Follow closely without antibiotics at this time.  Repeat CBC in a.m.  Sickle cell anemia: Hemoglobin is 12.0 which is consistent with patient's baseline.  There is no clinical indication for blood transfusion at this time.  Continue to follow CBC. Continue folic acid 1 mg daily  Chronic pain syndrome: Continue OxyContin 10 mg every 12 hours Hold oxycodone, use PCA Dilaudid as substitute    DVT Prophylaxis: Subcut Lovenox and SCDs  AM Labs Ordered, also please review Full Orders  Family Communication: Admission, patient's condition and plan of care including tests being ordered have been discussed with the patient who indicate understanding and agree with the plan and Code Status.  Code Status: Full Code  Consults called: None    Admission status: Inpatient    Time spent in minutes : 50 minutes [name]  APRN, MSN, FNP-C Patient Care [facility] [address] [place], [place] [address] [phone]  10/23/2019 at 3:01 PM

## 2019-10-23 NOTE — Progress Notes (Signed)
Patient admitted to the day hospital for treatment of sickle cell pain crisis. Patient reported pain rated 6/10 in the right leg . Patient placed on Dilaudid PCA, given dilaudid IV push, IV Toradol, PO Tylenol and hydrated with IV fluids. Patient transferred to [facility] room 25. Report given to Amy RN. Reported pain on transfer was 4/10. Patient alert, oriented and transported in a wheelchair.

## 2019-10-23 NOTE — H&P (Signed)
[facility] History and Physical   Date: 10/23/2019  Patient name: Lindsey [name] Medical record number: [id number] Date of birth: [birth year] Age: 49 y.o. Gender: female PCP: Dorena Dew, FNP  Attending physician: Tresa Garter, MD  Chief Complaint: Sickle cell pain   History of Present Illness: Lindsey [name] is a 49 year old female with a medical history significant for sickle cell disease, chronic pain syndrome, opiate dependence, history of opiate addiction, history of vitamin D deficiency, major depression, and anemia of chronic disease presents complaining of bilateral lower extremity and right upper extremity pain that is consistent with previous crisis.  Patient states the pain intensity increased on yesterday and has been uncontrolled by home medication.  She last had oxycodone this a.m. without sustained relief.  Pain intensity is 9/10 characterized as constant and throbbing. Patient also endorses some lightheadedness.  She denies dizziness, chest pain, shortness of breath, urinary symptoms, fever, chills, nausea, vomiting, or diarrhea.  She also denies headache or blurred vision.  No sick contacts, recent travel, or exposure to COVID-19.  She is not been vaccinated against COVID-19.  Meds: Medications Prior to Admission  Medication Sig Dispense Refill Last Dose  . ergocalciferol (VITAMIN D2) 1.25 MG (50000 UT) capsule Take 1 capsule (50,000 Units total) by mouth once a week. 12 capsule 1   . gabapentin (NEURONTIN) 300 MG capsule Take 1 capsule by mouth three times daily 90 capsule 1   . ibuprofen (ADVIL) 800 MG tablet TAKE 1 TABLET(800 MG) BY MOUTH EVERY 8 HOURS AS NEEDED 60 tablet 5   . oxyCODONE (OXYCONTIN) 10 mg 12 hr tablet Take 1 tablet (10 mg total) by mouth every 12 (twelve) hours. 60 tablet 0   . [START ON 10/26/2019] Oxycodone HCl 10 MG TABS Take 1 tablet (10 mg total) by mouth every 6 (six) hours as needed for up to 15 days. 60 tablet 0      Allergies: Patient has no known allergies. Past Medical History:  Diagnosis Date  . Opiate abuse, episodic ([place]) 09/25/2017  . Opioid dependence in remission ([place])   . Sickle cell crisis [facility])    Past Surgical History:  Procedure Laterality Date  . APPENDECTOMY    . CESAREAN SECTION    . OTHER SURGICAL HISTORY     c-section   Family History  Problem Relation Age of Onset  . Stroke Neg Hx        none that she knows of   . Seizures Neg Hx    Social History   Socioeconomic History  . Marital status: Married    Spouse name: Not on file  . Number of children: 2  . Years of education: Not on file  . Highest education level: Not on file  Occupational History  . Not on file  Tobacco Use  . Smoking status: Current Some Day Smoker    Packs/day: 0.00    Types: Cigars  . Smokeless tobacco: Never Used  Vaping Use  . Vaping Use: Former  Substance and Sexual Activity  . Alcohol use: Yes    Comment: occasionally  . Drug use: No  . Sexual activity: Yes    Birth control/protection: None  Other Topics Concern  . Not on file  Social History Narrative   Lives at home with husband and son   Right handed   Social Determinants of Health   Financial Resource Strain:   . Difficulty of Paying Living Expenses:   Food Insecurity:   .  Worried About Programme researcher, broadcasting/film/video in the Last Year:   . Barista in the Last Year:   Transportation Needs:   . Freight forwarder (Medical):   [name] Lack of Transportation (Non-Medical):   Physical Activity:   . Days of Exercise per Week:   . Minutes of Exercise per Session:   Stress:   . Feeling of Stress :   Social Connections:   . Frequency of Communication with Friends and Family:   . Frequency of Social Gatherings with Friends and Family:   . Attends Religious Services:   . Active Member of Clubs or Organizations:   . Attends Banker Meetings:   [name] Marital Status:   Intimate Partner Violence:   . Fear of Current or  Ex-Partner:   . Emotionally Abused:   [name] Physically Abused:   . Sexually Abused:    Review of Systems  Constitutional: Negative for chills and fever.  HENT: Negative.   Eyes: Negative.   Respiratory: Negative.   Cardiovascular: Negative.   Gastrointestinal: Negative.   Genitourinary: Negative.   Musculoskeletal: Positive for back pain and joint pain.  Skin: Negative.   Neurological: Negative.   Psychiatric/Behavioral: Negative.     Physical Exam: Blood pressure (!) 102/48, pulse 87, temperature 97.7 F (36.5 C), temperature source Temporal, resp. rate 18, last menstrual period 10/08/2019, SpO2 99 %. Physical Exam Constitutional:      Appearance: Normal appearance.  HENT:     Mouth/Throat:     Mouth: Mucous membranes are moist.  Eyes:     Pupils: Pupils are equal, round, and reactive to light.  Cardiovascular:     Rate and Rhythm: Normal rate and regular rhythm.     Pulses: Normal pulses.  Pulmonary:     Effort: Pulmonary effort is normal.  Abdominal:     General: Abdomen is flat. Bowel sounds are normal.  Musculoskeletal:        General: Normal range of motion.  Skin:    General: Skin is warm.  Neurological:     General: No focal deficit present.     Mental Status: She is alert. Mental status is at baseline.  Psychiatric:        Mood and Affect: Mood normal.        Thought Content: Thought content normal.        Judgment: Judgment normal.      Lab results: Results for orders placed or performed during the hospital encounter of 10/23/19 (from the past 24 hour(s))  Comprehensive metabolic panel     Status: Abnormal   Collection Time: 10/23/19 12:40 PM  Result Value Ref Range   Sodium 141 135 - 145 mmol/L   Potassium 3.9 3.5 - 5.1 mmol/L   Chloride 109 98 - 111 mmol/L   CO2 26 22 - 32 mmol/L   Glucose, Bld 107 (H) 70 - 99 mg/dL   BUN 8 6 - 20 mg/dL   Creatinine, Ser 6.76 0.44 - 1.00 mg/dL   Calcium 9.1 8.9 - 72.0 mg/dL   Total Protein 7.9 6.5 - 8.1 g/dL    Albumin 4.6 3.5 - 5.0 g/dL   AST 19 15 - 41 U/L   ALT 15 0 - 44 U/L   Alkaline Phosphatase 39 38 - 126 U/L   Total Bilirubin 1.3 (H) 0.3 - 1.2 mg/dL   GFR calc non Af Amer >60 >60 mL/min   GFR calc Af Amer >60 >60 mL/min   Anion gap 6  5 - 15    Imaging results:  No results found.   Assessment & Plan: Patient admitted to sickle cell day infusion center for management of pain crisis.  Patient is opiate tolerant Initiate IV dilaudid PCA. Settings of 0.5 mg, 10 minute lockout and 3 mg per hour IV fluids, 0.45% saline at 150 ml/hr Toradol 15 mg IV times one dose Tylenol 1000 mg by mouth times one dose Review CBC with differential, complete metabolic panel, and reticulocytes as results become available.  Pain intensity will be reevaluated in context of functioning and relationship to baseline as care progress If pain intensity remains elevated and/or sudden change in hemodynamic stability transition to inpatient services for higher level of care.    [name]  APRN, MSN, FNP-C Patient Care [facility] [address] [place], [place] [address] [phone]  10/23/2019, 2:19 PM

## 2019-10-23 NOTE — Telephone Encounter (Signed)
Patient called requesting to come to the day hospital for sickle cell pain. Patient reports bilateral leg pain rated 10/10. Reports taking Oxycodone 10 mg three hours ago. COVID-19 screening done and patient denies all symptoms and exposures. Denies fever, chest pain, nausea, vomiting, diarrhea and abdominal pain. Patient does admit to feeling light-headed. Admits to having transportation without driving self. [place], FNP notified. Patient can come to the day hospital for pain management. Patient advised and expresses an understanding.

## 2019-10-24 ENCOUNTER — Other Ambulatory Visit: Payer: Self-pay

## 2019-10-24 LAB — BASIC METABOLIC PANEL
Anion gap: 7 (ref 5–15)
BUN: 10 mg/dL (ref 6–20)
CO2: 25 mmol/L (ref 22–32)
Calcium: 8 mg/dL — ABNORMAL LOW (ref 8.9–10.3)
Chloride: 106 mmol/L (ref 98–111)
Creatinine, Ser: 0.77 mg/dL (ref 0.44–1.00)
GFR calc Af Amer: >60 mL/min (ref >60)
GFR calc non Af Amer: >60 mL/min (ref >60)
Glucose, Bld: 91 mg/dL (ref 70–99)
Potassium: 3.9 mmol/L (ref 3.5–5.1)
Sodium: 138 mmol/L (ref 135–145)

## 2019-10-24 LAB — URINALYSIS, ROUTINE W REFLEX MICROSCOPIC
Bacteria, UA: NONE SEEN
Bilirubin Urine: NEGATIVE
Glucose, UA: NEGATIVE mg/dL
Hgb urine dipstick: NEGATIVE
Ketones, ur: NEGATIVE mg/dL
Nitrite: NEGATIVE
Protein, ur: NEGATIVE mg/dL
Specific Gravity, Urine: 1.016 (ref 1.005–1.030)
pH: 5 (ref 5.0–8.0)

## 2019-10-24 LAB — CBC
HCT: 25.3 % — ABNORMAL LOW (ref 36.0–46.0)
Hemoglobin: 9.4 g/dL — ABNORMAL LOW (ref 12.0–15.0)
MCH: 32 pg (ref 26.0–34.0)
MCHC: 37.2 g/dL — ABNORMAL HIGH (ref 30.0–36.0)
MCV: 86.1 fL (ref 80.0–100.0)
Platelets: 323 10*3/uL (ref 150–400)
RBC: 2.94 MIL/uL — ABNORMAL LOW (ref 3.87–5.11)
RDW: 14 % (ref 11.5–15.5)
WBC: 11.2 10*3/uL — ABNORMAL HIGH (ref 4.0–10.5)
nRBC: 0.3 % — ABNORMAL HIGH (ref 0.0–0.2)

## 2019-10-24 MED ORDER — OXYCODONE HCL 5 MG PO TABS
10.0000 mg | ORAL_TABLET | Freq: Four times a day (QID) | ORAL | Status: DC | PRN
  Administered 2019-10-24 – 2019-10-25 (×4): 10 mg via ORAL
  Filled 2019-10-24 (×4): qty 2

## 2019-10-24 MED ORDER — HYDROMORPHONE 1 MG/ML IV SOLN
INTRAVENOUS | Status: DC
  Administered 2019-10-24: 2.5 mg via INTRAVENOUS
  Administered 2019-10-24: 3.5 mg via INTRAVENOUS
  Administered 2019-10-24: 1.5 mg via INTRAVENOUS
  Administered 2019-10-25: 3 mg via INTRAVENOUS
  Administered 2019-10-25: 30 mg via INTRAVENOUS
  Administered 2019-10-25: 2.5 mg via INTRAVENOUS
  Administered 2019-10-25: 4 mg via INTRAVENOUS
  Filled 2019-10-24: qty 30

## 2019-10-24 NOTE — Progress Notes (Signed)
Patient iv becoming constantly occluded- unable to flush- iv team is coming at shift change to move site. This nurse did not see appropriate vein to stick,

## 2019-10-24 NOTE — Progress Notes (Addendum)
Subjective: Lindsey [name] is a 49 year old female with a medical history significant for sickle cell disease, chronic pain syndrome, opiate dependence, history of opiate addiction, history of vitamin D deficiency, major depression, and anemia of chronic disease was admitted for sickle cell pain crisis.  Patient states the pain intensity has improved overnight.  She rates pain as 5 out of 10.  She says that she cannot manage at home.  Her goal is 3/10.  Current pain level.  Pain is primarily to lower extremities.  Patient has been out of bed without assistance.  Patient denies headache, shortness of breath, chest pain, urinary symptoms nausea, vomiting, or diarrhea.  Objective:  Vital signs in last 24 hours:  Vitals:   10/24/19 0516 10/24/19 0830 10/24/19 0918 10/24/19 1211  BP: 109/62  112/72   Pulse: 61  (!) 56   Resp: 16 14 17 14   Temp: 98.9 F (37.2 C)  98.9 F (37.2 C)   TempSrc:   Oral   SpO2: 98% 98% 100% 98%  Weight:      Height:        Intake/Output from previous day:   Intake/Output Summary (Last 24 hours) at 10/24/2019 1417 Last data filed at 10/24/2019 1000 Gross per 24 hour  Intake 3609.49 ml  Output 0 ml  Net 3609.49 ml    Physical Exam: General: Alert, awake, oriented x3, in no acute distress.  HEENT: [place]/AT PEERL, EOMI Neck: Trachea midline,  no masses, no thyromegal,y no JVD, no carotid bruit OROPHARYNX:  Moist, No exudate/ erythema/lesions.  Heart: Regular rate and rhythm, without murmurs, rubs, gallops, PMI non-displaced, no heaves or thrills on palpation.  Lungs: Clear to auscultation, no wheezing or rhonchi noted. No increased vocal fremitus resonant to percussion  Abdomen: Soft, nontender, nondistended, positive bowel sounds, no masses no hepatosplenomegaly noted..  Neuro: No focal neurological deficits noted cranial nerves II through XII grossly intact. DTRs 2+ bilaterally upper and lower extremities. Strength 5 out of 5 in bilateral upper and lower  extremities. Musculoskeletal: No warm swelling or erythema around joints, no spinal tenderness noted. Psychiatric: Patient alert and oriented x3, good insight and cognition, good recent to remote recall. Lymph node survey: No cervical axillary or inguinal lymphadenopathy noted.  Lab Results:  Basic Metabolic Panel:    Component Value Date/Time   NA 138 10/24/2019 0253   NA 141 10/15/2019 1013   K 3.9 10/24/2019 0253   CL 106 10/24/2019 0253   CO2 25 10/24/2019 0253   BUN 10 10/24/2019 0253   BUN 9 10/15/2019 1013   CREATININE 0.77 10/24/2019 0253   GLUCOSE 91 10/24/2019 0253   CALCIUM 8.0 (L) 10/24/2019 0253   CBC:    Component Value Date/Time   WBC 11.2 (H) 10/24/2019 0253   HGB 9.4 (L) 10/24/2019 0253   HGB 10.9 (L) 10/15/2019 1013   HCT 25.3 (L) 10/24/2019 0253   HCT 31.8 (L) 10/15/2019 1013   PLT 323 10/24/2019 0253   PLT 452 (H) 10/15/2019 1013   MCV 86.1 10/24/2019 0253   MCV 95 10/15/2019 1013   NEUTROABS 6.4 10/23/2019 1240   NEUTROABS 5.7 10/15/2019 1013   LYMPHSABS 4.2 (H) 10/23/2019 1240   LYMPHSABS 2.8 10/15/2019 1013   MONOABS 0.6 10/23/2019 1240   EOSABS 0.4 10/23/2019 1240   EOSABS 0.6 (H) 10/15/2019 1013   BASOSABS 0.1 10/23/2019 1240   BASOSABS 0.1 10/15/2019 1013    Recent Results (from the past 240 hour(s))  SARS Coronavirus 2 by RT PCR (hospital order,  performed in [facility] hospital lab) Nasopharyngeal Nasopharyngeal Swab     Status: None   Collection Time: 10/23/19  5:52 PM   Specimen: Nasopharyngeal Swab  Result Value Ref Range Status   SARS Coronavirus 2 NEGATIVE NEGATIVE Final    Comment: (NOTE) SARS-CoV-2 target nucleic acids are NOT DETECTED.  The SARS-CoV-2 RNA is generally detectable in upper and lower respiratory specimens during the acute phase of infection. The lowest concentration of SARS-CoV-2 viral copies this assay can detect is 250 copies / mL. A negative result does not preclude SARS-CoV-2 infection and should not be  used as the sole basis for treatment or other patient management decisions.  A negative result may occur with improper specimen collection / handling, submission of specimen other than nasopharyngeal swab, presence of viral mutation(s) within the areas targeted by this assay, and inadequate number of viral copies (<250 copies / mL). A negative result must be combined with clinical observations, patient history, and epidemiological information.  Fact Sheet for Patients:   BoilerBrush.com.cy  Fact Sheet for Healthcare Providers: https://pope.com/  This test is not yet approved or  cleared by the [place] FDA and has been authorized for detection and/or diagnosis of SARS-CoV-2 by FDA under an Emergency Use Authorization (EUA).  This EUA will remain in effect (meaning this test can be used) for the duration of the COVID-19 declaration under Section 564(b)(1) of the Act, 21 U.S.C. section 360bbb-3(b)(1), unless the authorization is terminated or revoked sooner.  Performed at [facility], [address], [place], [place] [address]     Studies/Results: No results found.  Medications: Scheduled Meds: . enoxaparin (LOVENOX) injection  40 mg Subcutaneous Q24H  . gabapentin  300 mg Oral TID  . HYDROmorphone   Intravenous Q4H  . ketorolac  15 mg Intravenous Q6H  . oxyCODONE  10 mg Oral Q12H  . senna-docusate  1 tablet Oral BID   Continuous Infusions: . sodium chloride 50 mL/hr at 10/24/19 1157   PRN Meds:.diphenhydrAMINE, naloxone **AND** sodium chloride flush, ondansetron (ZOFRAN) IV, oxyCODONE, polyethylene glycol  Consultants:  None  Procedures:  None  Antibiotics:  None  Assessment/Plan: Principal Problem:   Sickle cell pain crisis (HCC) Active Problems:   Leukocytosis   Chronic pain   Anemia of chronic disease   Sickle cell disease with pain crisis:  Pain improving, weaning IV dilaudid  PCA. Settings decreased to 0.5 mg, 10-minute lockout, and 2 mg/h. Toradol 15 mg IV every 6 hours for total of 5 days Oxycodone 10 mg every 6 hours as needed for severe breakthrough pain Monitor vital signs closely, reevaluate pain scale regularly, and supplemental oxygen as needed Discharge planning for 10/25/2019  Chronic pain syndrome: Continue home medications  Sickle cell anemia: Hemoglobin continues to be consistent with patient's baseline, there is no clinical indication for blood transfusion at this time.  Continue to follow CBC.  Repeat in a.m.    Code Status: Full Code Family Communication: N/A Disposition Plan: Not yet ready for discharge   Nusayba Cadenas Rennis Petty  APRN, MSN, FNP-C Patient Care [facility] [address] [place], [place] [address] [phone] If 5PM-7AM, please contact night-coverage.  10/24/2019, 2:17 PM  LOS: 1 day

## 2019-10-24 NOTE — Plan of Care (Signed)

## 2019-10-25 LAB — URINE CULTURE: Culture: <10000 — AB

## 2019-10-25 MED ORDER — OXYCODONE HCL ER 10 MG PO T12A: 10.0000 mg | EXTENDED_RELEASE_TABLET | Freq: Two times a day (BID) | ORAL | 0 refills | Status: DC

## 2019-10-25 NOTE — Discharge Instructions (Signed)
Sickle Cell Anemia, Adult  Sickle cell anemia is a condition where your red blood cells are shaped like sickles. Red blood cells carry oxygen through the body. Sickle-shaped cells do not live as long as normal red blood cells. They also clump together and block blood from flowing through the blood vessels. This prevents the body from getting enough oxygen. Sickle cell anemia causes organ damage and pain. It also increases the risk of infection. Follow these instructions at home: Medicines  Take over-the-counter and prescription medicines only as told by your doctor.  If you were prescribed an antibiotic medicine, take it as told by your doctor. Do not stop taking the antibiotic even if you start to feel better.  If you develop a fever, do not take medicines to lower the fever right away. Tell your doctor about the fever. Managing pain, stiffness, and swelling  Try these methods to help with pain: ? Use a heating pad. ? Take a warm bath. ? Distract yourself, such as by watching TV. Eating and drinking  Drink enough fluid to keep your pee (urine) clear or pale yellow. Drink more in hot weather and during exercise.  Limit or avoid alcohol.  Eat a healthy diet. Eat plenty of fruits, vegetables, whole grains, and lean protein.  Take vitamins and supplements as told by your doctor. Traveling  When traveling, keep these with you: ? Your medical information. ? The names of your doctors. ? Your medicines.  If you need to take an airplane, talk to your doctor first. Activity  Rest often.  Avoid exercises that make your heart beat much faster, such as jogging. General instructions  Do not use products that have nicotine or tobacco, such as cigarettes and e-cigarettes. If you need help quitting, ask your doctor.  Consider wearing a medical alert bracelet.  Avoid being in high places (high altitudes), such as mountains.  Avoid very hot or cold temperatures.  Avoid places where the  temperature changes a lot.  Keep all follow-up visits as told by your doctor. This is important. Contact a doctor if:  A joint hurts.  Your feet or hands hurt or swell.  You feel tired (fatigued). Get help right away if:  You have symptoms of infection. These include: ? Fever. ? Chills. ? Being very tired. ? Irritability. ? Poor eating. ? Throwing up (vomiting).  You feel dizzy or faint.  You have new stomach pain, especially on the left side.  You have a an erection (priapism) that lasts more than 4 hours.  You have numbness in your arms or legs.  You have a hard time moving your arms or legs.  You have trouble talking.  You have pain that does not go away when you take medicine.  You are short of breath.  You are breathing fast.  You have a long-term cough.  You have pain in your chest.  You have a bad headache.  You have a stiff neck.  Your stomach looks bloated even though you did not eat much.  Your skin is pale.  You suddenly cannot see well. Summary  Sickle cell anemia is a condition where your red blood cells are shaped like sickles.  Follow your doctor's advice on ways to manage pain, food to eat, activities to do, and steps to take for safe travel.  Get medical help right away if you have any signs of infection, such as a fever. This information is not intended to replace advice given to you by   your health care provider. Make sure you discuss any questions you have with your health care provider. Document Revised: 08/17/2018 Document Reviewed: 05/31/2016 Elsevier Patient Education  2020 Elsevier Inc.  

## 2019-10-25 NOTE — Plan of Care (Signed)
Problem: Education: Goal: Knowledge of General Education information will improve Description Including pain rating scale, medication(s)/side effects and non-pharmacologic comfort measures Outcome: Progressing   Problem: Health Behavior/Discharge Planning: Goal: Ability to manage health-related needs will improve Outcome: Progressing   Problem: Nutrition: Goal: Adequate nutrition will be maintained Outcome: Progressing   Problem: Elimination: Goal: Will not experience complications related to urinary retention Outcome: Progressing   Problem: Pain Managment: Goal: General experience of comfort will improve Outcome: Progressing   Problem: Safety: Goal: Ability to remain free from injury will improve Outcome: Progressing   

## 2019-10-25 NOTE — Discharge Summary (Signed)
Physician Discharge Summary  Lindsey [name] GMW:[id number] DOB: 04-06-1971 DOA: 10/23/2019  PCP: Massie Maroon, FNP  Admit date: 10/23/2019  Discharge date: 10/25/2019  Discharge Diagnoses:  Principal Problem:   Sickle cell pain crisis (HCC) Active Problems:   Leukocytosis   Chronic pain   Anemia of chronic disease   Discharge Condition: Stable  Disposition:  Pt is discharged home in good condition and is to follow up with Massie Maroon, FNP this week to have labs evaluated. [id number] Fiumara is instructed to increase activity slowly and balance with rest for the next few days, and use prescribed medication to complete treatment of pain  Diet: Regular Wt Readings from Last 3 Encounters:  10/24/19 69.2 kg  10/15/19 69.2 kg  09/09/19 70.3 kg    History of present illness:  Lindsey [name] is a 49 year old female with a medical history significant for sickle cell disease, chronic pain syndrome, opiate dependence, history of opiate addiction, history of vitamin D deficiency, major depression, and anemia of chronic disease presents complaining of bilateral lower extremity and right upper extremity pain that is consistent with previous crisis.  Patient states the pain intensity increased on yesterday and has been uncontrolled by home medication.  She last had oxycodone this a.m. without sustained relief.  Pain intensity is 9/10 characterized as constant and throbbing. Patient also endorses some lightheadedness.  She denies dizziness, chest pain, shortness of breath, urinary symptoms, fever, chills, nausea, vomiting, or diarrhea.  She also denies headache or blurred vision.  No sick contacts, recent travel, or exposure to COVID-19.  She is not been vaccinated against COVID-19.  Sickle cell day infusion center course: Patient found to have vitals:BP (!) 100/55 (BP Location: Right Arm)    Pulse 68    Temp 97.7 F (36.5 C) (Temporal)    Resp 12    LMP 10/08/2019    SpO2 96%  WBCs 11.6,  hemoglobin 12.0, and platelets 420.  Bilirubin is slightly elevated at 1.3.  COVID-19 test pending.  Pain persists despite IV Dilaudid, IV Toradol, and IV fluids.  Patient admitted to MedSurg for further management of sickle cell pain crisis.  Hospital Course:  Sickle cell disease with pain crisis:   Patient was admitted for sickle cell pain crisis and managed appropriately with IVF, IV Dilaudid via PCA and IV Toradol, as well as other adjunct therapies per sickle cell pain management protocols. Patient will follow up in 1 week to repeat CBC with differential and CMP.  Patient was discharged home today in a hemodynamically stable condition.   Discharge Exam: Vitals:   10/25/19 1017 10/25/19 1126  BP: 98/66   Pulse: (!) 57   Resp: 18 14  Temp: 98.2 F (36.8 C)   SpO2: 97% 98%   Vitals:   10/25/19 0514 10/25/19 0818 10/25/19 1017 10/25/19 1126  BP: (!) 114/51  98/66   Pulse: (!) 58  (!) 57   Resp: 14 10 18 14   Temp: 98.4 F (36.9 C)  98.2 F (36.8 C)   TempSrc:      SpO2: 99% 95% 97% 98%  Weight:      Height:        General appearance : Awake, alert, not in any distress. Speech Clear. Not toxic looking HEENT: Atraumatic and Normocephalic, pupils equally reactive to light and accomodation Neck: Supple, no JVD. No cervical lymphadenopathy.  Chest: Good air entry bilaterally, no added sounds  CVS: S1 S2 regular, no murmurs.  Abdomen: Bowel sounds present, Non  tender and not distended with no gaurding, rigidity or rebound. Extremities: B/L Lower Ext shows no edema, both legs are warm to touch Neurology: Awake alert, and oriented X 3, CN II-XII intact, Non focal Skin: No Rash  Discharge Instructions  Discharge Instructions    Discharge patient   Complete by: As directed    Discharge disposition: 01-Home or Self Care   Discharge patient date: 10/25/2019     Allergies as of 10/25/2019   No Known Allergies     Medication List    TAKE these medications   ergocalciferol  1.25 MG (50000 UT) capsule Commonly known as: VITAMIN D2 Take 1 capsule (50,000 Units total) by mouth once a week.   gabapentin 300 MG capsule Commonly known as: NEURONTIN Take 1 capsule by mouth three times daily   ibuprofen 800 MG tablet Commonly known as: ADVIL TAKE 1 TABLET(800 MG) BY MOUTH EVERY 8 HOURS AS NEEDED What changed: See the new instructions.   oxyCODONE 10 mg 12 hr tablet Commonly known as: OXYCONTIN Take 1 tablet (10 mg total) by mouth every 12 (twelve) hours.   Oxycodone HCl 10 MG Tabs Take 1 tablet (10 mg total) by mouth every 6 (six) hours as needed for up to 15 days. Start taking on: October 26, 2019       The results of significant diagnostics from this hospitalization (including imaging, microbiology, ancillary and laboratory) are listed below for reference.    Significant Diagnostic Studies: No results found.  Microbiology: Recent Results (from the past 240 hour(s))  SARS Coronavirus 2 by RT PCR (hospital order, performed in [facility] hospital lab) Nasopharyngeal Nasopharyngeal Swab     Status: None   Collection Time: 10/23/19  5:52 PM   Specimen: Nasopharyngeal Swab  Result Value Ref Range Status   SARS Coronavirus 2 NEGATIVE NEGATIVE Final    Comment: (NOTE) SARS-CoV-2 target nucleic acids are NOT DETECTED.  The SARS-CoV-2 RNA is generally detectable in upper and lower respiratory specimens during the acute phase of infection. The lowest concentration of SARS-CoV-2 viral copies this assay can detect is 250 copies / mL. A negative result does not preclude SARS-CoV-2 infection and should not be used as the sole basis for treatment or other patient management decisions.  A negative result may occur with improper specimen collection / handling, submission of specimen other than nasopharyngeal swab, presence of viral mutation(s) within the areas targeted by this assay, and inadequate number of viral copies (<250 copies / mL). A negative result must  be combined with clinical observations, patient history, and epidemiological information.  Fact Sheet for Patients:   BoilerBrush.com.cy  Fact Sheet for Healthcare Providers: https://pope.com/  This test is not yet approved or  cleared by the [place] FDA and has been authorized for detection and/or diagnosis of SARS-CoV-2 by FDA under an Emergency Use Authorization (EUA).  This EUA will remain in effect (meaning this test can be used) for the duration of the COVID-19 declaration under Section 564(b)(1) of the Act, 21 U.S.C. section 360bbb-3(b)(1), unless the authorization is terminated or revoked sooner.  Performed at [facility], [address] [place], [place] [address]   Urine culture     Status: Abnormal   Collection Time: 10/24/19  2:30 AM   Specimen: Urine, Clean Catch  Result Value Ref Range Status   Specimen Description   Final    URINE, CLEAN CATCH Performed at [facility], [address] [place], [place] [address]  Special Requests   Final    NONE Performed at [facility], [place] [address], [place], [place] [address]    Culture (A)  Final    <10,000 COLONIES/mL INSIGNIFICANT GROWTH Performed at [place] [address] [place], [place] [address]    Report Status 10/25/2019 FINAL  Final     Labs: Basic Metabolic Panel: Recent Labs  Lab 10/23/19 1240 10/24/19 0253  NA 141 138  K 3.9 3.9  CL 109 106  CO2 26 25  GLUCOSE 107* 91  BUN 8 10  CREATININE 0.81 0.77  CALCIUM 9.1 8.0*   Liver Function Tests: Recent Labs  Lab 10/23/19 1240  AST 19  ALT 15  ALKPHOS 39  BILITOT 1.3*  PROT 7.9  ALBUMIN 4.6   No results for input(s): LIPASE, AMYLASE in the last 168 hours. No results for input(s): AMMONIA in the last 168 hours. CBC: Recent Labs  Lab 10/23/19 1240 10/24/19 0253  WBC 11.6* 11.2*  NEUTROABS 6.4  --   HGB 12.0 9.4*   HCT 31.6* 25.3*  MCV 86.3 86.1  PLT 420* 323   Cardiac Enzymes: No results for input(s): CKTOTAL, CKMB, CKMBINDEX, TROPONINI in the last 168 hours. BNP: Invalid input(s): POCBNP CBG: No results for input(s): GLUCAP in the last 168 hours.  Time coordinating discharge: 40 minutes  Signed:  [name]  APRN, MSN, FNP-C [facility] [address] [place], [place] [address] [phone]  [facility] 10/25/2019, 12:20 PM

## 2019-10-25 NOTE — Telephone Encounter (Signed)
Done

## 2019-11-05 ENCOUNTER — Other Ambulatory Visit: Payer: Self-pay | Admitting: Family Medicine

## 2019-11-05 ENCOUNTER — Telehealth: Payer: Self-pay | Admitting: Family Medicine

## 2019-11-05 DIAGNOSIS — G894 Chronic pain syndrome: Secondary | ICD-10-CM

## 2019-11-05 MED ORDER — OXYCODONE HCL 10 MG PO TABS: 10.0000 mg | ORAL_TABLET | Freq: Four times a day (QID) | ORAL | 0 refills | Status: DC | PRN

## 2019-11-05 NOTE — Progress Notes (Signed)
Reviewed PDMP substance reporting system prior to prescribing opiate medications. No inconsistencies noted.   Meds ordered this encounter  Medications  . Oxycodone HCl 10 MG TABS    Sig: Take 1 tablet (10 mg total) by mouth every 6 (six) hours as needed for up to 15 days.    Dispense:  60 tablet    Refill:  0    Order Specific Question:   Supervising Provider    Answer:   [name], [name]     Lindsey [name] Archie Shea  APRN, MSN, FNP-C [facility] [address]  , [place] [address] [phone]  

## 2019-11-05 NOTE — Telephone Encounter (Signed)
Pt called in refill on oxycodone 10mg  IR

## 2019-11-18 ENCOUNTER — Other Ambulatory Visit: Payer: Self-pay

## 2019-11-18 DIAGNOSIS — F17290 Nicotine dependence, other tobacco product, uncomplicated: Secondary | ICD-10-CM | POA: Diagnosis present

## 2019-11-18 DIAGNOSIS — Z79891 Long term (current) use of opiate analgesic: Secondary | ICD-10-CM

## 2019-11-18 DIAGNOSIS — Z20822 Contact with and (suspected) exposure to covid-19: Secondary | ICD-10-CM | POA: Diagnosis present

## 2019-11-18 DIAGNOSIS — G894 Chronic pain syndrome: Secondary | ICD-10-CM | POA: Diagnosis present

## 2019-11-18 DIAGNOSIS — D5700 Hb-SS disease with crisis, unspecified: Principal | ICD-10-CM | POA: Diagnosis present

## 2019-11-18 DIAGNOSIS — Z79899 Other long term (current) drug therapy: Secondary | ICD-10-CM

## 2019-11-18 LAB — CBC WITH DIFFERENTIAL/PLATELET
Abs Immature Granulocytes: 0 10*3/uL (ref 0.00–0.07)
Basophils Absolute: 0 10*3/uL (ref 0.0–0.1)
Basophils Relative: 0 %
Eosinophils Absolute: 0.7 10*3/uL — ABNORMAL HIGH (ref 0.0–0.5)
Eosinophils Relative: 5 %
HCT: 32.2 % — ABNORMAL LOW (ref 36.0–46.0)
Hemoglobin: 11.9 g/dL — ABNORMAL LOW (ref 12.0–15.0)
Immature Granulocytes: 0 %
Lymphocytes Relative: 39 %
Lymphs Abs: 4.2 10*3/uL — ABNORMAL HIGH (ref 0.7–4.0)
MCH: 32.1 pg (ref 26.0–34.0)
MCHC: 37 g/dL — ABNORMAL HIGH (ref 30.0–36.0)
MCV: 86.8 fL (ref 80.0–100.0)
Monocytes Absolute: 0.7 10*3/uL (ref 0.1–1.0)
Monocytes Relative: 9 %
Neutro Abs: 4.9 10*3/uL (ref 1.7–7.7)
Neutrophils Relative %: 47 %
Platelets: 483 10*3/uL — ABNORMAL HIGH (ref 150–400)
RBC: 3.71 MIL/uL — ABNORMAL LOW (ref 3.87–5.11)
RDW: 13.3 % (ref 11.5–15.5)
WBC: 10.5 10*3/uL (ref 4.0–10.5)
nRBC: 0 % (ref 0.0–0.2)

## 2019-11-18 LAB — COMPREHENSIVE METABOLIC PANEL
ALT: 11 U/L (ref 0–44)
AST: 15 U/L (ref 15–41)
Albumin: 4.6 g/dL (ref 3.5–5.0)
Alkaline Phosphatase: 38 U/L (ref 38–126)
Anion gap: 9 (ref 5–15)
BUN: 8 mg/dL (ref 6–20)
CO2: 26 mmol/L (ref 22–32)
Calcium: 9.3 mg/dL (ref 8.9–10.3)
Chloride: 108 mmol/L (ref 98–111)
Creatinine, Ser: 0.92 mg/dL (ref 0.44–1.00)
GFR calc Af Amer: >60 mL/min (ref >60)
GFR calc non Af Amer: >60 mL/min (ref >60)
Glucose, Bld: 98 mg/dL (ref 70–99)
Potassium: 4.1 mmol/L (ref 3.5–5.1)
Sodium: 143 mmol/L (ref 135–145)
Total Bilirubin: 1 mg/dL (ref 0.3–1.2)
Total Protein: 7.8 g/dL (ref 6.5–8.1)

## 2019-11-18 LAB — RETICULOCYTES
Immature Retic Fract: 23.1 % — ABNORMAL HIGH (ref 2.3–15.9)
RBC.: 3.55 MIL/uL — ABNORMAL LOW (ref 3.87–5.11)
Retic Count, Absolute: 112.9 10*3/uL (ref 19.0–186.0)
Retic Ct Pct: 3.2 % — ABNORMAL HIGH (ref 0.4–3.1)

## 2019-11-18 LAB — I-STAT BETA HCG BLOOD, ED (MC, WL, AP ONLY): I-stat hCG, quantitative: <5 m[IU]/mL (ref <5)

## 2019-11-18 MED ORDER — SODIUM CHLORIDE 0.9% FLUSH: 3.0000 mL | Freq: Once | INTRAVENOUS | Status: DC

## 2019-11-18 NOTE — ED Triage Notes (Signed)
Arrived POV from home. Patient reports sickle cell pain 10/10 in right leg. Patient states she had pain Saturday and was able to control it, but pain returned today much worse.

## 2019-11-19 ENCOUNTER — Encounter (HOSPITAL_COMMUNITY): Payer: Self-pay | Admitting: Internal Medicine

## 2019-11-19 ENCOUNTER — Inpatient Hospital Stay (HOSPITAL_COMMUNITY)
Admission: EM | Admit: 2019-11-19 | Discharge: 2019-11-21 | DRG: 812 | Disposition: A | Discharge status: Home / Self Care | Payer: Medicaid Other | Attending: Internal Medicine | Admitting: Internal Medicine

## 2019-11-19 DIAGNOSIS — Z20822 Contact with and (suspected) exposure to covid-19: Secondary | ICD-10-CM | POA: Diagnosis present

## 2019-11-19 DIAGNOSIS — D5700 Hb-SS disease with crisis, unspecified: Secondary | ICD-10-CM | POA: Diagnosis present

## 2019-11-19 DIAGNOSIS — D571 Sickle-cell disease without crisis: Secondary | ICD-10-CM | POA: Diagnosis present

## 2019-11-19 DIAGNOSIS — G8929 Other chronic pain: Secondary | ICD-10-CM | POA: Diagnosis present

## 2019-11-19 DIAGNOSIS — Z79891 Long term (current) use of opiate analgesic: Secondary | ICD-10-CM | POA: Diagnosis not present

## 2019-11-19 DIAGNOSIS — G894 Chronic pain syndrome: Secondary | ICD-10-CM

## 2019-11-19 DIAGNOSIS — D638 Anemia in other chronic diseases classified elsewhere: Secondary | ICD-10-CM | POA: Diagnosis present

## 2019-11-19 DIAGNOSIS — F17290 Nicotine dependence, other tobacco product, uncomplicated: Secondary | ICD-10-CM | POA: Diagnosis present

## 2019-11-19 DIAGNOSIS — Z79899 Other long term (current) drug therapy: Secondary | ICD-10-CM | POA: Diagnosis not present

## 2019-11-19 LAB — CBC
HCT: 28.8 % — ABNORMAL LOW (ref 36.0–46.0)
Hemoglobin: 10.7 g/dL — ABNORMAL LOW (ref 12.0–15.0)
MCH: 32.4 pg (ref 26.0–34.0)
MCHC: 37.2 g/dL — ABNORMAL HIGH (ref 30.0–36.0)
MCV: 87.3 fL (ref 80.0–100.0)
Platelets: 396 10*3/uL (ref 150–400)
RBC: 3.3 MIL/uL — ABNORMAL LOW (ref 3.87–5.11)
RDW: 13.6 % (ref 11.5–15.5)
WBC: 12.9 10*3/uL — ABNORMAL HIGH (ref 4.0–10.5)
nRBC: 0.2 % (ref 0.0–0.2)

## 2019-11-19 LAB — CREATININE, SERUM
Creatinine, Ser: 0.87 mg/dL (ref 0.44–1.00)
GFR calc Af Amer: >60 mL/min (ref >60)
GFR calc non Af Amer: >60 mL/min (ref >60)

## 2019-11-19 LAB — SARS CORONAVIRUS 2 BY RT PCR (HOSPITAL ORDER, PERFORMED IN ~~LOC~~ HOSPITAL LAB): SARS Coronavirus 2: NEGATIVE

## 2019-11-19 MED ORDER — HYDROMORPHONE HCL 2 MG/ML IJ SOLN
2.0000 mg | INTRAMUSCULAR | Status: AC
  Administered 2019-11-19: 2 mg via INTRAVENOUS
  Filled 2019-11-19: qty 1

## 2019-11-19 MED ORDER — NALOXONE HCL 0.4 MG/ML IJ SOLN: 0.4000 mg | INTRAMUSCULAR | Status: DC | PRN

## 2019-11-19 MED ORDER — HYDROMORPHONE 1 MG/ML IV SOLN
INTRAVENOUS | Status: DC
  Administered 2019-11-19: 30 mg via INTRAVENOUS
  Administered 2019-11-19: 2.72 mg via INTRAVENOUS
  Administered 2019-11-19: 0.68 mg via INTRAVENOUS
  Administered 2019-11-19: 2.72 mg via INTRAVENOUS
  Administered 2019-11-19: 4.76 mg via INTRAVENOUS
  Administered 2019-11-20 (×2): 1.36 mg via INTRAVENOUS
  Administered 2019-11-20: 4.08 mg via INTRAVENOUS
  Filled 2019-11-19: qty 30

## 2019-11-19 MED ORDER — SODIUM CHLORIDE 0.45 % IV SOLN: INTRAVENOUS | Status: DC

## 2019-11-19 MED ORDER — SENNOSIDES-DOCUSATE SODIUM 8.6-50 MG PO TABS
1.0000 | ORAL_TABLET | Freq: Two times a day (BID) | ORAL | Status: DC
  Administered 2019-11-19 – 2019-11-21 (×5): 1 via ORAL
  Filled 2019-11-19 (×5): qty 1

## 2019-11-19 MED ORDER — KETOROLAC TROMETHAMINE 15 MG/ML IJ SOLN
15.0000 mg | Freq: Four times a day (QID) | INTRAMUSCULAR | Status: AC
  Administered 2019-11-19 – 2019-11-20 (×5): 15 mg via INTRAVENOUS
  Filled 2019-11-19 (×5): qty 1

## 2019-11-19 MED ORDER — ENOXAPARIN SODIUM 40 MG/0.4ML ~~LOC~~ SOLN
40.0000 mg | SUBCUTANEOUS | Status: DC
  Administered 2019-11-19 – 2019-11-21 (×3): 40 mg via SUBCUTANEOUS
  Filled 2019-11-19 (×3): qty 0.4

## 2019-11-19 MED ORDER — ONDANSETRON HCL 4 MG/2ML IJ SOLN
4.0000 mg | INTRAMUSCULAR | Status: DC | PRN
  Administered 2019-11-19: 4 mg via INTRAVENOUS
  Filled 2019-11-19: qty 2

## 2019-11-19 MED ORDER — OXYCODONE HCL ER 10 MG PO T12A
10.0000 mg | EXTENDED_RELEASE_TABLET | Freq: Two times a day (BID) | ORAL | Status: DC
  Administered 2019-11-19 – 2019-11-21 (×5): 10 mg via ORAL
  Filled 2019-11-19 (×5): qty 1

## 2019-11-19 MED ORDER — MELATONIN 5 MG PO TABS
10.0000 mg | ORAL_TABLET | Freq: Every day | ORAL | Status: DC
  Administered 2019-11-19 – 2019-11-20 (×2): 10 mg via ORAL
  Filled 2019-11-19 (×2): qty 2

## 2019-11-19 MED ORDER — HYDROMORPHONE HCL 2 MG/ML IJ SOLN
2.0000 mg | INTRAMUSCULAR | Status: AC | PRN
  Administered 2019-11-19 (×3): 2 mg via INTRAVENOUS
  Filled 2019-11-19 (×3): qty 1

## 2019-11-19 MED ORDER — GABAPENTIN 300 MG PO CAPS
300.0000 mg | ORAL_CAPSULE | Freq: Three times a day (TID) | ORAL | Status: DC
  Administered 2019-11-19 – 2019-11-21 (×7): 300 mg via ORAL
  Filled 2019-11-19 (×7): qty 1

## 2019-11-19 MED ORDER — DIPHENHYDRAMINE HCL 50 MG/ML IJ SOLN
25.0000 mg | Freq: Once | INTRAMUSCULAR | Status: AC
  Administered 2019-11-19: 25 mg via INTRAVENOUS
  Filled 2019-11-19: qty 1

## 2019-11-19 MED ORDER — SODIUM CHLORIDE 0.9% FLUSH: 9.0000 mL | INTRAVENOUS | Status: DC | PRN

## 2019-11-19 MED ORDER — KETOROLAC TROMETHAMINE 15 MG/ML IJ SOLN
15.0000 mg | INTRAMUSCULAR | Status: AC
  Administered 2019-11-19: 15 mg via INTRAVENOUS
  Filled 2019-11-19: qty 1

## 2019-11-19 MED ORDER — POLYETHYLENE GLYCOL 3350 17 G PO PACK: 17.0000 g | PACK | Freq: Every day | ORAL | Status: DC | PRN

## 2019-11-19 MED ORDER — HYDROMORPHONE HCL 2 MG/ML IJ SOLN
2.0000 mg | Freq: Once | INTRAMUSCULAR | Status: AC
  Administered 2019-11-19: 2 mg via INTRAVENOUS
  Filled 2019-11-19: qty 1

## 2019-11-19 NOTE — Progress Notes (Signed)
Reported received from Emmie Niemann, ED nurse.

## 2019-11-19 NOTE — H&P (Signed)
History and Physical    Lindsey [name] BRA:[id number] DOB: Jul 02, 1970 DOA: 11/19/2019  PCP: Lindsey Maroon, FNP  Patient coming from: Home.  Chief Complaint: Left lower extremity pain.  HPI: Lindsey [name] is a 49 y.o. female with history of sickle cell anemia presents with increasing pain in the left lower extremity typical of her sickle cell pain crisis.  Denies any fever chills.  Denies any trauma.  Denies headache or shortness of breath.  ED Course: In the ER patient was having persistent pain despite pain relief medications given IV bolus.  Patient admitted for further management of sickle cell pain crisis.  Covid test was negative hemoglobin was 11.9.  On exam patient has no signs of any ischemia and no swelling of the left lower extremity.  Review of Systems: As per HPI, rest all negative.   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 She has been smoking about 0.00 packs per day. She has never used smokeless tobacco. She reports current alcohol use. She reports that she does not use drugs.  No Known Allergies  Family History  Problem Relation Age of Onset   Stroke Neg Hx        none that she knows of    Seizures Neg Hx     Prior to Admission medications   Medication Sig Start Date End Date Taking? Authorizing Provider  Aspirin-Acetaminophen-Caffeine (GOODY HEADACHE PO) Take 1 Package by mouth every 6 (six) hours as needed. Headache and pain   Yes [provider]  ergocalciferol (VITAMIN D2) 1.25 MG (50000 UT) capsule Take 1 capsule (50,000 Units total) by mouth once a week. 10/16/19  Yes Lindsey Maroon, FNP  gabapentin (NEURONTIN) 300 MG capsule Take 1 capsule by mouth  three times daily Patient taking differently: Take 300 mg by mouth 3 (three) times daily.  09/13/19  Yes Lindsey Maroon, FNP  ibuprofen (ADVIL) 800 MG tablet TAKE 1 TABLET(800 MG) BY MOUTH EVERY 8 HOURS AS NEEDED Patient taking differently: Take 800 mg by mouth every 8 (eight) hours as needed for moderate pain.  10/14/19  Yes Lindsey Maroon, FNP  Melatonin 10 MG TABS Take 10 mg by mouth at bedtime.   Yes [provider]  oxyCODONE (OXYCONTIN) 10 mg 12 hr tablet Take 1 tablet (10 mg total) by mouth every 12 (twelve) hours. 10/25/19  Yes Lindsey Maroon, FNP  Oxycodone HCl 10 MG TABS Take 1 tablet (10 mg total) by mouth every 6 (six) hours as needed for up to 15 days. Patient taking differently: Take 10 mg by mouth every 6 (six) hours as needed (pain).  11/05/19 11/20/19 Yes Lindsey Maroon, FNP    Physical Exam: Constitutional: Moderately built and nourished. Vitals:   11/19/19 0330 11/19/19 0345 11/19/19 0508 11/19/19 0631  BP: 101/66 104/64 117/73 122/77  Pulse:  74 62 67  Resp: 16 16 16  (!) 27  Temp:   98.5 F (36.9 C)   TempSrc:   Oral   SpO2: 100% 99% 100% 97%  Weight:      Height:       Eyes: Anicteric no pallor.  ENMT: No discharge from the ears eyes nose or mouth. Neck: No mass felt.  No neck rigidity. Respiratory: No rhonchi or crepitations. Cardiovascular: S1-S2 heard. Abdomen: Soft nontender bowel sound present. Musculoskeletal: No edema.  Pulses felt. Skin: No rash. Neurologic: Alert awake oriented to time place and person.  Moves all extremities. Psychiatric: Appears normal per normal affect.   Labs on Admission: I have personally reviewed following labs and imaging studies  CBC: Recent Labs  Lab 11/18/19 2046 11/19/19 0459  WBC 10.5 12.9*  NEUTROABS 4.9  --   HGB 11.9* 10.7*  HCT 32.2* 28.8*  MCV 86.8 87.3  PLT 483* 396   Basic Metabolic Panel: Recent Labs  Lab 11/18/19 2046 11/19/19 0459  NA 143  --   K 4.1  --   CL 108  --   CO2 26   --   GLUCOSE 98  --   BUN 8  --   CREATININE 0.92 0.87  CALCIUM 9.3  --    GFR: Estimated Creatinine Clearance: 69.3 mL/min (by C-G formula based on SCr of 0.87 mg/dL). Liver Function Tests: Recent Labs  Lab 11/18/19 2046  AST 15  ALT 11  ALKPHOS 38  BILITOT 1.0  PROT 7.8  ALBUMIN 4.6   No results for input(s): LIPASE, AMYLASE in the last 168 hours. No results for input(s): AMMONIA in the last 168 hours. Coagulation Profile: No results for input(s): INR, PROTIME in the last 168 hours. Cardiac Enzymes: No results for input(s): CKTOTAL, CKMB, CKMBINDEX, TROPONINI in the last 168 hours. BNP (last 3 results) No results for input(s): PROBNP in the last 8760 hours. HbA1C: No results for input(s): HGBA1C in the last 72 hours. CBG: No results for input(s): GLUCAP in the last 168 hours. Lipid Profile: No results for input(s): CHOL, HDL, LDLCALC, TRIG, CHOLHDL, LDLDIRECT in the last 72 hours. Thyroid Function Tests: No results for input(s): TSH, T4TOTAL, FREET4, T3FREE, THYROIDAB in the last 72 hours. Anemia Panel: Recent Labs    11/18/19 2046  RETICCTPCT 3.2*   Urine analysis:    Component Value Date/Time   COLORURINE YELLOW 10/24/2019 0230   APPEARANCEUR CLEAR 10/24/2019 0230   LABSPEC 1.016 10/24/2019 0230   PHURINE 5.0 10/24/2019 0230   GLUCOSEU NEGATIVE 10/24/2019 0230   HGBUR NEGATIVE 10/24/2019 0230   BILIRUBINUR NEGATIVE 10/24/2019 0230   BILIRUBINUR neg 10/15/2019 0949   KETONESUR NEGATIVE 10/24/2019 0230   PROTEINUR NEGATIVE 10/24/2019 0230   UROBILINOGEN 1.0 10/15/2019 0949   UROBILINOGEN 1.0 12/25/2012 1755   NITRITE NEGATIVE 10/24/2019 0230   LEUKOCYTESUR TRACE (A) 10/24/2019 0230   Sepsis Labs: @LABRCNTIP (procalcitonin:4,lacticidven:4) ) Recent Results (from the past 240 hour(s))  SARS Coronavirus 2 by RT PCR (hospital order, performed in [facility] hospital lab) Nasopharyngeal Nasopharyngeal Swab     Status: None   Collection Time: 11/19/19  2:16  AM   Specimen: Nasopharyngeal Swab  Result Value Ref Range Status   SARS Coronavirus 2 NEGATIVE NEGATIVE Final    Comment: (NOTE) SARS-CoV-2 target nucleic acids are NOT DETECTED.  The SARS-CoV-2 RNA is generally detectable in upper and lower respiratory specimens during the acute phase of infection. The lowest concentration of SARS-CoV-2 viral copies this assay can detect is 250 copies / mL. A negative result does not preclude SARS-CoV-2 infection and should not be used as the sole basis for treatment or other patient management decisions.  A negative result may occur with improper specimen collection / handling, submission of specimen other than nasopharyngeal swab, presence of viral  mutation(s) within the areas targeted by this assay, and inadequate number of viral copies (<250 copies / mL). A negative result must be combined with clinical observations, patient history, and epidemiological information.  Fact Sheet for Patients:   BoilerBrush.com.cy  Fact Sheet for Healthcare Providers: https://pope.com/  This test is not yet approved or  cleared by the [place] FDA and has been authorized for detection and/or diagnosis of SARS-CoV-2 by FDA under an Emergency Use Authorization (EUA).  This EUA will remain in effect (meaning this test can be used) for the duration of the COVID-19 declaration under Section 564(b)(1) of the Act, 21 U.S.C. section 360bbb-3(b)(1), unless the authorization is terminated or revoked sooner.  Performed at [facility], [address] [place], [place] [address]      Radiological Exams on Admission: No results found.   Assessment/Plan Principal Problem:   Sickle cell crisis (HCC) Active Problems:   Sickle cell pain crisis (HCC)   Anemia of chronic disease    1. Sickle cell pain crisis for which I have started patient on weight-based IV Dilaudid PCA continue OxyContin.   Gabapentin.  IV fluids.  Toradol. 2. Sickle cell anemia hemoglobin appears to be at baseline follow CBC.  Since patient is requiring IV Dilaudid PCA for pain relief medication will need close monitoring and inpatient status.   DVT prophylaxis: Lovenox. Code Status: Full code. Family Communication: Discussed with patient. Disposition Plan: Home. Consults called: None. Admission status: Inpatient.   Eduard Clos MD [facility] Pager [phone].  If 7PM-7AM, please contact night-coverage [email or web address] Password [email or web address]  11/19/2019, 6:44 AM

## 2019-11-19 NOTE — ED Provider Notes (Signed)
[facility]-EMERGENCY DEPT Provider Note   CSN: [id number] Arrival date & time: 11/18/19  1940     History Chief Complaint  Patient presents with  . Sickle Cell Pain Crisis    Lindsey [name] is a 49 y.o. female.  Lindsey [name] is a 49 y.o. female with a history of sickle cell anemia, who presents to the emergency department for evaluation of right leg pain.  She states this is consistent with her previous sickle cell crises.  She reports pain is a 10 out of 10.  She reports it is a constant throbbing ache throughout her right leg.  She states pain initially began on Saturday, but she had been able to control pain with her home oxycodone.  She reports this morning when she woke up pain was significantly worse and she could not get pain under control.  She denies any trauma or injury to the leg.  She has not noticed any swelling, redness or discoloration.  No numbness or weakness.  No associated chest pain or shortness of breath.  No fevers or chills.  Again patient states this feels exactly like her previous sickle cell crisis pain.  Patient states she thinks this crisis may have been triggered after she was out in the heat for most of the day Friday and Saturday.  Denies any abdominal pain, nausea, vomiting or diarrhea.  Thinks she may have gotten dehydrated.  On chart review it shows that patient has to be admitted for crisis pain management most times when she presents to the emergency department.        Past Medical History:  Diagnosis Date  . 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 Opioid dependence in remission (HCC)   . Sickle cell crisis [facility])     Patient Active Problem List   Diagnosis Date Noted  . Sickle cell anemia with crisis (HCC) 09/10/2019  . Anemia of chronic disease 09/10/2019  . Current mild episode of major depressive disorder (HCC)   . Sickle cell crisis (HCC) 02/28/2019  . Opioid dependence in remission (HCC) 10/01/2017  . Sickle cell  disease (HCC) 10/01/2017  . Chronic pain 10/01/2017  . 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 Leukocytosis 09/03/2016  . Sickle cell pain crisis (HCC) 07/20/2012  . 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 APPENDECTOMY    . CESAREAN SECTION    . OTHER SURGICAL HISTORY     c-section     OB History   No obstetric history on file.     Family History  Problem Relation Age of Onset  . Stroke Neg Hx        none that she knows of   . Seizures Neg Hx     Social History   Tobacco Use  . Smoking status: Current Some Day Smoker    Packs/day: 0.00    Types: Cigars  . Smokeless tobacco: Never Used  Vaping Use  . Vaping Use: Former  Substance Use Topics  . Alcohol use: Yes    Comment: occasionally  . Drug use: No    Home Medications Prior to Admission medications   Medication Sig Start Date End Date Taking? Authorizing Provider  ergocalciferol (VITAMIN D2) 1.25 MG (50000 UT) capsule Take 1 capsule (50,000 Units total) by mouth once a week. 10/16/19   Massie Maroon, FNP  gabapentin (NEURONTIN) 300 MG capsule Take 1 capsule by mouth three times daily 09/13/19  Massie Maroon, FNP  ibuprofen (ADVIL) 800 MG tablet TAKE 1 TABLET(800 MG) BY MOUTH EVERY 8 HOURS AS NEEDED Patient taking differently: Take 800 mg by mouth every 8 (eight) hours as needed for moderate pain.  10/14/19   Massie Maroon, FNP  oxyCODONE (OXYCONTIN) 10 mg 12 hr tablet Take 1 tablet (10 mg total) by mouth every 12 (twelve) hours. 10/25/19   Massie Maroon, FNP  Oxycodone HCl 10 MG TABS Take 1 tablet (10 mg total) by mouth every 6 (six) hours as needed for up to 15 days. 11/05/19 11/20/19  Massie Maroon, FNP    Allergies    Patient has no known allergies.  Review of Systems   Review of Systems  Constitutional: Negative for chills and fever.  HENT: Negative.   Respiratory: Negative for cough and shortness of breath.     Cardiovascular: Negative for chest pain and leg swelling.  Gastrointestinal: Negative for abdominal pain, diarrhea, nausea and vomiting.  Genitourinary: Negative for dysuria and frequency.  Musculoskeletal: Positive for arthralgias and myalgias. Negative for joint swelling.  Skin: Negative for color change, rash and wound.  Neurological: Negative for dizziness, syncope, weakness and numbness.    Physical Exam Updated Vital Signs BP (!) 129/95 (BP Location: Right Arm)   Pulse 60   Temp 98.8 F (37.1 C) (Oral)   Resp 16   Ht 5' (1.524 m)   Wt 68.9 kg   SpO2 100%   BMI 29.69 kg/m   Physical Exam Vitals and nursing note reviewed.  Constitutional:      General: She is in acute distress.     Appearance: Normal appearance. She is well-developed. She is not diaphoretic.     Comments: Patient is tearful and appears to be very uncomfortable and in pain and in some distress  HENT:     Head: Normocephalic and atraumatic.  Eyes:     General:        Right eye: No discharge.        Left eye: No discharge.  Cardiovascular:     Rate and Rhythm: Normal rate and regular rhythm.     Pulses:          Dorsalis pedis pulses are detected w/ Doppler on the right side and detected w/ Doppler on the left side.     Heart sounds: Normal heart sounds. No murmur heard.  No friction rub. No gallop.   Pulmonary:     Effort: Pulmonary effort is normal. No respiratory distress.     Breath sounds: Normal breath sounds. No wheezing or rales.     Comments: Respirations equal and unlabored, patient able to speak in full sentences, lungs clear to auscultation bilaterally Abdominal:     General: Bowel sounds are normal. There is no distension.     Palpations: Abdomen is soft. There is no mass.     Tenderness: There is no abdominal tenderness. There is no guarding.     Comments: Abdomen soft, nondistended, nontender to palpation in all quadrants without guarding or peritoneal signs  Musculoskeletal:         General: No deformity.     Cervical back: Neck supple.     Comments: Pain is not reproducible with palpation and patient has full range of motion of the right lower extremity, no edema or discoloration noted, 5/5 strength and normal sensation  Skin:    General: Skin is warm and dry.     Capillary Refill: Capillary refill takes less than  2 seconds.  Neurological:     Mental Status: She is alert.     Coordination: Coordination normal.     Comments: Speech is clear, able to follow commands Moves extremities without ataxia, coordination intact  Psychiatric:        Mood and Affect: Mood normal.        Behavior: Behavior normal.     ED Results / Procedures / Treatments   Labs (all labs ordered are listed, but only abnormal results are displayed) Labs Reviewed  CBC WITH DIFFERENTIAL/PLATELET - Abnormal; Notable for the following components:      Result Value   RBC 3.71 (*)    Hemoglobin 11.9 (*)    HCT 32.2 (*)    MCHC 37.0 (*)    Platelets 483 (*)    Lymphs Abs 4.2 (*)    Eosinophils Absolute 0.7 (*)    All other components within normal limits  RETICULOCYTES - Abnormal; Notable for the following components:   Retic Ct Pct 3.2 (*)    RBC. 3.55 (*)    Immature Retic Fract 23.1 (*)    All other components within normal limits  SARS CORONAVIRUS 2 BY RT PCR (HOSPITAL ORDER, PERFORMED IN [facility])  COMPREHENSIVE METABOLIC PANEL  I-STAT BETA HCG BLOOD, ED ([facility], [facility], [facility] ONLY)    EKG None  Radiology No results found.  Procedures Procedures (including critical care time)  Medications Ordered in ED Medications  sodium chloride flush (NS) 0.9 % injection 3 mL (has no administration in time range)  0.45 % sodium chloride infusion ( Intravenous New Bag/Given 11/19/19 0111)  ondansetron (ZOFRAN) injection 4 mg (4 mg Intravenous Given 11/19/19 0114)  HYDROmorphone (DILAUDID) injection 2 mg (has no administration in time range)  ketorolac (TORADOL) 15 MG/ML injection 15  mg (15 mg Intravenous Given 11/19/19 0118)  HYDROmorphone (DILAUDID) injection 2 mg (2 mg Intravenous Given 11/19/19 0111)  HYDROmorphone (DILAUDID) injection 2 mg (2 mg Intravenous Given 11/19/19 0145)  diphenhydrAMINE (BENADRYL) injection 25 mg (25 mg Intravenous Given 11/19/19 0116)    ED Course  I have reviewed the triage vital signs and the nursing notes.  Pertinent labs & imaging results that were available during my care of the patient were reviewed by me and considered in my medical decision making (see chart for details).    MDM Rules/Calculators/A&P                          49 year old female presents with sickle cell pain crisis with pain in the right lower extremity which is typical for her sickle cell crisis.  No erythema, edema or deformity to the leg.  No associated chest pain, shortness of breath or fever.  No concern for acute chest.  Her lab work appears to be at baseline.  Patient given IV fluids and IV pain medication.  After multiple doses of pain medication patient reports slight improvement in pain but still feels that she is unable to manage this at home with her home pain medications, she still appears to be quite uncomfortable will order additional IV pain medication and consult for admission.   Case discussed with Dr. Toniann FailKakrakandy with [facility] who will see and admit the patient for further management of sickle cell pain crisis.  Final Clinical Impression(s) / ED Diagnoses Final diagnoses:  Sickle cell pain crisis (HCC)    Rx / DC Orders ED Discharge Orders    None  Dartha Lodge, PA-C 11/19/19 3382    Devoria Albe, MD 11/19/19 [phone]

## 2019-11-19 NOTE — Progress Notes (Addendum)
[name] Nofsinger is a 49 year old female with a medical history significant for sickle cell disease, chronic pain syndrome, opiate dependence and tolerance, and history of anemia of chronic disease was admitted overnight in sickle cell pain crisis.  Patient continues to have increased pain primarily to upper and lower extremities.  Pain intensity is 6/10 characterized as intermittent and throbbing.  Patient denies headache, chest pain, urinary symptoms, nausea, vomiting, or diarrhea.  Care plan: No changes to care plan at this time.  Will reassess in a.m.  [name]  APRN, MSN, FNP-C [facility] [address] [place], [place] [address] [phone]

## 2019-11-20 DIAGNOSIS — D638 Anemia in other chronic diseases classified elsewhere: Secondary | ICD-10-CM

## 2019-11-20 MED ORDER — OXYCODONE HCL 5 MG PO TABS
10.0000 mg | ORAL_TABLET | ORAL | Status: DC | PRN
  Administered 2019-11-20 – 2019-11-21 (×4): 10 mg via ORAL
  Filled 2019-11-20 (×5): qty 2

## 2019-11-20 MED ORDER — KETOROLAC TROMETHAMINE 15 MG/ML IJ SOLN
15.0000 mg | Freq: Four times a day (QID) | INTRAMUSCULAR | Status: DC
  Administered 2019-11-20 – 2019-11-21 (×4): 15 mg via INTRAVENOUS
  Filled 2019-11-20 (×4): qty 1

## 2019-11-20 MED ORDER — HYDROMORPHONE 1 MG/ML IV SOLN
INTRAVENOUS | Status: DC
  Administered 2019-11-20: 30 mg via INTRAVENOUS
  Administered 2019-11-20: 2 mg via INTRAVENOUS
  Administered 2019-11-20: 2.5 mg via INTRAVENOUS
  Administered 2019-11-20: 4.5 mg via INTRAVENOUS
  Administered 2019-11-21: 2 mg via INTRAVENOUS
  Administered 2019-11-21 (×2): 2.5 mg via INTRAVENOUS
  Filled 2019-11-20: qty 30

## 2019-11-20 NOTE — Progress Notes (Signed)
Subjective: Lindsey [name] is a 49 year old female with a medical history significant for sickle cell disease, chronic pain syndrome, opiate dependence and tolerance, and history of anemia of chronic disease was admitted in sickle cell pain crisis. . Patient states that pain has improved some overnight.  Pain is primarily to upper and lower extremities.  Pain intensity is 5/10 characterized as intermittent and throbbing. Patient denies headache, shortness of breath, chest pain, urinary symptoms, nausea, vomiting, or diarrhea.  Objective:  Vital signs in last 24 hours:  Vitals:   11/20/19 1341 11/20/19 1537 11/20/19 1905 11/20/19 2024  BP: 119/70  109/78   Pulse: 72  65   Resp: (!) 9 16 11 12   Temp: 98.9 F (37.2 C)  98.8 F (37.1 C)   TempSrc: Oral  Oral   SpO2: 94%  98% 100%  Weight:      Height:        Intake/Output from previous day:   Intake/Output Summary (Last 24 hours) at 11/20/2019 2110 Last data filed at 11/20/2019 0600 Gross per 24 hour  Intake 476.43 ml  Output --  Net 476.43 ml    Physical Exam: General: Alert, awake, oriented x3, in no acute distress.  HEENT: [place]/AT [place]ER[place], EOMI Neck: Trachea midline,  no masses, no thyromegal,y no JVD, no carotid bruit OROPHARYNX:  Moist, No exudate/ erythema/lesions.  Heart: Regular rate and rhythm, without murmurs, rubs, gallops, PMI non-displaced, no heaves or thrills on palpation.  Lungs: Clear to auscultation, no wheezing or rhonchi noted. No increased vocal fremitus resonant to percussion  Abdomen: Soft, nontender, nondistended, positive bowel sounds, no masses no hepatosplenomegaly noted..  Neuro: No focal neurological deficits noted cranial nerves II through XII grossly intact. DTRs 2+ bilaterally upper and lower extremities. Strength 5 out of 5 in bilateral upper and lower extremities. Musculoskeletal: No warm swelling or erythema around joints, no spinal tenderness noted. Psychiatric: Patient alert and oriented x3, good  insight and cognition, good recent to remote recall. Lymph node survey: No cervical axillary or inguinal lymphadenopathy noted.  Lab Results:  Basic Metabolic Panel:    Component Value Date/Time   NA 143 11/18/2019 2046   NA 141 10/15/2019 1013   K 4.1 11/18/2019 2046   CL 108 11/18/2019 2046   CO2 26 11/18/2019 2046   BUN 8 11/18/2019 2046   BUN 9 10/15/2019 1013   CREATININE 0.87 11/19/2019 0459   GLUCOSE 98 11/18/2019 2046   CALCIUM 9.3 11/18/2019 2046   CBC:    Component Value Date/Time   WBC 12.9 (H) 11/19/2019 0459   HGB 10.7 (L) 11/19/2019 0459   HGB 10.9 (L) 10/15/2019 1013   HCT 28.8 (L) 11/19/2019 0459   HCT 31.8 (L) 10/15/2019 1013   PLT 396 11/19/2019 0459   PLT 452 (H) 10/15/2019 1013   MCV 87.3 11/19/2019 0459   MCV 95 10/15/2019 1013   NEUTROABS 4.9 11/18/2019 2046   NEUTROABS 5.7 10/15/2019 1013   LYMPHSABS 4.2 (H) 11/18/2019 2046   LYMPHSABS 2.8 10/15/2019 1013   MONOABS 0.7 11/18/2019 2046   EOSABS 0.7 (H) 11/18/2019 2046   EOSABS 0.6 (H) 10/15/2019 1013   BASOSABS 0.0 11/18/2019 2046   BASOSABS 0.1 10/15/2019 1013    Recent Results (from the past 240 hour(s))  SARS Coronavirus 2 by RT PCR (hospital order, performed in [facility] hospital lab) Nasopharyngeal Nasopharyngeal Swab     Status: None   Collection Time: 11/19/19  2:16 AM   Specimen: Nasopharyngeal Swab  Result Value Ref Range  Status   SARS Coronavirus 2 NEGATIVE NEGATIVE Final    Comment: (NOTE) SARS-CoV-2 target nucleic acids are NOT DETECTED.  The SARS-CoV-2 RNA is generally detectable in upper and lower respiratory specimens during the acute phase of infection. The lowest concentration of SARS-CoV-2 viral copies this assay can detect is 250 copies / mL. A negative result does not preclude SARS-CoV-2 infection and should not be used as the sole basis for treatment or other patient management decisions.  A negative result may occur with improper specimen collection / handling,  submission of specimen other than nasopharyngeal swab, presence of viral mutation(s) within the areas targeted by this assay, and inadequate number of viral copies (<250 copies / mL). A negative result must be combined with clinical observations, patient history, and epidemiological information.  Fact Sheet for Patients:   BoilerBrush.com.cy  Fact Sheet for Healthcare Providers: https://pope.com/  This test is not yet approved or  cleared by the [place] FDA and has been authorized for detection and/or diagnosis of SARS-CoV-2 by FDA under an Emergency Use Authorization (EUA).  This EUA will remain in effect (meaning this test can be used) for the duration of the COVID-19 declaration under Section 564(b)(1) of the Act, 21 U.S.C. section 360bbb-3(b)(1), unless the authorization is terminated or revoked sooner.  Performed at [facility], [address] [place], [place] [address]     Studies/Results: No results found.  Medications: Scheduled Meds:  enoxaparin (LOVENOX) injection  40 mg Subcutaneous Q24H   gabapentin  300 mg Oral TID   HYDROmorphone   Intravenous Q4H   ketorolac  15 mg Intravenous Q6H   melatonin  10 mg Oral QHS   oxyCODONE  10 mg Oral Q12H   senna-docusate  1 tablet Oral BID   sodium chloride flush  3 mL Intravenous Once   Continuous Infusions:  sodium chloride 10 mL/hr at 11/20/19 1536   PRN Meds:.naloxone **AND** sodium chloride flush, ondansetron, oxyCODONE, polyethylene glycol  Consultants:  None  Procedures:  None  Antibiotics:  None  Assessment/Plan: Principal Problem:   Sickle cell crisis (HCC) Active Problems:   Sickle cell pain crisis (HCC)   Anemia of chronic disease  Sickle cell disease with pain crisis: Weaning IV Dilaudid PCA Oxycodone 10 mg every 4 hours as needed for severe breakthrough pain Reduce IV fluids to [facility] Monitor vital signs  closely, reevaluate pain scale regularly, and supplemental oxygen as needed  Sickle cell anemia: Hemoglobin is stable and consistent with patient's baseline.  There is no clinical indication for blood transfusion at this time.  Continue to follow CBC.  Chronic pain syndrome: Continue home medications Code Status: Full Code Family Communication: N/A Disposition Plan: Not yet ready for discharge   Shianna Bally Rennis Petty  APRN, MSN, FNP-C [facility] [address] [place], [place] [address] [phone]  If 5PM-8AM, please contact night-coverage.  11/20/2019, 9:10 PM  LOS: 1 day

## 2019-11-21 LAB — CBC
HCT: 24.4 % — ABNORMAL LOW (ref 36.0–46.0)
Hemoglobin: 9.1 g/dL — ABNORMAL LOW (ref 12.0–15.0)
MCH: 32.4 pg (ref 26.0–34.0)
MCHC: 37.3 g/dL — ABNORMAL HIGH (ref 30.0–36.0)
MCV: 86.8 fL (ref 80.0–100.0)
Platelets: 281 10*3/uL (ref 150–400)
RBC: 2.81 MIL/uL — ABNORMAL LOW (ref 3.87–5.11)
RDW: 13.6 % (ref 11.5–15.5)
WBC: 10.5 10*3/uL (ref 4.0–10.5)
nRBC: 0.3 % — ABNORMAL HIGH (ref 0.0–0.2)

## 2019-11-21 MED ORDER — OXYCODONE HCL 10 MG PO TABS: 10.0000 mg | ORAL_TABLET | Freq: Four times a day (QID) | ORAL | 0 refills | Status: DC | PRN

## 2019-11-21 MED ORDER — OXYCODONE HCL ER 10 MG PO T12A: 10.0000 mg | EXTENDED_RELEASE_TABLET | Freq: Two times a day (BID) | ORAL | 0 refills | Status: DC

## 2019-11-21 NOTE — Discharge Summary (Signed)
Physician Discharge Summary  Lindsey [name] TGG:[id number] DOB: October 21, 1970 DOA: 11/19/2019  PCP: Massie Maroon, FNP  Admit date: 11/19/2019  Discharge date: 11/22/2019  Discharge Diagnoses:  Principal Problem:   Sickle cell crisis (HCC) Active Problems:   Sickle cell pain crisis (HCC)   Chronic pain   Anemia of chronic disease   Discharge Condition: Stable  Disposition:   Follow-up Information    Massie Maroon, FNP Follow up in 1 week(s).   Specialty: Family Medicine Contact information: [address] [place] [address] [phone]              Pt is discharged home in good condition and is to follow up with Massie Maroon, FNP this week to have labs evaluated. [id number] Demonbreun is instructed to increase activity slowly and balance with rest for the next few days, and use prescribed medication to complete treatment of pain  Diet: Regular Wt Readings from Last 3 Encounters:  11/19/19 68.6 kg  10/24/19 69.2 kg  10/15/19 69.2 kg    History of present illness:  Lindsey [name] is a 49 year old female with a medical history significant for sickle cell disease, chronic pain syndrome, opiate dependence and tolerance, and history of anemia of chronic disease presented to the emergency department with increasing pain in the left lower extremity. Pain is consistent with her typical sickle cell pain crisis. Patient denies any fever, chills, trauma, headache, or shortness of breath. Patient was taking home medications consistently without any relief.  ED course: In the ER, patient was having persistent pain despite pain relief medications given via IV bolus. Patient admitted for further management of sickle cell pain crisis. COVID-19 test negative. Hemoglobin was 11.9, consistent with patient's baseline. On exam, patient had no signs of ischemia. She had no swelling of the left lower extremity.  Hospital Course:  Sickle cell disease with pain crisis: Patient  was admitted for sickle cell pain crisis and managed appropriately with IVF, IV Dilaudid via PCA and IV Toradol, as well as other adjunct therapies per sickle cell pain management protocols. IV Dilaudid PCA was weaned appropriately. Patient was transitioned to home medications. OxyContin was continued without interruption. Patient is opiate pain medications were sent to pharmacy following review of PDMP. There were no inconsistencies noted. Patient's pain intensity decreased to 3/10, she is requesting discharge home on today. She says that she can manage at home on current medication regimen. Patient also advised to continue hydrating with around 64 ounces of fluid per day. Also, continue folic acid 1 mg daily. Follow-up in office as scheduled for medication management.  Patient is alert, oriented, and ambulating without assistance. She is afebrile and saturating well on room air. Patient is aware of all upcoming appointments.  Patient was discharged home today in a hemodynamically stable condition.   Discharge Exam: Vitals:   11/21/19 0955 11/21/19 1150  BP: 116/72   Pulse: (!) 58   Resp: 12 13  Temp: 98 F (36.7 C)   SpO2: 96% 100%   Vitals:   11/21/19 0550 11/21/19 0723 11/21/19 0955 11/21/19 1150  BP: (!) 101/56  116/72   Pulse: (!) 59  (!) 58   Resp: 16 16 12 13   Temp: 97.9 F (36.6 C)  98 F (36.7 C)   TempSrc: Oral  Oral   SpO2: 97% 100% 96% 100%  Weight:      Height:        General appearance : Awake, alert, not in any distress.  Speech Clear. Not toxic looking HEENT: Atraumatic and Normocephalic, pupils equally reactive to light and accomodation Neck: Supple, no JVD. No cervical lymphadenopathy.  Chest: Good air entry bilaterally, no added sounds  CVS: S1 S2 regular, no murmurs.  Abdomen: Bowel sounds present, Non tender and not distended with no gaurding, rigidity or rebound. Extremities: B/L Lower Ext shows no edema, both legs are warm to touch Neurology: Awake alert,  and oriented X 3, CN II-XII intact, Non focal Skin: No Rash  Discharge Instructions  Discharge Instructions    Discharge patient   Complete by: As directed    Discharge disposition: 01-Home or Self Care   Discharge patient date: 11/21/2019     Allergies as of 11/21/2019   No Known Allergies     Medication List    TAKE these medications   ergocalciferol 1.25 MG (50000 UT) capsule Commonly known as: VITAMIN D2 Take 1 capsule (50,000 Units total) by mouth once a week.   gabapentin 300 MG capsule Commonly known as: NEURONTIN Take 1 capsule by mouth three times daily What changed:   how much to take  how to take this  when to take this  additional instructions   GOODY HEADACHE PO Take 1 Package by mouth every 6 (six) hours as needed. Headache and pain   ibuprofen 800 MG tablet Commonly known as: ADVIL TAKE 1 TABLET(800 MG) BY MOUTH EVERY 8 HOURS AS NEEDED What changed: See the new instructions.   Melatonin 10 MG Tabs Take 10 mg by mouth at bedtime.   Oxycodone HCl 10 MG Tabs Take 1 tablet (10 mg total) by mouth every 6 (six) hours as needed for up to 15 days. Start taking on: November 24, 2019 What changed: These instructions start on November 24, 2019. If you are unsure what to do until then, ask your doctor or other care provider.   oxyCODONE 10 mg 12 hr tablet Commonly known as: OXYCONTIN Take 1 tablet (10 mg total) by mouth every 12 (twelve) hours. Start taking on: November 24, 2019 What changed: These instructions start on November 24, 2019. If you are unsure what to do until then, ask your doctor or other care provider.       The results of significant diagnostics from this hospitalization (including imaging, microbiology, ancillary and laboratory) are listed below for reference.    Significant Diagnostic Studies: No results found.  Microbiology: Recent Results (from the past 240 hour(s))  SARS Coronavirus 2 by RT PCR (hospital order, performed in [facility]  hospital lab) Nasopharyngeal Nasopharyngeal Swab     Status: None   Collection Time: 11/19/19  2:16 AM   Specimen: Nasopharyngeal Swab  Result Value Ref Range Status   SARS Coronavirus 2 NEGATIVE NEGATIVE Final    Comment: (NOTE) SARS-CoV-2 target nucleic acids are NOT DETECTED.  The SARS-CoV-2 RNA is generally detectable in upper and lower respiratory specimens during the acute phase of infection. The lowest concentration of SARS-CoV-2 viral copies this assay can detect is 250 copies / mL. A negative result does not preclude SARS-CoV-2 infection and should not be used as the sole basis for treatment or other patient management decisions.  A negative result may occur with improper specimen collection / handling, submission of specimen other than nasopharyngeal swab, presence of viral mutation(s) within the areas targeted by this assay, and inadequate number of viral copies (<250 copies / mL). A negative result must be combined with clinical observations, patient history, and epidemiological information.  Fact Sheet for Patients:  BoilerBrush.com.cy  Fact Sheet for Healthcare Providers: https://pope.com/  This test is not yet approved or  cleared by the [place] FDA and has been authorized for detection and/or diagnosis of SARS-CoV-2 by FDA under an Emergency Use Authorization (EUA).  This EUA will remain in effect (meaning this test can be used) for the duration of the COVID-19 declaration under Section 564(b)(1) of the Act, 21 U.S.C. section 360bbb-3(b)(1), unless the authorization is terminated or revoked sooner.  Performed at [facility], [address] [place], [place] [address]      Labs: Basic Metabolic Panel: Recent Labs  Lab 11/18/19 2046 11/19/19 0459  NA 143  --   K 4.1  --   CL 108  --   CO2 26  --   GLUCOSE 98  --   BUN 8  --   CREATININE 0.92 0.87  CALCIUM 9.3  --    Liver  Function Tests: Recent Labs  Lab 11/18/19 2046  AST 15  ALT 11  ALKPHOS 38  BILITOT 1.0  PROT 7.8  ALBUMIN 4.6   No results for input(s): LIPASE, AMYLASE in the last 168 hours. No results for input(s): AMMONIA in the last 168 hours. CBC: Recent Labs  Lab 11/18/19 2046 11/19/19 0459 11/21/19 0554  WBC 10.5 12.9* 10.5  NEUTROABS 4.9  --   --   HGB 11.9* 10.7* 9.1*  HCT 32.2* 28.8* 24.4*  MCV 86.8 87.3 86.8  PLT 483* 396 281   Cardiac Enzymes: No results for input(s): CKTOTAL, CKMB, CKMBINDEX, TROPONINI in the last 168 hours. BNP: Invalid input(s): POCBNP CBG: No results for input(s): GLUCAP in the last 168 hours.  Time coordinating discharge: 40 minutes  Signed:  [name]  APRN, MSN, FNP-C Patient Care [facility] [address] [place], [place] [address] [phone]  [facility] 11/22/2019, 2:51 PM

## 2019-11-21 NOTE — Discharge Instructions (Signed)
Sickle Cell Anemia, Adult  Sickle cell anemia is a condition where your red blood cells are shaped like sickles. Red blood cells carry oxygen through the body. Sickle-shaped cells do not live as long as normal red blood cells. They also clump together and block blood from flowing through the blood vessels. This prevents the body from getting enough oxygen. Sickle cell anemia causes organ damage and pain. It also increases the risk of infection. Follow these instructions at home: Medicines  Take over-the-counter and prescription medicines only as told by your doctor.  If you were prescribed an antibiotic medicine, take it as told by your doctor. Do not stop taking the antibiotic even if you start to feel better.  If you develop a fever, do not take medicines to lower the fever right away. Tell your doctor about the fever. Managing pain, stiffness, and swelling  Try these methods to help with pain: ? Use a heating pad. ? Take a warm bath. ? Distract yourself, such as by watching TV. Eating and drinking  Drink enough fluid to keep your pee (urine) clear or pale yellow. Drink more in hot weather and during exercise.  Limit or avoid alcohol.  Eat a healthy diet. Eat plenty of fruits, vegetables, whole grains, and lean protein.  Take vitamins and supplements as told by your doctor. Traveling  When traveling, keep these with you: ? Your medical information. ? The names of your doctors. ? Your medicines.  If you need to take an airplane, talk to your doctor first. Activity  Rest often.  Avoid exercises that make your heart beat much faster, such as jogging. General instructions  Do not use products that have nicotine or tobacco, such as cigarettes and e-cigarettes. If you need help quitting, ask your doctor.  Consider wearing a medical alert bracelet.  Avoid being in high places (high altitudes), such as mountains.  Avoid very hot or cold temperatures.  Avoid places where the  temperature changes a lot.  Keep all follow-up visits as told by your doctor. This is important. Contact a doctor if:  A joint hurts.  Your feet or hands hurt or swell.  You feel tired (fatigued). Get help right away if:  You have symptoms of infection. These include: ? Fever. ? Chills. ? Being very tired. ? Irritability. ? Poor eating. ? Throwing up (vomiting).  You feel dizzy or faint.  You have new stomach pain, especially on the left side.  You have a an erection (priapism) that lasts more than 4 hours.  You have numbness in your arms or legs.  You have a hard time moving your arms or legs.  You have trouble talking.  You have pain that does not go away when you take medicine.  You are short of breath.  You are breathing fast.  You have a long-term cough.  You have pain in your chest.  You have a bad headache.  You have a stiff neck.  Your stomach looks bloated even though you did not eat much.  Your skin is pale.  You suddenly cannot see well. Summary  Sickle cell anemia is a condition where your red blood cells are shaped like sickles.  Follow your doctor's advice on ways to manage pain, food to eat, activities to do, and steps to take for safe travel.  Get medical help right away if you have any signs of infection, such as a fever. This information is not intended to replace advice given to you by   your health care provider. Make sure you discuss any questions you have with your health care provider. Document Revised: 08/17/2018 Document Reviewed: 05/31/2016 Elsevier Patient Education  2020 Elsevier Inc.  

## 2019-11-21 NOTE — Progress Notes (Signed)
Pt discharged home with spouse in stable condition. Discharge instructions given. Scripts sent to pharmacy of choice. No immediate questions or concerns at this time. Pt discharged from unit via wheelchair.  

## 2019-11-24 ENCOUNTER — Other Ambulatory Visit: Payer: Self-pay | Admitting: Family Medicine

## 2019-11-24 DIAGNOSIS — G894 Chronic pain syndrome: Secondary | ICD-10-CM

## 2019-11-24 MED ORDER — OXYCODONE HCL 10 MG PO TABS: 10.0000 mg | ORAL_TABLET | Freq: Four times a day (QID) | ORAL | 0 refills | Status: DC | PRN

## 2019-11-26 ENCOUNTER — Telehealth: Payer: Self-pay

## 2019-11-26 NOTE — Telephone Encounter (Signed)
Received a refill request for Hydroxyzine 25mg . 1 to 2 tablets once daily at bedtime. I do not see this on current medication list. Please advise if this can be refilled?

## 2019-11-27 ENCOUNTER — Other Ambulatory Visit: Payer: Self-pay | Admitting: Family Medicine

## 2019-11-27 MED ORDER — HYDROXYZINE HCL 25 MG PO TABS
25.0000 mg | ORAL_TABLET | Freq: Every evening | ORAL | 1 refills | Status: DC | PRN
Start: 2019-11-27 — End: 2020-04-10

## 2019-11-27 NOTE — Progress Notes (Unsigned)
Meds ordered this encounter  Medications  . hydrOXYzine (ATARAX/VISTARIL) 25 MG tablet    Sig: Take 1 tablet (25 mg total) by mouth at bedtime as needed.    Dispense:  30 tablet    Refill:  1    Order Specific Question:   Supervising Provider    Answer:   Quentin Angst [[id number]]     [name]  APRN, MSN, FNP-C Patient Care [facility] [address] [place], [place] [address] [phone]

## 2019-12-06 ENCOUNTER — Other Ambulatory Visit: Payer: Self-pay | Admitting: Family Medicine

## 2019-12-06 ENCOUNTER — Telehealth: Payer: Self-pay | Admitting: Family Medicine

## 2019-12-06 DIAGNOSIS — G894 Chronic pain syndrome: Secondary | ICD-10-CM

## 2019-12-06 MED ORDER — OXYCODONE HCL 10 MG PO TABS: 10.0000 mg | ORAL_TABLET | Freq: Four times a day (QID) | ORAL | 0 refills | Status: DC | PRN

## 2019-12-06 NOTE — Progress Notes (Signed)
Reviewed PDMP substance reporting system prior to prescribing opiate medications. No inconsistencies noted.   Meds ordered this encounter  Medications  . Oxycodone HCl 10 MG TABS    Sig: Take 1 tablet (10 mg total) by mouth every 6 (six) hours as needed for up to 15 days.    Dispense:  60 tablet    Refill:  0    Order Specific Question:   Supervising Provider    Answer:   [name], [name]     Lindsey Gronewold Moore Dalani Mette  APRN, MSN, FNP-C [facility] [address]  [place], [place] [address] [phone]  

## 2019-12-09 NOTE — Telephone Encounter (Signed)
Done

## 2019-12-13 ENCOUNTER — Other Ambulatory Visit: Payer: Self-pay

## 2019-12-13 ENCOUNTER — Other Ambulatory Visit: Payer: Self-pay | Admitting: Internal Medicine

## 2019-12-13 ENCOUNTER — Other Ambulatory Visit: Payer: Self-pay | Admitting: Family Medicine

## 2019-12-13 ENCOUNTER — Other Ambulatory Visit (HOSPITAL_COMMUNITY): Payer: Self-pay | Admitting: Family Medicine

## 2019-12-13 ENCOUNTER — Telehealth: Payer: Self-pay | Admitting: Family Medicine

## 2019-12-13 DIAGNOSIS — G894 Chronic pain syndrome: Secondary | ICD-10-CM

## 2019-12-13 DIAGNOSIS — D5700 Hb-SS disease with crisis, unspecified: Secondary | ICD-10-CM

## 2019-12-13 MED ORDER — GABAPENTIN 300 MG PO CAPS: ORAL_CAPSULE | ORAL | 1 refills | Status: DC

## 2019-12-13 MED ORDER — OXYCODONE HCL 10 MG PO TABS: 10.0000 mg | ORAL_TABLET | Freq: Four times a day (QID) | ORAL | 0 refills | Status: DC | PRN

## 2019-12-13 NOTE — Telephone Encounter (Signed)
Patient currently at pharmacy. Rx called into pharmacy today for Oxycodone-IR and Gabapentin, r/t patient's complaints of increasing pain. Patient recently had Oxy-IR refilled for # 60 tablets on 12/06/2019 for a 15 day supply. Pharmacist is not willing to refill Oxycodone at this time. Informed patient in detail of situation and encouraged her to consider Rx for Gabapentin and use of other supportive pain medications to aid in pain management. Also advised patient to report to ED if pain does not improve or worsens. Patient very upset and is unconsolable at this time. Patient disconnected phone call.

## 2019-12-13 NOTE — Progress Notes (Signed)
Lindsey [name], a 49 year old female with a medical history significant for sickle cell disease, chronic pain syndrome, opiate dependence and, and history of anemia of chronic disease called with complaints of increased pain over the past several days.  Patient has been using more medication than prescribed.  Patient last filled oxycodone No. 60 on 12/06/2019.  Patient advised to report to closest ER if pain worsens.  Oxycodone 10 mg #15 sent to patient's pharmacy.  She will follow-up in sickle cell day Hospital on 12/16/2019.   [name]  APRN, MSN, FNP-C Patient Care [facility] [address] [place], [place] [address] [phone]

## 2019-12-13 NOTE — Telephone Encounter (Signed)
Refill request for oxycodone. Please advise.  

## 2019-12-15 ENCOUNTER — Encounter (HOSPITAL_COMMUNITY): Payer: Self-pay

## 2019-12-15 ENCOUNTER — Other Ambulatory Visit: Payer: Self-pay

## 2019-12-15 ENCOUNTER — Observation Stay (HOSPITAL_COMMUNITY)
Admission: EM | Admit: 2019-12-15 | Discharge: 2019-12-16 | Disposition: A | Discharge status: Home / Self Care | Payer: Medicaid Other | Attending: Internal Medicine | Admitting: Internal Medicine

## 2019-12-15 DIAGNOSIS — Z20822 Contact with and (suspected) exposure to covid-19: Secondary | ICD-10-CM | POA: Diagnosis not present

## 2019-12-15 DIAGNOSIS — G894 Chronic pain syndrome: Secondary | ICD-10-CM

## 2019-12-15 DIAGNOSIS — Z7982 Long term (current) use of aspirin: Secondary | ICD-10-CM | POA: Diagnosis not present

## 2019-12-15 DIAGNOSIS — M25512 Pain in left shoulder: Secondary | ICD-10-CM

## 2019-12-15 DIAGNOSIS — F17290 Nicotine dependence, other tobacco product, uncomplicated: Secondary | ICD-10-CM | POA: Insufficient documentation

## 2019-12-15 DIAGNOSIS — D5700 Hb-SS disease with crisis, unspecified: Secondary | ICD-10-CM | POA: Diagnosis not present

## 2019-12-15 DIAGNOSIS — M79604 Pain in right leg: Secondary | ICD-10-CM

## 2019-12-15 LAB — CBC WITH DIFFERENTIAL/PLATELET
Abs Immature Granulocytes: 0.03 10*3/uL (ref 0.00–0.07)
Basophils Absolute: 0.1 10*3/uL (ref 0.0–0.1)
Basophils Relative: 1 %
Eosinophils Absolute: 0.5 10*3/uL (ref 0.0–0.5)
Eosinophils Relative: 5 %
HCT: 30 % — ABNORMAL LOW (ref 36.0–46.0)
Hemoglobin: 11.6 g/dL — ABNORMAL LOW (ref 12.0–15.0)
Immature Granulocytes: 0 %
Lymphocytes Relative: 49 %
Lymphs Abs: 5.6 10*3/uL — ABNORMAL HIGH (ref 0.7–4.0)
MCH: 33 pg (ref 26.0–34.0)
MCHC: 38.7 g/dL — ABNORMAL HIGH (ref 30.0–36.0)
MCV: 85.2 fL (ref 80.0–100.0)
Monocytes Absolute: 0.8 10*3/uL (ref 0.1–1.0)
Monocytes Relative: 7 %
Neutro Abs: 4.3 10*3/uL (ref 1.7–7.7)
Neutrophils Relative %: 38 %
Platelets: 516 10*3/uL — ABNORMAL HIGH (ref 150–400)
RBC: 3.52 MIL/uL — ABNORMAL LOW (ref 3.87–5.11)
RDW: 13.2 % (ref 11.5–15.5)
WBC: 11.3 10*3/uL — ABNORMAL HIGH (ref 4.0–10.5)
nRBC: 0 % (ref 0.0–0.2)

## 2019-12-15 LAB — COMPREHENSIVE METABOLIC PANEL
ALT: 16 U/L (ref 0–44)
AST: 21 U/L (ref 15–41)
Albumin: 4.1 g/dL (ref 3.5–5.0)
Alkaline Phosphatase: 35 U/L — ABNORMAL LOW (ref 38–126)
Anion gap: 12 (ref 5–15)
BUN: 8 mg/dL (ref 6–20)
CO2: 22 mmol/L (ref 22–32)
Calcium: 9.4 mg/dL (ref 8.9–10.3)
Chloride: 108 mmol/L (ref 98–111)
Creatinine, Ser: 0.99 mg/dL (ref 0.44–1.00)
GFR calc Af Amer: >60 mL/min (ref >60)
GFR calc non Af Amer: >60 mL/min (ref >60)
Glucose, Bld: 100 mg/dL — ABNORMAL HIGH (ref 70–99)
Potassium: 4 mmol/L (ref 3.5–5.1)
Sodium: 142 mmol/L (ref 135–145)
Total Bilirubin: 1 mg/dL (ref 0.3–1.2)
Total Protein: 7.1 g/dL (ref 6.5–8.1)

## 2019-12-15 LAB — RETICULOCYTES
Immature Retic Fract: 20.9 % — ABNORMAL HIGH (ref 2.3–15.9)
RBC.: 3.51 MIL/uL — ABNORMAL LOW (ref 3.87–5.11)
Retic Count, Absolute: 105.7 10*3/uL (ref 19.0–186.0)
Retic Ct Pct: 3 % (ref 0.4–3.1)

## 2019-12-15 LAB — I-STAT BETA HCG BLOOD, ED (MC, WL, AP ONLY): I-stat hCG, quantitative: <5 m[IU]/mL (ref <5)

## 2019-12-15 MED ORDER — HYDROXYZINE HCL 25 MG PO TABS
25.0000 mg | ORAL_TABLET | Freq: Every day | ORAL | Status: DC
  Administered 2019-12-16: 25 mg via ORAL
  Filled 2019-12-15 (×2): qty 1

## 2019-12-15 MED ORDER — SODIUM CHLORIDE 0.9% FLUSH: 9.0000 mL | INTRAVENOUS | Status: DC | PRN

## 2019-12-15 MED ORDER — ENOXAPARIN SODIUM 40 MG/0.4ML ~~LOC~~ SOLN
40.0000 mg | SUBCUTANEOUS | Status: DC
  Filled 2019-12-15: qty 0.4

## 2019-12-15 MED ORDER — POLYETHYLENE GLYCOL 3350 17 G PO PACK: 17.0000 g | PACK | Freq: Every day | ORAL | Status: DC | PRN

## 2019-12-15 MED ORDER — HYDROMORPHONE HCL 1 MG/ML IJ SOLN
2.0000 mg | INTRAMUSCULAR | Status: AC
  Administered 2019-12-15: 2 mg via INTRAVENOUS
  Filled 2019-12-15: qty 2

## 2019-12-15 MED ORDER — OXYCODONE HCL ER 10 MG PO T12A
10.0000 mg | EXTENDED_RELEASE_TABLET | Freq: Two times a day (BID) | ORAL | Status: DC
  Administered 2019-12-15 – 2019-12-16 (×2): 10 mg via ORAL
  Filled 2019-12-15 (×2): qty 1

## 2019-12-15 MED ORDER — OXYCODONE-ACETAMINOPHEN 5-325 MG PO TABS
2.0000 | ORAL_TABLET | Freq: Once | ORAL | Status: AC
  Administered 2019-12-15: 2 via ORAL
  Filled 2019-12-15: qty 2

## 2019-12-15 MED ORDER — DIPHENHYDRAMINE HCL 25 MG PO CAPS: 25.0000 mg | ORAL_CAPSULE | ORAL | Status: DC | PRN

## 2019-12-15 MED ORDER — HYDROMORPHONE 1 MG/ML IV SOLN
INTRAVENOUS | Status: DC
  Administered 2019-12-16: 4.5 mg via INTRAVENOUS
  Administered 2019-12-16: 1 mg via INTRAVENOUS
  Administered 2019-12-16: 30 mg via INTRAVENOUS
  Administered 2019-12-16: 1.5 mg via INTRAVENOUS
  Filled 2019-12-15: qty 30

## 2019-12-15 MED ORDER — SODIUM CHLORIDE 0.9% FLUSH: 3.0000 mL | Freq: Once | INTRAVENOUS | Status: DC

## 2019-12-15 MED ORDER — SODIUM CHLORIDE 0.9 % IV SOLN
25.0000 mg | INTRAVENOUS | Status: DC | PRN
  Filled 2019-12-15: qty 0.5

## 2019-12-15 MED ORDER — DEXTROSE-NACL 5-0.45 % IV SOLN: INTRAVENOUS | Status: DC

## 2019-12-15 MED ORDER — NALOXONE HCL 0.4 MG/ML IJ SOLN: 0.4000 mg | INTRAMUSCULAR | Status: DC | PRN

## 2019-12-15 MED ORDER — KETOROLAC TROMETHAMINE 30 MG/ML IJ SOLN
30.0000 mg | Freq: Four times a day (QID) | INTRAMUSCULAR | Status: DC
  Administered 2019-12-16 (×2): 30 mg via INTRAVENOUS
  Filled 2019-12-15 (×3): qty 1

## 2019-12-15 MED ORDER — HYDROMORPHONE HCL 1 MG/ML IJ SOLN
2.0000 mg | Freq: Once | INTRAMUSCULAR | Status: AC
  Administered 2019-12-15: 2 mg via INTRAVENOUS
  Filled 2019-12-15: qty 2

## 2019-12-15 MED ORDER — KETOROLAC TROMETHAMINE 15 MG/ML IJ SOLN
15.0000 mg | Freq: Once | INTRAMUSCULAR | Status: AC
  Administered 2019-12-15: 15 mg via INTRAVENOUS
  Filled 2019-12-15: qty 1

## 2019-12-15 MED ORDER — GABAPENTIN 300 MG PO CAPS
300.0000 mg | ORAL_CAPSULE | Freq: Three times a day (TID) | ORAL | Status: DC
  Administered 2019-12-15 – 2019-12-16 (×2): 300 mg via ORAL
  Filled 2019-12-15 (×2): qty 1

## 2019-12-15 MED ORDER — SENNOSIDES-DOCUSATE SODIUM 8.6-50 MG PO TABS
1.0000 | ORAL_TABLET | Freq: Two times a day (BID) | ORAL | Status: DC
  Administered 2019-12-16: 1 via ORAL
  Filled 2019-12-15 (×2): qty 1

## 2019-12-15 MED ORDER — ONDANSETRON HCL 4 MG/2ML IJ SOLN: 4.0000 mg | Freq: Four times a day (QID) | INTRAMUSCULAR | Status: DC | PRN

## 2019-12-15 MED ORDER — MELATONIN 5 MG PO TABS
10.0000 mg | ORAL_TABLET | Freq: Every day | ORAL | Status: DC
  Administered 2019-12-16: 10 mg via ORAL
  Filled 2019-12-15 (×2): qty 2

## 2019-12-15 NOTE — ED Provider Notes (Signed)
[facility] EMERGENCY DEPARTMENT Provider Note   CSN: [id number] Arrival date & time: 12/15/19  1553     History Chief Complaint  Patient presents with  . Sickle Cell Pain Crisis    Lindsey [name] is a 49 y.o. female with history of [place] disease, chronic pain syndrome, opiate dependence and tolerance who presents L shoulder pain and R leg pain. She states it started a couple days ago and then got better but got worse again this morning. Pain is consistent with prior pain crises. She denies any other symptoms. Specifically no fever, chills, chest pain, SOB, cough, abdominal pain, N/V/D, urinary symptoms. She has not had any trauma to the area. She tried her home pain meds without relief. She takes Oxy 10mg  IR and ER. Her sickle cell provider is . Of note she called her sickle cell provider 2 days ago to get an early refill on her pain meds because she has been taking more due to increased pain. The pharmacy would not refill the pain meds earlier for her.  HPI     Past Medical History:  Diagnosis Date  . 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 Opioid dependence in remission (HCC)   . Sickle cell crisis [facility])     Patient Active Problem List   Diagnosis Date Noted  . Sickle cell anemia with crisis (HCC) 09/10/2019  . Anemia of chronic disease 09/10/2019  . Current mild episode of major depressive disorder (HCC)   . Sickle cell crisis (HCC) 02/28/2019  . Opioid dependence in remission (HCC) 10/01/2017  . Sickle cell disease (HCC) 10/01/2017  . Chronic pain 10/01/2017  . 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 Leukocytosis 09/03/2016  . Sickle cell pain crisis (HCC) 07/20/2012  . 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 APPENDECTOMY    . CESAREAN SECTION    . OTHER SURGICAL HISTORY     c-section     OB History   No obstetric history on file.     Family History  Problem  Relation Age of Onset  . Stroke Neg Hx        none that she knows of   . Seizures Neg Hx     Social History   Tobacco Use  . Smoking status: Current Some Day Smoker    Packs/day: 0.00    Types: Cigars  . Smokeless tobacco: Never Used  Vaping Use  . Vaping Use: Former  Substance Use Topics  . Alcohol use: Yes    Comment: occasionally  . Drug use: No    Home Medications Prior to Admission medications   Medication Sig Start Date End Date Taking? Authorizing Provider  Aspirin-Acetaminophen-Caffeine (GOODY HEADACHE PO) Take 1 Package by mouth every 6 (six) hours as needed. Headache and pain    [provider]  ergocalciferol (VITAMIN D2) 1.25 MG (50000 UT) capsule Take 1 capsule (50,000 Units total) by mouth once a week. 10/16/19   12/16/19, FNP  gabapentin (NEURONTIN) 300 MG capsule Take 1 capsule by mouth three times daily 12/13/19   02/12/20, FNP  hydrOXYzine (ATARAX/VISTARIL) 25 MG tablet Take 1 tablet (25 mg total) by mouth at bedtime as needed. 11/27/19   11/29/19, FNP  ibuprofen (ADVIL) 800 MG tablet TAKE 1 TABLET(800 MG) BY MOUTH EVERY 8 HOURS AS NEEDED Patient taking differently: Take 800 mg by mouth every 8 (eight) hours as needed  for moderate pain.  10/14/19   Massie Maroon, FNP  Melatonin 10 MG TABS Take 10 mg by mouth at bedtime.    [provider]  oxyCODONE (OXYCONTIN) 10 mg 12 hr tablet Take 1 tablet (10 mg total) by mouth every 12 (twelve) hours. 11/24/19   Massie Maroon, FNP  Oxycodone HCl 10 MG TABS Take 1 tablet (10 mg total) by mouth every 6 (six) hours as needed. 12/13/19   Quentin Angst, MD    Allergies    Patient has no known allergies.  Review of Systems   Review of Systems  Constitutional: Negative for chills and fever.  Respiratory: Negative for shortness of breath.   Cardiovascular: Negative for chest pain.  Gastrointestinal: Negative for abdominal pain, diarrhea, nausea and vomiting.  Genitourinary:  Negative for dysuria.  Musculoskeletal: Positive for arthralgias. Negative for back pain.  All other systems reviewed and are negative.   Physical Exam Updated Vital Signs BP 134/81 (BP Location: Left Arm)   Pulse 97   Temp 98.9 F (37.2 C) (Oral)   Resp (!) 22   Ht 5' (1.524 m)   Wt 68.9 kg   SpO2 96%   BMI 29.69 kg/m   Physical Exam Vitals and nursing note reviewed.  Constitutional:      General: She is in acute distress (crying, shaking).     Appearance: Normal appearance. She is well-developed. She is not ill-appearing.  HENT:     Head: Normocephalic and atraumatic.  Eyes:     General: No scleral icterus.       Right eye: No discharge.        Left eye: No discharge.     Conjunctiva/sclera: Conjunctivae normal.     Pupils: Pupils are equal, round, and reactive to light.  Cardiovascular:     Rate and Rhythm: Normal rate and regular rhythm.  Pulmonary:     Effort: Pulmonary effort is normal. No respiratory distress.     Breath sounds: Normal breath sounds.  Abdominal:     General: There is no distension.     Palpations: Abdomen is soft.     Tenderness: There is no abdominal tenderness.  Musculoskeletal:     Cervical back: Normal range of motion.     Comments: Left shoulder: No swelling or redness. FROM of shoulder, elbow, wrist. There is a bony growth over the L wrist which patient states she was told is a cyst  Right leg: FROM of the knee, ankle, foot. N/V intact. No swelling or redness   Skin:    General: Skin is warm and dry.  Neurological:     Mental Status: She is alert and oriented to person, place, and time.  Psychiatric:        Behavior: Behavior normal.     ED Results / Procedures / Treatments   Labs (all labs ordered are listed, but only abnormal results are displayed) Labs Reviewed  COMPREHENSIVE METABOLIC PANEL - Abnormal; Notable for the following components:      Result Value   Glucose, Bld 100 (*)    Alkaline Phosphatase 35 (*)    All  other components within normal limits  CBC WITH DIFFERENTIAL/PLATELET - Abnormal; Notable for the following components:   WBC 11.3 (*)    RBC 3.52 (*)    Hemoglobin 11.6 (*)    HCT 30.0 (*)    MCHC 38.7 (*)    Platelets 516 (*)    Lymphs Abs 5.6 (*)    All  other components within normal limits  RETICULOCYTES - Abnormal; Notable for the following components:   RBC. 3.51 (*)    Immature Retic Fract 20.9 (*)    All other components within normal limits  SARS CORONAVIRUS 2 BY RT PCR (HOSPITAL ORDER, PERFORMED IN [facility])  I-STAT BETA HCG BLOOD, ED ([facility], [facility], [facility] ONLY)    EKG None  Radiology No results found.  Procedures Procedures (including critical care time)  Medications Ordered in ED Medications  sodium chloride flush (NS) 0.9 % injection 3 mL (3 mLs Intravenous Not Given 12/15/19 1916)  dextrose 5 %-0.45 % sodium chloride infusion ( Intravenous New Bag/Given 12/15/19 1946)  HYDROmorphone (DILAUDID) injection 2 mg (2 mg Intravenous Given 12/15/19 1945)  HYDROmorphone (DILAUDID) injection 2 mg (2 mg Intravenous Given 12/15/19 2018)  HYDROmorphone (DILAUDID) injection 2 mg (2 mg Intravenous Given 12/15/19 2106)  oxyCODONE-acetaminophen (PERCOCET/ROXICET) 5-325 MG per tablet 2 tablet (2 tablets Oral Given 12/15/19 2120)    ED Course  I have reviewed the triage vital signs and the nursing notes.  Pertinent labs & imaging results that were available during my care of the patient were reviewed by me and considered in my medical decision making (see chart for details).  49 year old presents with pain in the L shoulder and R leg which she feels is similar to prior sickle cell crises. Her vitals are normal. She is crying and distressed and shaking. Exam is objectively unremarkable. She has no complaints of headache, chest pain, SOB, abdominal pain. Labs show mild leukocytosis (11), mild anemia (11), mild thrombocytosis (516) which could be from dehydration. Fluids and pain control  ordered. Retic count is normal.  1945: Received 1st dose Dilaudid.  1820: Received 2nd dose of Dilaudid. Pain is 7/10  2100: Pt rechecked. She is still crying and shaking. Will give one last IV dose.  2200: Rechecked pt. Pain is unchanged. Will request admission.  Discussed with Dr. Julian Reil with [facility] who will admit   MDM Rules/Calculators/A&P                           Final Clinical Impression(s) / ED Diagnoses Final diagnoses:  Sickle cell pain crisis (HCC)  Acute pain of left shoulder  Right leg pain    Rx / DC Orders ED Discharge Orders    None       Bethel Born, PA-C 12/15/19 2300    Sabas Sous, MD 12/18/19 [phone]

## 2019-12-15 NOTE — ED Triage Notes (Signed)
Right leg and left arm pain that feels like crisis pain. She states she took last oxy and gabapentin today.

## 2019-12-15 NOTE — H&P (Signed)
History and Physical    [name] WLN:[id number] DOB: Aug 20, 1970 DOA: 12/15/2019  PCP: Massie Maroon, FNP  Patient coming from: Home  I have personally briefly reviewed patient's old medical records in [facility]  Chief Complaint: Sickle cell pain crisis  HPI: Lindsey [name] is a 49 y.o. female with medical history significant of HGB [place] dz, opiate dependence.  Pt presents to ED with L shoulder and R leg pain.  Onset a couple of days ago, initially better but then got worse this morning.  Symptoms c/w sickle cell pain crisis.  No fever, no CP, no SOB, no cough, no abd pain.  Of note she called her sickle cell provider 2 days ago to get an early refill on her pain meds because she has been taking more due to increased pain. The pharmacy would not refill the pain meds earlier for her.   ED Course: Pain persists despite multiple rounds of IV dilaudid.   Review of Systems: As per HPI, otherwise all review of systems negative.  Past Medical History:  Diagnosis Date  . 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 Opioid dependence in remission (HCC)   . Sickle cell crisis [facility])     Past Surgical History:  Procedure Laterality Date  . APPENDECTOMY    . CESAREAN SECTION    . OTHER SURGICAL HISTORY     c-section     reports that she has been smoking cigars. She has been smoking about 0.00 packs per day. She has never used smokeless tobacco. She reports current alcohol use. She reports that she does not use drugs.  No Known Allergies  Family History  Problem Relation Age of Onset  . Stroke Neg Hx        none that she knows of   . Seizures Neg Hx      Prior to Admission medications   Medication Sig Start Date End Date Taking? Authorizing Provider  Aspirin-Acetaminophen-Caffeine (GOODY HEADACHE PO) Take 1 packet by mouth every 8 (eight) hours as needed (pain/headache).    Yes [provider]  ergocalciferol (VITAMIN D2) 1.25 MG (50000 UT) capsule Take 1  capsule (50,000 Units total) by mouth once a week. Patient taking differently: Take 50,000 Units by mouth every Wednesday.  10/16/19  Yes Massie Maroon, FNP  gabapentin (NEURONTIN) 300 MG capsule Take 1 capsule by mouth three times daily Patient taking differently: Take 300 mg by mouth 3 (three) times daily.  12/13/19  Yes Massie Maroon, FNP  hydrOXYzine (ATARAX/VISTARIL) 25 MG tablet Take 1 tablet (25 mg total) by mouth at bedtime as needed. Patient taking differently: Take 25 mg by mouth at bedtime.  11/27/19  Yes Massie Maroon, FNP  ibuprofen (ADVIL) 800 MG tablet TAKE 1 TABLET(800 MG) BY MOUTH EVERY 8 HOURS AS NEEDED Patient taking differently: Take 800 mg by mouth every 8 (eight) hours as needed for moderate pain.  10/14/19  Yes Massie Maroon, FNP  Melatonin 10 MG TABS Take 10 mg by mouth at bedtime.   Yes [provider]  oxyCODONE (OXYCONTIN) 10 mg 12 hr tablet Take 1 tablet (10 mg total) by mouth every 12 (twelve) hours. 11/24/19  Yes Massie Maroon, FNP  Oxycodone HCl 10 MG TABS Take 1 tablet (10 mg total) by mouth every 6 (six) hours as needed. Patient taking differently: Take 10 mg by mouth every 6 (six) hours as needed (pain).  12/13/19  Yes Quentin Angst, MD    Physical  Exam: Vitals:   12/15/19 1704 12/15/19 2015 12/15/19 2045 12/15/19 2245  BP:  106/69 108/71 107/77  Pulse:  65 74 67  Resp:  13 20 (!) 21  Temp:      TempSrc:      SpO2:  96% 96% 98%  Weight: 68.9 kg     Height: 5' (1.524 m)       Constitutional: NAD, calm, comfortable Eyes: PERRL, lids and conjunctivae normal ENMT: Mucous membranes are moist. Posterior pharynx clear of any exudate or lesions.Normal dentition.  Neck: normal, supple, no masses, no thyromegaly Respiratory: clear to auscultation bilaterally, no wheezing, no crackles. Normal respiratory effort. No accessory muscle use.  Cardiovascular: Regular rate and rhythm, no murmurs / rubs / gallops. No extremity edema. 2+ pedal  pulses. No carotid bruits.  Abdomen: no tenderness, no masses palpated. No hepatosplenomegaly. Bowel sounds positive.  Musculoskeletal: no clubbing / cyanosis. No joint deformity upper and lower extremities. Good ROM, no contractures. Normal muscle tone.  Skin: no rashes, lesions, ulcers. No induration Neurologic: CN 2-12 grossly intact. Sensation intact, DTR normal. Strength 5/5 in all 4.  Psychiatric: Normal judgment and insight. Alert and oriented x 3. Normal mood.    Labs on Admission: I have personally reviewed following labs and imaging studies  CBC: Recent Labs  Lab 12/15/19 1734  WBC 11.3*  NEUTROABS 4.3  HGB 11.6*  HCT 30.0*  MCV 85.2  PLT 516*   Basic Metabolic Panel: Recent Labs  Lab 12/15/19 1734  NA 142  K 4.0  CL 108  CO2 22  GLUCOSE 100*  BUN 8  CREATININE 0.99  CALCIUM 9.4   GFR: Estimated Creatinine Clearance: 60.9 mL/min (by C-G formula based on SCr of 0.99 mg/dL). Liver Function Tests: Recent Labs  Lab 12/15/19 1734  AST 21  ALT 16  ALKPHOS 35*  BILITOT 1.0  PROT 7.1  ALBUMIN 4.1   No results for input(s): LIPASE, AMYLASE in the last 168 hours. No results for input(s): AMMONIA in the last 168 hours. Coagulation Profile: No results for input(s): INR, PROTIME in the last 168 hours. Cardiac Enzymes: No results for input(s): CKTOTAL, CKMB, CKMBINDEX, TROPONINI in the last 168 hours. BNP (last 3 results) No results for input(s): PROBNP in the last 8760 hours. HbA1C: No results for input(s): HGBA1C in the last 72 hours. CBG: No results for input(s): GLUCAP in the last 168 hours. Lipid Profile: No results for input(s): CHOL, HDL, LDLCALC, TRIG, CHOLHDL, LDLDIRECT in the last 72 hours. Thyroid Function Tests: No results for input(s): TSH, T4TOTAL, FREET4, T3FREE, THYROIDAB in the last 72 hours. Anemia Panel: Recent Labs    12/15/19 1734  RETICCTPCT 3.0   Urine analysis:    Component Value Date/Time   COLORURINE YELLOW 10/24/2019 0230     APPEARANCEUR CLEAR 10/24/2019 0230   LABSPEC 1.016 10/24/2019 0230   PHURINE 5.0 10/24/2019 0230   GLUCOSEU NEGATIVE 10/24/2019 0230   HGBUR NEGATIVE 10/24/2019 0230   BILIRUBINUR NEGATIVE 10/24/2019 0230   BILIRUBINUR neg 10/15/2019 0949   KETONESUR NEGATIVE 10/24/2019 0230   PROTEINUR NEGATIVE 10/24/2019 0230   UROBILINOGEN 1.0 10/15/2019 0949   UROBILINOGEN 1.0 12/25/2012 1755   NITRITE NEGATIVE 10/24/2019 0230   LEUKOCYTESUR TRACE (A) 10/24/2019 0230    Radiological Exams on Admission: No results found.  EKG: Independently reviewed.  Assessment/Plan Active Problems:   Sickle cell pain crisis (HCC)    1. Sickle cell pain crisis - 1. Sickle cell pathway 2. Scheduled toradol 3. Dilaudid PCA as per  prior settings 4. Cont home oxycontin long acting 5. HGB 11.6 today  DVT prophylaxis: Lovenox Code Status: Full Family Communication: No family in room Disposition Plan: Home after pain controlled Consults called: None Admission status: Admit to inpatient  Severity of Illness: The appropriate patient status for this patient is INPATIENT. Inpatient status is judged to be reasonable and necessary in order to provide the required intensity of service to ensure the patient's safety. The patient's presenting symptoms, physical exam findings, and initial radiographic and laboratory data in the context of their chronic comorbidities is felt to place them at high risk for further clinical deterioration. Furthermore, it is not anticipated that the patient will be medically stable for discharge from the hospital within 2 midnights of admission. The following factors support the patient status of inpatient.   IP status for dilaudid PCA for sickle cell pain crisis.   * I certify that at the point of admission it is my clinical judgment that the patient will require inpatient hospital care spanning beyond 2 midnights from the point of admission due to high intensity of service, high risk  for further deterioration and high frequency of surveillance required.*    Caydn Justen M. DO [facility]  How to contact the [facility] Attending or Consulting provider 7A - 7P or covering provider during after hours 7P -7A, for this patient?  1. Check the care team in [facility] and look for a) attending/consulting [facility] provider listed and b) the [facility] team listed 2. Log into [email or web address]  Amion Physician Scheduling and messaging for groups and whole hospitals  On call and physician scheduling software for group practices, residents, hospitalists and other medical providers for call, clinic, rotation and shift schedules. On[employer] is a hospital-wide system for scheduling doctors and paging doctors on call. EasyPlot is for scientific plotting and data analysis.  [email or web address]  and use [facility]'s universal password to access. If you do not have the password, please contact the hospital operator.  3. Locate the [facility] provider you are looking for under [facility] and page to a number that you can be directly reached. 4. If you still have difficulty reaching the provider, please page the [facility] (Director on Call) for the Hospitalists listed on amion for assistance.  12/15/2019, 11:08 PM

## 2019-12-15 NOTE — ED Notes (Signed)
[facility] called to transport pt to [facility] 

## 2019-12-16 DIAGNOSIS — M79604 Pain in right leg: Secondary | ICD-10-CM | POA: Diagnosis not present

## 2019-12-16 DIAGNOSIS — G894 Chronic pain syndrome: Secondary | ICD-10-CM | POA: Diagnosis not present

## 2019-12-16 DIAGNOSIS — D5700 Hb-SS disease with crisis, unspecified: Secondary | ICD-10-CM | POA: Diagnosis not present

## 2019-12-16 DIAGNOSIS — M25512 Pain in left shoulder: Secondary | ICD-10-CM | POA: Diagnosis not present

## 2019-12-16 LAB — SARS CORONAVIRUS 2 BY RT PCR (HOSPITAL ORDER, PERFORMED IN ~~LOC~~ HOSPITAL LAB): SARS Coronavirus 2: NEGATIVE

## 2019-12-16 MED ORDER — OXYCODONE HCL 10 MG PO TABS: 10.0000 mg | ORAL_TABLET | ORAL | 0 refills | Status: DC | PRN

## 2019-12-16 NOTE — Discharge Instructions (Signed)

## 2019-12-16 NOTE — Discharge Summary (Signed)
Physician Discharge Summary  Lindsey [name] HER:[id number] DOB: 1970/07/22 DOA: 12/15/2019  PCP: Massie Maroon, FNP  Admit date: 12/15/2019  Discharge date: 12/16/2019  Discharge Diagnoses:  Active Problems:   Sickle cell pain crisis [facility])   Discharge Condition: Stable  Disposition:  Pt is discharged home in good condition and is to follow up with Massie Maroon, FNP this week to have labs evaluated. Lindsey [name] is instructed to increase activity slowly and balance with rest for the next few days, and use prescribed medication to complete treatment of pain  Diet: Regular Wt Readings from Last 3 Encounters:  12/15/19 68.9 kg  11/19/19 68.6 kg  10/24/19 69.2 kg    History of present illness:  Lindsey [name] is a 49 year old female with a medical history significant for sickle cell disease type [place], opiate dependence and tolerance, and history of anemia of chronic disease presented to the emergency department with left shoulder and right leg pain.  Onset of pain was 2 days prior, initially better but then got worse.  Symptoms are consistent with previous sickle cell pain crisis.  Patient denies any fever, chest pain, shortness of breath, cough, or abdominal pain.  Of note, she called her sickle cell provider 2 days prior to get an early refill on pain medications because she has been taking a little more due to increased pain.  The pharmacy would not refill the pain medications earlier for her.  ER course: Labs and vitals are mostly within normal limits.  Pain persists despite multiple rounds of IV Dilaudid.  Hospital Course:  Sickle cell disease with pain crisis Patient was admitted for sickle cell pain crisis and managed appropriately with IVF, IV Dilaudid via PCA and IV Toradol, as well as other adjunct therapies per sickle cell pain management protocols.  Patient states that pain intensity is 2/10.  She is requesting discharge home.  She states that she can manage at home on  current medication regimen.  She says that she is out of oxycodone due to having to take more medications to avert sickle cell pain crisis.  Patient's medications changed to oxycodone 10 mg every 4 hours.  Oxycodone 10 mg #36 was sent to patient's pharmacy.  Notify pharmacy to override her early prescription refill.  Reviewed PDMP prior to prescribing opiate medication, no inconsistencies noted.  Patient is alert, oriented, and ambulating without assistance.  Patient was discharged home today in a hemodynamically stable condition.   Discharge Exam: Vitals:   12/16/19 1214 12/16/19 1242  BP: (!) 82/56   Pulse: (!) 58   Resp: 14 16  Temp: 98.3 F (36.8 C)   SpO2: 97% 97%   Vitals:   12/16/19 0804 12/16/19 0809 12/16/19 1214 12/16/19 1242  BP:  (!) 98/52 (!) 82/56   Pulse:  (!) 58 (!) 58   Resp: 14 16 14 16   Temp:  97.8 F (36.6 C) 98.3 F (36.8 C)   TempSrc:  Oral Oral   SpO2: 99% 97% 97% 97%  Weight:      Height:        General appearance : Awake, alert, not in any distress. Speech Clear. Not toxic looking HEENT: Atraumatic and Normocephalic, pupils equally reactive to light and accomodation Neck: Supple, no JVD. No cervical lymphadenopathy.  Chest: Good air entry bilaterally, no added sounds  CVS: S1 S2 regular, no murmurs.  Abdomen: Bowel sounds present, Non tender and not distended with no gaurding, rigidity or rebound. Extremities: B/L Lower Ext shows no edema,  both legs are warm to touch Neurology: Awake alert, and oriented X 3, CN II-XII intact, Non focal Skin: No Rash  Discharge Instructions  Discharge Instructions    Discharge patient   Complete by: As directed    Discharge disposition: 01-Home or Self Care   Discharge patient date: 12/16/2019     Allergies as of 12/16/2019   No Known Allergies     Medication List    TAKE these medications   ergocalciferol 1.25 MG (50000 UT) capsule Commonly known as: VITAMIN D2 Take 1 capsule (50,000 Units total) by mouth  once a week. What changed: when to take this   gabapentin 300 MG capsule Commonly known as: NEURONTIN Take 1 capsule by mouth three times daily What changed:   how much to take  how to take this  when to take this  additional instructions   GOODY HEADACHE PO Take 1 packet by mouth every 8 (eight) hours as needed (pain/headache).   hydrOXYzine 25 MG tablet Commonly known as: ATARAX/VISTARIL Take 1 tablet (25 mg total) by mouth at bedtime as needed. What changed: when to take this   ibuprofen 800 MG tablet Commonly known as: ADVIL TAKE 1 TABLET(800 MG) BY MOUTH EVERY 8 HOURS AS NEEDED What changed: See the new instructions.   Melatonin 10 MG Tabs Take 10 mg by mouth at bedtime.   oxyCODONE 10 mg 12 hr tablet Commonly known as: OXYCONTIN Take 1 tablet (10 mg total) by mouth every 12 (twelve) hours. What changed: Another medication with the same name was changed. Make sure you understand how and when to take each.   Oxycodone HCl 10 MG Tabs Take 1 tablet (10 mg total) by mouth every 4 (four) hours as needed. What changed: when to take this       The results of significant diagnostics from this hospitalization (including imaging, microbiology, ancillary and laboratory) are listed below for reference.    Significant Diagnostic Studies: No results found.  Microbiology: Recent Results (from the past 240 hour(s))  SARS Coronavirus 2 by RT PCR (hospital order, performed in [facility] hospital lab) Nasopharyngeal Nasopharyngeal Swab     Status: None   Collection Time: 12/15/19 10:47 PM   Specimen: Nasopharyngeal Swab  Result Value Ref Range Status   SARS Coronavirus 2 NEGATIVE NEGATIVE Final    Comment: (NOTE) SARS-CoV-2 target nucleic acids are NOT DETECTED.  The SARS-CoV-2 RNA is generally detectable in upper and lower respiratory specimens during the acute phase of infection. The lowest concentration of SARS-CoV-2 viral copies this assay can detect is 250 copies  / mL. A negative result does not preclude SARS-CoV-2 infection and should not be used as the sole basis for treatment or other patient management decisions.  A negative result may occur with improper specimen collection / handling, submission of specimen other than nasopharyngeal swab, presence of viral mutation(s) within the areas targeted by this assay, and inadequate number of viral copies (<250 copies / mL). A negative result must be combined with clinical observations, patient history, and epidemiological information.  Fact Sheet for Patients:   BoilerBrush.com.cy  Fact Sheet for Healthcare Providers: https://pope.com/  This test is not yet approved or  cleared by the [place] FDA and has been authorized for detection and/or diagnosis of SARS-CoV-2 by FDA under an Emergency Use Authorization (EUA).  This EUA will remain in effect (meaning this test can be used) for the duration of the COVID-19 declaration under Section 564(b)(1) of the Act, 21 U.S.C. section  360bbb-3(b)(1), unless the authorization is terminated or revoked sooner.  Performed at [facility], [address], [place], [place] [address]      Labs: Basic Metabolic Panel: Recent Labs  Lab 12/15/19 1734  NA 142  K 4.0  CL 108  CO2 22  GLUCOSE 100*  BUN 8  CREATININE 0.99  CALCIUM 9.4   Liver Function Tests: Recent Labs  Lab 12/15/19 1734  AST 21  ALT 16  ALKPHOS 35*  BILITOT 1.0  PROT 7.1  ALBUMIN 4.1   No results for input(s): LIPASE, AMYLASE in the last 168 hours. No results for input(s): AMMONIA in the last 168 hours. CBC: Recent Labs  Lab 12/15/19 1734  WBC 11.3*  NEUTROABS 4.3  HGB 11.6*  HCT 30.0*  MCV 85.2  PLT 516*   Cardiac Enzymes: No results for input(s): CKTOTAL, CKMB, CKMBINDEX, TROPONINI in the last 168 hours. BNP: Invalid input(s): POCBNP CBG: No results for input(s): GLUCAP in the last 168 hours.  Time  coordinating discharge: 35 minutes  Signed:  [name]  APRN, MSN, FNP-C Patient Care [facility] [address] [place], [place] [address] [phone]  [facility] 12/16/2019, 2:14 PM

## 2019-12-17 DIAGNOSIS — M25512 Pain in left shoulder: Secondary | ICD-10-CM

## 2019-12-17 DIAGNOSIS — M79604 Pain in right leg: Secondary | ICD-10-CM

## 2019-12-20 ENCOUNTER — Telehealth: Payer: Self-pay | Admitting: Family Medicine

## 2019-12-20 ENCOUNTER — Other Ambulatory Visit: Payer: Self-pay | Admitting: Family Medicine

## 2019-12-20 DIAGNOSIS — G894 Chronic pain syndrome: Secondary | ICD-10-CM

## 2019-12-20 MED ORDER — OXYCODONE HCL 10 MG PO TABS: 10.0000 mg | ORAL_TABLET | ORAL | 0 refills | Status: DC | PRN

## 2019-12-20 NOTE — Progress Notes (Signed)
Reviewed PDMP substance reporting system prior to prescribing opiate medications. No inconsistencies noted.  Meds ordered this encounter  Medications   Oxycodone HCl 10 MG TABS    Sig: Take 1 tablet (10 mg total) by mouth every 4 (four) hours as needed.    Dispense:  60 tablet    Refill:  0    Order Specific Question:   Supervising Provider    Answer:   [name], [name]   Lindsey Fussell Moore Decorian Schuenemann  APRN, MSN, FNP-C Patient Care [facility] [address]  [place], [place] [address] [phone]  

## 2019-12-20 NOTE — Telephone Encounter (Signed)
done

## 2019-12-23 ENCOUNTER — Telehealth: Payer: Self-pay | Admitting: Family Medicine

## 2019-12-23 ENCOUNTER — Other Ambulatory Visit: Payer: Self-pay | Admitting: Family Medicine

## 2019-12-23 ENCOUNTER — Other Ambulatory Visit: Payer: Self-pay

## 2019-12-23 DIAGNOSIS — G894 Chronic pain syndrome: Secondary | ICD-10-CM

## 2019-12-23 MED ORDER — OXYCODONE HCL 10 MG PO TABS: 10.0000 mg | ORAL_TABLET | ORAL | 0 refills | Status: DC | PRN

## 2019-12-23 NOTE — Telephone Encounter (Signed)
Requesting oxycodone 10 mg be sent to [place] outpatient pharmacy 

## 2019-12-23 NOTE — Telephone Encounter (Signed)
Requesting oxycodone 10 mg be sent to [facility] outpatient pharmacy

## 2019-12-24 NOTE — Telephone Encounter (Signed)
DOne

## 2019-12-25 ENCOUNTER — Other Ambulatory Visit: Payer: Self-pay | Admitting: Internal Medicine

## 2019-12-25 ENCOUNTER — Telehealth: Payer: Self-pay | Admitting: Family Medicine

## 2019-12-25 MED ORDER — OXYCODONE HCL ER 10 MG PO T12A: 10.0000 mg | EXTENDED_RELEASE_TABLET | Freq: Two times a day (BID) | ORAL | 0 refills | Status: AC

## 2019-12-25 NOTE — Telephone Encounter (Signed)
Refilled

## 2019-12-30 ENCOUNTER — Telehealth: Payer: Self-pay

## 2019-12-30 NOTE — Telephone Encounter (Signed)
Requested medication has been sent to the pharmacy on 12-23-2019 and 12-25-2019

## 2020-01-01 ENCOUNTER — Telehealth: Payer: Self-pay | Admitting: Family Medicine

## 2020-01-01 ENCOUNTER — Other Ambulatory Visit: Payer: Self-pay | Admitting: Family Medicine

## 2020-01-01 DIAGNOSIS — G894 Chronic pain syndrome: Secondary | ICD-10-CM

## 2020-01-01 MED ORDER — OXYCODONE HCL 10 MG PO TABS: 10.0000 mg | ORAL_TABLET | ORAL | 0 refills | Status: DC | PRN

## 2020-01-01 NOTE — Progress Notes (Signed)
Reviewed PDMP substance reporting system prior to prescribing opiate medications. No inconsistencies noted.   Medication cannot be filled prior to 12/24/2019  Meds ordered this encounter  Medications   Oxycodone HCl 10 MG TABS    Sig: Take 1 tablet (10 mg total) by mouth every 4 (four) hours as needed.    Dispense:  60 tablet    Refill:  0    Order Specific Question:   Supervising Provider    Answer:   Quentin Angst [[id number]]     [name]  APRN, MSN, FNP-C Patient Care [facility] [address] [place], [place] [address] [phone]

## 2020-01-01 NOTE — Telephone Encounter (Signed)
Done

## 2020-01-07 ENCOUNTER — Ambulatory Visit (INDEPENDENT_AMBULATORY_CARE_PROVIDER_SITE_OTHER): Payer: Medicaid Other | Admitting: Family Medicine

## 2020-01-07 ENCOUNTER — Encounter: Payer: Self-pay | Admitting: Family Medicine

## 2020-01-07 ENCOUNTER — Other Ambulatory Visit: Payer: Self-pay

## 2020-01-07 VITALS — BP 127/82 | HR 92 | Temp 98.0°F | Ht 60.0 in | Wt 145.0 lb

## 2020-01-07 DIAGNOSIS — G894 Chronic pain syndrome: Secondary | ICD-10-CM | POA: Diagnosis not present

## 2020-01-07 DIAGNOSIS — D5700 Hb-SS disease with crisis, unspecified: Secondary | ICD-10-CM | POA: Diagnosis not present

## 2020-01-07 DIAGNOSIS — M67432 Ganglion, left wrist: Secondary | ICD-10-CM

## 2020-01-07 DIAGNOSIS — R82998 Other abnormal findings in urine: Secondary | ICD-10-CM | POA: Diagnosis not present

## 2020-01-07 LAB — POCT URINALYSIS DIPSTICK
Bilirubin, UA: NEGATIVE
Blood, UA: NEGATIVE
Glucose, UA: NEGATIVE
Ketones, UA: NEGATIVE
Nitrite, UA: NEGATIVE
Protein, UA: NEGATIVE
Spec Grav, UA: 1.02 (ref 1.010–1.025)
Urobilinogen, UA: 0.2 E.U./dL
pH, UA: 5.5 (ref 5.0–8.0)

## 2020-01-07 NOTE — Progress Notes (Signed)
[facility]    Established Patient Office Visit  Subjective:  Patient ID: Lindsey [name], female    DOB: 03-12-1971  Age: 49 y.o. MRN: [id number]  CC:  Chief Complaint  Patient presents with  . Follow-up    left wrist pain due to cyst,  having pain still dealing with crisis    HPI Lindsey [name] is a 49 year old female with a medical history significant for sickle cell disease, chronic pain syndrome, opiate dependence and tolerance, tobacco dependence, and history of anemia presents accompanied by her husband for medication management.  Patient is complaining of frequent sickle cell pain crisis and has 2 use more medication than prescribed.  She states that oxycodone 10 mg every 6 hours has been ineffective.  Medication is only lasting about 2-3 hours, and patient typically has to take a second pill.  Patient's pain is primarily to low back and lower extremities.  She has not been taking OxyContin consistently.  Current pain intensity is 8/10 characterized intermittent and throbbing.  She currently denies any headache, chest pain, urinary symptoms, nausea, vomiting, or diarrhea.  Patient also has a left wrist ganglion cyst. Patient reports that cyst has gotten larger over the past several weeks. Also, patient says that left wrist is now tender to touch. Patient has not attempted any over the counter interventions to alleviate this problem.   Past Medical History:  Diagnosis Date  . 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 Opioid dependence in remission (HCC)   . Sickle cell crisis [facility])     Past Surgical History:  Procedure Laterality Date  . APPENDECTOMY    . CESAREAN SECTION    . OTHER SURGICAL HISTORY     c-section    Family History  Problem Relation Age of Onset  . Stroke Neg Hx        none that she knows of   . Seizures Neg Hx     Social History   Socioeconomic History  . Marital status: Married    Spouse name: Not on  file  . Number of children: 2  . Years of education: Not on file  . Highest education level: Not on file  Occupational History  . Not on file  Tobacco Use  . Smoking status: Current Some Day Smoker    Packs/day: 0.00    Types: Cigars  . Smokeless tobacco: Never Used  Vaping Use  . Vaping Use: Former  Substance and Sexual Activity  . Alcohol use: Yes    Comment: occasionally  . Drug use: No  . Sexual activity: Yes    Birth control/protection: None  Other Topics Concern  . Not on file  Social History Narrative   Lives at home with husband and son   Right handed   Social Determinants of Health   Financial Resource Strain:   . Difficulty of Paying Living Expenses: Not on file  Food Insecurity:   . Worried About Programme researcher, broadcasting/film/video in the Last Year: Not on file  . Ran Out of Food in the Last Year: Not on file  Transportation Needs:   . Lack of Transportation (Medical): Not on file  . Lack of Transportation (Non-Medical): Not on file  Physical Activity:   . Days of Exercise per Week: Not on file  . Minutes of Exercise per Session: Not on file  Stress:   . Feeling of Stress : Not on file  Social Connections:   .  Frequency of Communication with Friends and Family: Not on file  . Frequency of Social Gatherings with Friends and Family: Not on file  . Attends Religious Services: Not on file  . Active Member of Clubs or Organizations: Not on file  . Attends Banker Meetings: Not on file  . Marital Status: Not on file  Intimate Partner Violence:   . Fear of Current or Ex-Partner: Not on file  . Emotionally Abused: Not on file  . Physically Abused: Not on file  . Sexually Abused: Not on file    Outpatient Medications Prior to Visit  Medication Sig Dispense Refill  . Aspirin-Acetaminophen-Caffeine (GOODY HEADACHE PO) Take 1 packet by mouth every 8 (eight) hours as needed (pain/headache).     . ergocalciferol (VITAMIN D2) 1.25 MG (50000 UT) capsule Take 1 capsule  (50,000 Units total) by mouth once a week. (Patient taking differently: Take 50,000 Units by mouth every Wednesday. ) 12 capsule 1  . gabapentin (NEURONTIN) 300 MG capsule Take 1 capsule by mouth three times daily (Patient taking differently: Take 300 mg by mouth 3 (three) times daily. ) 90 capsule 1  . hydrOXYzine (ATARAX/VISTARIL) 25 MG tablet Take 1 tablet (25 mg total) by mouth at bedtime as needed. (Patient taking differently: Take 25 mg by mouth at bedtime. ) 30 tablet 1  . ibuprofen (ADVIL) 800 MG tablet TAKE 1 TABLET(800 MG) BY MOUTH EVERY 8 HOURS AS NEEDED (Patient taking differently: Take 800 mg by mouth every 8 (eight) hours as needed for moderate pain. ) 60 tablet 5  . Melatonin 10 MG TABS Take 10 mg by mouth at bedtime.    [name] oxyCODONE (OXYCONTIN) 10 mg 12 hr tablet Take 1 tablet (10 mg total) by mouth every 12 (twelve) hours. 60 tablet 0  . Oxycodone HCl 10 MG TABS Take 1 tablet (10 mg total) by mouth every 4 (four) hours as needed. 60 tablet 0   No facility-administered medications prior to visit.    No Known Allergies  ROS Review of Systems  Constitutional: Negative.  Negative for fatigue.  HENT: Negative.   Eyes: Negative.   Respiratory: Negative.   Cardiovascular: Negative.   Gastrointestinal: Negative.   Genitourinary: Negative.   Musculoskeletal: Positive for arthralgias and joint swelling.  Neurological: Negative.   Hematological: Negative.   Psychiatric/Behavioral: Negative.       Objective:    Physical Exam Constitutional:      Appearance: Normal appearance.  HENT:     Nose: Nose normal.  Eyes:     Pupils: Pupils are equal, round, and reactive to light.  Cardiovascular:     Rate and Rhythm: Normal rate and regular rhythm.     Pulses: Normal pulses.  Pulmonary:     Effort: Pulmonary effort is normal.  Abdominal:     General: Abdomen is flat. Bowel sounds are normal.  Musculoskeletal:        General: Normal range of motion.  Skin:    General: Skin  is warm.          Comments: Round, Raised, movable, cyst, jelly like to left lateral wrist.   Neurological:     General: No focal deficit present.     Mental Status: She is alert. Mental status is at baseline.  Psychiatric:        Mood and Affect: Mood normal.        Behavior: Behavior normal.        Thought Content: Thought content normal.  Judgment: Judgment normal.     BP 127/82   Pulse 92   Temp 98 F (36.7 C) (Temporal)   Ht 5' (1.524 m)   Wt 145 lb (65.8 kg)   SpO2 93%   BMI 28.32 kg/m  Wt Readings from Last 3 Encounters:  01/07/20 145 lb (65.8 kg)  12/15/19 152 lb (68.9 kg)  11/19/19 151 lb 3.2 oz (68.6 kg)     Health Maintenance Due  Topic Date Due  . Hepatitis C Screening  Never done  . COVID-19 Vaccine (1) Never done  . PAP SMEAR-Modifier  Never done    There are no preventive care reminders to display for this patient.  Lab Results  Component Value Date   TSH 2.480 02/20/2019   Lab Results  Component Value Date   WBC 11.3 (H) 12/15/2019   HGB 11.6 (L) 12/15/2019   HCT 30.0 (L) 12/15/2019   MCV 85.2 12/15/2019   PLT 516 (H) 12/15/2019   Lab Results  Component Value Date   NA 142 12/15/2019   K 4.0 12/15/2019   CO2 22 12/15/2019   GLUCOSE 100 (H) 12/15/2019   BUN 8 12/15/2019   CREATININE 0.99 12/15/2019   BILITOT 1.0 12/15/2019   ALKPHOS 35 (L) 12/15/2019   AST 21 12/15/2019   ALT 16 12/15/2019   PROT 7.1 12/15/2019   ALBUMIN 4.1 12/15/2019   CALCIUM 9.4 12/15/2019   ANIONGAP 12 12/15/2019   Lab Results  Component Value Date   CHOL  07/25/2007    119        ATP III CLASSIFICATION:  <200     mg/dL   Desirable  662-947  mg/dL   Borderline High  >=654    mg/dL   High   Lab Results  Component Value Date   HDL 33 (L) 07/25/2007   Lab Results  Component Value Date   LDLCALC  07/25/2007    78        Total Cholesterol/HDL:CHD Risk Coronary Heart Disease Risk Table                     Men   Women  1/2 Average Risk   3.4    3.3   Lab Results  Component Value Date   TRIG 38 07/25/2007   Lab Results  Component Value Date   CHOLHDL 3.6 07/25/2007   No results found for: HGBA1C    Assessment & Plan:   Problem List Items Addressed This Visit      Other   Chronic pain   Relevant Orders   Sickle Cell Panel   [id number] 11+Oxyco+Alc+Crt-Bund    Other Visit Diagnoses    Urine leukocytes    -  Primary   Relevant Orders   POCT urinalysis dipstick (Completed)   Ganglion cyst of wrist, left       Relevant Orders   Ambulatory referral to General Surgery   Vitamin D deficiency          Chronic pain syndrome Reviewed PDMP substance reporting system prior to prescribing opiate medications. No inconsistencies noted.   - Sickle Cell Panel - [id number] 11+Oxyco+Alc+Crt-Bund - Opiates Confirmation, Urine - Oxycodone/Oxymorphone, Confirm  Sickle cell anemia with pain (HCC)  Continue folic acid 1 mg daily to prevent aplastic bone marrow crises. Discussed the importance of using medication appropriately in order to achieve positive outcomes. Acute and chronic painful episodes - We agreed on (Opiate dose and amount of pills  per month) plan on titrating  her Medication dose. We discussed that pt is to receive her Schedule II prescriptions only from us. Pt is also aware that the prescription history is available to us online on PDMP. Cont[place]orearolled substance agreement signed previously.We reviewed the terms of our pain agreement, including the need to keep medicines in a safe locked location away from children or pets, and the need to report excess sedation or constipation, measures to avoid constipation, and policies related to early refills and stolen prescriptions. According to the [place] Chronic [facility] Initiative program, we have reviewed details related to analgesia, adverse effects, aberrant behaviors.    Ganglion cyst of wrist, left Patient warrants surgical consults - Ambulatory referral to General Surgery  Urine  leukocytes - POCT urinalysis dipstick - Urine Culture   Follow-up: Return in about 3 months (around 04/07/2020).    Nolon NationsLachina Moore Nohealani Medinger  APRN, MSN, FNP-C Patient Care [facility] [address]  [place], [place] [id number] [phone]

## 2020-01-07 NOTE — Patient Instructions (Signed)
.  vs

## 2020-01-08 ENCOUNTER — Telehealth: Payer: Self-pay

## 2020-01-08 LAB — CMP14+CBC/D/PLT+FER+RETIC+V...
ALT: 10 IU/L (ref 0–32)
AST: 19 IU/L (ref 0–40)
Albumin/Globulin Ratio: 1.9 (ref 1.2–2.2)
Albumin: 4.7 g/dL (ref 3.8–4.8)
Alkaline Phosphatase: 42 IU/L — ABNORMAL LOW (ref 48–121)
BUN/Creatinine Ratio: 12 (ref 9–23)
BUN: 12 mg/dL (ref 6–24)
Basophils Absolute: 0.1 10*3/uL (ref 0.0–0.2)
Basos: 1 %
Bilirubin Total: 1 mg/dL (ref 0.0–1.2)
CO2: 21 mmol/L (ref 20–29)
Calcium: 9.7 mg/dL (ref 8.7–10.2)
Chloride: 105 mmol/L (ref 96–106)
Creatinine, Ser: 1 mg/dL (ref 0.57–1.00)
EOS (ABSOLUTE): 0.4 10*3/uL (ref 0.0–0.4)
Eos: 4 %
Ferritin: 148 ng/mL (ref 15–150)
GFR calc Af Amer: 77 mL/min/{1.73_m2} (ref >59)
GFR calc non Af Amer: 67 mL/min/{1.73_m2} (ref >59)
Globulin, Total: 2.5 g/dL (ref 1.5–4.5)
Glucose: 92 mg/dL (ref 65–99)
Hematocrit: 34.3 % (ref 34.0–46.6)
Hemoglobin: 11.5 g/dL (ref 11.1–15.9)
Immature Grans (Abs): 0 10*3/uL (ref 0.0–0.1)
Immature Granulocytes: 0 %
Lymphocytes Absolute: 2 10*3/uL (ref 0.7–3.1)
Lymphs: 19 %
MCH: 32.1 pg (ref 26.6–33.0)
MCHC: 33.5 g/dL (ref 31.5–35.7)
MCV: 96 fL (ref 79–97)
Monocytes Absolute: 0.5 10*3/uL (ref 0.1–0.9)
Monocytes: 5 %
Neutrophils Absolute: 7.6 10*3/uL — ABNORMAL HIGH (ref 1.4–7.0)
Neutrophils: 71 %
Platelets: 436 10*3/uL (ref 150–450)
Potassium: 4.8 mmol/L (ref 3.5–5.2)
RBC: 3.58 x10E6/uL — ABNORMAL LOW (ref 3.77–5.28)
RDW: 15.7 % — ABNORMAL HIGH (ref 11.7–15.4)
Retic Ct Pct: 2.1 % (ref 0.6–2.6)
Sodium: 140 mmol/L (ref 134–144)
Total Protein: 7.2 g/dL (ref 6.0–8.5)
Vit D, 25-Hydroxy: 42.4 ng/mL (ref 30.0–100.0)
WBC: 10.6 10*3/uL (ref 3.4–10.8)

## 2020-01-08 NOTE — Telephone Encounter (Signed)
Called LVM for call back to office to discuss results

## 2020-01-08 NOTE — Telephone Encounter (Signed)
-----   Message from Massie Maroon, [place] sent at 01/08/2020  5:51 AM EDT ----- Regarding: lab results Please inform patient that all laboratory values are within a normal range.  Remind patient that OxyContin was increased to 20 mg twice daily for greater pain control.  We will continue oxycodone 10 mg every 6 hours as needed.  She will need to continue to request medications on prescription line as needed.  Prescriptions will be sent to pharmacy on Tuesdays and/or Fridays per policy.  Remind her to utilize MyChart to communicate with our staff.  [name]  APRN, MSN, FNP-C Patient Care [facility] [address] [place], [place] [address] [phone]

## 2020-01-09 LAB — URINE CULTURE

## 2020-01-12 LAB — DRUG SCREEN 764883 11+OXYCO+ALC+CRT-BUND
Amphetamines, Urine: NEGATIVE ng/mL
BENZODIAZ UR QL: NEGATIVE ng/mL
Barbiturate: NEGATIVE ng/mL
Cannabinoid Quant, Ur: NEGATIVE ng/mL
Cocaine (Metabolite): NEGATIVE ng/mL
Creatinine: 107.9 mg/dL (ref 20.0–300.0)
Ethanol: NEGATIVE %
Meperidine: NEGATIVE ng/mL
Methadone Screen, Urine: NEGATIVE ng/mL
Phencyclidine: NEGATIVE ng/mL
Propoxyphene: NEGATIVE ng/mL
Tramadol: NEGATIVE ng/mL
pH, Urine: 5.2 (ref 4.5–8.9)

## 2020-01-12 LAB — OXYCODONE/OXYMORPHONE, CONFIRM
OXYCODONE/OXYMORPH: POSITIVE — AB
OXYCODONE: >3000 ng/mL
OXYCODONE: POSITIVE — AB
OXYMORPHONE (GC/MS): >3000 ng/mL
OXYMORPHONE: POSITIVE — AB

## 2020-01-12 LAB — OPIATES CONFIRMATION, URINE: Opiates: NEGATIVE ng/mL

## 2020-01-14 ENCOUNTER — Ambulatory Visit: Payer: Medicaid Other | Admitting: Family Medicine

## 2020-01-16 ENCOUNTER — Telehealth: Payer: Self-pay | Admitting: Family Medicine

## 2020-01-16 ENCOUNTER — Other Ambulatory Visit: Payer: Self-pay | Admitting: Family Medicine

## 2020-01-16 DIAGNOSIS — G894 Chronic pain syndrome: Secondary | ICD-10-CM

## 2020-01-16 MED ORDER — OXYCODONE HCL 10 MG PO TABS: 10.0000 mg | ORAL_TABLET | ORAL | 0 refills | Status: AC | PRN

## 2020-01-16 NOTE — Progress Notes (Signed)
Reviewed PDMP substance reporting system prior to prescribing opiate medications. No inconsistencies noted.  Meds ordered this encounter  Medications   Oxycodone HCl 10 MG TABS    Sig: Take 1 tablet (10 mg total) by mouth every 4 (four) hours as needed for up to 15 days.    Dispense:  90 tablet    Refill:  0    Order Specific Question:   Supervising Provider    Answer:   [name], [name]   Lindsey [name] Lindsey Sasso  APRN, MSN, FNP-C Patient Care [facility] [address]  [place], [place] [address] [phone]  

## 2020-01-16 NOTE — Telephone Encounter (Signed)
Patient has requested that the Oxycodone be sent to [facility]

## 2020-01-17 ENCOUNTER — Telehealth: Payer: Self-pay | Admitting: Family Medicine

## 2020-01-20 NOTE — Telephone Encounter (Signed)
Sent to [facility] 

## 2020-01-21 NOTE — Telephone Encounter (Signed)
Prior authorization was complete on 01/17/2020 for this medication. Called pharmacy will have to resubmit again

## 2020-01-27 ENCOUNTER — Telehealth: Payer: Self-pay | Admitting: Family Medicine

## 2020-01-27 ENCOUNTER — Other Ambulatory Visit: Payer: Self-pay | Admitting: Family Medicine

## 2020-01-27 DIAGNOSIS — G894 Chronic pain syndrome: Secondary | ICD-10-CM

## 2020-01-27 MED ORDER — OXYCODONE HCL ER 10 MG PO T12A: 10.0000 mg | EXTENDED_RELEASE_TABLET | Freq: Two times a day (BID) | ORAL | 0 refills | Status: DC

## 2020-01-27 NOTE — Progress Notes (Signed)
Reviewed PDMP substance reporting system prior to prescribing opiate medications. No inconsistencies noted.   Meds ordered this encounter  Medications  . oxyCODONE (OXYCONTIN) 10 mg 12 hr tablet    Sig: Take 1 tablet (10 mg total) by mouth every 12 (twelve) hours.    Dispense:  60 tablet    Refill:  0     [name]  APRN, MSN, FNP-C Patient Care [facility] [address] [place], [place] [address] [phone]

## 2020-01-28 NOTE — Telephone Encounter (Signed)
Done

## 2020-02-10 ENCOUNTER — Other Ambulatory Visit: Payer: Self-pay | Admitting: Family Medicine

## 2020-02-10 ENCOUNTER — Telehealth: Payer: Self-pay | Admitting: Family Medicine

## 2020-02-10 DIAGNOSIS — G894 Chronic pain syndrome: Secondary | ICD-10-CM

## 2020-02-10 MED ORDER — OXYCODONE HCL 10 MG PO TABS: 10.0000 mg | ORAL_TABLET | Freq: Four times a day (QID) | ORAL | 0 refills | Status: DC | PRN

## 2020-02-10 NOTE — Progress Notes (Signed)
Reviewed PDMP substance reporting system prior to prescribing opiate medications. No inconsistencies noted.  Meds ordered this encounter  Medications   Oxycodone HCl 10 MG TABS    Sig: Take 1 tablet (10 mg total) by mouth every 6 (six) hours as needed.    Dispense:  60 tablet    Refill:  0    Order Specific Question:   Supervising Provider    Answer:   [name], [name]   Analynn Daum Moore Herald Vallin  APRN, MSN, FNP-C Patient Care [facility] [address]  [place], [place] [address] [phone]  

## 2020-02-10 NOTE — Telephone Encounter (Signed)
Done

## 2020-02-10 NOTE — Progress Notes (Unsigned)
Opened in error

## 2020-02-18 ENCOUNTER — Other Ambulatory Visit: Payer: Self-pay | Admitting: Family Medicine

## 2020-02-18 DIAGNOSIS — D5700 Hb-SS disease with crisis, unspecified: Secondary | ICD-10-CM

## 2020-02-19 ENCOUNTER — Encounter (HOSPITAL_COMMUNITY): Payer: Self-pay | Admitting: Emergency Medicine

## 2020-02-19 ENCOUNTER — Emergency Department (HOSPITAL_COMMUNITY)
Admission: EM | Admit: 2020-02-19 | Discharge: 2020-02-19 | Disposition: A | Discharge status: Home / Self Care | Payer: Medicaid Other | Attending: Emergency Medicine | Admitting: Emergency Medicine

## 2020-02-19 ENCOUNTER — Other Ambulatory Visit: Payer: Self-pay

## 2020-02-19 DIAGNOSIS — Z79899 Other long term (current) drug therapy: Secondary | ICD-10-CM | POA: Diagnosis not present

## 2020-02-19 DIAGNOSIS — F17290 Nicotine dependence, other tobacco product, uncomplicated: Secondary | ICD-10-CM | POA: Insufficient documentation

## 2020-02-19 DIAGNOSIS — M79662 Pain in left lower leg: Secondary | ICD-10-CM | POA: Diagnosis present

## 2020-02-19 DIAGNOSIS — Z7982 Long term (current) use of aspirin: Secondary | ICD-10-CM | POA: Diagnosis not present

## 2020-02-19 DIAGNOSIS — D57219 Sickle-cell/Hb-C disease with crisis, unspecified: Secondary | ICD-10-CM | POA: Insufficient documentation

## 2020-02-19 DIAGNOSIS — D5700 Hb-SS disease with crisis, unspecified: Secondary | ICD-10-CM

## 2020-02-19 LAB — CBC WITH DIFFERENTIAL/PLATELET
Abs Immature Granulocytes: 0.03 10*3/uL (ref 0.00–0.07)
Basophils Absolute: 0.1 10*3/uL (ref 0.0–0.1)
Basophils Relative: 1 %
Eosinophils Absolute: 0.2 10*3/uL (ref 0.0–0.5)
Eosinophils Relative: 2 %
HCT: 29.2 % — ABNORMAL LOW (ref 36.0–46.0)
Hemoglobin: 10.9 g/dL — ABNORMAL LOW (ref 12.0–15.0)
Immature Granulocytes: 0 %
Lymphocytes Relative: 46 %
Lymphs Abs: 4.8 10*3/uL — ABNORMAL HIGH (ref 0.7–4.0)
MCH: 32.2 pg (ref 26.0–34.0)
MCHC: 37.3 g/dL — ABNORMAL HIGH (ref 30.0–36.0)
MCV: 86.1 fL (ref 80.0–100.0)
Monocytes Absolute: 0.5 10*3/uL (ref 0.1–1.0)
Monocytes Relative: 5 %
Neutro Abs: 4.7 10*3/uL (ref 1.7–7.7)
Neutrophils Relative %: 46 %
Platelets: 409 10*3/uL — ABNORMAL HIGH (ref 150–400)
RBC: 3.39 MIL/uL — ABNORMAL LOW (ref 3.87–5.11)
RDW: 14.1 % (ref 11.5–15.5)
WBC: 10.2 10*3/uL (ref 4.0–10.5)
nRBC: 0.4 % — ABNORMAL HIGH (ref 0.0–0.2)

## 2020-02-19 LAB — COMPREHENSIVE METABOLIC PANEL
ALT: 12 U/L (ref 0–44)
AST: 16 U/L (ref 15–41)
Albumin: 4.3 g/dL (ref 3.5–5.0)
Alkaline Phosphatase: 39 U/L (ref 38–126)
Anion gap: 7 (ref 5–15)
BUN: 7 mg/dL (ref 6–20)
CO2: 24 mmol/L (ref 22–32)
Calcium: 9 mg/dL (ref 8.9–10.3)
Chloride: 109 mmol/L (ref 98–111)
Creatinine, Ser: 0.81 mg/dL (ref 0.44–1.00)
GFR, Estimated: >60 mL/min (ref >60)
Glucose, Bld: 97 mg/dL (ref 70–99)
Potassium: 3.8 mmol/L (ref 3.5–5.1)
Sodium: 140 mmol/L (ref 135–145)
Total Bilirubin: 1.4 mg/dL — ABNORMAL HIGH (ref 0.3–1.2)
Total Protein: 7.4 g/dL (ref 6.5–8.1)

## 2020-02-19 LAB — I-STAT BETA HCG BLOOD, ED (MC, WL, AP ONLY): I-stat hCG, quantitative: <5 m[IU]/mL (ref <5)

## 2020-02-19 LAB — RETICULOCYTES
Immature Retic Fract: 27.1 % — ABNORMAL HIGH (ref 2.3–15.9)
RBC.: 3.33 MIL/uL — ABNORMAL LOW (ref 3.87–5.11)
Retic Count, Absolute: 140.9 10*3/uL (ref 19.0–186.0)
Retic Ct Pct: 4.2 % — ABNORMAL HIGH (ref 0.4–3.1)

## 2020-02-19 MED ORDER — SODIUM CHLORIDE 0.45 % IV SOLN: INTRAVENOUS | Status: DC

## 2020-02-19 MED ORDER — OXYCODONE HCL 5 MG PO TABS
10.0000 mg | ORAL_TABLET | Freq: Once | ORAL | Status: AC
  Administered 2020-02-19: 10 mg via ORAL
  Filled 2020-02-19: qty 2

## 2020-02-19 MED ORDER — HYDROMORPHONE HCL 1 MG/ML IJ SOLN
1.0000 mg | Freq: Once | INTRAMUSCULAR | Status: AC
  Administered 2020-02-19: 1 mg via INTRAVENOUS
  Filled 2020-02-19: qty 1

## 2020-02-19 MED ORDER — DIPHENHYDRAMINE HCL 25 MG PO CAPS: 25.0000 mg | ORAL_CAPSULE | ORAL | Status: DC | PRN

## 2020-02-19 MED ORDER — KETOROLAC TROMETHAMINE 30 MG/ML IJ SOLN
30.0000 mg | Freq: Once | INTRAMUSCULAR | Status: AC
  Administered 2020-02-19: 30 mg via INTRAVENOUS
  Filled 2020-02-19: qty 1

## 2020-02-19 MED ORDER — ACETAMINOPHEN 500 MG PO TABS
1000.0000 mg | ORAL_TABLET | Freq: Once | ORAL | Status: AC
  Administered 2020-02-19: 1000 mg via ORAL
  Filled 2020-02-19: qty 2

## 2020-02-19 MED ORDER — KETOROLAC TROMETHAMINE 30 MG/ML IJ SOLN: 60.0000 mg | Freq: Once | INTRAMUSCULAR | Status: DC

## 2020-02-19 MED ORDER — HYDROMORPHONE HCL 2 MG/ML IJ SOLN
2.0000 mg | INTRAMUSCULAR | Status: AC
  Administered 2020-02-19: 2 mg via INTRAVENOUS
  Filled 2020-02-19: qty 1

## 2020-02-19 MED ORDER — ONDANSETRON HCL 4 MG/2ML IJ SOLN: 4.0000 mg | INTRAMUSCULAR | Status: DC | PRN

## 2020-02-19 NOTE — ED Triage Notes (Signed)
Patient complains of SCC x2 weeks, on and off, tearful in triage. States her arms and legs are hurting her, took home medications w/o relief. Denies CP, SOB, N/V/D, endorses a HA.

## 2020-02-19 NOTE — Discharge Instructions (Signed)
I am sorry that you had a significant exacerbation of your sickle cell pain.  I am glad were able to get the pain under control here in the ED.  Your blood work showed no evidence of infection or other serious problem.  Please be sure to follow-up closely with your primary care provider for additional pain management.

## 2020-02-19 NOTE — ED Provider Notes (Signed)
[facility]-EMERGENCY DEPT Provider Note   CSN: [id number] Arrival date & time: 02/19/20  1601     History No chief complaint on file.   Lindsey [name] is a 49 y.o. female.  [name] is a 49 year old woman who presents to the ED with about a week and a half of right lower leg pain concerning for sickle cell pain crisis.  She has a previous medical history significant for sickle cell disease and chronic pain.  She reports that she has chronic pain in her right lower leg for which she regularly sees her internal medicine doctor.  This right lower leg pain is often the source of her pain crises.  She began having worsening right leg pain about a week and a half ago and was initially able to controlled at home.  She denies any specific trauma to her right lower leg.  She reports that she was able to take Tylenol and hydrocodone 10 mg every 4 hours at home with good improvement in her pain and resolution of about 4 days ago.  Over the weekend, it seemed that her right lower leg pain returned and she was not able to controlled at home anymore.  She is continue to take Tylenol and oxycodone without significant improvement.  She is coming today because her pain is horribly controlled.  On review of systems, she did note that she had occasional pain with urination.  She specifically denies fever, congestion, sore throat, cough, shortness of breath, chest pain, abdominal pain, nausea, rash, bruising.        Past Medical History:  Diagnosis Date  . 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 Opioid dependence in remission (HCC)   . Sickle cell crisis [facility](HCC)     Patient Active Problem List   Diagnosis Date Noted  . Acute pain of left shoulder   . Right leg pain   . Sickle cell anemia with crisis (HCC) 09/10/2019  . Anemia of chronic disease 09/10/2019  . Current mild episode of major depressive disorder (HCC)   . Sickle cell crisis (HCC) 02/28/2019  . Opioid dependence  in remission (HCC) 10/01/2017  . Sickle cell disease (HCC) 10/01/2017  . Chronic pain 10/01/2017  . 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 Leukocytosis 09/03/2016  . Sickle cell pain crisis (HCC) 07/20/2012  . 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 APPENDECTOMY    . CESAREAN SECTION    . OTHER SURGICAL HISTORY     c-section     OB History   No obstetric history on file.     Family History  Problem Relation Age of Onset  . Stroke Neg Hx        none that she knows of   . Seizures Neg Hx     Social History   Tobacco Use  . Smoking status: Current Some Day Smoker    Packs/day: 0.00    Types: Cigars  . Smokeless tobacco: Never Used  Vaping Use  . Vaping Use: Former  Substance Use Topics  . Alcohol use: Yes    Comment: occasionally  . Drug use: No    Home Medications Prior to Admission medications   Medication Sig Start Date End Date Taking? Authorizing Provider  Aspirin-Acetaminophen-Caffeine (GOODY HEADACHE PO) Take 1 packet by mouth every 8 (eight) hours as needed (pain/headache).     [provider]  ergocalciferol (VITAMIN D2) 1.25 MG (50000 UT)  capsule Take 1 capsule (50,000 Units total) by mouth once a week. Patient taking differently: Take 50,000 Units by mouth every Wednesday.  10/16/19   Massie Maroon, FNP  gabapentin (NEURONTIN) 300 MG capsule TAKE 1 CAPSULE BY MOUTH THREE TIMES DAILY 02/19/20   Massie Maroon, FNP  hydrOXYzine (ATARAX/VISTARIL) 25 MG tablet Take 1 tablet (25 mg total) by mouth at bedtime as needed. Patient taking differently: Take 25 mg by mouth at bedtime.  11/27/19   Massie Maroon, FNP  ibuprofen (ADVIL) 800 MG tablet TAKE 1 TABLET(800 MG) BY MOUTH EVERY 8 HOURS AS NEEDED Patient taking differently: Take 800 mg by mouth every 8 (eight) hours as needed for moderate pain.  10/14/19   Massie Maroon, FNP  Melatonin 10 MG TABS Take 10 mg by mouth at bedtime.     [provider]  oxyCODONE (OXYCONTIN) 10 mg 12 hr tablet Take 1 tablet (10 mg total) by mouth every 12 (twelve) hours. 01/30/20   Massie Maroon, FNP  Oxycodone HCl 10 MG TABS Take 1 tablet (10 mg total) by mouth every 6 (six) hours as needed. 02/10/20   Massie Maroon, FNP    Allergies    Patient has no known allergies.  Review of Systems   Review of Systems  Constitutional: Negative for chills, fatigue and fever.  HENT: Negative for congestion and sore throat.   Respiratory: Negative for cough, chest tightness, shortness of breath and wheezing.   Cardiovascular: Negative for chest pain and palpitations.  Gastrointestinal: Negative for abdominal distention, abdominal pain, diarrhea, nausea and vomiting.  Genitourinary: Positive for dysuria.  Musculoskeletal: Positive for myalgias.  Neurological: Positive for headaches. Negative for tremors and weakness.  Psychiatric/Behavioral: Negative for confusion.    Physical Exam Updated Vital Signs BP 109/83 (BP Location: Left Arm)   Pulse 74   Temp 98.5 F (36.9 C) (Oral)   Resp 18   Ht 5' (1.524 m)   Wt 65.8 kg   SpO2 100%   BMI 28.32 kg/m   Physical Exam Constitutional:      General: She is in acute distress.     Appearance: She is normal weight. She is not toxic-appearing.     Comments: Curled up in bed in the fetal position in obvious discomfort.  Tearful on exam.  HENT:     Head: Normocephalic and atraumatic.     Right Ear: External ear normal.     Left Ear: External ear normal.     Nose: Nose normal. No congestion.     Mouth/Throat:     Mouth: Mucous membranes are moist.     Pharynx: Oropharynx is clear. No oropharyngeal exudate.  Eyes:     General: No scleral icterus.    Conjunctiva/sclera: Conjunctivae normal.     Pupils: Pupils are equal, round, and reactive to light.  Cardiovascular:     Rate and Rhythm: Normal rate and regular rhythm.     Pulses: Normal pulses.     Heart sounds: Normal heart  sounds.  Pulmonary:     Effort: Pulmonary effort is normal. No respiratory distress.     Breath sounds: Normal breath sounds. No wheezing or rales.  Abdominal:     General: Abdomen is flat. Bowel sounds are normal. There is no distension.     Tenderness: There is no abdominal tenderness. There is no right CVA tenderness, left CVA tenderness or guarding.  Musculoskeletal:        General: No swelling or deformity.  Cervical back: Normal range of motion and neck supple.     Right lower leg: No edema.     Comments: Right lower extremity showed no evidence of significant swelling or ecchymosis.  She had normal range of motion at her right knee and right ankle.  There was significant tenderness to palpation over the right calf.  Skin:    General: Skin is warm and dry.     Capillary Refill: Capillary refill takes less than 2 seconds.     Coloration: Skin is not jaundiced.     Findings: No bruising.  Neurological:     General: No focal deficit present.     Mental Status: She is alert. Mental status is at baseline.     Cranial Nerves: No cranial nerve deficit.     Motor: No weakness.  Psychiatric:        Mood and Affect: Mood normal.     ED Results / Procedures / Treatments   Labs (all labs ordered are listed, but only abnormal results are displayed) Labs Reviewed  COMPREHENSIVE METABOLIC PANEL - Abnormal; Notable for the following components:      Result Value   Total Bilirubin 1.4 (*)    All other components within normal limits  CBC WITH DIFFERENTIAL/PLATELET - Abnormal; Notable for the following components:   RBC 3.39 (*)    Hemoglobin 10.9 (*)    HCT 29.2 (*)    MCHC 37.3 (*)    Platelets 409 (*)    nRBC 0.4 (*)    Lymphs Abs 4.8 (*)    All other components within normal limits  RETICULOCYTES - Abnormal; Notable for the following components:   Retic Ct Pct 4.2 (*)    RBC. 3.33 (*)    Immature Retic Fract 27.1 (*)    All other components within normal limits  I-STAT  BETA HCG BLOOD, ED ([facility], [facility], [facility] ONLY)    EKG None  Radiology No results found.  Procedures Procedures (including critical care time)  Medications Ordered in ED Medications  acetaminophen (TYLENOL) tablet 1,000 mg (has no administration in time range)  ketorolac (TORADOL) 30 MG/ML injection 60 mg (has no administration in time range)  HYDROmorphone (DILAUDID) injection 1 mg (has no administration in time range)    ED Course  I have reviewed the triage vital signs and the nursing notes.  Pertinent labs & imaging results that were available during my care of the patient were reviewed by me and considered in my medical decision making (see chart for details).    MDM Rules/Calculators/A&P                          Ms. Kroboth is a 49 year old woman who presents to the ED with about a week and a half of right lower leg pain concerning for sickle cell pain crisis.  She has a previous medical history significant for sickle cell disease and chronic pain.  Her history and presentation I consistent with sickle cell pain crisis.  Low suspicion for infectious etiology or trigger at this time.  We will get a UA to rule out possible urinary tract infection but she is not endorsing any evidence of fever, shortness of breath or concerning symptoms of infection.  CBC also is notable for a normal white blood cell count.  No concern for acute chest syndrome at this time other acute sequelae of sickle cell disease.  For now, we will attempt to control her pain  in the ED with Tylenol, Toradol and Dilaudid.  Her pain was ultimately well controlled in the emergency department following 3 doses of Dilaudid in addition to Tylenol and Toradol.  She was discharged from the emergency room significantly improved and was encouraged to follow-up with her PCP for ongoing care of her sickle cell disease.  Final Clinical Impression(s) / ED Diagnoses Final diagnoses:  None    Rx / DC Orders ED Discharge Orders     None       Mirian Mo, MD 02/19/20 [id number]    Alvira Monday, MD 02/22/20 2119

## 2020-02-20 ENCOUNTER — Telehealth: Payer: Self-pay | Admitting: Family Medicine

## 2020-02-20 ENCOUNTER — Other Ambulatory Visit: Payer: Self-pay | Admitting: Family Medicine

## 2020-02-20 DIAGNOSIS — G894 Chronic pain syndrome: Secondary | ICD-10-CM

## 2020-02-20 MED ORDER — OXYCODONE HCL 10 MG PO TABS: 10.0000 mg | ORAL_TABLET | Freq: Four times a day (QID) | ORAL | 0 refills | Status: DC | PRN

## 2020-02-20 NOTE — Progress Notes (Signed)
Reviewed PDMP substance reporting system prior to prescribing opiate medications. No inconsistencies noted.  Meds ordered this encounter  Medications   Oxycodone HCl 10 MG TABS    Sig: Take 1 tablet (10 mg total) by mouth every 6 (six) hours as needed.    Dispense:  60 tablet    Refill:  0    Order Specific Question:   Supervising Provider    Answer:   [name], [name]   Lindsey Solanki Moore Jearldine Cassady  APRN, MSN, FNP-C [facility] [address]  [place], [place] [address] [phone]  

## 2020-02-21 NOTE — Telephone Encounter (Signed)
Done

## 2020-02-29 ENCOUNTER — Other Ambulatory Visit: Payer: Self-pay | Admitting: Nurse Practitioner

## 2020-02-29 DIAGNOSIS — G894 Chronic pain syndrome: Secondary | ICD-10-CM

## 2020-02-29 MED ORDER — OXYCODONE HCL ER 10 MG PO T12A: 10.0000 mg | EXTENDED_RELEASE_TABLET | Freq: Two times a day (BID) | ORAL | 0 refills | Status: DC

## 2020-02-29 NOTE — Progress Notes (Signed)
° °  [facility] Patient [facility] [address] [place], [place]  [address] Phone:  [phone]   Fax:  [phone]  PDMP reviewed during this encounter.Subjective:    Lindsey [name] calls the answering service for a refill of her Oxycodone ER.    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

## 2020-03-06 ENCOUNTER — Telehealth: Payer: Self-pay | Admitting: Family Medicine

## 2020-03-06 DIAGNOSIS — G894 Chronic pain syndrome: Secondary | ICD-10-CM

## 2020-03-06 MED ORDER — XTAMPZA ER 9 MG PO C12A: 9.0000 mg | EXTENDED_RELEASE_CAPSULE | Freq: Two times a day (BID) | ORAL | 0 refills | Status: DC

## 2020-03-06 NOTE — Telephone Encounter (Signed)
Reviewed PDMP substance reporting system prior to prescribing opiate medications. No inconsistencies noted.  Meds ordered this encounter  Medications   oxyCODONE ER (XTAMPZA ER) 9 MG C12A    Sig: Take 9 mg by mouth every 12 (twelve) hours.    Dispense:  60 capsule    Refill:  0    Order Specific Question:   Supervising Provider    Answer:   [name], [name]   Lindsey Gladwin Moore Marsa Matteo  APRN, MSN, FNP-C [facility] [address]  [place], [place] [address] [phone]  

## 2020-03-10 ENCOUNTER — Telehealth: Payer: Self-pay | Admitting: Family Medicine

## 2020-03-10 ENCOUNTER — Other Ambulatory Visit: Payer: Self-pay | Admitting: Family Medicine

## 2020-03-10 DIAGNOSIS — G894 Chronic pain syndrome: Secondary | ICD-10-CM

## 2020-03-10 MED ORDER — OXYCODONE HCL 10 MG PO TABS: 10.0000 mg | ORAL_TABLET | Freq: Four times a day (QID) | ORAL | 0 refills | Status: DC | PRN

## 2020-03-10 NOTE — Progress Notes (Signed)
Reviewed PDMP substance reporting system prior to prescribing opiate medications. No inconsistencies noted.  Meds ordered this encounter  Medications   Oxycodone HCl 10 MG TABS    Sig: Take 1 tablet (10 mg total) by mouth every 6 (six) hours as needed.    Dispense:  60 tablet    Refill:  0    Order Specific Question:   Supervising Provider    Answer:   [name], [name]   Nedda Gains Moore Kyzen Horn  APRN, MSN, FNP-C [facility] [address]  [place], [place] [address] [phone]  

## 2020-03-11 ENCOUNTER — Other Ambulatory Visit: Payer: Self-pay | Admitting: Family Medicine

## 2020-03-11 ENCOUNTER — Telehealth: Payer: Self-pay | Admitting: Family Medicine

## 2020-03-11 DIAGNOSIS — D5700 Hb-SS disease with crisis, unspecified: Secondary | ICD-10-CM

## 2020-03-11 MED ORDER — GABAPENTIN 300 MG PO CAPS: 300.0000 mg | ORAL_CAPSULE | Freq: Three times a day (TID) | ORAL | 1 refills | Status: DC

## 2020-03-11 NOTE — Progress Notes (Signed)
Meds ordered this encounter  Medications  . gabapentin (NEURONTIN) 300 MG capsule    Sig: Take 1 capsule (300 mg total) by mouth 3 (three) times daily.    Dispense:  90 capsule    Refill:  1    Order Specific Question:   Supervising Provider    Answer:   [name], [name]    Thurmond Hildebran Moore Chayne Baumgart  APRN, MSN, FNP-C [facility] [address]  [place], [place] [address] [phone]  

## 2020-03-12 NOTE — Telephone Encounter (Signed)
Done

## 2020-03-25 ENCOUNTER — Telehealth: Payer: Self-pay | Admitting: Family Medicine

## 2020-03-26 ENCOUNTER — Other Ambulatory Visit: Payer: Self-pay

## 2020-03-26 ENCOUNTER — Other Ambulatory Visit: Payer: Self-pay | Admitting: Nurse Practitioner

## 2020-03-26 DIAGNOSIS — G894 Chronic pain syndrome: Secondary | ICD-10-CM

## 2020-03-26 MED ORDER — OXYCODONE HCL 10 MG PO TABS: 10.0000 mg | ORAL_TABLET | Freq: Four times a day (QID) | ORAL | 0 refills | Status: DC | PRN

## 2020-03-26 NOTE — Progress Notes (Signed)
   [facility] Patient [facility] [address] [place], [place]  [address] Phone:  [phone]   Fax:  [phone]  Oxycodone 10 mg #60 was resent to [facility] employee per patient request previous prescription to [facility] on [address]  has been retracted via phone call to the pharmacy

## 2020-03-27 NOTE — Telephone Encounter (Signed)
Done

## 2020-04-07 ENCOUNTER — Encounter: Payer: Self-pay | Admitting: Family Medicine

## 2020-04-07 ENCOUNTER — Ambulatory Visit (HOSPITAL_COMMUNITY)
Admission: RE | Admit: 2020-04-07 | Discharge: 2020-04-07 | Disposition: A | Discharge status: Home / Self Care | Payer: Medicaid Other | Source: Ambulatory Visit | Attending: Internal Medicine | Admitting: Internal Medicine

## 2020-04-07 ENCOUNTER — Other Ambulatory Visit: Payer: Self-pay

## 2020-04-07 ENCOUNTER — Telehealth: Payer: Self-pay | Admitting: Clinical

## 2020-04-07 ENCOUNTER — Ambulatory Visit (INDEPENDENT_AMBULATORY_CARE_PROVIDER_SITE_OTHER): Payer: Medicaid Other | Admitting: Family Medicine

## 2020-04-07 VITALS — BP 127/80 | HR 94 | Temp 99.3°F | Resp 16 | Ht 60.0 in | Wt 140.8 lb

## 2020-04-07 DIAGNOSIS — E860 Dehydration: Secondary | ICD-10-CM

## 2020-04-07 DIAGNOSIS — D571 Sickle-cell disease without crisis: Secondary | ICD-10-CM | POA: Diagnosis not present

## 2020-04-07 DIAGNOSIS — G894 Chronic pain syndrome: Secondary | ICD-10-CM | POA: Diagnosis not present

## 2020-04-07 DIAGNOSIS — E559 Vitamin D deficiency, unspecified: Secondary | ICD-10-CM

## 2020-04-07 LAB — CBC WITH DIFFERENTIAL/PLATELET
Abs Immature Granulocytes: 0.02 10*3/uL (ref 0.00–0.07)
Basophils Absolute: 0.1 10*3/uL (ref 0.0–0.1)
Basophils Relative: 1 %
Eosinophils Absolute: 0.3 10*3/uL (ref 0.0–0.5)
Eosinophils Relative: 3 %
HCT: 28.3 % — ABNORMAL LOW (ref 36.0–46.0)
Hemoglobin: 10.6 g/dL — ABNORMAL LOW (ref 12.0–15.0)
Immature Granulocytes: 0 %
Lymphocytes Relative: 19 %
Lymphs Abs: 1.9 10*3/uL (ref 0.7–4.0)
MCH: 31.8 pg (ref 26.0–34.0)
MCHC: 37.5 g/dL — ABNORMAL HIGH (ref 30.0–36.0)
MCV: 85 fL (ref 80.0–100.0)
Monocytes Absolute: 0.4 10*3/uL (ref 0.1–1.0)
Monocytes Relative: 4 %
Neutro Abs: 7.4 10*3/uL (ref 1.7–7.7)
Neutrophils Relative %: 73 %
Platelets: 422 10*3/uL — ABNORMAL HIGH (ref 150–400)
RBC: 3.33 MIL/uL — ABNORMAL LOW (ref 3.87–5.11)
RDW: 14.4 % (ref 11.5–15.5)
WBC: 10.1 10*3/uL (ref 4.0–10.5)
nRBC: 0.2 % (ref 0.0–0.2)

## 2020-04-07 LAB — COMPREHENSIVE METABOLIC PANEL
ALT: 12 U/L (ref 0–44)
AST: 18 U/L (ref 15–41)
Albumin: 4.5 g/dL (ref 3.5–5.0)
Alkaline Phosphatase: 38 U/L (ref 38–126)
Anion gap: 7 (ref 5–15)
BUN: 11 mg/dL (ref 6–20)
CO2: 25 mmol/L (ref 22–32)
Calcium: 9.1 mg/dL (ref 8.9–10.3)
Chloride: 108 mmol/L (ref 98–111)
Creatinine, Ser: 0.77 mg/dL (ref 0.44–1.00)
GFR, Estimated: >60 mL/min (ref >60)
Glucose, Bld: 94 mg/dL (ref 70–99)
Potassium: 4.2 mmol/L (ref 3.5–5.1)
Sodium: 140 mmol/L (ref 135–145)
Total Bilirubin: 1 mg/dL (ref 0.3–1.2)
Total Protein: 7.5 g/dL (ref 6.5–8.1)

## 2020-04-07 LAB — POCT URINALYSIS DIPSTICK
Bilirubin, UA: NEGATIVE
Blood, UA: NEGATIVE
Glucose, UA: NEGATIVE
Ketones, UA: NEGATIVE
Nitrite, UA: NEGATIVE
Protein, UA: NEGATIVE
Spec Grav, UA: 1.02 (ref 1.010–1.025)
Urobilinogen, UA: 0.2 E.U./dL
pH, UA: 5 (ref 5.0–8.0)

## 2020-04-07 MED ORDER — SODIUM CHLORIDE 0.45 % IV BOLUS
1000.0000 mL | Freq: Once | INTRAVENOUS | Status: AC
  Administered 2020-04-07: 1000 mL via INTRAVENOUS

## 2020-04-07 NOTE — Telephone Encounter (Signed)
Integrated Behavioral Health Case Management Referral Note  04/07/2020 Name: Lindsey [name] MRN: [id number] DOB: 1970/08/06 Lindsey [name] is a 49 y.o. year old female who sees Lindsey Maroon, FNP for primary care. LCSW was consulted to assess patient's needs and assist the patient with Mental Health Counseling and Resources.  Interpreter: No.   Interpreter Name & Language: none  Assessment: Patient experiencing Mental Health Concerns . Patient experiencing anxiety.  Intervention: CSW unable to meet with patient during appointment here at the [facility]) today. Called patient this afternoon to follow up on referral from PCP. Patient is interested in mental health counseling with this CSW. Scheduled Integrated Behavioral Health (IBH) follow up with patient in clinic for next week.   Review of patient status, including review of consultants reports, relevant laboratory and other test results, and collaboration with appropriate care team members and the patient's provider was performed as part of comprehensive patient evaluation and provision of services.    SDOH (Social Determinants of Health) assessments performed: No  Outpatient Encounter Medications as of 04/07/2020  Medication Sig Note  . Aspirin-Acetaminophen-Caffeine (GOODY HEADACHE PO) Take 1 packet by mouth every 8 (eight) hours as needed (pain/headache).    . ergocalciferol (VITAMIN D2) 1.25 MG (50000 UT) capsule Take 1 capsule (50,000 Units total) by mouth once a week. (Patient taking differently: Take 50,000 Units by mouth every Wednesday. )   . gabapentin (NEURONTIN) 300 MG capsule Take 1 capsule (300 mg total) by mouth 3 (three) times daily.   . hydrOXYzine (ATARAX/VISTARIL) 25 MG tablet Take 1 tablet (25 mg total) by mouth at bedtime as needed. (Patient not taking: Reported on 02/19/2020)   . ibuprofen (ADVIL) 800 MG tablet TAKE 1 TABLET(800 MG) BY MOUTH EVERY 8 HOURS AS NEEDED (Patient taking differently: Take 800 mg  by mouth every 8 (eight) hours as needed for moderate pain. ) 02/19/2020: Need refills  . Melatonin 10 MG TABS Take 10 mg by mouth at bedtime.   . Oxycodone HCl 10 MG TABS Take 1 tablet (10 mg total) by mouth every 6 (six) hours as needed.    No facility-administered encounter medications on file as of 04/07/2020.    Goals Addressed   None      Follow up Plan: 1. IBH assessment 04/16/20  Abigail Butts, LCSW [facility] [phone]

## 2020-04-07 NOTE — Discharge Instructions (Signed)
Patient hydrated with 0.45% sodium chloride and labs drawn.

## 2020-04-07 NOTE — Progress Notes (Signed)
PATIENT CARE CENTER NOTE  Diagnosis: Sickle Cell Anemia    Provider: Hollis, [place], FNP   Procedure: Fluid bolus and lab draw   Note: Patient received a liter fluid bolus of 0.45% Sodium Chloride over 1 hours. Labs drawn pre-bolus (CBC and CMP). Patient tolerated well. Vital signs remained stable. Discharge instructions given. Patient alert, oriented and ambulatory at discharge.

## 2020-04-07 NOTE — Progress Notes (Signed)
[facility]  Established Patient Office Visit  Subjective:  Patient ID: Lindsey [name], female    DOB: [birth year]  Age: 49 y.o. MRN: [id number]  CC:  Chief Complaint  Patient presents with  . Follow-up    HPI Lindsey [name] is a 49 year old female with a medical history significant for sickle cell disease, chronic pain syndrome, opiate dependence and tolerance, tobacco dependence, and history of anemia of chronic disease presents for follow-up of sickle cell and medication management.  Patient says that she has been having increased pain over the past several weeks.  Patient recently completed a drug rehabilitation at [facility].  Patient attended drug rehabilitation to decreased tolerance of opiates.  She states that medication use have is improved since drug rehabilitation.  Patient is requesting further counseling.  Current pain intensity is 4/10 primarily to lower extremities.  Patient last had oxycodone on last night without sustained relief.  She denies any headache, subjective fever, chills, shortness of breath, nausea, vomiting, or diarrhea.  No sick contacts, recent travel, or exposure to COVID-19.  Of note, patient has not been vaccinated against COVID-19.  Patient says that she feels as if she is dehydrated.  Past Medical History:  Diagnosis Date  . 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 Opioid dependence in remission (HCC)   . Sickle cell crisis [facility])     Past Surgical History:  Procedure Laterality Date  . APPENDECTOMY    . CESAREAN SECTION    . OTHER SURGICAL HISTORY     c-section    Family History  Problem Relation Age of Onset  . Stroke Neg Hx        none that she knows of   . Seizures Neg Hx     Social History   Socioeconomic History  . Marital status: Married    Spouse name: Not on file  . Number of children: 2  . Years of education: Not on file  . Highest education level: Not on file  Occupational History   . Not on file  Tobacco Use  . Smoking status: Current Some Day Smoker    Packs/day: 0.00    Types: Cigars  . Smokeless tobacco: Never Used  Vaping Use  . Vaping Use: Former  Substance and Sexual Activity  . Alcohol use: Yes    Comment: occasionally  . Drug use: No  . Sexual activity: Yes    Birth control/protection: None  Other Topics Concern  . Not on file  Social History Narrative   Lives at home with husband and son   Right handed   Social Determinants of Health   Financial Resource Strain:   . Difficulty of Paying Living Expenses: Not on file  Food Insecurity:   . Worried About Programme researcher, broadcasting/film/video in the Last Year: Not on file  . Ran Out of Food in the Last Year: Not on file  Transportation Needs:   . Lack of Transportation (Medical): Not on file  . Lack of Transportation (Non-Medical): Not on file  Physical Activity:   . Days of Exercise per Week: Not on file  . Minutes of Exercise per Session: Not on file  Stress:   . Feeling of Stress : Not on file  Social Connections:   . Frequency of Communication with Friends and Family: Not on file  . Frequency of Social Gatherings with Friends and Family: Not on file  . Attends Religious Services: Not on file  .  Active Member of Clubs or Organizations: Not on file  . Attends Banker Meetings: Not on file  . Marital Status: Not on file  Intimate Partner Violence:   . Fear of Current or Ex-Partner: Not on file  . Emotionally Abused: Not on file  . Physically Abused: Not on file  . Sexually Abused: Not on file    Outpatient Medications Prior to Visit  Medication Sig Dispense Refill  . Aspirin-Acetaminophen-Caffeine (GOODY HEADACHE PO) Take 1 packet by mouth every 8 (eight) hours as needed (pain/headache).     . ergocalciferol (VITAMIN D2) 1.25 MG (50000 UT) capsule Take 1 capsule (50,000 Units total) by mouth once a week. (Patient taking differently: Take 50,000 Units by mouth every Wednesday. ) 12 capsule 1   . gabapentin (NEURONTIN) 300 MG capsule Take 1 capsule (300 mg total) by mouth 3 (three) times daily. 90 capsule 1  . hydrOXYzine (ATARAX/VISTARIL) 25 MG tablet Take 1 tablet (25 mg total) by mouth at bedtime as needed. (Patient not taking: Reported on 02/19/2020) 30 tablet 1  . ibuprofen (ADVIL) 800 MG tablet TAKE 1 TABLET(800 MG) BY MOUTH EVERY 8 HOURS AS NEEDED (Patient taking differently: Take 800 mg by mouth every 8 (eight) hours as needed for moderate pain. ) 60 tablet 5  . Melatonin 10 MG TABS Take 10 mg by mouth at bedtime.    . Oxycodone HCl 10 MG TABS Take 1 tablet (10 mg total) by mouth every 6 (six) hours as needed. 60 tablet 0   No facility-administered medications prior to visit.    No Known Allergies  ROS Review of Systems  Constitutional: Negative for activity change and appetite change.  HENT: Negative.   Eyes: Negative.   Respiratory: Negative.   Cardiovascular: Negative.   Gastrointestinal: Negative.   Endocrine: Negative.   Genitourinary: Negative.   Neurological: Negative.   Hematological: Negative.   Psychiatric/Behavioral: Negative.       Objective:    Physical Exam Constitutional:      Appearance: Normal appearance.  HENT:     Head: Normocephalic.     Mouth/Throat:     Mouth: Mucous membranes are moist.     Pharynx: Oropharynx is clear.  Eyes:     General: No scleral icterus.       Left eye: No discharge.     Pupils: Pupils are equal, round, and reactive to light.  Cardiovascular:     Rate and Rhythm: Normal rate and regular rhythm.     Pulses: Normal pulses.     Heart sounds: Normal heart sounds.  Pulmonary:     Effort: Pulmonary effort is normal.     Breath sounds: Normal breath sounds.  Abdominal:     General: Abdomen is flat. Bowel sounds are normal.     Palpations: Abdomen is soft.  Musculoskeletal:     Cervical back: Normal range of motion.  Skin:    General: Skin is warm.  Neurological:     General: No focal deficit present.      Mental Status: She is alert and oriented to person, place, and time. Mental status is at baseline.  Psychiatric:        Mood and Affect: Mood normal.        Thought Content: Thought content normal.        Judgment: Judgment normal.     There were no vitals taken for this visit. Wt Readings from Last 3 Encounters:  02/19/20 145 lb (65.8 kg)  01/07/20 145  lb (65.8 kg)  12/15/19 152 lb (68.9 kg)     Health Maintenance Due  Topic Date Due  . Hepatitis C Screening  Never done  . COVID-19 Vaccine (1) Never done  . PAP SMEAR-Modifier  Never done    There are no preventive care reminders to display for this patient.  Lab Results  Component Value Date   TSH 2.480 02/20/2019   Lab Results  Component Value Date   WBC 10.2 02/19/2020   HGB 10.9 (L) 02/19/2020   HCT 29.2 (L) 02/19/2020   MCV 86.1 02/19/2020   PLT 409 (H) 02/19/2020   Lab Results  Component Value Date   NA 140 02/19/2020   K 3.8 02/19/2020   CO2 24 02/19/2020   GLUCOSE 97 02/19/2020   BUN 7 02/19/2020   CREATININE 0.81 02/19/2020   BILITOT 1.4 (H) 02/19/2020   ALKPHOS 39 02/19/2020   AST 16 02/19/2020   ALT 12 02/19/2020   PROT 7.4 02/19/2020   ALBUMIN 4.3 02/19/2020   CALCIUM 9.0 02/19/2020   ANIONGAP 7 02/19/2020   Lab Results  Component Value Date   CHOL  07/25/2007    119        ATP III CLASSIFICATION:  <200     mg/dL   Desirable  272-536  mg/dL   Borderline High  >=644    mg/dL   High   Lab Results  Component Value Date   HDL 33 (L) 07/25/2007   Lab Results  Component Value Date   LDLCALC  07/25/2007    78        Total Cholesterol/HDL:CHD Risk Coronary Heart Disease Risk Table                     Men   Women  1/2 Average Risk   3.4   3.3   Lab Results  Component Value Date   TRIG 38 07/25/2007   Lab Results  Component Value Date   CHOLHDL 3.6 07/25/2007   No results found for: HGBA1C    Assessment & Plan:   Problem List Items Addressed This Visit      Other   Chronic  pain - Primary   Relevant Orders   [id number] 11+Oxyco+Alc+Crt-Bund    Other Visit Diagnoses    Hb-SS disease without crisis (HCC)       Relevant Orders   Sickle Cell Panel   Urinalysis Dipstick   Vitamin D deficiency       Relevant Orders   Sickle Cell Panel     1. Chronic pain syndrome Pain controlled on current medication regimen, no changes warranted on today. - [id number] 11+Oxyco+Alc+Crt-Bund  2. Hb-SS disease without crisis [facility]) Patient will transition to sickle cell day infusion clinic for hydration.  Will review CBC with differential and complete metabolic panel as results become available. Patient is not on any disease modifying agents at this time. We will continue folic acid 1 mg daily. - Urinalysis Dipstick  3. Vitamin D deficiency Continue vitamin D weekly  4. Dehydration Patient will transition to sickle cell day infusion clinic for IV fluid bolus.  0.45% saline, 1 L.   Follow-up: Return in about 3 months (around 07/06/2020) for sickle cell anemia.    [name]  APRN, MSN, FNP-C Patient Care [facility] [address] [place], [place] [address] [phone]

## 2020-04-08 ENCOUNTER — Telehealth: Payer: Self-pay

## 2020-04-08 ENCOUNTER — Telehealth: Payer: Self-pay | Admitting: Family Medicine

## 2020-04-08 ENCOUNTER — Other Ambulatory Visit: Payer: Self-pay | Admitting: Family Medicine

## 2020-04-08 DIAGNOSIS — G894 Chronic pain syndrome: Secondary | ICD-10-CM

## 2020-04-08 MED ORDER — OXYCODONE HCL ER 10 MG PO T12A: 10.0000 mg | EXTENDED_RELEASE_TABLET | Freq: Two times a day (BID) | ORAL | 0 refills | Status: DC

## 2020-04-08 MED ORDER — OXYCODONE HCL 10 MG PO TABS: 10.0000 mg | ORAL_TABLET | Freq: Four times a day (QID) | ORAL | 0 refills | Status: DC | PRN

## 2020-04-08 NOTE — Telephone Encounter (Signed)
-----   Message from Massie Maroon, [place] sent at 04/07/2020  4:49 PM EST ----- Regarding: lab results Please inform patient that all laboratory values were consistent with her baseline.  No medication changes warranted at this time.  Continue to hydrate consistently and take medications as prescribed.  Follow-up in clinic as scheduled.  [name]  APRN, MSN, FNP-C [facility] [address] [place], [place] [address] [phone]

## 2020-04-08 NOTE — Telephone Encounter (Signed)
Pt aware of results and voiced understanding, all questions answered/concerns addressed. Pt requests refill on 2 pain meds (short acting and extended release) to be sent to [facility], only 1 pain med listed on med list, pls review and send refills.

## 2020-04-08 NOTE — Progress Notes (Signed)
Reviewed PDMP substance reporting system prior to prescribing opiate medications. No inconsistencies noted.  Meds ordered this encounter  Medications   Oxycodone HCl 10 MG TABS    Sig: Take 1 tablet (10 mg total) by mouth every 6 (six) hours as needed.    Dispense:  60 tablet    Refill:  0    Order Specific Question:   Supervising Provider    Answer:   [name], [name]   Dodge Ator Moore Stefany Starace  APRN, MSN, FNP-C [facility] [address]  [place], [place] [address] [phone]  

## 2020-04-09 NOTE — Telephone Encounter (Signed)
Done

## 2020-04-13 LAB — DRUG SCREEN 764883 11+OXYCO+ALC+CRT-BUND

## 2020-04-15 ENCOUNTER — Other Ambulatory Visit: Payer: Self-pay | Admitting: Family Medicine

## 2020-04-15 DIAGNOSIS — G894 Chronic pain syndrome: Secondary | ICD-10-CM

## 2020-04-15 MED ORDER — OXYCODONE HCL ER 10 MG PO T12A: 10.0000 mg | EXTENDED_RELEASE_TABLET | Freq: Two times a day (BID) | ORAL | 0 refills | Status: DC

## 2020-04-15 NOTE — Progress Notes (Signed)
Meds ordered this encounter  Medications  . oxyCODONE (OXYCONTIN) 10 mg 12 hr tablet    Sig: Take 1 tablet (10 mg total) by mouth every 12 (twelve) hours.    Dispense:  60 tablet    Refill:  0    Order Specific Question:   Supervising Provider    Answer:   Quentin Angst [[id number]]     [name]  APRN, MSN, FNP-C Patient Care [facility] [address] [place], [place] [address] [phone]

## 2020-04-16 ENCOUNTER — Institutional Professional Consult (permissible substitution): Payer: Medicaid Other | Admitting: Clinical

## 2020-04-17 LAB — DRUG SCREEN 764883 11+OXYCO+ALC+CRT-BUND
Amphetamines, Urine: NEGATIVE ng/mL
BENZODIAZ UR QL: NEGATIVE ng/mL
Barbiturate: NEGATIVE ng/mL
Cannabinoid Quant, Ur: NEGATIVE ng/mL
Cocaine (Metabolite): NEGATIVE ng/mL
Creatinine: 96.4 mg/dL (ref 20.0–300.0)
Ethanol: NEGATIVE %
Meperidine: NEGATIVE ng/mL
Methadone Screen, Urine: NEGATIVE ng/mL
Phencyclidine: NEGATIVE ng/mL
Propoxyphene: NEGATIVE ng/mL
Tramadol: NEGATIVE ng/mL
pH, Urine: 5.3 (ref 4.5–8.9)

## 2020-04-17 LAB — OXYCODONE/OXYMORPHONE, CONFIRM
OXYCODONE/OXYMORPH: POSITIVE — AB
OXYCODONE: 841 ng/mL
OXYCODONE: POSITIVE — AB
OXYMORPHONE (GC/MS): >3000 ng/mL
OXYMORPHONE: POSITIVE — AB

## 2020-04-17 LAB — OPIATES CONFIRMATION, URINE: Opiates: NEGATIVE ng/mL

## 2020-04-20 ENCOUNTER — Inpatient Hospital Stay (HOSPITAL_COMMUNITY)
Admission: EM | Admit: 2020-04-20 | Discharge: 2020-04-22 | DRG: 812 | Disposition: A | Discharge status: Home / Self Care | Payer: Medicaid Other | Attending: Internal Medicine | Admitting: Internal Medicine

## 2020-04-20 ENCOUNTER — Encounter (HOSPITAL_COMMUNITY): Payer: Self-pay

## 2020-04-20 ENCOUNTER — Other Ambulatory Visit: Payer: Self-pay

## 2020-04-20 DIAGNOSIS — Z20822 Contact with and (suspected) exposure to covid-19: Secondary | ICD-10-CM | POA: Diagnosis present

## 2020-04-20 DIAGNOSIS — F17290 Nicotine dependence, other tobacco product, uncomplicated: Secondary | ICD-10-CM | POA: Diagnosis present

## 2020-04-20 DIAGNOSIS — D5700 Hb-SS disease with crisis, unspecified: Secondary | ICD-10-CM | POA: Diagnosis not present

## 2020-04-20 DIAGNOSIS — F1121 Opioid dependence, in remission: Secondary | ICD-10-CM | POA: Diagnosis present

## 2020-04-20 DIAGNOSIS — G894 Chronic pain syndrome: Secondary | ICD-10-CM | POA: Diagnosis present

## 2020-04-20 DIAGNOSIS — D638 Anemia in other chronic diseases classified elsewhere: Secondary | ICD-10-CM | POA: Diagnosis present

## 2020-04-20 LAB — CBC WITH DIFFERENTIAL/PLATELET
Abs Immature Granulocytes: 0.02 10*3/uL (ref 0.00–0.07)
Basophils Absolute: 0 10*3/uL (ref 0.0–0.1)
Basophils Relative: 0 %
Eosinophils Absolute: 0.4 10*3/uL (ref 0.0–0.5)
Eosinophils Relative: 5 %
HCT: 27.7 % — ABNORMAL LOW (ref 36.0–46.0)
Hemoglobin: 10.4 g/dL — ABNORMAL LOW (ref 12.0–15.0)
Immature Granulocytes: 0 %
Lymphocytes Relative: 43 %
Lymphs Abs: 4 10*3/uL (ref 0.7–4.0)
MCH: 31.9 pg (ref 26.0–34.0)
MCHC: 37.5 g/dL — ABNORMAL HIGH (ref 30.0–36.0)
MCV: 85 fL (ref 80.0–100.0)
Monocytes Absolute: 0.6 10*3/uL (ref 0.1–1.0)
Monocytes Relative: 7 %
Neutro Abs: 4.3 10*3/uL (ref 1.7–7.7)
Neutrophils Relative %: 45 %
Platelets: 361 10*3/uL (ref 150–400)
RBC: 3.26 MIL/uL — ABNORMAL LOW (ref 3.87–5.11)
RDW: 14.6 % (ref 11.5–15.5)
WBC: 9.4 10*3/uL (ref 4.0–10.5)
nRBC: 0.2 % (ref 0.0–0.2)

## 2020-04-20 LAB — RETICULOCYTES
Immature Retic Fract: 22.3 % — ABNORMAL HIGH (ref 2.3–15.9)
RBC.: 3.19 MIL/uL — ABNORMAL LOW (ref 3.87–5.11)
Retic Count, Absolute: 136.9 10*3/uL (ref 19.0–186.0)
Retic Ct Pct: 4.3 % — ABNORMAL HIGH (ref 0.4–3.1)

## 2020-04-20 LAB — COMPREHENSIVE METABOLIC PANEL
ALT: 12 U/L (ref 0–44)
AST: 17 U/L (ref 15–41)
Albumin: 4.2 g/dL (ref 3.5–5.0)
Alkaline Phosphatase: 35 U/L — ABNORMAL LOW (ref 38–126)
Anion gap: 8 (ref 5–15)
BUN: 11 mg/dL (ref 6–20)
CO2: 24 mmol/L (ref 22–32)
Calcium: 8.9 mg/dL (ref 8.9–10.3)
Chloride: 109 mmol/L (ref 98–111)
Creatinine, Ser: 0.89 mg/dL (ref 0.44–1.00)
GFR, Estimated: >60 mL/min (ref >60)
Glucose, Bld: 81 mg/dL (ref 70–99)
Potassium: 4.1 mmol/L (ref 3.5–5.1)
Sodium: 141 mmol/L (ref 135–145)
Total Bilirubin: 1.1 mg/dL (ref 0.3–1.2)
Total Protein: 7.1 g/dL (ref 6.5–8.1)

## 2020-04-20 LAB — I-STAT BETA HCG BLOOD, ED (MC, WL, AP ONLY): I-stat hCG, quantitative: <5 m[IU]/mL (ref <5)

## 2020-04-20 MED ORDER — HYDROMORPHONE HCL 2 MG/ML IJ SOLN
2.0000 mg | INTRAMUSCULAR | Status: AC
  Administered 2020-04-20: 2 mg via SUBCUTANEOUS
  Filled 2020-04-20: qty 1

## 2020-04-20 MED ORDER — OXYCODONE HCL 5 MG PO TABS
15.0000 mg | ORAL_TABLET | Freq: Once | ORAL | Status: AC
  Administered 2020-04-20: 15 mg via ORAL
  Filled 2020-04-20: qty 3

## 2020-04-20 MED ORDER — DIPHENHYDRAMINE HCL 25 MG PO CAPS
25.0000 mg | ORAL_CAPSULE | ORAL | Status: DC | PRN
  Administered 2020-04-20 – 2020-04-21 (×3): 25 mg via ORAL
  Filled 2020-04-20 (×3): qty 1

## 2020-04-20 NOTE — ED Triage Notes (Signed)
Patient c/o sickle cell pain in her right leg x 4 days.

## 2020-04-20 NOTE — ED Provider Notes (Signed)
[facility]-EMERGENCY DEPT Provider Note   CSN: [id number] Arrival date & time: 04/20/20  1813     History Chief Complaint  Patient presents with  . Sickle Cell Pain Crisis    Lindsey [name] is a 49 y.o. female.  HPI She presents for evaluation of right leg pain which she states is her typical sickle cell crisis pain. It started today. She is using her usual medications without relief. She denies fever, chills, cough, shortness of breath, weakness or dizziness. She has not had Covid vaccines. No other recent illnesses or hospitalizations. There are no other known modifying factors.    Past Medical History:  Diagnosis Date  . 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 Opioid dependence in remission (HCC)   . Sickle cell crisis [facility])     Patient Active Problem List   Diagnosis Date Noted  . Acute pain of left shoulder   . Right leg pain   . Sickle cell anemia with crisis (HCC) 09/10/2019  . Anemia of chronic disease 09/10/2019  . Current mild episode of major depressive disorder (HCC)   . Sickle cell crisis (HCC) 02/28/2019  . Opioid dependence in remission (HCC) 10/01/2017  . Sickle cell disease (HCC) 10/01/2017  . Chronic pain 10/01/2017  . 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 Leukocytosis 09/03/2016  . Sickle cell pain crisis (HCC) 07/20/2012  . 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 APPENDECTOMY    . CESAREAN SECTION    . OTHER SURGICAL HISTORY     c-section     OB History   No obstetric history on file.     Family History  Problem Relation Age of Onset  . Stroke Neg Hx        none that she knows of   . Seizures Neg Hx     Social History   Tobacco Use  . Smoking status: Current Every Day Smoker    Packs/day: 0.00    Types: Cigars  . Smokeless tobacco: Never Used  Vaping Use  . Vaping Use: Former  Substance Use Topics  . Alcohol use: Yes    Comment:  occasionally  . Drug use: No    Home Medications Prior to Admission medications   Medication Sig Start Date End Date Taking? Authorizing Provider  Aspirin-Acetaminophen-Caffeine (GOODY HEADACHE PO) Take 1 packet by mouth every 8 (eight) hours as needed (pain/headache).     [provider]  ergocalciferol (VITAMIN D2) 1.25 MG (50000 UT) capsule Take 1 capsule (50,000 Units total) by mouth once a week. Patient taking differently: Take 50,000 Units by mouth every Wednesday.  10/16/19   Massie Maroon, FNP  gabapentin (NEURONTIN) 300 MG capsule Take 1 capsule (300 mg total) by mouth 3 (three) times daily. 03/11/20   Massie Maroon, FNP  ibuprofen (ADVIL) 800 MG tablet TAKE 1 TABLET(800 MG) BY MOUTH EVERY 8 HOURS AS NEEDED Patient taking differently: Take 800 mg by mouth every 8 (eight) hours as needed for moderate pain.  10/14/19   Massie Maroon, FNP  Melatonin 10 MG TABS Take 10 mg by mouth at bedtime.    [provider]  oxyCODONE (OXYCONTIN) 10 mg 12 hr tablet Take 1 tablet (10 mg total) by mouth every 12 (twelve) hours. 04/15/20 05/15/20  Massie Maroon, FNP  Oxycodone HCl 10 MG TABS Take 1 tablet (10 mg total) by mouth every 6 (six) hours as needed.  04/08/20   Massie Maroon, FNP    Allergies    Patient has no known allergies.  Review of Systems   Review of Systems  All other systems reviewed and are negative.   Physical Exam Updated Vital Signs BP 122/90   Pulse 71   Temp 98.5 F (36.9 C) (Oral)   Resp 18   Ht 5' (1.524 m)   Wt 63.5 kg   LMP 04/07/2020 (Approximate)   SpO2 98%   BMI 27.34 kg/m   Physical Exam Vitals and nursing note reviewed.  Constitutional:      General: She is in acute distress (Crying persistently).     Appearance: She is well-developed and well-nourished. She is not ill-appearing, toxic-appearing or diaphoretic.  HENT:     Head: Normocephalic and atraumatic.     Right Ear: External ear normal.     Left Ear: External ear  normal.  Eyes:     Extraocular Movements: EOM normal.     Conjunctiva/sclera: Conjunctivae normal.     Pupils: Pupils are equal, round, and reactive to light.  Neck:     Trachea: Phonation normal.  Cardiovascular:     Rate and Rhythm: Normal rate and regular rhythm.     Heart sounds: Normal heart sounds.  Pulmonary:     Effort: Pulmonary effort is normal.     Breath sounds: Normal breath sounds.  Chest:     Chest wall: No bony tenderness.  Abdominal:     Palpations: Abdomen is soft.     Tenderness: There is no abdominal tenderness.  Musculoskeletal:        General: Tenderness (Right calf mild without swelling) present. No swelling. Normal range of motion.     Cervical back: Normal range of motion and neck supple.  Skin:    General: Skin is warm, dry and intact.     Coloration: Skin is not jaundiced or pale.  Neurological:     Mental Status: She is alert and oriented to person, place, and time.     Cranial Nerves: No cranial nerve deficit.     Sensory: No sensory deficit.     Motor: No abnormal muscle tone.     Coordination: Coordination normal.  Psychiatric:        Mood and Affect: Mood and affect normal.        Behavior: Behavior normal.        Thought Content: Thought content normal.        Judgment: Judgment normal.     Comments: Crying     ED Results / Procedures / Treatments   Labs (all labs ordered are listed, but only abnormal results are displayed) Labs Reviewed  COMPREHENSIVE METABOLIC PANEL - Abnormal; Notable for the following components:      Result Value   Alkaline Phosphatase 35 (*)    All other components within normal limits  CBC WITH DIFFERENTIAL/PLATELET - Abnormal; Notable for the following components:   RBC 3.26 (*)    Hemoglobin 10.4 (*)    HCT 27.7 (*)    MCHC 37.5 (*)    All other components within normal limits  RETICULOCYTES - Abnormal; Notable for the following components:   Retic Ct Pct 4.3 (*)    RBC. 3.19 (*)    Immature Retic Fract  22.3 (*)    All other components within normal limits  RESP PANEL BY RT-PCR (FLU A&B, COVID) [id number]  I-STAT BETA HCG BLOOD, ED ([facility], [facility], [facility] ONLY)    EKG None  Radiology No results found.  Procedures Procedures (including critical care time)  Medications Ordered in ED Medications  diphenhydrAMINE (BENADRYL) capsule 25-50 mg (25 mg Oral Given 04/20/20 2007)  HYDROmorphone (DILAUDID) injection 2 mg (2 mg Subcutaneous Given 04/20/20 2013)  HYDROmorphone (DILAUDID) injection 2 mg (2 mg Subcutaneous Given 04/20/20 2126)  oxyCODONE (Oxy IR/ROXICODONE) immediate release tablet 15 mg (15 mg Oral Given 04/20/20 2211)    ED Course  I have reviewed the triage vital signs and the nursing notes.  Pertinent labs & imaging results that were available during my care of the patient were reviewed by me and considered in my medical decision making (see chart for details).    MDM Rules/Calculators/A&P                           Patient Vitals for the past 24 hrs:  BP Temp Temp src Pulse Resp SpO2 Height Weight  04/20/20 2231 122/90 -- -- 71 18 98 % -- --  04/20/20 2200 98/65 -- -- 74 18 97 % -- --  04/20/20 2145 -- -- -- 73 -- 98 % -- --  04/20/20 2130 112/68 -- -- 72 18 97 % -- --  04/20/20 2115 -- -- -- 90 -- 99 % -- --  04/20/20 2100 (!) 110/91 -- -- 80 18 100 % -- --  04/20/20 2049 118/85 -- -- 64 18 100 % -- --  04/20/20 2045 -- -- -- 63 -- 100 % -- --  04/20/20 2030 118/85 -- -- 67 -- 100 % -- --  04/20/20 2016 (!) 116/52 -- -- 68 18 100 % -- --  04/20/20 2015 -- -- -- 84 -- 100 % -- --  04/20/20 2000 (!) 103/50 -- -- 60 18 100 % -- --  04/20/20 1945 -- -- -- 75 -- 100 % -- --  04/20/20 1930 -- -- -- 73 -- 94 % -- --  04/20/20 1915 -- -- -- 66 -- 99 % -- --  04/20/20 1912 (!) 133/94 -- -- 94 18 99 % -- --  04/20/20 1826 -- -- -- -- -- -- 5' (1.524 m) 63.5 kg  04/20/20 1824 113/83 98.5 F (36.9 C) Oral (!) 103 16 98 % -- --    10:34 PM Reevaluation with update and discussion.  After initial assessment and treatment, an updated evaluation reveals she has completed the series of medications ordered without improvement of her pain. She desires to be hospitalized. Findings discussed and questions answered. Mancel Bale   Medical Decision Making:  This patient is presenting for evaluation of sickle cell crisis, which does require a range of treatment options, and is a complaint that involves a high risk of morbidity and mortality. The differential diagnoses include sickle cell crisis, acute illness, metabolic disorder. I decided to review old records, and in summary middle-aged adult presenting with signs and symptoms of sickle cell crisis..  I did not require additional historical information from anyone.  Clinical Laboratory Tests Ordered, included CBC, Metabolic panel and Reticulocyte count Covid test. Review indicates hemoglobin low, reticulocyte count elevated.   Critical Interventions-clinical evaluation, laboratory testing, multiple rounds of analgesia given, observation and reassessment  After These Interventions, the Patient was reevaluated and was found with persistent pain with suspected source being sickle cell crisis. Reticulocyte count is elevated. Hemoglobin 10.6, and stable from recent baseline. Metabolic panel normal.  CRITICAL CARE-yes Performed by: Mancel Bale  Nursing Notes Reviewed/ Care Coordinated Applicable Imaging Reviewed Interpretation  of Laboratory Data incorporated into ED treatment   10:33 PM-Consult complete with hospitalist. Patient case explained and discussed.  He agrees to admit patient for further evaluation and treatment. Call ended at 11:38 PM  Final Clinical Impression(s) / ED Diagnoses Final diagnoses:  Sickle cell pain crisis [facility](HCC)    Rx / DC Orders ED Discharge Orders    None       Mancel BaleWentz, Jaleea Alesi, MD 04/21/20 1041

## 2020-04-21 ENCOUNTER — Encounter (HOSPITAL_COMMUNITY): Payer: Self-pay | Admitting: Internal Medicine

## 2020-04-21 DIAGNOSIS — D5700 Hb-SS disease with crisis, unspecified: Principal | ICD-10-CM

## 2020-04-21 LAB — CBC
HCT: 25.5 % — ABNORMAL LOW (ref 36.0–46.0)
Hemoglobin: 9.6 g/dL — ABNORMAL LOW (ref 12.0–15.0)
MCH: 32.2 pg (ref 26.0–34.0)
MCHC: 37.6 g/dL — ABNORMAL HIGH (ref 30.0–36.0)
MCV: 85.6 fL (ref 80.0–100.0)
Platelets: 323 10*3/uL (ref 150–400)
RBC: 2.98 MIL/uL — ABNORMAL LOW (ref 3.87–5.11)
RDW: 14.7 % (ref 11.5–15.5)
WBC: 10.3 10*3/uL (ref 4.0–10.5)
nRBC: 0.2 % (ref 0.0–0.2)

## 2020-04-21 LAB — RESP PANEL BY RT-PCR (FLU A&B, COVID) ARPGX2
Influenza A by PCR: NEGATIVE
Influenza B by PCR: NEGATIVE
SARS Coronavirus 2 by RT PCR: NEGATIVE

## 2020-04-21 LAB — CREATININE, SERUM
Creatinine, Ser: 0.84 mg/dL (ref 0.44–1.00)
GFR, Estimated: >60 mL/min (ref >60)

## 2020-04-21 LAB — HIV ANTIBODY (ROUTINE TESTING W REFLEX): HIV Screen 4th Generation wRfx: NONREACTIVE

## 2020-04-21 MED ORDER — KETOROLAC TROMETHAMINE 15 MG/ML IJ SOLN
15.0000 mg | Freq: Four times a day (QID) | INTRAMUSCULAR | Status: AC
  Administered 2020-04-21 – 2020-04-22 (×5): 15 mg via INTRAVENOUS
  Filled 2020-04-21 (×5): qty 1

## 2020-04-21 MED ORDER — GABAPENTIN 300 MG PO CAPS
300.0000 mg | ORAL_CAPSULE | Freq: Three times a day (TID) | ORAL | Status: DC
  Administered 2020-04-21 – 2020-04-22 (×4): 300 mg via ORAL
  Filled 2020-04-21 (×4): qty 1

## 2020-04-21 MED ORDER — HYDROMORPHONE HCL 1 MG/ML IJ SOLN
1.0000 mg | INTRAMUSCULAR | Status: DC | PRN
  Administered 2020-04-21 (×2): 1 mg via INTRAVENOUS
  Filled 2020-04-21 (×3): qty 1

## 2020-04-21 MED ORDER — POLYETHYLENE GLYCOL 3350 17 G PO PACK: 17.0000 g | PACK | Freq: Every day | ORAL | Status: DC | PRN

## 2020-04-21 MED ORDER — SODIUM CHLORIDE 0.9% FLUSH: 9.0000 mL | INTRAVENOUS | Status: DC | PRN

## 2020-04-21 MED ORDER — OXYCODONE HCL ER 10 MG PO T12A
10.0000 mg | EXTENDED_RELEASE_TABLET | Freq: Two times a day (BID) | ORAL | Status: DC
  Administered 2020-04-21 – 2020-04-22 (×4): 10 mg via ORAL
  Filled 2020-04-21 (×4): qty 1

## 2020-04-21 MED ORDER — HYDROMORPHONE 1 MG/ML IV SOLN
INTRAVENOUS | Status: DC
Start: 2020-04-21 — End: 2020-04-22
  Administered 2020-04-21: 1.26 mg via INTRAVENOUS
  Administered 2020-04-21: 5.5 mg via INTRAVENOUS
  Administered 2020-04-22: 3 mg via INTRAVENOUS
  Administered 2020-04-22 (×2): 3.5 mg via INTRAVENOUS

## 2020-04-21 MED ORDER — OXYCODONE HCL 5 MG PO TABS
10.0000 mg | ORAL_TABLET | Freq: Four times a day (QID) | ORAL | Status: DC | PRN
  Administered 2020-04-21: 10 mg via ORAL
  Filled 2020-04-21: qty 2

## 2020-04-21 MED ORDER — MELATONIN 5 MG PO TABS
10.0000 mg | ORAL_TABLET | Freq: Every day | ORAL | Status: DC
Start: 2020-04-21 — End: 2020-04-22
  Administered 2020-04-21: 10 mg via ORAL
  Filled 2020-04-21: qty 2

## 2020-04-21 MED ORDER — DEXTROSE-NACL 5-0.45 % IV SOLN: INTRAVENOUS | Status: DC

## 2020-04-21 MED ORDER — ONDANSETRON HCL 4 MG/2ML IJ SOLN: 4.0000 mg | Freq: Four times a day (QID) | INTRAMUSCULAR | Status: DC | PRN

## 2020-04-21 MED ORDER — NALOXONE HCL 0.4 MG/ML IJ SOLN: 0.4000 mg | INTRAMUSCULAR | Status: DC | PRN

## 2020-04-21 MED ORDER — HYDROMORPHONE 1 MG/ML IV SOLN
INTRAVENOUS | Status: DC
Start: 2020-04-21 — End: 2020-04-21
  Administered 2020-04-21: 30 mg via INTRAVENOUS
  Administered 2020-04-21: 8.19 mg via INTRAVENOUS
  Filled 2020-04-21: qty 30

## 2020-04-21 MED ORDER — SENNOSIDES-DOCUSATE SODIUM 8.6-50 MG PO TABS
1.0000 | ORAL_TABLET | Freq: Two times a day (BID) | ORAL | Status: DC
  Administered 2020-04-21 – 2020-04-22 (×4): 1 via ORAL
  Filled 2020-04-21 (×4): qty 1

## 2020-04-21 MED ORDER — ENOXAPARIN SODIUM 40 MG/0.4ML ~~LOC~~ SOLN
40.0000 mg | SUBCUTANEOUS | Status: DC
  Administered 2020-04-21 – 2020-04-22 (×2): 40 mg via SUBCUTANEOUS
  Filled 2020-04-21 (×2): qty 0.4

## 2020-04-21 NOTE — H&P (Signed)
History and Physical    Lindsey [name] VVK:[id number] DOB: August 19, 1970 DOA: 04/20/2020  PCP: Massie Maroon, FNP  Patient coming from: Home.  Chief Complaint: Right lower extremity pain.  HPI: Lindsey [name] is a 49 y.o. female with history of sickle cell anemia presents to the ER with complaint of worsening pain in the right lower extremity typical of her sickle cell pain crisis.  Denies any trauma fall or swelling.  Denies any headache visual symptoms or shortness of breath or chest pain.  ED Course: In the ER patient was afebrile and not hypoxic.  On exam patient does not have any swelling of the right lower extremity.  Hemoglobin is at baseline.  Despite giving multiple dose of pain relief medication patient is still in pain admitted for sickle cell pain crisis.  Covid test was negative.  Review of Systems: As per HPI, rest all negative.   Past Medical History:  Diagnosis Date  . 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 Opioid dependence in remission (HCC)   . Sickle cell crisis [facility])     Past Surgical History:  Procedure Laterality Date  . APPENDECTOMY    . CESAREAN SECTION    . OTHER SURGICAL HISTORY     c-section     reports that she has been smoking cigars. She has been smoking about 0.00 packs per day. She has never used smokeless tobacco. She reports current alcohol use. She reports that she does not use drugs.  No Known Allergies  Family History  Problem Relation Age of Onset  . Stroke Neg Hx        none that she knows of   . Seizures Neg Hx     Prior to Admission medications   Medication Sig Start Date End Date Taking? Authorizing Provider  Aspirin-Acetaminophen-Caffeine (GOODY HEADACHE PO) Take 1 packet by mouth every 8 (eight) hours as needed (pain/headache).    Yes [provider]  ergocalciferol (VITAMIN D2) 1.25 MG (50000 UT) capsule Take 1 capsule (50,000 Units total) by mouth once a week. Patient taking differently: Take 50,000 Units by  mouth every Wednesday. 10/16/19  Yes Massie Maroon, FNP  gabapentin (NEURONTIN) 300 MG capsule Take 1 capsule (300 mg total) by mouth 3 (three) times daily. 03/11/20  Yes Massie Maroon, FNP  ibuprofen (ADVIL) 800 MG tablet TAKE 1 TABLET(800 MG) BY MOUTH EVERY 8 HOURS AS NEEDED Patient taking differently: Take 800 mg by mouth every 8 (eight) hours as needed for moderate pain. 10/14/19  Yes Massie Maroon, FNP  Melatonin 10 MG TABS Take 10 mg by mouth at bedtime.   Yes [provider]  oxyCODONE (OXYCONTIN) 10 mg 12 hr tablet Take 1 tablet (10 mg total) by mouth every 12 (twelve) hours. 04/15/20 05/15/20 Yes Massie Maroon, FNP  Oxycodone HCl 10 MG TABS Take 1 tablet (10 mg total) by mouth every 6 (six) hours as needed. 04/08/20  Yes Massie Maroon, FNP    Physical Exam: Constitutional: Moderately built and nourished. Vitals:   04/20/20 2330 04/21/20 0113 04/21/20 0130 04/21/20 0319  BP: 103/61 114/71 98/62 126/67  Pulse: 71 80 65 72  Resp: 14 15 12 14   Temp:      TempSrc:      SpO2: 97% 98% 98% 100%  Weight:      Height:       Eyes: Anicteric no pallor. ENMT: No discharge from the ears eyes nose or mouth. Neck: No mass felt.  No  neck rigidity. Respiratory: No rhonchi or crepitations. Cardiovascular: S1-S2 heard. Abdomen: Soft nontender bowel sounds present. Musculoskeletal: No edema or erythema.  Pulses felt. Skin: No rash. Neurologic: Alert awake oriented to time place and person.  Moves all extremities. Psychiatric: Appears normal.  Normal affect.   Labs on Admission: I have personally reviewed following labs and imaging studies  CBC: Recent Labs  Lab 04/20/20 1930  WBC 9.4  NEUTROABS 4.3  HGB 10.4*  HCT 27.7*  MCV 85.0  PLT 361   Basic Metabolic Panel: Recent Labs  Lab 04/20/20 1930  NA 141  K 4.1  CL 109  CO2 24  GLUCOSE 81  BUN 11  CREATININE 0.89  CALCIUM 8.9   GFR: Estimated Creatinine Clearance: 64.3 mL/min (by C-G formula based on  SCr of 0.89 mg/dL). Liver Function Tests: Recent Labs  Lab 04/20/20 1930  AST 17  ALT 12  ALKPHOS 35*  BILITOT 1.1  PROT 7.1  ALBUMIN 4.2   No results for input(s): LIPASE, AMYLASE in the last 168 hours. No results for input(s): AMMONIA in the last 168 hours. Coagulation Profile: No results for input(s): INR, PROTIME in the last 168 hours. Cardiac Enzymes: No results for input(s): CKTOTAL, CKMB, CKMBINDEX, TROPONINI in the last 168 hours. BNP (last 3 results) No results for input(s): PROBNP in the last 8760 hours. HbA1C: No results for input(s): HGBA1C in the last 72 hours. CBG: No results for input(s): GLUCAP in the last 168 hours. Lipid Profile: No results for input(s): CHOL, HDL, LDLCALC, TRIG, CHOLHDL, LDLDIRECT in the last 72 hours. Thyroid Function Tests: No results for input(s): TSH, T4TOTAL, FREET4, T3FREE, THYROIDAB in the last 72 hours. Anemia Panel: Recent Labs    04/20/20 1930  RETICCTPCT 4.3*   Urine analysis:    Component Value Date/Time   COLORURINE YELLOW 10/24/2019 0230   APPEARANCEUR CLEAR 10/24/2019 0230   LABSPEC 1.016 10/24/2019 0230   PHURINE 5.0 10/24/2019 0230   GLUCOSEU NEGATIVE 10/24/2019 0230   HGBUR NEGATIVE 10/24/2019 0230   BILIRUBINUR neg 04/07/2020 1014   KETONESUR NEGATIVE 10/24/2019 0230   PROTEINUR Negative 04/07/2020 1014   PROTEINUR NEGATIVE 10/24/2019 0230   UROBILINOGEN 0.2 04/07/2020 1014   UROBILINOGEN 1.0 12/25/2012 1755   NITRITE neg 04/07/2020 1014   NITRITE NEGATIVE 10/24/2019 0230   LEUKOCYTESUR Small (1+) (A) 04/07/2020 1014   LEUKOCYTESUR TRACE (A) 10/24/2019 0230   Sepsis Labs: @LABRCNTIP (procalcitonin:4,lacticidven:4) ) Recent Results (from the past 240 hour(s))  Resp Panel by RT-PCR (Flu A&B, Covid) Nasopharyngeal Swab     Status: None   Collection Time: 04/20/20 10:35 PM   Specimen: Nasopharyngeal Swab; Nasopharyngeal(NP) swabs in vial transport medium  Result Value Ref Range Status   SARS Coronavirus 2  by RT PCR NEGATIVE NEGATIVE Final    Comment: (NOTE) SARS-CoV-2 target nucleic acids are NOT DETECTED.  The SARS-CoV-2 RNA is generally detectable in upper respiratory specimens during the acute phase of infection. The lowest concentration of SARS-CoV-2 viral copies this assay can detect is 138 copies/mL. A negative result does not preclude SARS-Cov-2 infection and should not be used as the sole basis for treatment or other patient management decisions. A negative result may occur with  improper specimen collection/handling, submission of specimen other than nasopharyngeal swab, presence of viral mutation(s) within the areas targeted by this assay, and inadequate number of viral copies(<138 copies/mL). A negative result must be combined with clinical observations, patient history, and epidemiological information. The expected result is Negative.  Fact Sheet for Patients:  BloggerCourse.com  Fact Sheet for Healthcare Providers:  SeriousBroker.it  This test is no t yet approved or cleared by the [place] FDA and  has been authorized for detection and/or diagnosis of SARS-CoV-2 by FDA under an Emergency Use Authorization (EUA). This EUA will remain  in effect (meaning this test can be used) for the duration of the COVID-19 declaration under Section 564(b)(1) of the Act, 21 U.S.C.section 360bbb-3(b)(1), unless the authorization is terminated  or revoked sooner.       Influenza A by PCR NEGATIVE NEGATIVE Final   Influenza B by PCR NEGATIVE NEGATIVE Final    Comment: (NOTE) The Xpert Xpress SARS-CoV-2/FLU/RSV plus assay is intended as an aid in the diagnosis of influenza from Nasopharyngeal swab specimens and should not be used as a sole basis for treatment. Nasal washings and aspirates are unacceptable for Xpert Xpress SARS-CoV-2/FLU/RSV testing.  Fact Sheet for Patients: BloggerCourse.com  Fact  Sheet for Healthcare Providers: SeriousBroker.it  This test is not yet approved or cleared by the [place] FDA and has been authorized for detection and/or diagnosis of SARS-CoV-2 by FDA under an Emergency Use Authorization (EUA). This EUA will remain in effect (meaning this test can be used) for the duration of the COVID-19 declaration under Section 564(b)(1) of the Act, 21 U.S.C. section 360bbb-3(b)(1), unless the authorization is terminated or revoked.  Performed at [facility], [address], [place], [place] [address]      Radiological Exams on Admission: No results found.    Assessment/Plan Principal Problem:   Sickle cell pain crisis (HCC)    1. Sickle cell pain crisis for which I have started patient on weight-based Dilaudid PCA and continuing her home dose of OxyContin.  Toradol.  Follow CBC.  IV fluids. 2. Sickle cell anemia follow CBC.  Since patient is requiring Dilaudid PCA for pain control will admit as inpatient.   DVT prophylaxis: Lovenox. Code Status: Full code. Family Communication: Discussed with patient. Disposition Plan: Home. Consults called: None. Admission status: Inpatient.   Eduard Clos MD [facility] Pager [phone].  If 7PM-7AM, please contact night-coverage [email or web address] Password [facility]  04/21/2020, 3:27 AM

## 2020-04-21 NOTE — Progress Notes (Signed)
Lindsey [name] is a 49 year old female with a medical history significant for sickle cell disease, chronic pain syndrome, opiate dependence and tolerance, and history of anemia of chronic disease was admitted overnight for sickle cell pain crisis. Patient says that she was in her usual state of health prior to 2 days ago when pain intensity increased primarily to low back and lower extremities.  Current pain intensity is 6/10.  Patient denies any headache, chest pain, urinary symptoms, nausea, vomiting, or diarrhea.  Care plan: Decrease settings of IV Dilaudid PCA to 0.5 mg, 10-minute lockout, and 3 mg/h. Reduce IV fluids to [facility] Add oxycodone 10 mg every 6 hours as needed for severe breakthrough pain. Probable discharge on 04/22/2020.    [name]  APRN, MSN, FNP-C Patient Care [facility] [address] [place], [place] [address] [phone]

## 2020-04-22 MED ORDER — OXYCODONE HCL 10 MG PO TABS: 10.0000 mg | ORAL_TABLET | Freq: Four times a day (QID) | ORAL | 0 refills | Status: DC | PRN

## 2020-04-22 MED ORDER — HYDROMORPHONE 1 MG/ML IV SOLN
INTRAVENOUS | Status: DC
Start: 2020-04-22 — End: 2020-04-22
  Administered 2020-04-22: 0.1 mg via INTRAVENOUS

## 2020-04-22 MED ORDER — KETOROLAC TROMETHAMINE 15 MG/ML IJ SOLN
15.0000 mg | Freq: Four times a day (QID) | INTRAMUSCULAR | Status: DC
  Filled 2020-04-22: qty 1

## 2020-04-22 NOTE — Progress Notes (Signed)
AVS reviewed and questions answered.  Patient alert and oriented, skin warm and dry.  Transported to main lobby and care transferred to family/friend without incident.  

## 2020-04-22 NOTE — Discharge Instructions (Signed)
Sickle Cell Anemia, Adult  Sickle cell anemia is a condition where your red blood cells are shaped like sickles. Red blood cells carry oxygen through the body. Sickle-shaped cells do not live as long as normal red blood cells. They also clump together and block blood from flowing through the blood vessels. This prevents the body from getting enough oxygen. Sickle cell anemia causes organ damage and pain. It also increases the risk of infection. Follow these instructions at home: Medicines  Take over-the-counter and prescription medicines only as told by your doctor.  If you were prescribed an antibiotic medicine, take it as told by your doctor. Do not stop taking the antibiotic even if you start to feel better.  If you develop a fever, do not take medicines to lower the fever right away. Tell your doctor about the fever. Managing pain, stiffness, and swelling  Try these methods to help with pain: ? Use a heating pad. ? Take a warm bath. ? Distract yourself, such as by watching TV. Eating and drinking  Drink enough fluid to keep your pee (urine) clear or pale yellow. Drink more in hot weather and during exercise.  Limit or avoid alcohol.  Eat a healthy diet. Eat plenty of fruits, vegetables, whole grains, and lean protein.  Take vitamins and supplements as told by your doctor. Traveling  When traveling, keep these with you: ? Your medical information. ? The names of your doctors. ? Your medicines.  If you need to take an airplane, talk to your doctor first. Activity  Rest often.  Avoid exercises that make your heart beat much faster, such as jogging. General instructions  Do not use products that have nicotine or tobacco, such as cigarettes and e-cigarettes. If you need help quitting, ask your doctor.  Consider wearing a medical alert bracelet.  Avoid being in high places (high altitudes), such as mountains.  Avoid very hot or cold temperatures.  Avoid places where the  temperature changes a lot.  Keep all follow-up visits as told by your doctor. This is important. Contact a doctor if:  A joint hurts.  Your feet or hands hurt or swell.  You feel tired (fatigued). Get help right away if:  You have symptoms of infection. These include: ? Fever. ? Chills. ? Being very tired. ? Irritability. ? Poor eating. ? Throwing up (vomiting).  You feel dizzy or faint.  You have new stomach pain, especially on the left side.  You have a an erection (priapism) that lasts more than 4 hours.  You have numbness in your arms or legs.  You have a hard time moving your arms or legs.  You have trouble talking.  You have pain that does not go away when you take medicine.  You are short of breath.  You are breathing fast.  You have a long-term cough.  You have pain in your chest.  You have a bad headache.  You have a stiff neck.  Your stomach looks bloated even though you did not eat much.  Your skin is pale.  You suddenly cannot see well. Summary  Sickle cell anemia is a condition where your red blood cells are shaped like sickles.  Follow your doctor's advice on ways to manage pain, food to eat, activities to do, and steps to take for safe travel.  Get medical help right away if you have any signs of infection, such as a fever. This information is not intended to replace advice given to you by   your health care provider. Make sure you discuss any questions you have with your health care provider. Document Revised: 08/17/2018 Document Reviewed: 05/31/2016 Elsevier Patient Education  2020 Elsevier Inc.  

## 2020-04-22 NOTE — Discharge Summary (Signed)
Physician Discharge Summary  Lindsey [name] VOZ:[id number] DOB: 06-30-1970 DOA: 04/20/2020  PCP: Massie Maroon, FNP  Admit date: 04/20/2020  Discharge date: 04/22/2020  Discharge Diagnoses:  Principal Problem:   Sickle cell pain crisis [facility])   Discharge Condition: Stable  Disposition:   Follow-up Information    Massie Maroon, FNP Follow up in 2 week(s).   Specialty: Family Medicine Contact information: [address] [place] [address] [phone]              Pt is discharged home in good condition and is to follow up with Massie Maroon, FNP this week to have labs evaluated. Lindsey [name] is instructed to increase activity slowly and balance with rest for the next few days, and use prescribed medication to complete treatment of pain  Diet: Regular Wt Readings from Last 3 Encounters:  04/20/20 63.5 kg  04/07/20 63.9 kg  02/19/20 65.8 kg    History of present illness:  Lindsey [name] is a 49 year old female with a medical history significant for sickle cell disease, chronic pain syndrome, opiate dependence and tolerance, and history of anemia of chronic disease presented to ER with complaints of worsening pain in the right lower extremity that is consistent with her typical sickle cell pain crisis.  Patient denies any trauma, falls, or swelling.  Denies any headache, visual symptoms, shortness of breath, or chest pain.  ED course: In the ER, patient was afebrile and nonhypoxic.  On exam, patient does not have any swelling of the right lower extremity.  Hemoglobin is consistent with baseline.  Despite giving multiple doses of pain relief medication, patient is still complaining of pain and was admitted for sickle cell pain crisis.  COVID-19 test negative.  Hospital Course:  Patient was admitted for sickle cell pain crisis and managed appropriately with IVF, IV Dilaudid via PCA and IV Toradol, as well as other adjunct therapies per sickle cell pain  management protocols.  IV Dilaudid PCA weaned appropriately.  Patient will resume home medications.  Patient is requesting oxycodone which is due on 04/23/2020.  Oxycodone 10 mg #60 was sent to patient's pharmacy.  Reviewed PDMP prior to prescribing opiate medications, no inconsistencies noted. Patient is alert, oriented, and ambulating without assistance.  She will follow up with this provider in 2 weeks.  Will repeat CBC and CMP at that time.  Labs remain largely consistent with patient's baseline throughout admission.  Patient was therefore discharged home today in a hemodynamically stable condition.   Lindsey [name] will follow-up with PCP within 1 week of this discharge. Lindsey [name] was counseled extensively about nonpharmacologic means of pain management, patient verbalized understanding and was appreciative of  the care received during this admission.   We discussed the need for good hydration, monitoring of hydration status, avoidance of heat, cold, stress, and infection triggers.  Continue folic acid 1 mg daily.. Patient was reminded of the need to seek medical attention immediately if any symptom of bleeding, anemia, or infection occurs.  Discharge Exam: Vitals:   04/22/20 1140 04/22/20 1232  BP:  106/67  Pulse:  63  Resp: 13 12  Temp:  98.6 F (37 C)  SpO2: 93% 98%   Vitals:   04/22/20 0508 04/22/20 0932 04/22/20 1140 04/22/20 1232  BP:  96/63  106/67  Pulse:  61  63  Resp: 16 15 13 12   Temp:  98.8 F (37.1 C)  98.6 F (37 C)  TempSrc:  Oral  Oral  SpO2: 96% 100%  93% 98%  Weight:      Height:        General appearance : Awake, alert, not in any distress. Speech Clear. Not toxic looking HEENT: Atraumatic and Normocephalic, pupils equally reactive to light and accomodation Neck: Supple, no JVD. No cervical lymphadenopathy.  Chest: Good air entry bilaterally, no added sounds  CVS: S1 S2 regular, no murmurs.  Abdomen: Bowel sounds present, Non tender and not distended with no  gaurding, rigidity or rebound. Extremities: B/L Lower Ext shows no edema, both legs are warm to touch Neurology: Awake alert, and oriented X 3, CN II-XII intact, Non focal Skin: No Rash  Discharge Instructions  Discharge Instructions    Discharge patient   Complete by: As directed    Discharge disposition: 01-Home or Self Care   Discharge patient date: 04/22/2020     Allergies as of 04/22/2020   No Known Allergies     Medication List    TAKE these medications   ergocalciferol 1.25 MG (50000 UT) capsule Commonly known as: VITAMIN D2 Take 1 capsule (50,000 Units total) by mouth once a week. What changed: when to take this   gabapentin 300 MG capsule Commonly known as: NEURONTIN Take 1 capsule (300 mg total) by mouth 3 (three) times daily.   GOODY HEADACHE PO Take 1 packet by mouth every 8 (eight) hours as needed (pain/headache).   ibuprofen 800 MG tablet Commonly known as: ADVIL TAKE 1 TABLET(800 MG) BY MOUTH EVERY 8 HOURS AS NEEDED What changed: See the new instructions.   Melatonin 10 MG Tabs Take 10 mg by mouth at bedtime.   oxyCODONE 10 mg 12 hr tablet Commonly known as: OXYCONTIN Take 1 tablet (10 mg total) by mouth every 12 (twelve) hours. What changed: Another medication with the same name was changed. Make sure you understand how and when to take each.   Oxycodone HCl 10 MG Tabs Take 1 tablet (10 mg total) by mouth every 6 (six) hours as needed. Start taking on: April 23, 2020 What changed: These instructions start on April 23, 2020. If you are unsure what to do until then, ask your doctor or other care provider.       The results of significant diagnostics from this hospitalization (including imaging, microbiology, ancillary and laboratory) are listed below for reference.    Significant Diagnostic Studies: No results found.  Microbiology: Recent Results (from the past 240 hour(s))  Resp Panel by RT-PCR (Flu A&B, Covid) Nasopharyngeal Swab      Status: None   Collection Time: 04/20/20 10:35 PM   Specimen: Nasopharyngeal Swab; Nasopharyngeal(NP) swabs in vial transport medium  Result Value Ref Range Status   SARS Coronavirus 2 by RT PCR NEGATIVE NEGATIVE Final    Comment: (NOTE) SARS-CoV-2 target nucleic acids are NOT DETECTED.  The SARS-CoV-2 RNA is generally detectable in upper respiratory specimens during the acute phase of infection. The lowest concentration of SARS-CoV-2 viral copies this assay can detect is 138 copies/mL. A negative result does not preclude SARS-Cov-2 infection and should not be used as the sole basis for treatment or other patient management decisions. A negative result may occur with  improper specimen collection/handling, submission of specimen other than nasopharyngeal swab, presence of viral mutation(s) within the areas targeted by this assay, and inadequate number of viral copies(<138 copies/mL). A negative result must be combined with clinical observations, patient history, and epidemiological information. The expected result is Negative.  Fact Sheet for Patients:  BloggerCourse.com  Fact Sheet for Healthcare  Providers:  SeriousBroker.it  This test is no t yet approved or cleared by the [place] and  has been authorized for detection and/or diagnosis of SARS-CoV-2 by FDA under an Emergency Use Authorization (EUA). This EUA will remain  in effect (meaning this test can be used) for the duration of the COVID-19 declaration under Section 564(b)(1) of the Act, 21 U.S.C.section 360bbb-3(b)(1), unless the authorization is terminated  or revoked sooner.       Influenza A by PCR NEGATIVE NEGATIVE Final   Influenza B by PCR NEGATIVE NEGATIVE Final    Comment: (NOTE) The Xpert Xpress SARS-CoV-2/FLU/RSV plus assay is intended as an aid in the diagnosis of influenza from Nasopharyngeal swab specimens and should not be used as a sole basis  for treatment. Nasal washings and aspirates are unacceptable for Xpert Xpress SARS-CoV-2/FLU/RSV testing.  Fact Sheet for Patients: BloggerCourse.com  Fact Sheet for Healthcare Providers: SeriousBroker.it  This test is not yet approved or cleared by the [place] FDA and has been authorized for detection and/or diagnosis of SARS-CoV-2 by FDA under an Emergency Use Authorization (EUA). This EUA will remain in effect (meaning this test can be used) for the duration of the COVID-19 declaration under Section 564(b)(1) of the Act, 21 U.S.C. section 360bbb-3(b)(1), unless the authorization is terminated or revoked.  Performed at [facility], [address], [place], [place] [address]      Labs: Basic Metabolic Panel: Recent Labs  Lab 04/20/20 1930 04/21/20 0326  NA 141  --   K 4.1  --   CL 109  --   CO2 24  --   GLUCOSE 81  --   BUN 11  --   CREATININE 0.89 0.84  CALCIUM 8.9  --    Liver Function Tests: Recent Labs  Lab 04/20/20 1930  AST 17  ALT 12  ALKPHOS 35*  BILITOT 1.1  PROT 7.1  ALBUMIN 4.2   No results for input(s): LIPASE, AMYLASE in the last 168 hours. No results for input(s): AMMONIA in the last 168 hours. CBC: Recent Labs  Lab 04/20/20 1930 04/21/20 0326  WBC 9.4 10.3  NEUTROABS 4.3  --   HGB 10.4* 9.6*  HCT 27.7* 25.5*  MCV 85.0 85.6  PLT 361 323   Cardiac Enzymes: No results for input(s): CKTOTAL, CKMB, CKMBINDEX, TROPONINI in the last 168 hours. BNP: Invalid input(s): POCBNP CBG: No results for input(s): GLUCAP in the last 168 hours.  Time coordinating discharge: 35 minutes  Signed: [name]  APRN, MSN, FNP-C Patient Care [facility] [address] [place], [place] [address] [phone]  [facility] 04/22/2020, 12:47 PM

## 2020-04-23 ENCOUNTER — Institutional Professional Consult (permissible substitution): Payer: Medicaid Other | Admitting: Clinical

## 2020-04-27 ENCOUNTER — Institutional Professional Consult (permissible substitution): Payer: Medicaid Other | Admitting: Clinical

## 2020-05-02 ENCOUNTER — Telehealth: Payer: Self-pay | Admitting: Family Medicine

## 2020-05-02 NOTE — Telephone Encounter (Signed)
  [facility]    Ms. Harrington is a 49 year old female with a medical history significant for sickle cell disease, chronic pain syndrome, and opiate dependence and tolerance that notified the on-call service with complaints of bilateral lower extremity and low back pain that has been increasing over the past several days.  Patient attributes pain crisis to being without home pain medications.  Currently, pain intensity is 4/10.  Patient advised to hydrate with 64 ounces of fluid, ibuprofen 800 mg every 8 hours as needed for mild to moderate pain, and Tylenol 1000 mg every 6 hours as needed. Patient last filled oxycodone 10 mg #60 on 04/23/2020.  She says that she is out of medications.  Patient unable to get refill prior to 05/07/2020.  Also, patient is out of OxyContin and last filled 30-day prescription on 04/15/2020.  Next prescription cannot be filled prior to 05/16/2020.  Patient advised against taking more medication than prescribed.  She does not have home opiate medications and recommended that patient follows up in the ER if pain worsens or any new problems develop.   [name]  APRN, MSN, FNP-C Patient Care [facility] [address] [place], [place] [address] [phone]

## 2020-05-03 ENCOUNTER — Encounter (HOSPITAL_COMMUNITY): Payer: Self-pay | Admitting: Emergency Medicine

## 2020-05-03 ENCOUNTER — Telehealth: Payer: Self-pay | Admitting: Family Medicine

## 2020-05-03 ENCOUNTER — Other Ambulatory Visit: Payer: Self-pay

## 2020-05-03 ENCOUNTER — Other Ambulatory Visit: Payer: Self-pay | Admitting: Family Medicine

## 2020-05-03 ENCOUNTER — Observation Stay (HOSPITAL_COMMUNITY)
Admission: EM | Admit: 2020-05-03 | Discharge: 2020-05-04 | Disposition: A | Discharge status: Home / Self Care | Payer: Medicaid Other | Attending: Internal Medicine | Admitting: Internal Medicine

## 2020-05-03 DIAGNOSIS — D57219 Sickle-cell/Hb-C disease with crisis, unspecified: Principal | ICD-10-CM | POA: Insufficient documentation

## 2020-05-03 DIAGNOSIS — Z20822 Contact with and (suspected) exposure to covid-19: Secondary | ICD-10-CM | POA: Insufficient documentation

## 2020-05-03 DIAGNOSIS — D5700 Hb-SS disease with crisis, unspecified: Secondary | ICD-10-CM | POA: Diagnosis present

## 2020-05-03 DIAGNOSIS — G894 Chronic pain syndrome: Secondary | ICD-10-CM

## 2020-05-03 DIAGNOSIS — D571 Sickle-cell disease without crisis: Secondary | ICD-10-CM | POA: Diagnosis present

## 2020-05-03 DIAGNOSIS — F17290 Nicotine dependence, other tobacco product, uncomplicated: Secondary | ICD-10-CM | POA: Diagnosis not present

## 2020-05-03 DIAGNOSIS — D638 Anemia in other chronic diseases classified elsewhere: Secondary | ICD-10-CM | POA: Diagnosis present

## 2020-05-03 DIAGNOSIS — Z7982 Long term (current) use of aspirin: Secondary | ICD-10-CM | POA: Insufficient documentation

## 2020-05-03 LAB — CBC WITH DIFFERENTIAL/PLATELET
Abs Immature Granulocytes: 0.04 10*3/uL (ref 0.00–0.07)
Band Neutrophils: 0 %
Basophils Absolute: 0 10*3/uL (ref 0.0–0.1)
Basophils Relative: 0 %
Eosinophils Absolute: 0.3 10*3/uL (ref 0.0–0.5)
Eosinophils Relative: 4 %
HCT: 27.7 % — ABNORMAL LOW (ref 36.0–46.0)
Hemoglobin: 10.3 g/dL — ABNORMAL LOW (ref 12.0–15.0)
Immature Granulocytes: 0 %
Lymphocytes Relative: 30 %
Lymphs Abs: 3 10*3/uL (ref 0.7–4.0)
MCH: 31.7 pg (ref 26.0–34.0)
MCHC: 37.2 g/dL — ABNORMAL HIGH (ref 30.0–36.0)
MCV: 85.2 fL (ref 80.0–100.0)
Monocytes Absolute: 0.6 10*3/uL (ref 0.1–1.0)
Monocytes Relative: 6 %
Neutro Abs: 6 10*3/uL (ref 1.7–7.7)
Neutrophils Relative %: 60 %
Platelets: 409 10*3/uL — ABNORMAL HIGH (ref 150–400)
RBC: 3.25 MIL/uL — ABNORMAL LOW (ref 3.87–5.11)
RDW: 14.3 % (ref 11.5–15.5)
WBC: 9.7 10*3/uL (ref 4.0–10.5)
nRBC: 0.4 % — ABNORMAL HIGH (ref 0.0–0.2)

## 2020-05-03 LAB — I-STAT BETA HCG BLOOD, ED (MC, WL, AP ONLY): I-stat hCG, quantitative: <5 m[IU]/mL (ref <5)

## 2020-05-03 LAB — COMPREHENSIVE METABOLIC PANEL
ALT: 12 U/L (ref 0–44)
AST: 17 U/L (ref 15–41)
Albumin: 4.4 g/dL (ref 3.5–5.0)
Alkaline Phosphatase: 36 U/L — ABNORMAL LOW (ref 38–126)
Anion gap: 9 (ref 5–15)
BUN: 7 mg/dL (ref 6–20)
CO2: 24 mmol/L (ref 22–32)
Calcium: 9.3 mg/dL (ref 8.9–10.3)
Chloride: 107 mmol/L (ref 98–111)
Creatinine, Ser: 0.73 mg/dL (ref 0.44–1.00)
GFR, Estimated: >60 mL/min (ref >60)
Glucose, Bld: 81 mg/dL (ref 70–99)
Potassium: 3.9 mmol/L (ref 3.5–5.1)
Sodium: 140 mmol/L (ref 135–145)
Total Bilirubin: 1.3 mg/dL — ABNORMAL HIGH (ref 0.3–1.2)
Total Protein: 7.3 g/dL (ref 6.5–8.1)

## 2020-05-03 LAB — RETICULOCYTES
Immature Retic Fract: 25.4 % — ABNORMAL HIGH (ref 2.3–15.9)
RBC.: 3.25 MIL/uL — ABNORMAL LOW (ref 3.87–5.11)
Retic Count, Absolute: 124.2 10*3/uL (ref 19.0–186.0)
Retic Ct Pct: 3.8 % — ABNORMAL HIGH (ref 0.4–3.1)

## 2020-05-03 LAB — RESP PANEL BY RT-PCR (FLU A&B, COVID) ARPGX2
Influenza A by PCR: NEGATIVE
Influenza B by PCR: NEGATIVE
SARS Coronavirus 2 by RT PCR: NEGATIVE

## 2020-05-03 MED ORDER — HYDROMORPHONE HCL 2 MG/ML IJ SOLN
2.0000 mg | INTRAMUSCULAR | Status: AC
  Administered 2020-05-03: 2 mg via INTRAVENOUS
  Filled 2020-05-03: qty 1

## 2020-05-03 MED ORDER — OXYCODONE HCL ER 10 MG PO T12A
10.0000 mg | EXTENDED_RELEASE_TABLET | Freq: Two times a day (BID) | ORAL | Status: DC
  Administered 2020-05-04: 10 mg via ORAL
  Filled 2020-05-03 (×2): qty 1

## 2020-05-03 MED ORDER — KETOROLAC TROMETHAMINE 15 MG/ML IJ SOLN
15.0000 mg | Freq: Four times a day (QID) | INTRAMUSCULAR | Status: DC
  Administered 2020-05-03 – 2020-05-04 (×3): 15 mg via INTRAVENOUS
  Filled 2020-05-03 (×3): qty 1

## 2020-05-03 MED ORDER — HYDROMORPHONE HCL 2 MG/ML IJ SOLN
2.0000 mg | Freq: Once | INTRAMUSCULAR | Status: AC | PRN
  Administered 2020-05-03: 2 mg via INTRAVENOUS
  Filled 2020-05-03: qty 1

## 2020-05-03 MED ORDER — SENNOSIDES-DOCUSATE SODIUM 8.6-50 MG PO TABS
1.0000 | ORAL_TABLET | Freq: Two times a day (BID) | ORAL | Status: DC
  Administered 2020-05-03 – 2020-05-04 (×2): 1 via ORAL
  Filled 2020-05-03 (×2): qty 1

## 2020-05-03 MED ORDER — DEXTROSE-NACL 5-0.45 % IV SOLN: INTRAVENOUS | Status: DC

## 2020-05-03 MED ORDER — GABAPENTIN 300 MG PO CAPS
300.0000 mg | ORAL_CAPSULE | Freq: Three times a day (TID) | ORAL | Status: DC
  Administered 2020-05-03 – 2020-05-04 (×2): 300 mg via ORAL
  Filled 2020-05-03 (×2): qty 1

## 2020-05-03 MED ORDER — HYDROMORPHONE 1 MG/ML IV SOLN
INTRAVENOUS | Status: DC
Start: 2020-05-03 — End: 2020-05-04
  Administered 2020-05-04: 30 mg via INTRAVENOUS
  Filled 2020-05-03: qty 30

## 2020-05-03 MED ORDER — FAMOTIDINE 20 MG PO TABS
20.0000 mg | ORAL_TABLET | Freq: Two times a day (BID) | ORAL | Status: DC
  Administered 2020-05-03 – 2020-05-04 (×2): 20 mg via ORAL
  Filled 2020-05-03 (×2): qty 1

## 2020-05-03 MED ORDER — ENOXAPARIN SODIUM 40 MG/0.4ML ~~LOC~~ SOLN
40.0000 mg | SUBCUTANEOUS | Status: DC
  Administered 2020-05-03: 40 mg via SUBCUTANEOUS
  Filled 2020-05-03: qty 0.4

## 2020-05-03 MED ORDER — ONDANSETRON HCL 4 MG/2ML IJ SOLN: 4.0000 mg | INTRAMUSCULAR | Status: DC | PRN

## 2020-05-03 MED ORDER — SODIUM CHLORIDE 0.9 % IV SOLN
25.0000 mg | INTRAVENOUS | Status: DC | PRN
  Filled 2020-05-03: qty 0.5

## 2020-05-03 MED ORDER — POLYETHYLENE GLYCOL 3350 17 G PO PACK: 17.0000 g | PACK | Freq: Every day | ORAL | Status: DC | PRN

## 2020-05-03 MED ORDER — FAMOTIDINE IN NACL 20-0.9 MG/50ML-% IV SOLN: 20.0000 mg | Freq: Two times a day (BID) | INTRAVENOUS | Status: DC

## 2020-05-03 MED ORDER — MELATONIN 5 MG PO TABS: 10.0000 mg | ORAL_TABLET | Freq: Every evening | ORAL | Status: DC | PRN

## 2020-05-03 MED ORDER — DEXTROSE-NACL 5-0.45 % IV SOLN: INTRAVENOUS | Status: AC

## 2020-05-03 MED ORDER — SODIUM CHLORIDE 0.9% FLUSH: 9.0000 mL | INTRAVENOUS | Status: DC | PRN

## 2020-05-03 MED ORDER — NALOXONE HCL 0.4 MG/ML IJ SOLN: 0.4000 mg | INTRAMUSCULAR | Status: DC | PRN

## 2020-05-03 MED ORDER — DIPHENHYDRAMINE HCL 25 MG PO CAPS: 25.0000 mg | ORAL_CAPSULE | ORAL | Status: DC | PRN

## 2020-05-03 MED ORDER — KETOROLAC TROMETHAMINE 15 MG/ML IJ SOLN
15.0000 mg | INTRAMUSCULAR | Status: AC
  Administered 2020-05-03: 15 mg via INTRAVENOUS
  Filled 2020-05-03: qty 1

## 2020-05-03 MED ORDER — ONDANSETRON HCL 4 MG PO TABS: 4.0000 mg | ORAL_TABLET | ORAL | Status: DC | PRN

## 2020-05-03 NOTE — ED Notes (Signed)
Pharmacy called and requested to verify medications

## 2020-05-03 NOTE — H&P (Signed)
History and Physical    Aleyah Balik EPP:[id number] DOB: 1970/11/09 DOA: 05/03/2020  PCP: Massie Maroon, FNP  Patient coming from: Home  I have personally briefly reviewed patient's old medical records in [facility] Link  Chief Complaint: Sickle cell pain  HPI: [name] Fentress is a 49 y.o. female with medical history significant for sickle cell disease, chronic pain syndrome, anemia of chronic disease, opiate dependence and tolerance who presents to the ED for evaluation of sickle cell pain.  Patient last admitted 04/20/2020-04/22/2020 for sickle cell pain crisis.  She was discharged with prescription for oxycodone 10 mg q6h prn #60 which was filled 04/23/2020 per [place] controlled substance database.  Patient states she was doing well for about 2 days after recent discharge then her pain began again in her right lower extremity.  He has been progressing since then and has been unrelieved by her home pain medications.  She says this pain is similar to her prior sickle cell crisis.  Patient otherwise denies any subjective fevers, chills, diaphoresis, chest pain, dyspnea, cough, abdominal pain, nausea, vomiting, dysuria, or swelling of her lower extremities.  ED Course:  Initial vitals showed BP 161/61, pulse 79, RR 20, temp 99.5 F, SPO2 96% on room air.  Labs show WBC 9.7, hemoglobin 10.3, platelets 409,000, sodium 140, potassium 3.9, bicarb 24, BUN 7, creatinine 0.73, serum glucose 81.  SARS-CoV-2 PCR is negative.  I-STAT beta-hCG <5.0.  Patient was given IV Dilaudid 2 g x 2, IV Toradol 15 mg x 1 without significant improvement in pain.  The hospitalist service was consulted to admit for further evaluation and management.  Review of Systems: All systems reviewed and are negative except as documented in history of present illness above.   Past Medical History:  Diagnosis Date  . 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 Opioid dependence in remission (HCC)   . Sickle cell crisis  [facility])     Past Surgical History:  Procedure Laterality Date  . APPENDECTOMY    . CESAREAN SECTION    . OTHER SURGICAL HISTORY     c-section    Social History:  reports that she has been smoking cigars. She has been smoking about 0.00 packs per day. She has never used smokeless tobacco. She reports current alcohol use. She reports that she does not use drugs.  No Known Allergies  Family History  Problem Relation Age of Onset  . Stroke Neg Hx        none that she knows of   . Seizures Neg Hx      Prior to Admission medications   Medication Sig Start Date End Date Taking? Authorizing Provider  acetaminophen (TYLENOL) 500 MG tablet Take 1,000 mg by mouth every 6 (six) hours as needed for moderate pain.   Yes [provider]  Aspirin-Acetaminophen-Caffeine (GOODY HEADACHE PO) Take 1 packet by mouth every 8 (eight) hours as needed (pain/headache).    Yes [provider]  ergocalciferol (VITAMIN D2) 1.25 MG (50000 UT) capsule Take 1 capsule (50,000 Units total) by mouth once a week. 10/16/19  Yes Massie Maroon, FNP  gabapentin (NEURONTIN) 300 MG capsule Take 1 capsule (300 mg total) by mouth 3 (three) times daily. 03/11/20  Yes Massie Maroon, FNP  ibuprofen (ADVIL) 800 MG tablet TAKE 1 TABLET(800 MG) BY MOUTH EVERY 8 HOURS AS NEEDED Patient taking differently: Take 800 mg by mouth every 8 (eight) hours as needed for moderate pain. 10/14/19  Yes Massie Maroon, FNP  Melatonin 10 MG TABS Take 10 mg by mouth at bedtime.   Yes [provider]  oxyCODONE (OXYCONTIN) 10 mg 12 hr tablet Take 1 tablet (10 mg total) by mouth every 12 (twelve) hours. Patient taking differently: Take 10 mg by mouth 2 (two) times daily as needed (pain). 04/15/20 05/15/20 Yes Massie Maroon, FNP  Oxycodone HCl 10 MG TABS Take 1 tablet (10 mg total) by mouth every 6 (six) hours as needed. Patient taking differently: Take 10 mg by mouth every 6 (six) hours as needed (pain). 04/23/20   Yes Massie Maroon, FNP    Physical Exam: Vitals:   05/03/20 1930 05/03/20 1941 05/03/20 2000 05/03/20 2100  BP:  129/81 116/69 109/66  Pulse:  72 69 69  Resp:  18 18   Temp: 98.7 F (37.1 C)     TempSrc: Oral     SpO2:  94% 97% 97%   Constitutional: Resting in bed, tearful and appears uncomfortable but otherwise calm and appropriate Eyes: PERRL, lids and conjunctivae normal ENMT: Mucous membranes are moist. Posterior pharynx clear of any exudate or lesions.Normal dentition.  Neck: normal, supple, no masses. Respiratory: clear to auscultation bilaterally, no wheezing, no crackles. Normal respiratory effort. No accessory muscle use.  Cardiovascular: Regular rate and rhythm, no murmurs / rubs / gallops. No extremity edema. 2+ pedal pulses. Abdomen: no tenderness, no masses palpated. No hepatosplenomegaly. Bowel sounds positive.  Musculoskeletal: no clubbing / cyanosis. No joint deformity upper and lower extremities. Good ROM, no contractures. Normal muscle tone.  Skin: no rashes, lesions, ulcers. No induration Neurologic: CN 2-12 grossly intact. Sensation intact, Strength 5/5 in all 4.  Psychiatric: Normal judgment and insight. Alert and oriented x 3.   Labs on Admission: I have personally reviewed following labs and imaging studies  CBC: Recent Labs  Lab 05/03/20 1829  WBC 9.7  NEUTROABS 6.0  HGB 10.3*  HCT 27.7*  MCV 85.2  PLT 409*   Basic Metabolic Panel: Recent Labs  Lab 05/03/20 1829  NA 140  K 3.9  CL 107  CO2 24  GLUCOSE 81  BUN 7  CREATININE 0.73  CALCIUM 9.3   GFR: Estimated Creatinine Clearance: 71.5 mL/min (by C-G formula based on SCr of 0.73 mg/dL). Liver Function Tests: Recent Labs  Lab 05/03/20 1829  AST 17  ALT 12  ALKPHOS 36*  BILITOT 1.3*  PROT 7.3  ALBUMIN 4.4   No results for input(s): LIPASE, AMYLASE in the last 168 hours. No results for input(s): AMMONIA in the last 168 hours. Coagulation Profile: No results for input(s): INR,  PROTIME in the last 168 hours. Cardiac Enzymes: No results for input(s): CKTOTAL, CKMB, CKMBINDEX, TROPONINI in the last 168 hours. BNP (last 3 results) No results for input(s): PROBNP in the last 8760 hours. HbA1C: No results for input(s): HGBA1C in the last 72 hours. CBG: No results for input(s): GLUCAP in the last 168 hours. Lipid Profile: No results for input(s): CHOL, HDL, LDLCALC, TRIG, CHOLHDL, LDLDIRECT in the last 72 hours. Thyroid Function Tests: No results for input(s): TSH, T4TOTAL, FREET4, T3FREE, THYROIDAB in the last 72 hours. Anemia Panel: Recent Labs    05/03/20 1829  RETICCTPCT 3.8*   Urine analysis:    Component Value Date/Time   COLORURINE YELLOW 10/24/2019 0230   APPEARANCEUR CLEAR 10/24/2019 0230   LABSPEC 1.016 10/24/2019 0230   PHURINE 5.0 10/24/2019 0230   GLUCOSEU NEGATIVE 10/24/2019 0230   HGBUR NEGATIVE 10/24/2019 0230   BILIRUBINUR neg 04/07/2020 1014  KETONESUR NEGATIVE 10/24/2019 0230   PROTEINUR Negative 04/07/2020 1014   PROTEINUR NEGATIVE 10/24/2019 0230   UROBILINOGEN 0.2 04/07/2020 1014   UROBILINOGEN 1.0 12/25/2012 1755   NITRITE neg 04/07/2020 1014   NITRITE NEGATIVE 10/24/2019 0230   LEUKOCYTESUR Small (1+) (A) 04/07/2020 1014   LEUKOCYTESUR TRACE (A) 10/24/2019 0230    Radiological Exams on Admission: No results found.  EKG: Not performed.  Assessment/Plan Principal Problem:   Sickle cell pain crisis [facility]) Active Problems:   Anemia of chronic disease  Shaunda Paulhus is a 49 y.o. female with medical history significant for sickle cell disease, chronic pain syndrome, anemia of chronic disease, opiate dependence and tolerance who is admitted with sickle cell pain crisis.  Sickle cell pain crisis: -Start weight-based Dilaudid PCA pump -IV Toradol 15 mg every 6 hours -Continue home dose OxyContin 10 mg q12h -Continue IV D5-1/2 NS overnight  Anemia of chronic disease: Hemoglobin stable, no indication for transfusion.  DVT  prophylaxis: Lovenox Code Status: Full code, confirmed with patient Family Communication: Discussed with patient, she has discussed with family Disposition Plan: From home and likely discharge to home pending adequate pain control Consults called: None Admission status:  Status is: Observation  The patient remains OBS appropriate and will d/c before 2 midnights.  Dispo: The patient is from: Home              Anticipated d/c is to: Home              Anticipated d/c date is: 1 day              Patient currently is not medically stable to d/c.  Darreld Mclean MD [facility]  If 7PM-7AM, please contact night-coverage [email or web address]  05/03/2020, 9:23 PM

## 2020-05-03 NOTE — Telephone Encounter (Signed)
Patient contacted On-call service for refill on Oxy-IR. She is a current patient of [place] Hollis, NP. She state that her pain is 3/10 in her right leg, but she feels that her pain will increase. Patient received last refill on 04/23/2020 for short-acting Oxycodone and is not due for next refill until 05/07/2020. She is fearful of contracting coronavirus because her daughter recently visited her home and has since found out that her daughter's roommate has tested positive for the virus. Denies signs and symptoms of chest pain, shortness of breath, and respiratory distress. She states that she continue with other prescribed pain medications and will report to ED for testing at this time.

## 2020-05-03 NOTE — ED Triage Notes (Signed)
Patient c/o sickle cell pain in bilateral legs x2 days. Reports she is out of pain medications.

## 2020-05-03 NOTE — ED Provider Notes (Signed)
[facility]-EMERGENCY DEPT Provider Note   CSN: [id number] Arrival date & time: 05/03/20  1523     History Chief Complaint  Patient presents with  . Sickle Cell Pain Crisis    Lindsey [name] is a 49 y.o. female.  Patient is a 49 year old female who has a history of sickle cell anemia on chronic opioids who presents with right leg pain which is consistent with her sickle cell pain crises.  She says is been going on for the last couple days.  She took her last oxycodone today.  She denies any fevers.  No chest pain or shortness of breath.  She does have a little bit of a scratchy throat but no rhinorrhea.  No coughing or chest congestion.  She had a recent exposure to Covid 2 days ago.  No abdominal pain no vomiting or diarrhea.        Past Medical History:  Diagnosis Date  . 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 Opioid dependence in remission (HCC)   . Sickle cell crisis [facility])     Patient Active Problem List   Diagnosis Date Noted  . Acute pain of left shoulder   . Right leg pain   . Sickle cell anemia with crisis (HCC) 09/10/2019  . Anemia of chronic disease 09/10/2019  . Current mild episode of major depressive disorder (HCC)   . Sickle cell crisis (HCC) 02/28/2019  . Opioid dependence in remission (HCC) 10/01/2017  . Sickle cell disease (HCC) 10/01/2017  . Chronic pain 10/01/2017  . 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 Leukocytosis 09/03/2016  . Sickle cell pain crisis (HCC) 07/20/2012  . 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 APPENDECTOMY    . CESAREAN SECTION    . OTHER SURGICAL HISTORY     c-section     OB History   No obstetric history on file.     Family History  Problem Relation Age of Onset  . Stroke Neg Hx        none that she knows of   . Seizures Neg Hx     Social History   Tobacco Use  . Smoking status: Current Every Day Smoker    Packs/day:  0.00    Types: Cigars  . Smokeless tobacco: Never Used  Vaping Use  . Vaping Use: Former  Substance Use Topics  . Alcohol use: Yes    Comment: occasionally  . Drug use: No    Home Medications Prior to Admission medications   Medication Sig Start Date End Date Taking? Authorizing Provider  acetaminophen (TYLENOL) 500 MG tablet Take 1,000 mg by mouth every 6 (six) hours as needed for moderate pain.   Yes [provider]  Aspirin-Acetaminophen-Caffeine (GOODY HEADACHE PO) Take 1 packet by mouth every 8 (eight) hours as needed (pain/headache).    Yes [provider]  ergocalciferol (VITAMIN D2) 1.25 MG (50000 UT) capsule Take 1 capsule (50,000 Units total) by mouth once a week. 10/16/19  Yes Massie Maroon, FNP  gabapentin (NEURONTIN) 300 MG capsule Take 1 capsule (300 mg total) by mouth 3 (three) times daily. 03/11/20  Yes Massie Maroon, FNP  ibuprofen (ADVIL) 800 MG tablet TAKE 1 TABLET(800 MG) BY MOUTH EVERY 8 HOURS AS NEEDED Patient taking differently: Take 800 mg by mouth every 8 (eight) hours as needed for moderate pain. 10/14/19  Yes Massie Maroon, FNP  Melatonin 10 MG TABS  Take 10 mg by mouth at bedtime.   Yes [provider]  oxyCODONE (OXYCONTIN) 10 mg 12 hr tablet Take 1 tablet (10 mg total) by mouth every 12 (twelve) hours. Patient taking differently: Take 10 mg by mouth 2 (two) times daily as needed (pain). 04/15/20 05/15/20 Yes Massie Maroon, FNP  Oxycodone HCl 10 MG TABS Take 1 tablet (10 mg total) by mouth every 6 (six) hours as needed. Patient taking differently: Take 10 mg by mouth every 6 (six) hours as needed (pain). 04/23/20  Yes Massie Maroon, FNP    Allergies    Patient has no known allergies.  Review of Systems   Review of Systems  Constitutional: Negative for chills, diaphoresis, fatigue and fever.  HENT: Positive for sore throat. Negative for congestion, rhinorrhea and sneezing.   Eyes: Negative.   Respiratory: Negative  for cough, chest tightness and shortness of breath.   Cardiovascular: Negative for chest pain and leg swelling.  Gastrointestinal: Negative for abdominal pain, blood in stool, diarrhea, nausea and vomiting.  Genitourinary: Negative for difficulty urinating, flank pain, frequency and hematuria.  Musculoskeletal: Positive for arthralgias. Negative for back pain.  Skin: Negative for rash.  Neurological: Negative for dizziness, speech difficulty, weakness, numbness and headaches.    Physical Exam Updated Vital Signs BP 116/69   Pulse 69   Temp 98.7 F (37.1 C) (Oral)   Resp 18   LMP 04/07/2020 (Approximate)   SpO2 97%   Physical Exam Constitutional:      Appearance: She is well-developed and well-nourished.  HENT:     Head: Normocephalic and atraumatic.     Mouth/Throat:     Comments: No erythema or exudates, uvula is midline, no trismus Eyes:     Pupils: Pupils are equal, round, and reactive to light.  Cardiovascular:     Rate and Rhythm: Normal rate and regular rhythm.     Heart sounds: Normal heart sounds.  Pulmonary:     Effort: Pulmonary effort is normal. No respiratory distress.     Breath sounds: Normal breath sounds. No wheezing or rales.  Chest:     Chest wall: No tenderness.  Abdominal:     General: Bowel sounds are normal.     Palpations: Abdomen is soft.     Tenderness: There is no abdominal tenderness. There is no guarding or rebound.  Musculoskeletal:        General: No edema. Normal range of motion.     Cervical back: Normal range of motion and neck supple.     Comments: Pain to the right lower leg.  No warmth or erythema.  No significant swelling.  Compartments are soft.  Lymphadenopathy:     Cervical: No cervical adenopathy.  Skin:    General: Skin is warm and dry.     Findings: No rash.  Neurological:     Mental Status: She is alert and oriented to person, place, and time.  Psychiatric:        Mood and Affect: Mood and affect normal.     ED  Results / Procedures / Treatments   Labs (all labs ordered are listed, but only abnormal results are displayed) Labs Reviewed  COMPREHENSIVE METABOLIC PANEL - Abnormal; Notable for the following components:      Result Value   Alkaline Phosphatase 36 (*)    Total Bilirubin 1.3 (*)    All other components within normal limits  CBC WITH DIFFERENTIAL/PLATELET - Abnormal; Notable for the following components:   RBC  3.25 (*)    Hemoglobin 10.3 (*)    HCT 27.7 (*)    MCHC 37.2 (*)    Platelets 409 (*)    nRBC 0.4 (*)    All other components within normal limits  RETICULOCYTES - Abnormal; Notable for the following components:   Retic Ct Pct 3.8 (*)    RBC. 3.25 (*)    Immature Retic Fract 25.4 (*)    All other components within normal limits  RESP PANEL BY RT-PCR (FLU A&B, COVID) [id number]  I-STAT BETA HCG BLOOD, ED ([facility], [facility], [facility] ONLY)  I-STAT BETA HCG BLOOD, ED ([facility], [facility], [facility] ONLY)    EKG None  Radiology No results found.  Procedures Procedures (including critical care time)  Medications Ordered in ED Medications  dextrose 5 %-0.45 % sodium chloride infusion ( Intravenous New Bag/Given 05/03/20 1821)  HYDROmorphone (DILAUDID) injection 2 mg (2 mg Intravenous Given 05/03/20 1822)  HYDROmorphone (DILAUDID) injection 2 mg (2 mg Intravenous Given 05/03/20 1856)  ketorolac (TORADOL) 15 MG/ML injection 15 mg (15 mg Intravenous Given 05/03/20 1823)    ED Course  I have reviewed the triage vital signs and the nursing notes.  Pertinent labs & imaging results that were available during my care of the patient were reviewed by me and considered in my medical decision making (see chart for details).    MDM Rules/Calculators/A&P                          Patient is a 49 year old female who presents with sickle cell pain crises.  Her pain is consistent with her prior pain crises.  She does not have any symptoms that sound more concerning for acute chest syndrome.  She is afebrile.  Her labs  are similar to prior values.  Her Covid test is negative.  She was started on the pain protocol with IV pain medications and IV fluids.  She did not get any relief with this treatment and therefore was admitted to the hospital for further treatment.  I spoke with Dr. Allena Katz who will admit the patient.  CRITICAL CARE Performed by: Rolan Bucco Total critical care time: 30 minutes Critical care time was exclusive of separately billable procedures and treating other patients. Critical care was necessary to treat or prevent imminent or life-threatening deterioration. Critical care was time spent personally by me on the following activities: development of treatment plan with patient and/or surrogate as well as nursing, discussions with consultants, evaluation of patient's response to treatment, examination of patient, obtaining history from patient or surrogate, ordering and performing treatments and interventions, ordering and review of laboratory studies, ordering and review of radiographic studies, pulse oximetry and re-evaluation of patient's condition.  Final Clinical Impression(s) / ED Diagnoses Final diagnoses:  Sickle cell pain crisis [facility])    Rx / DC Orders ED Discharge Orders    None       Rolan Bucco, MD 05/03/20 2054

## 2020-05-04 DIAGNOSIS — D5700 Hb-SS disease with crisis, unspecified: Secondary | ICD-10-CM | POA: Diagnosis not present

## 2020-05-04 DIAGNOSIS — D638 Anemia in other chronic diseases classified elsewhere: Secondary | ICD-10-CM | POA: Diagnosis not present

## 2020-05-04 DIAGNOSIS — G894 Chronic pain syndrome: Secondary | ICD-10-CM

## 2020-05-04 MED ORDER — HYDROMORPHONE HCL 2 MG/ML IJ SOLN
2.0000 mg | INTRAMUSCULAR | Status: DC | PRN
  Administered 2020-05-04 (×2): 2 mg via INTRAVENOUS
  Filled 2020-05-04 (×2): qty 1

## 2020-05-04 MED ORDER — OXYCODONE HCL 10 MG PO TABS: 10.0000 mg | ORAL_TABLET | Freq: Four times a day (QID) | ORAL | 0 refills | Status: DC | PRN

## 2020-05-04 NOTE — ED Notes (Signed)
Unable to reach RN for report at this time 

## 2020-05-04 NOTE — Discharge Instructions (Signed)
 Chronic Pain, Adult Chronic pain is a type of pain that lasts or keeps coming back (recurs) for at least six months. You may have chronic headaches, abdominal pain, or body pain. Chronic pain may be related to an illness, such as fibromyalgia or complex regional pain syndrome. Sometimes the cause of chronic pain is not known. Chronic pain can make it hard for you to do daily activities. If not treated, chronic pain can lead to other health problems, including anxiety and depression. Treatment depends on the cause and severity of your pain. You may need to work with a pain specialist to come up with a treatment plan. The plan may include medicine, counseling, and physical therapy. Many people benefit from a combination of two or more types of treatment to control their pain. Follow these instructions at home: Lifestyle  Consider keeping a pain diary to share with your health care providers.  Consider talking with a mental health care provider (psychologist) about how to cope with chronic pain.  Consider joining a chronic pain support group.  Try to control or lower your stress levels. Talk to your health care provider about strategies to do this. General instructions   Take over-the-counter and prescription medicines only as told by your health care provider.  Follow your treatment plan as told by your health care provider. This may include: ? Gentle, regular exercise. ? Eating a healthy diet that includes foods such as vegetables, fruits, fish, and lean meats. ? Cognitive or behavioral therapy. ? Working with a physical therapist. ? Meditation or yoga. ? Acupuncture or massage therapy. ? Aroma, color, light, or sound therapy. ? Local electrical stimulation. ? Shots (injections) of numbing or pain-relieving medicines into the spine or the area of pain.  Check your pain level as told by your health care provider. Ask your health care provider if you should use a pain scale.  Learn as  much as you can about how to manage your chronic pain. Ask your health care provider if an intensive pain rehabilitation program or a chronic pain specialist would be helpful.  Keep all follow-up visits as told by your health care provider. This is important. Contact a health care provider if:  Your pain gets worse.  You have new pain.  You have trouble sleeping.  You have trouble doing your normal activities.  Your pain is not controlled with treatment.  Your have side effects from pain medicine.  You feel weak. Get help right away if:  You lose feeling or have numbness in your body.  You lose control of bowel or bladder function.  Your pain suddenly gets much worse.  You develop shaking or chills.  You develop confusion.  You develop chest pain.  You have trouble breathing or shortness of breath.  You pass out.  You have thoughts about hurting yourself or others. This information is not intended to replace advice given to you by your health care provider. Make sure you discuss any questions you have with your health care provider. Document Revised: 04/07/2017 Document Reviewed: 10/13/2015 Elsevier Patient Education  2020 Elsevier Inc. Sickle Cell Anemia, Adult  Sickle cell anemia is a condition where your red blood cells are shaped like sickles. Red blood cells carry oxygen through the body. Sickle-shaped cells do not live as long as normal red blood cells. They also clump together and block blood from flowing through the blood vessels. This prevents the body from getting enough oxygen. Sickle cell anemia causes organ damage and   pain. It also increases the risk of infection. Follow these instructions at home: Medicines  Take over-the-counter and prescription medicines only as told by your doctor.  If you were prescribed an antibiotic medicine, take it as told by your doctor. Do not stop taking the antibiotic even if you start to feel better.  If you develop a  fever, do not take medicines to lower the fever right away. Tell your doctor about the fever. Managing pain, stiffness, and swelling  Try these methods to help with pain: ? Use a heating pad. ? Take a warm bath. ? Distract yourself, such as by watching TV. Eating and drinking  Drink enough fluid to keep your pee (urine) clear or pale yellow. Drink more in hot weather and during exercise.  Limit or avoid alcohol.  Eat a healthy diet. Eat plenty of fruits, vegetables, whole grains, and lean protein.  Take vitamins and supplements as told by your doctor. Traveling  When traveling, keep these with you: ? Your medical information. ? The names of your doctors. ? Your medicines.  If you need to take an airplane, talk to your doctor first. Activity  Rest often.  Avoid exercises that make your heart beat much faster, such as jogging. General instructions  Do not use products that have nicotine or tobacco, such as cigarettes and e-cigarettes. If you need help quitting, ask your doctor.  Consider wearing a medical alert bracelet.  Avoid being in high places (high altitudes), such as mountains.  Avoid very hot or cold temperatures.  Avoid places where the temperature changes a lot.  Keep all follow-up visits as told by your doctor. This is important. Contact a doctor if:  A joint hurts.  Your feet or hands hurt or swell.  You feel tired (fatigued). Get help right away if:  You have symptoms of infection. These include: ? Fever. ? Chills. ? Being very tired. ? Irritability. ? Poor eating. ? Throwing up (vomiting).  You feel dizzy or faint.  You have new stomach pain, especially on the left side.  You have a an erection (priapism) that lasts more than 4 hours.  You have numbness in your arms or legs.  You have a hard time moving your arms or legs.  You have trouble talking.  You have pain that does not go away when you take medicine.  You are short of  breath.  You are breathing fast.  You have a long-term cough.  You have pain in your chest.  You have a bad headache.  You have a stiff neck.  Your stomach looks bloated even though you did not eat much.  Your skin is pale.  You suddenly cannot see well. Summary  Sickle cell anemia is a condition where your red blood cells are shaped like sickles.  Follow your doctor's advice on ways to manage pain, food to eat, activities to do, and steps to take for safe travel.  Get medical help right away if you have any signs of infection, such as a fever. This information is not intended to replace advice given to you by your health care provider. Make sure you discuss any questions you have with your health care provider. Document Revised: 08/17/2018 Document Reviewed: 05/31/2016 Elsevier Patient Education  2020 Elsevier Inc.  

## 2020-05-04 NOTE — ED Notes (Signed)
Pt ambulatory to bathroom without assistance.

## 2020-05-04 NOTE — Discharge Summary (Signed)
Physician Discharge Summary  Lindsey [name] QZE:[id number] DOB: 06-30-1970 DOA: 05/03/2020  PCP: Massie Maroon, FNP  Admit date: 05/03/2020  Discharge date: 05/04/2020  Discharge Diagnoses:  Principal Problem:   Sickle cell pain crisis (HCC) Active Problems:   Anemia of chronic disease   Discharge Condition: Stable  Disposition:   Follow-up Information    Massie Maroon, FNP. Schedule an appointment as soon as possible for a visit in 1 week(s).   Specialty: Family Medicine Contact information: [address] [employer] [place] [address] [phone]              Pt is discharged home in good condition and is to follow up with Massie Maroon, FNP this week to have labs evaluated. LHTDSKA [name] is instructed to increase activity slowly and balance with rest for the next few days, and use prescribed medication to complete treatment of pain  Diet: Regular Wt Readings from Last 3 Encounters:  04/20/20 63.5 kg  04/07/20 63.9 kg  02/19/20 65.8 kg    History of present illness:  Lindsey [name] is a 49 y.o. female with medical history significant for sickle cell disease, chronic pain syndrome, anemia of chronic disease, opiate dependence and tolerance who presents to the ED for evaluation of sickle cell pain.  Patient last admitted 04/20/2020-04/22/2020 for sickle cell pain crisis.  She was discharged with prescription for oxycodone 10 mg q6h prn #60 which was filled 04/23/2020 per [place] controlled substance database.  Patient states she was doing well for about 2 days after recent discharge then her pain began again in her right lower extremity and has been progressing since then and has been unrelieved by her home pain medications. She says this pain is similar to her prior sickle cell crisis.  Patient otherwise denies any subjective fevers, chills, diaphoresis, chest pain, dyspnea, cough, abdominal pain, nausea, vomiting, dysuria, or swelling of her lower  extremities.  ED Course:  Initial vitals showed BP 161/61, pulse 79, RR 20, temp 99.5 F, SPO2 96% on room air.  Labs show WBC 9.7, hemoglobin 10.3, platelets 409,000, sodium 140, potassium 3.9, bicarb 24, BUN 7, creatinine 0.73, serum glucose 81.  SARS-CoV-2 PCR is negative.  I-STAT beta-hCG <5.0.  Patient was given IV Dilaudid 2 g x 2, IV Toradol 15 mg x 1 without significant improvement in pain.  The hospitalist service was consulted to admit for further evaluation and management.  Hospital Course:  Patient was admitted for sickle cell pain crisis and managed appropriately with IVF, IV Dilaudid via PCA and IV Toradol, as well as other adjunct therapies per sickle cell pain management protocols.  Patient claims her major reason for agreeing to be on admission was because she has no pain medications at home.  Her pain quickly returned to baseline and is at the time of this encounter, pain is at 3/10 which is a goal.  She requested to be discharged if she could have a prescription for her home pain medications.  This was obliged and patient will be discharged home.  She did not require any blood transfusion.  She had no fever.  She is tolerating p.o. intake with no restrictions and ambulating well with no concerns.  A 15-day prescription of her short acting pain medication was called to her pharmacy.  Patient was therefore discharged home today in a hemodynamically stable condition.   [name] will follow-up with PCP within 1 week of this discharge. [name] was counseled extensively about nonpharmacologic means of pain  management, patient verbalized understanding and was appreciative of  the care received during this admission.    We discussed the need for good hydration, monitoring of hydration status, avoidance of heat, cold, stress, and infection triggers. We discussed the need to be adherent with taking Hydrea and other home medications. Patient was reminded of the need to seek medical  attention immediately if any symptom of bleeding, anemia, or infection occurs.  Discharge Exam: Vitals:   05/04/20 0950 05/04/20 1120  BP: 114/86   Pulse: 60   Resp: 16 10  Temp: 98.6 F (37 C)   SpO2: 100% 100%   Vitals:   05/04/20 0800 05/04/20 0905 05/04/20 0950 05/04/20 1120  BP: 100/64 115/67 114/86   Pulse: (!) 55 (!) 57 60   Resp: 14 20 16 10   Temp:   98.6 F (37 C)   TempSrc:   Oral   SpO2: 99% 98% 100% 100%   General appearance : Awake, alert, not in any distress. Speech Clear. Not toxic looking HEENT: Atraumatic and Normocephalic, pupils equally reactive to light and accomodation Neck: Supple, no JVD. No cervical lymphadenopathy.  Chest: Good air entry bilaterally, no added sounds  CVS: S1 S2 regular, no murmurs.  Abdomen: Bowel sounds present, Non tender and not distended with no gaurding, rigidity or rebound. Extremities: B/L Lower Ext shows no edema, both legs are warm to touch Neurology: Awake alert, and oriented X 3, CN II-XII intact, Non focal Skin: No Rash  Discharge Instructions  Discharge Instructions    Diet - low sodium heart healthy   Complete by: As directed    Increase activity slowly   Complete by: As directed      Allergies as of 05/04/2020   No Known Allergies     Medication List    TAKE these medications   acetaminophen 500 MG tablet Commonly known as: TYLENOL Take 1,000 mg by mouth every 6 (six) hours as needed for moderate pain.   ergocalciferol 1.25 MG (50000 UT) capsule Commonly known as: VITAMIN D2 Take 1 capsule (50,000 Units total) by mouth once a week.   gabapentin 300 MG capsule Commonly known as: NEURONTIN Take 1 capsule (300 mg total) by mouth 3 (three) times daily.   GOODY HEADACHE PO Take 1 packet by mouth every 8 (eight) hours as needed (pain/headache).   ibuprofen 800 MG tablet Commonly known as: ADVIL TAKE 1 TABLET(800 MG) BY MOUTH EVERY 8 HOURS AS NEEDED What changed: See the new instructions.   Melatonin  10 MG Tabs Take 10 mg by mouth at bedtime.   oxyCODONE 10 mg 12 hr tablet Commonly known as: OXYCONTIN Take 1 tablet (10 mg total) by mouth every 12 (twelve) hours. What changed:   when to take this  reasons to take this   Oxycodone HCl 10 MG Tabs Take 1 tablet (10 mg total) by mouth every 6 (six) hours as needed for up to 15 days (pain). What changed: reasons to take this       The results of significant diagnostics from this hospitalization (including imaging, microbiology, ancillary and laboratory) are listed below for reference.    Significant Diagnostic Studies: No results found.  Microbiology: Recent Results (from the past 240 hour(s))  Resp Panel by RT-PCR (Flu A&B, Covid) Nasopharyngeal Swab     Status: None   Collection Time: 05/03/20  6:29 PM   Specimen: Nasopharyngeal Swab; Nasopharyngeal(NP) swabs in vial transport medium  Result Value Ref Range Status   SARS Coronavirus 2 by  RT PCR NEGATIVE NEGATIVE Final    Comment: (NOTE) SARS-CoV-2 target nucleic acids are NOT DETECTED.  The SARS-CoV-2 RNA is generally detectable in upper respiratory specimens during the acute phase of infection. The lowest concentration of SARS-CoV-2 viral copies this assay can detect is 138 copies/mL. A negative result does not preclude SARS-Cov-2 infection and should not be used as the sole basis for treatment or other patient management decisions. A negative result may occur with  improper specimen collection/handling, submission of specimen other than nasopharyngeal swab, presence of viral mutation(s) within the areas targeted by this assay, and inadequate number of viral copies(<138 copies/mL). A negative result must be combined with clinical observations, patient history, and epidemiological information. The expected result is Negative.  Fact Sheet for Patients:  BloggerCourse.com  Fact Sheet for Healthcare Providers:   SeriousBroker.it  This test is no t yet approved or cleared by the [place] FDA and  has been authorized for detection and/or diagnosis of SARS-CoV-2 by FDA under an Emergency Use Authorization (EUA). This EUA will remain  in effect (meaning this test can be used) for the duration of the COVID-19 declaration under Section 564(b)(1) of the Act, 21 U.S.C.section 360bbb-3(b)(1), unless the authorization is terminated  or revoked sooner.       Influenza A by PCR NEGATIVE NEGATIVE Final   Influenza B by PCR NEGATIVE NEGATIVE Final    Comment: (NOTE) The Xpert Xpress SARS-CoV-2/FLU/RSV plus assay is intended as an aid in the diagnosis of influenza from Nasopharyngeal swab specimens and should not be used as a sole basis for treatment. Nasal washings and aspirates are unacceptable for Xpert Xpress SARS-CoV-2/FLU/RSV testing.  Fact Sheet for Patients: BloggerCourse.com  Fact Sheet for Healthcare Providers: SeriousBroker.it  This test is not yet approved or cleared by the [place] FDA and has been authorized for detection and/or diagnosis of SARS-CoV-2 by FDA under an Emergency Use Authorization (EUA). This EUA will remain in effect (meaning this test can be used) for the duration of the COVID-19 declaration under Section 564(b)(1) of the Act, 21 U.S.C. section 360bbb-3(b)(1), unless the authorization is terminated or revoked.  Performed at [facility], [address], [place], [place] [address]      Labs: Basic Metabolic Panel: Recent Labs  Lab 05/03/20 1829  NA 140  K 3.9  CL 107  CO2 24  GLUCOSE 81  BUN 7  CREATININE 0.73  CALCIUM 9.3   Liver Function Tests: Recent Labs  Lab 05/03/20 1829  AST 17  ALT 12  ALKPHOS 36*  BILITOT 1.3*  PROT 7.3  ALBUMIN 4.4   No results for input(s): LIPASE, AMYLASE in the last 168 hours. No results for input(s): AMMONIA in  the last 168 hours. CBC: Recent Labs  Lab 05/03/20 1829  WBC 9.7  NEUTROABS 6.0  HGB 10.3*  HCT 27.7*  MCV 85.2  PLT 409*   Cardiac Enzymes: No results for input(s): CKTOTAL, CKMB, CKMBINDEX, TROPONINI in the last 168 hours. BNP: Invalid input(s): POCBNP CBG: No results for input(s): GLUCAP in the last 168 hours.  Time coordinating discharge: 50 minutes  Signed:  Johnthan Axtman  [facility] 05/04/2020, 2:02 PM

## 2020-05-13 ENCOUNTER — Telehealth: Payer: Self-pay | Admitting: Family Medicine

## 2020-05-13 ENCOUNTER — Other Ambulatory Visit: Payer: Self-pay | Admitting: Family Medicine

## 2020-05-13 DIAGNOSIS — G894 Chronic pain syndrome: Secondary | ICD-10-CM

## 2020-05-13 MED ORDER — OXYCODONE HCL ER 10 MG PO T12A
10.0000 mg | EXTENDED_RELEASE_TABLET | Freq: Two times a day (BID) | ORAL | 0 refills | Status: DC
Start: 2020-05-16 — End: 2020-06-29

## 2020-05-13 NOTE — Progress Notes (Signed)
Reviewed PDMP substance reporting system prior to prescribing opiate medications. No inconsistencies noted.  Meds ordered this encounter  Medications   oxyCODONE (OXYCONTIN) 10 mg 12 hr tablet    Sig: Take 1 tablet (10 mg total) by mouth every 12 (twelve) hours.    Dispense:  60 tablet    Refill:  0    Order Specific Question:   Supervising Provider    Answer:   [name], [name]      Lindsey Moore Hollis  APRN, MSN, FNP-C [facility] [address]  [place], [place] [address] [phone]  

## 2020-05-14 NOTE — Telephone Encounter (Signed)
Done

## 2020-05-17 ENCOUNTER — Other Ambulatory Visit: Payer: Self-pay

## 2020-05-17 DIAGNOSIS — G894 Chronic pain syndrome: Secondary | ICD-10-CM

## 2020-05-18 ENCOUNTER — Telehealth: Payer: Self-pay | Admitting: Family Medicine

## 2020-05-18 ENCOUNTER — Other Ambulatory Visit: Payer: Self-pay | Admitting: Family Medicine

## 2020-05-18 DIAGNOSIS — G894 Chronic pain syndrome: Secondary | ICD-10-CM

## 2020-05-18 MED ORDER — OXYCODONE HCL 10 MG PO TABS: 10.0000 mg | ORAL_TABLET | Freq: Four times a day (QID) | ORAL | 0 refills | Status: DC | PRN

## 2020-05-18 NOTE — Progress Notes (Signed)
Meds ordered this encounter  Medications  . Oxycodone HCl 10 MG TABS    Sig: Take 1 tablet (10 mg total) by mouth every 6 (six) hours as needed for up to 15 days (pain).    Dispense:  60 tablet    Refill:  0    Order Specific Question:   Supervising Provider    Answer:   Quentin Angst [[id number]]  Reviewed PDMP substance reporting system prior to prescribing opiate medications. No inconsistencies noted.   [name]  APRN, MSN, FNP-C Patient Care [facility] [address] [place], [place] [address] [phone]

## 2020-05-19 ENCOUNTER — Telehealth (INDEPENDENT_AMBULATORY_CARE_PROVIDER_SITE_OTHER): Payer: Medicaid Other | Admitting: Family Medicine

## 2020-05-19 DIAGNOSIS — R062 Wheezing: Secondary | ICD-10-CM | POA: Diagnosis not present

## 2020-05-19 DIAGNOSIS — R059 Cough, unspecified: Secondary | ICD-10-CM | POA: Diagnosis not present

## 2020-05-19 DIAGNOSIS — D5700 Hb-SS disease with crisis, unspecified: Secondary | ICD-10-CM

## 2020-05-19 DIAGNOSIS — R0989 Other specified symptoms and signs involving the circulatory and respiratory systems: Secondary | ICD-10-CM | POA: Diagnosis not present

## 2020-05-19 MED ORDER — AZITHROMYCIN 250 MG PO TABS: ORAL_TABLET | ORAL | 0 refills | Status: DC

## 2020-05-19 MED ORDER — ALBUTEROL SULFATE HFA 108 (90 BASE) MCG/ACT IN AERS: 2.0000 | INHALATION_SPRAY | Freq: Four times a day (QID) | RESPIRATORY_TRACT | 0 refills | Status: DC | PRN

## 2020-05-19 NOTE — Telephone Encounter (Signed)
Done

## 2020-05-19 NOTE — Progress Notes (Signed)
Virtual Visit via Telephone Note  I connected with Lindsey [name] on 05/19/20 at  2:40 PM EST by telephone and verified that I am speaking with the correct person using two identifiers.  Location: Patient: Home Provider: [place] [facility]   I discussed the limitations, risks, security and privacy concerns of performing an evaluation and management service by telephone and the availability of in person appointments. I also discussed with the patient that there may be a patient responsible charge related to this service. The patient expressed understanding and agreed to proceed.   History of Present Illness: Lindsey [name] is a 50 year old female with a medical history significant for sickle cell disease, chronic pain syndrome, opiate dependence and tolerance, and history of anemia of chronic disease presents via telephone with complaints of persistent cough, chest congestion, and wheezing for greater than 2 weeks.  Patient reports sick contact.  Her daughter had a chest cold some weeks prior and was tested for COVID-19 infection twice, both test were negative.  Patient has not been tested for COVID-19.  Also, she is not vaccinated against COVID-19 infection.  Patient has attempted over-the-counter Robitussin with minimal relief.  She denies any headache, fever, chills, or shortness of breath.   Past Medical History:  Diagnosis Date  . 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 Opioid dependence in remission (HCC)   . Sickle cell crisis [facility])    Social History   Socioeconomic History  . Marital status: Married    Spouse name: Not on file  . Number of children: 2  . Years of education: Not on file  . Highest education level: Not on file  Occupational History  . Not on file  Tobacco Use  . Smoking status: Current Every Day Smoker    Packs/day: 0.00    Types: Cigars  . Smokeless tobacco: Never Used  Vaping Use  . Vaping Use: Former  Substance and Sexual Activity  . Alcohol  use: Yes    Comment: occasionally  . Drug use: No  . Sexual activity: Yes    Birth control/protection: None  Other Topics Concern  . Not on file  Social History Narrative   Lives at home with husband and son   Right handed   Social Determinants of Health   Financial Resource Strain: Not on file  Food Insecurity: Not on file  Transportation Needs: Not on file  Physical Activity: Not on file  Stress: Not on file  Social Connections: Not on file  Intimate Partner Violence: Not on file   Review of Systems  Constitutional: Negative for chills, fever and malaise/fatigue.  HENT: Negative.   Eyes: Negative.   Respiratory: Positive for cough and wheezing. Negative for shortness of breath.   Cardiovascular: Negative.   Gastrointestinal: Negative.   Genitourinary: Negative.   Musculoskeletal: Negative.   Neurological: Negative.   Psychiatric/Behavioral: Negative.      Assessment and Plan: 1. Cough Recommend COVID 19 test. Patient expressed understanding - albuterol (VENTOLIN HFA) 108 (90 Base) MCG/ACT inhaler; Inhale 2 puffs into the lungs every 6 (six) hours as needed for wheezing or shortness of breath.  Dispense: 8 g; Refill: 0  2. Wheezing - albuterol (VENTOLIN HFA) 108 (90 Base) MCG/ACT inhaler; Inhale 2 puffs into the lungs every 6 (six) hours as needed for wheezing or shortness of breath.  Dispense: 8 g; Refill: 0  3. Chest congestion - azithromycin (ZITHROMAX) 250 MG tablet; Take 500 mg today. 250 mg on days 2-5.  Dispense: 6  tablet; Refill: 0  4. Symptoms of upper respiratory infection (URI) - azithromycin (ZITHROMAX) 250 MG tablet; Take 500 mg today. 250 mg on days 2-5.  Dispense: 6 tablet; Refill: 0 Follow Up Instructions:   Follow up as previously scheduled.  I discussed the assessment and treatment plan with the patient. The patient was provided an opportunity to ask questions and all were answered. The patient agreed with the plan and demonstrated an  understanding of the instructions.   The patient was advised to call back or seek an in-person evaluation if the symptoms worsen or if the condition fails to improve as anticipated.  I provided 10 minutes of non-face-to-face time during this encounter.   [name]  APRN, MSN, FNP-C Patient Care [facility] [address] [place], [place] [address] [phone]

## 2020-05-29 ENCOUNTER — Other Ambulatory Visit: Payer: Self-pay

## 2020-05-29 ENCOUNTER — Other Ambulatory Visit: Payer: Self-pay | Admitting: Family Medicine

## 2020-05-29 DIAGNOSIS — G894 Chronic pain syndrome: Secondary | ICD-10-CM

## 2020-05-29 MED ORDER — OXYCODONE HCL 10 MG PO TABS: 10.0000 mg | ORAL_TABLET | Freq: Four times a day (QID) | ORAL | 0 refills | Status: DC | PRN

## 2020-05-29 NOTE — Progress Notes (Signed)
Reviewed PDMP substance reporting system prior to prescribing opiate medications. No inconsistencies noted.  Meds ordered this encounter  Medications   Oxycodone HCl 10 MG TABS    Sig: Take 1 tablet (10 mg total) by mouth every 6 (six) hours as needed for up to 15 days (pain).    Dispense:  60 tablet    Refill:  0    Order Specific Question:   Supervising Provider    Answer:   [name], [name]   Lindsey Niebla Moore Leeman Johnsey  APRN, MSN, FNP-C [facility] [address]  [place], [place] [address] [phone]  

## 2020-05-30 ENCOUNTER — Other Ambulatory Visit: Payer: Self-pay | Admitting: Family Medicine

## 2020-05-30 DIAGNOSIS — R059 Cough, unspecified: Secondary | ICD-10-CM

## 2020-05-30 DIAGNOSIS — R062 Wheezing: Secondary | ICD-10-CM

## 2020-06-01 ENCOUNTER — Telehealth: Payer: Self-pay | Admitting: Family Medicine

## 2020-06-01 ENCOUNTER — Other Ambulatory Visit: Payer: Self-pay | Admitting: Family Medicine

## 2020-06-01 NOTE — Telephone Encounter (Signed)
Please see patient refill request.

## 2020-06-01 NOTE — Progress Notes (Signed)
Rx sent on 05/29/2020

## 2020-06-02 NOTE — Telephone Encounter (Signed)
Done

## 2020-06-04 ENCOUNTER — Encounter (HOSPITAL_COMMUNITY): Payer: Self-pay | Admitting: Family Medicine

## 2020-06-04 ENCOUNTER — Telehealth (HOSPITAL_COMMUNITY): Payer: Self-pay

## 2020-06-04 ENCOUNTER — Non-Acute Institutional Stay (HOSPITAL_COMMUNITY)
Admission: AD | Admit: 2020-06-04 | Discharge: 2020-06-04 | Disposition: A | Discharge status: Home / Self Care | Payer: Medicaid Other | Attending: Internal Medicine | Admitting: Internal Medicine

## 2020-06-04 DIAGNOSIS — D5700 Hb-SS disease with crisis, unspecified: Secondary | ICD-10-CM | POA: Diagnosis not present

## 2020-06-04 DIAGNOSIS — G894 Chronic pain syndrome: Secondary | ICD-10-CM | POA: Insufficient documentation

## 2020-06-04 DIAGNOSIS — F17290 Nicotine dependence, other tobacco product, uncomplicated: Secondary | ICD-10-CM | POA: Diagnosis not present

## 2020-06-04 DIAGNOSIS — F1120 Opioid dependence, uncomplicated: Secondary | ICD-10-CM | POA: Insufficient documentation

## 2020-06-04 DIAGNOSIS — Z79899 Other long term (current) drug therapy: Secondary | ICD-10-CM | POA: Diagnosis not present

## 2020-06-04 LAB — RETICULOCYTES
Immature Retic Fract: 30.4 % — ABNORMAL HIGH (ref 2.3–15.9)
RBC.: 3.34 MIL/uL — ABNORMAL LOW (ref 3.87–5.11)
Retic Count, Absolute: 120.2 10*3/uL (ref 19.0–186.0)
Retic Ct Pct: 3.6 % — ABNORMAL HIGH (ref 0.4–3.1)

## 2020-06-04 LAB — CBC WITH DIFFERENTIAL/PLATELET
Abs Immature Granulocytes: 0.04 K/uL (ref 0.00–0.07)
Basophils Absolute: 0.1 K/uL (ref 0.0–0.1)
Basophils Relative: 1 %
Eosinophils Absolute: 0.7 K/uL — ABNORMAL HIGH (ref 0.0–0.5)
Eosinophils Relative: 6 %
HCT: 29.2 % — ABNORMAL LOW (ref 36.0–46.0)
Hemoglobin: 10.9 g/dL — ABNORMAL LOW (ref 12.0–15.0)
Immature Granulocytes: 0 %
Lymphocytes Relative: 33 %
Lymphs Abs: 3.5 K/uL (ref 0.7–4.0)
MCH: 32.3 pg (ref 26.0–34.0)
MCHC: 37.3 g/dL — ABNORMAL HIGH (ref 30.0–36.0)
MCV: 86.6 fL (ref 80.0–100.0)
Monocytes Absolute: 0.7 K/uL (ref 0.1–1.0)
Monocytes Relative: 6 %
Neutro Abs: 5.7 K/uL (ref 1.7–7.7)
Neutrophils Relative %: 54 %
Platelets: 507 K/uL — ABNORMAL HIGH (ref 150–400)
RBC: 3.37 MIL/uL — ABNORMAL LOW (ref 3.87–5.11)
RDW: 13.9 % (ref 11.5–15.5)
WBC: 10.6 K/uL — ABNORMAL HIGH (ref 4.0–10.5)
nRBC: 0.3 % — ABNORMAL HIGH (ref 0.0–0.2)

## 2020-06-04 LAB — COMPREHENSIVE METABOLIC PANEL
ALT: 11 U/L (ref 0–44)
AST: 16 U/L (ref 15–41)
Albumin: 4.2 g/dL (ref 3.5–5.0)
Alkaline Phosphatase: 37 U/L — ABNORMAL LOW (ref 38–126)
Anion gap: 9 (ref 5–15)
BUN: 12 mg/dL (ref 6–20)
CO2: 24 mmol/L (ref 22–32)
Calcium: 9 mg/dL (ref 8.9–10.3)
Chloride: 107 mmol/L (ref 98–111)
Creatinine, Ser: 0.74 mg/dL (ref 0.44–1.00)
GFR, Estimated: >60 mL/min (ref >60)
Glucose, Bld: 88 mg/dL (ref 70–99)
Potassium: 4.2 mmol/L (ref 3.5–5.1)
Sodium: 140 mmol/L (ref 135–145)
Total Bilirubin: 0.9 mg/dL (ref 0.3–1.2)
Total Protein: 7.2 g/dL (ref 6.5–8.1)

## 2020-06-04 MED ORDER — SODIUM CHLORIDE 0.45 % IV SOLN: INTRAVENOUS | Status: DC

## 2020-06-04 MED ORDER — HYDROMORPHONE 1 MG/ML IV SOLN
INTRAVENOUS | Status: DC
Start: 2020-06-04 — End: 2020-06-04
  Administered 2020-06-04: 30 mg via INTRAVENOUS
  Administered 2020-06-04: 9 mg via INTRAVENOUS
  Filled 2020-06-04: qty 30

## 2020-06-04 MED ORDER — KETOROLAC TROMETHAMINE 30 MG/ML IJ SOLN
15.0000 mg | Freq: Once | INTRAMUSCULAR | Status: AC
  Administered 2020-06-04: 15 mg via INTRAVENOUS
  Filled 2020-06-04: qty 1

## 2020-06-04 MED ORDER — ONDANSETRON HCL 4 MG/2ML IJ SOLN: 4.0000 mg | Freq: Four times a day (QID) | INTRAMUSCULAR | Status: DC | PRN

## 2020-06-04 MED ORDER — SODIUM CHLORIDE 0.9% FLUSH: 9.0000 mL | INTRAVENOUS | Status: DC | PRN

## 2020-06-04 MED ORDER — ACETAMINOPHEN 500 MG PO TABS
1000.0000 mg | ORAL_TABLET | Freq: Once | ORAL | Status: AC
  Administered 2020-06-04: 1000 mg via ORAL
  Filled 2020-06-04: qty 2

## 2020-06-04 MED ORDER — NALOXONE HCL 0.4 MG/ML IJ SOLN: 0.4000 mg | INTRAMUSCULAR | Status: DC | PRN

## 2020-06-04 NOTE — Progress Notes (Signed)
Patient admitted [facility] the day infusion hospital for sickle cell pain crisis. Initially, patient reported left shoulder and upper arm pain rated 5/10. For pain management, patient placed on Dilaudid PCA, given Tylenol, Toradol  and hydrated with IV fluids. At discharge, patient rated pain at 2/10. Vital signs stable. AVS offered but patient refused. Patient alert, oriented and ambulatory at discharge.

## 2020-06-04 NOTE — H&P (Signed)
[facility] History and Physical   Date: 06/04/2020  Patient name: Lindsey [name] Medical record number: [id number] Date of birth: 1970-08-29 Age: 50 y.o. Gender: female PCP: Massie Maroon, FNP  Attending physician: Quentin Angst, MD  Chief Complaint: Sickle cell pain  History of Present Illness: Lindsey [name] a 50 year old female with a medical history significant for sickle cell disease, chronic pain syndrome, opiate dependence and tolerance that presents with complaints left arm pain that is consistent with previous sickle cell pain crisis.  Patient states that left arm pain has been increased over the past week and has been unrelieved by home medications.  Patient last had oxycodone this a.m. without sustained relief. Patient rates pain at 6/10 characterized as constant and throbbing.  Patient denies any injury.  Patient denies fever, chills, chest pain, urinary symptoms, nausea, vomiting, or diarrhea.  Meds: Medications Prior to Admission  Medication Sig Dispense Refill Last Dose  . acetaminophen (TYLENOL) 500 MG tablet Take 1,000 mg by mouth every 6 (six) hours as needed for moderate pain.     [name] albuterol (VENTOLIN HFA) 108 (90 Base) MCG/ACT inhaler INHALE 2 PUFFS INTO THE LUNGS EVERY 6 HOURS AS NEEDED FOR WHEEZING OR SHORTNESS OF BREATH 18 g 0   . Aspirin-Acetaminophen-Caffeine (GOODY HEADACHE PO) Take 1 packet by mouth every 8 (eight) hours as needed (pain/headache).      [name] azithromycin (ZITHROMAX) 250 MG tablet Take 500 mg today. 250 mg on days 2-5. 6 tablet 0   . ergocalciferol (VITAMIN D2) 1.25 MG (50000 UT) capsule Take 1 capsule (50,000 Units total) by mouth once a week. 12 capsule 1   . gabapentin (NEURONTIN) 300 MG capsule Take 1 capsule (300 mg total) by mouth 3 (three) times daily. 90 capsule 1   . ibuprofen (ADVIL) 800 MG tablet TAKE 1 TABLET(800 MG) BY MOUTH EVERY 8 HOURS AS NEEDED (Patient taking differently: Take 800 mg by mouth every 8 (eight)  hours as needed for moderate pain.) 60 tablet 5   . Melatonin 10 MG TABS Take 10 mg by mouth at bedtime.     [name] oxyCODONE (OXYCONTIN) 10 mg 12 hr tablet Take 1 tablet (10 mg total) by mouth every 12 (twelve) hours. 60 tablet 0   . Oxycodone HCl 10 MG TABS Take 1 tablet (10 mg total) by mouth every 6 (six) hours as needed for up to 15 days (pain). 60 tablet 0     Allergies: Patient has no known allergies. Past Medical History:  Diagnosis Date  . 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 Opioid dependence in remission (HCC)   . Sickle cell crisis [facility])    Past Surgical History:  Procedure Laterality Date  . APPENDECTOMY    . CESAREAN SECTION    . OTHER SURGICAL HISTORY     c-section   Family History  Problem Relation Age of Onset  . Stroke Neg Hx        none that she knows of   . Seizures Neg Hx    Social History   Socioeconomic History  . Marital status: Married    Spouse name: Not on file  . Number of children: 2  . Years of education: Not on file  . Highest education level: Not on file  Occupational History  . Not on file  Tobacco Use  . Smoking status: Current Every Day Smoker    Packs/day: 0.00    Types: Cigars  . Smokeless tobacco: Never Used  Vaping Use  .  Vaping Use: Former  Substance and Sexual Activity  . Alcohol use: Yes    Comment: occasionally  . Drug use: No  . Sexual activity: Yes    Birth control/protection: None  Other Topics Concern  . Not on file  Social History Narrative   Lives at home with husband and son   Right handed   Social Determinants of Health   Financial Resource Strain: Not on file  Food Insecurity: Not on file  Transportation Needs: Not on file  Physical Activity: Not on file  Stress: Not on file  Social Connections: Not on file  Intimate Partner Violence: Not on file  Review of Systems  Constitutional: Negative for chills and fever.  HENT: Negative.   Eyes: Negative.   Respiratory: Negative.   Cardiovascular:  Negative.   Genitourinary: Negative.   Musculoskeletal: Positive for back pain and joint pain.  Skin: Negative.   Neurological: Negative.   Psychiatric/Behavioral: Negative.     Physical Exam: There were no vitals taken for this visit.  Physical Exam Constitutional:      Appearance: Normal appearance.  Eyes:     Pupils: Pupils are equal, round, and reactive to light.  Cardiovascular:     Rate and Rhythm: Normal rate and regular rhythm.     Pulses: Normal pulses.  Pulmonary:     Effort: Pulmonary effort is normal.  Abdominal:     General: Abdomen is flat. Bowel sounds are normal.  Musculoskeletal:        General: Normal range of motion.  Neurological:     General: No focal deficit present.     Mental Status: She is alert. Mental status is at baseline.  Psychiatric:        Mood and Affect: Mood normal.        Behavior: Behavior normal.        Thought Content: Thought content normal.        Judgment: Judgment normal.    Lab results: No results found for this or any previous visit (from the past 24 hour(s)).  Imaging results:  No results found.   Assessment & Plan: Patient admitted to sickle cell day infusion center for management of pain crisis.  Patient is opiate naive Initiate IV dilaudid PCA. Settings of 0.5 mg, 10 minute lockout, and 3 mg/hr IV fluids, 0.45% saline with 100 ml/hr Toradol 15 mg IV times one dose Tylenol 1000 mg by mouth times one dose Review CBC with differential, complete metabolic panel, and reticulocytes as results become available. Baseline hemoglobin is 9-10 Pain intensity will be reevaluated in context of functioning and relationship to baseline as care progresses If pain intensity remains elevated and/or sudden change in hemodynamic stability transition to inpatient services for higher level of care.    [name]  APRN, MSN, FNP-C Patient Care [facility] [address] [place], [place]  [address] [phone]  06/04/2020, 9:05 AM

## 2020-06-04 NOTE — Telephone Encounter (Signed)
Patient called in. Complains of pain in L arm and shoulder that started 2 days ago rates 5/10. Denied chest pain, abd pain, fever, N/V/D. Wants to come in for treatment. Last took Oxycodone 10mg  at 3 am. Denies recent visits to ED. Denies exposure to anyone who is covid positive in last 2 weeks, had covid test done around 2 weeks ago, was not symptomatic but was offered to be tested through the [facility], denies any flu like symptoms today. Husband is patient's transport.  FNP notified, approved for patient to be seen in day hospital today. Pt made aware, verbalizes understanding.

## 2020-06-04 NOTE — Discharge Summary (Signed)
[facility] Discharge Summary   Patient ID: Lindsey [name] MRN: [id number] DOB/AGE: 01-16-1971 50 y.o.  Admit date: 06/04/2020 Discharge date: 06/04/2020  Primary Care Physician:  Massie Maroon, FNP  Admission Diagnoses:  Active Problems:   Sickle cell pain crisis [facility])   Discharge Medications:  Allergies as of 06/04/2020   No Known Allergies     Medication List    TAKE these medications   acetaminophen 500 MG tablet Commonly known as: TYLENOL Take 1,000 mg by mouth every 6 (six) hours as needed for moderate pain.   albuterol 108 (90 Base) MCG/ACT inhaler Commonly known as: VENTOLIN HFA INHALE 2 PUFFS INTO THE LUNGS EVERY 6 HOURS AS NEEDED FOR WHEEZING OR SHORTNESS OF BREATH   azithromycin 250 MG tablet Commonly known as: ZITHROMAX Take 500 mg today. 250 mg on days 2-5.   ergocalciferol 1.25 MG (50000 UT) capsule Commonly known as: VITAMIN D2 Take 1 capsule (50,000 Units total) by mouth once a week.   gabapentin 300 MG capsule Commonly known as: NEURONTIN Take 1 capsule (300 mg total) by mouth 3 (three) times daily.   GOODY HEADACHE PO Take 1 packet by mouth every 8 (eight) hours as needed (pain/headache).   ibuprofen 800 MG tablet Commonly known as: ADVIL TAKE 1 TABLET(800 MG) BY MOUTH EVERY 8 HOURS AS NEEDED What changed: See the new instructions.   Melatonin 10 MG Tabs Take 10 mg by mouth at bedtime.   oxyCODONE 10 mg 12 hr tablet Commonly known as: OXYCONTIN Take 1 tablet (10 mg total) by mouth every 12 (twelve) hours.   Oxycodone HCl 10 MG Tabs Take 1 tablet (10 mg total) by mouth every 6 (six) hours as needed for up to 15 days (pain).        Consults:  None  Significant Diagnostic Studies:  No results found.  History of present illness:  Shaunda Davisis a 50 year old female with a medical history significant for sickle cell disease, chronic pain syndrome, opiate dependence and tolerance that presents with complaints left  arm pain that is consistent with previous sickle cell pain crisis.  Patient attributes pain crisis to changes in weather.  Patient states that left arm pain has been increased over the past week and has been unrelieved by home medications.  Patient last had oxycodone this a.m. without sustained relief. Patient rates pain at 6/10 characterized as constant and throbbing.  Patient denies any injury.  Patient denies fever, chills, chest pain, urinary symptoms, nausea, vomiting, or diarrhea.   [facility] Course: Patient admitted to sickle cell day infusion center for management of pain crisis Reviewed all laboratory values, largely consistent with patient's baseline Pain managed with IV Dilaudid PCA IV Toradol 15 mg IV Tylenol 1000 mg IV fluid, 0.45% saline at 100 mL/h. Pain intensity decreased to 3/10.  Inpatient admission not warranted.  Patient alert, oriented, and ambulating without assistance.  Will discharge home in a hemodynamically stable condition.  Discharge instructions: Resume all home medications.   Follow up with PCP as previously  scheduled.   Discussed the importance of drinking 64 ounces of water daily, dehydration of red blood cells may lead further sickling.   Avoid all stressors that precipitate sickle cell pain crisis.     The patient was given clear instructions to go to ER or return to medical center if symptoms do not improve, worsen or new problems develop.    Physical Exam at Discharge:  BP 108/67 (BP Location: Left Arm)  Pulse 64   Temp 97.7 F (36.5 C) (Temporal)   Resp 11   LMP 06/02/2020   SpO2 96%   Physical Exam Constitutional:      Appearance: Normal appearance.  Eyes:     Pupils: Pupils are equal, round, and reactive to light.  Cardiovascular:     Rate and Rhythm: Normal rate and regular rhythm.     Pulses: Normal pulses.  Pulmonary:     Effort: Pulmonary effort is normal.  Abdominal:     General: Abdomen is flat. Bowel  sounds are normal.  Musculoskeletal:        General: Normal range of motion.  Skin:    General: Skin is warm.  Neurological:     General: No focal deficit present.     Mental Status: She is alert. Mental status is at baseline.  Psychiatric:        Mood and Affect: Mood normal.        Thought Content: Thought content normal.        Judgment: Judgment normal.     Disposition at Discharge: Discharge disposition: 01-Home or Self Care       Discharge Orders: Discharge Instructions    Discharge patient   Complete by: As directed    Discharge disposition: 01-Home or Self Care   Discharge patient date: 06/04/2020      Condition at Discharge:   Stable  Time spent on Discharge:  Greater than 30 minutes.  Signed: [name]  APRN, MSN, FNP-C Patient Care [facility] [address] [place], [place] [address] [phone]  06/04/2020, 2:57 PM

## 2020-06-11 ENCOUNTER — Other Ambulatory Visit: Payer: Self-pay | Admitting: Family Medicine

## 2020-06-11 ENCOUNTER — Telehealth: Payer: Self-pay | Admitting: Family Medicine

## 2020-06-11 ENCOUNTER — Other Ambulatory Visit: Payer: Self-pay

## 2020-06-11 DIAGNOSIS — G894 Chronic pain syndrome: Secondary | ICD-10-CM

## 2020-06-11 MED ORDER — OXYCODONE HCL 10 MG PO TABS: 10.0000 mg | ORAL_TABLET | Freq: Four times a day (QID) | ORAL | 0 refills | Status: DC | PRN

## 2020-06-11 NOTE — Progress Notes (Signed)
Reviewed PDMP substance reporting system prior to prescribing opiate medications. No inconsistencies noted.  Meds ordered this encounter  Medications   Oxycodone HCl 10 MG TABS    Sig: Take 1 tablet (10 mg total) by mouth every 6 (six) hours as needed for up to 15 days (pain).    Dispense:  60 tablet    Refill:  0    Order Specific Question:   Supervising Provider    Answer:   [name], [name]   Kanon Novosel Moore Xylah Early  APRN, MSN, FNP-C Patient Care [facility] [address]  [place], [place] [address] [phone]  

## 2020-06-11 NOTE — Telephone Encounter (Signed)
done

## 2020-06-13 ENCOUNTER — Other Ambulatory Visit: Payer: Self-pay | Admitting: Family Medicine

## 2020-06-13 DIAGNOSIS — G894 Chronic pain syndrome: Secondary | ICD-10-CM

## 2020-06-13 MED ORDER — OXYCODONE HCL 10 MG PO TABS: 10.0000 mg | ORAL_TABLET | Freq: Four times a day (QID) | ORAL | 0 refills | Status: DC | PRN

## 2020-06-24 ENCOUNTER — Telehealth: Payer: Self-pay | Admitting: Family Medicine

## 2020-06-24 NOTE — Telephone Encounter (Signed)
Refill:  Oxycodone 10 mg Per pt request

## 2020-06-25 ENCOUNTER — Other Ambulatory Visit: Payer: Self-pay | Admitting: Family Medicine

## 2020-06-26 ENCOUNTER — Encounter (HOSPITAL_COMMUNITY): Payer: Self-pay | Admitting: Emergency Medicine

## 2020-06-26 ENCOUNTER — Other Ambulatory Visit: Payer: Self-pay

## 2020-06-26 ENCOUNTER — Inpatient Hospital Stay (HOSPITAL_COMMUNITY)
Admission: EM | Admit: 2020-06-26 | Discharge: 2020-06-29 | DRG: 812 | Disposition: A | Discharge status: Home / Self Care | Payer: Medicaid Other | Attending: Internal Medicine | Admitting: Internal Medicine

## 2020-06-26 ENCOUNTER — Other Ambulatory Visit: Payer: Self-pay | Admitting: Family Medicine

## 2020-06-26 DIAGNOSIS — G894 Chronic pain syndrome: Secondary | ICD-10-CM | POA: Diagnosis present

## 2020-06-26 DIAGNOSIS — M25512 Pain in left shoulder: Secondary | ICD-10-CM | POA: Diagnosis present

## 2020-06-26 DIAGNOSIS — E876 Hypokalemia: Secondary | ICD-10-CM

## 2020-06-26 DIAGNOSIS — F17290 Nicotine dependence, other tobacco product, uncomplicated: Secondary | ICD-10-CM | POA: Diagnosis present

## 2020-06-26 DIAGNOSIS — Z20822 Contact with and (suspected) exposure to covid-19: Secondary | ICD-10-CM | POA: Diagnosis present

## 2020-06-26 DIAGNOSIS — D5700 Hb-SS disease with crisis, unspecified: Principal | ICD-10-CM | POA: Diagnosis present

## 2020-06-26 DIAGNOSIS — D72829 Elevated white blood cell count, unspecified: Secondary | ICD-10-CM | POA: Diagnosis present

## 2020-06-26 DIAGNOSIS — M79605 Pain in left leg: Secondary | ICD-10-CM | POA: Diagnosis present

## 2020-06-26 DIAGNOSIS — Z79899 Other long term (current) drug therapy: Secondary | ICD-10-CM

## 2020-06-26 DIAGNOSIS — R059 Cough, unspecified: Secondary | ICD-10-CM

## 2020-06-26 DIAGNOSIS — Z9049 Acquired absence of other specified parts of digestive tract: Secondary | ICD-10-CM

## 2020-06-26 LAB — CBC WITH DIFFERENTIAL/PLATELET
Abs Immature Granulocytes: 0.03 10*3/uL (ref 0.00–0.07)
Basophils Absolute: 0.1 10*3/uL (ref 0.0–0.1)
Basophils Relative: 1 %
Eosinophils Absolute: 0.4 10*3/uL (ref 0.0–0.5)
Eosinophils Relative: 4 %
HCT: 29.9 % — ABNORMAL LOW (ref 36.0–46.0)
Hemoglobin: 11.2 g/dL — ABNORMAL LOW (ref 12.0–15.0)
Immature Granulocytes: 0 %
Lymphocytes Relative: 39 %
Lymphs Abs: 4.8 10*3/uL — ABNORMAL HIGH (ref 0.7–4.0)
MCH: 32.7 pg (ref 26.0–34.0)
MCHC: 37.5 g/dL — ABNORMAL HIGH (ref 30.0–36.0)
MCV: 87.4 fL (ref 80.0–100.0)
Monocytes Absolute: 0.7 10*3/uL (ref 0.1–1.0)
Monocytes Relative: 6 %
Neutro Abs: 6.2 10*3/uL (ref 1.7–7.7)
Neutrophils Relative %: 50 %
Platelets: 421 10*3/uL — ABNORMAL HIGH (ref 150–400)
RBC: 3.42 MIL/uL — ABNORMAL LOW (ref 3.87–5.11)
RDW: 14.1 % (ref 11.5–15.5)
WBC: 12.2 10*3/uL — ABNORMAL HIGH (ref 4.0–10.5)
nRBC: 0.2 % (ref 0.0–0.2)

## 2020-06-26 LAB — COMPREHENSIVE METABOLIC PANEL
ALT: 13 U/L (ref 0–44)
AST: 17 U/L (ref 15–41)
Albumin: 4.6 g/dL (ref 3.5–5.0)
Alkaline Phosphatase: 36 U/L — ABNORMAL LOW (ref 38–126)
Anion gap: 9 (ref 5–15)
BUN: 7 mg/dL (ref 6–20)
CO2: 22 mmol/L (ref 22–32)
Calcium: 9.1 mg/dL (ref 8.9–10.3)
Chloride: 110 mmol/L (ref 98–111)
Creatinine, Ser: 1.02 mg/dL — ABNORMAL HIGH (ref 0.44–1.00)
GFR, Estimated: >60 mL/min (ref >60)
Glucose, Bld: 111 mg/dL — ABNORMAL HIGH (ref 70–99)
Potassium: 3.4 mmol/L — ABNORMAL LOW (ref 3.5–5.1)
Sodium: 141 mmol/L (ref 135–145)
Total Bilirubin: 1.6 mg/dL — ABNORMAL HIGH (ref 0.3–1.2)
Total Protein: 7.7 g/dL (ref 6.5–8.1)

## 2020-06-26 LAB — RETICULOCYTES
Immature Retic Fract: 21.7 % — ABNORMAL HIGH (ref 2.3–15.9)
RBC.: 3.34 MIL/uL — ABNORMAL LOW (ref 3.87–5.11)
Retic Count, Absolute: 110.9 10*3/uL (ref 19.0–186.0)
Retic Ct Pct: 3.3 % — ABNORMAL HIGH (ref 0.4–3.1)

## 2020-06-26 LAB — I-STAT BETA HCG BLOOD, ED (MC, WL, AP ONLY): I-stat hCG, quantitative: <5 m[IU]/mL (ref <5)

## 2020-06-26 MED ORDER — HYDROMORPHONE HCL 2 MG/ML IJ SOLN
2.0000 mg | Freq: Once | INTRAMUSCULAR | Status: AC
  Administered 2020-06-26: 2 mg via INTRAVENOUS
  Filled 2020-06-26: qty 1

## 2020-06-26 MED ORDER — HYDROMORPHONE HCL 2 MG/ML IJ SOLN
2.0000 mg | INTRAMUSCULAR | Status: AC
  Administered 2020-06-26: 2 mg via INTRAVENOUS
  Filled 2020-06-26: qty 1

## 2020-06-26 MED ORDER — SODIUM CHLORIDE 0.9 % IV BOLUS
500.0000 mL | Freq: Once | INTRAVENOUS | Status: AC
  Administered 2020-06-26: 500 mL via INTRAVENOUS

## 2020-06-26 MED ORDER — PROMETHAZINE HCL 25 MG PO TABS
25.0000 mg | ORAL_TABLET | ORAL | Status: DC | PRN
  Administered 2020-06-26: 25 mg via ORAL
  Filled 2020-06-26: qty 1

## 2020-06-26 NOTE — Progress Notes (Signed)
Reviewed PDMP, multiple inconsistencies noted.  No opiate medications will be prescribed at this time.  Will notify patient's pharmacy and will contact patient when inconsistencies are resolved.  [name]  APRN, MSN, FNP-C Patient Care [facility] [address] [place], [place] [address] [phone]

## 2020-06-26 NOTE — ED Provider Notes (Signed)
[facility]-EMERGENCY DEPT Provider Note   CSN: [id number] Arrival date & time: 06/26/20  2106     History Chief Complaint  Patient presents with  . Sickle Cell Pain Crisis    Lindsey [name] is a 50 y.o. female history of sickle cell disease.  Patient presents today for sickle cell pain crisis onset around 1 PM today she reports severe aching pain of her legs constant nonradiating no clear aggravating or alleviating factors she attempted her home narcotic medication without improvement.  She reports this feels exactly similar to previous episodes of sickle cell pain crisis and has no abnormal features.    Patient denies fever/chills, headache, chest pain/shortness of breath, cough, abdominal pain, vomiting, diarrhea, numbness/weakness, tingling, extremity swelling/color change, fall/injury or any additional concerns  HPI     Past Medical History:  Diagnosis Date  . 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 Opioid dependence in remission (HCC)   . Sickle cell crisis [facility])     Patient Active Problem List   Diagnosis Date Noted  . Acute pain of left shoulder   . Right leg pain   . Sickle cell anemia with crisis (HCC) 09/10/2019  . Anemia of chronic disease 09/10/2019  . Current mild episode of major depressive disorder (HCC)   . Sickle cell crisis (HCC) 02/28/2019  . Opioid dependence in remission (HCC) 10/01/2017  . Sickle cell disease (HCC) 10/01/2017  . Chronic pain 10/01/2017  . 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 Leukocytosis 09/03/2016  . Sickle cell pain crisis (HCC) 07/20/2012  . 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 APPENDECTOMY    . CESAREAN SECTION    . OTHER SURGICAL HISTORY     c-section     OB History   No obstetric history on file.     Family History  Problem Relation Age of Onset  . Stroke Neg Hx        none that she knows of   . Seizures Neg Hx      Social History   Tobacco Use  . Smoking status: Current Every Day Smoker    Packs/day: 0.00    Types: Cigars  . Smokeless tobacco: Never Used  Vaping Use  . Vaping Use: Former  Substance Use Topics  . Alcohol use: Yes    Comment: occasionally  . Drug use: No    Home Medications Prior to Admission medications   Medication Sig Start Date End Date Taking? Authorizing Provider  acetaminophen (TYLENOL) 500 MG tablet Take 1,000 mg by mouth every 6 (six) hours as needed for moderate pain.    [provider]  albuterol (VENTOLIN HFA) 108 (90 Base) MCG/ACT inhaler INHALE 2 PUFFS INTO THE LUNGS EVERY 6 HOURS AS NEEDED FOR WHEEZING OR SHORTNESS OF BREATH 06/01/20   Massie Maroon, FNP  Aspirin-Acetaminophen-Caffeine (GOODY HEADACHE PO) Take 1 packet by mouth every 8 (eight) hours as needed (pain/headache).     [provider]  azithromycin (ZITHROMAX) 250 MG tablet Take 500 mg today. 250 mg on days 2-5. 05/19/20   Massie Maroon, FNP  ergocalciferol (VITAMIN D2) 1.25 MG (50000 UT) capsule Take 1 capsule (50,000 Units total) by mouth once a week. 10/16/19   Massie Maroon, FNP  gabapentin (NEURONTIN) 300 MG capsule Take 1 capsule (300 mg total) by mouth 3 (three) times daily. 03/11/20   Massie Maroon, FNP  ibuprofen (ADVIL) 800 MG tablet  TAKE 1 TABLET(800 MG) BY MOUTH EVERY 8 HOURS AS NEEDED Patient taking differently: Take 800 mg by mouth every 8 (eight) hours as needed for moderate pain. 10/14/19   Massie Maroon, FNP  Melatonin 10 MG TABS Take 10 mg by mouth at bedtime.    [provider]  Oxycodone HCl 10 MG TABS Take 1 tablet (10 mg total) by mouth every 6 (six) hours as needed for up to 15 days (pain). 06/13/20 06/28/20  Kallie Locks, FNP    Allergies    Patient has no known allergies.  Review of Systems   Review of Systems Ten systems are reviewed and are negative for acute change except as noted in the HPI  Physical Exam Updated Vital  Signs BP (!) 151/90 (BP Location: Right Arm)   Pulse (!) 112   Temp 98.6 F (37 C) (Oral)   Resp (!) 24   Ht 5' (1.524 m)   Wt 63.5 kg   LMP 06/02/2020   SpO2 99%   BMI 27.34 kg/m   Physical Exam Constitutional:      General: She is not in acute distress.    Appearance: Normal appearance. She is well-developed. She is not ill-appearing or diaphoretic.  HENT:     Head: Normocephalic and atraumatic.  Eyes:     General: Vision grossly intact. Gaze aligned appropriately.     Pupils: Pupils are equal, round, and reactive to light.  Neck:     Trachea: Trachea and phonation normal.  Cardiovascular:     Rate and Rhythm: Normal rate and regular rhythm.  Pulmonary:     Effort: Pulmonary effort is normal. No respiratory distress.  Abdominal:     General: There is no distension.     Palpations: Abdomen is soft.     Tenderness: There is no abdominal tenderness. There is no guarding or rebound.  Musculoskeletal:        General: Normal range of motion.     Cervical back: Normal range of motion.  Skin:    General: Skin is warm and dry.  Neurological:     Mental Status: She is alert.     GCS: GCS eye subscore is 4. GCS verbal subscore is 5. GCS motor subscore is 6.     Comments: Speech is clear and goal oriented, follows commands Major Cranial nerves without deficit, no facial droop Moves extremities without ataxia, coordination intact  Psychiatric:        Behavior: Behavior normal.     ED Results / Procedures / Treatments   Labs (all labs ordered are listed, but only abnormal results are displayed) Labs Reviewed  COMPREHENSIVE METABOLIC PANEL  CBC WITH DIFFERENTIAL/PLATELET  RETICULOCYTES  I-STAT BETA HCG BLOOD, ED ([facility], [facility], [facility] ONLY)    EKG EKG Interpretation  Date/Time:  Friday June 26 2020 21:48:25 EST Ventricular Rate:  88 PR Interval:    QRS Duration: 92 QT Interval:  359 QTC Calculation: 435 R Axis:   77 Text Interpretation: Sinus rhythm Confirmed by Virgina Norfolk [phone]) on 06/26/2020 11:31:59 PM   Radiology No results found.  Procedures Procedures   Medications Ordered in ED Medications - No data to display  ED Course  I have reviewed the triage vital signs and the nursing notes.  Pertinent labs & imaging results that were available during my care of the patient were reviewed by me and considered in my medical decision making (see chart for details).    MDM Rules/Calculators/A&P  Additional history obtained from: 1. Nursing notes from this visit. 2. EMR. ------------- I ordered, reviewed and interpreted labs which include: CBC shows slight worsening leukocytosis at 12.2, baseline hemoglobin of 11.2. CMP shows slight elevation of creatinine at 1.02, no emergent electrolyte derangement, or gap.  No emergent elevation of LFTs. Pregnancy test negative. Reticulocyte site count similar to 3 weeks ago.  EKG: Sinus rhythm Confirmed by Virgina Norfolk [phone]) on 06/26/2020 11:31:59 PM  Covid test pending - Patient reassessed she is uncomfortable appearing reports continued leg pain due to her sickle cell.  She has received 2 doses of Dilaudid so far, I ordered a third dose.  She is also reporting some nausea which I have ordered Phenergan for.  I have ordered IV fluids for slight AKI as well as third dose of Dilaudid.  Given muscle pain and has slight AKI will obtain CK to rule out rhabdomyolysis.  She has no evidence of trauma and normal-appearing bilateral lower extremities without swelling.  No evidence for DVT, septic arthritis, cellulitis, neurovascular compromise, compartment syndrome or other emergent pathology requiring imaging at this time.  Care handoff given to Frederik Pear, PA-C at shift change, plan of care is to follow-up on CK and reassess patient after she received third dose of pain medication.  Final disposition per oncoming team.   Note: Portions of this report may have been transcribed using voice  recognition software. Every effort was made to ensure accuracy; however, inadvertent computerized transcription errors may still be present. Final Clinical Impression(s) / ED Diagnoses Final diagnoses:  None    Rx / DC Orders ED Discharge Orders    None       Elizabeth Palau 06/26/20 2357    Virgina Norfolk, DO 06/27/20 0003

## 2020-06-27 DIAGNOSIS — R059 Cough, unspecified: Secondary | ICD-10-CM

## 2020-06-27 DIAGNOSIS — E876 Hypokalemia: Secondary | ICD-10-CM

## 2020-06-27 DIAGNOSIS — D5700 Hb-SS disease with crisis, unspecified: Principal | ICD-10-CM

## 2020-06-27 HISTORY — DX: Cough, unspecified: R05.9

## 2020-06-27 HISTORY — DX: Hypokalemia: E87.6

## 2020-06-27 LAB — CBC
Hemoglobin: 9.2 g/dL — ABNORMAL LOW (ref 12.0–15.0)
Platelets: 329 10*3/uL (ref 150–400)
WBC: 11.3 10*3/uL — ABNORMAL HIGH (ref 4.0–10.5)
nRBC: 0.3 % — ABNORMAL HIGH (ref 0.0–0.2)

## 2020-06-27 LAB — D-DIMER, QUANTITATIVE: D-Dimer, Quant: 0.8 ug/mL-FEU — ABNORMAL HIGH (ref 0.00–0.50)

## 2020-06-27 LAB — RESP PANEL BY RT-PCR (FLU A&B, COVID) ARPGX2
Influenza A by PCR: NEGATIVE
Influenza B by PCR: NEGATIVE
SARS Coronavirus 2 by RT PCR: NEGATIVE

## 2020-06-27 LAB — MAGNESIUM: Magnesium: 1.9 mg/dL (ref 1.7–2.4)

## 2020-06-27 LAB — TROPONIN I (HIGH SENSITIVITY): Troponin I (High Sensitivity): <2 ng/L (ref <18)

## 2020-06-27 LAB — CK: Total CK: 47 U/L (ref 38–234)

## 2020-06-27 MED ORDER — DIPHENHYDRAMINE HCL 25 MG PO CAPS: 25.0000 mg | ORAL_CAPSULE | ORAL | Status: DC | PRN

## 2020-06-27 MED ORDER — POTASSIUM CHLORIDE CRYS ER 20 MEQ PO TBCR
40.0000 meq | EXTENDED_RELEASE_TABLET | Freq: Once | ORAL | Status: DC
  Filled 2020-06-27: qty 2

## 2020-06-27 MED ORDER — SODIUM CHLORIDE 0.9 % IV SOLN
25.0000 mg | INTRAVENOUS | Status: DC | PRN
  Filled 2020-06-27: qty 0.5

## 2020-06-27 MED ORDER — SENNOSIDES-DOCUSATE SODIUM 8.6-50 MG PO TABS
1.0000 | ORAL_TABLET | Freq: Two times a day (BID) | ORAL | Status: DC
  Administered 2020-06-27 – 2020-06-29 (×5): 1 via ORAL
  Filled 2020-06-27 (×5): qty 1

## 2020-06-27 MED ORDER — NALOXONE HCL 0.4 MG/ML IJ SOLN: 0.4000 mg | INTRAMUSCULAR | Status: DC | PRN

## 2020-06-27 MED ORDER — FENTANYL CITRATE (PF) 100 MCG/2ML IJ SOLN
50.0000 ug | Freq: Once | INTRAMUSCULAR | Status: AC
  Administered 2020-06-27: 50 ug via INTRAVENOUS
  Filled 2020-06-27: qty 2

## 2020-06-27 MED ORDER — HYDROMORPHONE HCL 2 MG/ML IJ SOLN
INTRAMUSCULAR | Status: AC
  Administered 2020-06-27: 2 mg
  Filled 2020-06-27: qty 1

## 2020-06-27 MED ORDER — ACETAMINOPHEN 325 MG PO TABS: 650.0000 mg | ORAL_TABLET | Freq: Four times a day (QID) | ORAL | Status: DC | PRN

## 2020-06-27 MED ORDER — SODIUM CHLORIDE 0.9% FLUSH: 9.0000 mL | INTRAVENOUS | Status: DC | PRN

## 2020-06-27 MED ORDER — HYDROMORPHONE HCL 2 MG/ML IJ SOLN
2.0000 mg | INTRAMUSCULAR | Status: DC | PRN
  Administered 2020-06-27 (×2): 2 mg via INTRAVENOUS
  Filled 2020-06-27 (×2): qty 1

## 2020-06-27 MED ORDER — FENTANYL CITRATE (PF) 100 MCG/2ML IJ SOLN
75.0000 ug | Freq: Once | INTRAMUSCULAR | Status: AC
Start: 2020-06-27 — End: 2020-06-27
  Administered 2020-06-27: 75 ug via INTRAVENOUS
  Filled 2020-06-27: qty 2

## 2020-06-27 MED ORDER — POLYETHYLENE GLYCOL 3350 17 G PO PACK: 17.0000 g | PACK | Freq: Every day | ORAL | Status: DC | PRN

## 2020-06-27 MED ORDER — DIPHENHYDRAMINE HCL 12.5 MG/5ML PO ELIX: 12.5000 mg | ORAL_SOLUTION | Freq: Four times a day (QID) | ORAL | Status: DC | PRN

## 2020-06-27 MED ORDER — HYDROMORPHONE HCL 2 MG/ML IJ SOLN: 2.0000 mg | Freq: Once | INTRAMUSCULAR | Status: DC

## 2020-06-27 MED ORDER — ENOXAPARIN SODIUM 40 MG/0.4ML ~~LOC~~ SOLN
40.0000 mg | SUBCUTANEOUS | Status: DC
  Administered 2020-06-27 – 2020-06-29 (×3): 40 mg via SUBCUTANEOUS
  Filled 2020-06-27 (×4): qty 0.4

## 2020-06-27 MED ORDER — HYDROMORPHONE HCL 2 MG/ML IJ SOLN: 2.0000 mg | INTRAMUSCULAR | Status: DC

## 2020-06-27 MED ORDER — BENZONATATE 100 MG PO CAPS
100.0000 mg | ORAL_CAPSULE | Freq: Three times a day (TID) | ORAL | Status: DC | PRN
  Administered 2020-06-27: 100 mg via ORAL
  Filled 2020-06-27: qty 1

## 2020-06-27 MED ORDER — ONDANSETRON HCL 4 MG/2ML IJ SOLN: 4.0000 mg | Freq: Four times a day (QID) | INTRAMUSCULAR | Status: DC | PRN

## 2020-06-27 MED ORDER — HYDROMORPHONE HCL 2 MG/ML IJ SOLN
2.0000 mg | Freq: Once | INTRAMUSCULAR | Status: DC
  Filled 2020-06-27: qty 1

## 2020-06-27 MED ORDER — DIPHENHYDRAMINE HCL 50 MG/ML IJ SOLN: 12.5000 mg | Freq: Four times a day (QID) | INTRAMUSCULAR | Status: DC | PRN

## 2020-06-27 MED ORDER — LIP MEDEX EX OINT
1.0000 "application " | TOPICAL_OINTMENT | CUTANEOUS | Status: DC | PRN
  Filled 2020-06-27: qty 7

## 2020-06-27 MED ORDER — HYDROMORPHONE 1 MG/ML IV SOLN
INTRAVENOUS | Status: DC
  Administered 2020-06-27: 4.5 mg via INTRAVENOUS
  Administered 2020-06-27: 30 mg via INTRAVENOUS
  Administered 2020-06-27: 4 mg via INTRAVENOUS
  Administered 2020-06-28: 2 mg via INTRAVENOUS
  Administered 2020-06-28: 4.5 mg via INTRAVENOUS
  Administered 2020-06-28: 3.5 mg via INTRAVENOUS
  Administered 2020-06-28: 30 mg via INTRAVENOUS
  Administered 2020-06-28: 6 mg via INTRAVENOUS
  Administered 2020-06-28: 5 mg via INTRAVENOUS
  Administered 2020-06-29: 4 mg via INTRAVENOUS
  Administered 2020-06-29: 3.5 mg via INTRAVENOUS
  Filled 2020-06-27: qty 30

## 2020-06-27 MED ORDER — KETOROLAC TROMETHAMINE 15 MG/ML IJ SOLN
15.0000 mg | Freq: Once | INTRAMUSCULAR | Status: AC
  Administered 2020-06-27: 15 mg via INTRAVENOUS
  Filled 2020-06-27: qty 1

## 2020-06-27 MED ORDER — SODIUM CHLORIDE 0.45 % IV SOLN: INTRAVENOUS | Status: DC

## 2020-06-27 MED ORDER — HYDROMORPHONE 1 MG/ML IV SOLN
INTRAVENOUS | Status: DC
  Filled 2020-06-27: qty 30

## 2020-06-27 NOTE — ED Notes (Signed)
RN called Dr Rush Landmark an received order for pain med for patient. Meds given per mar

## 2020-06-27 NOTE — ED Notes (Addendum)
Pt able to transfer self to bedside commode. Pt complains of pain in legs. Heating pads were provided and placed on leg. RN aware.

## 2020-06-27 NOTE — ED Notes (Signed)
Pt in bed resting, respirations even and unlabored. Family at bedside 

## 2020-06-27 NOTE — ED Notes (Signed)
Report given to Katlyn.

## 2020-06-27 NOTE — ED Notes (Addendum)
Pt is in extreme pain, RN has notified provider by chat two times. Waiting on orders at this time for pain meds. Respirations even and unlabored.

## 2020-06-27 NOTE — H&P (Signed)
History and Physical    Lindsey [name] ZTI:[id number] DOB: [birth year] DOA: 06/26/2020  PCP: Massie Maroon, FNP Patient coming from: Home  Chief Complaint: "sickle cell pain"  HPI: Lindsey [name] is a 50 y.o. female with medical history significant of sickle cell disease, chronic pain syndrome, opiate dependence and tolerance presenting with a chief complaint of sickle cell pain.  Patient reports 1 day history of severe left leg pain which she feels is similar to her prior sickle cell crisis episodes.  Denies any pain in her right leg.  Denies history of blood clots.  States since she has been emergency room, she has started experiencing slight left shoulder/arm pain which she feels is similar to the pain she has experienced during her prior sickle cell crisis episodes.  Denies any chest pain.  She ran out of her home oxycodone.  Reports dry cough for the past 3 weeks for which she states she had a video visit with her PCP and was diagnosed with bronchitis.  Patient states she was prescribed an inhaler and another medication but she has not been taking them.  Denies shortness of breath.  She is not vaccinated against COVID.  Per chart, patient had called her PCP yesterday for pain medication refill but this was not provided due to inconsistencies when PDMP was reviewed.  ED Course: Slightly tachycardic on arrival, heart rate improved after fluid bolus.  Remainder of vital signs stable.  WBC 12.2, hemoglobin 11.2 (at baseline), platelet count 421K.  Sodium 141, potassium 3.4, chloride 110, bicarb 22, BUN 7, creatinine 1.0 (baseline 0.7-0.8), glucose 111.  Beta hCG negative. SARS-CoV-2 PCR test negative.  Influenza panel negative.  CK normal. Patient was given multiple doses of Dilaudid.  Also given Phenergan and 500 cc normal saline bolus.  Review of Systems:  All systems reviewed and apart from history of presenting illness, are negative.  Past Medical History:  Diagnosis Date  . Opiate abuse,  episodic (HCC) 09/25/2017  . Opioid dependence in remission (HCC)   . Sickle cell crisis [facility])     Past Surgical History:  Procedure Laterality Date  . APPENDECTOMY    . CESAREAN SECTION    . OTHER SURGICAL HISTORY     c-section     reports that she has been smoking cigars. She has been smoking about 0.00 packs per day. She has never used smokeless tobacco. She reports current alcohol use. She reports that she does not use drugs.  No Known Allergies  Family History  Problem Relation Age of Onset  . Stroke Neg Hx        none that she knows of   . Seizures Neg Hx     Prior to Admission medications   Medication Sig Start Date End Date Taking? Authorizing Provider  acetaminophen (TYLENOL) 500 MG tablet Take 1,000 mg by mouth every 6 (six) hours as needed for moderate pain.    [provider]  albuterol (VENTOLIN HFA) 108 (90 Base) MCG/ACT inhaler INHALE 2 PUFFS INTO THE LUNGS EVERY 6 HOURS AS NEEDED FOR WHEEZING OR SHORTNESS OF BREATH 06/01/20   Massie Maroon, FNP  Aspirin-Acetaminophen-Caffeine (GOODY HEADACHE PO) Take 1 packet by mouth every 8 (eight) hours as needed (pain/headache).     [provider]  azithromycin (ZITHROMAX) 250 MG tablet Take 500 mg today. 250 mg on days 2-5. 05/19/20   Massie Maroon, FNP  ergocalciferol (VITAMIN D2) 1.25 MG (50000 UT) capsule Take 1 capsule (50,000 Units total) by mouth once  a week. 10/16/19   Massie Maroon, FNP  gabapentin (NEURONTIN) 300 MG capsule Take 1 capsule (300 mg total) by mouth 3 (three) times daily. 03/11/20   Massie Maroon, FNP  ibuprofen (ADVIL) 800 MG tablet TAKE 1 TABLET(800 MG) BY MOUTH EVERY 8 HOURS AS NEEDED Patient taking differently: Take 800 mg by mouth every 8 (eight) hours as needed for moderate pain. 10/14/19   Massie Maroon, FNP  Melatonin 10 MG TABS Take 10 mg by mouth at bedtime.    [provider]  Oxycodone HCl 10 MG TABS Take 1 tablet (10 mg total) by mouth every 6 (six)  hours as needed for up to 15 days (pain). 06/13/20 06/28/20  Kallie Locks, FNP    Physical Exam: Vitals:   06/27/20 0000 06/27/20 0030 06/27/20 0045 06/27/20 0119  BP: 119/72 113/64  120/74  Pulse: 82 86 87 86  Resp: (!) 22 17 14 20   Temp:      TempSrc:      SpO2: 97% 95% 96% 95%  Weight:      Height:        Physical Exam Constitutional:      Appearance: She is not toxic-appearing.  HENT:     Head: Normocephalic and atraumatic.  Eyes:     Extraocular Movements: Extraocular movements intact.     Conjunctiva/sclera: Conjunctivae normal.  Cardiovascular:     Rate and Rhythm: Normal rate and regular rhythm.     Pulses: Normal pulses.  Pulmonary:     Effort: Pulmonary effort is normal. No respiratory distress.     Breath sounds: Normal breath sounds. No wheezing or rales.  Abdominal:     General: Bowel sounds are normal. There is no distension.     Palpations: Abdomen is soft.     Tenderness: There is no abdominal tenderness.  Musculoskeletal:        General: No swelling or tenderness.     Cervical back: Normal range of motion and neck supple.  Skin:    General: Skin is warm and dry.  Neurological:     General: No focal deficit present.     Mental Status: She is alert and oriented to person, place, and time.     Labs on Admission: I have personally reviewed following labs and imaging studies  CBC: Recent Labs  Lab 06/26/20 2130  WBC 12.2*  NEUTROABS 6.2  HGB 11.2*  HCT 29.9*  MCV 87.4  PLT 421*   Basic Metabolic Panel: Recent Labs  Lab 06/26/20 2130  NA 141  K 3.4*  CL 110  CO2 22  GLUCOSE 111*  BUN 7  CREATININE 1.02*  CALCIUM 9.1   GFR: Estimated Creatinine Clearance: 56.1 mL/min (A) (by C-G formula based on SCr of 1.02 mg/dL (H)). Liver Function Tests: Recent Labs  Lab 06/26/20 2130  AST 17  ALT 13  ALKPHOS 36*  BILITOT 1.6*  PROT 7.7  ALBUMIN 4.6   No results for input(s): LIPASE, AMYLASE in the last 168 hours. No results for  input(s): AMMONIA in the last 168 hours. Coagulation Profile: No results for input(s): INR, PROTIME in the last 168 hours. Cardiac Enzymes: Recent Labs  Lab 06/26/20 2334  CKTOTAL 47   BNP (last 3 results) No results for input(s): PROBNP in the last 8760 hours. HbA1C: No results for input(s): HGBA1C in the last 72 hours. CBG: No results for input(s): GLUCAP in the last 168 hours. Lipid Profile: No results for input(s): CHOL, HDL, LDLCALC,  TRIG, CHOLHDL, LDLDIRECT in the last 72 hours. Thyroid Function Tests: No results for input(s): TSH, T4TOTAL, FREET4, T3FREE, THYROIDAB in the last 72 hours. Anemia Panel: Recent Labs    06/26/20 2130  RETICCTPCT 3.3*   Urine analysis:    Component Value Date/Time   COLORURINE YELLOW 10/24/2019 0230   APPEARANCEUR CLEAR 10/24/2019 0230   LABSPEC 1.016 10/24/2019 0230   PHURINE 5.0 10/24/2019 0230   GLUCOSEU NEGATIVE 10/24/2019 0230   HGBUR NEGATIVE 10/24/2019 0230   BILIRUBINUR neg 04/07/2020 1014   KETONESUR NEGATIVE 10/24/2019 0230   PROTEINUR Negative 04/07/2020 1014   PROTEINUR NEGATIVE 10/24/2019 0230   UROBILINOGEN 0.2 04/07/2020 1014   UROBILINOGEN 1.0 12/25/2012 1755   NITRITE neg 04/07/2020 1014   NITRITE NEGATIVE 10/24/2019 0230   LEUKOCYTESUR Small (1+) (A) 04/07/2020 1014   LEUKOCYTESUR TRACE (A) 10/24/2019 0230    Radiological Exams on Admission: No results found.  EKG: Independently reviewed.  Sinus rhythm.  Assessment/Plan Principal Problem:   Sickle cell pain crisis (HCC) Active Problems:   Leukocytosis   Cough   Hypokalemia   Sickle cell pain crisis: Patient complaining of left lower extremity pain and left shoulder/arm pain.  Denies chest pain and EKG without acute ischemic changes.  No clinical signs of DVT on exam.  No signs of neurovascular compromise.  CK normal.  Patient feels her symptoms are similar to prior sickle cell pain crisis episodes. -IV Dilaudid PCA.  Tylenol as needed.  IV Toradol 15 mg x  1.  0.45% saline at 100 cc/h.  Check high-sensitivity troponin level. Check D-dimer level given unilateral lower extremity pain, if elevated, order Doppler to rule out DVT.  Sickle cell anemia: Hemoglobin at baseline. -No indication for blood transfusion at this time, continue to monitor H&H  Cough: Patient reports having a dry cough for the past 3 weeks.  Not endorsing any symptoms of GERD.  Not on any ACE inhibitor.  Lungs clear on exam.  Satting 100% on room air.  SARS-CoV-2 PCR test negative. -Antitussive as needed  Mild leukocytosis: Likely reactive. -Continue to monitor  Mild hypokalemia -Monitor potassium and magnesium levels, replenish as needed  Pharmacy med rec pending.  DVT prophylaxis: Lovenox Code Status: Full code Family Communication: No family available at this time.  Treatment plan discussed with the patient in detail. Disposition Plan: Status is: Observation  The patient remains OBS appropriate and will d/c before 2 midnights.  Dispo: The patient is from: Home              Anticipated d/c is to: Home              Anticipated d/c date is: 2 days              Patient currently is not medically stable to d/c.   Difficult to place patient No  Level of care: Level of care: Med-Surg   The medical decision making on this patient was of high complexity and the patient is at high risk for clinical deterioration, therefore this is a level 3 visit.  John Giovanni MD [facility]  If 7PM-7AM, please contact night-coverage [email or web address]  06/27/2020, 4:05 AM

## 2020-06-27 NOTE — ED Provider Notes (Signed)
50 year old female received a signout from [facility] [place] pending pain medication and re-evaluation. Her his HPI:   "Lindsey [name] is a 51 y.o. female history of sickle cell disease.  Patient presents today for sickle cell pain crisis onset around 1 PM today she reports severe aching pain of her legs constant nonradiating no clear aggravating or alleviating factors she attempted her home narcotic medication without improvement.  She reports this feels exactly similar to previous episodes of sickle cell pain crisis and has no abnormal features.    Patient denies fever/chills, headache, chest pain/shortness of breath, cough, abdominal pain, vomiting, diarrhea, numbness/weakness, tingling, extremity swelling/color change, fall/injury or any additional concerns"  Physical Exam  BP 120/74   Pulse 86   Temp 98.6 F (37 C) (Oral)   Resp 20   Ht 5' (1.524 m)   Wt 63.5 kg   LMP 06/02/2020   SpO2 95%   BMI 27.34 kg/m   Physical Exam Vitals and nursing note reviewed.  Constitutional:      Appearance: She is well-developed and well-nourished. She is not ill-appearing or diaphoretic.     Comments: NAD  HENT:     Head: Normocephalic and atraumatic.  Cardiovascular:     Rate and Rhythm: Normal rate.  Pulmonary:     Effort: Pulmonary effort is normal.  Musculoskeletal:        General: Normal range of motion.     Cervical back: Normal range of motion.  Neurological:     Mental Status: She is alert and oriented to person, place, and time.     ED Course/Procedures     Procedures  MDM   50 year old female with a history of sickle cell anemia and chronic opioid use received at sign out from [facility] [place] pending pain medication reevaluation.  Please see his note for further work-up and medical decision making.   Labs have been reviewed and independently interpreted by me. Hemoglobin is stable 11.2. Reticulocyte count is appropriately elevated. CK level is normal. Creatinine level is 1.02, up  from 0.74 she did initially have some tachycardia on arrival that has resolved with IV fluids. I did consider PE, but she is having no chest pain or shortness of breath.  I did review the patient's chart, which indicated that she called her PCP yesterday for pain medication refill, but this was not provided due to inconsistencies in the patient's PMP. This may be contributing to her symptoms today.  On my evaluation, pain has not improved despite multiple doses of pain medication. Consulted the hospitalist team and Dr. Loney Loh will accept the patient for admission.       Frederik Pear A, PA-C 06/27/20 0201    Sabino Donovan, MD 06/27/20 [phone]

## 2020-06-27 NOTE — ED Notes (Signed)
Pt in bed sleeping, respirations even and unlabored. Will continue to monitor.

## 2020-06-28 DIAGNOSIS — M25512 Pain in left shoulder: Secondary | ICD-10-CM | POA: Diagnosis present

## 2020-06-28 DIAGNOSIS — Z9049 Acquired absence of other specified parts of digestive tract: Secondary | ICD-10-CM | POA: Diagnosis not present

## 2020-06-28 DIAGNOSIS — Z20822 Contact with and (suspected) exposure to covid-19: Secondary | ICD-10-CM | POA: Diagnosis present

## 2020-06-28 DIAGNOSIS — D72829 Elevated white blood cell count, unspecified: Secondary | ICD-10-CM | POA: Diagnosis not present

## 2020-06-28 DIAGNOSIS — M79605 Pain in left leg: Secondary | ICD-10-CM | POA: Diagnosis present

## 2020-06-28 DIAGNOSIS — R059 Cough, unspecified: Secondary | ICD-10-CM | POA: Diagnosis not present

## 2020-06-28 DIAGNOSIS — D5700 Hb-SS disease with crisis, unspecified: Secondary | ICD-10-CM | POA: Diagnosis present

## 2020-06-28 DIAGNOSIS — F17290 Nicotine dependence, other tobacco product, uncomplicated: Secondary | ICD-10-CM | POA: Diagnosis present

## 2020-06-28 DIAGNOSIS — E876 Hypokalemia: Secondary | ICD-10-CM

## 2020-06-28 DIAGNOSIS — G894 Chronic pain syndrome: Secondary | ICD-10-CM | POA: Diagnosis present

## 2020-06-28 DIAGNOSIS — Z79899 Other long term (current) drug therapy: Secondary | ICD-10-CM | POA: Diagnosis not present

## 2020-06-28 NOTE — Progress Notes (Addendum)
Patient ID: Lindsey [name], female   DOB: 1971-04-13, 50 y.o.   MRN: [id number] Subjective: Lindsey [name] is a 50 year old female with a medical history significant for sickle cell disease, chronic pain syndrome, opiate dependence and tolerance, and history of anemia of chronic disease was admitted for sickle cell pain crisis.  Patient is still complaining of significant amount of pain but slightly better than yesterday. She still has cough but no chest pain or SOB. COVID negative. She denies any fever, nausea, vomiting or diarrhea. No urinary symptom.   Objective:  Vital signs in last 24 hours:  Vitals:   06/28/20 1035 06/28/20 1140 06/28/20 1609 06/28/20 1739  BP: 100/62  105/70   Pulse: 71  63   Resp: 15 15 13 15   Temp: 98.6 F (37 C)  98.4 F (36.9 C)   TempSrc: Oral  Oral   SpO2: 100% 100% 98% 100%  Weight:      Height:        Intake/Output from previous day:   Intake/Output Summary (Last 24 hours) at 06/28/2020 1817 Last data filed at 06/28/2020 0259 Gross per 24 hour  Intake 1218.19 ml  Output -  Net 1218.19 ml    Physical Exam: General: Alert, awake, oriented x3, in no acute distress.  HEENT: /AT PEERL, EOMI Neck: Trachea midline,  no masses, no thyromegal,y no JVD, no carotid bruit OROPHARYNX:  Moist, No exudate/ erythema/lesions.  Heart: Regular rate and rhythm, without murmurs, rubs, gallops, PMI non-displaced, no heaves or thrills on palpation.  Lungs: Clear to auscultation, no wheezing or rhonchi noted. No increased vocal fremitus resonant to percussion  Abdomen: Soft, nontender, nondistended, positive bowel sounds, no masses no hepatosplenomegaly noted..  Neuro: No focal neurological deficits noted cranial nerves II through XII grossly intact. DTRs 2+ bilaterally upper and lower extremities. Strength 5 out of 5 in bilateral upper and lower extremities. Musculoskeletal: No warm swelling or erythema around joints, no spinal tenderness noted. Psychiatric: Patient  alert and oriented x3, good insight and cognition, good recent to remote recall. Lymph node survey: No cervical axillary or inguinal lymphadenopathy noted.  Lab Results:  Basic Metabolic Panel:    Component Value Date/Time   NA 141 06/26/2020 2130   NA 140 01/07/2020 1127   K 3.4 (L) 06/26/2020 2130   CL 110 06/26/2020 2130   CO2 22 06/26/2020 2130   BUN 7 06/26/2020 2130   BUN 12 01/07/2020 1127   CREATININE 1.02 (H) 06/26/2020 2130   GLUCOSE 111 (H) 06/26/2020 2130   CALCIUM 9.1 06/26/2020 2130   CBC:    Component Value Date/Time   WBC 11.3 (H) 06/27/2020 0500   HGB 9.2 (L) 06/27/2020 0500   HGB 11.5 01/07/2020 1127   HCT RESULTS UNAVAILABLE DUE TO INTERFERING SUBSTANCE 06/27/2020 0500   HCT 34.3 01/07/2020 1127   PLT 329 06/27/2020 0500   PLT 436 01/07/2020 1127   MCV RESULTS UNAVAILABLE DUE TO INTERFERING SUBSTANCE 06/27/2020 0500   MCV 96 01/07/2020 1127   NEUTROABS 6.2 06/26/2020 2130   NEUTROABS 7.6 (H) 01/07/2020 1127   LYMPHSABS 4.8 (H) 06/26/2020 2130   LYMPHSABS 2.0 01/07/2020 1127   MONOABS 0.7 06/26/2020 2130   EOSABS 0.4 06/26/2020 2130   EOSABS 0.4 01/07/2020 1127   BASOSABS 0.1 06/26/2020 2130   BASOSABS 0.1 01/07/2020 1127    Recent Results (from the past 240 hour(s))  Resp Panel by RT-PCR (Flu A&B, Covid) Nasopharyngeal Swab     Status: None   Collection Time: 06/26/20 11:34 PM  Specimen: Nasopharyngeal Swab; Nasopharyngeal(NP) swabs in vial transport medium  Result Value Ref Range Status   SARS Coronavirus 2 by RT PCR NEGATIVE NEGATIVE Final    Comment: (NOTE) SARS-CoV-2 target nucleic acids are NOT DETECTED.  The SARS-CoV-2 RNA is generally detectable in upper respiratory specimens during the acute phase of infection. The lowest concentration of SARS-CoV-2 viral copies this assay can detect is 138 copies/mL. A negative result does not preclude SARS-Cov-2 infection and should not be used as the sole basis for treatment or other patient  management decisions. A negative result may occur with  improper specimen collection/handling, submission of specimen other than nasopharyngeal swab, presence of viral mutation(s) within the areas targeted by this assay, and inadequate number of viral copies(<138 copies/mL). A negative result must be combined with clinical observations, patient history, and epidemiological information. The expected result is Negative.  Fact Sheet for Patients:  BloggerCourse.com  Fact Sheet for Healthcare Providers:  SeriousBroker.it  This test is no t yet approved or cleared by the [place] FDA and  has been authorized for detection and/or diagnosis of SARS-CoV-2 by FDA under an Emergency Use Authorization (EUA). This EUA will remain  in effect (meaning this test can be used) for the duration of the COVID-19 declaration under Section 564(b)(1) of the Act, 21 U.S.C.section 360bbb-3(b)(1), unless the authorization is terminated  or revoked sooner.       Influenza A by PCR NEGATIVE NEGATIVE Final   Influenza B by PCR NEGATIVE NEGATIVE Final    Comment: (NOTE) The Xpert Xpress SARS-CoV-2/FLU/RSV plus assay is intended as an aid in the diagnosis of influenza from Nasopharyngeal swab specimens and should not be used as a sole basis for treatment. Nasal washings and aspirates are unacceptable for Xpert Xpress SARS-CoV-2/FLU/RSV testing.  Fact Sheet for Patients: BloggerCourse.com  Fact Sheet for Healthcare Providers: SeriousBroker.it  This test is not yet approved or cleared by the [place] FDA and has been authorized for detection and/or diagnosis of SARS-CoV-2 by FDA under an Emergency Use Authorization (EUA). This EUA will remain in effect (meaning this test can be used) for the duration of the COVID-19 declaration under Section 564(b)(1) of the Act, 21 U.S.C. section 360bbb-3(b)(1),  unless the authorization is terminated or revoked.  Performed at [facility] [address], [place], [place] [address]     Studies/Results: No results found.  Medications: Scheduled Meds: . enoxaparin (LOVENOX) injection  40 mg Subcutaneous Q24H  . HYDROmorphone   Intravenous Q4H  .  HYDROmorphone (DILAUDID) injection  2 mg Intravenous Once  . potassium chloride  40 mEq Oral Once  . senna-docusate  1 tablet Oral BID   Continuous Infusions: . sodium chloride 100 mL/hr at 06/28/20 0808  . diphenhydrAMINE     PRN Meds:.acetaminophen, benzonatate, diphenhydrAMINE **OR** diphenhydrAMINE, lip balm, naloxone **AND** sodium chloride flush, ondansetron (ZOFRAN) IV, polyethylene glycol, promethazine  Consultants:  None  Procedures:  None  Antibiotics:  None  Assessment/Plan: Principal Problem:   Sickle cell pain crisis (HCC) Active Problems:   Leukocytosis   Cough   Hypokalemia  1. Hb Sickle Cell Disease with crisis: Continue IVF 0.45% Saline @ 100 mls/hour, continue weight based Dilaudid PCA at current setting, continue IV Toradol 15 mg Q 6 H for a total of 5 doses, Monitor vitals very closely, Re-evaluate pain scale regularly, 2 L of Oxygen by [place]. 2. Sickle Cell Anemia: Hb is stable at baseline today, there is no clinical indication for blood transfusion today. Will continue  to monitor closely, repeat labs in am. 3. Chronic pain Syndrome: Restart and continue home pain medications. Patient counseled extensively about non-pharmacologic means of pain management.  Code Status: Full Code Family Communication: N/A Disposition Plan: Not yet ready for discharge  Duong Haydel  If 7PM-7AM, please contact night-coverage.  06/28/2020, 6:17 PM  LOS: 0 days

## 2020-06-29 ENCOUNTER — Other Ambulatory Visit: Payer: Self-pay

## 2020-06-29 ENCOUNTER — Other Ambulatory Visit: Payer: Self-pay | Admitting: Internal Medicine

## 2020-06-29 DIAGNOSIS — G894 Chronic pain syndrome: Secondary | ICD-10-CM

## 2020-06-29 LAB — CBC WITH DIFFERENTIAL/PLATELET
Abs Immature Granulocytes: 0.03 10*3/uL (ref 0.00–0.07)
Basophils Absolute: 0 10*3/uL (ref 0.0–0.1)
Basophils Relative: 0 %
Eosinophils Absolute: 0.9 10*3/uL — ABNORMAL HIGH (ref 0.0–0.5)
Eosinophils Relative: 8 %
HCT: 23.6 % — ABNORMAL LOW (ref 36.0–46.0)
Hemoglobin: 8.8 g/dL — ABNORMAL LOW (ref 12.0–15.0)
Immature Granulocytes: 0 %
Lymphocytes Relative: 32 %
Lymphs Abs: 3.6 10*3/uL (ref 0.7–4.0)
MCH: 32.4 pg (ref 26.0–34.0)
MCHC: 37.3 g/dL — ABNORMAL HIGH (ref 30.0–36.0)
MCV: 86.8 fL (ref 80.0–100.0)
Monocytes Absolute: 0.8 10*3/uL (ref 0.1–1.0)
Monocytes Relative: 7 %
Neutro Abs: 5.9 10*3/uL (ref 1.7–7.7)
Neutrophils Relative %: 53 %
Platelets: 310 10*3/uL (ref 150–400)
RBC: 2.72 MIL/uL — ABNORMAL LOW (ref 3.87–5.11)
RDW: 13.8 % (ref 11.5–15.5)
WBC: 11.4 10*3/uL — ABNORMAL HIGH (ref 4.0–10.5)
nRBC: 0 % (ref 0.0–0.2)

## 2020-06-29 LAB — BASIC METABOLIC PANEL
Anion gap: 7 (ref 5–15)
BUN: 9 mg/dL (ref 6–20)
CO2: 22 mmol/L (ref 22–32)
Calcium: 8.5 mg/dL — ABNORMAL LOW (ref 8.9–10.3)
Chloride: 112 mmol/L — ABNORMAL HIGH (ref 98–111)
Creatinine, Ser: 0.71 mg/dL (ref 0.44–1.00)
GFR, Estimated: >60 mL/min (ref >60)
Glucose, Bld: 101 mg/dL — ABNORMAL HIGH (ref 70–99)
Potassium: 4 mmol/L (ref 3.5–5.1)
Sodium: 141 mmol/L (ref 135–145)

## 2020-06-29 MED ORDER — OXYCODONE HCL ER 10 MG PO T12A: 10.0000 mg | EXTENDED_RELEASE_TABLET | Freq: Two times a day (BID) | ORAL | 0 refills | Status: DC

## 2020-06-29 NOTE — Discharge Instructions (Signed)
Chronic Back Pain When back pain lasts longer than 3 months, it is called chronic back pain. Pain may get worse at certain times (flare-ups). There are things you can do at home to manage your pain. Follow these instructions at home: Pay attention to any changes in your symptoms. Take these actions to help with your pain: Managing pain and stiffness  If told, put ice on the painful area. Your doctor may tell you to use ice for 24-48 hours after the flare-up starts. To do this: ? Put ice in a plastic bag. ? Place a towel between your skin and the bag. ? Leave the ice on for 20 minutes, 2-3 times a day.  If told, put heat on the painful area. Do this as often as told by your doctor. Use the heat source that your doctor recommends, such as a moist heat pack or a heating pad. ? Place a towel between your skin and the heat source. ? Leave the heat on for 20-30 minutes. ? Take off the heat if your skin turns bright red. This is especially important if you are unable to feel pain, heat, or cold. You may have a greater risk of getting burned.  Soak in a warm bath. This can help relieve pain.      Activity  Avoid bending and other activities that make pain worse.  When standing: ? Keep your upper back and neck straight. ? Keep your shoulders pulled back. ? Avoid slouching.  When sitting: ? Keep your back straight. ? Relax your shoulders. Do not round your shoulders or pull them backward.  Do not sit or stand in one place for long periods of time.  Take short rest breaks during the day. Lying down or standing is usually better than sitting. Resting can help relieve pain.  When sitting or lying down for a long time, do some mild activity or stretching. This will help to prevent stiffness and pain.  Get regular exercise. Ask your doctor what activities are safe for you.  Do not lift anything that is heavier than 10 lb (4.5 kg) or the limit that you are told, until your doctor says that it  is safe.  To prevent injury when you lift things: ? Bend your knees. ? Keep the weight close to your body. ? Avoid twisting.  Sleep on a firm mattress. Try lying on your side with your knees slightly bent. If you lie on your back, put a pillow under your knees.   Medicines  Treatment may include medicines for pain and swelling taken by mouth or put on the skin, prescription pain medicine, or muscle relaxants.  Take over-the-counter and prescription medicines only as told by your doctor.  Ask your doctor if the medicine prescribed to you: ? Requires you to avoid driving or using machinery. ? Can cause trouble pooping (constipation). You may need to take these actions to prevent or treat trouble pooping:  Drink enough fluid to keep your pee (urine) pale yellow.  Take over-the-counter or prescription medicines.  Eat foods that are high in fiber. These include beans, whole grains, and fresh fruits and vegetables.  Limit foods that are high in fat and sugars. These include fried or sweet foods. General instructions  Do not use any products that contain nicotine or tobacco, such as cigarettes, e-cigarettes, and chewing tobacco. If you need help quitting, ask your doctor.  Keep all follow-up visits as told by your doctor. This is important. Contact a doctor if:  Your pain does not get better with rest or medicine.  Your pain gets worse, or you have new pain.  You have a high fever.  You lose weight very quickly.  You have trouble doing your normal activities. Get help right away if:  One or both of your legs or feet feel weak.  One or both of your legs or feet lose feeling (have numbness).  You have trouble controlling when you poop (have a bowel movement) or pee (urinate).  You have bad back pain and: ? You feel like you may vomit (nauseous), or you vomit. ? You have pain in your belly (abdomen). ? You have shortness of breath. ? You faint. Summary  When back pain  lasts longer than 3 months, it is called chronic back pain.  Pain may get worse at certain times (flare-ups).  Use ice and heat as told by your doctor. Your doctor may tell you to use ice after flare-ups. This information is not intended to replace advice given to you by your health care provider. Make sure you discuss any questions you have with your health care provider. Document Revised: 06/05/2019 Document Reviewed: 06/05/2019 Elsevier Patient Education  2021 [employer] Inc[employer]   Chronic Pain, Adult Chronic pain is a type of pain that lasts or keeps coming back for at least 3-6 months. You may have headaches, pain in the abdomen, or pain in other areas of the body. Chronic pain may be related to an illness, such as fibromyalgia or complex regional pain syndrome. Chronic pain may also be related to an injury or a health condition. Sometimes, the cause of chronic pain is not known. Chronic pain can make it hard for you to do daily activities. If not treated, chronic pain can lead to anxiety and depression. Treatment depends on the cause and severity of your pain. You may need to work with a pain specialist to come up with a treatment plan. The plan may include medicine, counseling, and physical therapy. Many people benefit from a combination of two or more types of treatment to control their pain. Follow these instructions at home: Medicines  Take over-the-counter and prescription medicines only as told by your health care provider.  Ask your health care provider if the medicine prescribed to you: ? Requires you to avoid driving or using machinery. ? Can cause constipation. You may need to take these actions to prevent or treat constipation:  Drink enough fluid to keep your urine pale yellow.  Take over-the-counter or prescription medicines.  Eat foods that are high in fiber, such as beans, whole grains, and fresh fruits and vegetables.  Limit foods that are high in fat and processed  sugars, such as fried or sweet foods. Treatment plan Follow your treatment plan as told by your health care provider. This may include:  Gentle, regular exercise.  Eating a healthy diet that includes foods such as vegetables, fruits, fish, and lean meats.  Cognitive or behavioral therapy that changes the way you think or act in response to the pain. This may help improve how you feel.  Working with a physical therapist.  Meditation, yoga, acupuncture, or massage therapy.  Aroma, color, light, or sound therapy.  Local electrical stimulation. The electrical pulses help to relieve pain by temporarily stopping the nerve impulses that cause you to feel pain.  Injections. These deliver numbing or pain-relieving medicines into the spine or the area of pain.   Lifestyle  Ask your health care provider whether you should  keep a pain diary. Your health care provider will tell you what information to write in the diary. This may include when you have pain, what the pain feels like, and how medicines and other behaviors or treatments help to reduce the pain.  Consider talking with a mental health care provider about how to manage chronic pain.  Consider joining a chronic pain support group.  Try to control or lower your stress levels. Talk with your health care provider about ways to do this.   General instructions  Learn as much as you can about how to manage your chronic pain. Ask your health care provider if an intensive pain rehabilitation program or a chronic pain specialist would be helpful.  Check your pain level as told by your health care provider. Ask your health care provider if you should use a pain scale.  It is up to you to get the results of any tests that were done. Ask your health care provider, or the department that is doing the tests, when your results will be ready.  Keep all follow-up visits as told by your health care provider. This is important. Contact a health care  provider if:  Your pain gets worse, or you have new pain.  You have trouble sleeping.  You have trouble doing your normal activities.  Your pain is not controlled with treatment.  You have side effects from pain medicine.  You feel weak.  You notice any other changes that show that your condition is getting worse. Get help right away if:  You lose feeling or have numbness in your body.  You lose control of bowel or bladder function.  Your pain suddenly gets much worse.  You develop shaking or chills.  You develop confusion.  You develop chest pain.  You have trouble breathing or shortness of breath.  You pass out.  You have thoughts about hurting yourself or others. If you ever feel like you may hurt yourself or others, or have thoughts about taking your own life, get help right away. Go to your nearest emergency department or:  Call your local emergency services (911 in the [place]  Call a suicide crisis helpline, such as the National Suicide Prevention Lifeline at 325-037-9675. This is open 24 hours a day in the [place]  Text the Crisis Text Line at 7812933676 (in the [place] Summary  Chronic pain is a type of pain that lasts or keeps coming back for at least 3-6 months.  Chronic pain may be related to an illness, injury, or other health condition. Sometimes, the cause of chronic pain is not known.  Treatment depends on the cause and severity of your pain.  Many people benefit from a combination of two or more types of treatment to control their pain.  Follow your treatment plan as told by your health care provider. This information is not intended to replace advice given to you by your health care provider. Make sure you discuss any questions you have with your health care provider. Document Revised: 01/10/2019 Document Reviewed: 01/10/2019 Elsevier Patient Education  2021 [employer].

## 2020-06-29 NOTE — Discharge Summary (Signed)
Physician Discharge Summary  Lindsey [name] KTG:[id number] DOB: 08/11/1970 DOA: 06/26/2020  PCP: Massie Maroon, FNP  Admit date: 06/26/2020  Discharge date: 06/29/2020  Discharge Diagnoses:  Principal Problem:   Sickle cell pain crisis (HCC) Active Problems:   Leukocytosis   Cough   Hypokalemia  Discharge Condition: Stable  Disposition:   Follow-up Information    Massie Maroon, FNP. Schedule an appointment as soon as possible for a visit in 1 week(s).   Specialty: Family Medicine Contact information: [address] [place] [address] [phone]              Pt is discharged home in good condition and is to follow up with Massie Maroon, FNP this week to have labs evaluated. TDHRCBU Debo is instructed to increase activity slowly and balance with rest for the next few days, and use prescribed medication to complete treatment of pain  Diet: Regular Wt Readings from Last 3 Encounters:  06/27/20 63.5 kg  04/20/20 63.5 kg  04/07/20 63.9 kg   History of present illness:  Lindsey [name] is a 50 y.o. female with medical history significant of sickle cell disease, chronic pain syndrome, opiate dependence and tolerance presenting with a chief complaint of sickle cell pain.  Patient reports 1 day history of severe left leg pain which she feels is similar to her prior sickle cell crisis episodes.  Denies any pain in her right leg.  Denies history of blood clots.  States since she has been emergency room, she has started experiencing slight left shoulder/arm pain which she feels is similar to the pain she has experienced during her prior sickle cell crisis episodes.  Denies any chest pain.  She ran out of her home oxycodone.  Reports dry cough for the past 3 weeks for which she states she had a video visit with her PCP and was diagnosed with bronchitis.  Patient states she was prescribed an inhaler and another medication but she has not been taking them.  Denies  shortness of breath.  She is not vaccinated against COVID.  Per chart, patient had called her PCP yesterday for pain medication refill but this was not provided due to inconsistencies when PDMP was reviewed.  ED Course: Slightly tachycardic on arrival, heart rate improved after fluid bolus.  Remainder of vital signs stable.  WBC 12.2, hemoglobin 11.2 (at baseline), platelet count 421K.  Sodium 141, potassium 3.4, chloride 110, bicarb 22, BUN 7, creatinine 1.0 (baseline 0.7-0.8), glucose 111.  Beta hCG negative. SARS-CoV-2 PCR test negative.  Influenza panel negative.  CK normal. Patient was given multiple doses of Dilaudid.  Also given Phenergan and 500 cc normal saline bolus.  Hospital Course:  Patient was admitted for sickle cell pain crisis and managed appropriately with IVF, IV Dilaudid via PCA and IV Toradol, as well as other adjunct therapies per sickle cell pain management protocols.  She got better on this regimen, pain slowly returned to baseline.  Hemoglobin remained stable at baseline, she did not require blood transfusion during this admission.  Potassium was repleted back to normal.  As at today, patient ambulating well with no significant pain and tolerating p.o. intake with no restrictions.  Patient requested to be discharged home.  Patient was therefore discharged home today in a hemodynamically stable condition.   [name] will follow-up with PCP within 1 week of this discharge. Mitzi Hansen counseled extensively about nonpharmacologic means of pain management, patient verbalized understanding and was appreciative of  the care  received during this admission.   We discussed the need for good hydration, monitoring of hydration status, avoidance of heat, cold, stress, and infection triggers. We discussed the need to be adherent with taking Hydrea and other home medications. Patient was reminded of the need to seek medical attention immediately if any symptom of bleeding, anemia, or  infection occurs.  Discharge Exam: Vitals:   06/29/20 0945 06/29/20 1252  BP: 125/73   Pulse: 69   Resp: 19 18  Temp: 100.3 F (37.9 C)   SpO2: 100% 98%   Vitals:   06/29/20 0545 06/29/20 0820 06/29/20 0945 06/29/20 1252  BP:   125/73   Pulse:   69   Resp: 14 16 19 18   Temp:   100.3 F (37.9 C)   TempSrc:   Oral   SpO2: 97% 96% 100% 98%  Weight:      Height:       General appearance : Awake, alert, not in any distress. Speech Clear. Not toxic looking HEENT: Atraumatic and Normocephalic, pupils equally reactive to light and accomodation Neck: Supple, no JVD. No cervical lymphadenopathy.  Chest: Good air entry bilaterally, no added sounds  CVS: S1 S2 regular, no murmurs.  Abdomen: Bowel sounds present, Non tender and not distended with no gaurding, rigidity or rebound. Extremities: B/L Lower Ext shows no edema, both legs are warm to touch Neurology: Awake alert, and oriented X 3, CN II-XII intact, Non focal Skin: No Rash  Discharge Instructions  Discharge Instructions    Diet - low sodium heart healthy   Complete by: As directed    Increase activity slowly   Complete by: As directed      Allergies as of 06/29/2020   No Known Allergies     Medication List    STOP taking these medications   Oxycodone HCl 10 MG Tabs     TAKE these medications   albuterol 108 (90 Base) MCG/ACT inhaler Commonly known as: VENTOLIN HFA INHALE 2 PUFFS INTO THE LUNGS EVERY 6 HOURS AS NEEDED FOR WHEEZING OR SHORTNESS OF BREATH What changed: reasons to take this   ergocalciferol 1.25 MG (50000 UT) capsule Commonly known as: VITAMIN D2 Take 1 capsule (50,000 Units total) by mouth once a week.   gabapentin 300 MG capsule Commonly known as: NEURONTIN Take 1 capsule (300 mg total) by mouth 3 (three) times daily.   GOODY HEADACHE PO Take 1 packet by mouth every 8 (eight) hours as needed (pain/headache).   ibuprofen 800 MG tablet Commonly known as: ADVIL TAKE 1 TABLET(800 MG) BY  MOUTH EVERY 8 HOURS AS NEEDED What changed: See the new instructions.   Melatonin 10 MG Tabs Take 10 mg by mouth at bedtime.       The results of significant diagnostics from this hospitalization (including imaging, microbiology, ancillary and laboratory) are listed below for reference.    Significant Diagnostic Studies: No results found.  Microbiology: Recent Results (from the past 240 hour(s))  Resp Panel by RT-PCR (Flu A&B, Covid) Nasopharyngeal Swab     Status: None   Collection Time: 06/26/20 11:34 PM   Specimen: Nasopharyngeal Swab; Nasopharyngeal(NP) swabs in vial transport medium  Result Value Ref Range Status   SARS Coronavirus 2 by RT PCR NEGATIVE NEGATIVE Final    Comment: (NOTE) SARS-CoV-2 target nucleic acids are NOT DETECTED.  The SARS-CoV-2 RNA is generally detectable in upper respiratory specimens during the acute phase of infection. The lowest concentration of SARS-CoV-2 viral copies this assay can detect is  138 copies/mL. A negative result does not preclude SARS-Cov-2 infection and should not be used as the sole basis for treatment or other patient management decisions. A negative result may occur with  improper specimen collection/handling, submission of specimen other than nasopharyngeal swab, presence of viral mutation(s) within the areas targeted by this assay, and inadequate number of viral copies(<138 copies/mL). A negative result must be combined with clinical observations, patient history, and epidemiological information. The expected result is Negative.  Fact Sheet for Patients:  BloggerCourse.com  Fact Sheet for Healthcare Providers:  SeriousBroker.it  This test is no t yet approved or cleared by the [place] FDA and  has been authorized for detection and/or diagnosis of SARS-CoV-2 by FDA under an Emergency Use Authorization (EUA). This EUA will remain  in effect (meaning this test can be  used) for the duration of the COVID-19 declaration under Section 564(b)(1) of the Act, 21 U.S.C.section 360bbb-3(b)(1), unless the authorization is terminated  or revoked sooner.       Influenza A by PCR NEGATIVE NEGATIVE Final   Influenza B by PCR NEGATIVE NEGATIVE Final    Comment: (NOTE) The Xpert Xpress SARS-CoV-2/FLU/RSV plus assay is intended as an aid in the diagnosis of influenza from Nasopharyngeal swab specimens and should not be used as a sole basis for treatment. Nasal washings and aspirates are unacceptable for Xpert Xpress SARS-CoV-2/FLU/RSV testing.  Fact Sheet for Patients: BloggerCourse.com  Fact Sheet for Healthcare Providers: SeriousBroker.it  This test is not yet approved or cleared by the [place] FDA and has been authorized for detection and/or diagnosis of SARS-CoV-2 by FDA under an Emergency Use Authorization (EUA). This EUA will remain in effect (meaning this test can be used) for the duration of the COVID-19 declaration under Section 564(b)(1) of the Act, 21 U.S.C. section 360bbb-3(b)(1), unless the authorization is terminated or revoked.  Performed at [facility], [address], [place], [place] [address]      Labs: Basic Metabolic Panel: Recent Labs  Lab 06/26/20 2130 06/27/20 0411 06/29/20 0525  NA 141  --  141  K 3.4*  --  4.0  CL 110  --  112*  CO2 22  --  22  GLUCOSE 111*  --  101*  BUN 7  --  9  CREATININE 1.02*  --  0.71  CALCIUM 9.1  --  8.5*  MG  --  1.9  --    Liver Function Tests: Recent Labs  Lab 06/26/20 2130  AST 17  ALT 13  ALKPHOS 36*  BILITOT 1.6*  PROT 7.7  ALBUMIN 4.6   No results for input(s): LIPASE, AMYLASE in the last 168 hours. No results for input(s): AMMONIA in the last 168 hours. CBC: Recent Labs  Lab 06/26/20 2130 06/27/20 0500 06/29/20 0525  WBC 12.2* 11.3* 11.4*  NEUTROABS 6.2  --  5.9  HGB 11.2* 9.2* 8.8*  HCT  29.9* RESULTS UNAVAILABLE DUE TO INTERFERING SUBSTANCE 23.6*  MCV 87.4 RESULTS UNAVAILABLE DUE TO INTERFERING SUBSTANCE 86.8  PLT 421* 329 310   Cardiac Enzymes: Recent Labs  Lab 06/26/20 2334  CKTOTAL 47   BNP: Invalid input(s): POCBNP CBG: No results for input(s): GLUCAP in the last 168 hours.  Time coordinating discharge: 50 minutes  Signed:  Olugbemiga [name]  [facility] 06/29/2020, 1:06 PM

## 2020-06-30 ENCOUNTER — Telehealth: Payer: Self-pay

## 2020-06-30 NOTE — Telephone Encounter (Signed)
Transition Care Management Unsuccessful Follow-up Telephone Call  Date of discharge and from where:  06/29/2020 from [facility]  Attempts:  1st Attempt  Reason for unsuccessful TCM follow-up call:  Left voice message

## 2020-07-01 ENCOUNTER — Telehealth: Payer: Self-pay

## 2020-07-01 ENCOUNTER — Other Ambulatory Visit: Payer: Self-pay | Admitting: Family Medicine

## 2020-07-01 DIAGNOSIS — D5700 Hb-SS disease with crisis, unspecified: Secondary | ICD-10-CM

## 2020-07-01 MED ORDER — GABAPENTIN 300 MG PO CAPS: 300.0000 mg | ORAL_CAPSULE | Freq: Three times a day (TID) | ORAL | 1 refills | Status: DC

## 2020-07-01 NOTE — Telephone Encounter (Signed)
Pt request for med refill on Gapentin 300mg 

## 2020-07-01 NOTE — Progress Notes (Signed)
Meds ordered this encounter  Medications  . gabapentin (NEURONTIN) 300 MG capsule    Sig: Take 1 capsule (300 mg total) by mouth 3 (three) times daily.    Dispense:  90 capsule    Refill:  1    Order Specific Question:   Supervising Provider    Answer:   Quentin Angst [[id number]]    [name]  APRN, MSN, FNP-C Patient Care [facility] [address] [place], [place] [address] [phone]

## 2020-07-01 NOTE — Telephone Encounter (Signed)
Transition Care Management Unsuccessful Follow-up Telephone Call  Date of discharge and from where:  06/29/2020 from [facility]  Attempts:  2nd Attempt  Reason for unsuccessful TCM follow-up call:  Left voice message

## 2020-07-02 NOTE — Telephone Encounter (Signed)
Transition Care Management Follow-up Telephone Call  Date of discharge and from where: 06/29/2020 from [facility]  How have you been since you were released from the hospital? Pt stated that she is feeling much better.   Any questions or concerns? No  Items Reviewed:  Did the pt receive and understand the discharge instructions provided? Yes   Medications obtained and verified? Yes   Other? No   Any new allergies since your discharge? No   Dietary orders reviewed? N/A  Do you have support at home? Yes   Functional Questionnaire: (I = Independent and D = Dependent) ADLs: I  Bathing/Dressing- I  Meal Prep- I  Eating- I  Maintaining continence- I  Transferring/Ambulation- I  Managing Meds- I   Follow up appointments reviewed:   PCP Hospital f/u appt confirmed? No  Forwarding to PCP office for assistance with scheduling the follow up appt.   Are transportation arrangements needed? No  If their condition worsens, is the pt aware to call PCP or go to the Emergency Dept.? Yes Was the patient provided with contact information for the PCP's office or ED? Yes Was to pt encouraged to call back with questions or concerns? Yes

## 2020-07-07 ENCOUNTER — Telehealth: Payer: Self-pay

## 2020-07-07 NOTE — Telephone Encounter (Signed)
Med refill   Oxycodone 10 mg  

## 2020-07-08 ENCOUNTER — Non-Acute Institutional Stay (HOSPITAL_COMMUNITY)
Admission: AD | Admit: 2020-07-08 | Discharge: 2020-07-08 | Disposition: A | Discharge status: Home / Self Care | Payer: Medicaid Other | Source: Ambulatory Visit | Attending: Internal Medicine | Admitting: Internal Medicine

## 2020-07-08 ENCOUNTER — Encounter (HOSPITAL_COMMUNITY): Payer: Self-pay | Admitting: Family Medicine

## 2020-07-08 ENCOUNTER — Telehealth (HOSPITAL_COMMUNITY): Payer: Self-pay

## 2020-07-08 DIAGNOSIS — D5700 Hb-SS disease with crisis, unspecified: Secondary | ICD-10-CM | POA: Diagnosis present

## 2020-07-08 DIAGNOSIS — Z79891 Long term (current) use of opiate analgesic: Secondary | ICD-10-CM | POA: Diagnosis not present

## 2020-07-08 DIAGNOSIS — F17210 Nicotine dependence, cigarettes, uncomplicated: Secondary | ICD-10-CM | POA: Insufficient documentation

## 2020-07-08 DIAGNOSIS — Z7982 Long term (current) use of aspirin: Secondary | ICD-10-CM | POA: Insufficient documentation

## 2020-07-08 DIAGNOSIS — Z79899 Other long term (current) drug therapy: Secondary | ICD-10-CM | POA: Insufficient documentation

## 2020-07-08 LAB — CBC WITH DIFFERENTIAL/PLATELET
Abs Immature Granulocytes: 0.01 10*3/uL (ref 0.00–0.07)
Basophils Absolute: 0.1 10*3/uL (ref 0.0–0.1)
Basophils Relative: 1 %
Eosinophils Absolute: 0.3 10*3/uL (ref 0.0–0.5)
Eosinophils Relative: 4 %
HCT: 28.1 % — ABNORMAL LOW (ref 36.0–46.0)
Hemoglobin: 10.7 g/dL — ABNORMAL LOW (ref 12.0–15.0)
Immature Granulocytes: 0 %
Lymphocytes Relative: 24 %
Lymphs Abs: 1.9 10*3/uL (ref 0.7–4.0)
MCH: 33.2 pg (ref 26.0–34.0)
MCHC: 38.1 g/dL — ABNORMAL HIGH (ref 30.0–36.0)
MCV: 87.3 fL (ref 80.0–100.0)
Monocytes Absolute: 0.4 10*3/uL (ref 0.1–1.0)
Monocytes Relative: 5 %
Neutro Abs: 5.3 10*3/uL (ref 1.7–7.7)
Neutrophils Relative %: 66 %
Platelets: 469 10*3/uL — ABNORMAL HIGH (ref 150–400)
RBC: 3.22 MIL/uL — ABNORMAL LOW (ref 3.87–5.11)
RDW: 13.6 % (ref 11.5–15.5)
WBC: 7.9 10*3/uL (ref 4.0–10.5)
nRBC: 0.3 % — ABNORMAL HIGH (ref 0.0–0.2)

## 2020-07-08 LAB — COMPREHENSIVE METABOLIC PANEL
ALT: 16 U/L (ref 0–44)
AST: 26 U/L (ref 15–41)
Albumin: 4.1 g/dL (ref 3.5–5.0)
Alkaline Phosphatase: 29 U/L — ABNORMAL LOW (ref 38–126)
Anion gap: 8 (ref 5–15)
BUN: 6 mg/dL (ref 6–20)
CO2: 23 mmol/L (ref 22–32)
Calcium: 9.1 mg/dL (ref 8.9–10.3)
Chloride: 111 mmol/L (ref 98–111)
Creatinine, Ser: 0.81 mg/dL (ref 0.44–1.00)
GFR, Estimated: >60 mL/min (ref >60)
Glucose, Bld: 90 mg/dL (ref 70–99)
Potassium: 4.4 mmol/L (ref 3.5–5.1)
Sodium: 142 mmol/L (ref 135–145)
Total Bilirubin: 1.5 mg/dL — ABNORMAL HIGH (ref 0.3–1.2)
Total Protein: 6.9 g/dL (ref 6.5–8.1)

## 2020-07-08 LAB — RAPID URINE DRUG SCREEN, HOSP PERFORMED
Amphetamines: NOT DETECTED
Barbiturates: NOT DETECTED
Benzodiazepines: POSITIVE — AB
Cocaine: NOT DETECTED
Opiates: POSITIVE — AB
Tetrahydrocannabinol: NOT DETECTED

## 2020-07-08 LAB — RETICULOCYTES
Immature Retic Fract: 18.3 % — ABNORMAL HIGH (ref 2.3–15.9)
RBC.: 3.25 MIL/uL — ABNORMAL LOW (ref 3.87–5.11)
Retic Count, Absolute: 113.1 10*3/uL (ref 19.0–186.0)
Retic Ct Pct: 3.5 % — ABNORMAL HIGH (ref 0.4–3.1)

## 2020-07-08 MED ORDER — NALOXONE HCL 0.4 MG/ML IJ SOLN: 0.4000 mg | INTRAMUSCULAR | Status: DC | PRN

## 2020-07-08 MED ORDER — DIPHENHYDRAMINE HCL 25 MG PO CAPS
25.0000 mg | ORAL_CAPSULE | ORAL | Status: DC | PRN
  Filled 2020-07-08: qty 1

## 2020-07-08 MED ORDER — HYDROMORPHONE 1 MG/ML IV SOLN
INTRAVENOUS | Status: DC
  Administered 2020-07-08: 30 mg via INTRAVENOUS
  Administered 2020-07-08: 9 mg via INTRAVENOUS
  Filled 2020-07-08: qty 30

## 2020-07-08 MED ORDER — SODIUM CHLORIDE 0.45 % IV SOLN: INTRAVENOUS | Status: DC

## 2020-07-08 MED ORDER — ACETAMINOPHEN 500 MG PO TABS
1000.0000 mg | ORAL_TABLET | Freq: Once | ORAL | Status: AC
  Administered 2020-07-08: 1000 mg via ORAL
  Filled 2020-07-08: qty 2

## 2020-07-08 MED ORDER — SODIUM CHLORIDE 0.9% FLUSH: 9.0000 mL | INTRAVENOUS | Status: DC | PRN

## 2020-07-08 MED ORDER — OXYCODONE HCL 10 MG PO TABS: 10.0000 mg | ORAL_TABLET | Freq: Four times a day (QID) | ORAL | 0 refills | Status: DC | PRN

## 2020-07-08 MED ORDER — KETOROLAC TROMETHAMINE 30 MG/ML IJ SOLN
15.0000 mg | Freq: Once | INTRAMUSCULAR | Status: AC
  Administered 2020-07-08: 15 mg via INTRAVENOUS
  Filled 2020-07-08: qty 1

## 2020-07-08 MED ORDER — ONDANSETRON HCL 4 MG/2ML IJ SOLN
4.0000 mg | Freq: Four times a day (QID) | INTRAMUSCULAR | Status: DC | PRN
  Filled 2020-07-08: qty 2

## 2020-07-08 NOTE — Progress Notes (Signed)
Patient admitted to the day hospital for treatment of sickle cell pain crisis. Patient reported pain rated 6/10 in the right leg . Patient placed on Dilaudid PCA, given PO tylenol, IV Toradol and hydrated with IV fluids. At discharge patient reported pain at 2/10. Discharge instructions given to patient. Alert, oriented and ambulatory at discharge.

## 2020-07-08 NOTE — Discharge Summary (Signed)
[facility] Discharge Summary   Patient ID: Lindsey [name] MRN: [id number] DOB/AGE: Aug 06, 1970 50 y.o.  Admit date: 07/08/2020 Discharge date: 07/08/2020  Primary Care Physician:  Massie Maroon, FNP  Admission Diagnoses:  Active Problems:   Sickle cell pain crisis [facility])   Discharge Medications:  Allergies as of 07/08/2020   No Known Allergies     Medication List    TAKE these medications   albuterol 108 (90 Base) MCG/ACT inhaler Commonly known as: VENTOLIN HFA INHALE 2 PUFFS INTO THE LUNGS EVERY 6 HOURS AS NEEDED FOR WHEEZING OR SHORTNESS OF BREATH What changed: reasons to take this   ergocalciferol 1.25 MG (50000 UT) capsule Commonly known as: VITAMIN D2 Take 1 capsule (50,000 Units total) by mouth once a week.   gabapentin 300 MG capsule Commonly known as: NEURONTIN Take 1 capsule (300 mg total) by mouth 3 (three) times daily.   GOODY HEADACHE PO Take 1 packet by mouth every 8 (eight) hours as needed (pain/headache).   ibuprofen 800 MG tablet Commonly known as: ADVIL TAKE 1 TABLET(800 MG) BY MOUTH EVERY 8 HOURS AS NEEDED What changed: See the new instructions.   Melatonin 10 MG Tabs Take 10 mg by mouth at bedtime.   oxyCODONE 10 mg 12 hr tablet Commonly known as: OXYCONTIN Take 1 tablet (10 mg total) by mouth every 12 (twelve) hours. What changed: Another medication with the same name was added. Make sure you understand how and when to take each.   Oxycodone HCl 10 MG Tabs Take 1 tablet (10 mg total) by mouth every 6 (six) hours as needed. What changed: You were already taking a medication with the same name, and this prescription was added. Make sure you understand how and when to take each.        Consults:  None  Significant Diagnostic Studies:  No results found.   History of present illness:  Lindsey [name] is a 50 year old female with a medical history significant for sickle cell disease, chronic pain syndrome, opiate dependence  and tolerance, history of anemia of chronic disease, and history of opiate abuse presents to sickle cell day clinic with complaints of generalized pain that is consistent with her previous pain crisis.  Patient says that pain is primarily to lower extremities.  Pain intensity started several days ago and current crisis is attributed to running out short acting sickle cell pain medication.  According to PDMP, patient's last prescription was filled on 06/16/2020 for oxycodone #60.  Also, patient filled prescription on 06/14/2019 for oxycodone #60.  Lindsey [name] was admitted to inpatient services on 06/26/2020 for sickle cell pain crisis.  At that time, patient attributed pain crisis to being out of short acting pain medications.  Pain intensity is 8/10 characterized as constant and throbbing.  She last had OxyContin this a.m. without sustained relief.  Also, patient is taking gabapentin as prescribed.  She denies any headache, chest pain, urinary symptoms, nausea, vomiting, or diarrhea.  No sick contacts, recent travel, or exposure to COVID-19.  [facility] Course: Patient admitted for management of sickle cell pain crisis Reviewed laboratory values, consistent with patient's baseline  Pain managed with IV Dilaudid via PCA Toradol 15 mg IV IV fluids, 0.45% saline at 100 mL/h Tylenol 1000 mg x 1 Pain intensity decreased to 2/10.  Patient is concerned about outpatient prescription for oxycodone.  Oxycodone 10 mg every 6 hours #60 was sent to patient's pharmacy.  Patient will follow-up with PCP on 07/21/2020  for Suboxone initiation. Patient alert, oriented, and ambulating without assistance. She will discharge home in a hemodynamically stable condition.  Discharge instruction: Resume all home medications.   Follow up with PCP as previously  scheduled.   Discussed the importance of drinking 64 ounces of water daily, dehydration of red blood cells may lead further sickling.   Avoid all stressors  that precipitate sickle cell pain crisis.     The patient was given clear instructions to go to ER or return to medical center if symptoms do not improve, worsen or new problems develop.     Physical Exam at Discharge:  BP 125/68 (BP Location: Right Arm)   Pulse 70   Temp 98.1 F (36.7 C) (Temporal)   Resp 14   LMP 07/08/2020   SpO2 100%   Physical Exam Constitutional:      Appearance: Normal appearance.  Eyes:     Pupils: Pupils are equal, round, and reactive to light.  Cardiovascular:     Rate and Rhythm: Normal rate and regular rhythm.     Pulses: Normal pulses.  Pulmonary:     Effort: Pulmonary effort is normal.  Abdominal:     General: Abdomen is flat. Bowel sounds are normal.  Musculoskeletal:        General: Normal range of motion.  Skin:    General: Skin is warm.  Neurological:     General: No focal deficit present.     Mental Status: She is alert. Mental status is at baseline.  Psychiatric:        Mood and Affect: Mood normal.        Thought Content: Thought content normal.        Judgment: Judgment normal.      Disposition at Discharge: Discharge disposition: 01-Home or Self Care       Discharge Orders: Discharge Instructions    Discharge patient   Complete by: As directed    Discharge disposition: 01-Home or Self Care   Discharge patient date: 07/08/2020      Condition at Discharge:   Stable  Time spent on Discharge:  Greater than 30 minutes.  Signed: [name]  APRN, MSN, FNP-C Patient Care [facility] [address] [place], [place] [address] [phone]  07/08/2020, 4:21 PM

## 2020-07-08 NOTE — Telephone Encounter (Signed)
Patient called in. Complains of pain in R leg rates 6/10. Denied chest pain, abd pain, fever, N/V/D. Wants to come in for treatment. Last took Oxycodone IR at 6:00pm yesterday evening , but states she is not out of that medication. Pt states she took controlled release pain medication at 6:00pm yesterday, and took Ibuprofen at 7:30am today. Pt states her husband is her transportation. Denies exposure to anyone who is covid positive in last two weeks, was tested for covid 2 weeks ago with negative results, denies any flu like symptoms today. [place] FNP made aware, approved for pt to be seen today. Pt notified, verbalized understanding.

## 2020-07-08 NOTE — BH Specialist Note (Signed)
Integrated Behavioral Health General Follow Up Note  07/08/2020 Name: Lindsey [name] MRN: [id number] DOB: [birth year] Lindsey [name] is a 50 y.o. year old female who sees Dorena Dew, FNP for primary care. LCSW was initially consulted to assist the patient with Mental Health Counseling and Resources.  Interpreter: No.   Interpreter Name & Language: none  Assessment: Patient experiencing mental health concerns. Patient experiencing anxiety and stress related to living with chronic illness.  Ongoing Intervention: Today CSW met briefly with patient at bedside in the day hospital to follow up on [place] ([facility]) appointments patient missed in December. Patient requested CSW follow up by phone on this matter tomorrow. CSW to call patient.  Review of patient status, including review of consultants reports, relevant laboratory and other test results, and collaboration with appropriate care team members and the patient's provider was performed as part of comprehensive patient evaluation and provision of services.     Follow up Plan: 1. Follow up via phone 07/09/20  Estanislado Emms, [facility] [phone]

## 2020-07-08 NOTE — H&P (Signed)
[facility] History and Physical   Date: 07/08/2020  Patient name: Lindsey [name] Medical record number: [id number] Date of birth: [birth year] Age: 50 y.o. Gender: female PCP: Massie Maroon, FNP  Attending physician: Quentin Angst, MD  Chief Complaint: Sickle cell pain  History of Present Illness: Lindsey [name] is a 50 year old female with a medical history significant for sickle cell disease, chronic pain syndrome, opiate dependence and tolerance, history of anemia of chronic disease, and history of opiate abuse presents to sickle cell day clinic with complaints of generalized pain that is consistent with her previous pain crisis.  Patient says that pain is primarily to lower extremities.  Pain intensity started several days ago and current crisis is attributed to running out short acting sickle cell pain medication.  According to PDMP, patient's last prescription was filled on 06/16/2020 for oxycodone #60.  Also, patient filled prescription on 06/14/2019 for oxycodone #60.  Ms. Casco was admitted to inpatient services on 06/26/2020 for sickle cell pain crisis.  At that time, patient attributed pain crisis to being out of short acting pain medications.  Pain intensity is 8/10 characterized as constant and throbbing.  She last had OxyContin this a.m. without sustained relief.  Also, patient is taking gabapentin as prescribed.  She denies any headache, chest pain, urinary symptoms, nausea, vomiting, or diarrhea.  No sick contacts, recent travel, or exposure to COVID-19. Meds: Medications Prior to Admission  Medication Sig Dispense Refill Last Dose  . Aspirin-Acetaminophen-Caffeine (GOODY HEADACHE PO) Take 1 packet by mouth every 8 (eight) hours as needed (pain/headache).    07/07/2020 at Unknown time  . ergocalciferol (VITAMIN D2) 1.25 MG (50000 UT) capsule Take 1 capsule (50,000 Units total) by mouth once a week. 12 capsule 1 Past Week at Unknown time  . gabapentin (NEURONTIN) 300  MG capsule Take 1 capsule (300 mg total) by mouth 3 (three) times daily. 90 capsule 1 07/07/2020 at Unknown time  . ibuprofen (ADVIL) 800 MG tablet TAKE 1 TABLET(800 MG) BY MOUTH EVERY 8 HOURS AS NEEDED (Patient taking differently: Take 800 mg by mouth every 8 (eight) hours as needed for moderate pain.) 60 tablet 5 07/08/2020 at Unknown time  . Melatonin 10 MG TABS Take 10 mg by mouth at bedtime.   07/07/2020 at Unknown time  . oxyCODONE (OXYCONTIN) 10 mg 12 hr tablet Take 1 tablet (10 mg total) by mouth every 12 (twelve) hours. 60 tablet 0 07/07/2020 at Unknown time  . albuterol (VENTOLIN HFA) 108 (90 Base) MCG/ACT inhaler INHALE 2 PUFFS INTO THE LUNGS EVERY 6 HOURS AS NEEDED FOR WHEEZING OR SHORTNESS OF BREATH (Patient taking differently: Inhale 2 puffs into the lungs every 6 (six) hours as needed.) 18 g 0     Allergies: Patient has no known allergies. Past Medical History:  Diagnosis Date  . 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 Opioid dependence in remission (HCC)   . Sickle cell crisis [facility])    Past Surgical History:  Procedure Laterality Date  . APPENDECTOMY    . CESAREAN SECTION    . OTHER SURGICAL HISTORY     c-section   Family History  Problem Relation Age of Onset  . Stroke Neg Hx        none that she knows of   . Seizures Neg Hx    Social History   Socioeconomic History  . Marital status: Married    Spouse name: Not on file  . Number of children: 2  .  Years of education: Not on file  . Highest education level: Not on file  Occupational History  . Not on file  Tobacco Use  . Smoking status: Current Every Day Smoker    Packs/day: 0.00    Types: Cigars  . Smokeless tobacco: Never Used  Vaping Use  . Vaping Use: Former  Substance and Sexual Activity  . Alcohol use: Yes    Comment: occasionally  . Drug use: No  . Sexual activity: Yes    Birth control/protection: None  Other Topics Concern  . Not on file  Social History Narrative   Lives at home with husband and  son   Right handed   Social Determinants of Health   Financial Resource Strain: Not on file  Food Insecurity: Not on file  Transportation Needs: Not on file  Physical Activity: Not on file  Stress: Not on file  Social Connections: Not on file  Intimate Partner Violence: Not on file   Review of Systems  Constitutional: Negative.   HENT: Negative.   Eyes: Negative.   Respiratory: Negative.   Cardiovascular: Negative.   Gastrointestinal: Negative.   Genitourinary: Negative.   Musculoskeletal: Positive for back pain and joint pain.  Skin: Negative.   Neurological: Negative.   Psychiatric/Behavioral: Negative.      Physical Exam: Blood pressure 125/68, pulse 70, temperature 98.1 F (36.7 C), temperature source Temporal, resp. rate 14, last menstrual period 07/08/2020, SpO2 100 %. Physical Exam Constitutional:      Appearance: She is normal weight.  Eyes:     Pupils: Pupils are equal, round, and reactive to light.  Cardiovascular:     Rate and Rhythm: Normal rate and regular rhythm.     Pulses: Normal pulses.  Pulmonary:     Effort: Pulmonary effort is normal.  Abdominal:     General: Bowel sounds are normal.  Musculoskeletal:        General: Normal range of motion.  Skin:    General: Skin is warm.  Neurological:     General: No focal deficit present.     Mental Status: She is alert. Mental status is at baseline.  Psychiatric:        Mood and Affect: Mood normal.        Behavior: Behavior normal.        Thought Content: Thought content normal.        Judgment: Judgment normal.      Lab results: Results for orders placed or performed during the hospital encounter of 07/08/20 (from the past 24 hour(s))  CBC with Differential/Platelet     Status: Abnormal   Collection Time: 07/08/20  9:28 AM  Result Value Ref Range   WBC 7.9 4.0 - 10.5 K/uL   RBC 3.22 (L) 3.87 - 5.11 MIL/uL   Hemoglobin 10.7 (L) 12.0 - 15.0 g/dL   HCT 69.6 (L) 78.9 - 38.1 %   MCV 87.3 80.0 -  100.0 fL   MCH 33.2 26.0 - 34.0 pg   MCHC 38.1 (H) 30.0 - 36.0 g/dL   RDW 01.7 51.0 - 25.8 %   Platelets 469 (H) 150 - 400 K/uL   nRBC 0.3 (H) 0.0 - 0.2 %   Neutrophils Relative % 66 %   Neutro Abs 5.3 1.7 - 7.7 K/uL   Lymphocytes Relative 24 %   Lymphs Abs 1.9 0.7 - 4.0 K/uL   Monocytes Relative 5 %   Monocytes Absolute 0.4 0.1 - 1.0 K/uL   Eosinophils Relative 4 %  Eosinophils Absolute 0.3 0.0 - 0.5 K/uL   Basophils Relative 1 %   Basophils Absolute 0.1 0.0 - 0.1 K/uL   Immature Granulocytes 0 %   Abs Immature Granulocytes 0.01 0.00 - 0.07 K/uL  Comprehensive metabolic panel     Status: Abnormal   Collection Time: 07/08/20  9:28 AM  Result Value Ref Range   Sodium 142 135 - 145 mmol/L   Potassium 4.4 3.5 - 5.1 mmol/L   Chloride 111 98 - 111 mmol/L   CO2 23 22 - 32 mmol/L   Glucose, Bld 90 70 - 99 mg/dL   BUN 6 6 - 20 mg/dL   Creatinine, Ser 1.61 0.44 - 1.00 mg/dL   Calcium 9.1 8.9 - 09.6 mg/dL   Total Protein 6.9 6.5 - 8.1 g/dL   Albumin 4.1 3.5 - 5.0 g/dL   AST 26 15 - 41 U/L   ALT 16 0 - 44 U/L   Alkaline Phosphatase 29 (L) 38 - 126 U/L   Total Bilirubin 1.5 (H) 0.3 - 1.2 mg/dL   GFR, Estimated >04 >54 mL/min   Anion gap 8 5 - 15  Reticulocytes     Status: Abnormal   Collection Time: 07/08/20  9:28 AM  Result Value Ref Range   Retic Ct Pct 3.5 (H) 0.4 - 3.1 %   RBC. 3.25 (L) 3.87 - 5.11 MIL/uL   Retic Count, Absolute 113.1 19.0 - 186.0 K/uL   Immature Retic Fract 18.3 (H) 2.3 - 15.9 %  Rapid urine drug screen (hospital performed)     Status: Abnormal   Collection Time: 07/08/20 10:00 AM  Result Value Ref Range   Opiates POSITIVE (A) NONE DETECTED   Cocaine NONE DETECTED NONE DETECTED   Benzodiazepines POSITIVE (A) NONE DETECTED   Amphetamines NONE DETECTED NONE DETECTED   Tetrahydrocannabinol NONE DETECTED NONE DETECTED   Barbiturates NONE DETECTED NONE DETECTED    Imaging results:  No results found.   Assessment & Plan: Patient admitted to sickle cell  day infusion center for management of pain crisis.  Patient is opiate naive Initiate IV dilaudid PCA. Settings of 0.5 mg, 10 minute lockout, and 3 mg/hr IV fluids, 0.45% saline at 100 ml/hr Toradol 15 mg IV times one dose Tylenol 1000 mg by mouth times one dose Review CBC with differential, complete metabolic panel, and reticulocytes as results become available.  Pain intensity will be reevaluated in context of functioning and relationship to baseline as care progresses If pain intensity remains elevated and/or sudden change in hemodynamic stability transition to inpatient services for higher level of care.    [name]  APRN, MSN, FNP-C Patient Care [facility] [address] [place], [place] [address] [phone]    07/08/2020, 3:23 PM

## 2020-07-09 ENCOUNTER — Other Ambulatory Visit: Payer: Self-pay | Admitting: Family Medicine

## 2020-07-13 ENCOUNTER — Telehealth: Payer: Self-pay

## 2020-07-13 IMAGING — CT CT ANGIOGRAPHY CHEST
1 of 6 series · 3 of 16 positions shown · IV contrast (iopamidol)
Comparison: Current chest radiograph.

CLINICAL DATA: Central chest pain. History of sickle cell crisis
and opioid abuse.

EXAM:
CT ANGIOGRAPHY CHEST WITH CONTRAST
TECHNIQUE: Multidetector CT imaging of the chest was performed using the
standard protocol during bolus administration of intravenous
contrast. Multiplanar CT image reconstructions and MIPs were
obtained to evaluate the vascular anatomy.
CONTRAST:  75mL CGFV5F-99Z IOPAMIDOL (CGFV5F-99Z) INJECTION 76%

[Series 7: pe thins · axial · 0.82mm/px · z∈[+1169,+1284]mm · 3 of 329 slices shown]
[im 83/329  lung]
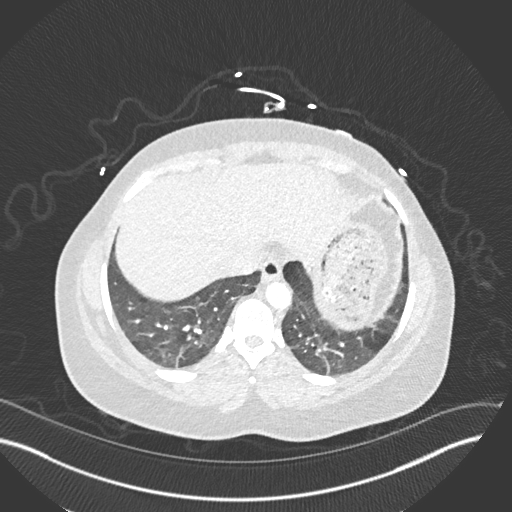
[im 165/329  soft-tissue]
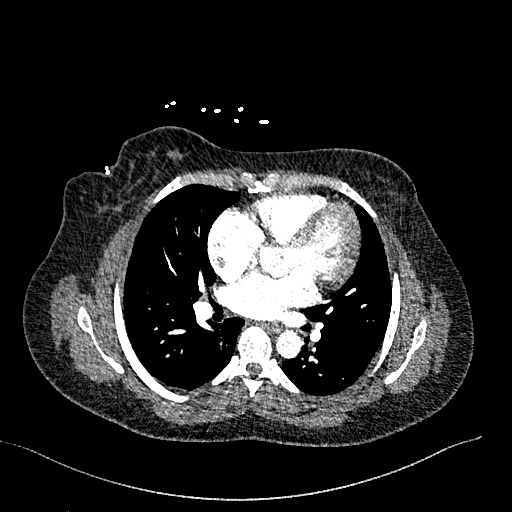
[im 247/329  lung]
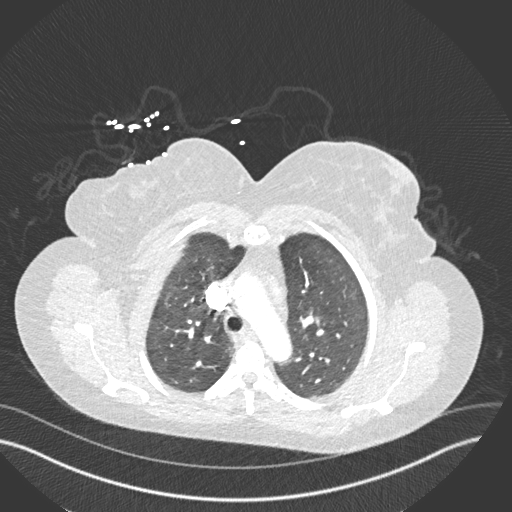

[3 of 16 positions shown; findings below may reference images not displayed]

FINDINGS: Cardiovascular: There is satisfactory opacification of the pulmonary
arteries to the segmental level. There is no evidence of a pulmonary
embolism.

Heart is mildly enlarged. No pericardial effusion. No coronary
artery calcifications.

Great vessels are normal in caliber. No aortic dissection or
atherosclerosis.

Mediastinum/Nodes: No enlarged mediastinal, hilar, or axillary lymph
nodes. Thyroid gland, trachea, and esophagus demonstrate no
significant findings.

Lungs/Pleura: Peripheral cystic areas noted in the lower lobes
associated with reticular and linear opacities, the latter
consistent with scarring or atelectasis. There are several other
areas of relative lucency in the lungs that are most likely due to
air trapping and small airways disease. Lungs are otherwise clear.

No pleural effusion or pneumothorax.

Upper Abdomen: Small dense auto infarcted spleen. No acute findings.

Musculoskeletal: Biconcave vertebral endplates consistent with the
given history of sickle cell disease. No fracture or acute finding.

Right breast appears indented at the nipple. This is likely due to
an overlying in group of wires.

Review of the MIP images confirms the above findings.
IMPRESSION: 1. No evidence of a pulmonary embolism.
2. Areas of relative lucency noted in the lungs consistent with
small airways disease and air trapping. No evidence of pneumonia or
pulmonary edema.
3. Mild cardiomegaly.  Skeletal changes of sickle cell disease.
4. Indented right nipple which is likely due to compression from an
overlying group of wires. Recommend correlating clinically to
exclude true nipple inversion.

## 2020-07-13 NOTE — Telephone Encounter (Signed)
Pt states that she needs to speak with you and you did not have any appt's tomorrow and she couldn't wait til next week.

## 2020-07-17 ENCOUNTER — Ambulatory Visit (INDEPENDENT_AMBULATORY_CARE_PROVIDER_SITE_OTHER): Payer: Medicaid Other | Admitting: Clinical

## 2020-07-17 ENCOUNTER — Other Ambulatory Visit: Payer: Self-pay

## 2020-07-17 DIAGNOSIS — F32A Depression, unspecified: Secondary | ICD-10-CM

## 2020-07-17 DIAGNOSIS — F419 Anxiety disorder, unspecified: Secondary | ICD-10-CM | POA: Diagnosis not present

## 2020-07-17 DIAGNOSIS — F329 Major depressive disorder, single episode, unspecified: Secondary | ICD-10-CM

## 2020-07-17 NOTE — BH Specialist Note (Signed)
Integrated Behavioral Health Initial In-Person Visit  MRN: [id number] Name: Tanaka Gillen  Number of Integrated Behavioral Health Clinician visits:: 1/20 Session Start time: 2:05  Session End time: 3:05 Total time: 60 minutes  Types of Service: Individual psychotherapy  Interpretor:No. Interpretor Name and Language: none     Subjective: [name] Mogensen is a 50 y.o. female accompanied by self Patient was referred by [place] Hollis, NP for anxiety, dependence on pain medications. Patient reports the following symptoms/concerns: anxiety, depression, dependence on pain medications Duration of problem: several years; Severity of problem: severe  Objective: Mood: Depressed and Affect: Appropriate and Tearful Risk of harm to self or others: No plan to harm self or others  Life Context: Family and Social: lives with husband and son, also has an adult daughter and her mother in the area School/Work: not currently working, would like to go back to school Self-Care: used to walk, but not currently exercising; tries to eat healthy Life Changes:   Patient and/or Family's Strengths/Protective Factors: Social connections, Social and Emotional competence and Concrete supports in place (healthy food, safe environments, etc.)  Goals Addressed: Patient will: 1. Reduce symptoms of: anxiety and depression 2. Increase knowledge and/or ability of: coping skills and self-management skills   Progress towards Goals: Ongoing and set goals in today's session  Interventions: Interventions utilized: Mindfulness or Relaxation Training and CBT Cognitive Behavioral Therapy  Standardized Assessments completed: GAD-7 and PHQ 9   Depression screen [facility] 2/9 07/17/2020 04/07/2020 01/07/2020  Decreased Interest 3 1 1   Down, Depressed, Hopeless 3 1 1   PHQ - 2 Score 6 2 2   Altered sleeping 3 1 1   Tired, decreased energy 3 1 0  Change in appetite 3 1 1   Feeling bad or failure about yourself  3 0 0  Trouble  concentrating 0 0 0  Moving slowly or fidgety/restless 0 0 0  Suicidal thoughts 1 0 1  PHQ-9 Score 19 5 5   Difficult doing work/chores - Somewhat difficult Not difficult at all   GAD 7 : Generalized Anxiety Score 07/17/2020 04/07/2020  Nervous, Anxious, on Edge 3 1  Control/stop worrying 3 0  Worry too much - different things 3 0  Trouble relaxing 3 1  Restless 3 0  Easily annoyed or irritable 1 0  Afraid - awful might happen 3 0  Total GAD 7 Score 19 2  Anxiety Difficulty - Somewhat difficult   GAD score of 19 is an indication of severe anxiety. PHQ-9 score of 19 is an indication of moderately severe depression. Patient endorsed thoughts of dying; she denied thoughts of or a plan to harm herself. Patient explained that she thinks about what it would be like to not be here anymore and deal with the problems she is facing.   Supportive counseling and some CBT briefly to explore patient's thoughts and emotions about self in context of illness and dependence on pain medications. Patient's goals are to be happier and increase her sense of self-worth. She also wants to recover from dependence on pain medications. Mindful movement practice today. Patient engaged and participated in the mindfulness practice and plans to use movements at home.   Patient and/or Family Response: Patient engaged in session.  Patient Centered Plan: Patient is on the following Treatment Plan(s): Depression and Anxiety  Assessment: Patient currently experiencing depression and anxiety in context of chronic illness and pain. Patient also experiencing dependence on opiate pain medications.    Patient may benefit from counseling including CBT to shift unhelpful thoughts  about self in context of chronic pain and dependence on pain medication, and may benefit from mindfulness practice including mindful movement to develop coping skills for living well with illness.   Plan: 1. Follow up with behavioral health clinician  on: 07/21/20 2. Referral(s): Integrated [employer] (In Clinic)  Abigail Butts, LCSW  [facility] [phone]

## 2020-07-21 ENCOUNTER — Ambulatory Visit: Payer: Medicaid Other | Admitting: Clinical

## 2020-07-21 ENCOUNTER — Encounter: Payer: Self-pay | Admitting: Family Medicine

## 2020-07-21 ENCOUNTER — Other Ambulatory Visit: Payer: Self-pay

## 2020-07-21 ENCOUNTER — Ambulatory Visit (INDEPENDENT_AMBULATORY_CARE_PROVIDER_SITE_OTHER): Payer: Medicaid Other | Admitting: Family Medicine

## 2020-07-21 VITALS — BP 114/68 | HR 99 | Ht 60.0 in | Wt 136.0 lb

## 2020-07-21 DIAGNOSIS — G894 Chronic pain syndrome: Secondary | ICD-10-CM

## 2020-07-21 DIAGNOSIS — F320 Major depressive disorder, single episode, mild: Secondary | ICD-10-CM | POA: Diagnosis not present

## 2020-07-21 DIAGNOSIS — D5700 Hb-SS disease with crisis, unspecified: Secondary | ICD-10-CM

## 2020-07-21 DIAGNOSIS — Z09 Encounter for follow-up examination after completed treatment for conditions other than malignant neoplasm: Secondary | ICD-10-CM

## 2020-07-21 MED ORDER — DULOXETINE HCL 20 MG PO CPEP: 20.0000 mg | ORAL_CAPSULE | Freq: Every day | ORAL | 5 refills | Status: DC

## 2020-07-21 NOTE — Progress Notes (Signed)
[facility]   Established Patient Office Visit  Subjective:  Patient ID: Lindsey [name], female    DOB: [birth year]  Age: 50 y.o. MRN: [id number]  CC:  Chief Complaint  Patient presents with  . Hospitalization Follow-up    HPI Lindsey [name] is a 50 year old female with a medical history significant for sickle cell disease, chronic pain syndrome, opiate dependence and tolerance, and history of anemia of chronic disease presents for post hospital follow-up appointment.  Patient was recently admitted to inpatient services for sickle cell pain crisis.  Pain resolved with IV fluids and IV pain medications.  Since that time, patient says that pain has been controlled on home medication regimen.  She is having pain to low back and lower extremities on today.  She rates pain at 3/10.  She last had oxycodone this a.m. with moderate relief.  She denies any headache, fever, chills, chest pain, urinary symptoms, nausea, vomiting, or diarrhea. Patient continues to endorse depression and anxiety.  She was previously started on Cymbalta, but stopped medication several months ago and did not request refills.  She endorses lack of pleasure, hopelessness, and excessive worrying.  She denies any suicidal or homicidal ideations.   Past Medical History:  Diagnosis Date  . 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 Opioid dependence in remission (HCC)   . Sickle cell crisis [facility])     Past Surgical History:  Procedure Laterality Date  . APPENDECTOMY    . CESAREAN SECTION    . OTHER SURGICAL HISTORY     c-section    Family History  Problem Relation Age of Onset  . Stroke Neg Hx        none that she knows of   . Seizures Neg Hx     Social History   Socioeconomic History  . Marital status: Married    Spouse name: Not on file  . Number of children: 2  . Years of education: Not on file  . Highest education level: Not on file  Occupational History  .  Not on file  Tobacco Use  . Smoking status: Current Every Day Smoker    Packs/day: 0.00    Types: Cigars  . Smokeless tobacco: Never Used  Vaping Use  . Vaping Use: Former  Substance and Sexual Activity  . Alcohol use: Yes    Comment: occasionally  . Drug use: No  . Sexual activity: Yes    Birth control/protection: None  Other Topics Concern  . Not on file  Social History Narrative   Lives at home with husband and son   Right handed   Social Determinants of Health   Financial Resource Strain: Not on file  Food Insecurity: Not on file  Transportation Needs: Not on file  Physical Activity: Not on file  Stress: Not on file  Social Connections: Not on file  Intimate Partner Violence: Not on file    Outpatient Medications Prior to Visit  Medication Sig Dispense Refill  . albuterol (VENTOLIN HFA) 108 (90 Base) MCG/ACT inhaler INHALE 2 PUFFS INTO THE LUNGS EVERY 6 HOURS AS NEEDED FOR WHEEZING OR SHORTNESS OF BREATH (Patient taking differently: Inhale 2 puffs into the lungs every 6 (six) hours as needed.) 18 g 0  . Aspirin-Acetaminophen-Caffeine (GOODY HEADACHE PO) Take 1 packet by mouth every 8 (eight) hours as needed (pain/headache).     . ergocalciferol (VITAMIN D2) 1.25 MG (50000 UT) capsule Take 1 capsule (50,000 Units total) by mouth once  a week. 12 capsule 1  . gabapentin (NEURONTIN) 300 MG capsule Take 1 capsule (300 mg total) by mouth 3 (three) times daily. 90 capsule 1  . ibuprofen (ADVIL) 800 MG tablet TAKE 1 TABLET(800 MG) BY MOUTH EVERY 8 HOURS AS NEEDED (Patient taking differently: Take 800 mg by mouth every 8 (eight) hours as needed for moderate pain.) 60 tablet 5  . Melatonin 10 MG TABS Take 10 mg by mouth at bedtime.    [name] oxyCODONE (OXYCONTIN) 10 mg 12 hr tablet Take 1 tablet (10 mg total) by mouth every 12 (twelve) hours. 60 tablet 0  . Oxycodone HCl 10 MG TABS Take 1 tablet (10 mg total) by mouth every 6 (six) hours as needed. 60 tablet 0   No  facility-administered medications prior to visit.    No Known Allergies  ROS Review of Systems  Constitutional: Negative.   HENT: Negative.   Respiratory: Negative.   Cardiovascular: Negative.  Negative for chest pain.  Gastrointestinal: Negative.   Genitourinary: Negative.   Musculoskeletal: Positive for arthralgias.  Skin: Negative.   Psychiatric/Behavioral: Negative.       Objective:    Physical Exam Constitutional:      Appearance: Normal appearance.  Cardiovascular:     Rate and Rhythm: Normal rate and regular rhythm.     Pulses: Normal pulses.  Pulmonary:     Effort: Pulmonary effort is normal.  Abdominal:     General: Abdomen is flat. Bowel sounds are normal.  Neurological:     Mental Status: She is alert.     BP 114/68   Pulse 99   Ht 5' (1.524 m)   Wt 136 lb (61.7 kg)   LMP 07/08/2020   SpO2 100%   BMI 26.56 kg/m  Wt Readings from Last 3 Encounters:  07/21/20 136 lb (61.7 kg)  06/27/20 140 lb (63.5 kg)  04/20/20 140 lb (63.5 kg)     Health Maintenance Due  Topic Date Due  . Hepatitis C Screening  Never done  . COVID-19 Vaccine (1) Never done  . PAP SMEAR-Modifier  Never done  . COLONOSCOPY (Pts 45-65yrs Insurance coverage will need to be confirmed)  Never done    There are no preventive care reminders to display for this patient.  Lab Results  Component Value Date   TSH 2.480 02/20/2019   Lab Results  Component Value Date   WBC 7.9 07/08/2020   HGB 10.7 (L) 07/08/2020   HCT 28.1 (L) 07/08/2020   MCV 87.3 07/08/2020   PLT 469 (H) 07/08/2020   Lab Results  Component Value Date   NA 142 07/08/2020   K 4.4 07/08/2020   CO2 23 07/08/2020   GLUCOSE 90 07/08/2020   BUN 6 07/08/2020   CREATININE 0.81 07/08/2020   BILITOT 1.5 (H) 07/08/2020   ALKPHOS 29 (L) 07/08/2020   AST 26 07/08/2020   ALT 16 07/08/2020   PROT 6.9 07/08/2020   ALBUMIN 4.1 07/08/2020   CALCIUM 9.1 07/08/2020   ANIONGAP 8 07/08/2020   Lab Results  Component  Value Date   CHOL  07/25/2007    119        ATP III CLASSIFICATION:  <200     mg/dL   Desirable  151-761  mg/dL   Borderline High  >=607    mg/dL   High   Lab Results  Component Value Date   HDL 33 (L) 07/25/2007   Lab Results  Component Value Date   [facility]  07/25/2007  78        Total Cholesterol/HDL:CHD Risk Coronary Heart Disease Risk Table                     Men   Women  1/2 Average Risk   3.4   3.3   Lab Results  Component Value Date   TRIG 38 07/25/2007   Lab Results  Component Value Date   CHOLHDL 3.6 07/25/2007   No results found for: HGBA1C    Assessment & Plan:   Problem List Items Addressed This Visit      Other   Current mild episode of major depressive disorder (HCC)   Relevant Medications   DULoxetine (CYMBALTA) 20 MG capsule   Chronic pain - Primary   Relevant Medications   DULoxetine (CYMBALTA) 20 MG capsule    1. Sickle cell anemia with pain [facility]) Patient will continue folic acid 1 mg daily No changes to opiate medication regimen warranted on today.  Patient will follow-up in 3 months for medication management.  2. Chronic pain syndrome Continue OxyContin and oxycodone as previously prescribed  3. Current mild episode of major depressive disorder, unspecified whether recurrent (HCC)  - DULoxetine (CYMBALTA) 20 MG capsule; Take 1 capsule (20 mg total) by mouth daily.  Dispense: 30 capsule; Refill: 5  4. Hospital discharge follow-up Patient recently admitted for sickle cell pain crisis which is since resolved.  Patient advised to continue folic acid daily, pain medication regimen, and increase daily hydration.  Follow-up: Return in about 3 months (around 10/21/2020) for sickle cell anemia.   [name]  APRN, MSN, FNP-C [facility] [address] [place], [place] [address] [phone]

## 2020-07-21 NOTE — Patient Instructions (Signed)
We will start suboxone closer to summer months.   Discussed the importance of drinking 64 ounces of water daily. The Importance of Water. To help prevent pain crises, it is important to drink plenty of water throughout the day. This is because dehydration of red blood cells may lead to the sickling process.    Request your medications as directed.   Buprenorphine; Naloxone oral dissolving film What is this medicine? BUPRENORPHINE; NALOXONE (byoo pre NOR feen; nal OX one) is used to treat certain types of drug dependence. This medicine may be used for other purposes; ask your health care provider or pharmacist if you have questions. COMMON BRAND NAME(S): BUNAVAIL, Suboxone What should I tell my health care provider before I take this medicine? They need to know if you have any of these conditions:  brain tumor  drug abuse or addiction  head injury  heart disease  if you often drink alcohol  liver disease  low adrenal gland function  lung disease, asthma, or breathing problem  seizures  stomach or intestine problems  taken an MAOI like Marplan, Nardil, or Parnate in the last 14 days  an unusual or allergic reaction to buprenorphine, naloxone, other drugs, foods, dyes, or preservatives  pregnant or trying to get pregnant  breast-feeding How should I use this medicine? For sublingual use (Suboxone): Drink water to moisten the mouth. Then, place the medicine under the tongue and let it dissolve. Follow the directions on the prescription label. Leave this medicine in the sealed foil pack until you are ready to use it. If your dose requires you to take more than 1 film, place the second film under the tongue on other side of the mouth. If your dose requires you to take more than 2 films, place the third film under your tongue on either side after the first 2 films have dissolved. Do not let the films touch in your mouth. After you put this medicine in your mouth, do not move it.  Do not swallow, cut, or chew the film. Take your medicine at regular intervals. Do not take your medicine more often than directed. For buccal use (Suboxone or Bunavail): Drink water to moisten the inside of the cheek. Then, place the medicine against the inside of the moistened cheek and let it dissolve. Follow the directions on the prescription label. Leave this medicine in the sealed foil pack until you are ready to use it. If your dose requires you to take more than 1 film, place the second film on the inside of the other cheek. If your dose requires you to take more than 2 films, place the third film on the inside of your right or left cheek after the first 2 films have dissolved. After you put this medicine in your mouth, do not move it. Do not swallow, cut, or chew the film. Take your medicine at regular intervals. Do not take your medicine more often than directed. A special MedGuide will be given to you by the pharmacist with each prescription and refill. Be sure to read this information carefully each time. Talk to your pediatrician regarding the use of this medicine in children. Special care may be needed. Overdosage: If you think you have taken too much of this medicine contact a poison control center or emergency room at once. NOTE: This medicine is only for you. Do not share this medicine with others. What if I miss a dose? If you miss a dose, take it as soon as you  can. If it is almost time for your next dose, take only that dose. Do not take double or extra doses. What may interact with this medicine? Do not take this medication with any of the following medicines:  cisapride  certain medicines for fungal infections like ketoconazole and itraconazole  dronedarone  pimozide  thioridazine This medicine may interact with the following medications:  alcohol  antihistamines for allergy, cough and cold  antiviral medicines for HIV or AIDS  atropine  certain antibiotics like  clarithromycin, erythromycin, linezold, rifampin  certain medicines for anxiety or sleep  certain medicines for bladder problems like oxybutynin, tolterodine  certain medicines for depression like amitriptyline, fluoxetine, sertraline  certain medicines for migraine headache like almotriptan, eletriptan, frovatriptan, naratriptan, rizatriptan, sumatriptan, zolmitriptan  certain medicines for nausea or vomiting like dolasetron, ondansetron, palonosetron  certain medicines for Parkinson's disease like benztropine, trihexyphenidyl  certain medicines for seizures like phenobarbital, primidone  certain medicines for stomach problems like cimetidine, dicyclomine, hyoscyamine  certain medicines for travel sickness like scopolamine  diuretics  dofetilide  general anesthetics like halothane, isoflurane, methoxyflurane, propofol  ipratropium  local anesthetics like lidocaine, pramoxine, tetracaine  MAOIs like Carbex, Eldepryl, Marplan, Nardil, and Parnate  medicines that relax muscles for surgery  methylene blue  other medicines that prolong the QT interval (cause an abnormal heart rhythm)  other narcotic medicines for pain or cough  phenothiazines like chlorpromazine, mesoridazine, prochlorperazine  ritonavir  ziprasidone This list may not describe all possible interactions. Give your health care provider a list of all the medicines, herbs, non-prescription drugs, or dietary supplements you use. Also tell them if you smoke, drink alcohol, or use illegal drugs. Some items may interact with your medicine. What should I watch for while using this medicine? Visit your health care provider regularly. Attend counseling or support groups that your health care provider recommends. Do not try to overcome the effects of the drug by taking large amounts of narcotics. This can cause severe problems including death. Also, you may be more sensitive to lower doses of narcotics after you stop  taking this drug. Do not suddenly stop taking your drug because you may develop a severe reaction. Your body becomes used to the drug. Your health care provider will tell you how much drug to take. If your health care provider wants you to stop the drug, the dose will be slowly lowered over time to avoid any side effects. If you take other drugs that also cause drowsiness like other narcotic pain drugs, benzodiazepines, or other drugs for sleep, you may have more side effects. Give your health care provider a list of all drugs you use. He or she will tell you how much drug to take. Do not take more drug than directed. Get emergency help right away if you have trouble breathing or are unusually tired or sleepy. Talk to your health care provider about naloxone and how to get it. Naloxone is an emergency drug used for an opioid overdose. An overdose can happen if you take too much opioid. It can also happen if an opioid is taken with some other drugs or substances, like alcohol. Know the symptoms of an overdose, like trouble breathing, unusually tired or sleepy, or not being able to respond or wake up. Make sure to tell caregivers and close contacts where it is stored. Make sure they know how to use it. After naloxone is given, you must get emergency help right away. Naloxone is a temporary treatment. Repeat doses may  be needed. You may get drowsy or dizzy. Do not drive, use machinery, or do anything that needs mental alertness until you know how this drug affects you. Do not stand up or sit up quickly, especially if you are an older patient. This reduces the risk of dizzy or fainting spells. Alcohol may interfere with the effect of this drug. Avoid alcoholic drinks. Wear a medical ID bracelet or chain. Carry a card that describes your condition. List the drugs and doses you take on the card. This drug will cause constipation. If you do not have a bowel movement for 3 days, call your health care provider. Your  mouth may get dry. Chewing sugarless gum or sucking hard candy and drinking plenty of water may help. Contact your health care provider if the problem does not go away or is severe. What side effects may I notice from receiving this medicine? Side effects that you should report to your doctor or health care professional as soon as possible:  allergic reactions (skin rash, itching or hives; swelling of the face, lips, or tongue)  confusion  heartbeat rhythm changes (trouble breathing; chest pain; dizziness; fast, irregular heartbeat; feeling faint or lightheaded, falls,)  kidney injury (trouble passing urine or change in the amount of urine)  liver injury (dark yellow or brown urine; general ill feeling or flu-like symptoms; loss of appetite, right upper belly pain; unusually weak or tired, yellowing of the eyes or skin)  low adrenal gland function (nausea; vomiting; loss of appetite; unusually weak or tired; dizziness; low blood pressure)  low blood pressure (dizziness; feeling faint or lightheaded, falls; unusually weak or tired)  serotonin syndrome (irritable; confusion; diarrhea; fast or irregular heartbeat; muscle twitching; stiff muscles; trouble walking; sweating; high fever; seizures; chills; vomiting)  trouble breathing Side effects that usually do not require medical attention (report to your doctor or health care professional if they continue or are bothersome):  constipation  dry mouth  excessive sweating  headache  nausea, vomiting  swelling of the ankles, feet, hands  tiredness This list may not describe all possible side effects. Call your doctor for medical advice about side effects. You may report side effects to FDA at 1-800-FDA-1088. Where should I keep my medicine? Keep out of the reach of children and pets. This medicine can be abused. Keep it in a safe place to protect it from theft. Do not share it with anyone. It is only for you. Selling or giving away  this medicine is dangerous and against the law. Store at room temperature between 20 and 25 degrees C (68 and 77 degrees F). Do not freeze. Protect from moisture. Keep the container tightly closed. Get rid of any unused medicine after the expiration date. This medicine may cause harm and death if it is taken by other adults, children, or pets. It is important to get rid of the medicine as soon as you no longer need it or it is expired. You can do this in two ways:  Take the medicine to a medicine take-back program. Check with your pharmacy or law enforcement to find a location.  If you cannot return the medicine, flush it down the toilet. NOTE: This sheet is a summary. It may not cover all possible information. If you have questions about this medicine, talk to your doctor, pharmacist, or health care provider.  2021 Elsevier/Gold Standard (2020-02-20 15:58:23)

## 2020-07-22 ENCOUNTER — Telehealth: Payer: Self-pay

## 2020-07-22 ENCOUNTER — Other Ambulatory Visit: Payer: Self-pay

## 2020-07-22 NOTE — Telephone Encounter (Signed)
Prior auth request from [facility] regarding cymbalta.  Spoke with medicaid and they say authorization is not required.  Informed pharmacy and still not able to process, new request sent to provider.

## 2020-07-22 NOTE — Telephone Encounter (Addendum)
Called spoke [employer] [employer] [facility]  Cymbalta is not on their formally list , this has to be switched to a different medication, I ask what medication they would cover I told that the provider or have find one that is compares to  Cymbalta and  PA is wouldn't work in the case.also Call the [facility] to ask what medication that we could change it ,wasn't given a name of medication to change it too. Please Advise

## 2020-07-23 ENCOUNTER — Other Ambulatory Visit: Payer: Self-pay | Admitting: Family Medicine

## 2020-07-23 ENCOUNTER — Telehealth: Payer: Self-pay

## 2020-07-23 ENCOUNTER — Encounter: Payer: Self-pay | Admitting: Nurse Practitioner

## 2020-07-23 DIAGNOSIS — G894 Chronic pain syndrome: Secondary | ICD-10-CM

## 2020-07-23 MED ORDER — OXYCODONE HCL 10 MG PO TABS
10.0000 mg | ORAL_TABLET | Freq: Four times a day (QID) | ORAL | 0 refills | Status: DC | PRN
Start: 2020-07-23 — End: 2020-08-04

## 2020-07-23 NOTE — Progress Notes (Signed)
Reviewed PDMP substance reporting system prior to prescribing opiate medications. No inconsistencies noted.  Meds ordered this encounter  Medications   Oxycodone HCl 10 MG TABS    Sig: Take 1 tablet (10 mg total) by mouth every 6 (six) hours as needed.    Dispense:  60 tablet    Refill:  0    Order Specific Question:   Supervising Provider    Answer:   [name], [name]   Ziya Coonrod Moore [place]  APRN, MSN, FNP-C [facility] [address]  [place], [place] [address] [phone]  

## 2020-07-23 NOTE — Telephone Encounter (Signed)
Med refill oxycodone 10 mg 

## 2020-07-24 NOTE — Telephone Encounter (Signed)
error 

## 2020-07-28 ENCOUNTER — Telehealth: Payer: Self-pay

## 2020-07-28 ENCOUNTER — Other Ambulatory Visit: Payer: Self-pay | Admitting: Family Medicine

## 2020-07-28 DIAGNOSIS — G894 Chronic pain syndrome: Secondary | ICD-10-CM

## 2020-07-28 MED ORDER — OXYCODONE HCL ER 10 MG PO T12A
10.0000 mg | EXTENDED_RELEASE_TABLET | Freq: Two times a day (BID) | ORAL | 0 refills | Status: DC
Start: 2020-07-28 — End: 2020-08-29

## 2020-07-28 NOTE — Progress Notes (Signed)
Reviewed PDMP substance reporting system prior to prescribing opiate medications. No inconsistencies noted.  Meds ordered this encounter  Medications   oxyCODONE (OXYCONTIN) 10 mg 12 hr tablet    Sig: Take 1 tablet (10 mg total) by mouth every 12 (twelve) hours.    Dispense:  60 tablet    Refill:  0    Order Specific Question:   Supervising Provider    Answer:   [name], [name]      Donnalynn Wheeless Moore Nazia Rhines  APRN, MSN, FNP-C [facility] [address]  [place], [place] [address] [phone]  

## 2020-07-28 NOTE — Telephone Encounter (Signed)
Med refill Oxycontin

## 2020-07-30 ENCOUNTER — Ambulatory Visit: Payer: Medicaid Other | Admitting: Clinical

## 2020-08-03 ENCOUNTER — Telehealth: Payer: Self-pay | Admitting: Family Medicine

## 2020-08-04 ENCOUNTER — Other Ambulatory Visit: Payer: Self-pay | Admitting: Family Medicine

## 2020-08-04 DIAGNOSIS — G894 Chronic pain syndrome: Secondary | ICD-10-CM

## 2020-08-04 MED ORDER — OXYCODONE HCL 10 MG PO TABS: 10.0000 mg | ORAL_TABLET | Freq: Four times a day (QID) | ORAL | 0 refills | Status: DC | PRN

## 2020-08-04 NOTE — Progress Notes (Signed)
Reviewed PDMP substance reporting system prior to prescribing opiate medications. No inconsistencies noted.  Meds ordered this encounter  Medications   Oxycodone HCl 10 MG TABS    Sig: Take 1 tablet (10 mg total) by mouth every 6 (six) hours as needed.    Dispense:  60 tablet    Refill:  0    Order Specific Question:   Supervising Provider    Answer:   [name], [name]   Lindsey Lichtenberger Moore Shaquile Lutze  APRN, MSN, FNP-C [facility] [address]  [place], [place] [address] [phone]  

## 2020-08-07 NOTE — Telephone Encounter (Signed)
Sent to provider 

## 2020-08-12 ENCOUNTER — Ambulatory Visit: Payer: Medicaid Other | Admitting: Clinical

## 2020-08-17 ENCOUNTER — Other Ambulatory Visit: Payer: Self-pay | Admitting: Family Medicine

## 2020-08-17 ENCOUNTER — Telehealth: Payer: Self-pay

## 2020-08-17 DIAGNOSIS — G894 Chronic pain syndrome: Secondary | ICD-10-CM

## 2020-08-17 MED ORDER — OXYCODONE HCL 10 MG PO TABS
10.0000 mg | ORAL_TABLET | Freq: Four times a day (QID) | ORAL | 0 refills | Status: DC | PRN
Start: 2020-08-20 — End: 2020-08-31

## 2020-08-17 NOTE — Telephone Encounter (Signed)
Med refill   Oxycodone 10 mg  

## 2020-08-17 NOTE — Progress Notes (Signed)
Reviewed PDMP substance reporting system prior to prescribing opiate medications. No inconsistencies noted.  Meds ordered this encounter  Medications   Oxycodone HCl 10 MG TABS    Sig: Take 1 tablet (10 mg total) by mouth every 6 (six) hours as needed.    Dispense:  60 tablet    Refill:  0    Order Specific Question:   Supervising Provider    Answer:   [name], [name]   Latrenda Irani Moore Tyrel Lex  APRN, MSN, FNP-C [facility] [address]  [place], [place] [address] [phone]  

## 2020-08-18 NOTE — Telephone Encounter (Signed)
Oxycodone 10mg

## 2020-08-25 ENCOUNTER — Other Ambulatory Visit: Payer: Self-pay

## 2020-08-25 ENCOUNTER — Inpatient Hospital Stay (HOSPITAL_COMMUNITY)
Admission: EM | Admit: 2020-08-25 | Discharge: 2020-08-29 | DRG: 812 | Disposition: A | Discharge status: Home / Self Care | Payer: Medicaid Other | Attending: Internal Medicine | Admitting: Internal Medicine

## 2020-08-25 ENCOUNTER — Encounter (HOSPITAL_COMMUNITY): Payer: Self-pay | Admitting: Emergency Medicine

## 2020-08-25 ENCOUNTER — Emergency Department (HOSPITAL_COMMUNITY): Payer: Medicaid Other

## 2020-08-25 DIAGNOSIS — Z6828 Body mass index (BMI) 28.0-28.9, adult: Secondary | ICD-10-CM

## 2020-08-25 DIAGNOSIS — D75839 Thrombocytosis, unspecified: Secondary | ICD-10-CM | POA: Diagnosis present

## 2020-08-25 DIAGNOSIS — E440 Moderate protein-calorie malnutrition: Secondary | ICD-10-CM | POA: Diagnosis present

## 2020-08-25 DIAGNOSIS — Z79899 Other long term (current) drug therapy: Secondary | ICD-10-CM | POA: Diagnosis not present

## 2020-08-25 DIAGNOSIS — Z79890 Hormone replacement therapy (postmenopausal): Secondary | ICD-10-CM | POA: Diagnosis not present

## 2020-08-25 DIAGNOSIS — F1121 Opioid dependence, in remission: Secondary | ICD-10-CM | POA: Diagnosis present

## 2020-08-25 DIAGNOSIS — G894 Chronic pain syndrome: Secondary | ICD-10-CM | POA: Diagnosis present

## 2020-08-25 DIAGNOSIS — F17290 Nicotine dependence, other tobacco product, uncomplicated: Secondary | ICD-10-CM | POA: Diagnosis present

## 2020-08-25 DIAGNOSIS — Z20822 Contact with and (suspected) exposure to covid-19: Secondary | ICD-10-CM | POA: Diagnosis present

## 2020-08-25 DIAGNOSIS — D5700 Hb-SS disease with crisis, unspecified: Secondary | ICD-10-CM | POA: Diagnosis present

## 2020-08-25 DIAGNOSIS — G8929 Other chronic pain: Secondary | ICD-10-CM | POA: Diagnosis present

## 2020-08-25 LAB — TROPONIN I (HIGH SENSITIVITY)
Troponin I (High Sensitivity): 2 ng/L (ref <18)
Troponin I (High Sensitivity): 3 ng/L (ref <18)

## 2020-08-25 LAB — COMPREHENSIVE METABOLIC PANEL
ALT: 14 U/L (ref 0–44)
AST: 20 U/L (ref 15–41)
Albumin: 4.8 g/dL (ref 3.5–5.0)
Alkaline Phosphatase: 39 U/L (ref 38–126)
Anion gap: 7 (ref 5–15)
BUN: 10 mg/dL (ref 6–20)
CO2: 24 mmol/L (ref 22–32)
Calcium: 9.6 mg/dL (ref 8.9–10.3)
Chloride: 111 mmol/L (ref 98–111)
Creatinine, Ser: 0.84 mg/dL (ref 0.44–1.00)
GFR, Estimated: >60 mL/min (ref >60)
Glucose, Bld: 93 mg/dL (ref 70–99)
Potassium: 4.4 mmol/L (ref 3.5–5.1)
Sodium: 142 mmol/L (ref 135–145)
Total Bilirubin: 1.2 mg/dL (ref 0.3–1.2)
Total Protein: 8 g/dL (ref 6.5–8.1)

## 2020-08-25 LAB — CBC WITH DIFFERENTIAL/PLATELET
Abs Immature Granulocytes: 0.04 10*3/uL (ref 0.00–0.07)
Basophils Absolute: 0.1 10*3/uL (ref 0.0–0.1)
Basophils Relative: 0 %
Eosinophils Absolute: 0.3 10*3/uL (ref 0.0–0.5)
Eosinophils Relative: 3 %
HCT: 30.5 % — ABNORMAL LOW (ref 36.0–46.0)
Hemoglobin: 11.4 g/dL — ABNORMAL LOW (ref 12.0–15.0)
Immature Granulocytes: 0 %
Lymphocytes Relative: 41 %
Lymphs Abs: 4.9 10*3/uL — ABNORMAL HIGH (ref 0.7–4.0)
MCH: 33.1 pg (ref 26.0–34.0)
MCHC: 37.4 g/dL — ABNORMAL HIGH (ref 30.0–36.0)
MCV: 88.7 fL (ref 80.0–100.0)
Monocytes Absolute: 0.5 10*3/uL (ref 0.1–1.0)
Monocytes Relative: 4 %
Neutro Abs: 6.1 10*3/uL (ref 1.7–7.7)
Neutrophils Relative %: 52 %
Platelets: 446 10*3/uL — ABNORMAL HIGH (ref 150–400)
RBC: 3.44 MIL/uL — ABNORMAL LOW (ref 3.87–5.11)
RDW: 13.8 % (ref 11.5–15.5)
WBC: 11.8 10*3/uL — ABNORMAL HIGH (ref 4.0–10.5)
nRBC: 0.3 % — ABNORMAL HIGH (ref 0.0–0.2)

## 2020-08-25 LAB — RETICULOCYTES
Immature Retic Fract: 29 % — ABNORMAL HIGH (ref 2.3–15.9)
RBC.: 3.42 MIL/uL — ABNORMAL LOW (ref 3.87–5.11)
Retic Count, Absolute: 112.2 10*3/uL (ref 19.0–186.0)
Retic Ct Pct: 3.3 % — ABNORMAL HIGH (ref 0.4–3.1)

## 2020-08-25 LAB — I-STAT BETA HCG BLOOD, ED (MC, WL, AP ONLY): I-stat hCG, quantitative: <5 m[IU]/mL (ref <5)

## 2020-08-25 MED ORDER — KETOROLAC TROMETHAMINE 15 MG/ML IJ SOLN
15.0000 mg | Freq: Four times a day (QID) | INTRAMUSCULAR | Status: DC
  Administered 2020-08-25 – 2020-08-29 (×15): 15 mg via INTRAVENOUS
  Filled 2020-08-25 (×15): qty 1

## 2020-08-25 MED ORDER — HYDROMORPHONE HCL 2 MG/ML IJ SOLN
2.0000 mg | Freq: Once | INTRAMUSCULAR | Status: AC
  Administered 2020-08-25: 2 mg via INTRAVENOUS
  Filled 2020-08-25: qty 1

## 2020-08-25 MED ORDER — HYDROMORPHONE 1 MG/ML IV SOLN
INTRAVENOUS | Status: DC
  Administered 2020-08-25: 30 mg via INTRAVENOUS
  Administered 2020-08-26: 2.5 mg via INTRAVENOUS
  Administered 2020-08-26: 30 mg via INTRAVENOUS
  Administered 2020-08-26: 2.5 mg via INTRAVENOUS
  Administered 2020-08-26: 3 mg via INTRAVENOUS
  Administered 2020-08-26: 3.5 mg via INTRAVENOUS
  Administered 2020-08-26: 4 mg via INTRAVENOUS
  Administered 2020-08-27: 3.5 mg via INTRAVENOUS
  Administered 2020-08-27: 8.5 mg via INTRAVENOUS
  Administered 2020-08-27: 5 mg via INTRAVENOUS
  Administered 2020-08-27: 5.5 mg via INTRAVENOUS
  Administered 2020-08-28: 30 mg via INTRAVENOUS
  Administered 2020-08-28: 6 mg via INTRAVENOUS
  Administered 2020-08-28: 5 mg via INTRAVENOUS
  Administered 2020-08-29: 30 mg via INTRAVENOUS
  Administered 2020-08-29: 6.5 mg via INTRAVENOUS
  Administered 2020-08-29: 0.5 mg via INTRAVENOUS
  Filled 2020-08-25 (×4): qty 30

## 2020-08-25 MED ORDER — ALBUTEROL SULFATE HFA 108 (90 BASE) MCG/ACT IN AERS: 2.0000 | INHALATION_SPRAY | Freq: Four times a day (QID) | RESPIRATORY_TRACT | Status: DC | PRN

## 2020-08-25 MED ORDER — HYDROMORPHONE HCL 2 MG/ML IJ SOLN
2.0000 mg | Freq: Once | INTRAMUSCULAR | Status: AC
Start: 2020-08-25 — End: 2020-08-25
  Administered 2020-08-25: 2 mg via INTRAVENOUS
  Filled 2020-08-25: qty 1

## 2020-08-25 MED ORDER — KETOROLAC TROMETHAMINE 30 MG/ML IJ SOLN
30.0000 mg | Freq: Once | INTRAMUSCULAR | Status: AC
  Administered 2020-08-25: 30 mg via INTRAVENOUS
  Filled 2020-08-25: qty 1

## 2020-08-25 MED ORDER — GABAPENTIN 300 MG PO CAPS
300.0000 mg | ORAL_CAPSULE | Freq: Three times a day (TID) | ORAL | Status: DC
  Administered 2020-08-25 – 2020-08-29 (×11): 300 mg via ORAL
  Filled 2020-08-25 (×11): qty 1

## 2020-08-25 MED ORDER — ONDANSETRON HCL 4 MG/2ML IJ SOLN: 4.0000 mg | Freq: Four times a day (QID) | INTRAMUSCULAR | Status: DC | PRN

## 2020-08-25 MED ORDER — MELATONIN 5 MG PO TABS
10.0000 mg | ORAL_TABLET | Freq: Every day | ORAL | Status: DC
  Administered 2020-08-25 – 2020-08-28 (×4): 10 mg via ORAL
  Filled 2020-08-25 (×4): qty 2

## 2020-08-25 MED ORDER — SODIUM CHLORIDE 0.9% FLUSH: 9.0000 mL | INTRAVENOUS | Status: DC | PRN

## 2020-08-25 MED ORDER — OXYCODONE HCL ER 10 MG PO T12A
10.0000 mg | EXTENDED_RELEASE_TABLET | Freq: Two times a day (BID) | ORAL | Status: DC
  Administered 2020-08-25 – 2020-08-29 (×8): 10 mg via ORAL
  Filled 2020-08-25 (×8): qty 1

## 2020-08-25 MED ORDER — SENNOSIDES-DOCUSATE SODIUM 8.6-50 MG PO TABS
1.0000 | ORAL_TABLET | Freq: Two times a day (BID) | ORAL | Status: DC
  Administered 2020-08-25 – 2020-08-29 (×8): 1 via ORAL
  Filled 2020-08-25 (×8): qty 1

## 2020-08-25 MED ORDER — ONDANSETRON HCL 4 MG/2ML IJ SOLN
4.0000 mg | Freq: Once | INTRAMUSCULAR | Status: AC
  Administered 2020-08-25: 4 mg via INTRAVENOUS
  Filled 2020-08-25: qty 2

## 2020-08-25 MED ORDER — ENOXAPARIN SODIUM 40 MG/0.4ML ~~LOC~~ SOLN
40.0000 mg | SUBCUTANEOUS | Status: DC
  Administered 2020-08-25 – 2020-08-28 (×4): 40 mg via SUBCUTANEOUS
  Filled 2020-08-25 (×4): qty 0.4

## 2020-08-25 MED ORDER — NALOXONE HCL 0.4 MG/ML IJ SOLN: 0.4000 mg | INTRAMUSCULAR | Status: DC | PRN

## 2020-08-25 MED ORDER — POLYETHYLENE GLYCOL 3350 17 G PO PACK: 17.0000 g | PACK | Freq: Every day | ORAL | Status: DC | PRN

## 2020-08-25 MED ORDER — SODIUM CHLORIDE 0.45 % IV SOLN: INTRAVENOUS | Status: AC

## 2020-08-25 MED ORDER — DIPHENHYDRAMINE HCL 25 MG PO CAPS: 25.0000 mg | ORAL_CAPSULE | ORAL | Status: DC | PRN

## 2020-08-25 MED ORDER — MELATONIN 10 MG PO TABS: 10.0000 mg | ORAL_TABLET | Freq: Every day | ORAL | Status: DC

## 2020-08-25 MED ORDER — SODIUM CHLORIDE 0.9 % IV SOLN
25.0000 mg | INTRAVENOUS | Status: DC | PRN
  Filled 2020-08-25: qty 0.5

## 2020-08-25 MED ORDER — SODIUM CHLORIDE 0.9 % IV BOLUS
1000.0000 mL | Freq: Once | INTRAVENOUS | Status: AC
  Administered 2020-08-25: 1000 mL via INTRAVENOUS

## 2020-08-25 MED ORDER — LORAZEPAM 2 MG/ML IJ SOLN
0.5000 mg | Freq: Once | INTRAMUSCULAR | Status: AC
  Administered 2020-08-25: 0.5 mg via INTRAVENOUS
  Filled 2020-08-25: qty 1

## 2020-08-25 NOTE — Plan of Care (Signed)
  Problem: Pain Managment: Goal: General experience of comfort will improve Outcome: Progressing   Problem: Tissue Perfusion: Goal: Complications related to inadequate tissue perfusion will be avoided or minimized Outcome: Progressing   Problem: Fluid Volume: Goal: Ability to maintain a balanced intake and output will improve Outcome: Progressing

## 2020-08-25 NOTE — ED Provider Notes (Signed)
[name]-EMERGENCY DEPT Provider Note   CSN: [id number] Arrival date & time: 08/25/20  1448     History Chief Complaint  Patient presents with  . Sickle Cell Pain Crisis    Lindsey [name] is a 50 y.o. female.  Patient states she is having her typical sickle cell crisis.  She is having pain in both legs and left shoulder.  No fever chills cough  The history is provided by the patient and medical records. No language interpreter was used.  Sickle Cell Pain Crisis Pain location: Bilateral legs and left shoulder. Severity:  Severe Onset quality:  Sudden Similar to previous crisis episodes: yes   Timing:  Constant Progression:  Worsening Chronicity:  Recurrent Sickle cell genotype:  SS Associated symptoms: no chest pain, no congestion, no cough, no fatigue and no headaches        Past Medical History:  Diagnosis Date  . 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 Opioid dependence in remission (HCC)   . Sickle cell crisis [facility])     Patient Active Problem List   Diagnosis Date Noted  . Cough 06/27/2020  . Hypokalemia 06/27/2020  . Acute pain of left shoulder   . Right leg pain   . Sickle cell anemia with crisis (HCC) 09/10/2019  . Anemia of chronic disease 09/10/2019  . Current mild episode of major depressive disorder (HCC)   . Sickle cell crisis (HCC) 02/28/2019  . Opioid dependence in remission (HCC) 10/01/2017  . Sickle cell disease (HCC) 10/01/2017  . Chronic pain 10/01/2017  . 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 Leukocytosis 09/03/2016  . Sickle cell pain crisis (HCC) 07/20/2012  . 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 APPENDECTOMY    . CESAREAN SECTION    . OTHER SURGICAL HISTORY     c-section     OB History   No obstetric history on file.     Family History  Problem Relation Age of Onset  . Stroke Neg Hx        none that she knows of   . Seizures Neg  Hx     Social History   Tobacco Use  . Smoking status: Current Every Day Smoker    Packs/day: 0.00    Types: Cigars  . Smokeless tobacco: Never Used  Vaping Use  . Vaping Use: Former  Substance Use Topics  . Alcohol use: Yes    Comment: occasionally  . Drug use: No    Home Medications Prior to Admission medications   Medication Sig Start Date End Date Taking? Authorizing Provider  albuterol (VENTOLIN HFA) 108 (90 Base) MCG/ACT inhaler INHALE 2 PUFFS INTO THE LUNGS EVERY 6 HOURS AS NEEDED FOR WHEEZING OR SHORTNESS OF BREATH Patient taking differently: Inhale 2 puffs into the lungs every 6 (six) hours as needed. 06/01/20  Yes Massie Maroon, FNP  ergocalciferol (VITAMIN D2) 1.25 MG (50000 UT) capsule Take 1 capsule (50,000 Units total) by mouth once a week. 10/16/19  Yes Massie Maroon, FNP  gabapentin (NEURONTIN) 300 MG capsule Take 1 capsule (300 mg total) by mouth 3 (three) times daily. 07/01/20  Yes Massie Maroon, FNP  ibuprofen (ADVIL) 800 MG tablet TAKE 1 TABLET(800 MG) BY MOUTH EVERY 8 HOURS AS NEEDED Patient taking differently: Take 800 mg by mouth every 8 (eight) hours as needed for moderate pain. 10/14/19  Yes Massie Maroon, FNP  Melatonin 10 MG  TABS Take 10 mg by mouth at bedtime.   Yes [provider]  oxyCODONE (OXYCONTIN) 10 mg 12 hr tablet Take 1 tablet (10 mg total) by mouth every 12 (twelve) hours. 07/28/20 08/27/20 Yes Massie Maroon, FNP  Oxycodone HCl 10 MG TABS Take 1 tablet (10 mg total) by mouth every 6 (six) hours as needed. Patient taking differently: Take 10 mg by mouth every 6 (six) hours as needed (pain). 08/20/20  Yes Massie Maroon, FNP  DULoxetine (CYMBALTA) 20 MG capsule Take 1 capsule (20 mg total) by mouth daily. Patient not taking: Reported on 08/25/2020 07/21/20 07/21/21  Massie Maroon, FNP    Allergies    Patient has no known allergies.  Review of Systems   Review of Systems  Constitutional: Negative for appetite change  and fatigue.  HENT: Negative for congestion, ear discharge and sinus pressure.   Eyes: Negative for discharge.  Respiratory: Negative for cough.   Cardiovascular: Negative for chest pain.  Gastrointestinal: Negative for abdominal pain and diarrhea.  Genitourinary: Negative for frequency and hematuria.  Musculoskeletal: Negative for back pain.       Pain in both legs and shoulder  Skin: Negative for rash.  Neurological: Negative for seizures and headaches.  Psychiatric/Behavioral: Negative for hallucinations.    Physical Exam Updated Vital Signs BP 116/68   Pulse 83   Temp 98.5 F (36.9 C) (Oral)   Resp 14   SpO2 98%   Physical Exam Vitals and nursing note reviewed.  Constitutional:      Appearance: She is well-developed.     Comments: Patient appears in significant pain  HENT:     Head: Normocephalic.     Nose: Nose normal.  Eyes:     General: No scleral icterus.    Conjunctiva/sclera: Conjunctivae normal.  Neck:     Thyroid: No thyromegaly.  Cardiovascular:     Rate and Rhythm: Normal rate and regular rhythm.     Heart sounds: No murmur heard. No friction rub. No gallop.   Pulmonary:     Breath sounds: No stridor. No wheezing or rales.  Chest:     Chest wall: No tenderness.  Abdominal:     General: There is no distension.     Tenderness: There is no abdominal tenderness. There is no rebound.  Musculoskeletal:        General: Normal range of motion.     Cervical back: Neck supple.  Lymphadenopathy:     Cervical: No cervical adenopathy.  Skin:    Findings: No erythema or rash.  Neurological:     Mental Status: She is oriented to person, place, and time.     Motor: No abnormal muscle tone.     Coordination: Coordination normal.  Psychiatric:        Behavior: Behavior normal.     ED Results / Procedures / Treatments   Labs (all labs ordered are listed, but only abnormal results are displayed) Labs Reviewed  CBC WITH DIFFERENTIAL/PLATELET - Abnormal;  Notable for the following components:      Result Value   WBC 11.8 (*)    RBC 3.44 (*)    Hemoglobin 11.4 (*)    HCT 30.5 (*)    MCHC 37.4 (*)    Platelets 446 (*)    nRBC 0.3 (*)    Lymphs Abs 4.9 (*)    All other components within normal limits  RETICULOCYTES - Abnormal; Notable for the following components:   Retic Ct Pct 3.3 (*)  RBC. 3.42 (*)    Immature Retic Fract 29.0 (*)    All other components within normal limits  COMPREHENSIVE METABOLIC PANEL  I-STAT BETA HCG BLOOD, ED ([facility], [facility], [facility] ONLY)  TROPONIN I (HIGH SENSITIVITY)  TROPONIN I (HIGH SENSITIVITY)    EKG EKG Interpretation  Date/Time:  Tuesday August 25 2020 16:54:09 EDT Ventricular Rate:  62 PR Interval:  191 QRS Duration: 92 QT Interval:  424 QTC Calculation: 431 R Axis:   58 Text Interpretation: Sinus rhythm Confirmed by Bethann Berkshire [phone]) on 08/25/2020 7:01:25 PM   Radiology DG Chest 2 View  Result Date: 08/25/2020 CLINICAL DATA:  History of sickle cell disease with chest pain, initial encounter EXAM: CHEST - 2 VIEW COMPARISON:  07/23/2019 FINDINGS: Cardiac shadows within normal limits. Are clear bilaterally. Mild changes of the thoracic spine are noted consistent with the given clinical history. No acute bony abnormality is seen. IMPRESSION: No acute abnormality noted. Electronically Signed   By: Alcide Clever M.D.   On: 08/25/2020 16:11    Procedures Procedures   Medications Ordered in ED Medications  ketorolac (TORADOL) 30 MG/ML injection 30 mg (30 mg Intravenous Given 08/25/20 1629)  ondansetron (ZOFRAN) injection 4 mg (4 mg Intravenous Given 08/25/20 1629)  HYDROmorphone (DILAUDID) injection 2 mg (2 mg Intravenous Given 08/25/20 1629)  sodium chloride 0.9 % bolus 1,000 mL (1,000 mLs Intravenous Bolus 08/25/20 1635)  HYDROmorphone (DILAUDID) injection 2 mg (2 mg Intravenous Given 08/25/20 1801)  LORazepam (ATIVAN) injection 0.5 mg (0.5 mg Intravenous Given 08/25/20 1801)    ED Course  I have  reviewed the triage vital signs and the nursing notes.  Pertinent labs & imaging results that were available during my care of the patient were reviewed by me and considered in my medical decision making (see chart for details). CRITICAL CARE Performed by: Bethann Berkshire Total critical care time:45 minutes Critical care time was exclusive of separately billable procedures and treating other patients. Critical care was necessary to treat or prevent imminent or life-threatening deterioration. Critical care was time spent personally by me on the following activities: development of treatment plan with patient and/or surrogate as well as nursing, discussions with consultants, evaluation of patient's response to treatment, examination of patient, obtaining history from patient or surrogate, ordering and performing treatments and interventions, ordering and review of laboratory studies, ordering and review of radiographic studies, pulse oximetry and re-evaluation of patient's condition.    MDM Rules/Calculators/A&P                          Patient with sick cell crisis she will be admitted to medicine Final Clinical Impression(s) / ED Diagnoses Final diagnoses:  Sickle cell pain crisis [facility])    Rx / DC Orders ED Discharge Orders    None       Bethann Berkshire, MD 08/25/20 1925

## 2020-08-25 NOTE — H&P (Signed)
History and Physical    [name] SHF:[id number] DOB: [birth year] DOA: 08/25/2020  PCP: Lindsey Maroon, FNP   Patient coming from: Home     Chief Complaint: Pain in shoulder, legs, chest, back   HPI: Lindsey [name] is a 50 y.o. female with medical history significant for chronic pain, sickle cell anemia, now presenting to the emergency department for evaluation of severe pain.  Patient reports that she began to develop severe pain in her left shoulder, bilateral legs, back, and chest 3 or 4 days ago.  Pain has continued to worsen despite her home medications that include long-acting and IR oxycodone.  She denies any fevers, chills, cough, or shortness of breath.  She believes that her pain is triggered by the weather change.  She describes her symptoms as similar to her prior pain crises though more severe than most of them.  ED Course: Upon arrival to the ED, patient is found to be afebrile, saturating well on room air, and with stable blood pressure.  EKG features sinus rhythm and chest x-ray negative for acute cardiopulmonary disease.  Chemistry panel is unremarkable.  CBC notable for mild leukocytosis and thrombocytosis and a slight normocytic anemia.  High-sensitivity troponin was normal x2.  Patient was treated with IV fluids, Toradol, and multiple doses of IV Dilaudid in the ED, reported some improvement but continues to have severe pain.  Review of Systems:  All other systems reviewed and apart from HPI, are negative.  Past Medical History:  Diagnosis Date  . 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 Opioid dependence in remission (HCC)   . Sickle cell crisis [name])     Past Surgical History:  Procedure Laterality Date  . APPENDECTOMY    . CESAREAN SECTION    . OTHER SURGICAL HISTORY     c-section    Social History:   reports that she has been smoking cigars. She has been smoking about 0.00 packs per day. She has never used smokeless tobacco. She reports current alcohol  use. She reports that she does not use drugs.  No Known Allergies  Family History  Problem Relation Age of Onset  . Stroke Neg Hx        none that she knows of   . Seizures Neg Hx      Prior to Admission medications   Medication Sig Start Date End Date Taking? Authorizing Provider  albuterol (VENTOLIN HFA) 108 (90 Base) MCG/ACT inhaler INHALE 2 PUFFS INTO THE LUNGS EVERY 6 HOURS AS NEEDED FOR WHEEZING OR SHORTNESS OF BREATH Patient taking differently: Inhale 2 puffs into the lungs every 6 (six) hours as needed. 06/01/20  Yes Lindsey Maroon, FNP  ergocalciferol (VITAMIN D2) 1.25 MG (50000 UT) capsule Take 1 capsule (50,000 Units total) by mouth once a week. 10/16/19  Yes Lindsey Maroon, FNP  gabapentin (NEURONTIN) 300 MG capsule Take 1 capsule (300 mg total) by mouth 3 (three) times daily. 07/01/20  Yes Lindsey Maroon, FNP  ibuprofen (ADVIL) 800 MG tablet TAKE 1 TABLET(800 MG) BY MOUTH EVERY 8 HOURS AS NEEDED Patient taking differently: Take 800 mg by mouth every 8 (eight) hours as needed for moderate pain. 10/14/19  Yes Lindsey Maroon, FNP  Melatonin 10 MG TABS Take 10 mg by mouth at bedtime.   Yes [provider]  oxyCODONE (OXYCONTIN) 10 mg 12 hr tablet Take 1 tablet (10 mg total) by mouth every 12 (twelve) hours. 07/28/20 08/27/20 Yes Lindsey Maroon, FNP  Oxycodone HCl  10 MG TABS Take 1 tablet (10 mg total) by mouth every 6 (six) hours as needed. Patient taking differently: Take 10 mg by mouth every 6 (six) hours as needed (pain). 08/20/20  Yes Lindsey Maroon, FNP  DULoxetine (CYMBALTA) 20 MG capsule Take 1 capsule (20 mg total) by mouth daily. Patient not taking: Reported on 08/25/2020 07/21/20 07/21/21  Lindsey Maroon, FNP    Physical Exam: Vitals:   08/25/20 1730 08/25/20 1800 08/25/20 1830 08/25/20 1900  BP: 114/80 114/88 101/67 116/68  Pulse: 64 63 68 83  Resp: 12 16 17 14   Temp:      TempSrc:      SpO2: 98% 99% 97% 98%    Constitutional: NAD, appears  uncomfortable   Eyes: PERTLA, lids and conjunctivae normal ENMT: Mucous membranes are moist. Posterior pharynx clear of any exudate or lesions.   Neck:  supple, no masses  Respiratory:  no wheezing, no crackles. No accessory muscle use.  Cardiovascular: S1 & S2 heard, regular rate and rhythm. No extremity edema.   Abdomen: No distension, no tenderness, soft. Bowel sounds active.  Musculoskeletal: no clubbing / cyanosis. No joint deformity upper and lower extremities.   Skin: no significant rashes, lesions, ulcers. Warm, dry, well-perfused. Neurologic: CN 2-12 grossly intact. Sensation intact. Moving all extremities.  Psychiatric: Alert and oriented to person, place, and situation. Pleasant and cooperative.    Labs and Imaging on Admission: I have personally reviewed following labs and imaging studies  CBC: Recent Labs  Lab 08/25/20 1522  WBC 11.8*  NEUTROABS 6.1  HGB 11.4*  HCT 30.5*  MCV 88.7  PLT 446*   Basic Metabolic Panel: Recent Labs  Lab 08/25/20 1522  NA 142  K 4.4  CL 111  CO2 24  GLUCOSE 93  BUN 10  CREATININE 0.84  CALCIUM 9.6   GFR: CrCl cannot be calculated (Unknown ideal weight.). Liver Function Tests: Recent Labs  Lab 08/25/20 1522  AST 20  ALT 14  ALKPHOS 39  BILITOT 1.2  PROT 8.0  ALBUMIN 4.8   No results for input(s): LIPASE, AMYLASE in the last 168 hours. No results for input(s): AMMONIA in the last 168 hours. Coagulation Profile: No results for input(s): INR, PROTIME in the last 168 hours. Cardiac Enzymes: No results for input(s): CKTOTAL, CKMB, CKMBINDEX, TROPONINI in the last 168 hours. BNP (last 3 results) No results for input(s): PROBNP in the last 8760 hours. HbA1C: No results for input(s): HGBA1C in the last 72 hours. CBG: No results for input(s): GLUCAP in the last 168 hours. Lipid Profile: No results for input(s): CHOL, HDL, LDLCALC, TRIG, CHOLHDL, LDLDIRECT in the last 72 hours. Thyroid Function Tests: No results for  input(s): TSH, T4TOTAL, FREET4, T3FREE, THYROIDAB in the last 72 hours. Anemia Panel: Recent Labs    08/25/20 1505  RETICCTPCT 3.3*   Urine analysis:    Component Value Date/Time   COLORURINE YELLOW 10/24/2019 0230   APPEARANCEUR CLEAR 10/24/2019 0230   LABSPEC 1.016 10/24/2019 0230   PHURINE 5.0 10/24/2019 0230   GLUCOSEU NEGATIVE 10/24/2019 0230   HGBUR NEGATIVE 10/24/2019 0230   BILIRUBINUR neg 04/07/2020 1014   KETONESUR NEGATIVE 10/24/2019 0230   PROTEINUR Negative 04/07/2020 1014   PROTEINUR NEGATIVE 10/24/2019 0230   UROBILINOGEN 0.2 04/07/2020 1014   UROBILINOGEN 1.0 12/25/2012 1755   NITRITE neg 04/07/2020 1014   NITRITE NEGATIVE 10/24/2019 0230   LEUKOCYTESUR Small (1+) (A) 04/07/2020 1014   LEUKOCYTESUR TRACE (A) 10/24/2019 0230   Sepsis Labs: @LABRCNTIP (procalcitonin:4,lacticidven:4) )  No results found for this or any previous visit (from the past 240 hour(s)).   Radiological Exams on Admission: DG Chest 2 View  Result Date: 08/25/2020 CLINICAL DATA:  History of sickle cell disease with chest pain, initial encounter EXAM: CHEST - 2 VIEW COMPARISON:  07/23/2019 FINDINGS: Cardiac shadows within normal limits. Are clear bilaterally. Mild changes of the thoracic spine are noted consistent with the given clinical history. No acute bony abnormality is seen. IMPRESSION: No acute abnormality noted. Electronically Signed   By: Alcide Clever M.D.   On: 08/25/2020 16:11    EKG: Independently reviewed. Sinus rhythm.   Assessment/Plan   1. Sickle cell pain crisis  - Continue IVF, schedule Toradol, and start weight-based Dilaudid PCA with frequent reassessments    2. Chronic pain  - Continue long-acting oxycodone    DVT prophylaxis: Lovenox  Code Status: Full  Level of Care: Level of care: Med-Surg Family Communication: None present  Disposition Plan:  Patient is from: home  Anticipated d/c is to: Home  Anticipated d/c date is: 08/27/20 Patient currently: Pending  pain-control  Consults called: None  Admission status: Inpatient     Briscoe Deutscher, MD [facility]  08/25/2020, 7:48 PM

## 2020-08-25 NOTE — ED Triage Notes (Signed)
Patient here from home reporting sickle cell pain crisis that started this morning, home meds with no relief.

## 2020-08-25 NOTE — ED Notes (Signed)
ED TO INPATIENT HANDOFF REPORT  Name/Age/Gender Lindsey [name] 50 y.o. female  Code Status    Code Status Orders  (From admission, onward)         Start     Ordered   08/25/20 1921  Full code  Continuous        08/25/20 1923        Code Status History    Date Active Date Inactive Code Status Order ID Comments User Context   07/08/2020 0921 07/08/2020 2221 Full Code [id number]  Massie Maroon, FNP Inpatient   06/27/2020 0410 06/29/2020 2013 Full Code [id number]  John Giovanni, MD ED   06/04/2020 0903 06/04/2020 2044 Full Code [id number]  Massie Maroon, FNP Inpatient   05/03/2020 2118 05/04/2020 2027 Full Code [id number]  Charlsie Quest, MD ED   04/21/2020 0326 04/22/2020 1929 Full Code [id number]  Eduard Clos, MD ED   12/15/2019 2259 12/16/2019 2035 Full Code [id number]  Hillary Bow, DO ED   11/19/2019 0402 11/21/2019 1946 Full Code [id number]  Eduard Clos, MD ED   10/23/2019 1736 10/25/2019 1809 Full Code [id number]  Massie Maroon, FNP Inpatient   10/23/2019 1245 10/23/2019 1736 Full Code [id number]  Massie Maroon, FNP Inpatient   09/10/2019 0028 09/10/2019 2143 Full Code [id number]  Charlsie Quest, MD ED   07/23/2019 2255 07/25/2019 1630 Full Code [id number]  Hillary Bow, DO ED   02/28/2019 0826 02/28/2019 1749 Full Code [id number]  Massie Maroon, FNP Inpatient   11/22/2018 1352 11/26/2018 1703 Full Code [id number]  Massie Maroon, FNP Inpatient   11/22/2018 0936 11/22/2018 1352 Full Code [id number]  Massie Maroon, FNP Inpatient   09/25/2017 1325 09/27/2017 1842 Full Code [id number]  Delaine Lame, MD Inpatient   09/03/2016 2328 09/04/2016 2051 Full Code [id number]  Briscoe Deutscher, MD ED   07/20/2012 1120 07/21/2012 1447 Full Code [id number]  Eddie North, MD Inpatient   Advance Care Planning Activity      Home/SNF/Other Home   Chief Complaint Sickle cell pain crisis (HCC) [D57.00]  Level of Care/Admitting Diagnosis ED Disposition    ED Disposition  Condition Comment   Admit  Hospital Area: [facility] [[id number]]  Level of Care: Med-Surg [16]  May admit patient to [facility] or [facility] if equivalent level of care is available:: No  Covid Evaluation: Asymptomatic Screening Protocol (No Symptoms)  Diagnosis: Sickle cell pain crisis [facility]) [[id number]]  Admitting Physician: Briscoe Deutscher [[id number]]  Attending Physician: Briscoe Deutscher [[id number]]  Estimated length of stay: past midnight tomorrow  Certification:: I certify this patient will need inpatient services for at least 2 midnights       Medical History Past Medical History:  Diagnosis Date  . 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 Opioid dependence in remission (HCC)   . Sickle cell crisis (HCC)     Allergies No Known Allergies  IV Location/Drains/Wounds Patient Lines/Drains/Airways Status    Active Line/Drains/Airways    Name Placement date Placement time Site Days   Peripheral IV 08/25/20 Right Antecubital 08/25/20  1629  Antecubital  less than 1          Labs/Imaging Results for orders placed or performed during the hospital encounter of 08/25/20 (from the past 48 hour(s))  Reticulocytes     Status: Abnormal   Collection Time: 08/25/20  3:05 PM  Result Value Ref Range   Retic Ct Pct 3.3 (H) 0.4 - 3.1 %  RBC. 3.42 (L) 3.87 - 5.11 MIL/uL   Retic Count, Absolute 112.2 19.0 - 186.0 K/uL   Immature Retic Fract 29.0 (H) 2.3 - 15.9 %    Comment: Performed at [facility], [address] [place], [place] [address]  Comprehensive metabolic panel     Status: None   Collection Time: 08/25/20  3:22 PM  Result Value Ref Range   Sodium 142 135 - 145 mmol/L   Potassium 4.4 3.5 - 5.1 mmol/L   Chloride 111 98 - 111 mmol/L   CO2 24 22 - 32 mmol/L   Glucose, Bld 93 70 - 99 mg/dL    Comment: Glucose reference range applies only to samples taken after fasting for at least 8 hours.   BUN 10 6 - 20 mg/dL   Creatinine, Ser 9.140.84 0.44  - 1.00 mg/dL   Calcium 9.6 8.9 - 78.210.3 mg/dL   Total Protein 8.0 6.5 - 8.1 g/dL   Albumin 4.8 3.5 - 5.0 g/dL   AST 20 15 - 41 U/L   ALT 14 0 - 44 U/L   Alkaline Phosphatase 39 38 - 126 U/L   Total Bilirubin 1.2 0.3 - 1.2 mg/dL   GFR, Estimated >95>60 >62>60 mL/min    Comment: (NOTE) Calculated using the CKD-EPI Creatinine Equation (2021)    Anion gap 7 5 - 15    Comment: Performed at [facility], [address] [place], [place] [address]  CBC with Differential     Status: Abnormal   Collection Time: 08/25/20  3:22 PM  Result Value Ref Range   WBC 11.8 (H) 4.0 - 10.5 K/uL   RBC 3.44 (L) 3.87 - 5.11 MIL/uL   Hemoglobin 11.4 (L) 12.0 - 15.0 g/dL   HCT 57.830.5 (L) 46.936.0 - 62.946.0 %   MCV 88.7 80.0 - 100.0 fL   MCH 33.1 26.0 - 34.0 pg   MCHC 37.4 (H) 30.0 - 36.0 g/dL   RDW 52.813.8 41.311.5 - 24.415.5 %   Platelets 446 (H) 150 - 400 K/uL   nRBC 0.3 (H) 0.0 - 0.2 %   Neutrophils Relative % 52 %   Neutro Abs 6.1 1.7 - 7.7 K/uL   Lymphocytes Relative 41 %   Lymphs Abs 4.9 (H) 0.7 - 4.0 K/uL   Monocytes Relative 4 %   Monocytes Absolute 0.5 0.1 - 1.0 K/uL   Eosinophils Relative 3 %   Eosinophils Absolute 0.3 0.0 - 0.5 K/uL   Basophils Relative 0 %   Basophils Absolute 0.1 0.0 - 0.1 K/uL   Immature Granulocytes 0 %   Abs Immature Granulocytes 0.04 0.00 - 0.07 K/uL    Comment: Performed at [facility], [address] [place], [place] [address]  I-Stat beta hCG blood, ED     Status: None   Collection Time: 08/25/20  3:32 PM  Result Value Ref Range   I-stat hCG, quantitative <5.0 <5 mIU/mL   Comment 3            Comment:   GEST. AGE      CONC.  (mIU/mL)   <=1 WEEK        5 - 50     2 WEEKS       50 - 500     3 WEEKS       100 - 10,000     4 WEEKS     1,000 - 30,000        FEMALE AND NON-PREGNANT FEMALE:  LESS THAN 5 mIU/mL   Troponin I (High Sensitivity)     Status: None   Collection Time: 08/25/20  4:42 PM  Result Value Ref Range   Troponin I (High  Sensitivity) 2 <18 ng/L    Comment: (NOTE) Elevated high sensitivity troponin I (hsTnI) values and significant  changes across serial measurements may suggest ACS but many other  chronic and acute conditions are known to elevate hsTnI results.  Refer to the "Links" section for chest pain algorithms and additional  guidance. Performed at [facility], [address] [place], [place] [address]   Troponin I (High Sensitivity)     Status: None   Collection Time: 08/25/20  6:38 PM  Result Value Ref Range   Troponin I (High Sensitivity) 3 <18 ng/L    Comment: (NOTE) Elevated high sensitivity troponin I (hsTnI) values and significant  changes across serial measurements may suggest ACS but many other  chronic and acute conditions are known to elevate hsTnI results.  Refer to the "Links" section for chest pain algorithms and additional  guidance. Performed at [facility], [address], [place], [place] [address]    DG Chest 2 View  Result Date: 08/25/2020 CLINICAL DATA:  History of sickle cell disease with chest pain, initial encounter EXAM: CHEST - 2 VIEW COMPARISON:  07/23/2019 FINDINGS: Cardiac shadows within normal limits. Are clear bilaterally. Mild changes of the thoracic spine are noted consistent with the given clinical history. No acute bony abnormality is seen. IMPRESSION: No acute abnormality noted. Electronically Signed   By: Alcide Clever M.D.   On: 08/25/2020 16:11    Pending Labs Unresulted Labs (From admission, onward)          Start     Ordered   09/01/20 0500  Creatinine, serum  (enoxaparin (LOVENOX)    CrCl >/= 30 ml/min)  Weekly,   R     Comments: while on enoxaparin therapy    08/25/20 1923   08/25/20 1923  SARS CORONAVIRUS 2 (TAT 6-24 HRS) Nasopharyngeal Nasopharyngeal Swab  (Tier 3 - Symptomatic/asymptomatic)  Once,   STAT       Question Answer Comment  Is this test for diagnosis or screening Screening   Symptomatic for  COVID-19 as defined by CDC No   Hospitalized for COVID-19 No   Admitted to ICU for COVID-19 No   Previously tested for COVID-19 Yes   Resident in a congregate (group) care setting Unknown   Employed in healthcare setting Unknown   Pregnant Unknown   Has patient completed COVID vaccination(s) (2 doses of Pfizer/Moderna 1 dose of [employer]) Unknown      08/25/20 1923          Vitals/Pain Today's Vitals   08/25/20 1800 08/25/20 1830 08/25/20 1837 08/25/20 1900  BP: 114/88 101/67  116/68  Pulse: 63 68  83  Resp: 16 17  14   Temp:      TempSrc:      SpO2: 99% 97%  98%  PainSc:   8      Isolation Precautions No active isolations  Medications Medications  oxyCODONE (OXYCONTIN) 12 hr tablet 10 mg (has no administration in time range)  gabapentin (NEURONTIN) capsule 300 mg (has no administration in time range)  albuterol (VENTOLIN HFA) 108 (90 Base) MCG/ACT inhaler 2 puff (has no administration in time range)  senna-docusate (Senokot-S) tablet 1 tablet (has no administration in time range)  polyethylene glycol (MIRALAX / GLYCOLAX) packet 17 g (  has no administration in time range)  naloxone [facility]) injection 0.4 mg (has no administration in time range)    And  sodium chloride flush (NS) 0.9 % injection 9 mL (has no administration in time range)  ondansetron (ZOFRAN) injection 4 mg (has no administration in time range)  diphenhydrAMINE (BENADRYL) capsule 25 mg (has no administration in time range)    Or  diphenhydrAMINE (BENADRYL) 25 mg in sodium chloride 0.9 % 50 mL IVPB (has no administration in time range)  enoxaparin (LOVENOX) injection 40 mg (has no administration in time range)  ketorolac (TORADOL) 15 MG/ML injection 15 mg (has no administration in time range)  HYDROmorphone (DILAUDID) 1 mg/mL PCA injection (has no administration in time range)  0.45 % sodium chloride infusion (has no administration in time range)  melatonin tablet 10 mg (has no administration in  time range)  ketorolac (TORADOL) 30 MG/ML injection 30 mg (30 mg Intravenous Given 08/25/20 1629)  ondansetron (ZOFRAN) injection 4 mg (4 mg Intravenous Given 08/25/20 1629)  HYDROmorphone (DILAUDID) injection 2 mg (2 mg Intravenous Given 08/25/20 1629)  sodium chloride 0.9 % bolus 1,000 mL (1,000 mLs Intravenous Bolus 08/25/20 1635)  HYDROmorphone (DILAUDID) injection 2 mg (2 mg Intravenous Given 08/25/20 1801)  LORazepam (ATIVAN) injection 0.5 mg (0.5 mg Intravenous Given 08/25/20 1801)    Mobility ambulatory

## 2020-08-25 NOTE — ED Triage Notes (Signed)
Emergency Medicine Provider Triage Evaluation Note  Lindsey [name] , a 50 y.o. female  was evaluated in triage.  Pt complains of sickle cell pain crisis, endorses left shoulder pain and bilateral lower extremity pain.  Started 2 days ago, feels like her typical pain crisis, denies any alleviating factors..  Review of Systems  Positive: Left shoulder and lower extremity pain Negative: Denies chest pain, shortness of breath  Physical Exam  BP 130/86 (BP Location: Left Arm)   Pulse 100   Temp 98.5 F (36.9 C) (Oral)   Resp (!) 22   SpO2 100%  Gen:   Awake, no distress   HEENT:  Atraumatic  Resp:  Normal effort  Cardiac:  Normal rate  MSK:   Moves extremities without difficulty  Neuro:  Speech clear   Medical Decision Making  Medically screening exam initiated at 3:30 PM.  Appropriate orders placed.  Lindsey [name] was informed that the remainder of the evaluation will be completed by another provider, this initial triage assessment does not replace that evaluation, and the importance of remaining in the ED until their evaluation is complete.  Clinical Impression  Patient has sickle cell pain crisis, lab work and imaging has been ordered, patient will need further work-up here in the emergency department   Lindsey Sage, PA-C 08/25/20 1531

## 2020-08-26 LAB — SARS CORONAVIRUS 2 (TAT 6-24 HRS): SARS Coronavirus 2: NEGATIVE

## 2020-08-26 MED ORDER — ADULT MULTIVITAMIN W/MINERALS CH
1.0000 | ORAL_TABLET | Freq: Every day | ORAL | Status: DC
  Administered 2020-08-27 – 2020-08-29 (×3): 1 via ORAL
  Filled 2020-08-26 (×3): qty 1

## 2020-08-26 MED ORDER — ENSURE ENLIVE PO LIQD
237.0000 mL | Freq: Two times a day (BID) | ORAL | Status: DC
  Administered 2020-08-26 – 2020-08-29 (×2): 237 mL via ORAL

## 2020-08-26 MED ORDER — ACETAMINOPHEN 500 MG PO TABS
1000.0000 mg | ORAL_TABLET | ORAL | Status: AC
  Administered 2020-08-26: 1000 mg via ORAL
  Filled 2020-08-26: qty 2

## 2020-08-26 MED ORDER — OXYCODONE HCL 5 MG PO TABS
10.0000 mg | ORAL_TABLET | Freq: Four times a day (QID) | ORAL | Status: DC | PRN
  Administered 2020-08-26 – 2020-08-28 (×4): 10 mg via ORAL
  Filled 2020-08-26 (×4): qty 2

## 2020-08-26 NOTE — Progress Notes (Signed)
Initial Nutrition Assessment  DOCUMENTATION CODES:   Not applicable  INTERVENTION:   Ensure Enlive po BID, each supplement provides 350 kcal and 20 grams of protein  MVI po daily   Liberalize diet   NUTRITION DIAGNOSIS:   Inadequate oral intake related to acute illness as evidenced by per patient/family report.  GOAL:   Patient will meet greater than or equal to 90% of their needs  MONITOR:   PO intake,Supplement acceptance,Labs,Weight trends,Skin,I & O's  REASON FOR ASSESSMENT:   Malnutrition Screening Tool    ASSESSMENT:   50 y.o. female with medical history significant for opiate abuse, chronic pain and sickle cell anemia who presented to the emergency department for evaluation of severe pain.  Spoke with pt via phone. Pt reports decreased appetite and oral intake for several days pta but reports that she is eating fine now. Pt reports a 20lb weight loss over the past several months. Per chart, pt weighed 156lbs in 08/2019. Pt appears to be down 11lbs(7%) over the past year, 7lbs(4%) over the past 8 months; this is not significant. RD will add supplements and MVI to help pt meet her estimated needs (prefers strawberry). RD will also liberalize patient's diet.   Medications reviewed and include: lovenox, hydromorphone, melatonin, oxycodone, senokot  Labs reviewed: wbc- 11.8(H)  NUTRITION - FOCUSED PHYSICAL EXAM: Unable to perform at this time   Diet Order:   Diet Order            Diet regular Room service appropriate? Yes; Fluid consistency: Thin  Diet effective now                EDUCATION NEEDS:   No education needs have been identified at this time  Skin:  Skin Assessment: Reviewed RN Assessment  Last BM:  4/19  Height:   Ht Readings from Last 1 Encounters:  08/25/20 5' (1.524 m)    Weight:   Wt Readings from Last 1 Encounters:  08/25/20 66 kg    Ideal Body Weight:  45.45 kg  BMI:  Body mass index is 28.42 kg/m.  Estimated Nutritional  Needs:   Kcal:  1400-1600kcal/day  Protein:  70-80g/day  Fluid:  1.4-1.6L/day  Betsey Holiday MS, RD, LDN Please refer to [facility] for RD and/or RD on-call/weekend/after hours pager

## 2020-08-26 NOTE — Progress Notes (Signed)
Subjective: A 50 year old female with history of sickle cell disease admitted last night with sickle cell painful crisis.  Patient still is in significant pain.  Rated at 9 out of 10.  Currently on Dilaudid PCA, Toradol, long-acting oxycodone as well as Tylenol as needed oxycodone.  We will follow closely as patient agrees to treatment.  Denies any fever or chills denied any nausea vomiting or diarrhea  Objective: Vital signs in last 24 hours: Temp:  [97.8 F (36.6 C)-98.6 F (37 C)] 97.8 F (36.6 C) (04/20 0555) Pulse Rate:  [57-100] 57 (04/20 0555) Resp:  [10-26] 14 (04/20 0735) BP: (101-130)/(62-94) 112/73 (04/20 0555) SpO2:  [95 %-100 %] 95 % (04/20 0735) Weight:  [66 kg] 66 kg (04/19 2225) Weight change:  Last BM Date: 08/25/20  Intake/Output from previous day: 04/19 0701 - 04/20 0700 In: 1312.6 [P.O.:540; I.V.:772.6] Out: 600 [Urine:600] Intake/Output this shift: No intake/output data recorded.  General appearance: alert, cooperative, appears stated age and no distress Neck: no adenopathy, no carotid bruit, no JVD, supple, symmetrical, trachea midline and thyroid not enlarged, symmetric, no tenderness/mass/nodules Back: symmetric, no curvature. ROM normal. No CVA tenderness. Resp: clear to auscultation bilaterally Cardio: regular rate and rhythm, S1, S2 normal, no murmur, click, rub or gallop GI: soft, non-tender; bowel sounds normal; no masses,  no organomegaly Extremities: extremities normal, atraumatic, no cyanosis or edema Pulses: 2+ and symmetric Skin: Skin color, texture, turgor normal. No rashes or lesions Neurologic: Grossly normal  Lab Results: Recent Labs    08/25/20 1522  WBC 11.8*  HGB 11.4*  HCT 30.5*  PLT 446*   BMET Recent Labs    08/25/20 1522  NA 142  K 4.4  CL 111  CO2 24  GLUCOSE 93  BUN 10  CREATININE 0.84  CALCIUM 9.6    Studies/Results: DG Chest 2 View  Result Date: 08/25/2020 CLINICAL DATA:  History of sickle cell disease with  chest pain, initial encounter EXAM: CHEST - 2 VIEW COMPARISON:  07/23/2019 FINDINGS: Cardiac shadows within normal limits. Are clear bilaterally. Mild changes of the thoracic spine are noted consistent with the given clinical history. No acute bony abnormality is seen. IMPRESSION: No acute abnormality noted. Electronically Signed   By: Alcide Clever M.D.   On: 08/25/2020 16:11    Medications: I have reviewed the patient's current medications.  Assessment/Plan: A 50 year old admitted with sickle cell painful crisis.  #1 sickle cell painful crisis: Patient still in pain.  Continue with Dilaudid PCA, Toradol, IV fluids and supportive care.  #2 anemia of chronic disease: H&H at baseline.  Continue treatment.  #3 chronic pain syndrome: Continue with OxyContin.  #4 moderate malnutrition: Continue protein supplements.  LOS: 1 day   Brynlyn Dade,LAWAL 08/26/2020, 8:34 AM

## 2020-08-26 NOTE — Progress Notes (Signed)
Patient continues to have pain 7-8 irrespective of pain meds and PCA. Provider on call notified.

## 2020-08-27 NOTE — Progress Notes (Signed)
Subjective: Patient is still having pain at 8 out of 10.  She is crying.  Pain persists in her legs and back.  No nausea vomiting or diarrhea.  She is still on Dilaudid PCA but not using adequate amounts.  She is on Lovenox and OxyContin.  Objective: Vital signs in last 24 hours: Temp:  [97.7 F (36.5 C)-98.6 F (37 C)] 98.6 F (37 C) (04/21 2123) Pulse Rate:  [66-75] 70 (04/21 2123) Resp:  [12-18] 12 (04/21 2123) BP: (97-122)/(57-72) 117/72 (04/21 2123) SpO2:  [93 %-100 %] 100 % (04/21 2123) FiO2 (%):  [98 %] 98 % (04/21 1638) Weight change:  Last BM Date: 08/25/20  Intake/Output from previous day: 04/20 0701 - 04/21 0700 In: 1760.3 [P.O.:1320; I.V.:440.3] Out: 600 [Urine:600] Intake/Output this shift: Total I/O In: 120 [P.O.:120] Out: -   General appearance: alert, cooperative, appears stated age and no distress Neck: no adenopathy, no carotid bruit, no JVD, supple, symmetrical, trachea midline and thyroid not enlarged, symmetric, no tenderness/mass/nodules Back: symmetric, no curvature. ROM normal. No CVA tenderness. Resp: clear to auscultation bilaterally Cardio: regular rate and rhythm, S1, S2 normal, no murmur, click, rub or gallop GI: soft, non-tender; bowel sounds normal; no masses,  no organomegaly Extremities: extremities normal, atraumatic, no cyanosis or edema Pulses: 2+ and symmetric Skin: Skin color, texture, turgor normal. No rashes or lesions Neurologic: Grossly normal  Lab Results: Recent Labs    08/25/20 1522  WBC 11.8*  HGB 11.4*  HCT 30.5*  PLT 446*   BMET Recent Labs    08/25/20 1522  NA 142  K 4.4  CL 111  CO2 24  GLUCOSE 93  BUN 10  CREATININE 0.84  CALCIUM 9.6    Studies/Results: No results found.  Medications: I have reviewed the patient's current medications.  Assessment/Plan: A 50 year old admitted with sickle cell painful crisis.  #1 sickle cell painful crisis: Patient still in pain.  Counseling provided.  Continue with  Dilaudid PCA, Toradol, IV fluids and supportive care.  #2 anemia of chronic disease: H&H at baseline.  Continue treatment.  #3 chronic pain syndrome: Continue with OxyContin.  Continue her short acting also.  #4 moderate malnutrition: Continue protein supplements.   LOS: 2 days   Cybele Maule,LAWAL 08/27/2020, 11:52 PM

## 2020-08-28 NOTE — Progress Notes (Signed)
Subjective: Patient is still having pain at 7 out of 10.  Pain is progressively getting better.  Still on Dilaudid PCA with oral medication as well as Toradol.  No nausea vomiting or diarrhea.  She is still on her OxyContin.  Objective: Vital signs in last 24 hours: Temp:  [97.7 F (36.5 C)-98.9 F (37.2 C)] 98.9 F (37.2 C) (04/22 0855) Pulse Rate:  [60-75] 66 (04/22 0855) Resp:  [12-18] 16 (04/22 0855) BP: (101-122)/(57-77) 106/77 (04/22 0855) SpO2:  [91 %-100 %] 91 % (04/22 0855) FiO2 (%):  [98 %] 98 % (04/21 1638) Weight change:  Last BM Date: 08/25/20  Intake/Output from previous day: 04/21 0701 - 04/22 0700 In: 1480 [P.O.:1440; IV Piggyback:40] Out: 800 [Urine:800] Intake/Output this shift: No intake/output data recorded.  General appearance: alert, cooperative, appears stated age and no distress Neck: no adenopathy, no carotid bruit, no JVD, supple, symmetrical, trachea midline and thyroid not enlarged, symmetric, no tenderness/mass/nodules Back: symmetric, no curvature. ROM normal. No CVA tenderness. Resp: clear to auscultation bilaterally Cardio: regular rate and rhythm, S1, S2 normal, no murmur, click, rub or gallop GI: soft, non-tender; bowel sounds normal; no masses,  no organomegaly Extremities: extremities normal, atraumatic, no cyanosis or edema Pulses: 2+ and symmetric Skin: Skin color, texture, turgor normal. No rashes or lesions Neurologic: Grossly normal  Lab Results: Recent Labs    08/25/20 1522  WBC 11.8*  HGB 11.4*  HCT 30.5*  PLT 446*   BMET Recent Labs    08/25/20 1522  NA 142  K 4.4  CL 111  CO2 24  GLUCOSE 93  BUN 10  CREATININE 0.84  CALCIUM 9.6    Studies/Results: No results found.  Medications: I have reviewed the patient's current medications.  Assessment/Plan: A 50 year old admitted with sickle cell painful crisis.  #1 sickle cell painful crisis: Patient still in pain.  Some improvement so no changes current regimen.   Counseling provided.  Continue with Dilaudid PCA, Toradol, IV fluids and supportive care.  #2 anemia of chronic disease: H&H at baseline.  Continue treatment.  #3 chronic pain syndrome: Continue with OxyContin.  Continue her short acting also.  #4 moderate malnutrition: Continue protein supplements.   LOS: 3 days   Rachel Rison,LAWAL 08/28/2020, 9:34 AM

## 2020-08-29 LAB — CBC WITH DIFFERENTIAL/PLATELET
Abs Immature Granulocytes: 0.05 10*3/uL (ref 0.00–0.07)
Basophils Absolute: 0 10*3/uL (ref 0.0–0.1)
Basophils Relative: 0 %
Eosinophils Absolute: 0.5 10*3/uL (ref 0.0–0.5)
Eosinophils Relative: 4 %
HCT: 23.1 % — ABNORMAL LOW (ref 36.0–46.0)
Hemoglobin: 8.7 g/dL — ABNORMAL LOW (ref 12.0–15.0)
Immature Granulocytes: 0 %
Lymphocytes Relative: 15 %
Lymphs Abs: 1.9 10*3/uL (ref 0.7–4.0)
MCH: 33.1 pg (ref 26.0–34.0)
MCHC: 37.7 g/dL — ABNORMAL HIGH (ref 30.0–36.0)
MCV: 87.8 fL (ref 80.0–100.0)
Monocytes Absolute: 0.8 10*3/uL (ref 0.1–1.0)
Monocytes Relative: 6 %
Neutro Abs: 9.7 10*3/uL — ABNORMAL HIGH (ref 1.7–7.7)
Neutrophils Relative %: 75 %
Platelets: 370 10*3/uL (ref 150–400)
RBC: 2.63 MIL/uL — ABNORMAL LOW (ref 3.87–5.11)
RDW: 13.9 % (ref 11.5–15.5)
WBC: 13 10*3/uL — ABNORMAL HIGH (ref 4.0–10.5)
nRBC: 0.3 % — ABNORMAL HIGH (ref 0.0–0.2)

## 2020-08-29 LAB — COMPREHENSIVE METABOLIC PANEL
ALT: 17 U/L (ref 0–44)
AST: 18 U/L (ref 15–41)
Albumin: 3.9 g/dL (ref 3.5–5.0)
Alkaline Phosphatase: 31 U/L — ABNORMAL LOW (ref 38–126)
Anion gap: 6 (ref 5–15)
BUN: 11 mg/dL (ref 6–20)
CO2: 27 mmol/L (ref 22–32)
Calcium: 8.8 mg/dL — ABNORMAL LOW (ref 8.9–10.3)
Chloride: 107 mmol/L (ref 98–111)
Creatinine, Ser: 0.75 mg/dL (ref 0.44–1.00)
GFR, Estimated: >60 mL/min (ref >60)
Glucose, Bld: 109 mg/dL — ABNORMAL HIGH (ref 70–99)
Potassium: 4.2 mmol/L (ref 3.5–5.1)
Sodium: 140 mmol/L (ref 135–145)
Total Bilirubin: 1.1 mg/dL (ref 0.3–1.2)
Total Protein: 6.4 g/dL — ABNORMAL LOW (ref 6.5–8.1)

## 2020-08-29 MED ORDER — OXYCODONE HCL ER 10 MG PO T12A: 10.0000 mg | EXTENDED_RELEASE_TABLET | Freq: Two times a day (BID) | ORAL | 0 refills | Status: DC

## 2020-08-29 NOTE — Discharge Summary (Signed)
Physician Discharge Summary  Patient ID: Lindsey [name] MRN: [id number] DOB/AGE: August 02, 1970 50 y.o.  Admit date: 08/25/2020 Discharge date: 08/29/2020  Admission Diagnoses:  Discharge Diagnoses:  Principal Problem:   Sickle cell pain crisis [facility]) Active Problems:   Chronic pain   Discharged Condition: good  Hospital Course: Patient was admitted with sickle cell painful crisis in the setting of chronic pain.  She was admitted and placed on Dilaudid PCA with Toradol, IV half-normal saline with her chronic pain medicine including OxyContin.  Patient responded to treatment and was doing better.  Pain went from 10 out of 10 to 4 out of 10 at time of discharge.  She was transition to her home regimen of pain medication.  Patient to follow-up with PCP with discharge.  Also to continue with current treatment.  Consults: None  Significant Diagnostic Studies: labs: Serial CBCs and CMP is checked.  No requirement for transfusion.  Treatments: IV hydration and analgesia: acetaminophen and Dilaudid  Discharge Exam: Blood pressure 110/64, pulse 66, temperature 99.2 F (37.3 C), temperature source Oral, resp. rate 14, height 5' (1.524 m), weight 66 kg, SpO2 96 %. General appearance: alert, cooperative, appears stated age and no distress Neck: no adenopathy, no carotid bruit, no JVD, supple, symmetrical, trachea midline and thyroid not enlarged, symmetric, no tenderness/mass/nodules Back: symmetric, no curvature. ROM normal. No CVA tenderness. Resp: clear to auscultation bilaterally Cardio: regular rate and rhythm, S1, S2 normal, no murmur, click, rub or gallop GI: soft, non-tender; bowel sounds normal; no masses,  no organomegaly Extremities: extremities normal, atraumatic, no cyanosis or edema Pulses: 2+ and symmetric Skin: Skin color, texture, turgor normal. No rashes or lesions Neurologic: Grossly normal  Disposition: Discharge disposition: 01-Home or Self Care       Discharge  Instructions    Diet - low sodium heart healthy   Complete by: As directed    Increase activity slowly   Complete by: As directed      Allergies as of 08/29/2020   No Known Allergies     Medication List    TAKE these medications   albuterol 108 (90 Base) MCG/ACT inhaler Commonly known as: VENTOLIN HFA INHALE 2 PUFFS INTO THE LUNGS EVERY 6 HOURS AS NEEDED FOR WHEEZING OR SHORTNESS OF BREATH What changed: reasons to take this   DULoxetine 20 MG capsule Commonly known as: Cymbalta Take 1 capsule (20 mg total) by mouth daily.   ergocalciferol 1.25 MG (50000 UT) capsule Commonly known as: VITAMIN D2 Take 1 capsule (50,000 Units total) by mouth once a week.   gabapentin 300 MG capsule Commonly known as: NEURONTIN Take 1 capsule (300 mg total) by mouth 3 (three) times daily.   ibuprofen 800 MG tablet Commonly known as: ADVIL TAKE 1 TABLET(800 MG) BY MOUTH EVERY 8 HOURS AS NEEDED What changed: See the new instructions.   Melatonin 10 MG Tabs Take 10 mg by mouth at bedtime.   Oxycodone HCl 10 MG Tabs Take 1 tablet (10 mg total) by mouth every 6 (six) hours as needed. What changed: reasons to take this   oxyCODONE 10 mg 12 hr tablet Commonly known as: OXYCONTIN Take 1 tablet (10 mg total) by mouth every 12 (twelve) hours. What changed: Another medication with the same name was changed. Make sure you understand how and when to take each.        SignedLonia Blood 08/29/2020, 12:00 PM   Time spent 34 minutes

## 2020-08-29 NOTE — Plan of Care (Signed)
Reviewed written d/c instructions w pt and all questions answered. Pt verbalized understanding. Awaiting ride home. 

## 2020-08-31 ENCOUNTER — Other Ambulatory Visit: Payer: Self-pay

## 2020-08-31 ENCOUNTER — Other Ambulatory Visit: Payer: Self-pay | Admitting: Nurse Practitioner

## 2020-08-31 ENCOUNTER — Encounter (HOSPITAL_COMMUNITY): Payer: Self-pay

## 2020-08-31 ENCOUNTER — Telehealth: Payer: Self-pay | Admitting: Nurse Practitioner

## 2020-08-31 ENCOUNTER — Ambulatory Visit (HOSPITAL_COMMUNITY)
Admission: EM | Admit: 2020-08-31 | Discharge: 2020-08-31 | Disposition: A | Discharge status: Home / Self Care | Payer: Medicaid Other | Attending: Emergency Medicine | Admitting: Emergency Medicine

## 2020-08-31 ENCOUNTER — Telehealth: Payer: Self-pay

## 2020-08-31 ENCOUNTER — Telehealth (HOSPITAL_COMMUNITY): Payer: Self-pay | Admitting: General Practice

## 2020-08-31 ENCOUNTER — Other Ambulatory Visit: Payer: Self-pay | Admitting: Family Medicine

## 2020-08-31 DIAGNOSIS — I809 Phlebitis and thrombophlebitis of unspecified site: Secondary | ICD-10-CM | POA: Diagnosis not present

## 2020-08-31 DIAGNOSIS — G894 Chronic pain syndrome: Secondary | ICD-10-CM

## 2020-08-31 DIAGNOSIS — D5700 Hb-SS disease with crisis, unspecified: Secondary | ICD-10-CM

## 2020-08-31 MED ORDER — GABAPENTIN 300 MG PO CAPS: 300.0000 mg | ORAL_CAPSULE | Freq: Three times a day (TID) | ORAL | 1 refills | Status: DC

## 2020-08-31 MED ORDER — OXYCODONE HCL ER 10 MG PO T12A: 10.0000 mg | EXTENDED_RELEASE_TABLET | Freq: Two times a day (BID) | ORAL | 0 refills | Status: DC

## 2020-08-31 MED ORDER — DOXYCYCLINE HYCLATE 100 MG PO CAPS: 100.0000 mg | ORAL_CAPSULE | Freq: Two times a day (BID) | ORAL | 0 refills | Status: AC

## 2020-08-31 MED ORDER — OXYCODONE HCL 10 MG PO TABS: 10.0000 mg | ORAL_TABLET | Freq: Four times a day (QID) | ORAL | 0 refills | Status: DC | PRN

## 2020-08-31 NOTE — ED Provider Notes (Signed)
[facility]    CSN: [id number] Arrival date & time: 08/31/20  1038      History   Chief Complaint Chief Complaint  Patient presents with  . Arm/Wound Infection    HPI Lindsey [name] is a 50 y.o. female.   Patient here with right arm pain and swelling from IV site.  Reports recently being hospitalized due to a sickle cell crisis and stated that IV "did not feel right" for several days and then when IV was discontinued nurse noted that it was infected.  Patient reports being given antibiotic ointment which she has used with minimal relief.  Reports IV site now with some drainage.  Reports redness and swelling and to the right upper arm.  Denies any fevers, chest pain, shortness of breath, N/V/D, numbness, tingling, weakness, abdominal pain, or headaches.    ROS: As per HPI, all other pertinent ROS negative   The history is provided by the patient.    Past Medical History:  Diagnosis Date  . 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 Opioid dependence in remission (HCC)   . Sickle cell crisis [facility])     Patient Active Problem List   Diagnosis Date Noted  . Cough 06/27/2020  . Hypokalemia 06/27/2020  . Acute pain of left shoulder   . Right leg pain   . Sickle cell anemia with crisis (HCC) 09/10/2019  . Anemia of chronic disease 09/10/2019  . Current mild episode of major depressive disorder (HCC)   . Sickle cell crisis (HCC) 02/28/2019  . Opioid dependence in remission (HCC) 10/01/2017  . Sickle cell disease (HCC) 10/01/2017  . Chronic pain 10/01/2017  . 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 Leukocytosis 09/03/2016  . Sickle cell pain crisis (HCC) 07/20/2012  . 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 APPENDECTOMY    . CESAREAN SECTION    . OTHER SURGICAL HISTORY     c-section    OB History   No obstetric history on file.      Home Medications    Prior to Admission medications    Medication Sig Start Date End Date Taking? Authorizing Provider  doxycycline (VIBRAMYCIN) 100 MG capsule Take 1 capsule (100 mg total) by mouth 2 (two) times daily for 10 days. 08/31/20 09/10/20 Yes Ivette Loyal, NP  albuterol (VENTOLIN HFA) 108 (90 Base) MCG/ACT inhaler INHALE 2 PUFFS INTO THE LUNGS EVERY 6 HOURS AS NEEDED FOR WHEEZING OR SHORTNESS OF BREATH Patient taking differently: Inhale 2 puffs into the lungs every 6 (six) hours as needed. 06/01/20   Massie Maroon, FNP  DULoxetine (CYMBALTA) 20 MG capsule Take 1 capsule (20 mg total) by mouth daily. Patient not taking: Reported on 08/25/2020 07/21/20 07/21/21  Massie Maroon, FNP  ergocalciferol (VITAMIN D2) 1.25 MG (50000 UT) capsule Take 1 capsule (50,000 Units total) by mouth once a week. 10/16/19   Massie Maroon, FNP  gabapentin (NEURONTIN) 300 MG capsule Take 1 capsule (300 mg total) by mouth 3 (three) times daily. 07/01/20   Massie Maroon, FNP  ibuprofen (ADVIL) 800 MG tablet TAKE 1 TABLET(800 MG) BY MOUTH EVERY 8 HOURS AS NEEDED Patient taking differently: Take 800 mg by mouth every 8 (eight) hours as needed for moderate pain. 10/14/19   Massie Maroon, FNP  Melatonin 10 MG TABS Take 10 mg by mouth at bedtime.    [provider]  oxyCODONE (OXYCONTIN) 10 mg  12 hr tablet Take 1 tablet (10 mg total) by mouth every 12 (twelve) hours. 08/29/20 09/28/20  Rometta Emery, MD  Oxycodone HCl 10 MG TABS Take 1 tablet (10 mg total) by mouth every 6 (six) hours as needed. Patient taking differently: Take 10 mg by mouth every 6 (six) hours as needed (pain). 08/20/20   Massie Maroon, FNP    Family History Family History  Problem Relation Age of Onset  . Stroke Neg Hx        none that she knows of   . Seizures Neg Hx     Social History Social History   Tobacco Use  . Smoking status: Current Every Day Smoker    Packs/day: 0.00    Types: Cigars  . Smokeless tobacco: Never Used  Vaping Use  . Vaping Use: Former   Substance Use Topics  . Alcohol use: Yes    Comment: occasionally  . Drug use: No     Allergies   Patient has no known allergies.   Review of Systems Review of Systems  Skin: Positive for wound.  All other systems reviewed and are negative.    Physical Exam Triage Vital Signs ED Triage Vitals  Enc Vitals Group     BP 08/31/20 1131 123/73     Pulse Rate 08/31/20 1131 64     Resp 08/31/20 1131 18     Temp 08/31/20 1131 98.5 F (36.9 C)     Temp Source 08/31/20 1131 Oral     SpO2 08/31/20 1131 100 %     Weight --      Height --      Head Circumference --      Peak Flow --      Pain Score 08/31/20 1128 7     Pain Loc --      Pain Edu? --      Excl. in GC? --    No data found.  Updated Vital Signs BP 123/73 (BP Location: Left Arm)   Pulse 64   Temp 98.5 F (36.9 C) (Oral)   Resp 18   LMP 08/16/2020   SpO2 100%   Visual Acuity Right Eye Distance:   Left Eye Distance:   Bilateral Distance:    Right Eye Near:   Left Eye Near:    Bilateral Near:     Physical Exam Vitals and nursing note reviewed.  Constitutional:      General: She is not in acute distress.    Appearance: Normal appearance. She is not ill-appearing, toxic-appearing or diaphoretic.  HENT:     Head: Normocephalic and atraumatic.  Eyes:     Conjunctiva/sclera: Conjunctivae normal.  Cardiovascular:     Rate and Rhythm: Normal rate.     Pulses: Normal pulses.  Pulmonary:     Effort: Pulmonary effort is normal.  Abdominal:     General: Abdomen is flat.  Musculoskeletal:        General: Normal range of motion.     Cervical back: Normal range of motion.  Skin:    General: Skin is warm and dry.     Findings: Erythema (redness and swelling to right upper arm) and wound (see photo below) present.  Neurological:     General: No focal deficit present.     Mental Status: She is alert and oriented to [name], place, and time.  Psychiatric:        Mood and Affect: Mood normal.  UC Treatments / Results  Labs (all labs ordered are listed, but only abnormal results are displayed) Labs Reviewed - No data to display  EKG   Radiology No results found.  Procedures Procedures (including critical care time)  Medications Ordered in UC Medications - No data to display  Initial Impression / Assessment and Plan / UC Course  I have reviewed the triage vital signs and the nursing notes.  Pertinent labs & imaging results that were available during my care of the patient were reviewed by me and considered in my medical decision making (see chart for details).     Assessment negative for red flags or concerns.  Pheblitis likely from IV during hospitalization.  Doxycycline twice a day for the next 10 days.  May continue to use antibiotic ointment.  Apply warm compress 3-4 times a day for the next few days.  Strict ED follow-up for any worsening symptoms including worsening redness, swelling, fevers, or red streaks up arm.  Follow-up with PCP as needed.   Final Clinical Impressions(s) / UC Diagnoses   Final diagnoses:  Phlebitis     Discharge Instructions     Take the doxycycline twice a day for the next 10 days. You can continue to apply the antibiotic ointment if you would like.    Place a warm compress on the area 3-4 times a day.   Drink plenty of fluids and rest.   Return or go to the Emergency Department if symptoms worsen or do not improve in the next few days.      ED Prescriptions    Medication Sig Dispense Auth. Provider   doxycycline (VIBRAMYCIN) 100 MG capsule Take 1 capsule (100 mg total) by mouth 2 (two) times daily for 10 days. 20 capsule Ivette Loyal, NP     PDMP not reviewed this encounter.   Ivette Loyal, NP 08/31/20 1212

## 2020-08-31 NOTE — Telephone Encounter (Signed)
Patient called, requesting to come to the day hospital due to swollen right hand at where an IV was taken out last week.  Per provider, patient should go to the nearest urgent care to be seen today, make an appointment for a virtual visit tomorrow afternoon. Patient notified, verbalized understanding.

## 2020-08-31 NOTE — ED Triage Notes (Signed)
Pt presents with infection to right AC where she had an IV during a recent hospitalization; pt has significant swelling, redness, bruising & pain with yellow drainage X 2 days.

## 2020-08-31 NOTE — Progress Notes (Signed)
Reviewed PDMP substance reporting system prior to prescribing opiate medications. No inconsistencies noted.   Meds ordered this encounter  Medications  . Oxycodone HCl 10 MG TABS    Sig: Take 1 tablet (10 mg total) by mouth every 6 (six) hours as needed (pain).    Dispense:  60 tablet    Refill:  0    Order Specific Question:   Supervising Provider    Answer:   Lindsey [name] [id number]  . gabapentin (NEURONTIN) 300 MG capsule    Sig: Take 1 capsule (300 mg total) by mouth 3 (three) times daily.    Dispense:  90 capsule    Refill:  1    Order Specific Question:   Supervising Provider    Answer:   Lindsey [name] [[id number]]     Lindsey Nations  APRN, MSN, FNP-C Patient Care [facility] [address] [place], [place] [address] [phone]

## 2020-08-31 NOTE — Telephone Encounter (Signed)
Med refill  Oxycodone 10 mg Gabapentin 300 mg

## 2020-08-31 NOTE — Telephone Encounter (Signed)
Rx resent.

## 2020-08-31 NOTE — Discharge Instructions (Addendum)
Take the doxycycline twice a day for the next 10 days. You can continue to apply the antibiotic ointment if you would like.    Place a warm compress on the area 3-4 times a day.   Drink plenty of fluids and rest.   Return or go to the Emergency Department if symptoms worsen or do not improve in the next few days.

## 2020-09-01 ENCOUNTER — Telehealth: Payer: Medicaid Other | Admitting: Family Medicine

## 2020-09-14 ENCOUNTER — Telehealth: Payer: Self-pay

## 2020-09-14 NOTE — Telephone Encounter (Signed)
Med refill Oxycodone 

## 2020-09-15 ENCOUNTER — Other Ambulatory Visit: Payer: Self-pay | Admitting: Family Medicine

## 2020-09-15 DIAGNOSIS — G894 Chronic pain syndrome: Secondary | ICD-10-CM

## 2020-09-15 MED ORDER — OXYCODONE HCL 10 MG PO TABS: 10.0000 mg | ORAL_TABLET | Freq: Four times a day (QID) | ORAL | 0 refills | Status: DC | PRN

## 2020-09-15 NOTE — Progress Notes (Signed)
Reviewed PDMP substance reporting system prior to prescribing opiate medications. No inconsistencies noted.  Meds ordered this encounter  Medications   Oxycodone HCl 10 MG TABS    Sig: Take 1 tablet (10 mg total) by mouth every 6 (six) hours as needed (pain).    Dispense:  60 tablet    Refill:  0    Order Specific Question:   Supervising Provider    Answer:   [name], [name]   Lindsey Siple Moore Jamy Whyte  APRN, MSN, FNP-C Patient Care [facility] [address]  [place], [place] [address] [phone]  

## 2020-09-16 ENCOUNTER — Other Ambulatory Visit: Payer: Self-pay | Admitting: Family Medicine

## 2020-09-16 ENCOUNTER — Telehealth: Payer: Self-pay

## 2020-09-16 DIAGNOSIS — G894 Chronic pain syndrome: Secondary | ICD-10-CM

## 2020-09-16 NOTE — Telephone Encounter (Signed)
Oxycodone 10mg

## 2020-09-28 ENCOUNTER — Telehealth: Payer: Self-pay

## 2020-09-28 NOTE — Telephone Encounter (Signed)
Med refill Oxycodone 10 mg Oxycontin  10 mg

## 2020-09-29 ENCOUNTER — Other Ambulatory Visit: Payer: Self-pay | Admitting: Family Medicine

## 2020-09-29 DIAGNOSIS — G894 Chronic pain syndrome: Secondary | ICD-10-CM

## 2020-09-29 MED ORDER — OXYCODONE HCL 10 MG PO TABS: 10.0000 mg | ORAL_TABLET | Freq: Four times a day (QID) | ORAL | 0 refills | Status: DC | PRN

## 2020-09-29 MED ORDER — OXYCODONE HCL ER 10 MG PO T12A: 10.0000 mg | EXTENDED_RELEASE_TABLET | Freq: Two times a day (BID) | ORAL | 0 refills | Status: DC

## 2020-09-29 NOTE — Progress Notes (Signed)
Reviewed PDMP substance reporting system prior to prescribing opiate medications. No inconsistencies noted.   Meds ordered this encounter  Medications  . Oxycodone HCl 10 MG TABS    Sig: Take 1 tablet (10 mg total) by mouth every 6 (six) hours as needed (pain).    Dispense:  60 tablet    Refill:  0    Order Specific Question:   Supervising Provider    Answer:   Quentin Angst [id number]  . oxyCODONE (OXYCONTIN) 10 mg 12 hr tablet    Sig: Take 1 tablet (10 mg total) by mouth every 12 (twelve) hours.    Dispense:  60 tablet    Refill:  0    Order Specific Question:   Supervising Provider    Answer:   Quentin Angst [[id number]]     [name]  APRN, MSN, FNP-C Patient Care [facility] [address] [place], [place] [address] [phone]

## 2020-09-30 ENCOUNTER — Telehealth: Payer: Self-pay

## 2020-09-30 NOTE — Telephone Encounter (Signed)
Med refill:  Oxycodone 10 mg oxycontin

## 2020-10-13 ENCOUNTER — Telehealth: Payer: Self-pay

## 2020-10-13 ENCOUNTER — Other Ambulatory Visit: Payer: Self-pay | Admitting: Family Medicine

## 2020-10-13 DIAGNOSIS — G894 Chronic pain syndrome: Secondary | ICD-10-CM

## 2020-10-13 MED ORDER — OXYCODONE HCL 10 MG PO TABS: 10.0000 mg | ORAL_TABLET | Freq: Four times a day (QID) | ORAL | 0 refills | Status: DC | PRN

## 2020-10-13 NOTE — Telephone Encounter (Signed)
Oxycodone 10mg

## 2020-10-13 NOTE — Progress Notes (Signed)
Reviewed PDMP substance reporting system prior to prescribing opiate medications. No inconsistencies noted.  Meds ordered this encounter  Medications   Oxycodone HCl 10 MG TABS    Sig: Take 1 tablet (10 mg total) by mouth every 6 (six) hours as needed (pain).    Dispense:  60 tablet    Refill:  0    Order Specific Question:   Supervising Provider    Answer:   [name], [name]   Arber Wiemers Moore Ishana Blades  APRN, MSN, FNP-C Patient Care [facility] [address]  [place], [place] [address] [phone]  

## 2020-10-25 DIAGNOSIS — Z20822 Contact with and (suspected) exposure to covid-19: Secondary | ICD-10-CM | POA: Diagnosis present

## 2020-10-25 DIAGNOSIS — Z79899 Other long term (current) drug therapy: Secondary | ICD-10-CM

## 2020-10-25 DIAGNOSIS — M79604 Pain in right leg: Secondary | ICD-10-CM | POA: Diagnosis present

## 2020-10-25 DIAGNOSIS — F17290 Nicotine dependence, other tobacco product, uncomplicated: Secondary | ICD-10-CM | POA: Diagnosis present

## 2020-10-25 DIAGNOSIS — Z9049 Acquired absence of other specified parts of digestive tract: Secondary | ICD-10-CM

## 2020-10-25 DIAGNOSIS — D5700 Hb-SS disease with crisis, unspecified: Principal | ICD-10-CM | POA: Diagnosis present

## 2020-10-25 DIAGNOSIS — D638 Anemia in other chronic diseases classified elsewhere: Secondary | ICD-10-CM | POA: Diagnosis present

## 2020-10-25 DIAGNOSIS — G894 Chronic pain syndrome: Secondary | ICD-10-CM | POA: Diagnosis present

## 2020-10-26 ENCOUNTER — Encounter (HOSPITAL_COMMUNITY): Payer: Self-pay

## 2020-10-26 ENCOUNTER — Inpatient Hospital Stay (HOSPITAL_COMMUNITY)
Admission: EM | Admit: 2020-10-26 | Discharge: 2020-10-28 | DRG: 812 | Disposition: A | Discharge status: Home / Self Care | Payer: Medicaid Other | Attending: Internal Medicine | Admitting: Internal Medicine

## 2020-10-26 ENCOUNTER — Emergency Department (HOSPITAL_COMMUNITY): Payer: Medicaid Other

## 2020-10-26 DIAGNOSIS — G894 Chronic pain syndrome: Secondary | ICD-10-CM

## 2020-10-26 DIAGNOSIS — Z9049 Acquired absence of other specified parts of digestive tract: Secondary | ICD-10-CM | POA: Diagnosis not present

## 2020-10-26 DIAGNOSIS — M79604 Pain in right leg: Secondary | ICD-10-CM | POA: Diagnosis present

## 2020-10-26 DIAGNOSIS — Z20822 Contact with and (suspected) exposure to covid-19: Secondary | ICD-10-CM | POA: Diagnosis present

## 2020-10-26 DIAGNOSIS — F17290 Nicotine dependence, other tobacco product, uncomplicated: Secondary | ICD-10-CM | POA: Diagnosis present

## 2020-10-26 DIAGNOSIS — D638 Anemia in other chronic diseases classified elsewhere: Secondary | ICD-10-CM | POA: Diagnosis present

## 2020-10-26 DIAGNOSIS — D5700 Hb-SS disease with crisis, unspecified: Secondary | ICD-10-CM | POA: Diagnosis present

## 2020-10-26 DIAGNOSIS — G8929 Other chronic pain: Secondary | ICD-10-CM | POA: Diagnosis present

## 2020-10-26 DIAGNOSIS — D571 Sickle-cell disease without crisis: Secondary | ICD-10-CM | POA: Diagnosis present

## 2020-10-26 DIAGNOSIS — Z79899 Other long term (current) drug therapy: Secondary | ICD-10-CM | POA: Diagnosis not present

## 2020-10-26 LAB — COMPREHENSIVE METABOLIC PANEL
ALT: 13 U/L (ref 0–44)
AST: 19 U/L (ref 15–41)
Albumin: 4.7 g/dL (ref 3.5–5.0)
Alkaline Phosphatase: 40 U/L (ref 38–126)
Anion gap: 7 (ref 5–15)
BUN: 12 mg/dL (ref 6–20)
CO2: 27 mmol/L (ref 22–32)
Calcium: 9.4 mg/dL (ref 8.9–10.3)
Chloride: 107 mmol/L (ref 98–111)
Creatinine, Ser: 0.94 mg/dL (ref 0.44–1.00)
GFR, Estimated: >60 mL/min (ref >60)
Glucose, Bld: 103 mg/dL — ABNORMAL HIGH (ref 70–99)
Potassium: 4.2 mmol/L (ref 3.5–5.1)
Sodium: 141 mmol/L (ref 135–145)
Total Bilirubin: 1.1 mg/dL (ref 0.3–1.2)
Total Protein: 7.8 g/dL (ref 6.5–8.1)

## 2020-10-26 LAB — CBC WITH DIFFERENTIAL/PLATELET
Band Neutrophils: 0 %
Basophils Relative: 0 %
Blasts: NONE SEEN %
Eosinophils Relative: 4 %
HCT: 28.1 % — ABNORMAL LOW (ref 36.0–46.0)
Hemoglobin: 10.4 g/dL — ABNORMAL LOW (ref 12.0–15.0)
Lymphocytes Relative: 52 %
MCH: 32 pg (ref 26.0–34.0)
MCHC: 37 g/dL — ABNORMAL HIGH (ref 30.0–36.0)
MCV: 86.5 fL (ref 80.0–100.0)
Metamyelocytes Relative: NONE SEEN %
Monocytes Relative: 2 %
Myelocytes: NONE SEEN %
Neutrophils Relative %: 42 %
Platelets: 393 10*3/uL (ref 150–400)
Promyelocytes Relative: NONE SEEN %
RBC: 3.25 MIL/uL — ABNORMAL LOW (ref 3.87–5.11)
RDW: 14.1 % (ref 11.5–15.5)
WBC Morphology: NORMAL
WBC: 11.5 10*3/uL — ABNORMAL HIGH (ref 4.0–10.5)
nRBC: 0.3 % — ABNORMAL HIGH (ref 0.0–0.2)
nRBC: 1 /100 WBC — ABNORMAL HIGH

## 2020-10-26 LAB — RETICULOCYTES
Immature Retic Fract: 23.8 % — ABNORMAL HIGH (ref 2.3–15.9)
RBC.: 3.25 MIL/uL — ABNORMAL LOW (ref 3.87–5.11)
Retic Count, Absolute: 102.7 10*3/uL (ref 19.0–186.0)
Retic Ct Pct: 3.2 % — ABNORMAL HIGH (ref 0.4–3.1)

## 2020-10-26 LAB — I-STAT BETA HCG BLOOD, ED (MC, WL, AP ONLY): I-stat hCG, quantitative: <5 m[IU]/mL (ref <5)

## 2020-10-26 LAB — RESP PANEL BY RT-PCR (FLU A&B, COVID) ARPGX2
Influenza A by PCR: NEGATIVE
Influenza B by PCR: NEGATIVE
SARS Coronavirus 2 by RT PCR: NEGATIVE

## 2020-10-26 MED ORDER — SODIUM CHLORIDE 0.9 % IV BOLUS
1000.0000 mL | Freq: Once | INTRAVENOUS | Status: AC
  Administered 2020-10-26: 1000 mL via INTRAVENOUS

## 2020-10-26 MED ORDER — GABAPENTIN 300 MG PO CAPS
300.0000 mg | ORAL_CAPSULE | Freq: Three times a day (TID) | ORAL | Status: DC
  Administered 2020-10-26 – 2020-10-28 (×7): 300 mg via ORAL
  Filled 2020-10-26 (×7): qty 1

## 2020-10-26 MED ORDER — ONDANSETRON HCL 4 MG PO TABS: 4.0000 mg | ORAL_TABLET | ORAL | Status: DC | PRN

## 2020-10-26 MED ORDER — KETOROLAC TROMETHAMINE 15 MG/ML IJ SOLN
15.0000 mg | Freq: Once | INTRAMUSCULAR | Status: AC
  Administered 2020-10-26: 15 mg via INTRAVENOUS
  Filled 2020-10-26: qty 1

## 2020-10-26 MED ORDER — ONDANSETRON HCL 4 MG/2ML IJ SOLN: 4.0000 mg | Freq: Four times a day (QID) | INTRAMUSCULAR | Status: DC | PRN

## 2020-10-26 MED ORDER — SENNOSIDES-DOCUSATE SODIUM 8.6-50 MG PO TABS
1.0000 | ORAL_TABLET | Freq: Two times a day (BID) | ORAL | Status: DC
  Administered 2020-10-26 – 2020-10-28 (×5): 1 via ORAL
  Filled 2020-10-26 (×5): qty 1

## 2020-10-26 MED ORDER — OXYCODONE HCL ER 10 MG PO T12A
10.0000 mg | EXTENDED_RELEASE_TABLET | Freq: Two times a day (BID) | ORAL | Status: DC
  Administered 2020-10-26 – 2020-10-28 (×5): 10 mg via ORAL
  Filled 2020-10-26 (×5): qty 1

## 2020-10-26 MED ORDER — ONDANSETRON HCL 4 MG/2ML IJ SOLN
4.0000 mg | INTRAMUSCULAR | Status: DC | PRN
  Administered 2020-10-26: 4 mg via INTRAVENOUS
  Filled 2020-10-26: qty 2

## 2020-10-26 MED ORDER — DIPHENHYDRAMINE HCL 25 MG PO CAPS: 25.0000 mg | ORAL_CAPSULE | ORAL | Status: DC | PRN

## 2020-10-26 MED ORDER — HYDROMORPHONE HCL 1 MG/ML IJ SOLN
1.0000 mg | INTRAMUSCULAR | Status: DC | PRN
Start: 2020-10-26 — End: 2020-10-26
  Administered 2020-10-26: 1 mg via INTRAVENOUS
  Filled 2020-10-26: qty 1

## 2020-10-26 MED ORDER — HYDROMORPHONE HCL 2 MG/ML IJ SOLN
2.0000 mg | INTRAMUSCULAR | Status: AC
  Administered 2020-10-26 (×3): 2 mg via INTRAVENOUS
  Filled 2020-10-26 (×3): qty 1

## 2020-10-26 MED ORDER — LACTATED RINGERS IV SOLN: INTRAVENOUS | Status: DC

## 2020-10-26 MED ORDER — SODIUM CHLORIDE 0.9 % IV SOLN
25.0000 mg | INTRAVENOUS | Status: DC | PRN
  Filled 2020-10-26: qty 0.5

## 2020-10-26 MED ORDER — ALBUTEROL SULFATE HFA 108 (90 BASE) MCG/ACT IN AERS: 2.0000 | INHALATION_SPRAY | Freq: Four times a day (QID) | RESPIRATORY_TRACT | Status: DC | PRN

## 2020-10-26 MED ORDER — KETOROLAC TROMETHAMINE 30 MG/ML IJ SOLN
15.0000 mg | Freq: Four times a day (QID) | INTRAMUSCULAR | Status: DC
  Administered 2020-10-26 – 2020-10-28 (×8): 15 mg via INTRAVENOUS
  Filled 2020-10-26 (×7): qty 1

## 2020-10-26 MED ORDER — SODIUM CHLORIDE 0.9% FLUSH: 9.0000 mL | INTRAVENOUS | Status: DC | PRN

## 2020-10-26 MED ORDER — OXYCODONE HCL 5 MG PO TABS
10.0000 mg | ORAL_TABLET | Freq: Four times a day (QID) | ORAL | Status: DC | PRN
  Administered 2020-10-26: 10 mg via ORAL
  Filled 2020-10-26: qty 2

## 2020-10-26 MED ORDER — MELATONIN 5 MG PO TABS
10.0000 mg | ORAL_TABLET | Freq: Every day | ORAL | Status: DC
  Administered 2020-10-26 – 2020-10-27 (×2): 10 mg via ORAL
  Filled 2020-10-26 (×2): qty 2

## 2020-10-26 MED ORDER — SODIUM CHLORIDE 0.45 % IV SOLN: INTRAVENOUS | Status: DC

## 2020-10-26 MED ORDER — KETOROLAC TROMETHAMINE 30 MG/ML IJ SOLN: 30.0000 mg | Freq: Four times a day (QID) | INTRAMUSCULAR | Status: DC

## 2020-10-26 MED ORDER — POLYETHYLENE GLYCOL 3350 17 G PO PACK: 17.0000 g | PACK | Freq: Every day | ORAL | Status: DC | PRN

## 2020-10-26 MED ORDER — ACETAMINOPHEN 325 MG PO TABS: 650.0000 mg | ORAL_TABLET | Freq: Four times a day (QID) | ORAL | Status: DC | PRN

## 2020-10-26 MED ORDER — ALBUTEROL SULFATE (2.5 MG/3ML) 0.083% IN NEBU: 2.5000 mg | INHALATION_SOLUTION | Freq: Four times a day (QID) | RESPIRATORY_TRACT | Status: DC | PRN

## 2020-10-26 MED ORDER — NALOXONE HCL 0.4 MG/ML IJ SOLN: 0.4000 mg | INTRAMUSCULAR | Status: DC | PRN

## 2020-10-26 MED ORDER — HYDROMORPHONE 1 MG/ML IV SOLN
INTRAVENOUS | Status: DC
  Administered 2020-10-26: 7.5 mg via INTRAVENOUS
  Administered 2020-10-26: 30 mg via INTRAVENOUS
  Administered 2020-10-26: 3 mg via INTRAVENOUS
  Administered 2020-10-26: 3.5 mg via INTRAVENOUS
  Administered 2020-10-27: 6 mg via INTRAVENOUS
  Administered 2020-10-27: 6.5 mg via INTRAVENOUS
  Administered 2020-10-27: 2 mg via INTRAVENOUS
  Administered 2020-10-27: 30 mg via INTRAVENOUS
  Filled 2020-10-26 (×2): qty 30

## 2020-10-26 MED ORDER — KETOROLAC TROMETHAMINE 30 MG/ML IJ SOLN
30.0000 mg | Freq: Three times a day (TID) | INTRAMUSCULAR | Status: DC | PRN
  Administered 2020-10-26: 30 mg via INTRAVENOUS
  Filled 2020-10-26: qty 1

## 2020-10-26 MED ORDER — DIPHENHYDRAMINE HCL 25 MG PO CAPS
25.0000 mg | ORAL_CAPSULE | ORAL | Status: DC | PRN
  Administered 2020-10-26: 25 mg via ORAL
  Filled 2020-10-26: qty 1

## 2020-10-26 NOTE — ED Provider Notes (Signed)
[facility] Emergency Department Provider Note MRN:  [id number]  Arrival date & time: 10/26/20     Chief Complaint   Sickle Cell Pain Crisis   History of Present Illness   Lindsey [name] is a 50 y.o. year-old female with a history of sickle cell pain crisis presenting to the ED with chief complaint of leg pain.  Pain is located in the right tib-fib region and calf.  Present for the past 1 or 2 days, pain is severe, constant, similar to prior episodes of sickle cell pain crisis.  Denies any chest pain or shortness of breath, no fever recently, no other complaints.  Pain is currently 10 out of 10, patient is in tears.  Review of Systems  A complete 10 system review of systems was obtained and all systems are negative except as noted in the HPI and PMH.   Patient's Health History    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   Opioid dependence in remission (HCC)    Sickle cell crisis (HCC)     Past Surgical History:  Procedure Laterality Date   APPENDECTOMY     CESAREAN SECTION     OTHER SURGICAL HISTORY     c-section    Family History  Problem Relation Age of Onset   Stroke Neg Hx        none that she knows of    Seizures Neg Hx     Social History   Socioeconomic History   Marital status: Married    Spouse name: Not on file   Number of children: 2   Years of education: Not on file   Highest education level: Not on file  Occupational History   Not on file  Tobacco Use   Smoking status: Every Day    Packs/day: 0.00    Pack years: 0.00    Types: Cigars, Cigarettes   Smokeless tobacco: Never  Vaping Use   Vaping Use: Former  Substance and Sexual Activity   Alcohol use: Yes    Comment: occasionally   Drug use: No   Sexual activity: Yes    Birth control/protection: None  Other Topics Concern   Not on file  Social History Narrative   Lives at home with husband and son   Right handed   Social  Determinants of Health   Financial Resource Strain: Not on file  Food Insecurity: Not on file  Transportation Needs: Not on file  Physical Activity: Not on file  Stress: Not on file  Social Connections: Not on file  Intimate Partner Violence: Not on file     Physical Exam   Vitals:   10/26/20 0230 10/26/20 0245  BP: 96/64 (!) 97/58  Pulse: 71 70  Resp: 15 14  SpO2: 98% 98%    CONSTITUTIONAL: Well-appearing, NAD NEURO:  Alert and oriented x 3, no focal deficits EYES:  eyes equal and reactive ENT/NECK:  no LAD, no JVD CARDIO: Regular rate, well-perfused, normal S1 and S2 PULM:  CTAB no wheezing or rhonchi GI/GU:  normal bowel sounds, non-distended, non-tender MSK/SPINE:  No gross deformities, no edema SKIN:  no rash, atraumatic PSYCH:  Appropriate speech and behavior  *Additional and/or pertinent findings included in MDM below  Diagnostic and Interventional Summary    EKG Interpretation  Date/Time:    Ventricular Rate:    PR Interval:    QRS Duration:   QT Interval:    QTC Calculation:   R Axis:  Text Interpretation:          Labs Reviewed  COMPREHENSIVE METABOLIC PANEL - Abnormal; Notable for the following components:      Result Value   Glucose, Bld 103 (*)    All other components within normal limits  CBC WITH DIFFERENTIAL/PLATELET - Abnormal; Notable for the following components:   WBC 11.5 (*)    RBC 3.25 (*)    Hemoglobin 10.4 (*)    HCT 28.1 (*)    MCHC 37.0 (*)    nRBC 0.3 (*)    nRBC 1 (*)    All other components within normal limits  RETICULOCYTES - Abnormal; Notable for the following components:   Retic Ct Pct 3.2 (*)    RBC. 3.25 (*)    Immature Retic Fract 23.8 (*)    All other components within normal limits  RESP PANEL BY RT-PCR (FLU A&B, COVID) ARPGX2  I-STAT BETA HCG BLOOD, ED ([facility], [facility], [facility] ONLY)    DG Tibia/Fibula Right  Final Result      Medications  HYDROmorphone (DILAUDID) injection 2 mg (2 mg Intravenous Given 10/26/20  0345)  ketorolac (TORADOL) 15 MG/ML injection 15 mg (15 mg Intravenous Given 10/26/20 0116)  sodium chloride 0.9 % bolus 1,000 mL (1,000 mLs Intravenous New Bag/Given 10/26/20 0131)     Procedures  /  Critical Care Procedures  ED Course and Medical Decision Making  I have reviewed the triage vital signs, the nursing notes, and pertinent available records from the EMR.  Listed above are laboratory and imaging tests that I personally ordered, reviewed, and interpreted and then considered in my medical decision making (see below for details).  The leg appears normal, no edema, no erythema, neurovascularly intact, do not see any evidence of infection.  Suspect vaso-occlusive crisis in the setting of sickle cell anemia, also considering bony infarct or osteonecrosis.  Awaiting labs, x-ray, providing pain control and will reassess.     Patient with continued significant pain despite 3 rounds of Dilaudid 2 mg.  Labs overall reassuring.  X-ray is normal.  Will admit to medicine for pain control.  Elmer Sow. Pilar Plate, MD [facility] Emergency Medicine [facility] [email or web address]  Final Clinical Impressions(s) / ED Diagnoses     ICD-10-CM   1. Sickle cell pain crisis [facility])  D57.00       ED Discharge Orders     None        Discharge Instructions Discussed with and Provided to Patient:   Discharge Instructions   None       Sabas Sous, MD 10/26/20 [phone]

## 2020-10-26 NOTE — ED Triage Notes (Signed)
Pt to Ed with c/o SCC. Pt states this began yesterday but went away, it came back even worse today. Pt has taken her prescribed pain meds with no relief.

## 2020-10-26 NOTE — Progress Notes (Addendum)
[name] Barman is a 50 year old female with a medical history significant for sickle cell disease, chronic pain syndrome, opiate dependence and tolerance that was admitted overnight for sickle cell pain crisis.  Patient continues to have increased pain primarily to left lower extremity.  She attributes current pain crisis to being outside for a long period of time on yesterday during extremely hot temperatures.  She says that pain intensity is 9/10 and has not been controlled on current medication regimen. She denies headache, chest pain, urinary symptoms, nausea, vomiting, or diarrhea.  Care plan: Discontinue lactated Ringer's.  Add 0.45% saline at 100 mL/h Discontinue IV Dilaudid pushes.  Initiate PCA, opiate tolerant.  Settings of 0.5 mg, 10-minute lockout, and 3 mg/h with a 1 mg loading dose. Decrease Toradol to 15 mg every 6 hours for total of 5 days Restart home medications.  OxyContin 10 mg every 12 hours.  Hold oxycodone, use PCA for right now.  We will introduce oxycodone as pain intensity improves.  Will reassess in a.m.  [name]  APRN, MSN, FNP-C Patient Care [facility] [address] [place], [place] [address] [phone]

## 2020-10-26 NOTE — H&P (Signed)
History and Physical    Lindsey [name] HYQ:[id number] DOB: [birth year] DOA: 10/26/2020  PCP: Massie Maroon, FNP   Patient coming from: Home  Chief Complaint: Right leg pain  HPI: Lindsey [name] is a 50 y.o. female with medical history significant for sickle cell disease who presents with 1-2 days of right lower leg pain. She has not had any injury or trauma to her leg. States that pain feels similar to previous pain crisis she has had in the past.  She has not had any fever or chills.  She denies any chest pain or shortness of breath associated with her leg pain.  She states she does not do any abnormal activities except for she helped her mother blowup a swimming pool for her young cousin a few days ago.  She reports the pain is a 10 out of 10 at its worst.  Her normal pain medication at home was not relieving the pain.  IV pain medication emergency room has helped she states.  ED Course:  [name] has been hemodynamically stable in the ER. She has required multiple doses of pain medication to control her pain. No signs of infection. CBC and CMP are unremarkable. Hospitalist service has been asked to admit patient for further management.   Review of Systems:  General: Denies fever, chills, weight loss, night sweats.  Denies dizziness.  Denies change in appetite HENT: Denies head trauma, headache, denies change in hearing, tinnitus.  Denies nasal congestion.  Denies sore throat, Denies difficulty swallowing Eyes: Denies blurry vision, pain in eye, drainage.  Denies discoloration of eyes. Neck: Denies pain.  Denies swelling.  Denies pain with movement. Cardiovascular: Denies chest pain, palpitations.  Denies edema.  Denies orthopnea Respiratory: Denies shortness of breath, cough.  Denies wheezing.  Denies sputum production Gastrointestinal: Denies abdominal pain, swelling.  Denies nausea, vomiting, diarrhea.  Denies melena.  Denies hematemesis. Musculoskeletal: Reports severe pain in right  leg. Denies limitation of movement.  Denies deformity or swelling.   Genitourinary: Denies pelvic pain.  Denies urinary frequency or hesitancy.  Denies dysuria.  Skin: Denies rash.  Denies petechiae, purpura, ecchymosis. Neurological: Denies headache.  Denies syncope.  Denies seizure activity.  Denies paresthesia.  Denies slurred speech, drooping face.  Denies visual change. Psychiatric: Denies depression, anxiety. Denies hallucinations.  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   Opioid dependence in remission (HCC)    Sickle cell crisis [facility])     Past Surgical History:  Procedure Laterality Date   APPENDECTOMY     CESAREAN SECTION     OTHER SURGICAL HISTORY     c-section    Social History  reports that she has been smoking cigars. She has never used smokeless tobacco. She reports current alcohol use. She reports that she does not use drugs.  No Known Allergies  Family History  Problem Relation Age of Onset   Stroke Neg Hx        none that she knows of    Seizures Neg Hx      Prior to Admission medications   Medication Sig Start Date End Date Taking? Authorizing Provider  albuterol (VENTOLIN HFA) 108 (90 Base) MCG/ACT inhaler INHALE 2 PUFFS INTO THE LUNGS EVERY 6 HOURS AS NEEDED FOR WHEEZING OR SHORTNESS OF BREATH Patient taking differently: Inhale 2 puffs into the lungs every 6 (six) hours as needed. 06/01/20  Yes Massie Maroon, FNP  gabapentin (NEURONTIN) 300 MG capsule Take 1 capsule (300  mg total) by mouth 3 (three) times daily. 08/31/20  Yes Massie Maroon, FNP  ibuprofen (ADVIL) 800 MG tablet TAKE 1 TABLET(800 MG) BY MOUTH EVERY 8 HOURS AS NEEDED Patient taking differently: Take 800 mg by mouth every 8 (eight) hours as needed for moderate pain. 10/14/19  Yes Massie Maroon, FNP  Melatonin 10 MG TABS Take 10 mg by mouth at bedtime.   Yes [provider]  oxyCODONE (OXYCONTIN) 10 mg 12 hr tablet Take 1 tablet (10 mg  total) by mouth every 12 (twelve) hours. 10/01/20 10/31/20 Yes Massie Maroon, FNP  Oxycodone HCl 10 MG TABS Take 1 tablet (10 mg total) by mouth every 6 (six) hours as needed (pain). 10/15/20  Yes Massie Maroon, FNP  DULoxetine (CYMBALTA) 20 MG capsule Take 1 capsule (20 mg total) by mouth daily. Patient not taking: No sig reported 07/21/20 07/21/21  Massie Maroon, FNP  ergocalciferol (VITAMIN D2) 1.25 MG (50000 UT) capsule Take 1 capsule (50,000 Units total) by mouth once a week. 10/16/19   Massie Maroon, FNP    Physical Exam: Vitals:   10/26/20 0230 10/26/20 0245 10/26/20 0400 10/26/20 0500  BP: 96/64 (!) 97/58 105/80 105/67  Pulse: 71 70 74 73  Resp: 15 14 15 16   SpO2: 98% 98% 97% 97%  Weight:      Height:        Constitutional: NAD, calm, comfortable Vitals:   10/26/20 0230 10/26/20 0245 10/26/20 0400 10/26/20 0500  BP: 96/64 (!) 97/58 105/80 105/67  Pulse: 71 70 74 73  Resp: 15 14 15 16   SpO2: 98% 98% 97% 97%  Weight:      Height:       General: WDWN, Alert and oriented x3.  Eyes: EOMI, PERRL, conjunctivae normal.  Sclera nonicteric HENT:  [place]/AT, external ears normal.  Nares patent without epistasis.  Mucous membranes are moist. Normal dentition.  Neck: Soft, normal range of motion, supple, no masses,Trachea midline Respiratory: clear to auscultation bilaterally, no wheezing, no crackles. Normal respiratory effort. No accessory muscle use.  Cardiovascular: Regular rate and rhythm, no murmurs / rubs / gallops. No extremity edema. 2+ pedal pulses.   Abdomen: Soft, no tenderness, nondistended, no rebound or guarding.  No masses palpated. Bowel sounds normoactive Musculoskeletal: FROM. Has pain of right leg, no deformity or swelling. no cyanosis. No joint deformity or effusion in upper and lower extremities. Normal muscle tone.  Skin: Warm, dry, intact no rashes, lesions, ulcers. No induration Neurologic: CN 2-12 grossly intact.  Normal speech.  Sensation intact.  Strength 5/5 in all extremities.   Psychiatric: Normal judgment and insight.  Normal mood.    Labs on Admission: I have personally reviewed following labs and imaging studies  CBC: Recent Labs  Lab 10/26/20 0051  WBC 11.5*  HGB 10.4*  HCT 28.1*  MCV 86.5  PLT 393    Basic Metabolic Panel: Recent Labs  Lab 10/26/20 0051  NA 141  K 4.2  CL 107  CO2 27  GLUCOSE 103*  BUN 12  CREATININE 0.94  CALCIUM 9.4    GFR: Estimated Creatinine Clearance: 62 mL/min (by C-G formula based on SCr of 0.94 mg/dL).  Liver Function Tests: Recent Labs  Lab 10/26/20 0051  AST 19  ALT 13  ALKPHOS 40  BILITOT 1.1  PROT 7.8  ALBUMIN 4.7    Urine analysis:    Component Value Date/Time   COLORURINE YELLOW 10/24/2019 0230   APPEARANCEUR CLEAR 10/24/2019 0230  LABSPEC 1.016 10/24/2019 0230   PHURINE 5.0 10/24/2019 0230   GLUCOSEU NEGATIVE 10/24/2019 0230   HGBUR NEGATIVE 10/24/2019 0230   BILIRUBINUR neg 04/07/2020 1014   KETONESUR NEGATIVE 10/24/2019 0230   PROTEINUR Negative 04/07/2020 1014   PROTEINUR NEGATIVE 10/24/2019 0230   UROBILINOGEN 0.2 04/07/2020 1014   UROBILINOGEN 1.0 12/25/2012 1755   NITRITE neg 04/07/2020 1014   NITRITE NEGATIVE 10/24/2019 0230   LEUKOCYTESUR Small (1+) (A) 04/07/2020 1014   LEUKOCYTESUR TRACE (A) 10/24/2019 0230    Radiological Exams on Admission: DG Tibia/Fibula Right  Result Date: 10/26/2020 CLINICAL DATA:  Osteonecrosis versus infarct.  Sickle cell pain EXAM: RIGHT TIBIA AND FIBULA - 2 VIEW COMPARISON:  None. FINDINGS: There is no evidence of fracture or other focal bone lesions. Soft tissues are unremarkable. IMPRESSION: Negative. Electronically Signed   By: Signa Kell M.D.   On: 10/26/2020 01:22     Assessment/Plan Principal Problem:   Sickle cell pain crisis  Ms. Fornes is admitted to Med-Surg floor.  Pain control provided with toradol for moderate pain as needed, home oxycodone dose continued. Dilaudid provided for severe  pain.  IVF hydration with LR at 75 ml/hr. Continue home gabapentin dose.  Ambulation prn.      DVT prophylaxis: Padua score low. Ambulation for DVT prophylaxis.   Code Status:   Full Code  Family Communication:  Diagnosis and plan discussed with patient.  Patient verbalized understanding and agrees with plan.  Further recommendations to follow as clinically indicated Disposition Plan:   Patient is from:  Home  Anticipated DC to:  Home  Anticipated DC date:  Anticipate 2 midnight or more stay in hospital  Anticipated DC barriers: Barriers to discharge identified at this time   Admission status:  Inpatient   Claudean Severance Jailyn Leeson MD [facility]  How to contact the [facility] Attending or Consulting provider 7A - 7P or covering provider during after hours 7P -7A, for this patient?   Check the care team in [facility] and look for a) attending/consulting TR[facility] provider listed and b) the [facility] team listed Log into [email or web address] and use [place]'s universal password to access. If you do not have the password, please contact the hospital operator. Locate the [facility] provider you are looking for under [facility] and page to a number that you can be directly reached. If you still have difficulty reaching the provider, please page the [facility] (Director on Call) for the Hospitalists listed on amion for assistance.  10/26/2020, 6:19 AM

## 2020-10-27 ENCOUNTER — Other Ambulatory Visit: Payer: Self-pay

## 2020-10-27 ENCOUNTER — Ambulatory Visit: Payer: Medicaid Other | Admitting: Family Medicine

## 2020-10-27 ENCOUNTER — Encounter (HOSPITAL_COMMUNITY): Payer: Self-pay | Admitting: Family Medicine

## 2020-10-27 DIAGNOSIS — D5700 Hb-SS disease with crisis, unspecified: Principal | ICD-10-CM

## 2020-10-27 LAB — CBC
HCT: 22.1 % — ABNORMAL LOW (ref 36.0–46.0)
Hemoglobin: 8.2 g/dL — ABNORMAL LOW (ref 12.0–15.0)
MCH: 32.4 pg (ref 26.0–34.0)
MCHC: 37.1 g/dL — ABNORMAL HIGH (ref 30.0–36.0)
MCV: 87.4 fL (ref 80.0–100.0)
Platelets: 263 10*3/uL (ref 150–400)
RBC: 2.53 MIL/uL — ABNORMAL LOW (ref 3.87–5.11)
RDW: 14.5 % (ref 11.5–15.5)
WBC: 12.4 10*3/uL — ABNORMAL HIGH (ref 4.0–10.5)
nRBC: 0.3 % — ABNORMAL HIGH (ref 0.0–0.2)

## 2020-10-27 LAB — BASIC METABOLIC PANEL
Anion gap: 3 — ABNORMAL LOW (ref 5–15)
BUN: 11 mg/dL (ref 6–20)
CO2: 25 mmol/L (ref 22–32)
Calcium: 8 mg/dL — ABNORMAL LOW (ref 8.9–10.3)
Chloride: 107 mmol/L (ref 98–111)
Creatinine, Ser: 0.73 mg/dL (ref 0.44–1.00)
GFR, Estimated: >60 mL/min (ref >60)
Glucose, Bld: 98 mg/dL (ref 70–99)
Potassium: 4 mmol/L (ref 3.5–5.1)
Sodium: 135 mmol/L (ref 135–145)

## 2020-10-27 MED ORDER — OXYCODONE HCL 5 MG PO TABS
10.0000 mg | ORAL_TABLET | ORAL | Status: DC | PRN
  Administered 2020-10-27 – 2020-10-28 (×3): 10 mg via ORAL
  Filled 2020-10-27 (×3): qty 2

## 2020-10-27 MED ORDER — HYDROMORPHONE 1 MG/ML IV SOLN
INTRAVENOUS | Status: DC
  Administered 2020-10-27: 3.5 mg via INTRAVENOUS
  Administered 2020-10-27: 7 mg via INTRAVENOUS
  Administered 2020-10-28: 0.5 mg via INTRAVENOUS
  Administered 2020-10-28: 2 mg via INTRAVENOUS
  Administered 2020-10-28 (×2): 3 mg via INTRAVENOUS

## 2020-10-27 NOTE — Progress Notes (Signed)
Subjective: Lindsey [name] is a 50 year old female with a medical history significant for sickle cell disease, chronic pain syndrome, opiate dependence and tolerance, and history of anemia of chronic disease that was admitted for sickle cell pain crisis. Today, patient states that she does not feel well, she continues to have pain primarily to right lower extremity.  She rates her pain as 7/10.  She denies any headache, chest pain, urinary symptoms, nausea, vomiting, or diarrhea.  Objective:  Vital signs in last 24 hours:  Vitals:   10/28/20 0504 10/28/20 0745 10/28/20 1009 10/28/20 1204  BP: 96/63  107/74   Pulse: 73  67   Resp: 14 16 15 14   Temp: 98.6 F (37 C)  98.9 F (37.2 C)   TempSrc: Oral  Oral   SpO2: 93% 94% 96% 98%  Weight:      Height:        Intake/Output from previous day:  No intake or output data in the 24 hours ending 10/29/20 1523  Physical Exam: General: Alert, awake, oriented x3, in no acute distress.  HEENT: [place]/AT PEERL, EOMI Neck: Trachea midline,  no masses, no thyromegal,y no JVD, no carotid bruit OROPHARYNX:  Moist, No exudate/ erythema/lesions.  Heart: Regular rate and rhythm, without murmurs, rubs, gallops, PMI non-displaced, no heaves or thrills on palpation.  Lungs: Clear to auscultation, no wheezing or rhonchi noted. No increased vocal fremitus resonant to percussion  Abdomen: Soft, nontender, nondistended, positive bowel sounds, no masses no hepatosplenomegaly noted..  Neuro: No focal neurological deficits noted cranial nerves II through XII grossly intact. DTRs 2+ bilaterally upper and lower extremities. Strength 5 out of 5 in bilateral upper and lower extremities. Musculoskeletal: No warm swelling or erythema around joints, no spinal tenderness noted. Psychiatric: Patient alert and oriented x3, good insight and cognition, good recent to remote recall. Lymph node survey: No cervical axillary or inguinal lymphadenopathy noted.  Lab  Results:  Basic Metabolic Panel:    Component Value Date/Time   NA 135 10/27/2020 0606   NA 140 01/07/2020 1127   K 4.0 10/27/2020 0606   CL 107 10/27/2020 0606   CO2 25 10/27/2020 0606   BUN 11 10/27/2020 0606   BUN 12 01/07/2020 1127   CREATININE 0.73 10/27/2020 0606   GLUCOSE 98 10/27/2020 0606   CALCIUM 8.0 (L) 10/27/2020 0606   CBC:    Component Value Date/Time   WBC 11.3 (H) 10/28/2020 0519   HGB 8.7 (L) 10/28/2020 0519   HGB 11.5 01/07/2020 1127   HCT 23.0 (L) 10/28/2020 0519   HCT 34.3 01/07/2020 1127   PLT 331 10/28/2020 0519   PLT 436 01/07/2020 1127   MCV 86.5 10/28/2020 0519   MCV 96 01/07/2020 1127   NEUTROABS 9.7 (H) 08/29/2020 1117   NEUTROABS 7.6 (H) 01/07/2020 1127   LYMPHSABS 1.9 08/29/2020 1117   LYMPHSABS 2.0 01/07/2020 1127   MONOABS 0.8 08/29/2020 1117   EOSABS 0.5 08/29/2020 1117   EOSABS 0.4 01/07/2020 1127   BASOSABS 0.0 08/29/2020 1117   BASOSABS 0.1 01/07/2020 1127    Recent Results (from the past 240 hour(s))  Resp Panel by RT-PCR (Flu A&B, Covid) Nasopharyngeal Swab     Status: None   Collection Time: 10/26/20  5:02 AM   Specimen: Nasopharyngeal Swab; Nasopharyngeal(NP) swabs in vial transport medium  Result Value Ref Range Status   SARS Coronavirus 2 by RT PCR NEGATIVE NEGATIVE Final    Comment: (NOTE) SARS-CoV-2 target nucleic acids are NOT DETECTED.  The SARS-CoV-2 RNA  is generally detectable in upper respiratory specimens during the acute phase of infection. The lowest concentration of SARS-CoV-2 viral copies this assay can detect is 138 copies/mL. A negative result does not preclude SARS-Cov-2 infection and should not be used as the sole basis for treatment or other patient management decisions. A negative result may occur with  improper specimen collection/handling, submission of specimen other than nasopharyngeal swab, presence of viral mutation(s) within the areas targeted by this assay, and inadequate number of  viral copies(<138 copies/mL). A negative result must be combined with clinical observations, patient history, and epidemiological information. The expected result is Negative.  Fact Sheet for Patients:  BloggerCourse.com  Fact Sheet for Healthcare Providers:  SeriousBroker.it  This test is no t yet approved or cleared by the [place] FDA and  has been authorized for detection and/or diagnosis of SARS-CoV-2 by FDA under an Emergency Use Authorization (EUA). This EUA will remain  in effect (meaning this test can be used) for the duration of the COVID-19 declaration under Section 564(b)(1) of the Act, 21 U.S.C.section 360bbb-3(b)(1), unless the authorization is terminated  or revoked sooner.       Influenza A by PCR NEGATIVE NEGATIVE Final   Influenza B by PCR NEGATIVE NEGATIVE Final    Comment: (NOTE) The Xpert Xpress SARS-CoV-2/FLU/RSV plus assay is intended as an aid in the diagnosis of influenza from Nasopharyngeal swab specimens and should not be used as a sole basis for treatment. Nasal washings and aspirates are unacceptable for Xpert Xpress SARS-CoV-2/FLU/RSV testing.  Fact Sheet for Patients: BloggerCourse.com  Fact Sheet for Healthcare Providers: SeriousBroker.it  This test is not yet approved or cleared by the [place] FDA and has been authorized for detection and/or diagnosis of SARS-CoV-2 by FDA under an Emergency Use Authorization (EUA). This EUA will remain in effect (meaning this test can be used) for the duration of the COVID-19 declaration under Section 564(b)(1) of the Act, 21 U.S.C. section 360bbb-3(b)(1), unless the authorization is terminated or revoked.  Performed at [facility], [address], [place], [place] [address]     Studies/Results: No results found.  Medications: Scheduled Meds: Continuous Infusions: PRN  Meds:.  Consultants: None  Procedures: None  Antibiotics: None  Assessment/Plan: Principal Problem:   Sickle cell pain crisis (HCC)  Sickle cell disease with pain crisis: Decrease IV fluids to KVO Continue IV Dilaudid PCA Toradol 15 mg IV every 6 hours OxyContin 10 mg every 12 hours Oxycodone 10 mg every 6 hours as needed for severe breakthrough pain Monitor vital signs very closely, reevaluate pain scale regularly, and supplemental oxygen as needed  Sickle cell anemia: Patient's hemoglobin is stable and consistent with her baseline.  There is no clinical indication for blood transfusion at this time.  Chronic pain syndrome: Continue home medications  Code Status: Full Code Family Communication: N/A Disposition Plan: Not yet ready for discharge   Marieta Markov Rennis Petty  APRN, MSN, FNP-C [facility] [address] [place], [place] [address] [phone]  If 5PM-8AM, please contact night-coverage.  10/29/2020, 3:23 PM  LOS: 2 days

## 2020-10-27 NOTE — Plan of Care (Signed)
Pt was in severe pain all night; unable to comfortably sleep; pt tearful at times; able to ambulates to bathroom; Nurse practitioner contacted to restart pt's oxycodone 10mg  but he recommend to wait until am after review with provider. Pt ok with it; pt has call light within reach, bed at lowest position for safety and cell phone in bed with her. She has excellent support from husband which helps.

## 2020-10-28 ENCOUNTER — Other Ambulatory Visit: Payer: Self-pay | Admitting: Family Medicine

## 2020-10-28 DIAGNOSIS — G894 Chronic pain syndrome: Secondary | ICD-10-CM

## 2020-10-28 LAB — CBC
HCT: 23 % — ABNORMAL LOW (ref 36.0–46.0)
Hemoglobin: 8.7 g/dL — ABNORMAL LOW (ref 12.0–15.0)
MCH: 32.7 pg (ref 26.0–34.0)
MCHC: 37.8 g/dL — ABNORMAL HIGH (ref 30.0–36.0)
MCV: 86.5 fL (ref 80.0–100.0)
Platelets: 331 10*3/uL (ref 150–400)
RBC: 2.66 MIL/uL — ABNORMAL LOW (ref 3.87–5.11)
RDW: 14.6 % (ref 11.5–15.5)
WBC: 11.3 10*3/uL — ABNORMAL HIGH (ref 4.0–10.5)
nRBC: 0.4 % — ABNORMAL HIGH (ref 0.0–0.2)

## 2020-10-28 MED ORDER — OXYCODONE HCL ER 10 MG PO T12A: 10.0000 mg | EXTENDED_RELEASE_TABLET | Freq: Two times a day (BID) | ORAL | 0 refills | Status: DC

## 2020-10-28 MED ORDER — OXYCODONE HCL 10 MG PO TABS: 10.0000 mg | ORAL_TABLET | Freq: Four times a day (QID) | ORAL | 0 refills | Status: DC | PRN

## 2020-10-28 NOTE — Discharge Summary (Signed)
Physician Discharge Summary  Lindsey [name] GYF:[id number] DOB: May 23, 1970 DOA: 10/26/2020  PCP: Massie Maroon, FNP  Admit date: 10/26/2020  Discharge date: 10/29/2020  Discharge Diagnoses:  Principal Problem:   Sickle cell pain crisis [facility])   Discharge Condition: Stable  Disposition:   Follow-up Information     Massie Maroon, FNP Follow up in 1 week(s).   Specialty: Family Medicine Contact information: [address] [place] [address] [phone]                Pt is discharged home in good condition and is to follow up with Massie Maroon, FNP this week to have labs evaluated. Lindsey [name] is instructed to increase activity slowly and balance with rest for the next few days, and use prescribed medication to complete treatment of pain  Diet: Regular Wt Readings from Last 3 Encounters:  10/27/20 69.5 kg  08/25/20 66 kg  07/21/20 61.7 kg    History of present illness:  Lindsey [name] is a 50 year old female with a medical history significant for sickle cell disease, chronic pain syndrome, opiate dependence and tolerance, and history of anemia of chronic disease presents with a 1-2-day of right lower leg pain.  She has not had any injury or trauma to her leg.  States that pain feels similar to previous sickle cell crises.  She has not had any fever or chills.  She denies any chest pain or shortness of breath associated with her leg.  She states that she does not do any abnormal activities except for it she helped her mother blowup swimming pool for her younger cousin a few days ago.  She reports the pain is 10/10 at its worsening.  Her Pain medication at home is not relieving pain.  IV pain medication in the emergency room has helped some she states.  ER course: Lindsey [name] has been hemodynamically stable in the ER.  She has had multiple rounds of pain medication to control her pain.  No signs of infection.  CBC and CMP are unremarkable.  Hospitalist  service asked to admit patient for further management of sickle cell crisis.   Hospital Course:  Sickle cell disease with pain crisis: Patient was admitted for sickle cell pain crisis and managed appropriately with IVF, IV Dilaudid via PCA and IV Toradol, as well as other adjunct therapies per sickle cell pain management protocols.  IV Dilaudid PCA weaned appropriately and patient transition to home medications. Pain intensity is 4/10 and she is requesting discharge home.  Patient is requesting prescriptions for home opiate medications.  PDMP reviewed prior to prescribing opiate medications, no inconsistencies noted.  OxyContin 10 mg every 12 hours #60 and oxycodone 10 mg every 6 hours as needed #64 both sent to patient's pharmacy. Patient to follow-up in primary care with PCP in 2 weeks.  Patient was therefore discharged home today in a hemodynamically stable condition.   Lindsey [name] will follow-up with PCP within 2 weeks of this discharge. Lindsey [name] was counseled extensively about nonpharmacologic means of pain management, patient verbalized understanding and was appreciative of  the care received during this admission.   We discussed the need for good hydration, monitoring of hydration status, avoidance of heat, cold, stress, and infection triggers. We discussed the need to be adherent with home medications. Patient was reminded of the need to seek medical attention immediately if any symptom of bleeding, anemia, or infection occurs.  Discharge Exam: Vitals:   10/28/20 1009 10/28/20 1204  BP:  107/74   Pulse: 67   Resp: 15 14  Temp: 98.9 F (37.2 C)   SpO2: 96% 98%   Vitals:   10/28/20 0504 10/28/20 0745 10/28/20 1009 10/28/20 1204  BP: 96/63  107/74   Pulse: 73  67   Resp: 14 16 15 14   Temp: 98.6 F (37 C)  98.9 F (37.2 C)   TempSrc: Oral  Oral   SpO2: 93% 94% 96% 98%  Weight:      Height:        General appearance : Awake, alert, not in any distress. Speech Clear. Not toxic  looking HEENT: Atraumatic and Normocephalic, pupils equally reactive to light and accomodation Neck: Supple, no JVD. No cervical lymphadenopathy.  Chest: Good air entry bilaterally, no added sounds  CVS: S1 S2 regular, no murmurs.  Abdomen: Bowel sounds present, Non tender and not distended with no gaurding, rigidity or rebound. Extremities: B/L Lower Ext shows no edema, both legs are warm to touch Neurology: Awake alert, and oriented X 3, CN II-XII intact, Non focal Skin: No Rash  Discharge Instructions   Allergies as of 10/28/2020   No Known Allergies      Medication List     STOP taking these medications    DULoxetine 20 MG capsule Commonly known as: Cymbalta       TAKE these medications    ergocalciferol 1.25 MG (50000 UT) capsule Commonly known as: VITAMIN D2 Take 1 capsule (50,000 Units total) by mouth once a week.   gabapentin 300 MG capsule Commonly known as: NEURONTIN Take 1 capsule (300 mg total) by mouth 3 (three) times daily.   Melatonin 10 MG Tabs Take 10 mg by mouth at bedtime.       ASK your doctor about these medications    albuterol 108 (90 Base) MCG/ACT inhaler Commonly known as: VENTOLIN HFA INHALE 2 PUFFS INTO THE LUNGS EVERY 6 HOURS AS NEEDED FOR WHEEZING OR SHORTNESS OF BREATH   ibuprofen 800 MG tablet Commonly known as: ADVIL TAKE 1 TABLET(800 MG) BY MOUTH EVERY 8 HOURS AS NEEDED   Oxycodone HCl 10 MG Tabs Take 1 tablet (10 mg total) by mouth every 6 (six) hours as needed (pain). Ask about: Which instructions should I use?   oxyCODONE 10 mg 12 hr tablet Commonly known as: OXYCONTIN Take 1 tablet (10 mg total) by mouth every 12 (twelve) hours. Start taking on: November 01, 2020 Ask about: Which instructions should I use?        The results of significant diagnostics from this hospitalization (including imaging, microbiology, ancillary and laboratory) are listed below for reference.    Significant Diagnostic Studies: DG  Tibia/Fibula Right  Result Date: 10/26/2020 CLINICAL DATA:  Osteonecrosis versus infarct.  Sickle cell pain EXAM: RIGHT TIBIA AND FIBULA - 2 VIEW COMPARISON:  None. FINDINGS: There is no evidence of fracture or other focal bone lesions. Soft tissues are unremarkable. IMPRESSION: Negative. Electronically Signed   By: 10/28/2020 M.D.   On: 10/26/2020 01:22    Microbiology: Recent Results (from the past 240 hour(s))  Resp Panel by RT-PCR (Flu A&B, Covid) Nasopharyngeal Swab     Status: None   Collection Time: 10/26/20  5:02 AM   Specimen: Nasopharyngeal Swab; Nasopharyngeal(NP) swabs in vial transport medium  Result Value Ref Range Status   SARS Coronavirus 2 by RT PCR NEGATIVE NEGATIVE Final    Comment: (NOTE) SARS-CoV-2 target nucleic acids are NOT DETECTED.  The SARS-CoV-2 RNA is generally detectable in upper  respiratory specimens during the acute phase of infection. The lowest concentration of SARS-CoV-2 viral copies this assay can detect is 138 copies/mL. A negative result does not preclude SARS-Cov-2 infection and should not be used as the sole basis for treatment or other patient management decisions. A negative result may occur with  improper specimen collection/handling, submission of specimen other than nasopharyngeal swab, presence of viral mutation(s) within the areas targeted by this assay, and inadequate number of viral copies(<138 copies/mL). A negative result must be combined with clinical observations, patient history, and epidemiological information. The expected result is Negative.  Fact Sheet for Patients:  BloggerCourse.com  Fact Sheet for Healthcare Providers:  SeriousBroker.it  This test is no t yet approved or cleared by the [place] FDA and  has been authorized for detection and/or diagnosis of SARS-CoV-2 by FDA under an Emergency Use Authorization (EUA). This EUA will remain  in effect (meaning this  test can be used) for the duration of the COVID-19 declaration under Section 564(b)(1) of the Act, 21 U.S.C.section 360bbb-3(b)(1), unless the authorization is terminated  or revoked sooner.       Influenza A by PCR NEGATIVE NEGATIVE Final   Influenza B by PCR NEGATIVE NEGATIVE Final    Comment: (NOTE) The Xpert Xpress SARS-CoV-2/FLU/RSV plus assay is intended as an aid in the diagnosis of influenza from Nasopharyngeal swab specimens and should not be used as a sole basis for treatment. Nasal washings and aspirates are unacceptable for Xpert Xpress SARS-CoV-2/FLU/RSV testing.  Fact Sheet for Patients: BloggerCourse.com  Fact Sheet for Healthcare Providers: SeriousBroker.it  This test is not yet approved or cleared by the [place] FDA and has been authorized for detection and/or diagnosis of SARS-CoV-2 by FDA under an Emergency Use Authorization (EUA). This EUA will remain in effect (meaning this test can be used) for the duration of the COVID-19 declaration under Section 564(b)(1) of the Act, 21 U.S.C. section 360bbb-3(b)(1), unless the authorization is terminated or revoked.  Performed at [facility], [address], [place], [place] [address]      Labs: Basic Metabolic Panel: Recent Labs  Lab 10/26/20 0051 10/27/20 0606  NA 141 135  K 4.2 4.0  CL 107 107  CO2 27 25  GLUCOSE 103* 98  BUN 12 11  CREATININE 0.94 0.73  CALCIUM 9.4 8.0*   Liver Function Tests: Recent Labs  Lab 10/26/20 0051  AST 19  ALT 13  ALKPHOS 40  BILITOT 1.1  PROT 7.8  ALBUMIN 4.7   No results for input(s): LIPASE, AMYLASE in the last 168 hours. No results for input(s): AMMONIA in the last 168 hours. CBC: Recent Labs  Lab 10/26/20 0051 10/27/20 0606 10/28/20 0519  WBC 11.5* 12.4* 11.3*  HGB 10.4* 8.2* 8.7*  HCT 28.1* 22.1* 23.0*  MCV 86.5 87.4 86.5  PLT 393 263 331   Cardiac Enzymes: No results for  input(s): CKTOTAL, CKMB, CKMBINDEX, TROPONINI in the last 168 hours. BNP: Invalid input(s): POCBNP CBG: No results for input(s): GLUCAP in the last 168 hours.  Time coordinating discharge: 35 minutes  Signed:  [name]  APRN, MSN, FNP-C Patient Care [facility] [address] [place], [place] [address] [phone]  [facility] 10/29/2020, 3:59 PM

## 2020-10-28 NOTE — Progress Notes (Signed)
Reviewed PDMP substance reporting system prior to prescribing opiate medications. No inconsistencies noted.  Meds ordered this encounter  Medications   Oxycodone HCl 10 MG TABS    Sig: Take 1 tablet (10 mg total) by mouth every 6 (six) hours as needed (pain).    Dispense:  60 tablet    Refill:  0    Order Specific Question:   Supervising Provider    Answer:   [name], [name]   Janis Cuffe Moore Skya Mccullum  APRN, MSN, FNP-C Patient Care Center [facility] [address]  [place], [place] [address] [phone]  

## 2020-10-29 ENCOUNTER — Other Ambulatory Visit: Payer: Self-pay | Admitting: Family Medicine

## 2020-11-09 ENCOUNTER — Other Ambulatory Visit: Payer: Self-pay

## 2020-11-09 DIAGNOSIS — G894 Chronic pain syndrome: Secondary | ICD-10-CM

## 2020-11-10 ENCOUNTER — Other Ambulatory Visit: Payer: Self-pay | Admitting: Family Medicine

## 2020-11-10 DIAGNOSIS — G894 Chronic pain syndrome: Secondary | ICD-10-CM

## 2020-11-10 MED ORDER — OXYCODONE HCL 10 MG PO TABS: 10.0000 mg | ORAL_TABLET | Freq: Four times a day (QID) | ORAL | 0 refills | Status: DC | PRN

## 2020-11-10 NOTE — Progress Notes (Signed)
Reviewed PDMP substance reporting system prior to prescribing opiate medications. No inconsistencies noted.  Meds ordered this encounter  Medications   Oxycodone HCl 10 MG TABS    Sig: Take 1 tablet (10 mg total) by mouth every 6 (six) hours as needed (pain).    Dispense:  60 tablet    Refill:  0    Order Specific Question:   Supervising Provider    Answer:   [name], [name]   Lindsey [name] Lindsey Elmore  APRN, MSN, FNP-C Patient Care Center [facility] [address]  [place], [place] [address] [phone]  

## 2020-11-11 ENCOUNTER — Telehealth: Payer: Self-pay

## 2020-11-11 NOTE — Telephone Encounter (Signed)
Oxycodone  °

## 2020-11-23 ENCOUNTER — Telehealth: Payer: Self-pay | Admitting: Family Medicine

## 2020-11-23 ENCOUNTER — Other Ambulatory Visit: Payer: Self-pay | Admitting: Family Medicine

## 2020-11-23 DIAGNOSIS — G894 Chronic pain syndrome: Secondary | ICD-10-CM

## 2020-11-23 MED ORDER — OXYCODONE HCL 10 MG PO TABS: 10.0000 mg | ORAL_TABLET | Freq: Four times a day (QID) | ORAL | 0 refills | Status: DC | PRN

## 2020-11-23 NOTE — Progress Notes (Signed)
Reviewed PDMP substance reporting system prior to prescribing opiate medications. No inconsistencies noted.  Meds ordered this encounter  Medications   Oxycodone HCl 10 MG TABS    Sig: Take 1 tablet (10 mg total) by mouth every 6 (six) hours as needed (pain).    Dispense:  60 tablet    Refill:  0    Order Specific Question:   Supervising Provider    Answer:   [name], [name]   Haralambos Yeatts Moore Oddie Kuhlmann  APRN, MSN, FNP-C Patient Care Center [facility] [address]  [place], [place] [address] [phone]  

## 2020-11-23 NOTE — Telephone Encounter (Signed)
Pt calling for a request of the EXTENDED RELEASE for :   Oxycodone HCl 10 MG TABS [[id number]]   Pharmacy  [id number] - [place], [place] - [address] AT [facility] OF [address] & [address] [place] [address]  Phone:  [phone]  Fax:  [phone]   Thank you

## 2020-11-24 ENCOUNTER — Other Ambulatory Visit: Payer: Self-pay

## 2020-11-24 DIAGNOSIS — D571 Sickle-cell disease without crisis: Secondary | ICD-10-CM

## 2020-11-24 MED ORDER — IBUPROFEN 800 MG PO TABS: 800.0000 mg | ORAL_TABLET | Freq: Three times a day (TID) | ORAL | 5 refills | Status: DC | PRN

## 2020-11-30 ENCOUNTER — Other Ambulatory Visit: Payer: Self-pay | Admitting: Family Medicine

## 2020-11-30 ENCOUNTER — Telehealth: Payer: Self-pay

## 2020-11-30 DIAGNOSIS — G894 Chronic pain syndrome: Secondary | ICD-10-CM

## 2020-11-30 DIAGNOSIS — D571 Sickle-cell disease without crisis: Secondary | ICD-10-CM

## 2020-11-30 MED ORDER — OXYCODONE HCL ER 10 MG PO T12A: 10.0000 mg | EXTENDED_RELEASE_TABLET | Freq: Two times a day (BID) | ORAL | 0 refills | Status: DC

## 2020-11-30 MED ORDER — IBUPROFEN 800 MG PO TABS: 800.0000 mg | ORAL_TABLET | Freq: Three times a day (TID) | ORAL | 5 refills | Status: DC | PRN

## 2020-11-30 NOTE — Progress Notes (Signed)
Reviewed PDMP substance reporting system prior to prescribing opiate medications. No inconsistencies noted.  Meds ordered this encounter  Medications   oxyCODONE (OXYCONTIN) 10 mg 12 hr tablet    Sig: Take 1 tablet (10 mg total) by mouth every 12 (twelve) hours.    Dispense:  60 tablet    Refill:  0    Order Specific Question:   Supervising Provider    Answer:   Quentin Angst [[id number]]   ibuprofen (ADVIL) 800 MG tablet    Sig: Take 1 tablet (800 mg total) by mouth every 8 (eight) hours as needed. TAKE 1 TABLET(800 MG) BY MOUTH EVERY 8 HOURS AS NEEDED    Dispense:  60 tablet    Refill:  5    Order Specific Question:   Supervising Provider    Answer:   Quentin Angst [[id number]]     [name]  APRN, MSN, FNP-C Patient Care [facility] [address] [place], [place] [address] [phone]

## 2020-11-30 NOTE — Telephone Encounter (Signed)
Oxycodone  Ibuprofen  

## 2020-12-01 ENCOUNTER — Other Ambulatory Visit: Payer: Self-pay

## 2020-12-01 ENCOUNTER — Telehealth (INDEPENDENT_AMBULATORY_CARE_PROVIDER_SITE_OTHER): Payer: Medicaid Other | Admitting: Family Medicine

## 2020-12-01 DIAGNOSIS — G894 Chronic pain syndrome: Secondary | ICD-10-CM

## 2020-12-01 DIAGNOSIS — D5700 Hb-SS disease with crisis, unspecified: Secondary | ICD-10-CM

## 2020-12-01 MED ORDER — GABAPENTIN 400 MG PO CAPS: 400.0000 mg | ORAL_CAPSULE | Freq: Three times a day (TID) | ORAL | 3 refills | Status: DC

## 2020-12-01 NOTE — Progress Notes (Signed)
Patient Care Center [facility]    [name],you are scheduled for a virtual visit with your provider today.    Just as we do with appointments in the office, we must obtain your consent to participate.  Your consent will be active for this visit and any virtual visit you may have with one of our providers in the next 365 days.    If you have a MyChart account, I can also send a copy of this consent to you electronically.  All virtual visits are billed to your insurance company just like a traditional visit in the office.  As this is a virtual visit, video technology does not allow for your provider to perform a traditional examination.  This may limit your provider's ability to fully assess your condition.  If your provider identifies any concerns that need to be evaluated in person or the need to arrange testing such as labs, EKG, etc, we will make arrangements to do so.    Although advances in technology are sophisticated, we cannot ensure that it will always work on either your end or our end.  If the connection with a video visit is poor, we may have to switch to a telephone visit.  With either a video or telephone visit, we are not always able to ensure that we have a secure connection.   I need to obtain your verbal consent now.   Are you willing to proceed with your visit today?   Lindsey [name] has provided verbal consent on 12/01/2020 for a virtual visit (video or telephone). Virtual Visit via Video Note  I connected with Lindsey [name] on 12/01/20 at  2:00 PM EDT by a video enabled telemedicine application and verified that I am speaking with the correct person using two identifiers.  Location: Patient: Home Provider: [place] Patient [facility]   I discussed the limitations of evaluation and management by telemedicine and the availability of in person appointments. The patient expressed understanding and agreed to proceed.  History of  Present Illness: Lindsey [name] is a 50 year old female with a medical history significant for sickle cell disease, chronic pain syndrome, opiate dependence and tolerance, and history of anemia of chronic disease presented via video for medication management.  Patient states that pain has been increased primarily to lower extremities despite taking prescribed medications consistently.  She has not identified any provoking factors for chronic pain exacerbation.  Patient's chronic pain is typically in the low back and lower extremities.  Patient says that pain is currently 6/10.  She last had oxycodone With some relief.  Patient has also been taking OxyContin 10 mg every 12 hours.  Gabapentin.,  With previously added medication regimen for greater pain control.  She denies any headache, chest pain, urinary symptoms, nausea, vomiting, or diarrhea.  Patient does endorse ringing to her left ear that has been present over the past 3 months.  He has not had any ear infections or hearing loss.  Patient does not wear headphones or earplugs.  She has not had any ear trauma.  Also, patient denies any upper respiratory symptoms.  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   Opioid dependence in remission (HCC)    Sickle cell crisis [facility])     Social History   Socioeconomic History   Marital status: Married    Spouse name: Not on file   Number of children: 2  Years of education: Not on file   Highest education level: Not on file  Occupational History   Not on file  Tobacco Use   Smoking status: Every Day    Packs/day: 0.00    Types: Cigars, Cigarettes   Smokeless tobacco: Never  Vaping Use   Vaping Use: Former  Substance and Sexual Activity   Alcohol use: Yes    Comment: occasionally   Drug use: No   Sexual activity: Yes    Birth control/protection: None  Other Topics Concern   Not on file  Social History Narrative   Lives at home with husband and son    Right handed   Social Determinants of Health   Financial Resource Strain: Not on file  Food Insecurity: Not on file  Transportation Needs: Not on file  Physical Activity: Not on file  Stress: Not on file  Social Connections: Not on file  Intimate Partner Violence: Not on file   Immunization History  Administered Date(s) Administered   Influenza,inj,Quad PF,6+ Mos 03/30/2018, 01/29/2019   Pneumococcal Polysaccharide-23 10/05/2017    Assessment and Plan:  1. Sickle cell anemia with pain [facility]) Patient has had an increase in her chronic pain over the past several weeks and has not been controlled by home medication regimen.  No changes will be made to opiates at this point.  Will increase gabapentin to 400 mg 3 times daily.  We will reassess in 1 month. Also, patient advised to avoid stressors that trigger sickle cell crisis. Increase water intake to 64 ounces per day Folic acid 1 mg daily  - gabapentin (NEURONTIN) 400 MG capsule; Take 1 capsule (400 mg total) by mouth 3 (three) times daily.  Dispense: 90 capsule; Refill: 3  2. Chronic pain syndrome  - gabapentin (NEURONTIN) 400 MG capsule; Take 1 capsule (400 mg total) by mouth 3 (three) times daily.  Dispense: 90 capsule; Refill: 3  Follow Up Instructions:    I discussed the assessment and treatment plan with the patient. The patient was provided an opportunity to ask questions and all were answered. The patient agreed with the plan and demonstrated an understanding of the instructions.   The patient was advised to call back or seek an in-person evaluation if the symptoms worsen or if the condition fails to improve as anticipated.  I provided 7 minutes of non-face-to-face time during this encounter.   [name]  APRN, MSN, FNP-C Patient Care [facility] [address] [place], [place] [address] [phone]   12/01/2020  3:15 PM

## 2020-12-04 ENCOUNTER — Telehealth: Payer: Self-pay

## 2020-12-04 NOTE — Telephone Encounter (Signed)
Unable to initiate prior authorization for Ibuprofen 800 mg due to patient having family planning medicaid. She will have to pay out of pocket. Left voicemail advising patient.

## 2020-12-08 ENCOUNTER — Telehealth: Payer: Self-pay

## 2020-12-08 ENCOUNTER — Other Ambulatory Visit: Payer: Self-pay | Admitting: Family Medicine

## 2020-12-08 DIAGNOSIS — G894 Chronic pain syndrome: Secondary | ICD-10-CM

## 2020-12-08 MED ORDER — OXYCODONE HCL 10 MG PO TABS: 10.0000 mg | ORAL_TABLET | Freq: Four times a day (QID) | ORAL | 0 refills | Status: DC | PRN

## 2020-12-08 NOTE — Telephone Encounter (Signed)
Oxycodone  °

## 2020-12-08 NOTE — Progress Notes (Signed)
Reviewed PDMP substance reporting system prior to prescribing opiate medications. No inconsistencies noted.  Meds ordered this encounter  Medications   Oxycodone HCl 10 MG TABS    Sig: Take 1 tablet (10 mg total) by mouth every 6 (six) hours as needed (pain).    Dispense:  60 tablet    Refill:  0    Order Specific Question:   Supervising Provider    Answer:   [name], [name]   Lindsey [name] Lindsey Esperanza  APRN, MSN, FNP-C Patient Care Center [facility] [address]  [place], [place] [address] [phone]  

## 2020-12-22 ENCOUNTER — Other Ambulatory Visit: Payer: Self-pay

## 2020-12-22 ENCOUNTER — Encounter (HOSPITAL_COMMUNITY): Payer: Self-pay

## 2020-12-22 ENCOUNTER — Observation Stay (HOSPITAL_COMMUNITY)
Admission: EM | Admit: 2020-12-22 | Discharge: 2020-12-23 | Disposition: A | Discharge status: Home / Self Care | Payer: Medicaid Other | Attending: Internal Medicine | Admitting: Internal Medicine

## 2020-12-22 DIAGNOSIS — F17210 Nicotine dependence, cigarettes, uncomplicated: Secondary | ICD-10-CM | POA: Diagnosis not present

## 2020-12-22 DIAGNOSIS — G894 Chronic pain syndrome: Secondary | ICD-10-CM

## 2020-12-22 DIAGNOSIS — D649 Anemia, unspecified: Secondary | ICD-10-CM | POA: Diagnosis not present

## 2020-12-22 DIAGNOSIS — Z79899 Other long term (current) drug therapy: Secondary | ICD-10-CM | POA: Diagnosis not present

## 2020-12-22 DIAGNOSIS — F330 Major depressive disorder, recurrent, mild: Secondary | ICD-10-CM | POA: Insufficient documentation

## 2020-12-22 DIAGNOSIS — Z20822 Contact with and (suspected) exposure to covid-19: Secondary | ICD-10-CM | POA: Diagnosis not present

## 2020-12-22 DIAGNOSIS — D571 Sickle-cell disease without crisis: Secondary | ICD-10-CM | POA: Diagnosis present

## 2020-12-22 DIAGNOSIS — D5700 Hb-SS disease with crisis, unspecified: Secondary | ICD-10-CM | POA: Diagnosis not present

## 2020-12-22 DIAGNOSIS — D638 Anemia in other chronic diseases classified elsewhere: Secondary | ICD-10-CM | POA: Diagnosis present

## 2020-12-22 DIAGNOSIS — F320 Major depressive disorder, single episode, mild: Secondary | ICD-10-CM | POA: Diagnosis present

## 2020-12-22 DIAGNOSIS — G8929 Other chronic pain: Secondary | ICD-10-CM | POA: Diagnosis not present

## 2020-12-22 LAB — COMPREHENSIVE METABOLIC PANEL
ALT: 12 U/L (ref 0–44)
AST: 16 U/L (ref 15–41)
Albumin: 4.2 g/dL (ref 3.5–5.0)
Alkaline Phosphatase: 32 U/L — ABNORMAL LOW (ref 38–126)
Anion gap: 6 (ref 5–15)
BUN: 11 mg/dL (ref 6–20)
CO2: 26 mmol/L (ref 22–32)
Calcium: 9.1 mg/dL (ref 8.9–10.3)
Chloride: 108 mmol/L (ref 98–111)
Creatinine, Ser: 0.89 mg/dL (ref 0.44–1.00)
GFR, Estimated: >60 mL/min (ref >60)
Glucose, Bld: 79 mg/dL (ref 70–99)
Potassium: 4.4 mmol/L (ref 3.5–5.1)
Sodium: 140 mmol/L (ref 135–145)
Total Bilirubin: 1.2 mg/dL (ref 0.3–1.2)
Total Protein: 7.1 g/dL (ref 6.5–8.1)

## 2020-12-22 LAB — CBC WITH DIFFERENTIAL/PLATELET
Abs Immature Granulocytes: 0.01 10*3/uL (ref 0.00–0.07)
Basophils Absolute: 0.1 10*3/uL (ref 0.0–0.1)
Basophils Relative: 1 %
Eosinophils Absolute: 0.7 10*3/uL — ABNORMAL HIGH (ref 0.0–0.5)
Eosinophils Relative: 7 %
HCT: 27.8 % — ABNORMAL LOW (ref 36.0–46.0)
Hemoglobin: 10.5 g/dL — ABNORMAL LOW (ref 12.0–15.0)
Immature Granulocytes: 0 %
Lymphocytes Relative: 43 %
Lymphs Abs: 3.8 10*3/uL (ref 0.7–4.0)
MCH: 32.5 pg (ref 26.0–34.0)
MCHC: 37.8 g/dL — ABNORMAL HIGH (ref 30.0–36.0)
MCV: 86.1 fL (ref 80.0–100.0)
Monocytes Absolute: 0.7 10*3/uL (ref 0.1–1.0)
Monocytes Relative: 8 %
Neutro Abs: 3.6 10*3/uL (ref 1.7–7.7)
Neutrophils Relative %: 41 %
Platelets: 426 10*3/uL — ABNORMAL HIGH (ref 150–400)
RBC: 3.23 MIL/uL — ABNORMAL LOW (ref 3.87–5.11)
RDW: 13.8 % (ref 11.5–15.5)
WBC: 8.9 10*3/uL (ref 4.0–10.5)
nRBC: 0.5 % — ABNORMAL HIGH (ref 0.0–0.2)

## 2020-12-22 LAB — I-STAT BETA HCG BLOOD, ED (MC, WL, AP ONLY): I-stat hCG, quantitative: <5 m[IU]/mL (ref <5)

## 2020-12-22 LAB — RETICULOCYTES
Immature Retic Fract: 15 % (ref 2.3–15.9)
RBC.: 3.21 MIL/uL — ABNORMAL LOW (ref 3.87–5.11)
Retic Count, Absolute: 136.1 10*3/uL (ref 19.0–186.0)
Retic Ct Pct: 4.2 % — ABNORMAL HIGH (ref 0.4–3.1)

## 2020-12-22 MED ORDER — KETOROLAC TROMETHAMINE 30 MG/ML IJ SOLN
30.0000 mg | Freq: Four times a day (QID) | INTRAMUSCULAR | Status: DC
  Administered 2020-12-22 – 2020-12-23 (×4): 30 mg via INTRAVENOUS
  Filled 2020-12-22 (×4): qty 1

## 2020-12-22 MED ORDER — VITAMIN D (ERGOCALCIFEROL) 1.25 MG (50000 UNIT) PO CAPS
50000.0000 [IU] | ORAL_CAPSULE | ORAL | Status: DC
  Administered 2020-12-23: 50000 [IU] via ORAL
  Filled 2020-12-22: qty 1

## 2020-12-22 MED ORDER — ENOXAPARIN SODIUM 40 MG/0.4ML IJ SOSY
40.0000 mg | PREFILLED_SYRINGE | INTRAMUSCULAR | Status: DC
  Administered 2020-12-22: 40 mg via SUBCUTANEOUS
  Filled 2020-12-22: qty 0.4

## 2020-12-22 MED ORDER — ALBUTEROL SULFATE (2.5 MG/3ML) 0.083% IN NEBU: 2.5000 mg | INHALATION_SOLUTION | Freq: Four times a day (QID) | RESPIRATORY_TRACT | Status: DC | PRN

## 2020-12-22 MED ORDER — HYDROMORPHONE HCL 2 MG/ML IJ SOLN
2.0000 mg | INTRAMUSCULAR | Status: AC
  Administered 2020-12-22: 2 mg via INTRAVENOUS
  Filled 2020-12-22: qty 1

## 2020-12-22 MED ORDER — GABAPENTIN 400 MG PO CAPS
400.0000 mg | ORAL_CAPSULE | Freq: Three times a day (TID) | ORAL | Status: DC
  Administered 2020-12-22 – 2020-12-23 (×2): 400 mg via ORAL
  Filled 2020-12-22 (×2): qty 1

## 2020-12-22 MED ORDER — SENNOSIDES-DOCUSATE SODIUM 8.6-50 MG PO TABS
1.0000 | ORAL_TABLET | Freq: Two times a day (BID) | ORAL | Status: DC
  Administered 2020-12-22 – 2020-12-23 (×2): 1 via ORAL
  Filled 2020-12-22 (×2): qty 1

## 2020-12-22 MED ORDER — SODIUM CHLORIDE 0.9% FLUSH: 9.0000 mL | INTRAVENOUS | Status: DC | PRN

## 2020-12-22 MED ORDER — DEXTROSE-NACL 5-0.45 % IV SOLN: INTRAVENOUS | Status: DC

## 2020-12-22 MED ORDER — NALOXONE HCL 0.4 MG/ML IJ SOLN: 0.4000 mg | INTRAMUSCULAR | Status: DC | PRN

## 2020-12-22 MED ORDER — DIPHENHYDRAMINE HCL 50 MG/ML IJ SOLN
25.0000 mg | Freq: Once | INTRAMUSCULAR | Status: DC
  Filled 2020-12-22: qty 1

## 2020-12-22 MED ORDER — ALBUTEROL SULFATE HFA 108 (90 BASE) MCG/ACT IN AERS: 2.0000 | INHALATION_SPRAY | Freq: Four times a day (QID) | RESPIRATORY_TRACT | Status: DC | PRN

## 2020-12-22 MED ORDER — HYDROMORPHONE HCL 1 MG/ML IJ SOLN
1.0000 mg | INTRAMUSCULAR | Status: DC | PRN
  Administered 2020-12-22 – 2020-12-23 (×3): 1 mg via INTRAVENOUS
  Filled 2020-12-22 (×3): qty 1

## 2020-12-22 MED ORDER — DIPHENHYDRAMINE HCL 25 MG PO CAPS: 25.0000 mg | ORAL_CAPSULE | ORAL | Status: DC | PRN

## 2020-12-22 MED ORDER — ONDANSETRON HCL 4 MG/2ML IJ SOLN
4.0000 mg | INTRAMUSCULAR | Status: DC | PRN
  Administered 2020-12-22: 4 mg via INTRAVENOUS
  Filled 2020-12-22 (×2): qty 2

## 2020-12-22 MED ORDER — SODIUM CHLORIDE 0.9 % IV SOLN
25.0000 mg | INTRAVENOUS | Status: DC | PRN
  Filled 2020-12-22: qty 0.5

## 2020-12-22 MED ORDER — OXYCODONE HCL ER 10 MG PO T12A
10.0000 mg | EXTENDED_RELEASE_TABLET | Freq: Two times a day (BID) | ORAL | Status: DC
  Administered 2020-12-22 – 2020-12-23 (×2): 10 mg via ORAL
  Filled 2020-12-22 (×2): qty 1

## 2020-12-22 MED ORDER — HYDROMORPHONE 1 MG/ML IV SOLN
INTRAVENOUS | Status: DC
Start: 2020-12-22 — End: 2020-12-23
  Administered 2020-12-23: 5 mg via INTRAVENOUS
  Filled 2020-12-22: qty 30

## 2020-12-22 MED ORDER — ONDANSETRON HCL 4 MG/2ML IJ SOLN
4.0000 mg | Freq: Four times a day (QID) | INTRAMUSCULAR | Status: DC | PRN
  Administered 2020-12-22: 4 mg via INTRAVENOUS

## 2020-12-22 MED ORDER — POLYETHYLENE GLYCOL 3350 17 G PO PACK: 17.0000 g | PACK | Freq: Every day | ORAL | Status: DC | PRN

## 2020-12-22 NOTE — ED Provider Notes (Signed)
[facility]-EMERGENCY DEPT Provider Note   CSN: [id number] Arrival date & time: 12/22/20  1351     History Chief Complaint  Patient presents with   Sickle Cell Pain Crisis    Lindsey [name] is a 50 y.o. female.  50 year old female with history of sickle disease as well as chronic pain presents with pain to her her left shoulder consistent with her prior sickle cell crisis.  Denies any cough congestion.  No fever or chills.  Never short of breath.  No distal numbness or tingling to her left hand.  Pain unresponsive to her home medications.      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   Opioid dependence in remission (HCC)    Sickle cell crisis [facility])     Patient Active Problem List   Diagnosis Date Noted   Cough 06/27/2020   Hypokalemia 06/27/2020   Acute pain of left shoulder    Right leg pain    Sickle cell anemia with crisis (HCC) 09/10/2019   Anemia of chronic disease 09/10/2019   Current mild episode of major depressive disorder (HCC)    Sickle cell crisis (HCC) 02/28/2019   Opioid dependence in remission (HCC) 10/01/2017   Sickle cell disease (HCC) 10/01/2017   Chronic pain 10/01/2017   Opiate abuse, 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     Family History  Problem Relation Age of Onset   Stroke Neg Hx        none that she knows of    Seizures Neg Hx     Social History   Tobacco Use   Smoking status: Every Day    Packs/day: 0.00    Types: Cigars, Cigarettes   Smokeless tobacco: Never  Vaping Use   Vaping Use: Former  Substance Use Topics   Alcohol use: Yes    Comment: occasionally   Drug use:  No    Home Medications Prior to Admission medications   Medication Sig Start Date End Date Taking? Authorizing Provider  gabapentin (NEURONTIN) 400 MG capsule Take 1 capsule (400 mg total) by mouth 3 (three) times daily. 12/01/20  Yes Massie Maroon, FNP  ibuprofen (ADVIL) 800 MG tablet Take 1 tablet (800 mg total) by mouth every 8 (eight) hours as needed. TAKE 1 TABLET(800 MG) BY MOUTH EVERY 8 HOURS AS NEEDED 11/30/20  Yes Massie Maroon, FNP  oxyCODONE (OXYCONTIN) 10 mg 12 hr tablet Take 1 tablet (10 mg total) by mouth every 12 (twelve) hours. 12/02/20 01/01/21 Yes Massie Maroon, FNP  albuterol (VENTOLIN HFA) 108 (90 Base) MCG/ACT inhaler INHALE 2 PUFFS INTO THE LUNGS EVERY 6 HOURS AS NEEDED FOR WHEEZING OR SHORTNESS OF BREATH Patient not taking: Reported on 12/01/2020 06/01/20   Massie Maroon, FNP  ergocalciferol (VITAMIN D2) 1.25 MG (50000 UT) capsule Take 1 capsule (50,000 Units total) by mouth once a week. Patient not taking: Reported on 12/01/2020 10/16/19   Massie Maroon, FNP  Oxycodone HCl 10 MG TABS Take 1 tablet (10 mg total) by mouth every 6 (six) hours as needed (pain). 12/10/20  Massie Maroon, FNP    Allergies    Patient has no known allergies.  Review of Systems   Review of Systems  All other systems reviewed and are negative.  Physical Exam Updated Vital Signs BP 114/61   Pulse 73   Temp 99 F (37.2 C) (Oral)   Resp 18   SpO2 96%   Physical Exam Vitals and nursing note reviewed.  Constitutional:      General: She is not in acute distress.    Appearance: Normal appearance. She is well-developed. She is not toxic-appearing.  HENT:     Head: Normocephalic and atraumatic.  Eyes:     General: Lids are normal.     Conjunctiva/sclera: Conjunctivae normal.     Pupils: Pupils are equal, round, and reactive to light.  Neck:     Thyroid: No thyroid mass.     Trachea: No tracheal deviation.  Cardiovascular:     Rate and Rhythm: Normal rate and regular  rhythm.     Heart sounds: Normal heart sounds. No murmur heard.   No gallop.  Pulmonary:     Effort: Pulmonary effort is normal. No respiratory distress.     Breath sounds: Normal breath sounds. No stridor. No decreased breath sounds, wheezing, rhonchi or rales.  Abdominal:     General: There is no distension.     Palpations: Abdomen is soft.     Tenderness: There is no abdominal tenderness. There is no rebound.  Musculoskeletal:        General: Normal range of motion.     Left shoulder: Tenderness present. No deformity or bony tenderness. Normal strength. Normal pulse.     Cervical back: Normal range of motion and neck supple.  Skin:    General: Skin is warm and dry.     Findings: No abrasion or rash.  Neurological:     Mental Status: She is alert and oriented to person, place, and time. Mental status is at baseline.     GCS: GCS eye subscore is 4. GCS verbal subscore is 5. GCS motor subscore is 6.     Cranial Nerves: Cranial nerves are intact. No cranial nerve deficit.     Sensory: No sensory deficit.     Motor: Motor function is intact.  Psychiatric:        Attention and Perception: Attention normal.        Speech: Speech normal.        Behavior: Behavior normal.    ED Results / Procedures / Treatments   Labs (all labs ordered are listed, but only abnormal results are displayed) Labs Reviewed  CBC WITH DIFFERENTIAL/PLATELET  COMPREHENSIVE METABOLIC PANEL  RETICULOCYTES  I-STAT BETA HCG BLOOD, ED ([facility], [facility], [facility] ONLY)    EKG None  Radiology No results found.  Procedures Procedures   Medications Ordered in ED Medications  HYDROmorphone (DILAUDID) injection 2 mg (has no administration in time range)  HYDROmorphone (DILAUDID) injection 2 mg (has no administration in time range)  diphenhydrAMINE (BENADRYL) injection 25 mg (has no administration in time range)  ondansetron (ZOFRAN) injection 4 mg (has no administration in time range)    ED Course  I have reviewed the  triage vital signs and the nursing notes.  Pertinent labs & imaging results that were available during my care of the patient were reviewed by me and considered in my medical decision making (see chart for details).    MDM Rules/Calculators/A&P  Patient's hemoglobin is stable here.  Was given 2 doses of IV hydromorphone continues to still be in pain.  Will admit to the hospitalist for pain management Final Clinical Impression(s) / ED Diagnoses Final diagnoses:  None    Rx / DC Orders ED Discharge Orders     None        Lorre Nick, MD 12/22/20 [phone]

## 2020-12-22 NOTE — ED Triage Notes (Signed)
Pt arrived via POV, c/o SCC, pain mainly to left shoulder/arm.

## 2020-12-22 NOTE — ED Notes (Signed)
Difficulty scanning Dilaudid vial. Confirmed with second nurse Lind Covert, RN, correct route and dosage, as per requested by pharmacy.

## 2020-12-22 NOTE — H&P (Signed)
History and Physical   Lindsey [name] QQV:[id number] DOB: 11-16-1970 DOA: 12/22/2020  Referring MD/NP/PA: Dr Bruce Donath  PCP: Massie Maroon, FNP   Outpatient Specialists: None  Patient coming from: Home  Chief Complaint: Pain in back and legs  HPI: Lindsey [name] is a 50 y.o. female with medical history significant of sickle cell disease, opiate dependence, depression who presented to the ER with 3 days of progressive pain in her lower back legs.  She has tried all home medications including her long-acting OxyContin and short acting oxycodone with no relief.  Patient came to the ER where her pain was at 9 out of 10.  She has received up to 6 mg of IV Dilaudid serially but no relief.  Patient is therefore being admitted to the hospital with acute sickle cell crisis.  Her vitals and H&H appears to be close to baseline.  She has tachycardia probably from pain crisis..  ED Course: Temperature 99 blood pressure 140s over 97, pulse 103 respiratory rate of 20 oxygen sat 96% room air.  Her white count is 8.9 hemoglobin 10.5 and platelets 426.  Chemistry appears to be within normal.  Serum pregnancy test is negative.  COVID-19 and viral screen currently pending.  Patient being admitted to the hospital with acute sickle cell crisis.  Review of Systems: As per HPI otherwise 10 point review of systems negative.    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 She has never used smokeless tobacco. She reports current alcohol use. She reports that she does not use drugs.  No Known Allergies  Family History  Problem Relation Age of Onset   Stroke Neg Hx        none that she knows of    Seizures Neg  Hx      Prior to Admission medications   Medication Sig Start Date End Date Taking? Authorizing Provider  gabapentin (NEURONTIN) 400 MG capsule Take 1 capsule (400 mg total) by mouth 3 (three) times daily. 12/01/20  Yes Massie Maroon, FNP  ibuprofen (ADVIL) 800 MG tablet Take 1 tablet (800 mg total) by mouth every 8 (eight) hours as needed. TAKE 1 TABLET(800 MG) BY MOUTH EVERY 8 HOURS AS NEEDED 11/30/20  Yes Massie Maroon, FNP  oxyCODONE (OXYCONTIN) 10 mg 12 hr tablet Take 1 tablet (10 mg total) by mouth every 12 (twelve) hours. 12/02/20 01/01/21 Yes Massie Maroon, FNP  albuterol (VENTOLIN HFA) 108 (90 Base) MCG/ACT inhaler INHALE 2 PUFFS INTO THE LUNGS EVERY 6 HOURS AS NEEDED FOR WHEEZING OR SHORTNESS OF BREATH Patient not taking: Reported on 12/01/2020 06/01/20   Massie Maroon, FNP  ergocalciferol (VITAMIN D2) 1.25 MG (50000 UT) capsule Take 1 capsule (50,000 Units total) by mouth once a week. Patient not taking: Reported on 12/01/2020 10/16/19   Massie Maroon, FNP  Oxycodone HCl 10 MG TABS Take 1 tablet (10 mg total) by mouth every 6 (six) hours as needed (pain). 12/10/20   Massie Maroon, FNP    Physical Exam: Vitals:   12/22/20 1730 12/22/20 1737 12/22/20 1800 12/22/20 1900  BP: 115/76  117/71 134/83  Pulse: 62 64 65 60  Resp: 12 13 13 11   Temp:      TempSrc:      SpO2: 97% 96% 98% 100%      Constitutional: Acutely ill looking in mild distress Vitals:   12/22/20 1730 12/22/20 1737 12/22/20 1800 12/22/20 1900  BP: 115/76  117/71 134/83  Pulse: 62 64 65 60  Resp: 12 13 13 11   Temp:      TempSrc:      SpO2: 97% 96% 98% 100%   Eyes: PERRL, lids and conjunctivae normal ENMT: Mucous membranes are dry posterior pharynx clear of any exudate or lesions.Normal dentition.  Neck: normal, supple, no masses, no thyromegaly Respiratory: clear to auscultation bilaterally, no wheezing, no crackles. Normal respiratory effort. No accessory muscle use.  Cardiovascular: Regular  rate and rhythm, no murmurs / rubs / gallops. No extremity edema. 2+ pedal pulses. No carotid bruits.  Abdomen: no tenderness, no masses palpated. No hepatosplenomegaly. Bowel sounds positive.  Musculoskeletal: no clubbing / cyanosis. No joint deformity upper and lower extremities. Good ROM, no contractures. Normal muscle tone.  Skin: no rashes, lesions, ulcers. No induration Neurologic: CN 2-12 grossly intact. Sensation intact, DTR normal. Strength 5/5 in all 4.  Psychiatric: Normal judgment and insight. Alert and oriented x 3. Normal mood.     Labs on Admission: I have personally reviewed following labs and imaging studies  CBC: Recent Labs  Lab 12/22/20 1547  WBC 8.9  NEUTROABS 3.6  HGB 10.5*  HCT 27.8*  MCV 86.1  PLT 426*   Basic Metabolic Panel: Recent Labs  Lab 12/22/20 1547  NA 140  K 4.4  CL 108  CO2 26  GLUCOSE 79  BUN 11  CREATININE 0.89  CALCIUM 9.1   GFR: CrCl cannot be calculated (Unknown ideal weight.). Liver Function Tests: Recent Labs  Lab 12/22/20 1547  AST 16  ALT 12  ALKPHOS 32*  BILITOT 1.2  PROT 7.1  ALBUMIN 4.2   No results for input(s): LIPASE, AMYLASE in the last 168 hours. No results for input(s): AMMONIA in the last 168 hours. Coagulation Profile: No results for input(s): INR, PROTIME in the last 168 hours. Cardiac Enzymes: No results for input(s): CKTOTAL, CKMB, CKMBINDEX, TROPONINI in the last 168 hours. BNP (last 3 results) No results for input(s): PROBNP in the last 8760 hours. HbA1C: No results for input(s): HGBA1C in the last 72 hours. CBG: No results for input(s): GLUCAP in the last 168 hours. Lipid Profile: No results for input(s): CHOL, HDL, LDLCALC, TRIG, CHOLHDL, LDLDIRECT in the last 72 hours. Thyroid Function Tests: No results for input(s): TSH, T4TOTAL, FREET4, T3FREE, THYROIDAB in the last 72 hours. Anemia Panel: Recent Labs    12/22/20 1547  RETICCTPCT 4.2*   Urine analysis:    Component Value Date/Time    COLORURINE YELLOW 10/24/2019 0230   APPEARANCEUR CLEAR 10/24/2019 0230   LABSPEC 1.016 10/24/2019 0230   PHURINE 5.0 10/24/2019 0230   GLUCOSEU NEGATIVE 10/24/2019 0230   HGBUR NEGATIVE 10/24/2019 0230   BILIRUBINUR neg 04/07/2020 1014   KETONESUR NEGATIVE 10/24/2019 0230   PROTEINUR Negative 04/07/2020 1014   PROTEINUR NEGATIVE 10/24/2019 0230   UROBILINOGEN 0.2 04/07/2020 1014   UROBILINOGEN 1.0 12/25/2012 1755   NITRITE neg 04/07/2020 1014   NITRITE NEGATIVE 10/24/2019 0230   LEUKOCYTESUR Small (1+) (A) 04/07/2020 1014   LEUKOCYTESUR TRACE (A) 10/24/2019 0230   Sepsis Labs: @LABRCNTIP (procalcitonin:4,lacticidven:4) )No results found for this or any previous visit (from the past 240 hour(s)).  Radiological Exams on Admission: No results found.    Assessment/Plan Principal Problem:   Sickle cell pain crisis (HCC) Active Problems:   Chronic pain   Current mild episode of major depressive disorder (HCC)   Sickle cell anemia with crisis (HCC)   Anemia of chronic disease     #1 acute sickle cell pain crisis: Patient appears to be in painful crisis.  H&H appears stable.  Admit the patient.  Initiate IV Dilaudid PCA with Toradol and IV fluids.  D5 half-normal at 125 cc an hour.  Continue MS Contin.  #2 chronic pain syndrome: Initiate on resume MS Contin from home.  Continue pain assessment.  #3 history of depression: Confirm on continue home regimen.  #4 anemia of chronic disease: H&H appears close to baseline.  Continue monitoring    DVT prophylaxis: Lovenox Code Status: Full code Family Communication: No family at bedside Disposition Plan: Home Consults called: None Admission status: Inpatient  Severity of Illness: The appropriate patient status for this patient is INPATIENT. Inpatient status is judged to be reasonable and necessary in order to provide the required intensity of service to ensure the patient's safety. The patient's presenting symptoms, physical  exam findings, and initial radiographic and laboratory data in the context of their chronic comorbidities is felt to place them at high risk for further clinical deterioration. Furthermore, it is not anticipated that the patient will be medically stable for discharge from the hospital within 2 midnights of admission. The following factors support the patient status of inpatient.   " The patient's presenting symptoms include pain in lower back and legs. " The worrisome physical exam findings include dry mucous membranes. " The initial radiographic and laboratory data are worrisome because of normal hemoglobin. " The chronic co-morbidities include sickle cell disease.   * I certify that at the point of admission it is my clinical judgment that the patient will require inpatient hospital care spanning beyond 2 midnights from the point of admission due to high intensity of service, high risk for further deterioration and high frequency of surveillance required.Lonia Blood MD [facility] Pager [phone]  If 7PM-7AM, please contact night-coverage [email or web address] Password [facility]  12/22/2020, 7:06 PM

## 2020-12-22 NOTE — ED Provider Notes (Signed)
Emergency Medicine Provider Triage Evaluation Note  Lindsey [name] , a 50 y.o. female  was evaluated in triage.  Pt complains of left shoulder pain over the past couple of days that is consistent with her sickle cell pain crisis.  Uncontrolled with pain management at home.  Denies injury.  Denies chest pain, shortness breath, or cough.  Review of Systems  Positive: Shoulder pain Negative: Fever, chest pain, cough, numbness, weakness  Physical Exam  BP (!) 146/97 (BP Location: Right Arm)   Pulse (!) 103   Temp 99 F (37.2 C) (Oral)   Resp 20   SpO2 100%  Gen:   Awake, mildly tearful Resp:  Normal effort  MSK:   Moves extremities without difficulty  Other:  Patient able to actively range the left shoulder.  Mildly tender.  Neurovascular intact distally.  Medical Decision Making  Medically screening exam initiated at 2:18 PM.  Appropriate orders placed.  Lindsey [name] was informed that the remainder of the evaluation will be completed by another provider, this initial triage assessment does not replace that evaluation, and the importance of remaining in the ED until their evaluation is complete.  Sickle cell anemia with pain.   Desmond Lope 12/22/20 1426    Derwood Kaplan, MD 12/22/20 1723

## 2020-12-23 ENCOUNTER — Other Ambulatory Visit: Payer: Self-pay | Admitting: Family Medicine

## 2020-12-23 ENCOUNTER — Telehealth: Payer: Self-pay

## 2020-12-23 DIAGNOSIS — G894 Chronic pain syndrome: Secondary | ICD-10-CM

## 2020-12-23 DIAGNOSIS — D5700 Hb-SS disease with crisis, unspecified: Secondary | ICD-10-CM | POA: Diagnosis not present

## 2020-12-23 LAB — COMPREHENSIVE METABOLIC PANEL
ALT: 12 U/L (ref 0–44)
AST: 19 U/L (ref 15–41)
Albumin: 4 g/dL (ref 3.5–5.0)
Alkaline Phosphatase: 29 U/L — ABNORMAL LOW (ref 38–126)
Anion gap: 6 (ref 5–15)
BUN: 11 mg/dL (ref 6–20)
CO2: 26 mmol/L (ref 22–32)
Calcium: 8.6 mg/dL — ABNORMAL LOW (ref 8.9–10.3)
Chloride: 108 mmol/L (ref 98–111)
Creatinine, Ser: 0.85 mg/dL (ref 0.44–1.00)
GFR, Estimated: >60 mL/min (ref >60)
Glucose, Bld: 105 mg/dL — ABNORMAL HIGH (ref 70–99)
Potassium: 4.2 mmol/L (ref 3.5–5.1)
Sodium: 140 mmol/L (ref 135–145)
Total Bilirubin: 0.9 mg/dL (ref 0.3–1.2)
Total Protein: 6.8 g/dL (ref 6.5–8.1)

## 2020-12-23 LAB — CBC WITH DIFFERENTIAL/PLATELET
Abs Immature Granulocytes: 0.04 10*3/uL (ref 0.00–0.07)
Basophils Absolute: 0.1 10*3/uL (ref 0.0–0.1)
Basophils Relative: 1 %
Eosinophils Absolute: 0.8 10*3/uL — ABNORMAL HIGH (ref 0.0–0.5)
Eosinophils Relative: 8 %
HCT: 25.5 % — ABNORMAL LOW (ref 36.0–46.0)
Hemoglobin: 9.2 g/dL — ABNORMAL LOW (ref 12.0–15.0)
Immature Granulocytes: 0 %
Lymphocytes Relative: 52 %
Lymphs Abs: 5.2 10*3/uL — ABNORMAL HIGH (ref 0.7–4.0)
MCH: 31.9 pg (ref 26.0–34.0)
MCHC: 36.1 g/dL — ABNORMAL HIGH (ref 30.0–36.0)
MCV: 88.5 fL (ref 80.0–100.0)
Monocytes Absolute: 0.8 10*3/uL (ref 0.1–1.0)
Monocytes Relative: 8 %
Neutro Abs: 3.1 10*3/uL (ref 1.7–7.7)
Neutrophils Relative %: 31 %
Platelets: 360 10*3/uL (ref 150–400)
RBC: 2.88 MIL/uL — ABNORMAL LOW (ref 3.87–5.11)
RDW: 14.1 % (ref 11.5–15.5)
WBC: 9.9 10*3/uL (ref 4.0–10.5)
nRBC: 0.5 % — ABNORMAL HIGH (ref 0.0–0.2)

## 2020-12-23 LAB — SARS CORONAVIRUS 2 (TAT 6-24 HRS): SARS Coronavirus 2: NEGATIVE

## 2020-12-23 MED ORDER — OXYCODONE HCL 10 MG PO TABS: 10.0000 mg | ORAL_TABLET | Freq: Four times a day (QID) | ORAL | 0 refills | Status: DC | PRN

## 2020-12-23 NOTE — Discharge Summary (Signed)
Physician Discharge Summary  Lindsey [name] ONG:[id number] DOB: 1970/07/26 DOA: 12/22/2020  PCP: Massie Maroon, FNP  Admit date: 12/22/2020  Discharge date: 12/23/2020  Discharge Diagnoses:  Principal Problem:   Sickle cell pain crisis (HCC) Active Problems:   Chronic pain   Current mild episode of major depressive disorder (HCC)   Sickle cell anemia with crisis (HCC)   Anemia of chronic disease   Discharge Condition: Stable  Disposition:   Follow-up Information     Massie Maroon, FNP Follow up.   Specialty: Family Medicine Contact information: [address] [place] [address] [phone]                Pt is discharged home in good condition and is to follow up with Massie Maroon, FNP this week to have labs evaluated. Lindsey [name] is instructed to increase activity slowly and balance with rest for the next few days, and use prescribed medication to complete treatment of pain  Diet: Regular Wt Readings from Last 3 Encounters:  12/23/20 65.6 kg  10/27/20 69.5 kg  08/25/20 66 kg    History of present illness:  Lindsey [name] is a 50 year old female with a medical history significant for sickle cell disease, chronic pain syndrome, opiate dependence and tolerance, and depression who presented to the ER with 3 days of progressive pain in lower back and legs.  She has tried all home medications including her long-acting OxyContin and short acting oxycodone without relief.  Patient came to the ER, where her pain was 9/10.  She has received up to 6 mg of IV Dilaudid serially without relief.  Patient is therefore being admitted to the hospital with acute sickle cell crisis.  Her vitals and H&H appear to be close to her baseline.  She has tachycardia, more than likely due to pain crisis.  ER course: Temperature 99 F, BP 140/97, pulse 103, respiratory rate 20, oxygen saturation 96% on RA.  WBC count is 8.9 g/dL, WBCs 34.7, and  platelets 426,000.  Complete metabolic panel appears to be within normal range.  Serum pregnancy is negative.  COVID-19 negative.  Patient admitted to MedSurg for further management of acute sickle cell pain crisis.  Hospital Course:  Sickle cell disease with pain crisis: Patient was admitted for sickle cell pain crisis and managed appropriately with IVF, IV Dilaudid via PCA and IV Toradol, as well as other adjunct therapies per sickle cell pain management protocols.  States that she is not having any pain at this time and is requesting discharge home.  She will resume her home medications.  Patient says that she is due for refill on oxycodone.  Oxycodone 10 mg every 6 hours #60 was sent to patient's pharmacy.  PDMP substance reporting system was reviewed prior to prescribing opiate medications, no inconsistencies noted. Patient is alert, oriented, and ambulating without assistance.  Patient was therefore discharged home today in a hemodynamically stable condition.   Lindsey [name] will follow-up with PCP within 1 week of this discharge. Lindsey [name] was counseled extensively about nonpharmacologic means of pain management, patient verbalized understanding and was appreciative of  the care received during this admission.   We discussed the need for good hydration, monitoring of hydration status, avoidance of heat, cold, stress, and infection triggers. We discussed the need to be adherent with taking home medications. Patient was reminded of the need to seek medical attention immediately if any symptom of bleeding, anemia, or infection occurs.  Discharge Exam:  Vitals:   12/23/20 1305 12/23/20 1307  BP: 108/74   Pulse: (!) 58   Resp:    Temp:    SpO2: 99% 98%   Vitals:   12/23/20 0722 12/23/20 1100 12/23/20 1305 12/23/20 1307  BP: 104/70 (!) 92/46 108/74   Pulse: 60 (!) 57 (!) 58   Resp: 13 17    Temp: 97.9 F (36.6 C) 98.1 F (36.7 C)    TempSrc: Oral Oral    SpO2: 96% 98% 99% 98%  Weight:       Height:        General appearance : Awake, alert, not in any distress. Speech Clear. Not toxic looking HEENT: Atraumatic and Normocephalic, pupils equally reactive to light and accomodation Neck: Supple, no JVD. No cervical lymphadenopathy.  Chest: Good air entry bilaterally, no added sounds  CVS: S1 S2 regular, no murmurs.  Abdomen: Bowel sounds present, Non tender and not distended with no gaurding, rigidity or rebound. Extremities: B/L Lower Ext shows no edema, both legs are warm to touch Neurology: Awake alert, and oriented X 3, CN II-XII intact, Non focal Skin: No Rash  Discharge Instructions  Discharge Instructions     Discharge patient   Complete by: As directed    Discharge disposition: 01-Home or Self Care   Discharge patient date: 12/23/2020      Allergies as of 12/23/2020   No Known Allergies      Medication List     TAKE these medications    albuterol 108 (90 Base) MCG/ACT inhaler Commonly known as: VENTOLIN HFA INHALE 2 PUFFS INTO THE LUNGS EVERY 6 HOURS AS NEEDED FOR WHEEZING OR SHORTNESS OF BREATH   ergocalciferol 1.25 MG (50000 UT) capsule Commonly known as: VITAMIN D2 Take 1 capsule (50,000 Units total) by mouth once a week.   gabapentin 400 MG capsule Commonly known as: NEURONTIN Take 1 capsule (400 mg total) by mouth 3 (three) times daily.   ibuprofen 800 MG tablet Commonly known as: ADVIL Take 1 tablet (800 mg total) by mouth every 8 (eight) hours as needed. TAKE 1 TABLET(800 MG) BY MOUTH EVERY 8 HOURS AS NEEDED   oxyCODONE 10 mg 12 hr tablet Commonly known as: OXYCONTIN Take 1 tablet (10 mg total) by mouth every 12 (twelve) hours. What changed: Another medication with the same name was removed. Continue taking this medication, and follow the directions you see here.        The results of significant diagnostics from this hospitalization (including imaging, microbiology, ancillary and laboratory) are listed below for reference.     Significant Diagnostic Studies: No results found.  Microbiology: Recent Results (from the past 240 hour(s))  SARS CORONAVIRUS 2 (TAT 6-24 HRS) Nasopharyngeal Nasopharyngeal Swab     Status: None   Collection Time: 12/22/20  6:54 PM   Specimen: Nasopharyngeal Swab  Result Value Ref Range Status   SARS Coronavirus 2 NEGATIVE NEGATIVE Final    Comment: (NOTE) SARS-CoV-2 target nucleic acids are NOT DETECTED.  The SARS-CoV-2 RNA is generally detectable in upper and lower respiratory specimens during the acute phase of infection. Negative results do not preclude SARS-CoV-2 infection, do not rule out co-infections with other pathogens, and should not be used as the sole basis for treatment or other patient management decisions. Negative results must be combined with clinical observations, patient history, and epidemiological information. The expected result is Negative.  Fact Sheet for Patients: HairSlick.no  Fact Sheet for Healthcare Providers: quierodirigir.com  This test is not yet approved  or cleared by the [place] and  has been authorized for detection and/or diagnosis of SARS-CoV-2 by FDA under an Emergency Use Authorization (EUA). This EUA will remain  in effect (meaning this test can be used) for the duration of the COVID-19 declaration under Se ction 564(b)(1) of the Act, 21 U.S.C. section 360bbb-3(b)(1), unless the authorization is terminated or revoked sooner.  Performed at [facility], [address], [place], [place] [address]      Labs: Basic Metabolic Panel: Recent Labs  Lab 12/22/20 1547 12/23/20 0538  NA 140 140  K 4.4 4.2  CL 108 108  CO2 26 26  GLUCOSE 79 105*  BUN 11 11  CREATININE 0.89 0.85  CALCIUM 9.1 8.6*   Liver Function Tests: Recent Labs  Lab 12/22/20 1547 12/23/20 0538  AST 16 19  ALT 12 12  ALKPHOS 32* 29*  BILITOT 1.2 0.9  PROT 7.1 6.8  ALBUMIN 4.2 4.0    No results for input(s): LIPASE, AMYLASE in the last 168 hours. No results for input(s): AMMONIA in the last 168 hours. CBC: Recent Labs  Lab 12/22/20 1547 12/23/20 0538  WBC 8.9 9.9  NEUTROABS 3.6 3.1  HGB 10.5* 9.2*  HCT 27.8* 25.5*  MCV 86.1 88.5  PLT 426* 360   Cardiac Enzymes: No results for input(s): CKTOTAL, CKMB, CKMBINDEX, TROPONINI in the last 168 hours. BNP: Invalid input(s): POCBNP CBG: No results for input(s): GLUCAP in the last 168 hours.  Time coordinating discharge: 30 minutes  Signed:  [name]  APRN, MSN, FNP-C Patient Care [facility] [address] [place], [place] [address] [phone]  [facility] 12/23/2020, 4:34 PM

## 2020-12-23 NOTE — Progress Notes (Signed)
Reviewed PDMP substance reporting system prior to prescribing opiate medications. No inconsistencies noted.  Meds ordered this encounter  Medications   Oxycodone HCl 10 MG TABS    Sig: Take 1 tablet (10 mg total) by mouth every 6 (six) hours as needed (pain).    Dispense:  60 tablet    Refill:  0    Order Specific Question:   Supervising Provider    Answer:   [name], [name]   Kaileia Flow Moore Evalyse Stroope  APRN, MSN, FNP-C Patient Care [facility] [address]  [place], [place] [address] [phone]  

## 2020-12-23 NOTE — Telephone Encounter (Signed)
Oxycodone 10 mg  Please call it in the [facility]   Other [facility] didn't have it in STOCK!!

## 2020-12-23 NOTE — Plan of Care (Signed)
  Problem: Safety: Goal: Ability to remain free from injury will improve Outcome: Progressing   Problem: Activity: Goal: Risk for activity intolerance will decrease Outcome: Not Progressing   Problem: Pain Managment: Goal: General experience of comfort will improve Outcome: Not Progressing   

## 2020-12-31 ENCOUNTER — Telehealth: Payer: Self-pay

## 2020-12-31 NOTE — Telephone Encounter (Signed)
Oxycodone 10mg

## 2021-01-01 ENCOUNTER — Other Ambulatory Visit: Payer: Self-pay | Admitting: Internal Medicine

## 2021-01-01 DIAGNOSIS — G894 Chronic pain syndrome: Secondary | ICD-10-CM

## 2021-01-01 MED ORDER — OXYCODONE HCL ER 10 MG PO T12A: 10.0000 mg | EXTENDED_RELEASE_TABLET | Freq: Two times a day (BID) | ORAL | 0 refills | Status: AC

## 2021-01-04 ENCOUNTER — Telehealth: Payer: Self-pay

## 2021-01-04 NOTE — Telephone Encounter (Signed)
Oxycodone 10mg

## 2021-01-05 ENCOUNTER — Other Ambulatory Visit: Payer: Self-pay | Admitting: Family Medicine

## 2021-01-05 ENCOUNTER — Other Ambulatory Visit: Payer: Self-pay

## 2021-01-05 DIAGNOSIS — G894 Chronic pain syndrome: Secondary | ICD-10-CM

## 2021-01-05 MED ORDER — OXYCODONE HCL 10 MG PO TABS: 10.0000 mg | ORAL_TABLET | Freq: Four times a day (QID) | ORAL | 0 refills | Status: DC | PRN

## 2021-01-05 NOTE — Progress Notes (Signed)
Reviewed PDMP substance reporting system prior to prescribing opiate medications. No inconsistencies noted.  Meds ordered this encounter  Medications   Oxycodone HCl 10 MG TABS    Sig: Take 1 tablet (10 mg total) by mouth every 6 (six) hours as needed (pain).    Dispense:  60 tablet    Refill:  0    Order Specific Question:   Supervising Provider    Answer:   [name], [name]   Lindsey Catano Moore Jarryd Gratz  APRN, MSN, FNP-C Patient Care Center [facility] [address]  [place], [place] [address] [phone]  

## 2021-01-07 ENCOUNTER — Other Ambulatory Visit: Payer: Self-pay | Admitting: Family Medicine

## 2021-01-17 ENCOUNTER — Encounter (HOSPITAL_COMMUNITY): Payer: Self-pay | Admitting: Emergency Medicine

## 2021-01-17 ENCOUNTER — Other Ambulatory Visit: Payer: Self-pay

## 2021-01-17 ENCOUNTER — Inpatient Hospital Stay (HOSPITAL_COMMUNITY)
Admission: EM | Admit: 2021-01-17 | Discharge: 2021-01-20 | DRG: 812 | Disposition: A | Discharge status: Home / Self Care | Payer: Medicaid Other | Attending: Internal Medicine | Admitting: Internal Medicine

## 2021-01-17 ENCOUNTER — Emergency Department (HOSPITAL_COMMUNITY): Payer: Medicaid Other

## 2021-01-17 DIAGNOSIS — Z79899 Other long term (current) drug therapy: Secondary | ICD-10-CM

## 2021-01-17 DIAGNOSIS — G894 Chronic pain syndrome: Secondary | ICD-10-CM | POA: Diagnosis present

## 2021-01-17 DIAGNOSIS — D5700 Hb-SS disease with crisis, unspecified: Secondary | ICD-10-CM | POA: Diagnosis not present

## 2021-01-17 DIAGNOSIS — F17290 Nicotine dependence, other tobacco product, uncomplicated: Secondary | ICD-10-CM | POA: Diagnosis present

## 2021-01-17 DIAGNOSIS — F1120 Opioid dependence, uncomplicated: Secondary | ICD-10-CM | POA: Diagnosis present

## 2021-01-17 DIAGNOSIS — F1121 Opioid dependence, in remission: Secondary | ICD-10-CM | POA: Diagnosis present

## 2021-01-17 DIAGNOSIS — F329 Major depressive disorder, single episode, unspecified: Secondary | ICD-10-CM | POA: Diagnosis present

## 2021-01-17 DIAGNOSIS — D571 Sickle-cell disease without crisis: Secondary | ICD-10-CM | POA: Diagnosis present

## 2021-01-17 DIAGNOSIS — F419 Anxiety disorder, unspecified: Secondary | ICD-10-CM | POA: Diagnosis present

## 2021-01-17 DIAGNOSIS — Z20822 Contact with and (suspected) exposure to covid-19: Secondary | ICD-10-CM | POA: Diagnosis present

## 2021-01-17 DIAGNOSIS — M549 Dorsalgia, unspecified: Secondary | ICD-10-CM | POA: Diagnosis not present

## 2021-01-17 DIAGNOSIS — F320 Major depressive disorder, single episode, mild: Secondary | ICD-10-CM | POA: Diagnosis present

## 2021-01-17 DIAGNOSIS — D638 Anemia in other chronic diseases classified elsewhere: Secondary | ICD-10-CM | POA: Diagnosis present

## 2021-01-17 DIAGNOSIS — N179 Acute kidney failure, unspecified: Secondary | ICD-10-CM | POA: Diagnosis present

## 2021-01-17 LAB — BASIC METABOLIC PANEL
Anion gap: 6 (ref 5–15)
BUN: 13 mg/dL (ref 6–20)
CO2: 26 mmol/L (ref 22–32)
Calcium: 9.5 mg/dL (ref 8.9–10.3)
Chloride: 110 mmol/L (ref 98–111)
Creatinine, Ser: 0.88 mg/dL (ref 0.44–1.00)
GFR, Estimated: >60 mL/min (ref >60)
Glucose, Bld: 100 mg/dL — ABNORMAL HIGH (ref 70–99)
Potassium: 4.3 mmol/L (ref 3.5–5.1)
Sodium: 142 mmol/L (ref 135–145)

## 2021-01-17 LAB — CBC WITH DIFFERENTIAL/PLATELET
Abs Immature Granulocytes: 0.04 10*3/uL (ref 0.00–0.07)
Basophils Absolute: 0.1 10*3/uL (ref 0.0–0.1)
Basophils Relative: 1 %
Eosinophils Absolute: 0.7 10*3/uL — ABNORMAL HIGH (ref 0.0–0.5)
Eosinophils Relative: 6 %
HCT: 28.1 % — ABNORMAL LOW (ref 36.0–46.0)
Hemoglobin: 10.6 g/dL — ABNORMAL LOW (ref 12.0–15.0)
Immature Granulocytes: 0 %
Lymphocytes Relative: 30 %
Lymphs Abs: 3.2 10*3/uL (ref 0.7–4.0)
MCH: 32.3 pg (ref 26.0–34.0)
MCHC: 37.7 g/dL — ABNORMAL HIGH (ref 30.0–36.0)
MCV: 85.7 fL (ref 80.0–100.0)
Monocytes Absolute: 0.7 10*3/uL (ref 0.1–1.0)
Monocytes Relative: 7 %
Neutro Abs: 5.9 10*3/uL (ref 1.7–7.7)
Neutrophils Relative %: 56 %
Platelets: 418 10*3/uL — ABNORMAL HIGH (ref 150–400)
RBC: 3.28 MIL/uL — ABNORMAL LOW (ref 3.87–5.11)
RDW: 13.8 % (ref 11.5–15.5)
WBC: 10.5 10*3/uL (ref 4.0–10.5)
nRBC: 0.4 % — ABNORMAL HIGH (ref 0.0–0.2)

## 2021-01-17 LAB — RETICULOCYTES
Immature Retic Fract: 27.4 % — ABNORMAL HIGH (ref 2.3–15.9)
RBC.: 3.24 MIL/uL — ABNORMAL LOW (ref 3.87–5.11)
Retic Count, Absolute: 113.1 10*3/uL (ref 19.0–186.0)
Retic Ct Pct: 3.5 % — ABNORMAL HIGH (ref 0.4–3.1)

## 2021-01-17 LAB — I-STAT BETA HCG BLOOD, ED (MC, WL, AP ONLY): I-stat hCG, quantitative: <5 m[IU]/mL (ref <5)

## 2021-01-17 MED ORDER — ONDANSETRON HCL 4 MG/2ML IJ SOLN
4.0000 mg | INTRAMUSCULAR | Status: DC | PRN
  Administered 2021-01-17: 4 mg via INTRAVENOUS
  Filled 2021-01-17: qty 2

## 2021-01-17 MED ORDER — NALOXONE HCL 0.4 MG/ML IJ SOLN: 0.4000 mg | INTRAMUSCULAR | Status: DC | PRN

## 2021-01-17 MED ORDER — DIPHENHYDRAMINE HCL 25 MG PO CAPS: 25.0000 mg | ORAL_CAPSULE | ORAL | Status: DC | PRN

## 2021-01-17 MED ORDER — KETAMINE HCL 50 MG/5ML IJ SOSY
0.3000 mg/kg | PREFILLED_SYRINGE | Freq: Once | INTRAMUSCULAR | Status: AC
  Administered 2021-01-17: 20 mg via INTRAVENOUS
  Filled 2021-01-17: qty 5

## 2021-01-17 MED ORDER — HYDROMORPHONE HCL 2 MG/ML IJ SOLN
2.0000 mg | INTRAMUSCULAR | Status: AC
  Administered 2021-01-17: 2 mg via INTRAVENOUS
  Filled 2021-01-17: qty 1

## 2021-01-17 MED ORDER — GABAPENTIN 300 MG PO CAPS
400.0000 mg | ORAL_CAPSULE | Freq: Three times a day (TID) | ORAL | Status: DC
  Administered 2021-01-17 – 2021-01-20 (×10): 400 mg via ORAL
  Filled 2021-01-17 (×10): qty 1

## 2021-01-17 MED ORDER — POLYETHYLENE GLYCOL 3350 17 G PO PACK: 17.0000 g | PACK | Freq: Every day | ORAL | Status: DC | PRN

## 2021-01-17 MED ORDER — SODIUM CHLORIDE 0.9 % IV SOLN
25.0000 mg | INTRAVENOUS | Status: DC | PRN
  Filled 2021-01-17: qty 0.5

## 2021-01-17 MED ORDER — OXYCODONE HCL ER 10 MG PO T12A
10.0000 mg | EXTENDED_RELEASE_TABLET | Freq: Two times a day (BID) | ORAL | Status: DC
  Administered 2021-01-17 – 2021-01-20 (×7): 10 mg via ORAL
  Filled 2021-01-17 (×7): qty 1

## 2021-01-17 MED ORDER — SODIUM CHLORIDE 0.9% FLUSH: 9.0000 mL | INTRAVENOUS | Status: DC | PRN

## 2021-01-17 MED ORDER — ENOXAPARIN SODIUM 40 MG/0.4ML IJ SOSY
40.0000 mg | PREFILLED_SYRINGE | Freq: Every day | INTRAMUSCULAR | Status: DC
  Administered 2021-01-17 – 2021-01-20 (×4): 40 mg via SUBCUTANEOUS
  Filled 2021-01-17 (×4): qty 0.4

## 2021-01-17 MED ORDER — KETOROLAC TROMETHAMINE 15 MG/ML IJ SOLN
15.0000 mg | INTRAMUSCULAR | Status: AC
  Administered 2021-01-17: 15 mg via INTRAVENOUS
  Filled 2021-01-17: qty 1

## 2021-01-17 MED ORDER — ONDANSETRON HCL 4 MG/2ML IJ SOLN
4.0000 mg | Freq: Four times a day (QID) | INTRAMUSCULAR | Status: DC | PRN
  Administered 2021-01-17 – 2021-01-19 (×2): 4 mg via INTRAVENOUS
  Filled 2021-01-17 (×2): qty 2

## 2021-01-17 MED ORDER — DEXTROSE-NACL 5-0.45 % IV SOLN: INTRAVENOUS | Status: DC

## 2021-01-17 MED ORDER — SENNOSIDES-DOCUSATE SODIUM 8.6-50 MG PO TABS
1.0000 | ORAL_TABLET | Freq: Two times a day (BID) | ORAL | Status: DC
  Administered 2021-01-17 – 2021-01-20 (×7): 1 via ORAL
  Filled 2021-01-17 (×7): qty 1

## 2021-01-17 MED ORDER — HYDROMORPHONE HCL 2 MG/ML IJ SOLN
2.0000 mg | INTRAMUSCULAR | Status: AC | PRN
  Administered 2021-01-17 – 2021-01-18 (×3): 2 mg via INTRAVENOUS
  Filled 2021-01-17 (×3): qty 1

## 2021-01-17 MED ORDER — DIPHENHYDRAMINE HCL 50 MG/ML IJ SOLN
25.0000 mg | Freq: Once | INTRAMUSCULAR | Status: AC
  Administered 2021-01-17: 25 mg via INTRAVENOUS
  Filled 2021-01-17: qty 1

## 2021-01-17 MED ORDER — KETOROLAC TROMETHAMINE 30 MG/ML IJ SOLN
30.0000 mg | Freq: Four times a day (QID) | INTRAMUSCULAR | Status: DC
  Administered 2021-01-17 – 2021-01-18 (×5): 30 mg via INTRAVENOUS
  Filled 2021-01-17 (×5): qty 1

## 2021-01-17 MED ORDER — HYDROMORPHONE 1 MG/ML IV SOLN
INTRAVENOUS | Status: DC
Start: 2021-01-17 — End: 2021-01-19
  Administered 2021-01-17: 3.5 mg via INTRAVENOUS
  Administered 2021-01-17 – 2021-01-18 (×3): 3 mg via INTRAVENOUS
  Administered 2021-01-18 – 2021-01-19 (×2): 1 mg via INTRAVENOUS
  Administered 2021-01-19: 0.5 mg via INTRAVENOUS
  Administered 2021-01-19: 1.5 mg via INTRAVENOUS
  Administered 2021-01-19: 2 mg via INTRAVENOUS
  Filled 2021-01-17: qty 30

## 2021-01-17 NOTE — ED Triage Notes (Signed)
Patient arrives complaining of sickle cell crisis. Left shoulder and bilateral leg pain. Patient attempted home meds without success.

## 2021-01-17 NOTE — Plan of Care (Signed)

## 2021-01-17 NOTE — H&P (Signed)
Lindsey [name] is an 50 y.o. female.   Chief Complaint: Pain in back and legs for 4 days  HPI: Patient is well-known to our service.  She is a 50 year old female with known history of sickle cell disease who presented to the ER with pain in her back legs that has been persistent.  Pain is rated as 10 out of 10.  Consistent with her typical sickle cell pain.  She has taken her home regimen but no response.  Patient was seen in the ER and evaluated.  She denied any nausea vomiting or diarrhea.  She denied any fever or chills or sick contact.  Patient was treated with up to 6 mg of Dilaudid IV but no relief of her pain.  At this point she is being admitted to the hospital for further evaluation and treatment.  Denied any other complaints.  She was given ketamine also in the ER that palpation has made her feel weak.  We will admit the patient for inpatient care.  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   Opioid dependence in remission (HCC)    Sickle cell crisis (HCC)     Past Surgical History:  Procedure Laterality Date   APPENDECTOMY     CESAREAN SECTION     OTHER SURGICAL HISTORY     c-section    Family History  Problem Relation Age of Onset   Stroke Neg Hx        none that she knows of    Seizures Neg Hx    Social History:  reports that she has been smoking cigars. She has never used smokeless tobacco. She reports current alcohol use. She reports that she does not use drugs.  Allergies: No Known Allergies  (Not in a hospital admission)   Results for orders placed or performed during the hospital encounter of 01/17/21 (from the past 48 hour(s))  CBC WITH DIFFERENTIAL     Status: Abnormal   Collection Time: 01/17/21  7:47 AM  Result Value Ref Range   WBC 10.5 4.0 - 10.5 K/uL   RBC 3.28 (L) 3.87 - 5.11 MIL/uL   Hemoglobin 10.6 (L) 12.0 - 15.0 g/dL   HCT 19.1 (L) 47.8 - 29.5 %   MCV 85.7 80.0 - 100.0 fL   MCH 32.3 26.0 - 34.0 pg    MCHC 37.7 (H) 30.0 - 36.0 g/dL    Comment: SICKLE CELL PATIENT   RDW 13.8 11.5 - 15.5 %   Platelets 418 (H) 150 - 400 K/uL   nRBC 0.4 (H) 0.0 - 0.2 %   Neutrophils Relative % 56 %   Neutro Abs 5.9 1.7 - 7.7 K/uL   Lymphocytes Relative 30 %   Lymphs Abs 3.2 0.7 - 4.0 K/uL   Monocytes Relative 7 %   Monocytes Absolute 0.7 0.1 - 1.0 K/uL   Eosinophils Relative 6 %   Eosinophils Absolute 0.7 (H) 0.0 - 0.5 K/uL   Basophils Relative 1 %   Basophils Absolute 0.1 0.0 - 0.1 K/uL   Immature Granulocytes 0 %   Abs Immature Granulocytes 0.04 0.00 - 0.07 K/uL    Comment: Performed at [facility], [address] [place], [place] [address]  Basic metabolic panel     Status: Abnormal   Collection Time: 01/17/21  7:47 AM  Result Value Ref Range   Sodium 142 135 - 145 mmol/L   Potassium 4.3 3.5 - 5.1 mmol/L   Chloride 110  98 - 111 mmol/L   CO2 26 22 - 32 mmol/L   Glucose, Bld 100 (H) 70 - 99 mg/dL    Comment: Glucose reference range applies only to samples taken after fasting for at least 8 hours.   BUN 13 6 - 20 mg/dL   Creatinine, Ser 4.32 0.44 - 1.00 mg/dL   Calcium 9.5 8.9 - 76.1 mg/dL   GFR, Estimated >47 >09 mL/min    Comment: (NOTE) Calculated using the CKD-EPI Creatinine Equation (2021)    Anion gap 6 5 - 15    Comment: Performed at [facility], [address] [place], [place] [address]  Reticulocytes     Status: Abnormal   Collection Time: 01/17/21  7:48 AM  Result Value Ref Range   Retic Ct Pct 3.5 (H) 0.4 - 3.1 %   RBC. 3.24 (L) 3.87 - 5.11 MIL/uL   Retic Count, Absolute 113.1 19.0 - 186.0 K/uL   Immature Retic Fract 27.4 (H) 2.3 - 15.9 %    Comment: Performed at [facility], [address], [place], [place] [address]  I-Stat beta hCG blood, ED     Status: None   Collection Time: 01/17/21  7:50 AM  Result Value Ref Range   I-stat hCG, quantitative <5.0 <5 mIU/mL   Comment 3            Comment:   GEST. AGE       CONC.  (mIU/mL)   <=1 WEEK        5 - 50     2 WEEKS       50 - 500     3 WEEKS       100 - 10,000     4 WEEKS     1,000 - 30,000        FEMALE AND NON-PREGNANT FEMALE:     LESS THAN 5 mIU/mL    DG Chest Port 1 View  Result Date: 01/17/2021 CLINICAL DATA:  50 year old female with sickle cell pain crisis. EXAM: PORTABLE CHEST 1 VIEW COMPARISON:  Chest radiographs 08/25/2020 and earlier. FINDINGS: Portable AP semi upright view at 0754 hours. Stable borderline cardiomegaly. Other mediastinal contours are within normal limits. Visualized tracheal air column is within normal limits. Patchy asymmetric opacity at the right lung base was present in April and appears only mildly increased. Elsewhere Allowing for portable technique the lungs are clear. No pneumothorax or pleural effusion. Stable visualized osseous structures. H shaped vertebra. Chronic humeral head sclerosis. Negative visible bowel gas pattern. IMPRESSION: 1. Patchy right lung base opacity appears only mildly increased since April, favor chronic scarring over acute chest syndrome. 2. No other acute cardiopulmonary abnormality. Electronically Signed   By: Odessa Fleming M.D.   On: 01/17/2021 08:04    Review of Systems  Constitutional: Negative.   HENT: Negative.    Eyes: Negative.   Respiratory: Negative.    Cardiovascular: Negative.   Gastrointestinal: Negative.   Endocrine: Negative.   Genitourinary: Negative.   Musculoskeletal:  Positive for arthralgias and myalgias.  Skin: Negative.   Hematological: Negative.   Psychiatric/Behavioral: Negative.     Blood pressure 100/60, pulse 78, temperature 98.2 F (36.8 C), temperature source Oral, resp. rate 16, height 5' (1.524 m), weight 68 kg, last menstrual period 11/19/2020, SpO2 97 %. Physical Exam Constitutional:      Appearance: She is ill-appearing.  HENT:     Head: Normocephalic and atraumatic.     Right Ear: Tympanic membrane normal.  Left Ear: Tympanic membrane normal.      Nose: Nose normal.     Mouth/Throat:     Mouth: Mucous membranes are moist.  Eyes:     Extraocular Movements: Extraocular movements intact.     Pupils: Pupils are equal, round, and reactive to light.  Cardiovascular:     Rate and Rhythm: Normal rate and regular rhythm.     Pulses: Normal pulses.     Heart sounds: Normal heart sounds.  Pulmonary:     Effort: Pulmonary effort is normal.     Breath sounds: Normal breath sounds.  Abdominal:     General: Bowel sounds are normal.     Palpations: Abdomen is soft.  Musculoskeletal:        General: Tenderness present. Normal range of motion.     Cervical back: Normal range of motion and neck supple.  Skin:    General: Skin is warm and dry.  Neurological:     General: No focal deficit present.     Mental Status: She is alert.     Assessment/Plan A 50 year old female with known history of sickle cell disease here with sickle cell painful crisis.  #1 sickle cell painful crisis: Patient will be admitted for sickle cell painful management.  IV Dilaudid PCA at 0.5 mg bolus dose with a lockout period of 10 minutes maximum 3 mg/h.  IV D5 half-normal saline, Toradol and supportive care.  Patient will likely be transition to oral regimen soon.  Continue with her chronic pain management.  #2 anemia of chronic disease: Monitor the H&H.  #3 chronic pain syndrome: We will continue with OxyContin.  #4 depression with anxiety: Continue home regimen.  Patient has not been taking any new medicine.  So we will proceed with counseling mainly.  Lonia Blood, MD 01/17/2021, 10:09 AM

## 2021-01-17 NOTE — ED Provider Notes (Signed)
[facility]-EMERGENCY DEPT Provider Note   CSN: [id number] Arrival date & time: 01/17/21  3614     History Chief Complaint  Patient presents with   Sickle Cell Pain Crisis    Lindsey [name] is a 50 y.o. female.   Sickle Cell Pain Crisis Location:  Lower extremity Severity:  Severe Duration:  2 days Similar to previous crisis episodes: yes   Timing:  Constant Progression:  Worsening Chronicity:  Chronic Associated symptoms: no chest pain, no cough, no fever, no shortness of breath, no sore throat and no vomiting    50 year old female presenting to the emergency department with concern for sickle cell pain crisis.  The patient states that for the past 2 days she has had worsening left shoulder and bilateral leg pain despite her home NSAID and oxycodone use.  She denies any chest pain or shortness of breath.  She denies any fevers or chills.  She was last hospitalized for sickle cell pain crisis on 8/16 to 12/23/2020.  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   Opioid dependence in remission (HCC)    Sickle cell crisis [facility])     Patient Active Problem List   Diagnosis Date Noted   Cough 06/27/2020   Hypokalemia 06/27/2020   Acute pain of left shoulder    Right leg pain    Sickle cell anemia with crisis (HCC) 09/10/2019   Anemia of chronic disease 09/10/2019   Current mild episode of major depressive disorder (HCC)    Sickle cell crisis (HCC) 02/28/2019   Opioid dependence in remission (HCC) 10/01/2017   Sickle cell disease (HCC) 10/01/2017   Chronic pain 10/01/2017   Opiate abuse, episodic (HCC) 09/25/2017   Leukocytosis 09/03/2016   Sickle cell pain crisis (HCC) 07/20/2012   Sickle cell anemia with pain (HCC) 05/10/2012    Past Surgical History:  Procedure Laterality Date   APPENDECTOMY     CESAREAN SECTION     OTHER SURGICAL HISTORY     c-section     OB History   No obstetric history on  file.     Family History  Problem Relation Age of Onset   Stroke Neg Hx        none that she knows of    Seizures Neg Hx     Social History   Tobacco Use   Smoking status: Every Day    Packs/day: 0.00    Types: Cigars, Cigarettes   Smokeless tobacco: Never  Vaping Use   Vaping Use: Former  Substance Use Topics   Alcohol use: Yes    Comment: occasionally   Drug use: No    Home Medications Prior to Admission medications   Medication Sig Start Date End Date Taking? Authorizing Provider  gabapentin (NEURONTIN) 400 MG capsule Take 1 capsule (400 mg total) by mouth 3 (three) times daily. 12/01/20  Yes Massie Maroon, FNP  ibuprofen (ADVIL) 800 MG tablet Take 1 tablet (800 mg total) by mouth every 8 (eight) hours as needed. TAKE 1 TABLET(800 MG) BY MOUTH EVERY 8 HOURS AS NEEDED 11/30/20  Yes Massie Maroon, FNP  oxyCODONE (OXYCONTIN) 10 mg 12 hr tablet Take 1 tablet (10 mg total) by mouth every 12 (twelve) hours. 01/01/21 01/31/21 Yes [name], Olugbemiga E, MD  albuterol (VENTOLIN HFA) 108 (90 Base) MCG/ACT inhaler INHALE 2 PUFFS INTO THE LUNGS EVERY 6 HOURS AS NEEDED FOR WHEEZING OR SHORTNESS OF BREATH Patient not taking: No sig reported  06/01/20   Massie Maroon, FNP  ergocalciferol (VITAMIN D2) 1.25 MG (50000 UT) capsule Take 1 capsule (50,000 Units total) by mouth once a week. Patient not taking: No sig reported 10/16/19   Massie Maroon, FNP  Oxycodone HCl 10 MG TABS Take 1 tablet (10 mg total) by mouth every 6 (six) hours as needed (pain). Patient not taking: Reported on 01/17/2021 01/05/21   Massie Maroon, FNP    Allergies    Patient has no known allergies.  Review of Systems   Review of Systems  Constitutional:  Negative for chills and fever.  HENT:  Negative for ear pain and sore throat.   Eyes:  Negative for pain and visual disturbance.  Respiratory:  Negative for cough and shortness of breath.   Cardiovascular:  Negative for chest pain and palpitations.   Gastrointestinal:  Negative for abdominal pain and vomiting.  Genitourinary:  Negative for dysuria and hematuria.  Musculoskeletal:  Positive for arthralgias. Negative for back pain.  Skin:  Negative for color change and rash.  Neurological:  Negative for seizures and syncope.  All other systems reviewed and are negative.  Physical Exam Updated Vital Signs BP 93/74   Pulse 70   Temp 98.2 F (36.8 C) (Oral)   Resp 16   Ht 5' (1.524 m)   Wt 68 kg   LMP 11/19/2020   SpO2 95%   BMI 29.29 kg/m   Physical Exam Vitals and nursing note reviewed.  Constitutional:      General: She is not in acute distress.    Comments: Tearful and in pain  HENT:     Head: Normocephalic and atraumatic.  Eyes:     Conjunctiva/sclera: Conjunctivae normal.     Pupils: Pupils are equal, round, and reactive to light.  Cardiovascular:     Rate and Rhythm: Normal rate and regular rhythm.  Pulmonary:     Effort: Pulmonary effort is normal. No respiratory distress.  Abdominal:     General: There is no distension.     Tenderness: There is no guarding.  Musculoskeletal:        General: No deformity or signs of injury.     Cervical back: Normal range of motion and neck supple.  Skin:    Findings: No lesion or rash.  Neurological:     General: No focal deficit present.     Mental Status: She is alert. Mental status is at baseline.    ED Results / Procedures / Treatments   Labs (all labs ordered are listed, but only abnormal results are displayed) Labs Reviewed  CBC WITH DIFFERENTIAL/PLATELET - Abnormal; Notable for the following components:      Result Value   RBC 3.28 (*)    Hemoglobin 10.6 (*)    HCT 28.1 (*)    MCHC 37.7 (*)    Platelets 418 (*)    nRBC 0.4 (*)    Eosinophils Absolute 0.7 (*)    All other components within normal limits  RETICULOCYTES - Abnormal; Notable for the following components:   Retic Ct Pct 3.5 (*)    RBC. 3.24 (*)    Immature Retic Fract 27.4 (*)    All other  components within normal limits  BASIC METABOLIC PANEL - Abnormal; Notable for the following components:   Glucose, Bld 100 (*)    All other components within normal limits  I-STAT BETA HCG BLOOD, ED ([facility], [facility], AP ONLY)    EKG None  Radiology DG Chest Port 1  View  Result Date: 01/17/2021 CLINICAL DATA:  50 year old female with sickle cell pain crisis. EXAM: PORTABLE CHEST 1 VIEW COMPARISON:  Chest radiographs 08/25/2020 and earlier. FINDINGS: Portable AP semi upright view at 0754 hours. Stable borderline cardiomegaly. Other mediastinal contours are within normal limits. Visualized tracheal air column is within normal limits. Patchy asymmetric opacity at the right lung base was present in April and appears only mildly increased. Elsewhere Allowing for portable technique the lungs are clear. No pneumothorax or pleural effusion. Stable visualized osseous structures. H shaped vertebra. Chronic humeral head sclerosis. Negative visible bowel gas pattern. IMPRESSION: 1. Patchy right lung base opacity appears only mildly increased since April, favor chronic scarring over acute chest syndrome. 2. No other acute cardiopulmonary abnormality. Electronically Signed   By: Odessa Fleming M.D.   On: 01/17/2021 08:04    Procedures Procedures   Medications Ordered in ED Medications  ondansetron (ZOFRAN) injection 4 mg (4 mg Intravenous Given 01/17/21 0739)  ketorolac (TORADOL) 15 MG/ML injection 15 mg (15 mg Intravenous Given 01/17/21 0741)  HYDROmorphone (DILAUDID) injection 2 mg (2 mg Intravenous Given 01/17/21 0740)  HYDROmorphone (DILAUDID) injection 2 mg (2 mg Intravenous Given 01/17/21 0821)  ketamine 50 mg in normal saline 5 mL (10 mg/mL) syringe (20 mg Intravenous Given by Other 01/17/21 0756)  diphenhydrAMINE (BENADRYL) injection 25 mg (25 mg Intravenous Given 01/17/21 0740)    ED Course  I have reviewed the triage vital signs and the nursing notes.  Pertinent labs & imaging results that were available during  my care of the patient were reviewed by me and considered in my medical decision making (see chart for details).    MDM Rules/Calculators/A&P                           50 year old female with a history of sickle cell disease presenting to the emergency department with concern for typical sickle cell pain consistent with pain crisis.  Patient denies any chest pain, fever chills, shortness of breath.  She has a history of acute chest syndrome but has no symptoms at this time.  Pain has been persistent despite her home NSAID and opiate use.  We will plan to treat the patient for sickle cell pain crisis per ED protocol.  Administered IV Toradol, IV Zofran, IV Dilaudid, IV ketamine and Benadryl.   Chest x-ray with a patchy opacity, favored to reflect chronic scarring.  Labs obtained and reviewed: The patient's reticulocyte count appears to be at baseline at 3.5, hemoglobin also appears to be at and above baseline at 10.6, CBC without a leukocytosis, BMP without an electrolyte abnormality, normal renal function.  On reassessment, the patient still endorsed persistent pain despite the above interventions.  Discussed continue to manage her pain at home versus in the hospital.  The patient felt concerned that if she were to go home she would just be back again in the emergency department later due to persistent pain.  Hospitalist medicine consulted for admission for sickle cell pain crisis.  Final Clinical Impression(s) / ED Diagnoses Final diagnoses:  Sickle cell pain crisis [facility])    Rx / DC Orders ED Discharge Orders     None        Ernie Avena, MD 01/17/21 [phone]

## 2021-01-17 NOTE — ED Notes (Signed)
Dr Mikeal Hawthorne called for Dr Karene Fry.

## 2021-01-18 DIAGNOSIS — N179 Acute kidney failure, unspecified: Secondary | ICD-10-CM

## 2021-01-18 DIAGNOSIS — D5700 Hb-SS disease with crisis, unspecified: Principal | ICD-10-CM

## 2021-01-18 HISTORY — DX: Acute kidney failure, unspecified: N17.9

## 2021-01-18 LAB — COMPREHENSIVE METABOLIC PANEL
ALT: 20 U/L (ref 0–44)
AST: 31 U/L (ref 15–41)
Albumin: 4.4 g/dL (ref 3.5–5.0)
Alkaline Phosphatase: 37 U/L — ABNORMAL LOW (ref 38–126)
Anion gap: 8 (ref 5–15)
BUN: 18 mg/dL (ref 6–20)
CO2: 26 mmol/L (ref 22–32)
Calcium: 8.5 mg/dL — ABNORMAL LOW (ref 8.9–10.3)
Chloride: 105 mmol/L (ref 98–111)
Creatinine, Ser: 1.34 mg/dL — ABNORMAL HIGH (ref 0.44–1.00)
GFR, Estimated: 49 mL/min — ABNORMAL LOW (ref >60)
Glucose, Bld: 107 mg/dL — ABNORMAL HIGH (ref 70–99)
Potassium: 4.3 mmol/L (ref 3.5–5.1)
Sodium: 139 mmol/L (ref 135–145)
Total Bilirubin: 1.4 mg/dL — ABNORMAL HIGH (ref 0.3–1.2)
Total Protein: 7.2 g/dL (ref 6.5–8.1)

## 2021-01-18 LAB — CBC WITH DIFFERENTIAL/PLATELET
Abs Immature Granulocytes: 0.14 10*3/uL — ABNORMAL HIGH (ref 0.00–0.07)
Basophils Absolute: 0.1 10*3/uL (ref 0.0–0.1)
Basophils Relative: 0 %
Eosinophils Absolute: 0.8 10*3/uL — ABNORMAL HIGH (ref 0.0–0.5)
Eosinophils Relative: 5 %
HCT: 24.1 % — ABNORMAL LOW (ref 36.0–46.0)
Hemoglobin: 9.1 g/dL — ABNORMAL LOW (ref 12.0–15.0)
Immature Granulocytes: 1 %
Lymphocytes Relative: 20 %
Lymphs Abs: 2.7 10*3/uL (ref 0.7–4.0)
MCH: 32.3 pg (ref 26.0–34.0)
MCHC: 37.8 g/dL — ABNORMAL HIGH (ref 30.0–36.0)
MCV: 85.5 fL (ref 80.0–100.0)
Monocytes Absolute: 0.8 10*3/uL (ref 0.1–1.0)
Monocytes Relative: 6 %
Neutro Abs: 9.4 10*3/uL — ABNORMAL HIGH (ref 1.7–7.7)
Neutrophils Relative %: 68 %
Platelets: 308 10*3/uL (ref 150–400)
RBC: 2.82 MIL/uL — ABNORMAL LOW (ref 3.87–5.11)
RDW: 13.9 % (ref 11.5–15.5)
WBC: 13.8 10*3/uL — ABNORMAL HIGH (ref 4.0–10.5)
nRBC: 0.4 % — ABNORMAL HIGH (ref 0.0–0.2)

## 2021-01-18 LAB — RESP PANEL BY RT-PCR (FLU A&B, COVID) ARPGX2
Influenza A by PCR: NEGATIVE
Influenza B by PCR: NEGATIVE
SARS Coronavirus 2 by RT PCR: NEGATIVE

## 2021-01-18 MED ORDER — ACETAMINOPHEN 325 MG PO TABS
650.0000 mg | ORAL_TABLET | Freq: Four times a day (QID) | ORAL | Status: DC | PRN
  Administered 2021-01-18 – 2021-01-19 (×4): 650 mg via ORAL
  Filled 2021-01-18 (×4): qty 2

## 2021-01-18 NOTE — Plan of Care (Signed)
  Problem: Health Behavior/Discharge Planning: Goal: Ability to manage health-related needs will improve Outcome: Progressing   

## 2021-01-18 NOTE — Progress Notes (Signed)
Subjective: Lindsey [name] is a 50 year old female with a medical history significant for sickle cell disease, chronic pain syndrome, opiate dependence and tolerance, and history of anemia of chronic disease was admitted for sickle cell pain crisis. Patient continues to complain pain primarily to left shoulder and left lower extremity.  Pain intensity is 6/10.  Patient denies any headache, chest pain, urinary symptoms, nausea, vomiting, or diarrhea.  Objective:  Vital signs in last 24 hours:  Vitals:   01/18/21 0507 01/18/21 0944 01/18/21 1109 01/18/21 1206  BP: (!) 119/102  (!) 95/57   Pulse: 89  78   Resp: 18 (!) 7 15 14   Temp: 98 F (36.7 C)  98.3 F (36.8 C)   TempSrc: Oral  Oral   SpO2: 99% 100% 95% 95%  Weight:      Height:        Intake/Output from previous day:   Intake/Output Summary (Last 24 hours) at 01/18/2021 1302 Last data filed at 01/18/2021 0951 Gross per 24 hour  Intake 2635.66 ml  Output --  Net 2635.66 ml    Physical Exam: General: Alert, awake, oriented x3, in no acute distress.  HEENT: [place]/AT PEERL, EOMI Neck: Trachea midline,  no masses, no thyromegal,y no JVD, no carotid bruit OROPHARYNX:  Moist, No exudate/ erythema/lesions.  Heart: Regular rate and rhythm, without murmurs, rubs, gallops, PMI non-displaced, no heaves or thrills on palpation.  Lungs: Clear to auscultation, no wheezing or rhonchi noted. No increased vocal fremitus resonant to percussion  Abdomen: Soft, nontender, nondistended, positive bowel sounds, no masses no hepatosplenomegaly noted..  Neuro: No focal neurological deficits noted cranial nerves II through XII grossly intact. DTRs 2+ bilaterally upper and lower extremities. Strength 5 out of 5 in bilateral upper and lower extremities. Musculoskeletal: No warm swelling or erythema around joints, no spinal tenderness noted. Psychiatric: Patient alert and oriented x3, good insight and cognition, good recent to remote recall. Lymph node  survey: No cervical axillary or inguinal lymphadenopathy noted.  Lab Results:  Basic Metabolic Panel:    Component Value Date/Time   NA 139 01/18/2021 0512   NA 140 01/07/2020 1127   K 4.3 01/18/2021 0512   CL 105 01/18/2021 0512   CO2 26 01/18/2021 0512   BUN 18 01/18/2021 0512   BUN 12 01/07/2020 1127   CREATININE 1.34 (H) 01/18/2021 0512   GLUCOSE 107 (H) 01/18/2021 0512   CALCIUM 8.5 (L) 01/18/2021 0512   CBC:    Component Value Date/Time   WBC 13.8 (H) 01/18/2021 0512   HGB 9.1 (L) 01/18/2021 0512   HGB 11.5 01/07/2020 1127   HCT 24.1 (L) 01/18/2021 0512   HCT 34.3 01/07/2020 1127   PLT 308 01/18/2021 0512   PLT 436 01/07/2020 1127   MCV 85.5 01/18/2021 0512   MCV 96 01/07/2020 1127   NEUTROABS 9.4 (H) 01/18/2021 0512   NEUTROABS 7.6 (H) 01/07/2020 1127   LYMPHSABS 2.7 01/18/2021 0512   LYMPHSABS 2.0 01/07/2020 1127   MONOABS 0.8 01/18/2021 0512   EOSABS 0.8 (H) 01/18/2021 0512   EOSABS 0.4 01/07/2020 1127   BASOSABS 0.1 01/18/2021 0512   BASOSABS 0.1 01/07/2020 1127    No results found for this or any previous visit (from the past 240 hour(s)).  Studies/Results: DG Chest Port 1 View  Result Date: 01/17/2021 CLINICAL DATA:  50 year old female with sickle cell pain crisis. EXAM: PORTABLE CHEST 1 VIEW COMPARISON:  Chest radiographs 08/25/2020 and earlier. FINDINGS: Portable AP semi upright view at 0754 hours. Stable borderline  cardiomegaly. Other mediastinal contours are within normal limits. Visualized tracheal air column is within normal limits. Patchy asymmetric opacity at the right lung base was present in April and appears only mildly increased. Elsewhere Allowing for portable technique the lungs are clear. No pneumothorax or pleural effusion. Stable visualized osseous structures. H shaped vertebra. Chronic humeral head sclerosis. Negative visible bowel gas pattern. IMPRESSION: 1. Patchy right lung base opacity appears only mildly increased since April, favor  chronic scarring over acute chest syndrome. 2. No other acute cardiopulmonary abnormality. Electronically Signed   By: Odessa Fleming M.D.   On: 01/17/2021 08:04    Medications: Scheduled Meds:  enoxaparin (LOVENOX) injection  40 mg Subcutaneous Daily   gabapentin  400 mg Oral TID   HYDROmorphone   Intravenous Q4H   oxyCODONE  10 mg Oral Q12H   senna-docusate  1 tablet Oral BID   Continuous Infusions:  dextrose 5 % and 0.45% NaCl 125 mL/hr at 01/18/21 0412   diphenhydrAMINE     PRN Meds:.acetaminophen, diphenhydrAMINE **OR** diphenhydrAMINE, naloxone **AND** sodium chloride flush, ondansetron (ZOFRAN) IV, polyethylene glycol  Consultants: None  Procedures: None  Antibiotics: None  Assessment/Plan: Principal Problem:   Sickle cell anemia with crisis (HCC) Active Problems:   Opioid dependence in remission (HCC)   Current mild episode of major depressive disorder (HCC)   Anemia of chronic disease  Sickle cell disease with pain crisis: Continue IV Dilaudid PCA without any changes D5 0.45% saline at 75 mL/h Discontinue IV Toradol due to acute kidney injury Monitor vital signs very closely, reevaluate pain scale regularly, and supplemental oxygen as needed  Acute kidney injury: Today, creatinine is 1.3.  Discontinue IV Toradol.  Gentle hydration.  Continue to monitor closely.  Avoid all nephrotoxins.  Sickle cell anemia: Hemoglobin is stable and consistent with patient's baseline.  There is no clinical indication for blood transfusions at this time.  Chronic pain syndrome: Continue home medications  Code Status: Full Code Family Communication: N/A Disposition Plan: Not yet ready for discharge  Julea Hutto Rennis Petty  APRN, MSN, FNP-C [facility] [address] [place], [place] [address] [phone]  If 5PM-8AM, please contact night-coverage.  01/18/2021, 1:02 PM  LOS: 1 day

## 2021-01-19 MED ORDER — ACETAMINOPHEN 325 MG PO TABS
650.0000 mg | ORAL_TABLET | ORAL | Status: DC | PRN
  Administered 2021-01-19: 650 mg via ORAL

## 2021-01-19 MED ORDER — HYDROMORPHONE 1 MG/ML IV SOLN
INTRAVENOUS | Status: DC
Start: 2021-01-19 — End: 2021-01-20
  Administered 2021-01-19: 3.5 mg via INTRAVENOUS
  Administered 2021-01-20: 0 mg via INTRAVENOUS
  Administered 2021-01-20 (×2): 1 mg via INTRAVENOUS
  Filled 2021-01-19: qty 30

## 2021-01-19 MED ORDER — OXYCODONE HCL 5 MG PO TABS
10.0000 mg | ORAL_TABLET | ORAL | Status: DC | PRN
  Administered 2021-01-19 – 2021-01-20 (×4): 10 mg via ORAL
  Filled 2021-01-19 (×4): qty 2

## 2021-01-19 NOTE — Progress Notes (Addendum)
Subjective: Lindsey [name] is a 50 year old female with a medical history significant for sickle cell disease, chronic pain syndrome, opiate dependence and tolerance, and history of anemia of chronic disease that was admitted and sickle cell crisis. Patient continues to complain of pain primarily to left shoulder.  She rates her pain as 6-11/2008.  She states that she is not ready for discharge at this time.  She denies any headache, chest pain, urinary symptoms, nausea, vomiting, or diarrhea.  Objective:  Vital signs in last 24 hours:  Vitals:   01/19/21 1155 01/19/21 1317 01/19/21 1440 01/19/21 1647  BP:  105/63  105/69  Pulse:  71  68  Resp: 15 14 14 14   Temp:  99 F (37.2 C)  98.6 F (37 C)  TempSrc:  Oral  Oral  SpO2: 98% 95% 95% 99%  Weight:      Height:        Intake/Output from previous day:   Intake/Output Summary (Last 24 hours) at 01/19/2021 1704 Last data filed at 01/19/2021 0452 Gross per 24 hour  Intake 1968.06 ml  Output --  Net 1968.06 ml    Physical Exam: General: Alert, awake, oriented x3, in no acute distress.  HEENT: [place]/AT PEER[place], EOMI Neck: Trachea midline,  no masses, no thyromegal,y no JVD, no carotid bruit OROPHARYNX:  Moist, No exudate/ erythema/lesions.  Heart: Regular rate and rhythm, without murmurs, rubs, gallops, PMI non-displaced, no heaves or thrills on palpation.  Lungs: Clear to auscultation, no wheezing or rhonchi noted. No increased vocal fremitus resonant to percussion  Abdomen: Soft, nontender, nondistended, positive bowel sounds, no masses no hepatosplenomegaly noted..  Neuro: No focal neurological deficits noted cranial nerves II through XII grossly intact. DTRs 2+ bilaterally upper and lower extremities. Strength 5 out of 5 in bilateral upper and lower extremities. Musculoskeletal: No warm swelling or erythema around joints, no spinal tenderness noted. Psychiatric: Patient alert and oriented x3, good insight and cognition, good recent  to remote recall. Lymph node survey: No cervical axillary or inguinal lymphadenopathy noted.  Lab Results:  Basic Metabolic Panel:    Component Value Date/Time   NA 139 01/18/2021 0512   NA 140 01/07/2020 1127   K 4.3 01/18/2021 0512   CL 105 01/18/2021 0512   CO2 26 01/18/2021 0512   BUN 18 01/18/2021 0512   BUN 12 01/07/2020 1127   CREATININE 1.34 (H) 01/18/2021 0512   GLUCOSE 107 (H) 01/18/2021 0512   CALCIUM 8.5 (L) 01/18/2021 0512   CBC:    Component Value Date/Time   WBC 13.8 (H) 01/18/2021 0512   HGB 9.1 (L) 01/18/2021 0512   HGB 11.5 01/07/2020 1127   HCT 24.1 (L) 01/18/2021 0512   HCT 34.3 01/07/2020 1127   PLT 308 01/18/2021 0512   PLT 436 01/07/2020 1127   MCV 85.5 01/18/2021 0512   MCV 96 01/07/2020 1127   NEUTROABS 9.4 (H) 01/18/2021 0512   NEUTROABS 7.6 (H) 01/07/2020 1127   LYMPHSABS 2.7 01/18/2021 0512   LYMPHSABS 2.0 01/07/2020 1127   MONOABS 0.8 01/18/2021 0512   EOSABS 0.8 (H) 01/18/2021 0512   EOSABS 0.4 01/07/2020 1127   BASOSABS 0.1 01/18/2021 0512   BASOSABS 0.1 01/07/2020 1127    Recent Results (from the past 240 hour(s))  Resp Panel by RT-PCR (Flu A&B, Covid) Nasopharyngeal Swab     Status: None   Collection Time: 01/18/21 10:30 PM   Specimen: Nasopharyngeal Swab; Nasopharyngeal(NP) swabs in vial transport medium  Result Value Ref Range Status  SARS Coronavirus 2 by RT PCR NEGATIVE NEGATIVE Final    Comment: (NOTE) SARS-CoV-2 target nucleic acids are NOT DETECTED.  The SARS-CoV-2 RNA is generally detectable in upper respiratory specimens during the acute phase of infection. The lowest concentration of SARS-CoV-2 viral copies this assay can detect is 138 copies/mL. A negative result does not preclude SARS-Cov-2 infection and should not be used as the sole basis for treatment or other patient management decisions. A negative result may occur with  improper specimen collection/handling, submission of specimen other than nasopharyngeal  swab, presence of viral mutation(s) within the areas targeted by this assay, and inadequate number of viral copies(<138 copies/mL). A negative result must be combined with clinical observations, patient history, and epidemiological information. The expected result is Negative.  Fact Sheet for Patients:  BloggerCourse.com  Fact Sheet for Healthcare Providers:  SeriousBroker.it  This test is no t yet approved or cleared by the [place] FDA and  has been authorized for detection and/or diagnosis of SARS-CoV-2 by FDA under an Emergency Use Authorization (EUA). This EUA will remain  in effect (meaning this test can be used) for the duration of the COVID-19 declaration under Section 564(b)(1) of the Act, 21 U.S.C.section 360bbb-3(b)(1), unless the authorization is terminated  or revoked sooner.       Influenza A by PCR NEGATIVE NEGATIVE Final   Influenza B by PCR NEGATIVE NEGATIVE Final    Comment: (NOTE) The Xpert Xpress SARS-CoV-2/FLU/RSV plus assay is intended as an aid in the diagnosis of influenza from Nasopharyngeal swab specimens and should not be used as a sole basis for treatment. Nasal washings and aspirates are unacceptable for Xpert Xpress SARS-CoV-2/FLU/RSV testing.  Fact Sheet for Patients: BloggerCourse.com  Fact Sheet for Healthcare Providers: SeriousBroker.it  This test is not yet approved or cleared by the [place] FDA and has been authorized for detection and/or diagnosis of SARS-CoV-2 by FDA under an Emergency Use Authorization (EUA). This EUA will remain in effect (meaning this test can be used) for the duration of the COVID-19 declaration under Section 564(b)(1) of the Act, 21 U.S.C. section 360bbb-3(b)(1), unless the authorization is terminated or revoked.  Performed at [facility], [address], [place], [place] [address]      Studies/Results: No results found.  Medications: Scheduled Meds:  enoxaparin (LOVENOX) injection  40 mg Subcutaneous Daily   gabapentin  400 mg Oral TID   HYDROmorphone   Intravenous Q4H   oxyCODONE  10 mg Oral Q12H   senna-docusate  1 tablet Oral BID   Continuous Infusions:  dextrose 5 % and 0.45% NaCl 10 mL/hr at 01/19/21 1334   diphenhydrAMINE     PRN Meds:.acetaminophen, diphenhydrAMINE **OR** diphenhydrAMINE, naloxone **AND** sodium chloride flush, ondansetron (ZOFRAN) IV, oxyCODONE, polyethylene glycol  Consultants: None  Procedures: None  Antibiotics: None  Assessment/Plan: Principal Problem:   Sickle cell anemia with crisis (HCC) Active Problems:   Opioid dependence in remission (HCC)   Current mild episode of major depressive disorder (HCC)   Anemia of chronic disease   Acute kidney injury (HCC)  Sickle cell disease with pain crisis: Decrease IV fluids to KVO Weaning IV Dilaudid PCA Initiate oxycodone 10 mg every 4 hours as needed for severe breakthrough pain Continue OxyContin 10 mg every 12 hours Toradol 15 mg IV every 6 hours Monitor vital signs very closely, reevaluate pain scale regularly, and supplemental oxygen as needed  Chronic pain syndrome: Continue home medications  Anemia of chronic disease: Hemoglobin is stable and consistent  with patient's baseline.  There is no clinical indication for blood transfusion at this time follow closely.  Acute kidney injury: Resolved.  Avoid all nephrotoxins.  Continue to monitor closely.  Code Status: Full Code Family Communication: N/A Disposition Plan: Not yet ready for discharge  Kahlin Mark Rennis Petty  APRN, MSN, FNP-C [facility] [address] [place], [place] [address] [phone]  If 5PM-8AM, please contact night-coverage.  01/19/2021, 5:04 PM  LOS: 2 days

## 2021-01-20 LAB — BASIC METABOLIC PANEL
Anion gap: 5 (ref 5–15)
BUN: 7 mg/dL (ref 6–20)
CO2: 25 mmol/L (ref 22–32)
Calcium: 8.6 mg/dL — ABNORMAL LOW (ref 8.9–10.3)
Chloride: 112 mmol/L — ABNORMAL HIGH (ref 98–111)
Creatinine, Ser: 0.63 mg/dL (ref 0.44–1.00)
GFR, Estimated: >60 mL/min (ref >60)
Glucose, Bld: 90 mg/dL (ref 70–99)
Potassium: 4.2 mmol/L (ref 3.5–5.1)
Sodium: 142 mmol/L (ref 135–145)

## 2021-01-20 LAB — CBC
HCT: 20.8 % — ABNORMAL LOW (ref 36.0–46.0)
Hemoglobin: 7.8 g/dL — ABNORMAL LOW (ref 12.0–15.0)
MCH: 32.2 pg (ref 26.0–34.0)
MCHC: 37.5 g/dL — ABNORMAL HIGH (ref 30.0–36.0)
MCV: 86 fL (ref 80.0–100.0)
Platelets: 299 10*3/uL (ref 150–400)
RBC: 2.42 MIL/uL — ABNORMAL LOW (ref 3.87–5.11)
RDW: 13.7 % (ref 11.5–15.5)
WBC: 11.2 10*3/uL — ABNORMAL HIGH (ref 4.0–10.5)
nRBC: 0.7 % — ABNORMAL HIGH (ref 0.0–0.2)

## 2021-01-20 MED ORDER — OXYCODONE HCL 10 MG PO TABS: 10.0000 mg | ORAL_TABLET | Freq: Four times a day (QID) | ORAL | 0 refills | Status: DC | PRN

## 2021-01-20 NOTE — Progress Notes (Signed)
Pt discharged home with all belongings. No questions or concerns at this time. Encouraged pt to f/u with PCP if any issues arise following discharge.

## 2021-01-20 NOTE — Discharge Summary (Signed)
Physician Discharge Summary  Lindsey [name] ZOX:[id number] DOB: [birth year] DOA: 01/17/2021  PCP: Massie Maroon, FNP  Admit date: 01/17/2021  Discharge date: 01/20/2021  Discharge Diagnoses:  Principal Problem:   Sickle cell anemia with crisis [facility]) Active Problems:   Opioid dependence in remission (HCC)   Current mild episode of major depressive disorder (HCC)   Anemia of chronic disease   Acute kidney injury [facility]   Discharge Condition: Stable  Disposition:   Follow-up Information     Massie Maroon, FNP Follow up in 1 week(s).   Specialty: Family Medicine Contact information: [address] [place] [address] [phone]                Pt is discharged home in good condition and is to follow up with Massie Maroon, FNP this week to have labs evaluated. Lindsey [name] is instructed to increase activity slowly and balance with rest for the next few days, and use prescribed medication to complete treatment of pain  Diet: Regular Wt Readings from Last 3 Encounters:  01/17/21 68 kg  12/23/20 65.6 kg  10/27/20 69.5 kg    History of present illness:  Lindsey [name] is a 50 year old female with a medical history significant for sickle cell disease, chronic pain syndrome, opiate dependence and tolerance, and history of anemia of chronic disease that presented to the ER complaining of pain primarily to back and legs for total of 4 days.  Patient is very well-known to our service and her pain is consistent with her typical pain crisis.  She is taken her home regimen without response.  Patient was seen in the ER and evaluated.  She denied any nausea, vomiting, or diarrhea.  No fever chills or sick contacts.  Patient was treated with up to 6 mg of IV Dilaudid without relief of her pain.  At this point, patient was being admitted to the hospital for further evaluation and treatment.  Patient denied any other complaints.  Patient was also given  ketamine in the ER and complain of heart palpitations and weakness following administration.  Will admit to inpatient services for further evaluation of sickle cell crisis.  Hospital Course:  Sickle cell pain crisis: Patient was admitted for sickle cell pain crisis and managed appropriately with IVF, IV Dilaudid via PCA and IV Toradol, as well as other adjunct therapies per sickle cell pain management protocols.  IV Dilaudid PCA was weaned appropriately.  Home medications resumed.  Patient will follow-up with PCP in 2 weeks for medication management.  Pain intensity is 4/10 and patient is requesting discharge home. She is alert, oriented, and ambulating without assistance.  Patient was therefore discharged home today in a hemodynamically stable condition.   Lindsey [name] will follow-up with PCP within 1 week of this discharge. Lindsey [name] was counseled extensively about nonpharmacologic means of pain management, patient verbalized understanding and was appreciative of  the care received during this admission.   We discussed the need for good hydration, monitoring of hydration status, avoidance of heat, cold, stress, and infection triggers. We discussed the need to be adherent with taking home medications. Patient was reminded of the need to seek medical attention immediately if any symptom of bleeding, anemia, or infection occurs.  Discharge Exam: Vitals:   01/20/21 1015 01/20/21 1027  BP: 125/83   Pulse: 74   Resp: 14 14  Temp: 99.6 F (37.6 C)   SpO2: 100% 100%   Vitals:   01/20/21 0321 01/20/21 0443  01/20/21 1015 01/20/21 1027  BP:  104/64 125/83   Pulse:  76 74   Resp: 11 14 14 14   Temp:  99 F (37.2 C) 99.6 F (37.6 C)   TempSrc:  Oral Oral   SpO2: 96% 96% 100% 100%  Weight:      Height:        General appearance : Awake, alert, not in any distress. Speech Clear. Not toxic looking HEENT: Atraumatic and Normocephalic, pupils equally reactive to light and accomodation Neck:  Supple, no JVD. No cervical lymphadenopathy.  Chest: Good air entry bilaterally, no added sounds  CVS: S1 S2 regular, no murmurs.  Abdomen: Bowel sounds present, Non tender and not distended with no gaurding, rigidity or rebound. Extremities: B/L Lower Ext shows no edema, both legs are warm to touch Neurology: Awake alert, and oriented X 3, CN II-XII intact, Non focal Skin: No Rash  Discharge Instructions  Discharge Instructions     Discharge patient   Complete by: As directed    Discharge disposition: 01-Home or Self Care   Discharge patient date: 01/20/2021      Allergies as of 01/20/2021       Reactions   Ketamine Anxiety   Anxiety, elevated HR        Medication List     TAKE these medications    albuterol 108 (90 Base) MCG/ACT inhaler Commonly known as: VENTOLIN HFA INHALE 2 PUFFS INTO THE LUNGS EVERY 6 HOURS AS NEEDED FOR WHEEZING OR SHORTNESS OF BREATH   ergocalciferol 1.25 MG (50000 UT) capsule Commonly known as: VITAMIN D2 Take 1 capsule (50,000 Units total) by mouth once a week.   gabapentin 400 MG capsule Commonly known as: NEURONTIN Take 1 capsule (400 mg total) by mouth 3 (three) times daily.   ibuprofen 800 MG tablet Commonly known as: ADVIL Take 1 tablet (800 mg total) by mouth every 8 (eight) hours as needed. TAKE 1 TABLET(800 MG) BY MOUTH EVERY 8 HOURS AS NEEDED   oxyCODONE 10 mg 12 hr tablet Commonly known as: OXYCONTIN Take 1 tablet (10 mg total) by mouth every 12 (twelve) hours.   Oxycodone HCl 10 MG Tabs Take 1 tablet (10 mg total) by mouth every 6 (six) hours as needed (pain).        The results of significant diagnostics from this hospitalization (including imaging, microbiology, ancillary and laboratory) are listed below for reference.    Significant Diagnostic Studies: DG Chest Port 1 View  Result Date: 01/17/2021 CLINICAL DATA:  50 year old female with sickle cell pain crisis. EXAM: PORTABLE CHEST 1 VIEW COMPARISON:  Chest  radiographs 08/25/2020 and earlier. FINDINGS: Portable AP semi upright view at 0754 hours. Stable borderline cardiomegaly. Other mediastinal contours are within normal limits. Visualized tracheal air column is within normal limits. Patchy asymmetric opacity at the right lung base was present in April and appears only mildly increased. Elsewhere Allowing for portable technique the lungs are clear. No pneumothorax or pleural effusion. Stable visualized osseous structures. H shaped vertebra. Chronic humeral head sclerosis. Negative visible bowel gas pattern. IMPRESSION: 1. Patchy right lung base opacity appears only mildly increased since April, favor chronic scarring over acute chest syndrome. 2. No other acute cardiopulmonary abnormality. Electronically Signed   By: May M.D.   On: 01/17/2021 08:04    Microbiology: Recent Results (from the past 240 hour(s))  Resp Panel by RT-PCR (Flu A&B, Covid) Nasopharyngeal Swab     Status: None   Collection Time: 01/18/21 10:30 PM  Specimen: Nasopharyngeal Swab; Nasopharyngeal(NP) swabs in vial transport medium  Result Value Ref Range Status   SARS Coronavirus 2 by RT PCR NEGATIVE NEGATIVE Final    Comment: (NOTE) SARS-CoV-2 target nucleic acids are NOT DETECTED.  The SARS-CoV-2 RNA is generally detectable in upper respiratory specimens during the acute phase of infection. The lowest concentration of SARS-CoV-2 viral copies this assay can detect is 138 copies/mL. A negative result does not preclude SARS-Cov-2 infection and should not be used as the sole basis for treatment or other patient management decisions. A negative result may occur with  improper specimen collection/handling, submission of specimen other than nasopharyngeal swab, presence of viral mutation(s) within the areas targeted by this assay, and inadequate number of viral copies(<138 copies/mL). A negative result must be combined with clinical observations, patient history, and  epidemiological information. The expected result is Negative.  Fact Sheet for Patients:  BloggerCourse.com  Fact Sheet for Healthcare Providers:  SeriousBroker.it  This test is no t yet approved or cleared by the [place] FDA and  has been authorized for detection and/or diagnosis of SARS-CoV-2 by FDA under an Emergency Use Authorization (EUA). This EUA will remain  in effect (meaning this test can be used) for the duration of the COVID-19 declaration under Section 564(b)(1) of the Act, 21 U.S.C.section 360bbb-3(b)(1), unless the authorization is terminated  or revoked sooner.       Influenza A by PCR NEGATIVE NEGATIVE Final   Influenza B by PCR NEGATIVE NEGATIVE Final    Comment: (NOTE) The Xpert Xpress SARS-CoV-2/FLU/RSV plus assay is intended as an aid in the diagnosis of influenza from Nasopharyngeal swab specimens and should not be used as a sole basis for treatment. Nasal washings and aspirates are unacceptable for Xpert Xpress SARS-CoV-2/FLU/RSV testing.  Fact Sheet for Patients: BloggerCourse.com  Fact Sheet for Healthcare Providers: SeriousBroker.it  This test is not yet approved or cleared by the [place] FDA and has been authorized for detection and/or diagnosis of SARS-CoV-2 by FDA under an Emergency Use Authorization (EUA). This EUA will remain in effect (meaning this test can be used) for the duration of the COVID-19 declaration under Section 564(b)(1) of the Act, 21 U.S.C. section 360bbb-3(b)(1), unless the authorization is terminated or revoked.  Performed at [facility], [address] [place], [place] [address]      Labs: Basic Metabolic Panel: Recent Labs  Lab 01/17/21 0747 01/18/21 0512 01/20/21 0449  NA 142 139 142  K 4.3 4.3 4.2  CL 110 105 112*  CO2 26 26 25   GLUCOSE 100* 107* 90  BUN 13 18 7   CREATININE  0.88 1.34* 0.63  CALCIUM 9.5 8.5* 8.6*   Liver Function Tests: Recent Labs  Lab 01/18/21 0512  AST 31  ALT 20  ALKPHOS 37*  BILITOT 1.4*  PROT 7.2  ALBUMIN 4.4   No results for input(s): LIPASE, AMYLASE in the last 168 hours. No results for input(s): AMMONIA in the last 168 hours. CBC: Recent Labs  Lab 01/17/21 0747 01/18/21 0512 01/20/21 0449  WBC 10.5 13.8* 11.2*  NEUTROABS 5.9 9.4*  --   HGB 10.6* 9.1* 7.8*  HCT 28.1* 24.1* 20.8*  MCV 85.7 85.5 86.0  PLT 418* 308 299   Cardiac Enzymes: No results for input(s): CKTOTAL, CKMB, CKMBINDEX, TROPONINI in the last 168 hours. BNP: Invalid input(s): POCBNP CBG: No results for input(s): GLUCAP in the last 168 hours.  Time coordinating discharge: 50 minutes  Signed:  03/20/21  APRN,  MSN, FNP-C [facility] [address] [place], [place] [address] [phone]  [facility] 01/20/2021, 12:26 PM

## 2021-01-21 ENCOUNTER — Telehealth: Payer: Self-pay | Admitting: *Deleted

## 2021-01-21 NOTE — Telephone Encounter (Signed)
Transition Care Management Unsuccessful Follow-up Telephone Call  Date of discharge and from where:  01/20/2021  [facility]  Attempts:  1st Attempt  Reason for unsuccessful TCM follow-up call:  No answer/busy  Angela Vazguez RNC, BSN RN Case Manager [phone]    

## 2021-01-22 ENCOUNTER — Telehealth: Payer: Self-pay

## 2021-01-22 NOTE — Telephone Encounter (Signed)
Transition Care Management Unsuccessful Follow-up Telephone Call  Date of discharge and from where:  01/20/21 from [facility]  Attempts:  2nd Attempt  Reason for unsuccessful TCM follow-up call:  Left voice message    Kathyrn Sheriff, RN, MSN, BSN, CCM [facility] Care Management Coordinator [phone]

## 2021-02-01 ENCOUNTER — Telehealth: Payer: Self-pay

## 2021-02-01 ENCOUNTER — Other Ambulatory Visit: Payer: Self-pay | Admitting: Family Medicine

## 2021-02-01 DIAGNOSIS — G894 Chronic pain syndrome: Secondary | ICD-10-CM

## 2021-02-01 MED ORDER — OXYCODONE HCL ER 10 MG PO T12A
10.0000 mg | EXTENDED_RELEASE_TABLET | Freq: Two times a day (BID) | ORAL | 0 refills | Status: DC
Start: 2021-02-01 — End: 2021-03-01

## 2021-02-01 MED ORDER — OXYCODONE HCL 10 MG PO TABS: 10.0000 mg | ORAL_TABLET | Freq: Four times a day (QID) | ORAL | 0 refills | Status: DC | PRN

## 2021-02-01 NOTE — Telephone Encounter (Signed)
Oxycodone 10 mg Oxy contin

## 2021-02-01 NOTE — Progress Notes (Signed)
Reviewed PDMP substance reporting system prior to prescribing opiate medications. No inconsistencies noted.   Meds ordered this encounter  Medications  . Oxycodone HCl 10 MG TABS    Sig: Take 1 tablet (10 mg total) by mouth every 6 (six) hours as needed (pain).    Dispense:  60 tablet    Refill:  0    Order Specific Question:   Supervising Provider    Answer:   [name], [name]  . oxyCODONE (OXYCONTIN) 10 mg 12 hr tablet    Sig: Take 1 tablet (10 mg total) by mouth every 12 (twelve) hours.    Dispense:  60 tablet    Refill:  0    Order Specific Question:   Supervising Provider    Answer:   [name], [name]     Lindsey [name] Lindsey Walck  APRN, MSN, FNP-C [facility] [address]  [place], [place] [address] [phone]  

## 2021-02-15 ENCOUNTER — Telehealth: Payer: Self-pay

## 2021-02-15 ENCOUNTER — Other Ambulatory Visit: Payer: Self-pay

## 2021-02-15 ENCOUNTER — Other Ambulatory Visit: Payer: Self-pay | Admitting: Family Medicine

## 2021-02-15 DIAGNOSIS — G894 Chronic pain syndrome: Secondary | ICD-10-CM

## 2021-02-15 MED ORDER — OXYCODONE HCL 10 MG PO TABS: 10.0000 mg | ORAL_TABLET | Freq: Four times a day (QID) | ORAL | 0 refills | Status: DC | PRN

## 2021-02-15 NOTE — Telephone Encounter (Signed)
Oxycodone 10mg

## 2021-02-15 NOTE — Progress Notes (Signed)
Reviewed PDMP substance reporting system prior to prescribing opiate medications. No inconsistencies noted.  Meds ordered this encounter  Medications   Oxycodone HCl 10 MG TABS    Sig: Take 1 tablet (10 mg total) by mouth every 6 (six) hours as needed (pain).    Dispense:  60 tablet    Refill:  0    Order Specific Question:   Supervising Provider    Answer:   [name], [name]   Lindsey Glastetter Moore Vernor Monnig  APRN, MSN, FNP-C [facility] [address]  [place], [place] [address] [phone]  

## 2021-03-01 ENCOUNTER — Other Ambulatory Visit: Payer: Self-pay | Admitting: Family Medicine

## 2021-03-01 ENCOUNTER — Telehealth: Payer: Self-pay

## 2021-03-01 DIAGNOSIS — G894 Chronic pain syndrome: Secondary | ICD-10-CM

## 2021-03-01 MED ORDER — OXYCODONE HCL ER 10 MG PO T12A: 10.0000 mg | EXTENDED_RELEASE_TABLET | Freq: Two times a day (BID) | ORAL | 0 refills | Status: DC

## 2021-03-01 MED ORDER — OXYCODONE HCL 10 MG PO TABS: 10.0000 mg | ORAL_TABLET | Freq: Four times a day (QID) | ORAL | 0 refills | Status: DC | PRN

## 2021-03-01 NOTE — Progress Notes (Signed)
Reviewed PDMP substance reporting system prior to prescribing opiate medications. No inconsistencies noted.  Meds ordered this encounter  Medications   oxyCODONE (OXYCONTIN) 10 mg 12 hr tablet    Sig: Take 1 tablet (10 mg total) by mouth every 12 (twelve) hours.    Dispense:  60 tablet    Refill:  0    Order Specific Question:   Supervising Provider    Answer:   [name], [name]      Lindsey [name] Cyriah Childrey  APRN, MSN, FNP-C [facility] [address]  [place], [place] [address] [phone]  

## 2021-03-01 NOTE — Progress Notes (Signed)
Reviewed PDMP substance reporting system prior to prescribing opiate medications. No inconsistencies noted.  Meds ordered this encounter  Medications   Oxycodone HCl 10 MG TABS    Sig: Take 1 tablet (10 mg total) by mouth every 6 (six) hours as needed (pain).    Dispense:  60 tablet    Refill:  0    Order Specific Question:   Supervising Provider    Answer:   [name], [name]   Raphel Stickles Moore Illana Nolting  APRN, MSN, FNP-C [facility] [address]  [place], [place] [address] [phone]  

## 2021-03-02 ENCOUNTER — Other Ambulatory Visit: Payer: Self-pay | Admitting: Family Medicine

## 2021-03-02 NOTE — Telephone Encounter (Signed)
Oxycodone  °

## 2021-03-05 ENCOUNTER — Emergency Department (HOSPITAL_COMMUNITY): Payer: Medicaid Other

## 2021-03-05 ENCOUNTER — Encounter (HOSPITAL_COMMUNITY): Payer: Self-pay

## 2021-03-05 ENCOUNTER — Inpatient Hospital Stay (HOSPITAL_COMMUNITY)
Admission: EM | Admit: 2021-03-05 | Discharge: 2021-03-13 | DRG: 812 | Disposition: A | Discharge status: Home / Self Care | Payer: Medicaid Other | Attending: Internal Medicine | Admitting: Internal Medicine

## 2021-03-05 ENCOUNTER — Other Ambulatory Visit: Payer: Self-pay

## 2021-03-05 DIAGNOSIS — R000 Tachycardia, unspecified: Secondary | ICD-10-CM | POA: Diagnosis present

## 2021-03-05 DIAGNOSIS — Z79899 Other long term (current) drug therapy: Secondary | ICD-10-CM

## 2021-03-05 DIAGNOSIS — D571 Sickle-cell disease without crisis: Secondary | ICD-10-CM | POA: Diagnosis present

## 2021-03-05 DIAGNOSIS — R5383 Other fatigue: Secondary | ICD-10-CM | POA: Diagnosis present

## 2021-03-05 DIAGNOSIS — R0602 Shortness of breath: Secondary | ICD-10-CM

## 2021-03-05 DIAGNOSIS — G894 Chronic pain syndrome: Secondary | ICD-10-CM | POA: Diagnosis present

## 2021-03-05 DIAGNOSIS — F17290 Nicotine dependence, other tobacco product, uncomplicated: Secondary | ICD-10-CM | POA: Diagnosis present

## 2021-03-05 DIAGNOSIS — F17210 Nicotine dependence, cigarettes, uncomplicated: Secondary | ICD-10-CM | POA: Diagnosis present

## 2021-03-05 DIAGNOSIS — Z888 Allergy status to other drugs, medicaments and biological substances status: Secondary | ICD-10-CM

## 2021-03-05 DIAGNOSIS — F1121 Opioid dependence, in remission: Secondary | ICD-10-CM | POA: Diagnosis present

## 2021-03-05 DIAGNOSIS — D638 Anemia in other chronic diseases classified elsewhere: Secondary | ICD-10-CM | POA: Diagnosis present

## 2021-03-05 DIAGNOSIS — R5081 Fever presenting with conditions classified elsewhere: Secondary | ICD-10-CM | POA: Diagnosis present

## 2021-03-05 DIAGNOSIS — D72829 Elevated white blood cell count, unspecified: Secondary | ICD-10-CM | POA: Diagnosis present

## 2021-03-05 DIAGNOSIS — Z20822 Contact with and (suspected) exposure to covid-19: Secondary | ICD-10-CM | POA: Diagnosis present

## 2021-03-05 DIAGNOSIS — R0902 Hypoxemia: Secondary | ICD-10-CM

## 2021-03-05 DIAGNOSIS — D5700 Hb-SS disease with crisis, unspecified: Principal | ICD-10-CM | POA: Diagnosis present

## 2021-03-05 DIAGNOSIS — R7402 Elevation of levels of lactic acid dehydrogenase (LDH): Secondary | ICD-10-CM | POA: Diagnosis present

## 2021-03-05 DIAGNOSIS — A4902 Methicillin resistant Staphylococcus aureus infection, unspecified site: Secondary | ICD-10-CM | POA: Diagnosis present

## 2021-03-05 LAB — CBC WITH DIFFERENTIAL/PLATELET
Abs Immature Granulocytes: 0.38 10*3/uL — ABNORMAL HIGH (ref 0.00–0.07)
Basophils Absolute: 0.1 10*3/uL (ref 0.0–0.1)
Basophils Relative: 1 %
Eosinophils Absolute: 0.4 10*3/uL (ref 0.0–0.5)
Eosinophils Relative: 3 %
HCT: 28.3 % — ABNORMAL LOW (ref 36.0–46.0)
Hemoglobin: 10.5 g/dL — ABNORMAL LOW (ref 12.0–15.0)
Immature Granulocytes: 3 %
Lymphocytes Relative: 27 %
Lymphs Abs: 3.3 10*3/uL (ref 0.7–4.0)
MCH: 32.1 pg (ref 26.0–34.0)
MCHC: 37.1 g/dL — ABNORMAL HIGH (ref 30.0–36.0)
MCV: 86.5 fL (ref 80.0–100.0)
Monocytes Absolute: 0.6 10*3/uL (ref 0.1–1.0)
Monocytes Relative: 5 %
Neutro Abs: 7.7 10*3/uL (ref 1.7–7.7)
Neutrophils Relative %: 61 %
Platelets: 416 10*3/uL — ABNORMAL HIGH (ref 150–400)
RBC: 3.27 MIL/uL — ABNORMAL LOW (ref 3.87–5.11)
RDW: 14 % (ref 11.5–15.5)
WBC: 12.5 10*3/uL — ABNORMAL HIGH (ref 4.0–10.5)
nRBC: 0.4 % — ABNORMAL HIGH (ref 0.0–0.2)

## 2021-03-05 LAB — TROPONIN I (HIGH SENSITIVITY): Troponin I (High Sensitivity): 5 ng/L (ref <18)

## 2021-03-05 LAB — RESP PANEL BY RT-PCR (FLU A&B, COVID) ARPGX2
Influenza A by PCR: NEGATIVE
Influenza B by PCR: NEGATIVE
SARS Coronavirus 2 by RT PCR: NEGATIVE

## 2021-03-05 LAB — I-STAT BETA HCG BLOOD, ED (MC, WL, AP ONLY): I-stat hCG, quantitative: <5 m[IU]/mL (ref <5)

## 2021-03-05 LAB — COMPREHENSIVE METABOLIC PANEL
ALT: 19 U/L (ref 0–44)
AST: 27 U/L (ref 15–41)
Albumin: 4.7 g/dL (ref 3.5–5.0)
Alkaline Phosphatase: 45 U/L (ref 38–126)
Anion gap: 8 (ref 5–15)
BUN: 10 mg/dL (ref 6–20)
CO2: 24 mmol/L (ref 22–32)
Calcium: 9.3 mg/dL (ref 8.9–10.3)
Chloride: 109 mmol/L (ref 98–111)
Creatinine, Ser: 0.81 mg/dL (ref 0.44–1.00)
GFR, Estimated: >60 mL/min (ref >60)
Glucose, Bld: 88 mg/dL (ref 70–99)
Potassium: 4.4 mmol/L (ref 3.5–5.1)
Sodium: 141 mmol/L (ref 135–145)
Total Bilirubin: 1.7 mg/dL — ABNORMAL HIGH (ref 0.3–1.2)
Total Protein: 7.9 g/dL (ref 6.5–8.1)

## 2021-03-05 LAB — RETICULOCYTES
Immature Retic Fract: 25.6 % — ABNORMAL HIGH (ref 2.3–15.9)
RBC.: 3.24 MIL/uL — ABNORMAL LOW (ref 3.87–5.11)
Retic Count, Absolute: 121.8 10*3/uL (ref 19.0–186.0)
Retic Ct Pct: 3.8 % — ABNORMAL HIGH (ref 0.4–3.1)

## 2021-03-05 MED ORDER — ONDANSETRON HCL 4 MG/2ML IJ SOLN
4.0000 mg | INTRAMUSCULAR | Status: DC | PRN
  Administered 2021-03-09 (×2): 4 mg via INTRAVENOUS
  Filled 2021-03-05 (×2): qty 2

## 2021-03-05 MED ORDER — HYDROMORPHONE 1 MG/ML IV SOLN: INTRAVENOUS | Status: DC

## 2021-03-05 MED ORDER — IOHEXOL 350 MG/ML SOLN
80.0000 mL | Freq: Once | INTRAVENOUS | Status: AC | PRN
  Administered 2021-03-05: 80 mL via INTRAVENOUS

## 2021-03-05 MED ORDER — ONDANSETRON HCL 4 MG/2ML IJ SOLN
4.0000 mg | INTRAMUSCULAR | Status: DC | PRN
  Administered 2021-03-05: 4 mg via INTRAVENOUS
  Filled 2021-03-05 (×2): qty 2

## 2021-03-05 MED ORDER — DIPHENHYDRAMINE HCL 25 MG PO CAPS
25.0000 mg | ORAL_CAPSULE | ORAL | Status: DC | PRN
  Administered 2021-03-07: 25 mg via ORAL
  Filled 2021-03-05: qty 1

## 2021-03-05 MED ORDER — INFLUENZA VAC SPLIT QUAD 0.5 ML IM SUSY
0.5000 mL | PREFILLED_SYRINGE | INTRAMUSCULAR | Status: DC
  Filled 2021-03-05: qty 0.5

## 2021-03-05 MED ORDER — AZITHROMYCIN 500 MG IV SOLR
500.0000 mg | Freq: Once | INTRAVENOUS | Status: AC
  Administered 2021-03-05: 500 mg via INTRAVENOUS
  Filled 2021-03-05: qty 500

## 2021-03-05 MED ORDER — DEXTROSE-NACL 5-0.45 % IV SOLN: INTRAVENOUS | Status: DC

## 2021-03-05 MED ORDER — ALBUTEROL SULFATE HFA 108 (90 BASE) MCG/ACT IN AERS
1.0000 | INHALATION_SPRAY | RESPIRATORY_TRACT | Status: DC | PRN
  Filled 2021-03-05: qty 6.7

## 2021-03-05 MED ORDER — HYDROMORPHONE HCL 2 MG/ML IJ SOLN
2.0000 mg | Freq: Once | INTRAMUSCULAR | Status: AC
  Administered 2021-03-05: 2 mg via INTRAVENOUS
  Filled 2021-03-05: qty 1

## 2021-03-05 MED ORDER — SODIUM CHLORIDE 0.9% FLUSH: 9.0000 mL | INTRAVENOUS | Status: DC | PRN

## 2021-03-05 MED ORDER — DIPHENHYDRAMINE HCL 50 MG/ML IJ SOLN
25.0000 mg | Freq: Once | INTRAMUSCULAR | Status: AC
  Administered 2021-03-05: 25 mg via INTRAVENOUS
  Filled 2021-03-05: qty 1

## 2021-03-05 MED ORDER — SODIUM CHLORIDE 0.9 % IV SOLN
25.0000 mg | INTRAVENOUS | Status: DC | PRN
  Filled 2021-03-05: qty 0.5

## 2021-03-05 MED ORDER — AZITHROMYCIN 250 MG PO TABS
250.0000 mg | ORAL_TABLET | Freq: Every day | ORAL | Status: DC
  Administered 2021-03-06 – 2021-03-07 (×2): 250 mg via ORAL
  Filled 2021-03-05 (×2): qty 1

## 2021-03-05 MED ORDER — SENNOSIDES-DOCUSATE SODIUM 8.6-50 MG PO TABS
1.0000 | ORAL_TABLET | Freq: Two times a day (BID) | ORAL | Status: DC
  Administered 2021-03-05 – 2021-03-13 (×15): 1 via ORAL
  Filled 2021-03-05 (×16): qty 1

## 2021-03-05 MED ORDER — HYDROMORPHONE HCL 2 MG/ML IJ SOLN
2.0000 mg | INTRAMUSCULAR | Status: DC | PRN
Start: 2021-03-05 — End: 2021-03-06
  Administered 2021-03-06 (×4): 2 mg via INTRAVENOUS
  Filled 2021-03-05 (×4): qty 1

## 2021-03-05 MED ORDER — HYDROMORPHONE HCL 2 MG/ML IJ SOLN
2.0000 mg | INTRAMUSCULAR | Status: AC
  Administered 2021-03-05: 2 mg via INTRAVENOUS
  Filled 2021-03-05: qty 1

## 2021-03-05 MED ORDER — HYDROMORPHONE HCL 2 MG/ML IJ SOLN
2.0000 mg | INTRAMUSCULAR | Status: AC | PRN
  Administered 2021-03-05: 2 mg via INTRAVENOUS
  Filled 2021-03-05: qty 1

## 2021-03-05 MED ORDER — HYDROXYZINE HCL 25 MG PO TABS
25.0000 mg | ORAL_TABLET | Freq: Every evening | ORAL | Status: DC | PRN
  Administered 2021-03-06 – 2021-03-07 (×3): 25 mg via ORAL
  Filled 2021-03-05 (×3): qty 1

## 2021-03-05 MED ORDER — OXYCODONE HCL ER 10 MG PO T12A
10.0000 mg | EXTENDED_RELEASE_TABLET | Freq: Two times a day (BID) | ORAL | Status: DC
  Administered 2021-03-05 – 2021-03-13 (×16): 10 mg via ORAL
  Filled 2021-03-05 (×16): qty 1

## 2021-03-05 MED ORDER — HYDROMORPHONE HCL 1 MG/ML IJ SOLN: 1.0000 mg | INTRAMUSCULAR | Status: DC | PRN

## 2021-03-05 MED ORDER — GABAPENTIN 400 MG PO CAPS
400.0000 mg | ORAL_CAPSULE | Freq: Three times a day (TID) | ORAL | Status: DC
  Administered 2021-03-06 – 2021-03-13 (×22): 400 mg via ORAL
  Filled 2021-03-05 (×23): qty 1

## 2021-03-05 MED ORDER — HYDROMORPHONE 1 MG/ML IV SOLN
INTRAVENOUS | Status: DC
  Administered 2021-03-06: 4 mg via INTRAVENOUS
  Administered 2021-03-06: 0.5 mg via INTRAVENOUS
  Administered 2021-03-06: 9 mg via INTRAVENOUS
  Administered 2021-03-07: 1.5 mg via INTRAVENOUS
  Administered 2021-03-07: 2 mg via INTRAVENOUS
  Administered 2021-03-07: 2.4 mg via INTRAVENOUS
  Administered 2021-03-07: 3.5 mg via INTRAVENOUS
  Administered 2021-03-07: 60 mg via INTRAVENOUS
  Administered 2021-03-08: 2 mg via INTRAVENOUS
  Administered 2021-03-08: 1.5 mg via INTRAVENOUS
  Administered 2021-03-08: 2 mg via INTRAVENOUS
  Administered 2021-03-08: 0.5 mg via INTRAVENOUS
  Administered 2021-03-08: 2 mg via INTRAVENOUS
  Administered 2021-03-08: 1.5 mg via INTRAVENOUS
  Administered 2021-03-09: 0 mg via INTRAVENOUS
  Administered 2021-03-10: 4 mg via INTRAVENOUS
  Administered 2021-03-10: 1.2 mg via INTRAVENOUS
  Administered 2021-03-10: 30 mg via INTRAVENOUS
  Administered 2021-03-11: 1 mg via INTRAVENOUS
  Administered 2021-03-11: 3 mg via INTRAVENOUS
  Administered 2021-03-11: 3.79 mg via INTRAVENOUS
  Filled 2021-03-05 (×3): qty 30

## 2021-03-05 MED ORDER — ALUM & MAG HYDROXIDE-SIMETH 200-200-20 MG/5ML PO SUSP
15.0000 mL | ORAL | Status: DC | PRN
  Administered 2021-03-10: 30 mL via ORAL
  Filled 2021-03-05: qty 30

## 2021-03-05 MED ORDER — SODIUM CHLORIDE 0.9 % IV SOLN
1.0000 g | Freq: Once | INTRAVENOUS | Status: AC
  Administered 2021-03-05: 1 g via INTRAVENOUS
  Filled 2021-03-05: qty 10

## 2021-03-05 MED ORDER — NALOXONE HCL 0.4 MG/ML IJ SOLN: 0.4000 mg | INTRAMUSCULAR | Status: DC | PRN

## 2021-03-05 MED ORDER — ONDANSETRON HCL 4 MG PO TABS
4.0000 mg | ORAL_TABLET | ORAL | Status: DC | PRN
  Filled 2021-03-05: qty 1

## 2021-03-05 MED ORDER — ENOXAPARIN SODIUM 40 MG/0.4ML IJ SOSY
40.0000 mg | PREFILLED_SYRINGE | INTRAMUSCULAR | Status: DC
  Administered 2021-03-06 – 2021-03-10 (×3): 40 mg via SUBCUTANEOUS
  Filled 2021-03-05 (×4): qty 0.4

## 2021-03-05 MED ORDER — POLYETHYLENE GLYCOL 3350 17 G PO PACK
17.0000 g | PACK | Freq: Every day | ORAL | Status: DC | PRN
  Administered 2021-03-10: 17 g via ORAL
  Filled 2021-03-05: qty 1

## 2021-03-05 MED ORDER — SODIUM CHLORIDE 0.45 % IV SOLN
INTRAVENOUS | Status: DC
Start: 2021-03-05 — End: 2021-03-05

## 2021-03-05 MED ORDER — KETOROLAC TROMETHAMINE 15 MG/ML IJ SOLN
15.0000 mg | INTRAMUSCULAR | Status: AC
  Administered 2021-03-05: 15 mg via INTRAVENOUS
  Filled 2021-03-05: qty 1

## 2021-03-05 MED ORDER — KETOROLAC TROMETHAMINE 15 MG/ML IJ SOLN
15.0000 mg | Freq: Four times a day (QID) | INTRAMUSCULAR | Status: DC
  Administered 2021-03-05 – 2021-03-10 (×19): 15 mg via INTRAVENOUS
  Filled 2021-03-05 (×19): qty 1

## 2021-03-05 MED ORDER — MORPHINE SULFATE (PF) 4 MG/ML IV SOLN
8.0000 mg | Freq: Once | INTRAVENOUS | Status: AC
  Administered 2021-03-05: 8 mg via INTRAVENOUS
  Filled 2021-03-05: qty 2

## 2021-03-05 NOTE — ED Notes (Signed)
Pts significant other walked by triage nurses station on his way out to the lobby and held up his middle finger to staff sitting at this desk. Charge RN made aware.

## 2021-03-05 NOTE — H&P (Signed)
History and Physical    Lindsey [name] WUJ:[id number] DOB: 1970-12-16 DOA: 03/05/2021  PCP: Massie Maroon, FNP  Patient coming from: Home  I have personally briefly reviewed patient's old medical records in [facility]  Chief Complaint: Sickle cell pain  HPI: Lindsey [name] is a 50 y.o. 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  Patient reports 2-3 days of low back pain which feels similar to her previous sickle cell pain however more intense than previous episodes.  Pain radiates up her back.  She has not had any relief from her home OxyContin or oxycodone IR.  She has some chest discomfort and shortness of breath but no cough, fevers, chills, diaphoresis.  She is not having abdominal pain.  She reports last formed bowel movement earlier today.  She has not had any dysuria or incontinence.  She has not had any new weakness in her upper or lower extremities.  ED Course:  Initial vitals showed BP 146/103, pulse 114, RR 22, temp 98.3 F, SPO2 98% on room air.  Labs show WBC 12.5, hemoglobin 10.5, platelets 416,000, sodium 141, potassium 4.4, bicarb 24, BUN 10, creatinine 0.81, serum glucose 88, LFTs within normal limits, reticulocyte count 121.8, high-sensitivity troponin 5, i-STAT beta-hCG <5.0.  SARS-CoV-2 PCR panel was ordered and pending collection.  2 view chest x-ray negative for focal consolidation, edema, or effusion.  CTA chest PE study is negative for PE.  Patchy mosaic pattern of ill-defined groundglass opacity in both lungs are seen.  Bony findings of sickle cell disease noted.  Patient received IV Dilaudid 2 mg x 2, IV Toradol 15 mg x 1, IV morphine 8 mg x 1, IV Benadryl, IV ceftriaxone and azithromycin, and started on 0.45% NS infusion.  Due to uncontrolled pain, the hospitalist service was  consulted to admit for further evaluation and management.  Review of Systems: All systems reviewed and are negative except as documented in history of present illness above.   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   Opioid dependence in remission (HCC)    Sickle cell crisis [facility])     Past Surgical History:  Procedure Laterality Date   APPENDECTOMY     CESAREAN SECTION     OTHER SURGICAL HISTORY     c-section    Social History:  reports that she has been smoking cigars and cigarettes. She has never used smokeless tobacco. She reports current alcohol use. She reports that she does not use drugs.  Allergies  Allergen Reactions   Ketamine Anxiety    Anxiety, elevated HR    Family History  Problem Relation Age of Onset   Stroke Neg Hx        none that she knows of    Seizures Neg Hx      Prior to Admission medications   Medication Sig Start Date End Date Taking? Authorizing Provider  albuterol (VENTOLIN HFA) 108 (90 Base) MCG/ACT inhaler INHALE 2 PUFFS INTO THE LUNGS EVERY 6 HOURS AS NEEDED FOR WHEEZING OR SHORTNESS OF BREATH Patient not taking: No sig reported 06/01/20   Massie Maroon, FNP  ergocalciferol (VITAMIN D2) 1.25 MG (50000 UT) capsule Take 1 capsule (50,000 Units total) by mouth once a week. Patient not taking: No sig reported 10/16/19   Massie Maroon, FNP  gabapentin (  NEURONTIN) 400 MG capsule Take 1 capsule (400 mg total) by mouth 3 (three) times daily. 12/01/20   Massie Maroon, FNP  ibuprofen (ADVIL) 800 MG tablet Take 1 tablet (800 mg total) by mouth every 8 (eight) hours as needed. TAKE 1 TABLET(800 MG) BY MOUTH EVERY 8 HOURS AS NEEDED 11/30/20   Massie Maroon, FNP  oxyCODONE (OXYCONTIN) 10 mg 12 hr tablet Take 1 tablet (10 mg total) by mouth every 12 (twelve) hours. 03/03/21   Massie Maroon, FNP  Oxycodone HCl 10 MG TABS Take 1 tablet (10 mg total) by mouth every 6 (six) hours as needed (pain). 03/03/21    Massie Maroon, FNP    Physical Exam: Vitals:   03/05/21 1423 03/05/21 1800 03/05/21 1815 03/05/21 1845  BP:  (!) 129/91    Pulse:  80 74 80  Resp:  15 18 12   Temp:      TempSrc:      SpO2:  98% 98%   Weight: 61.2 kg     Height: 5' (1.524 m)      Constitutional: Lying in bed, tearful Eyes: PERRL, lids and conjunctivae normal ENMT: Mucous membranes are moist. Posterior pharynx clear of any exudate or lesions.Normal dentition.  Neck: normal, supple, no masses. Respiratory: clear to auscultation bilaterally, no wheezing, no crackles. Normal respiratory effort. No accessory muscle use.  Cardiovascular: Regular rate and rhythm, no murmurs / rubs / gallops. No extremity edema. 2+ pedal pulses. Abdomen: no tenderness, no masses palpated. No hepatosplenomegaly. Bowel sounds positive.  Musculoskeletal: no clubbing / cyanosis. No joint deformity upper and lower extremities. Good ROM, no contractures. Normal muscle tone.  No tenderness to spinous processes.  Has some paraspinal muscle tenderness. Skin: no rashes, lesions, ulcers. No induration Neurologic: CN 2-12 grossly intact. Sensation intact. Strength 5/5 in all 4.  Psychiatric: Alert and oriented x 3.  Labs on Admission: I have personally reviewed following labs and imaging studies  CBC: Recent Labs  Lab 03/05/21 1428  WBC 12.5*  NEUTROABS 7.7  HGB 10.5*  HCT 28.3*  MCV 86.5  PLT 416*   Basic Metabolic Panel: Recent Labs  Lab 03/05/21 1428  NA 141  K 4.4  CL 109  CO2 24  GLUCOSE 88  BUN 10  CREATININE 0.81  CALCIUM 9.3   GFR: Estimated Creatinine Clearance: 68.7 mL/min (by C-G formula based on SCr of 0.81 mg/dL). Liver Function Tests: Recent Labs  Lab 03/05/21 1428  AST 27  ALT 19  ALKPHOS 45  BILITOT 1.7*  PROT 7.9  ALBUMIN 4.7   No results for input(s): LIPASE, AMYLASE in the last 168 hours. No results for input(s): AMMONIA in the last 168 hours. Coagulation Profile: No results for input(s): INR,  PROTIME in the last 168 hours. Cardiac Enzymes: No results for input(s): CKTOTAL, CKMB, CKMBINDEX, TROPONINI in the last 168 hours. BNP (last 3 results) No results for input(s): PROBNP in the last 8760 hours. HbA1C: No results for input(s): HGBA1C in the last 72 hours. CBG: No results for input(s): GLUCAP in the last 168 hours. Lipid Profile: No results for input(s): CHOL, HDL, LDLCALC, TRIG, CHOLHDL, LDLDIRECT in the last 72 hours. Thyroid Function Tests: No results for input(s): TSH, T4TOTAL, FREET4, T3FREE, THYROIDAB in the last 72 hours. Anemia Panel: Recent Labs    03/05/21 1428  RETICCTPCT 3.8*   Urine analysis:    Component Value Date/Time   COLORURINE YELLOW 10/24/2019 0230   APPEARANCEUR CLEAR 10/24/2019 0230   LABSPEC 1.016 10/24/2019  0230   PHURINE 5.0 10/24/2019 0230   GLUCOSEU NEGATIVE 10/24/2019 0230   HGBUR NEGATIVE 10/24/2019 0230   BILIRUBINUR neg 04/07/2020 1014   KETONESUR NEGATIVE 10/24/2019 0230   PROTEINUR Negative 04/07/2020 1014   PROTEINUR NEGATIVE 10/24/2019 0230   UROBILINOGEN 0.2 04/07/2020 1014   UROBILINOGEN 1.0 12/25/2012 1755   NITRITE neg 04/07/2020 1014   NITRITE NEGATIVE 10/24/2019 0230   LEUKOCYTESUR Small (1+) (A) 04/07/2020 1014   LEUKOCYTESUR TRACE (A) 10/24/2019 0230    Radiological Exams on Admission: DG Chest 2 View  Result Date: 03/05/2021 CLINICAL DATA:  Back and chest pain for the past 3 days. History of sickle cell anemia. EXAM: CHEST - 2 VIEW COMPARISON:  Chest x-ray dated January 17, 2021. FINDINGS: The heart size and mediastinal contours are within normal limits. Normal pulmonary vascularity. No focal consolidation, pleural effusion, or pneumothorax. No acute osseous abnormality. Chronic humeral head sclerosis and H-shaped vertebral bodies. IMPRESSION: No active cardiopulmonary disease. Electronically Signed   By: Obie Dredge M.D.   On: 03/05/2021 15:02   CT Angio Chest PE W and/or Wo Contrast  Result Date:  03/05/2021 CLINICAL DATA:  Chest pain. Sickle cell crisis worsening over the past 2-3 days. EXAM: CT ANGIOGRAPHY CHEST WITH CONTRAST TECHNIQUE: Multidetector CT imaging of the chest was performed using the standard protocol during bolus administration of intravenous contrast. Multiplanar CT image reconstructions and MIPs were obtained to evaluate the vascular anatomy. CONTRAST:  18mL OMNIPAQUE IOHEXOL 350 MG/ML SOLN COMPARISON:  07/26/2018 FINDINGS: Cardiovascular: The heart is within normal limits in size. No pericardial effusion. The aorta is normal in caliber. No dissection or atherosclerotic calcification. No coronary artery calcifications. The pulmonary arterial tree is well opacified. No filling defects to suggest pulmonary embolism. Mediastinum/Nodes: No mediastinal or hilar mass or adenopathy. The esophagus is grossly normal. Lungs/Pleura: Patchy mosaic pattern of ill-defined ground-glass opacity in both lungs. Findings could be due to atypical/viral pneumonia, asymmetric edema, small airways disease such as asthma or bronchiolitis. Cryptogenic organizing pneumonia and hypersensitivity pneumonitis would also be less likely considerations. No focal pneumonia or pleural effusion. Upper Abdomen: No significant upper abdominal findings. Very small calcified spleen. Musculoskeletal: No acute bony findings. Stable H-shaped vertebrae consistent with sickle cell disease. 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 Review of the MIP images confirms the above findings. IMPRESSION: 1. No CT findings for pulmonary embolism. 2. Normal thoracic aorta. 3. Patchy mosaic pattern of ill-defined ground-glass opacity in both lungs could be due to atypical/viral pneumonia, asymmetric edema, small airways disease such as asthma or bronchiolitis. Cryptogenic organizing pneumonia and hypersensitivity pneumonitis would also be less likely considerations. 4. Bony findings of sickle cell disease.  Electronically Signed   By: Rudie Meyer M.D.   On: 03/05/2021 19:02    EKG: Personally reviewed. Normal sinus rhythm without acute ischemic changes.  Assessment/Plan Active Problems:   Sickle cell pain crisis (HCC)   Lindsey [name] is a 50 y.o. 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  Sickle cell pain crisis: -Start on weight-based Dilaudid PCA pump on admission -IV Toradol 15 mg every 6 hours -Continue home OxyContin 10 mg every 12 hours -Continue IVFs with D5-1/2 NS@100  mL/hour overnight  Anemia of chronic disease: Hemoglobin stable.  Abnormal CT: Nonspecific mosaic pattern of ill-defined groundglass opacity seen in both lungs on CTA chest.  This appears to  be new when compared to prior CT in March 2020.  Patient is not hypoxic or having respiratory symptoms at time of admission.  She is afebrile.  Will continue on oral azithromycin for now, discontinue ceftriaxone.  Use albuterol inhaler and supplemental oxygen as needed.  DVT prophylaxis: Lovenox Code Status: Full code Family Communication: Discussed with patient, she has discussed with family Disposition Plan: From home, likely discharge to home pending clinical progress Consults called: None Level of care: Med-Surg Admission status:  Status is: Observation  The patient remains OBS appropriate and will d/c before 2 midnights.   Darreld Mclean MD [facility]  If 7PM-7AM, please contact night-coverage [email or web address]  03/05/2021, 8:45 PM

## 2021-03-05 NOTE — ED Notes (Addendum)
Patient's significant other came out of the room and told the writer that he needed assistance. Writer was busy triaging a patient and did not hear the call bell. Patient began threatening that he was watching the writing. [employer] off duty was at the desk and the patient's SO began telling the [facility] officer that he was not intimidated by her. Patient also began recording everyone at the desk including a patiaent that said she hoped he was not recording her. Patient finally went into the patient's room, but when writer walked a patient to the bathroom , patient's SO began saying, "remember I am watching you."

## 2021-03-05 NOTE — ED Provider Notes (Signed)
[facility]-EMERGENCY DEPT Provider Note   CSN: [id number] Arrival date & time: 03/05/21  1359     History Chief Complaint  Patient presents with   Sickle Cell Pain Crisis    Lindsey [name] is a 50 y.o. female.  HPI  50 year old female with a history of sickle cell disease, opiate abuse, who presents the emergency department today for evaluation of sickle cell crisis.  States that she has been having mid/lower back pain for the last 3 days.  Is progressively worsened since onset and pain is constant in nature and is severe.  Is unrelieved by her home medications.  She reports associated chest pain shortness of breath but she not had any cough or upper respiratory symptoms or fevers.  She denies any abdominal pain.  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   Opioid dependence in remission (HCC)    Sickle cell crisis [facility])     Patient Active Problem List   Diagnosis Date Noted   Acute kidney injury (HCC) 01/18/2021   Cough 06/27/2020   Hypokalemia 06/27/2020   Acute pain of left shoulder    Right leg pain    Sickle cell anemia with crisis (HCC) 09/10/2019   Anemia of chronic disease 09/10/2019   Current mild episode of major depressive disorder (HCC)    Sickle cell crisis (HCC) 02/28/2019   Opioid dependence in remission (HCC) 10/01/2017   Sickle cell disease (HCC) 10/01/2017   Chronic pain 10/01/2017   Opiate abuse, 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     Family History  Problem Relation Age of Onset   Stroke Neg Hx        none that she knows of    Seizures Neg Hx     Social History   Tobacco Use   Smoking  status: Some Days    Packs/day: 0.00    Types: Cigars, Cigarettes   Smokeless tobacco: Never  Vaping Use   Vaping Use: Former  Substance Use Topics   Alcohol use: Yes    Comment: occasionally   Drug use: No    Home Medications Prior to Admission medications   Medication Sig Start Date End Date Taking? Authorizing Provider  albuterol (VENTOLIN HFA) 108 (90 Base) MCG/ACT inhaler INHALE 2 PUFFS INTO THE LUNGS EVERY 6 HOURS AS NEEDED FOR WHEEZING OR SHORTNESS OF BREATH Patient not taking: No sig reported 06/01/20   Massie Maroon, FNP  ergocalciferol (VITAMIN D2) 1.25 MG (50000 UT) capsule Take 1 capsule (50,000 Units total) by mouth once a week. Patient not taking: No sig reported 10/16/19   Massie Maroon, FNP  gabapentin (NEURONTIN) 400 MG capsule Take 1 capsule (400 mg total) by mouth 3 (three) times daily. 12/01/20   Massie Maroon, FNP  ibuprofen (ADVIL) 800 MG tablet Take 1 tablet (800 mg total) by mouth every 8 (eight) hours as needed. TAKE 1 TABLET(800 MG) BY MOUTH EVERY 8 HOURS AS NEEDED 11/30/20   Massie Maroon, FNP  oxyCODONE (OXYCONTIN) 10 mg 12 hr tablet Take 1 tablet (10 mg total) by mouth every 12 (  twelve) hours. 03/03/21   Massie Maroon, FNP  Oxycodone HCl 10 MG TABS Take 1 tablet (10 mg total) by mouth every 6 (six) hours as needed (pain). 03/03/21   Massie Maroon, FNP    Allergies    Ketamine  Review of Systems   Review of Systems  Constitutional:  Negative for fever.  HENT:  Negative for ear pain and sore throat.   Eyes:  Negative for visual disturbance.  Respiratory:  Positive for shortness of breath. Negative for cough.   Cardiovascular:  Positive for chest pain.  Gastrointestinal:  Positive for nausea and vomiting. Negative for abdominal pain, constipation and diarrhea.  Genitourinary:  Negative for dysuria and hematuria.  Musculoskeletal:  Positive for back pain.  Skin:  Negative for color change and rash.  Neurological:  Negative for  seizures and syncope.  All other systems reviewed and are negative.  Physical Exam Updated Vital Signs BP (!) 129/91   Pulse 80   Temp 98.3 F (36.8 C) (Oral)   Resp 12   Ht 5' (1.524 m)   Wt 61.2 kg   SpO2 98%   BMI 26.37 kg/m   Physical Exam Vitals and nursing note reviewed.  Constitutional:      General: She is in acute distress.     Appearance: She is well-developed.  HENT:     Head: Normocephalic and atraumatic.  Eyes:     Conjunctiva/sclera: Conjunctivae normal.  Cardiovascular:     Rate and Rhythm: Normal rate and regular rhythm.     Heart sounds: Normal heart sounds. No murmur heard. Pulmonary:     Effort: Pulmonary effort is normal. No respiratory distress.     Breath sounds: Normal breath sounds. No wheezing, rhonchi or rales.  Abdominal:     General: Bowel sounds are normal.     Palpations: Abdomen is soft.     Tenderness: There is no abdominal tenderness. There is no guarding or rebound.  Musculoskeletal:     Cervical back: Neck supple.     Comments: No midline ttp  Skin:    General: Skin is warm and dry.  Neurological:     Mental Status: She is alert.     Comments: Ambulatory with steady gait and normal coordination    ED Results / Procedures / Treatments   Labs (all labs ordered are listed, but only abnormal results are displayed) Labs Reviewed  CBC WITH DIFFERENTIAL/PLATELET - Abnormal; Notable for the following components:      Result Value   WBC 12.5 (*)    RBC 3.27 (*)    Hemoglobin 10.5 (*)    HCT 28.3 (*)    MCHC 37.1 (*)    Platelets 416 (*)    nRBC 0.4 (*)    Abs Immature Granulocytes 0.38 (*)    All other components within normal limits  COMPREHENSIVE METABOLIC PANEL - Abnormal; Notable for the following components:   Total Bilirubin 1.7 (*)    All other components within normal limits  RETICULOCYTES - Abnormal; Notable for the following components:   Retic Ct Pct 3.8 (*)    RBC. 3.24 (*)    Immature Retic Fract 25.6 (*)    All  other components within normal limits  RESP PANEL BY RT-PCR (FLU A&B, COVID) ARPGX2  I-STAT BETA HCG BLOOD, ED ([facility], [facility], [facility] ONLY)  TROPONIN I (HIGH SENSITIVITY)  TROPONIN I (HIGH SENSITIVITY)    EKG None  Radiology DG Chest 2 View  Result Date: 03/05/2021 CLINICAL DATA:  Back and chest pain for the past 3 days. History of sickle cell anemia. EXAM: CHEST - 2 VIEW COMPARISON:  Chest x-ray dated January 17, 2021. FINDINGS: The heart size and mediastinal contours are within normal limits. Normal pulmonary vascularity. No focal consolidation, pleural effusion, or pneumothorax. No acute osseous abnormality. Chronic humeral head sclerosis and H-shaped vertebral bodies. IMPRESSION: No active cardiopulmonary disease. Electronically Signed   By: Obie Dredge M.D.   On: 03/05/2021 15:02   CT Angio Chest PE W and/or Wo Contrast  Result Date: 03/05/2021 CLINICAL DATA:  Chest pain. Sickle cell crisis worsening over the past 2-3 days. EXAM: CT ANGIOGRAPHY CHEST WITH CONTRAST TECHNIQUE: Multidetector CT imaging of the chest was performed using the standard protocol during bolus administration of intravenous contrast. Multiplanar CT image reconstructions and MIPs were obtained to evaluate the vascular anatomy. CONTRAST:  53mL OMNIPAQUE IOHEXOL 350 MG/ML SOLN COMPARISON:  07/26/2018 FINDINGS: Cardiovascular: The heart is within normal limits in size. No pericardial effusion. The aorta is normal in caliber. No dissection or atherosclerotic calcification. No coronary artery calcifications. The pulmonary arterial tree is well opacified. No filling defects to suggest pulmonary embolism. Mediastinum/Nodes: No mediastinal or hilar mass or adenopathy. The esophagus is grossly normal. Lungs/Pleura: Patchy mosaic pattern of ill-defined ground-glass opacity in both lungs. Findings could be due to atypical/viral pneumonia, asymmetric edema, small airways disease such as asthma or bronchiolitis. Cryptogenic organizing  pneumonia and hypersensitivity pneumonitis would also be less likely considerations. No focal pneumonia or pleural effusion. Upper Abdomen: No significant upper abdominal findings. Very small calcified spleen. Musculoskeletal: No acute bony findings. Stable H-shaped vertebrae consistent with sickle cell disease. 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 Review of the MIP images confirms the above findings. IMPRESSION: 1. No CT findings for pulmonary embolism. 2. Normal thoracic aorta. 3. Patchy mosaic pattern of ill-defined ground-glass opacity in both lungs could be due to atypical/viral pneumonia, asymmetric edema, small airways disease such as asthma or bronchiolitis. Cryptogenic organizing pneumonia and hypersensitivity pneumonitis would also be less likely considerations. 4. Bony findings of sickle cell disease. Electronically Signed   By: Rudie Meyer M.D.   On: 03/05/2021 19:02    Procedures Procedures  CRITICAL CARE Performed by: Karrie Meres   Total critical care time: 48 minutes  Critical care time was exclusive of separately billable procedures and treating other patients.  Critical care was necessary to treat or prevent imminent or life-threatening deterioration.  Critical care was time spent personally by me on the following activities: development of treatment plan with patient and/or surrogate as well as nursing, discussions with consultants, evaluation of patient's response to treatment, examination of patient, obtaining history from patient or surrogate, ordering and performing treatments and interventions, ordering and review of laboratory studies, ordering and review of radiographic studies, pulse oximetry and re-evaluation of patient's condition.    Medications Ordered in ED Medications  0.45 % sodium chloride infusion ( Intravenous New Bag/Given 03/05/21 1948)  ondansetron (ZOFRAN) injection 4 mg (4 mg Intravenous Given 03/05/21 1639)   cefTRIAXone (ROCEPHIN) 1 g in sodium chloride 0.9 % 100 mL IVPB (has no administration in time range)  azithromycin (ZITHROMAX) 500 mg in sodium chloride 0.9 % 250 mL IVPB (has no administration in time range)  morphine 4 MG/ML injection 8 mg (has no administration in time range)  ketorolac (TORADOL) 15 MG/ML injection 15 mg (15 mg Intravenous Given 03/05/21 1642)  HYDROmorphone (DILAUDID) injection 2 mg (2 mg Intravenous Given 03/05/21 1642)  HYDROmorphone (DILAUDID) injection 2 mg (2 mg Intravenous Given 03/05/21 1734)  diphenhydrAMINE (BENADRYL) injection 25 mg (25 mg Intravenous Given 03/05/21 1640)  HYDROmorphone (DILAUDID) injection 2 mg (2 mg Intravenous Given 03/05/21 1812)  iohexol (OMNIPAQUE) 350 MG/ML injection 80 mL (80 mLs Intravenous Contrast Given 03/05/21 1833)    ED Course  I have reviewed the triage vital signs and the nursing notes.  Pertinent labs & imaging results that were available during my care of the patient were reviewed by me and considered in my medical decision making (see chart for details).    MDM Rules/Calculators/A&P                          50 y/o F presents for eval of sick cell crisis. She suspects this was triggered by the weather change. C/o back pain. Also has some cp/sob but denies cough or fevers  Reviewed/interpreted labs CBC with mild leukocytosis, anemia appears mild and improved from prior CMP with mild bilirubinemia, otherwise unremarkable Immature retic frac increased Trop neg Beta hc neg COVID pending on admission  EKG - with NSR, no acute ischemic changes  Reviewed/interpreted imaging CXR - No active cardiopulmonary disease. CTA chest - 1. No CT findings for pulmonary embolism. 2. Normal thoracic aorta. 3. Patchy mosaic pattern of ill-defined ground-glass opacity in both lungs could be due to atypical/viral pneumonia, asymmetric edema, small airways disease such as asthma or bronchiolitis. Cryptogenic organizing pneumonia and  hypersensitivity pneumonitis would also be less likely considerations. 4. Bony findings of sickle cell disease  Pt was given multiple rounds of pain meds in the ed and she continues to have intractable pain. Feel she will require admission for further tx of her sickle cell crisis  7:41 PM CONSULT with Dr. Allena Katz with hospitalist service who accepts patient for admission   Final Clinical Impression(s) / ED Diagnoses Final diagnoses:  Sickle cell pain crisis [facility])    Rx / DC Orders ED Discharge Orders     None        Rayne Du 03/05/21 1949    Bethann Berkshire, MD 03/06/21 1210

## 2021-03-05 NOTE — ED Triage Notes (Signed)
Patient c/o sickle cell pain in her lower back x 2-3 days.

## 2021-03-05 NOTE — ED Provider Notes (Signed)
Emergency Medicine Provider Triage Evaluation Note  Lindsey [name] , a 50 y.o. female  was evaluated in triage.  Pt complains of sickle cell crisis that has been worsening over the last 2-3 days. C/o pain to the lower back which is not typical for her. Further c/o chest pain and sob. Denies fevers, cough or other systemic sxs.  Review of Systems  Positive: Back pain, chest pain, sob Negative: Fevers, cough  Physical Exam  BP (!) 146/103 (BP Location: Left Arm)   Pulse (!) 114   Temp 98.3 F (36.8 C) (Oral)   Resp (!) 22   Ht 5' (1.524 m)   Wt 61.2 kg   SpO2 98%   BMI 26.37 kg/m  Gen:   Awake, appears uncomfortable Resp:  Normal effort  MSK:   Moves extremities without difficulty  Other:  Tearful, moving all extremities  Medical Decision Making  Medically screening exam initiated at 2:25 PM.  Appropriate orders placed.  Lindsey [name] was informed that the remainder of the evaluation will be completed by another provider, this initial triage assessment does not replace that evaluation, and the importance of remaining in the ED until their evaluation is complete.     Lindsey [name] 03/05/21 1427    Lindsey Score, MD 03/05/21 1534

## 2021-03-06 DIAGNOSIS — D638 Anemia in other chronic diseases classified elsewhere: Secondary | ICD-10-CM

## 2021-03-06 DIAGNOSIS — D72829 Elevated white blood cell count, unspecified: Secondary | ICD-10-CM

## 2021-03-06 LAB — BASIC METABOLIC PANEL
Anion gap: 5 (ref 5–15)
BUN: 9 mg/dL (ref 6–20)
CO2: 25 mmol/L (ref 22–32)
Calcium: 8.7 mg/dL — ABNORMAL LOW (ref 8.9–10.3)
Chloride: 108 mmol/L (ref 98–111)
Creatinine, Ser: 0.79 mg/dL (ref 0.44–1.00)
GFR, Estimated: >60 mL/min (ref >60)
Glucose, Bld: 132 mg/dL — ABNORMAL HIGH (ref 70–99)
Potassium: 4.2 mmol/L (ref 3.5–5.1)
Sodium: 138 mmol/L (ref 135–145)

## 2021-03-06 LAB — CBC
HCT: 21.9 % — ABNORMAL LOW (ref 36.0–46.0)
Hemoglobin: 8.4 g/dL — ABNORMAL LOW (ref 12.0–15.0)
MCH: 32.7 pg (ref 26.0–34.0)
MCHC: 38.4 g/dL — ABNORMAL HIGH (ref 30.0–36.0)
MCV: 85.2 fL (ref 80.0–100.0)
Platelets: 306 10*3/uL (ref 150–400)
RBC: 2.57 MIL/uL — ABNORMAL LOW (ref 3.87–5.11)
RDW: 14.4 % (ref 11.5–15.5)
WBC: 12.8 10*3/uL — ABNORMAL HIGH (ref 4.0–10.5)
nRBC: 1.2 % — ABNORMAL HIGH (ref 0.0–0.2)

## 2021-03-06 MED ORDER — HYDROMORPHONE HCL 1 MG/ML IJ SOLN
1.0000 mg | Freq: Once | INTRAMUSCULAR | Status: AC
  Administered 2021-03-06: 1 mg via INTRAVENOUS
  Filled 2021-03-06: qty 1

## 2021-03-06 MED ORDER — SODIUM CHLORIDE 0.45 % IV SOLN: INTRAVENOUS | Status: DC

## 2021-03-06 NOTE — ED Notes (Signed)
Patient stating that pain medication is not working. MD paged regarding patient's pain.

## 2021-03-06 NOTE — ED Notes (Signed)
Patient calling out for more pain medication. Floor coverage made aware of patient's continued pain.

## 2021-03-06 NOTE — Progress Notes (Signed)
Dr.[name] made aware that the patient's blood pressure is 65/42 and we have not started the PCA. He advised to give the patient a dose of toradol now.

## 2021-03-06 NOTE — Progress Notes (Signed)
Patient ID: Lindsey [name], female   DOB: 08/06/1970, 50 y.o.   MRN: [id number] Subjective: Lindsey [name] is a 50 year old female with a medical history significant for sickle cell disease, chronic pain syndrome, opiate dependence and tolerance, and history of anemia of chronic disease that was admitted and sickle cell crisis.  Patient is very tearful this morning, writhing in pain, complaints of generalized body pain, 10 out of 10, achy, throbbing and shooting. Pain is mostly in lower back and lower extremities especially both knee joints. No redness. She denies any fever, cough, chest pain, shortness of breath, nausea, vomiting or diarrhea.  Objective:  Vital signs in last 24 hours:  Vitals:   03/06/21 0656 03/06/21 0710 03/06/21 0900 03/06/21 0950  BP: (!) 65/42 108/80 112/88 104/70  Pulse: 74 84 88 76  Resp: 16 16 16 16   Temp: (!) 97.5 F (36.4 C)  97.6 F (36.4 C) 97.8 F (36.6 C)  TempSrc: Oral  Oral   SpO2: 97%  96% 95%  Weight:      Height:       Intake/Output from previous day:   Intake/Output Summary (Last 24 hours) at 03/06/2021 1408 Last data filed at 03/06/2021 1000 Gross per 24 hour  Intake 948.83 ml  Output 1 ml  Net 947.83 ml   Physical Exam: General: Alert, awake, oriented x3, in pain distress.  HEENT: [place]/AT [place]ERL, EOMI Neck: Trachea midline,  no masses, no thyromegal,y no JVD, no carotid bruit OROPHARYNX:  Moist, No exudate/ erythema/lesions.  Heart: Regular rate and rhythm, without murmurs, rubs, gallops, PMI non-displaced, no heaves or thrills on palpation.  Lungs: Clear to auscultation, no wheezing or rhonchi noted. No increased vocal fremitus resonant to percussion  Abdomen: Soft, nontender, nondistended, positive bowel sounds, no masses no hepatosplenomegaly noted..  Neuro: No focal neurological deficits noted cranial nerves II through XII grossly intact. DTRs 2+ bilaterally upper and lower extremities. Strength 5 out of 5 in bilateral upper and  lower extremities. Musculoskeletal: No warm swelling or erythema around joints, no spinal tenderness noted. Psychiatric: Patient alert and oriented x3, good insight and cognition, good recent to remote recall. Lymph node survey: No cervical axillary or inguinal lymphadenopathy noted.  Lab Results:  Basic Metabolic Panel:    Component Value Date/Time   NA 138 03/06/2021 0740   NA 140 01/07/2020 1127   K 4.2 03/06/2021 0740   CL 108 03/06/2021 0740   CO2 25 03/06/2021 0740   BUN 9 03/06/2021 0740   BUN 12 01/07/2020 1127   CREATININE 0.79 03/06/2021 0740   GLUCOSE 132 (H) 03/06/2021 0740   CALCIUM 8.7 (L) 03/06/2021 0740   CBC:    Component Value Date/Time   WBC 12.8 (H) 03/06/2021 0740   HGB 8.4 (L) 03/06/2021 0740   HGB 11.5 01/07/2020 1127   HCT 21.9 (L) 03/06/2021 0740   HCT 34.3 01/07/2020 1127   PLT 306 03/06/2021 0740   PLT 436 01/07/2020 1127   MCV 85.2 03/06/2021 0740   MCV 96 01/07/2020 1127   NEUTROABS 7.7 03/05/2021 1428   NEUTROABS 7.6 (H) 01/07/2020 1127   LYMPHSABS 3.3 03/05/2021 1428   LYMPHSABS 2.0 01/07/2020 1127   MONOABS 0.6 03/05/2021 1428   EOSABS 0.4 03/05/2021 1428   EOSABS 0.4 01/07/2020 1127   BASOSABS 0.1 03/05/2021 1428   BASOSABS 0.1 01/07/2020 1127    Recent Results (from the past 240 hour(s))  Resp Panel by RT-PCR (Flu A&B, Covid) Nasopharyngeal Swab     Status: None  Collection Time: 03/05/21  9:09 PM   Specimen: Nasopharyngeal Swab; Nasopharyngeal(NP) swabs in vial transport medium  Result Value Ref Range Status   SARS Coronavirus 2 by RT PCR NEGATIVE NEGATIVE Final    Comment: (NOTE) SARS-CoV-2 target nucleic acids are NOT DETECTED.  The SARS-CoV-2 RNA is generally detectable in upper respiratory specimens during the acute phase of infection. The lowest concentration of SARS-CoV-2 viral copies this assay can detect is 138 copies/mL. A negative result does not preclude SARS-Cov-2 infection and should not be used as the sole  basis for treatment or other patient management decisions. A negative result may occur with  improper specimen collection/handling, submission of specimen other than nasopharyngeal swab, presence of viral mutation(s) within the areas targeted by this assay, and inadequate number of viral copies(<138 copies/mL). A negative result must be combined with clinical observations, patient history, and epidemiological information. The expected result is Negative.  Fact Sheet for Patients:  BloggerCourse.com  Fact Sheet for Healthcare Providers:  SeriousBroker.it  This test is no t yet approved or cleared by the [place] FDA and  has been authorized for detection and/or diagnosis of SARS-CoV-2 by FDA under an Emergency Use Authorization (EUA). This EUA will remain  in effect (meaning this test can be used) for the duration of the COVID-19 declaration under Section 564(b)(1) of the Act, 21 U.S.C.section 360bbb-3(b)(1), unless the authorization is terminated  or revoked sooner.       Influenza A by PCR NEGATIVE NEGATIVE Final   Influenza B by PCR NEGATIVE NEGATIVE Final    Comment: (NOTE) The Xpert Xpress SARS-CoV-2/FLU/RSV plus assay is intended as an aid in the diagnosis of influenza from Nasopharyngeal swab specimens and should not be used as a sole basis for treatment. Nasal washings and aspirates are unacceptable for Xpert Xpress SARS-CoV-2/FLU/RSV testing.  Fact Sheet for Patients: BloggerCourse.com  Fact Sheet for Healthcare Providers: SeriousBroker.it  This test is not yet approved or cleared by the [place] FDA and has been authorized for detection and/or diagnosis of SARS-CoV-2 by FDA under an Emergency Use Authorization (EUA). This EUA will remain in effect (meaning this test can be used) for the duration of the COVID-19 declaration under Section 564(b)(1) of the Act,  21 U.S.C. section 360bbb-3(b)(1), unless the authorization is terminated or revoked.  Performed at [facility], [address] [place], [place] [address]     Studies/Results: DG Chest 2 View  Result Date: 03/05/2021 CLINICAL DATA:  Back and chest pain for the past 3 days. History of sickle cell anemia. EXAM: CHEST - 2 VIEW COMPARISON:  Chest x-ray dated January 17, 2021. FINDINGS: The heart size and mediastinal contours are within normal limits. Normal pulmonary vascularity. No focal consolidation, pleural effusion, or pneumothorax. No acute osseous abnormality. Chronic humeral head sclerosis and H-shaped vertebral bodies. IMPRESSION: No active cardiopulmonary disease. Electronically Signed   By: Obie Dredge M.D.   On: 03/05/2021 15:02   CT Angio Chest PE W and/or Wo Contrast  Result Date: 03/05/2021 CLINICAL DATA:  Chest pain. Sickle cell crisis worsening over the past 2-3 days. EXAM: CT ANGIOGRAPHY CHEST WITH CONTRAST TECHNIQUE: Multidetector CT imaging of the chest was performed using the standard protocol during bolus administration of intravenous contrast. Multiplanar CT image reconstructions and MIPs were obtained to evaluate the vascular anatomy. CONTRAST:  95mL OMNIPAQUE IOHEXOL 350 MG/ML SOLN COMPARISON:  07/26/2018 FINDINGS: Cardiovascular: The heart is within normal limits in size. No pericardial effusion. The aorta is normal in caliber. No  dissection or atherosclerotic calcification. No coronary artery calcifications. The pulmonary arterial tree is well opacified. No filling defects to suggest pulmonary embolism. Mediastinum/Nodes: No mediastinal or hilar mass or adenopathy. The esophagus is grossly normal. Lungs/Pleura: Patchy mosaic pattern of ill-defined ground-glass opacity in both lungs. Findings could be due to atypical/viral pneumonia, asymmetric edema, small airways disease such as asthma or bronchiolitis. Cryptogenic organizing pneumonia and  hypersensitivity pneumonitis would also be less likely considerations. No focal pneumonia or pleural effusion. Upper Abdomen: No significant upper abdominal findings. Very small calcified spleen. Musculoskeletal: No acute bony findings. Stable H-shaped vertebrae consistent with sickle cell disease. 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 Review of the MIP images confirms the above findings. IMPRESSION: 1. No CT findings for pulmonary embolism. 2. Normal thoracic aorta. 3. Patchy mosaic pattern of ill-defined ground-glass opacity in both lungs could be due to atypical/viral pneumonia, asymmetric edema, small airways disease such as asthma or bronchiolitis. Cryptogenic organizing pneumonia and hypersensitivity pneumonitis would also be less likely considerations. 4. Bony findings of sickle cell disease. Electronically Signed   By: Rudie Meyer M.D.   On: 03/05/2021 19:02    Medications: Scheduled Meds:  azithromycin  250 mg Oral Daily   enoxaparin (LOVENOX) injection  40 mg Subcutaneous Q24H   gabapentin  400 mg Oral TID   HYDROmorphone   Intravenous Q4H   influenza vac split quadrivalent PF  0.5 mL Intramuscular Tomorrow-1000   ketorolac  15 mg Intravenous Q6H   oxyCODONE  10 mg Oral Q12H   senna-docusate  1 tablet Oral BID   Continuous Infusions:  sodium chloride 100 mL/hr at 03/06/21 1156   diphenhydrAMINE     PRN Meds:.albuterol, alum & mag hydroxide-simeth, diphenhydrAMINE **OR** diphenhydrAMINE, hydrOXYzine, naloxone **AND** sodium chloride flush, ondansetron **OR** ondansetron (ZOFRAN) IV, polyethylene glycol  Consultants: None  Procedures: None  Antibiotics: None  Assessment/Plan: Principal Problem:   Sickle cell pain crisis (HCC)  Hb Sickle Cell Disease with crisis: Change IVF TO 0.45% Saline @ 100 mls/hour, continue weight based Dilaudid PCA at current setting, continue IV Toradol 15 mg Q 6 H for total of 5 days, will restart and continue oral  home medications.  Monitor vitals very closely, Re-evaluate pain scale regularly, 2 L of Oxygen by [place]. Leukocytosis: Mild.  No fever.  Patient is on azithromycin for chest x-ray findings.  We will continue for now. Sickle Cell Anemia: Hemoglobin is stable at baseline today.  There is no clinical indication for blood transfusion at this time.  We will follow very closely Chronic pain Syndrome: Restart and continue home pain medications.  Code Status: Full Code Family Communication: N/A Disposition Plan: Not yet ready for discharge  Tyrell Brereton  If 7PM-7AM, please contact night-coverage.  03/06/2021, 2:08 PM  LOS: 0 days

## 2021-03-06 NOTE — Progress Notes (Signed)
Admitted from 1311.  Pt settled in bed with kpad. Reinforced pca pain meds, taught husband that only the patient is to push the pca button.  No requests at this time.

## 2021-03-07 ENCOUNTER — Inpatient Hospital Stay (HOSPITAL_COMMUNITY): Payer: Medicaid Other

## 2021-03-07 LAB — CBC WITH DIFFERENTIAL/PLATELET
Abs Immature Granulocytes: 0.12 10*3/uL — ABNORMAL HIGH (ref 0.00–0.07)
Basophils Absolute: 0 10*3/uL (ref 0.0–0.1)
Basophils Relative: 0 %
Eosinophils Absolute: 0 10*3/uL (ref 0.0–0.5)
Eosinophils Relative: 0 %
HCT: 18.5 % — ABNORMAL LOW (ref 36.0–46.0)
Hemoglobin: 6.8 g/dL — CL (ref 12.0–15.0)
Immature Granulocytes: 1 %
Lymphocytes Relative: 17 %
Lymphs Abs: 2 10*3/uL (ref 0.7–4.0)
MCH: 31.8 pg (ref 26.0–34.0)
MCHC: 36.8 g/dL — ABNORMAL HIGH (ref 30.0–36.0)
MCV: 86.4 fL (ref 80.0–100.0)
Monocytes Absolute: 0.7 10*3/uL (ref 0.1–1.0)
Monocytes Relative: 6 %
Neutro Abs: 9.2 10*3/uL — ABNORMAL HIGH (ref 1.7–7.7)
Neutrophils Relative %: 76 %
Platelets: 195 10*3/uL (ref 150–400)
RBC: 2.14 MIL/uL — ABNORMAL LOW (ref 3.87–5.11)
RDW: 15.1 % (ref 11.5–15.5)
WBC: 12.1 10*3/uL — ABNORMAL HIGH (ref 4.0–10.5)
nRBC: 3.8 % — ABNORMAL HIGH (ref 0.0–0.2)

## 2021-03-07 LAB — HEMOGLOBIN AND HEMATOCRIT, BLOOD
HCT: 21.1 % — ABNORMAL LOW (ref 36.0–46.0)
Hemoglobin: 8.1 g/dL — ABNORMAL LOW (ref 12.0–15.0)

## 2021-03-07 LAB — PROCALCITONIN: Procalcitonin: 0.3 ng/mL

## 2021-03-07 LAB — LACTIC ACID, PLASMA: Lactic Acid, Venous: 1.9 mmol/L (ref 0.5–1.9)

## 2021-03-07 MED ORDER — SODIUM CHLORIDE 0.9 % IV SOLN
2.0000 g | INTRAVENOUS | Status: DC
  Administered 2021-03-07 – 2021-03-09 (×3): 2 g via INTRAVENOUS
  Filled 2021-03-07 (×4): qty 20

## 2021-03-07 MED ORDER — ACETAMINOPHEN 500 MG PO TABS
1000.0000 mg | ORAL_TABLET | Freq: Four times a day (QID) | ORAL | Status: DC | PRN
  Administered 2021-03-07 – 2021-03-13 (×9): 1000 mg via ORAL
  Filled 2021-03-07 (×9): qty 2

## 2021-03-07 MED ORDER — SODIUM CHLORIDE 0.9 % IV SOLN: 2.0000 mg/h | Freq: Once | INTRAVENOUS | Status: DC

## 2021-03-07 MED ORDER — SODIUM CHLORIDE 0.9 % IV SOLN
500.0000 mg | INTRAVENOUS | Status: DC
  Administered 2021-03-07 – 2021-03-09 (×3): 500 mg via INTRAVENOUS
  Filled 2021-03-07 (×4): qty 500

## 2021-03-07 MED ORDER — HYDROMORPHONE HCL 2 MG/ML IJ SOLN
2.0000 mg | Freq: Once | INTRAMUSCULAR | Status: AC
  Administered 2021-03-07: 2 mg via INTRAVENOUS
  Filled 2021-03-07: qty 1

## 2021-03-07 NOTE — Plan of Care (Signed)
  Problem: Education: Goal: Knowledge of vaso-occlusive preventative measures will improve Outcome: Progressing Goal: Awareness of infection prevention will improve Outcome: Progressing Goal: Awareness of signs and symptoms of anemia will improve Outcome: Progressing Goal: Long-term complications will improve Outcome: Progressing   

## 2021-03-07 NOTE — Progress Notes (Signed)
   03/07/21 1650  Vitals  MAP (mmHg) 114  Resp (!) 21  Level of Consciousness  Level of Consciousness Alert  MEWS COLOR  MEWS Score Color Yellow  Oxygen Therapy  SpO2 94 %  O2 Device Nasal Cannula  O2 Flow Rate (L/min) 3 L/min  Patient Activity (if Appropriate) In bed  Pulse Oximetry Type Intermittent  End [place] CO2 (EtCO2) 29  MEWS Score  MEWS Temp 0  MEWS Systolic 0  MEWS Pulse 1  MEWS RR 1  MEWS LOC 0  MEWS Score 2  Provider Notification  Provider Name/Title Dr. Hyman Hopes  Date Provider Notified 03/07/21  Time Provider Notified 1658  Notification Type Call  Notification Reason Change in status  Provider response See new orders  Date of Provider Response 03/07/21  Time of Provider Response 1703  Note  Observations Dr, Hyman Hopes came up to see pt  Steward Sedation Scale  Consciousness 2  Airway 1  Movement 2  Total Score 5

## 2021-03-07 NOTE — Progress Notes (Signed)
Patient ID: Lindsey [name], female   DOB: Feb 17, 1971, 50 y.o.   MRN: [id number] Subjective: Lindsey [name] is a 50 year old female with a medical history significant for sickle cell disease, chronic pain syndrome, opiate dependence and tolerance, and history of anemia of chronic disease that was admitted and sickle cell crisis.  Patient was seen sitting on the chair by the bedside, appears clinically stable.  Patient endorses some improvement in her pain and general feeling.  She rates her pain at 6/10 this morning, localized to her lower back and lower extremities.  She denies any fever, cough, chest pain, shortness of breath, nausea, vomiting or diarrhea.  No urinary symptoms.  Objective:  Vital signs in last 24 hours:  Vitals:   03/07/21 0814 03/07/21 0913 03/07/21 1114 03/07/21 1348  BP:   133/78 117/78  Pulse:   88 (!) 110  Resp: (!) 23 19 15 18   Temp:   99.7 F (37.6 C) 99.7 F (37.6 C)  TempSrc:   Oral Oral  SpO2: 92% 92% 90% (!) 84%  Weight:      Height:       Intake/Output from previous day:   Intake/Output Summary (Last 24 hours) at 03/07/2021 1440 Last data filed at 03/07/2021 1259 Gross per 24 hour  Intake 1942.41 ml  Output 0 ml  Net 1942.41 ml    Physical Exam: General: Alert, awake, oriented x3, in pain distress.  HEENT: [place]/AT [place]ER[place], EOMI Neck: Trachea midline,  no masses, no thyromegal,y no JVD, no carotid bruit OROPHARYNX:  Moist, No exudate/ erythema/lesions.  Heart: Regular rate and rhythm, without murmurs, rubs, gallops, PMI non-displaced, no heaves or thrills on palpation.  Lungs: Clear to auscultation, no wheezing or rhonchi noted. No increased vocal fremitus resonant to percussion  Abdomen: Soft, nontender, nondistended, positive bowel sounds, no masses no hepatosplenomegaly noted..  Neuro: No focal neurological deficits noted cranial nerves II through XII grossly intact. DTRs 2+ bilaterally upper and lower extremities. Strength 5 out of 5 in  bilateral upper and lower extremities. Musculoskeletal: No warm swelling or erythema around joints, no spinal tenderness noted. Psychiatric: Patient alert and oriented x3, good insight and cognition, good recent to remote recall. Lymph node survey: No cervical axillary or inguinal lymphadenopathy noted.  Lab Results:  Basic Metabolic Panel:    Component Value Date/Time   NA 138 03/06/2021 0740   NA 140 01/07/2020 1127   K 4.2 03/06/2021 0740   CL 108 03/06/2021 0740   CO2 25 03/06/2021 0740   BUN 9 03/06/2021 0740   BUN 12 01/07/2020 1127   CREATININE 0.79 03/06/2021 0740   GLUCOSE 132 (H) 03/06/2021 0740   CALCIUM 8.7 (L) 03/06/2021 0740   CBC:    Component Value Date/Time   WBC 12.1 (H) 03/07/2021 0544   HGB 8.1 (L) 03/07/2021 0844   HGB 11.5 01/07/2020 1127   HCT 21.1 (L) 03/07/2021 0844   HCT 34.3 01/07/2020 1127   PLT 195 03/07/2021 0544   PLT 436 01/07/2020 1127   MCV 86.4 03/07/2021 0544   MCV 96 01/07/2020 1127   NEUTROABS 9.2 (H) 03/07/2021 0544   NEUTROABS 7.6 (H) 01/07/2020 1127   LYMPHSABS 2.0 03/07/2021 0544   LYMPHSABS 2.0 01/07/2020 1127   MONOABS 0.7 03/07/2021 0544   EOSABS 0.0 03/07/2021 0544   EOSABS 0.4 01/07/2020 1127   BASOSABS 0.0 03/07/2021 0544   BASOSABS 0.1 01/07/2020 1127    Recent Results (from the past 240 hour(s))  Resp Panel by RT-PCR (Flu A&B, Covid)  Nasopharyngeal Swab     Status: None   Collection Time: 03/05/21  9:09 PM   Specimen: Nasopharyngeal Swab; Nasopharyngeal(NP) swabs in vial transport medium  Result Value Ref Range Status   SARS Coronavirus 2 by RT PCR NEGATIVE NEGATIVE Final    Comment: (NOTE) SARS-CoV-2 target nucleic acids are NOT DETECTED.  The SARS-CoV-2 RNA is generally detectable in upper respiratory specimens during the acute phase of infection. The lowest concentration of SARS-CoV-2 viral copies this assay can detect is 138 copies/mL. A negative result does not preclude SARS-Cov-2 infection and should not  be used as the sole basis for treatment or other patient management decisions. A negative result may occur with  improper specimen collection/handling, submission of specimen other than nasopharyngeal swab, presence of viral mutation(s) within the areas targeted by this assay, and inadequate number of viral copies(<138 copies/mL). A negative result must be combined with clinical observations, patient history, and epidemiological information. The expected result is Negative.  Fact Sheet for Patients:  BloggerCourse.com  Fact Sheet for Healthcare Providers:  SeriousBroker.it  This test is no t yet approved or cleared by the [place] FDA and  has been authorized for detection and/or diagnosis of SARS-CoV-2 by FDA under an Emergency Use Authorization (EUA). This EUA will remain  in effect (meaning this test can be used) for the duration of the COVID-19 declaration under Section 564(b)(1) of the Act, 21 U.S.C.section 360bbb-3(b)(1), unless the authorization is terminated  or revoked sooner.       Influenza A by PCR NEGATIVE NEGATIVE Final   Influenza B by PCR NEGATIVE NEGATIVE Final    Comment: (NOTE) The Xpert Xpress SARS-CoV-2/FLU/RSV plus assay is intended as an aid in the diagnosis of influenza from Nasopharyngeal swab specimens and should not be used as a sole basis for treatment. Nasal washings and aspirates are unacceptable for Xpert Xpress SARS-CoV-2/FLU/RSV testing.  Fact Sheet for Patients: BloggerCourse.com  Fact Sheet for Healthcare Providers: SeriousBroker.it  This test is not yet approved or cleared by the [place] FDA and has been authorized for detection and/or diagnosis of SARS-CoV-2 by FDA under an Emergency Use Authorization (EUA). This EUA will remain in effect (meaning this test can be used) for the duration of the COVID-19 declaration under Section  564(b)(1) of the Act, 21 U.S.C. section 360bbb-3(b)(1), unless the authorization is terminated or revoked.  Performed at [facility], [address] [place], [place] [address]     Studies/Results: DG Chest 2 View  Result Date: 03/05/2021 CLINICAL DATA:  Back and chest pain for the past 3 days. History of sickle cell anemia. EXAM: CHEST - 2 VIEW COMPARISON:  Chest x-ray dated January 17, 2021. FINDINGS: The heart size and mediastinal contours are within normal limits. Normal pulmonary vascularity. No focal consolidation, pleural effusion, or pneumothorax. No acute osseous abnormality. Chronic humeral head sclerosis and H-shaped vertebral bodies. IMPRESSION: No active cardiopulmonary disease. Electronically Signed   By: Obie Dredge M.D.   On: 03/05/2021 15:02   CT Angio Chest PE W and/or Wo Contrast  Result Date: 03/05/2021 CLINICAL DATA:  Chest pain. Sickle cell crisis worsening over the past 2-3 days. EXAM: CT ANGIOGRAPHY CHEST WITH CONTRAST TECHNIQUE: Multidetector CT imaging of the chest was performed using the standard protocol during bolus administration of intravenous contrast. Multiplanar CT image reconstructions and MIPs were obtained to evaluate the vascular anatomy. CONTRAST:  33mL OMNIPAQUE IOHEXOL 350 MG/ML SOLN COMPARISON:  07/26/2018 FINDINGS: Cardiovascular: The heart is within normal limits in size.  No pericardial effusion. The aorta is normal in caliber. No dissection or atherosclerotic calcification. No coronary artery calcifications. The pulmonary arterial tree is well opacified. No filling defects to suggest pulmonary embolism. Mediastinum/Nodes: No mediastinal or hilar mass or adenopathy. The esophagus is grossly normal. Lungs/Pleura: Patchy mosaic pattern of ill-defined ground-glass opacity in both lungs. Findings could be due to atypical/viral pneumonia, asymmetric edema, small airways disease such as asthma or bronchiolitis. Cryptogenic organizing  pneumonia and hypersensitivity pneumonitis would also be less likely considerations. No focal pneumonia or pleural effusion. Upper Abdomen: No significant upper abdominal findings. Very small calcified spleen. Musculoskeletal: No acute bony findings. Stable H-shaped vertebrae consistent with sickle cell disease. 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 Review of the MIP images confirms the above findings. IMPRESSION: 1. No CT findings for pulmonary embolism. 2. Normal thoracic aorta. 3. Patchy mosaic pattern of ill-defined ground-glass opacity in both lungs could be due to atypical/viral pneumonia, asymmetric edema, small airways disease such as asthma or bronchiolitis. Cryptogenic organizing pneumonia and hypersensitivity pneumonitis would also be less likely considerations. 4. Bony findings of sickle cell disease. Electronically Signed   By: Rudie Meyer M.D.   On: 03/05/2021 19:02    Medications: Scheduled Meds:  azithromycin  250 mg Oral Daily   enoxaparin (LOVENOX) injection  40 mg Subcutaneous Q24H   gabapentin  400 mg Oral TID   HYDROmorphone   Intravenous Q4H   influenza vac split quadrivalent PF  0.5 mL Intramuscular Tomorrow-1000   ketorolac  15 mg Intravenous Q6H   oxyCODONE  10 mg Oral Q12H   senna-docusate  1 tablet Oral BID   Continuous Infusions:  sodium chloride 100 mL/hr at 03/07/21 0411   diphenhydrAMINE     PRN Meds:.albuterol, alum & mag hydroxide-simeth, diphenhydrAMINE **OR** diphenhydrAMINE, hydrOXYzine, naloxone **AND** sodium chloride flush, ondansetron **OR** ondansetron (ZOFRAN) IV, polyethylene glycol  Consultants: None  Procedures: None  Antibiotics: None  Assessment/Plan: Principal Problem:   Sickle cell pain crisis (HCC) Active Problems:   Leukocytosis   Anemia of chronic disease  Hb Sickle Cell Disease with crisis: Reduce IV fluid to KVO.  Continue weight based Dilaudid PCA at current setting, continue IV Toradol 15 mg Q  6 H for total of 5 days, continue oral home medications. Monitor vitals very closely, Re-evaluate pain scale regularly, 2 L of Oxygen by [place]. Leukocytosis: Improved. Patient is on azithromycin for chest x-ray findings. We will continue for now. Sickle Cell Anemia: Hemoglobin dropped to 6.8 this morning but not at threshold for blood transfusion for this patient. There may be some element of hemodilution. We will stop IV fluid and we will follow very closely and transfuse as appropriate. Chronic pain Syndrome: Continue oral home pain medications.  Code Status: Full Code Family Communication: N/A Disposition Plan: Not yet ready for discharge  Teri Diltz  If 7PM-7AM, please contact night-coverage.  03/07/2021, 2:40 PM  LOS: 1 day

## 2021-03-07 NOTE — Progress Notes (Signed)
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 No  Was patient injured? No  Patient found on floor  Found by Staff-comment Allena Katz, NT)  Stated prior activity ambulating-unassisted  Follow Up  MD notified Margo Aye, MD  Time MD notified [phone]  Family notified Yes - comment  Time family notified 55  Additional tests No  Simple treatment  (NA)  Progress note created (see row info) Yes  Adult Fall Risk Assessment  Risk Factor Category (scoring not indicated) High fall risk per protocol (document High fall risk)  Patient Fall Risk Level High fall risk  Adult Fall Risk Interventions  Required Bundle Interventions *See Row Information* High fall risk - low, moderate, and high requirements implemented  Additional Interventions Use of appropriate toileting equipment (bedpan, BSC, etc.)  Screening for Fall Injury Risk (To be completed on HIGH fall risk patients) - Assessing Need for Floor Mats  Risk For Fall Injury- Criteria for Floor Mats None identified - No additional interventions needed  Vitals  Temp 98.7 F (37.1 C)  Temp Source Oral  BP (!) 127/104  MAP (mmHg) 113  BP Method Automatic  Pulse Rate 85  Pulse Rate Source Monitor  Resp 18  Oxygen Therapy  SpO2 95 %  Pain Assessment  Pain Scale 0-10  Pain Score 8 (from SCC)  Pain Type Acute pain;Chronic pain (SCC)  Pain Location Leg  Pain Orientation Right;Left  Pain Onset On-going  Pain Intervention(s) PCA encouraged  PCA/Epidural/Spinal Assessment  Respiratory Pattern Regular;Unlabored  Neurological  Neuro (WDL) WDL  Musculoskeletal  Musculoskeletal (WDL) WDL   Integumentary  Integumentary (WDL) WDL  Pain Assessment  Date Pain First Started 03/05/21  Result of Injury No  Pain Assessment  Work-Related Injury No

## 2021-03-07 NOTE — Progress Notes (Signed)
Date and time results received: 03/07/21 6:38 AM  (use smartphrase ".now" to insert current time)  Test: Hemoglobin  Critical Value: 6.8  Name of Provider Notified: Margo Aye, MD  Orders Received? Or Actions Taken?: repeat H& H ordered

## 2021-03-07 NOTE — Progress Notes (Signed)
Unwitnessed fall reported by nurse tech. Informed sign nuurse. Post fall huddle and charting will be completed.

## 2021-03-08 DIAGNOSIS — R0902 Hypoxemia: Secondary | ICD-10-CM

## 2021-03-08 LAB — CBC WITH DIFFERENTIAL/PLATELET
Abs Immature Granulocytes: 0.12 10*3/uL — ABNORMAL HIGH (ref 0.00–0.07)
Basophils Absolute: 0 10*3/uL (ref 0.0–0.1)
Basophils Relative: 0 %
Eosinophils Absolute: 0.1 10*3/uL (ref 0.0–0.5)
Eosinophils Relative: 1 %
HCT: 18.4 % — ABNORMAL LOW (ref 36.0–46.0)
Hemoglobin: 6.8 g/dL — CL (ref 12.0–15.0)
Immature Granulocytes: 1 %
Lymphocytes Relative: 24 %
Lymphs Abs: 3.3 10*3/uL (ref 0.7–4.0)
MCH: 31.6 pg (ref 26.0–34.0)
MCHC: 37 g/dL — ABNORMAL HIGH (ref 30.0–36.0)
MCV: 85.6 fL (ref 80.0–100.0)
Monocytes Absolute: 0.9 10*3/uL (ref 0.1–1.0)
Monocytes Relative: 7 %
Neutro Abs: 9.2 10*3/uL — ABNORMAL HIGH (ref 1.7–7.7)
Neutrophils Relative %: 67 %
Platelets: 157 10*3/uL (ref 150–400)
RBC: 2.15 MIL/uL — ABNORMAL LOW (ref 3.87–5.11)
RDW: 15.7 % — ABNORMAL HIGH (ref 11.5–15.5)
WBC: 13.6 10*3/uL — ABNORMAL HIGH (ref 4.0–10.5)
nRBC: 4.9 % — ABNORMAL HIGH (ref 0.0–0.2)

## 2021-03-08 LAB — LACTIC ACID, PLASMA: Lactic Acid, Venous: 1.7 mmol/L (ref 0.5–1.9)

## 2021-03-08 LAB — PREPARE RBC (CROSSMATCH)

## 2021-03-08 MED ORDER — SODIUM CHLORIDE 0.9% IV SOLUTION: Freq: Once | INTRAVENOUS | Status: AC

## 2021-03-08 MED ORDER — LIP MEDEX EX OINT
TOPICAL_OINTMENT | CUTANEOUS | Status: AC
  Filled 2021-03-08: qty 7

## 2021-03-08 NOTE — Progress Notes (Addendum)
Pt has been noncompliant with using call bell and calling for assistance to BSC/bathroom. Pt states " I really had to go to the bathroom"  Pt is weak and does exhibit fall risk. Pt re-educated on importance of using call bell for assistance getting up.  Pt voiced understanding and states " I promise I'll call".  Call bell in reach, non skid socks reapplied, bed alarm on. Purewick placed. Door open. Will monitor.

## 2021-03-08 NOTE — Evaluation (Signed)
Physical Therapy Evaluation Patient Details Name: Lindsey [name] MRN: [id number] DOB: Oct 12, 1970 Today's Date: 03/08/2021  History of Present Illness   Patient is a 50 y.o. female with PMH significant for sickle cell disease, chronic pain syndrome, opiate dependence and tolerance, and history of anemia of chronic disease that was admitted and sickle cell crisis.   Clinical Impression  Lindsey [name] is 50 y.o. female admitted with above HPI and diagnosis. Patient is currently limited by functional impairments below (see PT problem list). Patient lives with her family and is independent at baseline. Patient is limited by pain and currently requires min assist for functional transfers, unable to progress to gait today due to pain. Patient will benefit from continued skilled PT interventions to address impairments and progress independence with mobility, recommending HHPT. Anticipate pt will progress well as pain in controlled. Acute PT will follow and progress as able.        Recommendations for follow up therapy are one component of a multi-disciplinary discharge planning process, led by the attending physician.  Recommendations may be updated based on patient status, additional functional criteria and insurance authorization.  Follow Up Recommendations  HHPT    Assistance Recommended at Discharge  Intermittent   Functional Status Assessment    Equipment Recommendations   None    Recommendations for Other Services       Precautions / Restrictions  Fall       03/08/21 1100  PT Visit Information  Last PT Received On 03/08/21  Assistance Needed +1  History of Present Illness Patient is a 50 y.o. female with PMH significant for sickle cell disease, chronic pain syndrome, opiate dependence and tolerance, and history of anemia of chronic disease that was admitted and sickle cell crisis.  Precautions  Precautions Fall  Restrictions  Weight Bearing Restrictions No  Home  Living  Family/patient expects to be discharged to: Private residence  Living Arrangements Spouse/significant other;Children  Available Help at Discharge Family  Type of Home House  Home Access Stairs to enter  Entrance Stairs-Number of Steps 3  Entrance Stairs-Rails Right  Home Layout Two level;Able to live on main level with bedroom/bathroom;Full bath on main level  Bathroom Nurse, children's Yes  Home Equipment None  Prior Function  Prior Level of Function  Independent/Modified Independent  Communication  Communication No difficulties  Pain Assessment  Pain Assessment Faces  Faces Pain Scale 6  Pain Location generalized sickle cell pain  Pain Descriptors / Indicators Aching;Discomfort  Pain Intervention(s) Limited activity within patient's tolerance;Monitored during session;Repositioned;Heat applied  Cognition  Arousal/Alertness Awake/alert  Behavior During Therapy WFL for tasks assessed/performed  Overall Cognitive Status Within Functional Limits for tasks assessed  General Comments pt not very talkative, clearly in discomfort  Upper Extremity Assessment  Upper Extremity Assessment Overall WFL for tasks assessed  Lower Extremity Assessment  Lower Extremity Assessment Generalized weakness  Cervical / Trunk Assessment  Cervical / Trunk Assessment Normal  Bed Mobility  General bed mobility comments pt sitting EOB with RN at start of session, ended in recliner  Transfers  Overall transfer level Needs assistance  Equipment used 1 person hand held assist  Transfers Sit to/from [employer]  Sit to Stand Min assist  Stand pivot transfers Min assist  General transfer comment cues to initaite and press up from EOB with bil UE's. HHA from therapist to steady with small steps to move bed>BSC. Pt completed small steps back to EOB  and then additional step from bed to recliner with HHA for support.  Balance   Overall balance assessment Needs assistance  Sitting-balance support Feet supported  Sitting balance-Leahy Scale Fair  Sitting balance - Comments pt able to shift weight to perfrom pericare in sitting on BSC  Standing balance support During functional activity;Single extremity supported  Standing balance-Leahy Scale Poor  Standing balance comment reliant on therapist for support  PT - End of Session  Equipment Utilized During Treatment Gait belt  Activity Tolerance Patient tolerated treatment well  Patient left in chair;with call bell/phone within reach;with chair alarm set  Nurse Communication Mobility status  PT Assessment  PT Recommendation/Assessment Patient needs continued PT services  PT Visit Diagnosis Muscle weakness (generalized) (M62.81);Difficulty in walking, not elsewhere classified (R26.2)  PT Problem List Decreased strength;Decreased range of motion;Decreased activity tolerance;Decreased balance;Decreased mobility;Decreased knowledge of use of DME;Decreased knowledge of precautions;Pain  PT Plan  PT Frequency (ACUTE ONLY) Min 3X/week  PT Treatment/Interventions (ACUTE ONLY) DME instruction;Gait training;Stair training;Functional mobility training;Therapeutic activities;Therapeutic exercise;Balance training;Patient/family education  AM-PAC PT "6 Clicks" Mobility Outcome Measure (Version 2)  Help needed turning from your back to your side while in a flat bed without using bedrails? 3  Help needed moving from lying on your back to sitting on the side of a flat bed without using bedrails? 3  Help needed moving to and from a bed to a chair (including a wheelchair)? 3  Help needed standing up from a chair using your arms (e.g., wheelchair or bedside chair)? 3  Help needed to walk in hospital room? 3  Help needed climbing 3-5 steps with a railing?  2  6 Click Score 17  Consider Recommendation of Discharge To: Home with [facility]  Progressive Mobility  What is the highest level of  mobility based on the progressive mobility assessment? Level 4 (Walks with assist in room) - Balance while marching in place and cannot step forward and back - Complete  Mobility Out of bed for toileting;Out of bed to chair with meals;Ambulated with assistance in room  PT Recommendation  Follow Up Recommendations Home health PT  Assistance recommended at discharge Intermittent Supervision/Assistance  Functional Status Assessment Patient has had a recent decline in their functional status and demonstrates the ability to make significant improvements in function in a reasonable and predictable amount of time.  PT equipment None recommended by PT  Individuals Consulted  Consulted and Agree with Results and Recommendations Patient  Acute Rehab PT Goals  Patient Stated Goal get back independence  PT Goal Formulation With patient  Time For Goal Achievement 03/15/21  Potential to Achieve Goals Good  PT Time Calculation  PT Start Time (ACUTE ONLY) 1127  PT Stop Time (ACUTE ONLY) 1151  PT Time Calculation (min) (ACUTE ONLY) 24 min  PT General Charges  $$ ACUTE PT VISIT 1 Visit  PT Evaluation  $PT Eval Low Complexity 1 Low  PT Treatments  $Therapeutic Activity 8-22 mins  Written Expression  Dominant Hand Right     Wynn Maudlin, DPT Acute Rehabilitation Services Office [phone] Pager [phone]    Anitra Lauth 03/08/2021, 7:20 PM

## 2021-03-08 NOTE — Progress Notes (Signed)
Therapeutic phlebotomy done ; blood drawn,pt tolerated well. RN aware.

## 2021-03-08 NOTE — Progress Notes (Signed)
Pt noted to be very weak this AM with assistance to the bathroom. Pt educated on fall risk. Nonskid socks intact. Bed alarm on. Call bell in reach.

## 2021-03-08 NOTE — Progress Notes (Signed)
   03/08/21 1338  Assess: MEWS Score  Temp 100 F (37.8 C)  BP (!) 112/38  Pulse Rate (!) 126  Resp 20  SpO2 94 %  Assess: MEWS Score  MEWS Temp 0  MEWS Systolic 0  MEWS Pulse 2  MEWS RR 0  MEWS LOC 0  MEWS Score 2  MEWS Score Color Yellow  Assess: if the MEWS score is Yellow or Red  Were vital signs taken at a resting state? Yes  Focused Assessment Change from prior assessment (see assessment flowsheet)  Does the patient meet 2 or more of the SIRS criteria? No  MEWS guidelines implemented *See Row Information* Yes  Treat  MEWS Interventions Administered prn meds/treatments  Pain Scale 0-10  Pain Score 10  Pain Type Chronic pain (pt on pCA pump)  Take Vital Signs  Increase Vital Sign Frequency  Yellow: Q 2hr X 2 then Q 4hr X 2, if remains yellow, continue Q 4hrs  Escalate  MEWS: Escalate Yellow: discuss with charge nurse/RN and consider discussing with provider and RRT  Notify: Charge Nurse/RN  Name of Charge Nurse/RN Notified Kim RN  Date Charge Nurse/RN Notified 03/08/21  Time Charge Nurse/RN Notified 1342  Document  Patient Outcome Other (Comment) (will monitor)  Progress note created (see row info) Yes  Assess: SIRS CRITERIA  SIRS Temperature  0  SIRS Pulse 1  SIRS Respirations  0  SIRS WBC 0  SIRS Score Sum  1

## 2021-03-08 NOTE — Progress Notes (Signed)
Date and time results received: 03/08/21 0611 (use smartphrase ".now" to insert current time)  Test: Hgb Critical Value: 6.8  Name of Provider Notified: B.Chotiner  Orders Received? Or Actions Taken?: Orders Received - See Orders for detailsProcedures

## 2021-03-08 NOTE — Progress Notes (Signed)
Patient ID: Lindsey [name], female   DOB: June 02, 1970, 50 y.o.   MRN: [id number] Subjective: Lindsey [name] is a 50 year old female with a medical history significant for sickle cell disease, chronic pain syndrome, opiate dependence and tolerance, and history of anemia of chronic disease that was admitted and sickle cell crisis.  Patient developed high-grade fever yesterday, blood culture was sent to the lab and was started on broad-spectrum antibiotics with IV ceftriaxone and azithromycin.  Today, patient is complaining of generalized weakness/extreme fatigue, only walk with support.  She is also very anxious, saying that she is scared but no specific reason given for being scared.  She continues to complain of significant pain mostly in her lower extremities especially both knees.  There is no joint swelling or redness.  She is still having low-grade fever.  She denies any cough, chest pain, shortness of breath, nausea, vomiting or diarrhea.  Objective:  Vital signs in last 24 hours:  Vitals:   03/08/21 1500 03/08/21 1515 03/08/21 1629 03/08/21 1805  BP:  113/76  124/80  Pulse: (!) 104 (!) 103  97  Resp:  20 18 17   Temp:  98.9 F (37.2 C)  99.5 F (37.5 C)  TempSrc:  Oral  Oral  SpO2:  98% 98% 92%  Weight:      Height:       Intake/Output from previous day:   Intake/Output Summary (Last 24 hours) at 03/08/2021 1832 Last data filed at 03/08/2021 1805 Gross per 24 hour  Intake 2082.97 ml  Output 400 ml  Net 1682.97 ml    Physical Exam: General: Alert, awake, oriented x3, in pain distress.  Anxious looking HEENT: [place]/AT PEERL, EOMI Neck: Trachea midline,  no masses, no thyromegal,y no JVD, no carotid bruit OROPHARYNX:  Moist, No exudate/ erythema/lesions.  Heart: Regular rate and rhythm, without murmurs, rubs, gallops, PMI non-displaced, no heaves or thrills on palpation.  Lungs: Clear to auscultation, no wheezing or rhonchi noted. No increased vocal fremitus resonant to  percussion  Abdomen: Soft, nontender, nondistended, positive bowel sounds, no masses no hepatosplenomegaly noted..  Neuro: No focal neurological deficits noted cranial nerves II through XII grossly intact. DTRs 2+ bilaterally upper and lower extremities. Strength 5 out of 5 in bilateral upper and lower extremities. Musculoskeletal: No warm swelling or erythema around joints, no spinal tenderness noted. Psychiatric: Patient alert and oriented x3, good insight and cognition, good recent to remote recall. Lymph node survey: No cervical axillary or inguinal lymphadenopathy noted.  Lab Results:  Basic Metabolic Panel:    Component Value Date/Time   NA 138 03/06/2021 0740   NA 140 01/07/2020 1127   K 4.2 03/06/2021 0740   CL 108 03/06/2021 0740   CO2 25 03/06/2021 0740   BUN 9 03/06/2021 0740   BUN 12 01/07/2020 1127   CREATININE 0.79 03/06/2021 0740   GLUCOSE 132 (H) 03/06/2021 0740   CALCIUM 8.7 (L) 03/06/2021 0740   CBC:    Component Value Date/Time   WBC 13.6 (H) 03/08/2021 0526   HGB 6.8 (LL) 03/08/2021 0526   HGB 11.5 01/07/2020 1127   HCT 18.4 (L) 03/08/2021 0526   HCT 34.3 01/07/2020 1127   PLT 157 03/08/2021 0526   PLT 436 01/07/2020 1127   MCV 85.6 03/08/2021 0526   MCV 96 01/07/2020 1127   NEUTROABS 9.2 (H) 03/08/2021 0526   NEUTROABS 7.6 (H) 01/07/2020 1127   LYMPHSABS 3.3 03/08/2021 0526   LYMPHSABS 2.0 01/07/2020 1127   MONOABS 0.9 03/08/2021 0526  EOSABS 0.1 03/08/2021 0526   EOSABS 0.4 01/07/2020 1127   BASOSABS 0.0 03/08/2021 0526   BASOSABS 0.1 01/07/2020 1127    Recent Results (from the past 240 hour(s))  Resp Panel by RT-PCR (Flu A&B, Covid) Nasopharyngeal Swab     Status: None   Collection Time: 03/05/21  9:09 PM   Specimen: Nasopharyngeal Swab; Nasopharyngeal(NP) swabs in vial transport medium  Result Value Ref Range Status   SARS Coronavirus 2 by RT PCR NEGATIVE NEGATIVE Final    Comment: (NOTE) SARS-CoV-2 target nucleic acids are NOT  DETECTED.  The SARS-CoV-2 RNA is generally detectable in upper respiratory specimens during the acute phase of infection. The lowest concentration of SARS-CoV-2 viral copies this assay can detect is 138 copies/mL. A negative result does not preclude SARS-Cov-2 infection and should not be used as the sole basis for treatment or other patient management decisions. A negative result may occur with  improper specimen collection/handling, submission of specimen other than nasopharyngeal swab, presence of viral mutation(s) within the areas targeted by this assay, and inadequate number of viral copies(<138 copies/mL). A negative result must be combined with clinical observations, patient history, and epidemiological information. The expected result is Negative.  Fact Sheet for Patients:  BloggerCourse.com  Fact Sheet for Healthcare Providers:  SeriousBroker.it  This test is no t yet approved or cleared by the [place] FDA and  has been authorized for detection and/or diagnosis of SARS-CoV-2 by FDA under an Emergency Use Authorization (EUA). This EUA will remain  in effect (meaning this test can be used) for the duration of the COVID-19 declaration under Section 564(b)(1) of the Act, 21 U.S.C.section 360bbb-3(b)(1), unless the authorization is terminated  or revoked sooner.       Influenza A by PCR NEGATIVE NEGATIVE Final   Influenza B by PCR NEGATIVE NEGATIVE Final    Comment: (NOTE) The Xpert Xpress SARS-CoV-2/FLU/RSV plus assay is intended as an aid in the diagnosis of influenza from Nasopharyngeal swab specimens and should not be used as a sole basis for treatment. Nasal washings and aspirates are unacceptable for Xpert Xpress SARS-CoV-2/FLU/RSV testing.  Fact Sheet for Patients: BloggerCourse.com  Fact Sheet for Healthcare Providers: SeriousBroker.it  This test is not yet  approved or cleared by the [place] FDA and has been authorized for detection and/or diagnosis of SARS-CoV-2 by FDA under an Emergency Use Authorization (EUA). This EUA will remain in effect (meaning this test can be used) for the duration of the COVID-19 declaration under Section 564(b)(1) of the Act, 21 U.S.C. section 360bbb-3(b)(1), unless the authorization is terminated or revoked.  Performed at [facility], [address], [place], [place] [address]   Culture, blood (routine x 2)     Status: None (Preliminary result)   Collection Time: 03/07/21  8:15 PM   Specimen: BLOOD  Result Value Ref Range Status   Specimen Description   Final    BLOOD BLOOD RIGHT HAND Performed at [facility], [address] [place], [place] [address]    Special Requests   Final    BOTTLES DRAWN AEROBIC ONLY Blood Culture adequate volume Performed at [facility], [address], [place], [place] [address]    Culture   Final    NO GROWTH < 12 HOURS Performed at [facility], [address] [place], [place] [address]    Report Status PENDING  Incomplete    Studies/Results: DG Chest 2 View  Result Date: 03/07/2021 CLINICAL DATA:  Hypoxia. Additional history provided: Hypoxia, pain to the lower legs. Sickle cell crisis. Smoker. EXAM: CHEST - 2 VIEW COMPARISON:  CT angiogram chest 03/05/2021. Chest radiographs 03/05/2021. FINDINGS: Borderline cardiomegaly. Patchy airspace disease within the right greater than left lungs, progressed from the chest radiographs of 03/05/2021. No evidence of pleural effusion or pneumothorax. Redemonstrated osseous sequela of sickle cell disease. IMPRESSION: Patchy airspace disease within the right greater than left lungs, progressed from the chest radiographs of 03/05/2021. Borderline cardiomegaly. Electronically Signed   By: Jackey Loge D.O.   On: 03/07/2021 20:14    Medications: Scheduled Meds:   enoxaparin (LOVENOX) injection  40 mg Subcutaneous Q24H   gabapentin  400 mg Oral TID   HYDROmorphone   Intravenous Q4H   influenza vac split quadrivalent PF  0.5 mL Intramuscular Tomorrow-1000   ketorolac  15 mg Intravenous Q6H   oxyCODONE  10 mg Oral Q12H   senna-docusate  1 tablet Oral BID   Continuous Infusions:  sodium chloride 100 mL/hr at 03/08/21 0534   azithromycin 500 mg (03/07/21 2044)   cefTRIAXone (ROCEPHIN)  IV 2 g (03/07/21 1830)   diphenhydrAMINE     PRN Meds:.acetaminophen, albuterol, alum & mag hydroxide-simeth, diphenhydrAMINE **OR** diphenhydrAMINE, hydrOXYzine, naloxone **AND** sodium chloride flush, ondansetron **OR** ondansetron (ZOFRAN) IV, polyethylene glycol  Consultants: None  Procedures: None  Antibiotics: None  Assessment/Plan: Principal Problem:   Sickle cell pain crisis (HCC) Active Problems:   Leukocytosis   Anemia of chronic disease  Hb Sickle Cell Disease with crisis: IV fluid restarted yesterday at 100 cc/h due to high-grade fever and poor appetite, fever is now low-grade, will reduce IV fluid rate to 50 cc an hour. Continue weight based Dilaudid PCA at current setting, continue IV Toradol 15 mg Q 6 H for total of 5 days, continue oral home medications. Monitor vitals very closely, Re-evaluate pain scale regularly, 2 L of Oxygen by [place]. Leukocytosis: With high-grade fever yesterday, patient was started on IV ceftriaxone and azithromycin was changed from p.o. to IV.  We will continue both antibiotics for now.  Await blood cultures and monitor vital signs very closely.   Sickle Cell Anemia: Hemoglobin dropped to 6.8 this morning.  In review of symptoms of extreme fatigue and ongoing fever as well as chest x-ray findings, will perform partial exchange transfusion with 1 unit out and 2 units in.  Will reassess and repeat labs in AM. Chronic pain Syndrome: Continue oral home pain medications.  Code Status: Full Code Family Communication:  N/A Disposition Plan: Not yet ready for discharge  Ixchel Duck  If 7PM-7AM, please contact night-coverage.  03/08/2021, 6:32 PM  LOS: 2 days

## 2021-03-08 NOTE — Progress Notes (Signed)
MD Hyman Hopes notified for clarification on therapeutic phlebotomy. Pt is to receive prior to 2units PRBC Notified blood bank and spoke with Marisue Ivan who states only 1unit of PRBC available at this time.  Awaiting MD response.  Will notifiy IV team once order is clarified.

## 2021-03-08 NOTE — Progress Notes (Signed)
Purewick placed at this time. Pt educated on necessity for equipment and voiced understanding.  Consent for blood obtained and placed in pts chart. Notified by blood bank this AM that pt blood will not be ready for few hours due to pts phenotype and will have to be ordered.  Pt resting in bed at this time. Bed alarm on.

## 2021-03-09 LAB — COMPREHENSIVE METABOLIC PANEL
ALT: 82 U/L — ABNORMAL HIGH (ref 0–44)
AST: 94 U/L — ABNORMAL HIGH (ref 15–41)
Albumin: 3.5 g/dL (ref 3.5–5.0)
Alkaline Phosphatase: 162 U/L — ABNORMAL HIGH (ref 38–126)
Anion gap: 9 (ref 5–15)
BUN: 26 mg/dL — ABNORMAL HIGH (ref 6–20)
CO2: 23 mmol/L (ref 22–32)
Calcium: 8.1 mg/dL — ABNORMAL LOW (ref 8.9–10.3)
Chloride: 109 mmol/L (ref 98–111)
Creatinine, Ser: 0.88 mg/dL (ref 0.44–1.00)
GFR, Estimated: >60 mL/min (ref >60)
Glucose, Bld: 118 mg/dL — ABNORMAL HIGH (ref 70–99)
Potassium: 4.1 mmol/L (ref 3.5–5.1)
Sodium: 141 mmol/L (ref 135–145)
Total Bilirubin: 2.4 mg/dL — ABNORMAL HIGH (ref 0.3–1.2)
Total Protein: 6.1 g/dL — ABNORMAL LOW (ref 6.5–8.1)

## 2021-03-09 LAB — CBC WITH DIFFERENTIAL/PLATELET
Abs Immature Granulocytes: 0.13 10*3/uL — ABNORMAL HIGH (ref 0.00–0.07)
Basophils Absolute: 0 10*3/uL (ref 0.0–0.1)
Basophils Relative: 0 %
Eosinophils Absolute: 0.1 10*3/uL (ref 0.0–0.5)
Eosinophils Relative: 1 %
HCT: 23.8 % — ABNORMAL LOW (ref 36.0–46.0)
Hemoglobin: 8.8 g/dL — ABNORMAL LOW (ref 12.0–15.0)
Immature Granulocytes: 1 %
Lymphocytes Relative: 21 %
Lymphs Abs: 2.6 10*3/uL (ref 0.7–4.0)
MCH: 30.1 pg (ref 26.0–34.0)
MCHC: 37 g/dL — ABNORMAL HIGH (ref 30.0–36.0)
MCV: 81.5 fL (ref 80.0–100.0)
Monocytes Absolute: 0.9 10*3/uL (ref 0.1–1.0)
Monocytes Relative: 7 %
Neutro Abs: 8.7 10*3/uL — ABNORMAL HIGH (ref 1.7–7.7)
Neutrophils Relative %: 70 %
Platelets: 162 10*3/uL (ref 150–400)
RBC: 2.92 MIL/uL — ABNORMAL LOW (ref 3.87–5.11)
RDW: 18.8 % — ABNORMAL HIGH (ref 11.5–15.5)
WBC: 12.4 10*3/uL — ABNORMAL HIGH (ref 4.0–10.5)
nRBC: 12.6 % — ABNORMAL HIGH (ref 0.0–0.2)

## 2021-03-09 NOTE — Progress Notes (Cosign Needed)
Subjective: Lindsey [name] is a 50 year old female with a medical history significant for sickle cell disease, chronic pain syndrome, opiate dependence and tolerance, and history of anemia of chronic disease was admitted for sickle cell crisis.  Today, patient is afebrile.  Blood cultures negative so far.  She has been continued on antibiotics.  Patient continues to endorse fatigue.  She says that ambulation has improved.  Her pain intensity is 6/10 primarily to low back and lower extremities.  She denies any persistent cough, sore throat, urinary Cabbell dizziness, urinary symptoms, nausea, vomiting, or diarrhea.  Objective:  Vital signs in last 24 hours:  Vitals:   03/09/21 0515 03/09/21 0833 03/09/21 1000 03/09/21 1217  BP: 102/74  (!) 154/81   Pulse: 98  90   Resp: 16 (!) 21 14 (!) 21  Temp: 100 F (37.8 C)  98.8 F (37.1 C)   TempSrc: Oral  Oral   SpO2: 90% 94%  95%  Weight:      Height:        Intake/Output from previous day:   Intake/Output Summary (Last 24 hours) at 03/09/2021 1318 Last data filed at 03/08/2021 2147 Gross per 24 hour  Intake 633 ml  Output 400 ml  Net 233 ml    Physical Exam: General: Alert, awake, oriented x3, in no acute distress.  HEENT: [place]/AT PEERL, EOMI Neck: Trachea midline,  no masses, no thyromegal,y no JVD, no carotid bruit OROPHARYNX:  Moist, No exudate/ erythema/lesions.  Heart: Regular rate and rhythm, without murmurs, rubs, gallops, PMI non-displaced, no heaves or thrills on palpation.  Lungs: Clear to auscultation, no wheezing or rhonchi noted. No increased vocal fremitus resonant to percussion  Abdomen: Soft, nontender, nondistended, positive bowel sounds, no masses no hepatosplenomegaly noted..  Neuro: No focal neurological deficits noted cranial nerves II through XII grossly intact. DTRs 2+ bilaterally upper and lower extremities. Strength 5 out of 5 in bilateral upper and lower extremities. Musculoskeletal: No warm swelling or  erythema around joints, no spinal tenderness noted. Psychiatric: Patient alert and oriented x3, good insight and cognition, good recent to remote recall. Lymph node survey: No cervical axillary or inguinal lymphadenopathy noted.  Lab Results:  Basic Metabolic Panel:    Component Value Date/Time   NA 141 03/09/2021 0508   NA 140 01/07/2020 1127   K 4.1 03/09/2021 0508   CL 109 03/09/2021 0508   CO2 23 03/09/2021 0508   BUN 26 (H) 03/09/2021 0508   BUN 12 01/07/2020 1127   CREATININE 0.88 03/09/2021 0508   GLUCOSE 118 (H) 03/09/2021 0508   CALCIUM 8.1 (L) 03/09/2021 0508   CBC:    Component Value Date/Time   WBC 12.4 (H) 03/09/2021 0508   HGB 8.8 (L) 03/09/2021 0508   HGB 11.5 01/07/2020 1127   HCT 23.8 (L) 03/09/2021 0508   HCT 34.3 01/07/2020 1127   PLT 162 03/09/2021 0508   PLT 436 01/07/2020 1127   MCV 81.5 03/09/2021 0508   MCV 96 01/07/2020 1127   NEUTROABS 8.7 (H) 03/09/2021 0508   NEUTROABS 7.6 (H) 01/07/2020 1127   LYMPHSABS 2.6 03/09/2021 0508   LYMPHSABS 2.0 01/07/2020 1127   MONOABS 0.9 03/09/2021 0508   EOSABS 0.1 03/09/2021 0508   EOSABS 0.4 01/07/2020 1127   BASOSABS 0.0 03/09/2021 0508   BASOSABS 0.1 01/07/2020 1127    Recent Results (from the past 240 hour(s))  Resp Panel by RT-PCR (Flu A&B, Covid) Nasopharyngeal Swab     Status: None   Collection Time: 03/05/21  9:09 PM   Specimen: Nasopharyngeal Swab; Nasopharyngeal(NP) swabs in vial transport medium  Result Value Ref Range Status   SARS Coronavirus 2 by RT PCR NEGATIVE NEGATIVE Final    Comment: (NOTE) SARS-CoV-2 target nucleic acids are NOT DETECTED.  The SARS-CoV-2 RNA is generally detectable in upper respiratory specimens during the acute phase of infection. The lowest concentration of SARS-CoV-2 viral copies this assay can detect is 138 copies/mL. A negative result does not preclude SARS-Cov-2 infection and should not be used as the sole basis for treatment or other patient management  decisions. A negative result may occur with  improper specimen collection/handling, submission of specimen other than nasopharyngeal swab, presence of viral mutation(s) within the areas targeted by this assay, and inadequate number of viral copies(<138 copies/mL). A negative result must be combined with clinical observations, patient history, and epidemiological information. The expected result is Negative.  Fact Sheet for Patients:  BloggerCourse.com  Fact Sheet for Healthcare Providers:  SeriousBroker.it  This test is no t yet approved or cleared by the [place] FDA and  has been authorized for detection and/or diagnosis of SARS-CoV-2 by FDA under an Emergency Use Authorization (EUA). This EUA will remain  in effect (meaning this test can be used) for the duration of the COVID-19 declaration under Section 564(b)(1) of the Act, 21 U.S.C.section 360bbb-3(b)(1), unless the authorization is terminated  or revoked sooner.       Influenza A by PCR NEGATIVE NEGATIVE Final   Influenza B by PCR NEGATIVE NEGATIVE Final    Comment: (NOTE) The Xpert Xpress SARS-CoV-2/FLU/RSV plus assay is intended as an aid in the diagnosis of influenza from Nasopharyngeal swab specimens and should not be used as a sole basis for treatment. Nasal washings and aspirates are unacceptable for Xpert Xpress SARS-CoV-2/FLU/RSV testing.  Fact Sheet for Patients: BloggerCourse.com  Fact Sheet for Healthcare Providers: SeriousBroker.it  This test is not yet approved or cleared by the [place] FDA and has been authorized for detection and/or diagnosis of SARS-CoV-2 by FDA under an Emergency Use Authorization (EUA). This EUA will remain in effect (meaning this test can be used) for the duration of the COVID-19 declaration under Section 564(b)(1) of the Act, 21 U.S.C. section 360bbb-3(b)(1), unless the  authorization is terminated or revoked.  Performed at [facility], [address], [place], [place] [address]   Culture, blood (routine x 2)     Status: None (Preliminary result)   Collection Time: 03/07/21  8:15 PM   Specimen: BLOOD  Result Value Ref Range Status   Specimen Description   Final    BLOOD BLOOD RIGHT HAND Performed at [facility], [address] [place], [place] [address]    Special Requests   Final    BOTTLES DRAWN AEROBIC ONLY Blood Culture adequate volume Performed at [facility], [address] [place], [place] [address]    Culture   Final    NO GROWTH 2 DAYS Performed at [facility], [address], [place], [place] [address]    Report Status PENDING  Incomplete  Culture, blood (routine x 2)     Status: None (Preliminary result)   Collection Time: 03/07/21 11:06 PM   Specimen: BLOOD  Result Value Ref Range Status   Specimen Description   Final    BLOOD BLOOD RIGHT HAND Performed at [facility], [address], [place], [place] [address]    Special Requests   Final  BOTTLES DRAWN AEROBIC ONLY Blood Culture adequate volume Performed at [facility], [address] [place], [place] [address]    Culture   Final    NO GROWTH < 24 HOURS Performed at [facility], [address], [place], [place] [address]    Report Status PENDING  Incomplete    Studies/Results: DG Chest 2 View  Result Date: 03/07/2021 CLINICAL DATA:  Hypoxia. Additional history provided: Hypoxia, pain to the lower legs. Sickle cell crisis. Smoker. EXAM: CHEST - 2 VIEW COMPARISON:  CT angiogram chest 03/05/2021. Chest radiographs 03/05/2021. FINDINGS: Borderline cardiomegaly. Patchy airspace disease within the right greater than left lungs, progressed from the chest radiographs of 03/05/2021. No evidence of pleural effusion or pneumothorax. Redemonstrated osseous sequela of  sickle cell disease. IMPRESSION: Patchy airspace disease within the right greater than left lungs, progressed from the chest radiographs of 03/05/2021. Borderline cardiomegaly. Electronically Signed   By: Jackey Loge D.O.   On: 03/07/2021 20:14    Medications: Scheduled Meds:  enoxaparin (LOVENOX) injection  40 mg Subcutaneous Q24H   gabapentin  400 mg Oral TID   HYDROmorphone   Intravenous Q4H   influenza vac split quadrivalent PF  0.5 mL Intramuscular Tomorrow-1000   ketorolac  15 mg Intravenous Q6H   oxyCODONE  10 mg Oral Q12H   senna-docusate  1 tablet Oral BID   Continuous Infusions:  sodium chloride 50 mL/hr at 03/08/21 1842   azithromycin 500 mg (03/08/21 2320)   cefTRIAXone (ROCEPHIN)  IV 2 g (03/08/21 2146)   diphenhydrAMINE     PRN Meds:.acetaminophen, albuterol, alum & mag hydroxide-simeth, diphenhydrAMINE **OR** diphenhydrAMINE, hydrOXYzine, naloxone **AND** sodium chloride flush, ondansetron **OR** ondansetron (ZOFRAN) IV, polyethylene glycol  Consultants: None  Procedures: None  Antibiotics: IV cefepime IV azithromycin  Assessment/Plan: Principal Problem:   Sickle cell pain crisis (HCC) Active Problems:   Leukocytosis   Anemia of chronic disease   Hypoxia  Sickle cell disease with pain crisis: Decrease IV fluids to KVO Continue IV Dilaudid PCA, settings decreased today Restart home medications.  OxyContin 10 mg every 12 hours Oxycodone 10 mg every 6 hours as needed for severe breakthrough pain Toradol 15 mg IV every 6 hours Monitor vital signs very closely, reevaluate pain scale regularly, and supplemental oxygen as needed  Leukocytosis: Stable.  Patient afebrile.  Blood cultures negative.  Continue IV antibiotics.  If patient afebrile overnight, consider transitioning to oral antibiotics on tomorrow.  Continue to follow closely.  Fever of unknown origin Appears to be resolved.  Blood cultures negative.  Chest x-ray showed patchy airspace disease with  right greater than left lungs.  We will continue to follow closely.  Continue antibiotics.  Reassess in AM.  Chronic pain syndrome: Continue home medications  Anemia of chronic disease: Hemoglobin has improved to 8.8 g/dL, which is slightly below patient's baseline.  There is no clinical indication for blood transfusions at this time.  Continue to follow closely.  Code Status: Full Code Family Communication: N/A Disposition Plan: Not yet ready for discharge  [facility] Rennis Petty  APRN, MSN, FNP-C [facility] [address] [place], [place] [address] [phone]  If 5PM-8AM, please contact night-coverage.  03/09/2021, 1:18 PM  LOS: 3 days

## 2021-03-10 ENCOUNTER — Inpatient Hospital Stay (HOSPITAL_COMMUNITY): Payer: Medicaid Other

## 2021-03-10 ENCOUNTER — Encounter (HOSPITAL_COMMUNITY): Payer: Self-pay | Admitting: Internal Medicine

## 2021-03-10 LAB — TYPE AND SCREEN
ABO/RH(D): A POS
Antibody Screen: NEGATIVE
Unit division: 0
Unit division: 0
Unit division: 0
Unit division: 0

## 2021-03-10 LAB — BPAM RBC
Blood Product Expiration Date: 202211172359
Blood Product Expiration Date: 202211172359
Blood Product Expiration Date: 202212032359
Blood Product Expiration Date: 202212032359
ISSUE DATE / TIME: 202210311447
ISSUE DATE / TIME: 202210311826
ISSUE DATE / TIME: 202211021410
Unit Type and Rh: 5100
Unit Type and Rh: 5100
Unit Type and Rh: 9500
Unit Type and Rh: 9500

## 2021-03-10 LAB — DIFFERENTIAL
Abs Immature Granulocytes: 0.2 10*3/uL — ABNORMAL HIGH (ref 0.00–0.07)
Basophils Absolute: 0 10*3/uL (ref 0.0–0.1)
Basophils Relative: 0 %
Eosinophils Absolute: 0 10*3/uL (ref 0.0–0.5)
Eosinophils Relative: 0 %
Immature Granulocytes: 1 %
Lymphocytes Relative: 22 %
Lymphs Abs: 3.1 10*3/uL (ref 0.7–4.0)
Monocytes Absolute: 1.2 10*3/uL — ABNORMAL HIGH (ref 0.1–1.0)
Monocytes Relative: 8 %
Neutro Abs: 9.4 10*3/uL — ABNORMAL HIGH (ref 1.7–7.7)
Neutrophils Relative %: 69 %

## 2021-03-10 LAB — CBC
HCT: 22.2 % — ABNORMAL LOW (ref 36.0–46.0)
Hemoglobin: 8 g/dL — ABNORMAL LOW (ref 12.0–15.0)
MCH: 30 pg (ref 26.0–34.0)
MCHC: 36 g/dL (ref 30.0–36.0)
MCV: 83.1 fL (ref 80.0–100.0)
Platelets: 148 10*3/uL — ABNORMAL LOW (ref 150–400)
RBC: 2.67 MIL/uL — ABNORMAL LOW (ref 3.87–5.11)
RDW: 20.7 % — ABNORMAL HIGH (ref 11.5–15.5)
WBC: 14.4 10*3/uL — ABNORMAL HIGH (ref 4.0–10.5)
nRBC: 31.4 % — ABNORMAL HIGH (ref 0.0–0.2)

## 2021-03-10 LAB — HEPATIC FUNCTION PANEL
ALT: 103 U/L — ABNORMAL HIGH (ref 0–44)
AST: 103 U/L — ABNORMAL HIGH (ref 15–41)
Albumin: 3.4 g/dL — ABNORMAL LOW (ref 3.5–5.0)
Alkaline Phosphatase: 171 U/L — ABNORMAL HIGH (ref 38–126)
Bilirubin, Direct: 1 mg/dL — ABNORMAL HIGH (ref 0.0–0.2)
Indirect Bilirubin: 1.4 mg/dL — ABNORMAL HIGH (ref 0.3–0.9)
Total Bilirubin: 2.4 mg/dL — ABNORMAL HIGH (ref 0.3–1.2)
Total Protein: 6.3 g/dL — ABNORMAL LOW (ref 6.5–8.1)

## 2021-03-10 LAB — LACTATE DEHYDROGENASE: LDH: 1017 U/L — ABNORMAL HIGH (ref 98–192)

## 2021-03-10 LAB — TECHNOLOGIST SMEAR REVIEW

## 2021-03-10 LAB — PREPARE RBC (CROSSMATCH)

## 2021-03-10 MED ORDER — ACETAMINOPHEN 325 MG PO TABS
650.0000 mg | ORAL_TABLET | Freq: Once | ORAL | Status: AC
  Administered 2021-03-10: 650 mg via ORAL
  Filled 2021-03-10: qty 2

## 2021-03-10 MED ORDER — VANCOMYCIN HCL IN DEXTROSE 1-5 GM/200ML-% IV SOLN
1000.0000 mg | INTRAVENOUS | Status: DC
  Administered 2021-03-11: 1000 mg via INTRAVENOUS
  Filled 2021-03-10 (×2): qty 200

## 2021-03-10 MED ORDER — DIPHENHYDRAMINE HCL 50 MG/ML IJ SOLN
25.0000 mg | Freq: Once | INTRAMUSCULAR | Status: AC
Start: 2021-03-10 — End: 2021-03-10
  Administered 2021-03-10: 25 mg via INTRAVENOUS
  Filled 2021-03-10: qty 1

## 2021-03-10 MED ORDER — VANCOMYCIN HCL 1250 MG/250ML IV SOLN
1250.0000 mg | Freq: Once | INTRAVENOUS | Status: AC
  Administered 2021-03-10: 1250 mg via INTRAVENOUS
  Filled 2021-03-10: qty 250

## 2021-03-10 MED ORDER — SODIUM CHLORIDE 0.9 % IV SOLN
2.0000 g | Freq: Three times a day (TID) | INTRAVENOUS | Status: DC
  Administered 2021-03-10 – 2021-03-13 (×9): 2 g via INTRAVENOUS
  Filled 2021-03-10 (×11): qty 2

## 2021-03-10 MED ORDER — SODIUM CHLORIDE 0.9% IV SOLUTION: Freq: Once | INTRAVENOUS | Status: AC

## 2021-03-10 NOTE — Progress Notes (Addendum)
Subjective: Lindsey [name] is a 50 year old female with a medical history significant for sickle cell disease, chronic pain syndrome, opiate dependence and tolerance, history of anemia of chronic disease, was admitted for sickle cell pain crisis. Today, patient continues to endorse fatigue.  Patient was febrile overnight with a max temperature of 102.1.  Patient's LDH markedly elevated.  Hemoglobin has returned to baseline at 8.8 g/dL.  Patient mildly tachycardic, heart rate 100s-110s. She denies headache, chest pain, urinary symptoms, nausea, vomiting, or diarrhea.  Patient endorses some shortness of breath. Objective:  Vital signs in last 24 hours:  Vitals:   03/10/21 0400 03/10/21 0510 03/10/21 0819 03/10/21 1036  BP:  97/66  104/63  Pulse:  (!) 102  99  Resp: (!) 23 14 16 18   Temp:  99.9 F (37.7 C)  99.1 F (37.3 C)  TempSrc:  Oral  Oral  SpO2: 93% 95% 97% 91%  Weight:      Height:        Intake/Output from previous day:   Intake/Output Summary (Last 24 hours) at 03/10/2021 1104 Last data filed at 03/10/2021 0630 Gross per 24 hour  Intake 244.5 ml  Output --  Net 244.5 ml    Physical Exam: General: Alert, awake, oriented x3, ill appearing HEENT: [place]/AT PEERL, EOMI Neck: Trachea midline,  no masses, no thyromegal,y no JVD, no carotid bruit OROPHARYNX:  Moist, No exudate/ erythema/lesions.  Heart: Regular rate and rhythm, without murmurs, rubs, gallops, PMI non-displaced, no heaves or thrills on palpation.  Lungs: Clear to auscultation, no wheezing or rhonchi noted. No increased vocal fremitus resonant to percussion  Abdomen: Soft, nontender, nondistended, positive bowel sounds, no masses no hepatosplenomegaly noted..  Neuro: No focal neurological deficits noted cranial nerves II through XII grossly intact. DTRs 2+ bilaterally upper and lower extremities. Strength 5 out of 5 in bilateral upper and lower extremities. Musculoskeletal: No warm swelling or erythema around  joints, no spinal tenderness noted. Psychiatric: Patient alert and oriented x3, tearful affect     Lab Results:  Basic Metabolic Panel:    Component Value Date/Time   NA 141 03/09/2021 0508   NA 140 01/07/2020 1127   K 4.1 03/09/2021 0508   CL 109 03/09/2021 0508   CO2 23 03/09/2021 0508   BUN 26 (H) 03/09/2021 0508   BUN 12 01/07/2020 1127   CREATININE 0.88 03/09/2021 0508   GLUCOSE 118 (H) 03/09/2021 0508   CALCIUM 8.1 (L) 03/09/2021 0508   CBC:    Component Value Date/Time   WBC 14.4 (H) 03/10/2021 0528   HGB 8.0 (L) 03/10/2021 0528   HGB 11.5 01/07/2020 1127   HCT 22.2 (L) 03/10/2021 0528   HCT 34.3 01/07/2020 1127   PLT 148 (L) 03/10/2021 0528   PLT 436 01/07/2020 1127   MCV 83.1 03/10/2021 0528   MCV 96 01/07/2020 1127   NEUTROABS 9.4 (H) 03/10/2021 0528   NEUTROABS 7.6 (H) 01/07/2020 1127   LYMPHSABS 3.1 03/10/2021 0528   LYMPHSABS 2.0 01/07/2020 1127   MONOABS 1.2 (H) 03/10/2021 0528   EOSABS 0.0 03/10/2021 0528   EOSABS 0.4 01/07/2020 1127   BASOSABS 0.0 03/10/2021 0528   BASOSABS 0.1 01/07/2020 1127    Recent Results (from the past 240 hour(s))  Resp Panel by RT-PCR (Flu A&B, Covid) Nasopharyngeal Swab     Status: None   Collection Time: 03/05/21  9:09 PM   Specimen: Nasopharyngeal Swab; Nasopharyngeal(NP) swabs in vial transport medium  Result Value Ref Range Status  SARS Coronavirus 2 by RT PCR NEGATIVE NEGATIVE Final    Comment: (NOTE) SARS-CoV-2 target nucleic acids are NOT DETECTED.  The SARS-CoV-2 RNA is generally detectable in upper respiratory specimens during the acute phase of infection. The lowest concentration of SARS-CoV-2 viral copies this assay can detect is 138 copies/mL. A negative result does not preclude SARS-Cov-2 infection and should not be used as the sole basis for treatment or other patient management decisions. A negative result may occur with  improper specimen collection/handling, submission of specimen other than  nasopharyngeal swab, presence of viral mutation(s) within the areas targeted by this assay, and inadequate number of viral copies(<138 copies/mL). A negative result must be combined with clinical observations, patient history, and epidemiological information. The expected result is Negative.  Fact Sheet for Patients:  BloggerCourse.com  Fact Sheet for Healthcare Providers:  SeriousBroker.it  This test is no t yet approved or cleared by the [place] FDA and  has been authorized for detection and/or diagnosis of SARS-CoV-2 by FDA under an Emergency Use Authorization (EUA). This EUA will remain  in effect (meaning this test can be used) for the duration of the COVID-19 declaration under Section 564(b)(1) of the Act, 21 U.S.C.section 360bbb-3(b)(1), unless the authorization is terminated  or revoked sooner.       Influenza A by PCR NEGATIVE NEGATIVE Final   Influenza B by PCR NEGATIVE NEGATIVE Final    Comment: (NOTE) The Xpert Xpress SARS-CoV-2/FLU/RSV plus assay is intended as an aid in the diagnosis of influenza from Nasopharyngeal swab specimens and should not be used as a sole basis for treatment. Nasal washings and aspirates are unacceptable for Xpert Xpress SARS-CoV-2/FLU/RSV testing.  Fact Sheet for Patients: BloggerCourse.com  Fact Sheet for Healthcare Providers: SeriousBroker.it  This test is not yet approved or cleared by the [place] FDA and has been authorized for detection and/or diagnosis of SARS-CoV-2 by FDA under an Emergency Use Authorization (EUA). This EUA will remain in effect (meaning this test can be used) for the duration of the COVID-19 declaration under Section 564(b)(1) of the Act, 21 U.S.C. section 360bbb-3(b)(1), unless the authorization is terminated or revoked.  Performed at [facility], [address] [place], [place] [address]   Culture, blood (routine x 2)     Status: None (Preliminary result)   Collection Time: 03/07/21  8:15 PM   Specimen: BLOOD  Result Value Ref Range Status   Specimen Description   Final    BLOOD BLOOD RIGHT HAND Performed at [facility], [address], [place], [place] [address]    Special Requests   Final    BOTTLES DRAWN AEROBIC ONLY Blood Culture adequate volume Performed at [facility], [address] [place], [place] [address]    Culture   Final    NO GROWTH 3 DAYS Performed at [facility], [address], [place], [place] [address]    Report Status PENDING  Incomplete  Culture, blood (routine x 2)     Status: None (Preliminary result)   Collection Time: 03/07/21 11:06 PM   Specimen: BLOOD  Result Value Ref Range Status   Specimen Description   Final    BLOOD BLOOD RIGHT HAND Performed at [facility], [address], [place], [place] [address]    Special Requests   Final    BOTTLES DRAWN AEROBIC ONLY Blood Culture adequate volume Performed at [facility], [address], [place], [place] [address]  Culture   Final    NO GROWTH 2 DAYS Performed at [facility], [address], [place], [place] [address]    Report Status PENDING  Incomplete    Studies/Results: No results found.  Medications: Scheduled Meds:  sodium chloride   Intravenous Once   acetaminophen  650 mg Oral Once   diphenhydrAMINE  25 mg Intravenous Once   gabapentin  400 mg Oral TID   HYDROmorphone   Intravenous Q4H   influenza vac split quadrivalent PF  0.5 mL Intramuscular Tomorrow-1000   ketorolac  15 mg Intravenous Q6H   oxyCODONE  10 mg Oral Q12H   senna-docusate  1 tablet Oral BID   Continuous Infusions:  sodium chloride 10 mL/hr at 03/09/21 1322   azithromycin 500 mg (03/09/21 2138)   cefTRIAXone (ROCEPHIN)  IV 2 g (03/09/21 2015)   diphenhydrAMINE     PRN  Meds:.acetaminophen, albuterol, alum & mag hydroxide-simeth, diphenhydrAMINE **OR** diphenhydrAMINE, hydrOXYzine, naloxone **AND** sodium chloride flush, ondansetron **OR** ondansetron (ZOFRAN) IV, polyethylene glycol  Consultants: None  Procedures: None  Antibiotics: IV rocephin IV azithromycin  Assessment/Plan: Principal Problem:   Sickle cell pain crisis (HCC) Active Problems:   Leukocytosis   Anemia of chronic disease   Hypoxia   Sickle cell disease with pain crisis: Continue IV Dilaudid PCA OxyContin every 12 hours Oxycodone 10 mg every 6 hours as needed for severe breakthrough pain Toradol 15 mg IV every 6 hours Monitor vital signs very closely, reevaluate pain scale regularly, and supplemental oxygen as needed  Leukocytosis: Patient febrile overnight.  Max temperature 102.1 F.  Continue IV antibiotics.  Monitor closely.  Follow blood cultures, negative thus far.  Repeat chest x-ray today.  Fever of unknown origin: Patient continues to have periodic fevers.  Blood cultures negative.  Repeat chest x-ray.  Continue IV antibiotics.  Chronic pain syndrome: Continue home medications  Anemia of chronic disease: Hemoglobin 8.0 g/dL.  LDH markedly elevated at 1017.  Therapeutic phlebotomy warranted.  Remove 500 cc.  Transfused 2 units PRBCs.  Follow CBC in AM.  Chronic pain syndrome: Continue home medications   Code Status: Full Code Family Communication: N/A [name]  APRN, MSN, FNP-C [facility] [address] [place], [place] [address] [phone]  If 5PM-8AM, please contact night-coverage.  03/10/2021, 11:04 AM  LOS: 4 days

## 2021-03-10 NOTE — Progress Notes (Signed)
V/S was stable prior to do phlebotomy therapeutic. Removed 500 ml blood follow by MD order as phlebotomy therapeutic without complications. Patient's RN notified to transfuse RBCs. HS [employer]

## 2021-03-10 NOTE — Progress Notes (Signed)
Pharmacy Antibiotic Note  Oneita Hurt [place] [name] is a 50 y.o. female admitted on 03/05/2021 with pneumonia.  Patient currently on azithromycin + ceftriaxone, antibiotics being broadened. Pharmacy has been consulted for vancomycin + cefepime dosing.  Today, 03/10/21 WBC slightly elevated SCr WNL Tmax 102.4 F  Plan: Cefepime 2 g IV q8h Vancomycin 1250 mg LD followed by 1000 mg IV q24h for estimated AUC of 458 Goal vancomycin AUC 400-550. Follow renal function. Check vancomycin levels at steady state if needed.  Order MRSA PCR  Height: 5' (152.4 cm) Weight: 61.2 kg (135 lb) IBW/kg (Calculated) : 45.5  Temp (24hrs), Avg:100 F (37.8 C), Min:98.7 F (37.1 C), Max:102.4 F (39.1 C)  Recent Labs  Lab 03/05/21 1428 03/06/21 0740 03/07/21 0544 03/07/21 2014 03/07/21 2306 03/08/21 0526 03/09/21 0508 03/10/21 0528  WBC 12.5* 12.8* 12.1*  --   --  13.6* 12.4* 14.4*  CREATININE 0.81 0.79  --   --   --   --  0.88  --   LATICACIDVEN  --   --   --  1.9 1.7  --   --   --     Estimated Creatinine Clearance: 63.2 mL/min (by C-G formula based on SCr of 0.88 mg/dL).    Allergies  Allergen Reactions   Ketamine Anxiety    Anxiety, elevated HR    Antimicrobials this admission: azithromycin 10/28 >> 11/1 ceftriaxone 10/30 >> 11/1 Cefepime 11/2 >> Vancomycin 11/2 >>  Dose adjustments this admission:  Microbiology results: 10/30 BCx: ngtd  Thank you for allowing pharmacy to be a part of this patient's care.  Cindi Carbon, PharmD 03/10/2021 6:32 PM

## 2021-03-10 NOTE — Progress Notes (Signed)
PT Cancellation Note  Patient Details Name: Lindsey [name] MRN: [id number] DOB: 06-Mar-1971   Cancelled Treatment:    Reason Eval/Treat Not Completed: Medical issues which prohibited therapy;Other (comment). Per RN, pt preparing for blood transfusion. Upon arrival to room, pt tearful reporting "nervous" about medical condition, requests ice water and to hold PT for today. Notified RN of pt's state and pt calling family to talk.    Tori Sal Spratley PT, DPT 03/10/21, 11:30 AM

## 2021-03-11 LAB — CBC
HCT: 26.7 % — ABNORMAL LOW (ref 36.0–46.0)
Hemoglobin: 9.5 g/dL — ABNORMAL LOW (ref 12.0–15.0)
MCH: 30.1 pg (ref 26.0–34.0)
MCHC: 35.6 g/dL (ref 30.0–36.0)
MCV: 84.5 fL (ref 80.0–100.0)
Platelets: 142 10*3/uL — ABNORMAL LOW (ref 150–400)
RBC: 3.16 MIL/uL — ABNORMAL LOW (ref 3.87–5.11)
RDW: 19.7 % — ABNORMAL HIGH (ref 11.5–15.5)
WBC: 13.2 10*3/uL — ABNORMAL HIGH (ref 4.0–10.5)
nRBC: 65.2 % — ABNORMAL HIGH (ref 0.0–0.2)

## 2021-03-11 LAB — BPAM RBC
Blood Product Expiration Date: 202212032359
Blood Product Expiration Date: 202212032359
ISSUE DATE / TIME: 202211021410
ISSUE DATE / TIME: 202211021553
Unit Type and Rh: 5100
Unit Type and Rh: 5100

## 2021-03-11 LAB — TYPE AND SCREEN
ABO/RH(D): A POS
Antibody Screen: NEGATIVE
Unit division: 0
Unit division: 0

## 2021-03-11 LAB — CREATININE, SERUM
Creatinine, Ser: 0.7 mg/dL (ref 0.44–1.00)
GFR, Estimated: >60 mL/min (ref >60)

## 2021-03-11 LAB — MRSA NEXT GEN BY PCR, NASAL: MRSA by PCR Next Gen: DETECTED — AB

## 2021-03-11 LAB — PARVOVIRUS B19 ANTIBODY, IGG AND IGM
Parovirus B19 IgG Abs: 3.7 index — ABNORMAL HIGH (ref 0.0–0.8)
Parovirus B19 IgM Abs: 0.1 index (ref 0.0–0.8)

## 2021-03-11 LAB — LACTATE DEHYDROGENASE: LDH: 969 U/L — ABNORMAL HIGH (ref 98–192)

## 2021-03-11 MED ORDER — HYDROMORPHONE 1 MG/ML IV SOLN
INTRAVENOUS | Status: DC
  Administered 2021-03-11: 1.5 mg via INTRAVENOUS
  Administered 2021-03-11: 2 mg via INTRAVENOUS
  Administered 2021-03-11: 0 mg via INTRAVENOUS
  Administered 2021-03-12: 2.5 mg via INTRAVENOUS
  Administered 2021-03-12: 2 mg via INTRAVENOUS
  Administered 2021-03-12: 0.5 mg via INTRAVENOUS
  Administered 2021-03-12: 2.5 mg via INTRAVENOUS
  Administered 2021-03-12: 2 mg via INTRAVENOUS
  Administered 2021-03-12: 1 mg via INTRAVENOUS
  Administered 2021-03-13: 3 mg via INTRAVENOUS
  Administered 2021-03-13: 2.5 mg via INTRAVENOUS
  Administered 2021-03-13: 30 mg via INTRAVENOUS
  Administered 2021-03-13: 5 mg via INTRAVENOUS
  Filled 2021-03-11: qty 30

## 2021-03-11 NOTE — Progress Notes (Signed)
PT Cancellation Note  Patient Details Name: Lindsey [name] MRN: [id number] DOB: Apr 02, 1971   Cancelled Treatment:    Reason Eval/Treat Not Completed: Patient declined, no reason specified. Pt reports fatigued and wanting to nap today. Pt states she has been up to restroom with nursing today and is feeling better than yesterday, politely declines PT at this time. Pt states she will get up with nursing to ambulate and to recliner later today after she rests. Will check back as schedule permits.    Tori Joao Mccurdy PT, DPT 03/11/21, 1:30 PM

## 2021-03-11 NOTE — Progress Notes (Signed)
Notified MD of Critical value at 1501.

## 2021-03-12 DIAGNOSIS — D5700 Hb-SS disease with crisis, unspecified: Principal | ICD-10-CM

## 2021-03-12 LAB — CULTURE, BLOOD (ROUTINE X 2)
Culture: NO GROWTH
Special Requests: ADEQUATE

## 2021-03-12 LAB — CBC
HCT: 25.4 % — ABNORMAL LOW (ref 36.0–46.0)
Hemoglobin: 9 g/dL — ABNORMAL LOW (ref 12.0–15.0)
MCH: 30.1 pg (ref 26.0–34.0)
MCHC: 35.4 g/dL (ref 30.0–36.0)
MCV: 84.9 fL (ref 80.0–100.0)
Platelets: 163 10*3/uL (ref 150–400)
RBC: 2.99 MIL/uL — ABNORMAL LOW (ref 3.87–5.11)
RDW: 19.9 % — ABNORMAL HIGH (ref 11.5–15.5)
WBC: 13 10*3/uL — ABNORMAL HIGH (ref 4.0–10.5)
nRBC: 73.4 % — ABNORMAL HIGH (ref 0.0–0.2)

## 2021-03-12 LAB — COMPREHENSIVE METABOLIC PANEL
ALT: 93 U/L — ABNORMAL HIGH (ref 0–44)
AST: 66 U/L — ABNORMAL HIGH (ref 15–41)
Albumin: 3.1 g/dL — ABNORMAL LOW (ref 3.5–5.0)
Alkaline Phosphatase: 149 U/L — ABNORMAL HIGH (ref 38–126)
Anion gap: 7 (ref 5–15)
BUN: 13 mg/dL (ref 6–20)
CO2: 25 mmol/L (ref 22–32)
Calcium: 8.1 mg/dL — ABNORMAL LOW (ref 8.9–10.3)
Chloride: 108 mmol/L (ref 98–111)
Creatinine, Ser: 0.67 mg/dL (ref 0.44–1.00)
GFR, Estimated: >60 mL/min (ref >60)
Glucose, Bld: 109 mg/dL — ABNORMAL HIGH (ref 70–99)
Potassium: 3.9 mmol/L (ref 3.5–5.1)
Sodium: 140 mmol/L (ref 135–145)
Total Bilirubin: 2.2 mg/dL — ABNORMAL HIGH (ref 0.3–1.2)
Total Protein: 5.9 g/dL — ABNORMAL LOW (ref 6.5–8.1)

## 2021-03-12 LAB — LACTATE DEHYDROGENASE: LDH: 747 U/L — ABNORMAL HIGH (ref 98–192)

## 2021-03-12 LAB — RETICULOCYTES
Immature Retic Fract: 15.9 % (ref 2.3–15.9)
RBC.: 2.95 MIL/uL — ABNORMAL LOW (ref 3.87–5.11)
Retic Count, Absolute: 366 10*3/uL — ABNORMAL HIGH (ref 19.0–186.0)
Retic Ct Pct: 12.4 % — ABNORMAL HIGH (ref 0.4–3.1)

## 2021-03-12 MED ORDER — SODIUM CHLORIDE 0.9% FLUSH
10.0000 mL | Freq: Two times a day (BID) | INTRAVENOUS | Status: DC
  Administered 2021-03-12 – 2021-03-13 (×2): 10 mL

## 2021-03-12 MED ORDER — SODIUM CHLORIDE 0.9% FLUSH: 10.0000 mL | INTRAVENOUS | Status: DC | PRN

## 2021-03-12 MED ORDER — CHLORHEXIDINE GLUCONATE CLOTH 2 % EX PADS
6.0000 | MEDICATED_PAD | Freq: Every day | CUTANEOUS | Status: DC
  Administered 2021-03-13: 6 via TOPICAL

## 2021-03-12 MED ORDER — VANCOMYCIN HCL 1000 MG/200ML IV SOLN
1000.0000 mg | INTRAVENOUS | Status: DC
  Administered 2021-03-12: 1000 mg via INTRAVENOUS
  Filled 2021-03-12 (×2): qty 200

## 2021-03-12 MED ORDER — MUPIROCIN 2 % EX OINT
1.0000 "application " | TOPICAL_OINTMENT | Freq: Two times a day (BID) | CUTANEOUS | Status: DC
  Administered 2021-03-12 – 2021-03-13 (×2): 1 via NASAL
  Filled 2021-03-12: qty 22

## 2021-03-12 NOTE — Progress Notes (Signed)
Discussed POC with nurse and notified both nurse and Dr. Hart Rochester that midline can only be used for MAX of 6 days and then new line will need to be placed if Vancomycin needing to be continued. Nurse VU Tomasita Morrow, RN VAST

## 2021-03-12 NOTE — TOC Initial Note (Signed)
Transition of Care [facility]) - Initial/Assessment Note    Patient Details  Name: Lindsey [name] MRN: [id number] Date of Birth: May 15, 1970  Transition of Care [facility]) CM/SW Contact:    Golda Acre, RN Phone Number: 03/12/2021, 9:47 AM  Clinical Narrative:                 50 year old female with a medical history significant for sickle cell disease, chronic pain syndrome, opiate dependence and tolerance, history of anemia of chronic disease, was admitted for sickle cell pain crisis. Today, patient continues to endorse fatigue.  Patient was febrile overnight with a max temperature of 102.1.  Patient's LDH markedly elevated.  Hemoglobin has returned to baseline at 8.8 g/dL.  Patient mildly tachycardic, heart rate 100s-110s. She denies headache, chest pain, urinary symptoms, nausea, vomiting, or diarrhea.  Patient endorses some shortness of breath. TOC PLAN OF CARE: Following for toc needs should be able to return to home with self care, following for progression. Expected Discharge Plan: Home/Self Care Barriers to Discharge: Continued Medical Work up   Patient Goals and CMS Choice Patient states their goals for this hospitalization and ongoing recovery are:: to return to my home CMS Medicare.gov Compare Post Acute Care list provided to:: Patient    Expected Discharge Plan and Services Expected Discharge Plan: Home/Self Care   Discharge Planning Services: CM Consult   Living arrangements for the past 2 months: Single Family Home                                      Prior Living Arrangements/Services Living arrangements for the past 2 months: Single Family Home Lives with:: Spouse Patient language and need for interpreter reviewed:: Yes Do you feel safe going back to the place where you live?: Yes            Criminal Activity/Legal Involvement Pertinent to Current Situation/Hospitalization: No - Comment as needed  Activities of Daily Living Home Assistive  Devices/Equipment: Contact lenses ADL Screening (condition at time of admission) Patient's cognitive ability adequate to safely complete daily activities?: Yes Is the patient deaf or have difficulty hearing?: No Does the patient have difficulty seeing, even when wearing glasses/contacts?: No Does the patient have difficulty concentrating, remembering, or making decisions?: No Patient able to express need for assistance with ADLs?: Yes Does the patient have difficulty dressing or bathing?: No Independently performs ADLs?: Yes (appropriate for developmental age) Does the patient have difficulty walking or climbing stairs?: No Weakness of Legs: Both Weakness of Arms/Hands: Both  Permission Sought/Granted                  Emotional Assessment Appearance:: Appears stated age Attitude/Demeanor/Rapport: Engaged Affect (typically observed): Calm Orientation: : Oriented to Self, Oriented to Place, Oriented to  Time, Oriented to Situation Alcohol / Substance Use: Not Applicable Psych Involvement: No (comment)  Admission diagnosis:  Sickle cell pain crisis (HCC) [D57.00] Patient Active Problem List   Diagnosis Date Noted   Hypoxia    Acute kidney injury (HCC) 01/18/2021   Cough 06/27/2020   Hypokalemia 06/27/2020   Acute pain of left shoulder    Right leg pain    Sickle cell anemia with crisis (HCC) 09/10/2019   Anemia of chronic disease 09/10/2019   Current mild episode of major depressive disorder (HCC)    Sickle cell crisis (HCC) 02/28/2019   Opioid dependence in remission (HCC) 10/01/2017  Sickle cell disease (HCC) 10/01/2017   Chronic pain 10/01/2017   Opiate abuse, episodic (HCC) 09/25/2017   Leukocytosis 09/03/2016   Sickle cell pain crisis (HCC) 07/20/2012   Sickle cell anemia with pain (HCC) 05/10/2012   PCP:  Massie Maroon, FNP Pharmacy:   [id number] - [place], [place] - [address] AT [facility] OF [address] [place] [address] Phone: [phone] Fax: [phone]  [id number] - [place], [place] - [address] AT [facility] [address] [place] [address] Phone: [phone] Fax: [phone]  [id number] [place], [place] - [address] AT [facility] [address] & [facility] [address] [place] [address] Phone: [phone] Fax: [phone]     Social Determinants of Health (SDOH) Interventions    Readmission Risk Interventions No flowsheet data found.

## 2021-03-12 NOTE — Progress Notes (Signed)
Subjective: Lindsey [name] is a 50 year old female with a medical history significant for sickle cell disease, chronic pain syndrome, opiate dependence and tolerance, history of anemia of chronic disease, was admitted for sickle cell pain crisis. Today, patient continues to endorse fatigue.  Patient was febrile overnight with a max temperature of 102.1.  Patient's LDH markedly elevated.  Hemoglobin has returned to baseline. Patient mildly tachycardic, heart rate 100s-110s. She denies headache, chest pain, urinary symptoms, nausea, vomiting, or diarrhea.  Patient endorses some shortness of breath with exertion. Lindsey [name] states that pain intensity has improved.  She is having very little pain at this time, mostly to low back and lower extremities. Objective:  Vital signs in last 24 hours:  Vitals:   03/12/21 0401 03/12/21 0447 03/12/21 0848 03/12/21 1033  BP:  120/79  (!) 128/91  Pulse:  91  92  Resp: 16 16 18 17   Temp:  99.3 F (37.4 C)  99.6 F (37.6 C)  TempSrc:  Oral  Oral  SpO2: 91% 95% 94% 96%  Weight:      Height:        Intake/Output from previous day:  No intake or output data in the 24 hours ending 03/12/21 1216   Physical Exam: General: Alert, awake, oriented x3, ill appearing HEENT: [place]/AT [place]ER[place], EOMI Neck: Trachea midline,  no masses, no thyromegal,y no JVD, no carotid bruit OROPHARYNX:  Moist, No exudate/ erythema/lesions.  Heart: Regular rate and rhythm, without murmurs, rubs, gallops, PMI non-displaced, no heaves or thrills on palpation.  Lungs: Clear to auscultation, no wheezing or rhonchi noted. No increased vocal fremitus resonant to percussion  Abdomen: Soft, nontender, nondistended, positive bowel sounds, no masses no hepatosplenomegaly noted..  Neuro: No focal neurological deficits noted cranial nerves II through XII grossly intact. DTRs 2+ bilaterally upper and lower extremities. Strength 5 out of 5 in bilateral upper and lower extremities. Musculoskeletal:  No warm swelling or erythema around joints, no spinal tenderness noted. Psychiatric: Patient alert and oriented x3, tearful affect     Lab Results:  Basic Metabolic Panel:    Component Value Date/Time   NA 140 03/12/2021 0750   NA 140 01/07/2020 1127   K 3.9 03/12/2021 0750   CL 108 03/12/2021 0750   CO2 25 03/12/2021 0750   BUN 13 03/12/2021 0750   BUN 12 01/07/2020 1127   CREATININE 0.67 03/12/2021 0750   GLUCOSE 109 (H) 03/12/2021 0750   CALCIUM 8.1 (L) 03/12/2021 0750   CBC:    Component Value Date/Time   WBC 13.0 (H) 03/12/2021 0750   HGB 9.0 (L) 03/12/2021 0750   HGB 11.5 01/07/2020 1127   HCT 25.4 (L) 03/12/2021 0750   HCT 34.3 01/07/2020 1127   PLT 163 03/12/2021 0750   PLT 436 01/07/2020 1127   MCV 84.9 03/12/2021 0750   MCV 96 01/07/2020 1127   NEUTROABS 9.4 (H) 03/10/2021 0528   NEUTROABS 7.6 (H) 01/07/2020 1127   LYMPHSABS 3.1 03/10/2021 0528   LYMPHSABS 2.0 01/07/2020 1127   MONOABS 1.2 (H) 03/10/2021 0528   EOSABS 0.0 03/10/2021 0528   EOSABS 0.4 01/07/2020 1127   BASOSABS 0.0 03/10/2021 0528   BASOSABS 0.1 01/07/2020 1127    Recent Results (from the past 240 hour(s))  Resp Panel by RT-PCR (Flu A&B, Covid) Nasopharyngeal Swab     Status: None   Collection Time: 03/05/21  9:09 PM   Specimen: Nasopharyngeal Swab; Nasopharyngeal(NP) swabs in vial transport medium  Result Value Ref Range Status  SARS Coronavirus 2 by RT PCR NEGATIVE NEGATIVE Final    Comment: (NOTE) SARS-CoV-2 target nucleic acids are NOT DETECTED.  The SARS-CoV-2 RNA is generally detectable in upper respiratory specimens during the acute phase of infection. The lowest concentration of SARS-CoV-2 viral copies this assay can detect is 138 copies/mL. A negative result does not preclude SARS-Cov-2 infection and should not be used as the sole basis for treatment or other patient management decisions. A negative result may occur with  improper specimen collection/handling, submission  of specimen other than nasopharyngeal swab, presence of viral mutation(s) within the areas targeted by this assay, and inadequate number of viral copies(<138 copies/mL). A negative result must be combined with clinical observations, patient history, and epidemiological information. The expected result is Negative.  Fact Sheet for Patients:  BloggerCourse.com  Fact Sheet for Healthcare Providers:  SeriousBroker.it  This test is no t yet approved or cleared by the [place] FDA and  has been authorized for detection and/or diagnosis of SARS-CoV-2 by FDA under an Emergency Use Authorization (EUA). This EUA will remain  in effect (meaning this test can be used) for the duration of the COVID-19 declaration under Section 564(b)(1) of the Act, 21 U.S.C.section 360bbb-3(b)(1), unless the authorization is terminated  or revoked sooner.       Influenza A by PCR NEGATIVE NEGATIVE Final   Influenza B by PCR NEGATIVE NEGATIVE Final    Comment: (NOTE) The Xpert Xpress SARS-CoV-2/FLU/RSV plus assay is intended as an aid in the diagnosis of influenza from Nasopharyngeal swab specimens and should not be used as a sole basis for treatment. Nasal washings and aspirates are unacceptable for Xpert Xpress SARS-CoV-2/FLU/RSV testing.  Fact Sheet for Patients: BloggerCourse.com  Fact Sheet for Healthcare Providers: SeriousBroker.it  This test is not yet approved or cleared by the [place] FDA and has been authorized for detection and/or diagnosis of SARS-CoV-2 by FDA under an Emergency Use Authorization (EUA). This EUA will remain in effect (meaning this test can be used) for the duration of the COVID-19 declaration under Section 564(b)(1) of the Act, 21 U.S.C. section 360bbb-3(b)(1), unless the authorization is terminated or revoked.  Performed at [facility], [address], [place], [place] [address]   Culture, blood (routine x 2)     Status: None   Collection Time: 03/07/21  8:15 PM   Specimen: BLOOD  Result Value Ref Range Status   Specimen Description   Final    BLOOD BLOOD RIGHT HAND Performed at [facility], [address], [place], [place] [address]    Special Requests   Final    BOTTLES DRAWN AEROBIC ONLY Blood Culture adequate volume Performed at [facility], [address], [place], [place] [address]    Culture   Final    NO GROWTH 5 DAYS Performed at [facility], [address], [place], [place] [address]    Report Status 03/12/2021 FINAL  Final  Culture, blood (routine x 2)     Status: None (Preliminary result)   Collection Time: 03/07/21 11:06 PM   Specimen: BLOOD  Result Value Ref Range Status   Specimen Description   Final    BLOOD BLOOD RIGHT HAND Performed at [facility], [address], [place], [place] [address]    Special Requests   Final    BOTTLES DRAWN AEROBIC ONLY Blood Culture adequate volume Performed at [facility], [address], [place], [place] [address]  Culture   Final    NO GROWTH 4 DAYS Performed at [facility], [address], [place], [place] [address]    Report Status PENDING  Incomplete  MRSA Next Gen by PCR, Nasal     Status: Abnormal   Collection Time: 03/11/21  6:15 AM   Specimen: Nasal Mucosa; Nasal Swab  Result Value Ref Range Status   MRSA by PCR Next Gen DETECTED (A) NOT DETECTED Final    Comment: RESULT CALLED TO, READ BACK BY AND VERIFIED WITH: HAMPTON,J. RN AT 1458 03/11/21 MULLINS,T (NOTE) The GeneXpert MRSA Assay (FDA approved for NASAL specimens only), is one component of a comprehensive MRSA colonization surveillance program. It is not intended to diagnose MRSA infection nor to guide or monitor treatment for MRSA infections. Test performance is not FDA approved in patients less than  44 years old. Performed at [facility], [address], [place], [place] [address]     Studies/Results: No results found.  Medications: Scheduled Meds:  gabapentin  400 mg Oral TID   HYDROmorphone   Intravenous Q4H   oxyCODONE  10 mg Oral Q12H   senna-docusate  1 tablet Oral BID   Continuous Infusions:  sodium chloride 10 mL/hr at 03/09/21 1322   ceFEPime (MAXIPIME) IV 2 g (03/12/21 5188)   diphenhydrAMINE     vancomycin 1,000 mg (03/11/21 1945)   PRN Meds:.acetaminophen, albuterol, alum & mag hydroxide-simeth, diphenhydrAMINE **OR** diphenhydrAMINE, hydrOXYzine, naloxone **AND** sodium chloride flush, ondansetron **OR** ondansetron (ZOFRAN) IV, polyethylene glycol  Consultants: None  Procedures: None  Antibiotics: IV vancomycin IV cefepime  Assessment/Plan: Principal Problem:   Sickle cell pain crisis (HCC) Active Problems:   Leukocytosis   Anemia of chronic disease   Hypoxia   Sickle cell disease with pain crisis: Continue IV Dilaudid PCA OxyContin every 12 hours Oxycodone 10 mg every 6 hours as needed for severe breakthrough pain Toradol 15 mg IV every 6 hours Monitor vital signs very closely, reevaluate pain scale regularly, and supplemental oxygen as needed  Leukocytosis: Patient febrile overnight.  Max temperature 102.1 F.  MRSA positive.  Continue IV antibiotics.  Monitor closely.  Follow blood cultures, negative thus far.    MRSA infection: Continue IV vancomycin and IV cefepime per pharmacy consult.  Patient continues to have periodic fevers.  Tylenol 650 mg every 4 hours as needed.    Chronic pain syndrome: Continue home medications  Anemia of chronic disease: Hemoglobin 9.0 g/dL.  LDH improving.  Patient is status post therapeutic phlebotomy warranted.  Parvovirus positive, possible aplastic crisis.  Platelets improving.  Continue to follow CBC, repeat in AM.  Chronic pain syndrome: Continue home medications   Code  Status: Full Code Family Communication: N/A [name]  APRN, MSN, FNP-C Patient Care Center [facility] [address] [place], [place] [address] [phone]  If 5PM-7AM, please contact night-coverage.  03/12/2021, 12:16 PM  LOS: 6 days

## 2021-03-12 NOTE — Progress Notes (Signed)
Pharmacy Antibiotic Note  Lindsey [name] is a 50 y.o. female admitted on 03/05/2021 with pneumonia.  Patient currently on azithromycin + ceftriaxone, antibiotics being broadened. Pharmacy has been consulted for vancomycin + cefepime dosing.  Today, 03/12/21 - day #3 broad spectrum abx WBC 13, elevated but improved SCr WNL, CrCl ~70 ml/min Tmax 101.5 F  Plan: Continue Cefepime 2 g IV q8h Continue Vancomycin 1000 mg IV q24h for estimated AUC of 458 Goal vancomycin AUC 400-550. Follow renal function. Check vancomycin levels at steady state if needed.   Height: 5' (152.4 cm) Weight: 61.2 kg (135 lb) IBW/kg (Calculated) : 45.5  Temp (24hrs), Avg:100.1 F (37.8 C), Min:98.3 F (36.8 C), Max:102 F (38.9 C)  Recent Labs  Lab 03/05/21 1428 03/06/21 0740 03/07/21 0544 03/07/21 2014 03/07/21 2306 03/08/21 0526 03/09/21 0508 03/10/21 0528 03/11/21 0537 03/12/21 0750  WBC 12.5* 12.8*   < >  --   --  13.6* 12.4* 14.4* 13.2* 13.0*  CREATININE 0.81 0.79  --   --   --   --  0.88  --  0.70 0.67  LATICACIDVEN  --   --   --  1.9 1.7  --   --   --   --   --    < > = values in this interval not displayed.     Estimated Creatinine Clearance: 69.6 mL/min (by C-G formula based on SCr of 0.67 mg/dL).    Allergies  Allergen Reactions   Ketamine Anxiety    Anxiety, elevated HR    Antimicrobials this admission:  11/1 CTX/Azith x 1 11/2 Vanc >> 11/2 Cefepime >>  Dose adjustments this admission:   Microbiology results:  10/30 BCx: NGTD 11/3 MRSA PCR: detected  Thank you for allowing pharmacy to be a part of this patient's care.  Loralee Pacas, PharmD, BCPS Pharmacy: [phone] 03/12/2021 11:11 AM

## 2021-03-13 LAB — CBC
HCT: 25.8 % — ABNORMAL LOW (ref 36.0–46.0)
Hemoglobin: 8.8 g/dL — ABNORMAL LOW (ref 12.0–15.0)
MCH: 29.6 pg (ref 26.0–34.0)
MCHC: 34.1 g/dL (ref 30.0–36.0)
MCV: 86.9 fL (ref 80.0–100.0)
Platelets: 146 10*3/uL — ABNORMAL LOW (ref 150–400)
RBC: 2.97 MIL/uL — ABNORMAL LOW (ref 3.87–5.11)
RDW: 20 % — ABNORMAL HIGH (ref 11.5–15.5)
WBC: 10.6 10*3/uL — ABNORMAL HIGH (ref 4.0–10.5)
nRBC: 56.4 % — ABNORMAL HIGH (ref 0.0–0.2)

## 2021-03-13 LAB — CULTURE, BLOOD (ROUTINE X 2)
Culture: NO GROWTH
Special Requests: ADEQUATE

## 2021-03-13 LAB — LACTATE DEHYDROGENASE: LDH: 675 U/L — ABNORMAL HIGH (ref 98–192)

## 2021-03-13 MED ORDER — AMOXICILLIN-POT CLAVULANATE 875-125 MG PO TABS: 1.0000 | ORAL_TABLET | Freq: Two times a day (BID) | ORAL | 0 refills | Status: AC

## 2021-03-13 NOTE — Discharge Summary (Signed)
Physician Discharge Summary  Patient ID: Lindsey [name] MRN: [id number] DOB/AGE: 07-09-1970 50 y.o.  Admit date: 03/05/2021 Discharge date: 03/13/2021  Admission Diagnoses:  Discharge Diagnoses:  Principal Problem:   Sickle cell pain crisis (HCC) Active Problems:   Leukocytosis   Anemia of chronic disease   Hypoxia   Discharged Condition: good  Hospital Course: Patient is a 50 year old female with history of sickle cell disease, chronic pain syndrome, opiate dependence and tolerance, who was admitted with sickle cell painful crisis.  Patient also was noted to have fever and leukocytosis.  Suspicion for possible pneumonia or acute chest.  No obvious source but patient found to be MRSA positive from the source swab.  She was subsequently initiated on vancomycin and cefepime empirically.  Cultures so far negative.  Patient improved.  Sickle cell pain was treated with her OxyContin with Dilaudid PCA and her oxycodone.  She hemoglobin was much better at 9 g aerobically.  She did have LDH elevation worrisome for hemolytic anemia but hemoglobin was holding on.  Parvovirus was checked which was positive.  She did have therapeutic phlebotomy.  CBC was subsequently followed and she did better.  At the time of discharge pain was down to 3 out of 10.  She was discharged home on home regimen to follow-up with PCP.  Consults: None  Significant Diagnostic Studies: labs: CBC and CMP is checked.  Mostly within normal.  MRSA swab was positive.  Blood cultures negative.  Treatments: IV hydration, antibiotics: vancomycin and cefepime, and analgesia: acetaminophen and Dilaudid  Discharge Exam: Blood pressure 134/90, pulse 86, temperature 99.7 F (37.6 C), temperature source Oral, resp. rate 20, height 5' (1.524 m), weight 61.2 kg, SpO2 96 %. General appearance: alert, cooperative, appears stated age, and no distress Neck: no adenopathy, no carotid bruit, no JVD, supple, symmetrical, trachea midline,  and thyroid not enlarged, symmetric, no tenderness/mass/nodules Back: symmetric, no curvature. ROM normal. No CVA tenderness. Resp: clear to auscultation bilaterally Cardio: regular rate and rhythm, S1, S2 normal, no murmur, click, rub or gallop GI: soft, non-tender; bowel sounds normal; no masses,  no organomegaly Extremities: extremities normal, atraumatic, no cyanosis or edema Pulses: 2+ and symmetric  Disposition: Discharge disposition: 01-Home or Self Care       Discharge Instructions     Diet - low sodium heart healthy   Complete by: As directed    Increase activity slowly   Complete by: As directed       Allergies as of 03/13/2021       Reactions   Ketamine Anxiety   Anxiety, elevated HR        Medication List     STOP taking these medications    albuterol 108 (90 Base) MCG/ACT inhaler Commonly known as: VENTOLIN HFA   ergocalciferol 1.25 MG (50000 UT) capsule Commonly known as: VITAMIN D2   ibuprofen 800 MG tablet Commonly known as: ADVIL       TAKE these medications    amoxicillin-clavulanate 875-125 MG tablet Commonly known as: Augmentin Take 1 tablet by mouth 2 (two) times daily for 14 days.   gabapentin 400 MG capsule Commonly known as: NEURONTIN Take 1 capsule (400 mg total) by mouth 3 (three) times daily.   Oxycodone HCl 10 MG Tabs Take 1 tablet (10 mg total) by mouth every 6 (six) hours as needed (pain).   oxyCODONE 10 mg 12 hr tablet Commonly known as: OXYCONTIN Take 1 tablet (10 mg total) by mouth every 12 (twelve) hours.  SignedLonia Blood 03/13/2021, 1:42 PM Time spent 34 minutes

## 2021-03-15 ENCOUNTER — Other Ambulatory Visit: Payer: Self-pay | Admitting: Family Medicine

## 2021-03-15 ENCOUNTER — Telehealth: Payer: Self-pay | Admitting: Family Medicine

## 2021-03-15 DIAGNOSIS — G894 Chronic pain syndrome: Secondary | ICD-10-CM

## 2021-03-15 MED ORDER — OXYCODONE HCL ER 10 MG PO T12A: 10.0000 mg | EXTENDED_RELEASE_TABLET | Freq: Two times a day (BID) | ORAL | 0 refills | Status: DC

## 2021-03-15 MED ORDER — OXYCODONE HCL 10 MG PO TABS: 10.0000 mg | ORAL_TABLET | Freq: Four times a day (QID) | ORAL | 0 refills | Status: DC | PRN

## 2021-03-15 NOTE — Progress Notes (Signed)
Reviewed PDMP substance reporting system prior to prescribing opiate medications. No inconsistencies noted.  Meds ordered this encounter  Medications   oxyCODONE (OXYCONTIN) 10 mg 12 hr tablet    Sig: Take 1 tablet (10 mg total) by mouth every 12 (twelve) hours.    Dispense:  60 tablet    Refill:  0    Order Specific Question:   Supervising Provider    Answer:   Quentin Angst [[id number]]   Oxycodone HCl 10 MG TABS    Sig: Take 1 tablet (10 mg total) by mouth every 6 (six) hours as needed (pain).    Dispense:  60 tablet    Refill:  0    Order Specific Question:   Supervising Provider    Answer:   Quentin Angst [[id number]]      [name]  APRN, MSN, FNP-C Patient Care [facility] [address] [place], [place] [address] [phone]

## 2021-03-15 NOTE — Telephone Encounter (Signed)
Patient requests a call from provider regarding post-discharge pain and questions [phone]

## 2021-03-16 ENCOUNTER — Telehealth: Payer: Self-pay

## 2021-03-16 NOTE — Telephone Encounter (Signed)
Transition Care Management Unsuccessful Follow-up Telephone Call  Date of discharge and from where:  03/13/2021 / [facility]   Attempts:  1st Attempt  Reason for unsuccessful TCM follow-up call:  HIPAA compliant:  Left voice message  George Ina RN,BSN,CCM RN Case Manager [facility]  [phone]

## 2021-03-19 ENCOUNTER — Telehealth: Payer: Self-pay

## 2021-03-19 NOTE — Telephone Encounter (Signed)
Pt called and said that she would like for you to call her  She said that she is having problems with her legs and walking!

## 2021-03-19 NOTE — Telephone Encounter (Signed)
Transition Care Management Unsuccessful Follow-up Telephone Call  Date of discharge and from where:  03/13/2021 / Lindsey [name]  Attempts:  2nd Attempt  Reason for unsuccessful TCM follow-up call:  Unable to leave message or reach patient.  Message states, "call cannot be completed as dialed."  Attempted call to listed contact phone number x 2.   George Ina RN,BSN,CCM RN Case Manager [facility].  [phone]

## 2021-03-22 ENCOUNTER — Telehealth: Payer: Self-pay

## 2021-03-22 NOTE — Telephone Encounter (Signed)
Pt called in and ask if you can please give her a call.

## 2021-03-23 ENCOUNTER — Telehealth: Payer: Self-pay | Admitting: Family Medicine

## 2021-03-23 NOTE — Telephone Encounter (Signed)
Returned patient's call, no answer. Left message.   [name]  APRN, MSN, FNP-C Patient Care [facility] [address] [place], [place] [address] [phone]

## 2021-03-25 ENCOUNTER — Ambulatory Visit: Payer: Medicaid Other | Admitting: Family Medicine

## 2021-03-29 ENCOUNTER — Telehealth: Payer: Self-pay

## 2021-03-29 NOTE — Telephone Encounter (Signed)
Oxycodone  °

## 2021-03-30 ENCOUNTER — Other Ambulatory Visit: Payer: Self-pay | Admitting: Family Medicine

## 2021-03-30 DIAGNOSIS — G894 Chronic pain syndrome: Secondary | ICD-10-CM

## 2021-03-30 MED ORDER — FOLIC ACID 1 MG PO TABS: 1.0000 mg | ORAL_TABLET | Freq: Every day | ORAL | 11 refills | Status: DC

## 2021-03-30 MED ORDER — OXYCODONE HCL 10 MG PO TABS: 10.0000 mg | ORAL_TABLET | Freq: Four times a day (QID) | ORAL | 0 refills | Status: DC | PRN

## 2021-03-30 NOTE — Progress Notes (Signed)
Reviewed PDMP substance reporting system prior to prescribing opiate medications. No inconsistencies noted.  Meds ordered this encounter  Medications   folic acid (FOLVITE) 1 MG tablet    Sig: Take 1 tablet (1 mg total) by mouth daily.    Dispense:  30 tablet    Refill:  11    Order Specific Question:   Supervising Provider    Answer:   Quentin Angst [[id number]]   Oxycodone HCl 10 MG TABS    Sig: Take 1 tablet (10 mg total) by mouth every 6 (six) hours as needed (pain).    Dispense:  60 tablet    Refill:  0    Order Specific Question:   Supervising Provider    Answer:   Quentin Angst [[id number]]   [name]  APRN, MSN, FNP-C Patient Care [facility] [address] [place], [place] [address] [phone]

## 2021-04-06 ENCOUNTER — Ambulatory Visit: Payer: Medicaid Other | Admitting: Family Medicine

## 2021-04-12 ENCOUNTER — Other Ambulatory Visit: Payer: Self-pay | Admitting: Family Medicine

## 2021-04-12 DIAGNOSIS — G894 Chronic pain syndrome: Secondary | ICD-10-CM

## 2021-04-12 MED ORDER — OXYCODONE HCL 10 MG PO TABS: 10.0000 mg | ORAL_TABLET | Freq: Four times a day (QID) | ORAL | 0 refills | Status: DC | PRN

## 2021-04-12 NOTE — Progress Notes (Signed)
Reviewed PDMP substance reporting system prior to prescribing opiate medications. No inconsistencies noted.  Meds ordered this encounter  Medications   Oxycodone HCl 10 MG TABS    Sig: Take 1 tablet (10 mg total) by mouth every 6 (six) hours as needed (pain).    Dispense:  60 tablet    Refill:  0    Order Specific Question:   Supervising Provider    Answer:   [name], [name]   Zykia Walla Moore Johnetta Sloniker  APRN, MSN, FNP-C Patient Care Center [facility] [address]  [place], [place] [address] [phone]  

## 2021-04-13 ENCOUNTER — Other Ambulatory Visit: Payer: Self-pay | Admitting: Family Medicine

## 2021-04-13 ENCOUNTER — Telehealth: Payer: Self-pay

## 2021-04-13 NOTE — Telephone Encounter (Signed)
Oxycodone 10mg

## 2021-04-19 ENCOUNTER — Other Ambulatory Visit: Payer: Self-pay | Admitting: Family Medicine

## 2021-04-19 DIAGNOSIS — G894 Chronic pain syndrome: Secondary | ICD-10-CM

## 2021-04-19 MED ORDER — OXYCODONE HCL ER 10 MG PO T12A: 10.0000 mg | EXTENDED_RELEASE_TABLET | Freq: Two times a day (BID) | ORAL | 0 refills | Status: DC

## 2021-04-19 NOTE — Progress Notes (Signed)
Reviewed PDMP substance reporting system prior to prescribing opiate medications. No inconsistencies noted.  Meds ordered this encounter  Medications   oxyCODONE (OXYCONTIN) 10 mg 12 hr tablet    Sig: Take 1 tablet (10 mg total) by mouth every 12 (twelve) hours.    Dispense:  60 tablet    Refill:  0    Order Specific Question:   Supervising Provider    Answer:   [name], [name]      Lindsey Cerino Moore Lenise Jr  APRN, MSN, FNP-C Patient Care Center [facility] [address]  [place], [place] [address] [phone]  

## 2021-04-22 ENCOUNTER — Other Ambulatory Visit: Payer: Self-pay

## 2021-04-22 DIAGNOSIS — D5700 Hb-SS disease with crisis, unspecified: Secondary | ICD-10-CM

## 2021-04-22 DIAGNOSIS — G894 Chronic pain syndrome: Secondary | ICD-10-CM

## 2021-04-22 MED ORDER — GABAPENTIN 400 MG PO CAPS: 400.0000 mg | ORAL_CAPSULE | Freq: Three times a day (TID) | ORAL | 0 refills | Status: DC

## 2021-04-25 ENCOUNTER — Other Ambulatory Visit: Payer: Self-pay

## 2021-04-25 ENCOUNTER — Encounter (HOSPITAL_COMMUNITY): Payer: Self-pay

## 2021-04-25 ENCOUNTER — Emergency Department (HOSPITAL_COMMUNITY)
Admission: EM | Admit: 2021-04-25 | Discharge: 2021-04-25 | Disposition: A | Discharge status: Home / Self Care | Payer: Medicaid Other | Attending: Emergency Medicine | Admitting: Emergency Medicine

## 2021-04-25 DIAGNOSIS — F17210 Nicotine dependence, cigarettes, uncomplicated: Secondary | ICD-10-CM | POA: Insufficient documentation

## 2021-04-25 DIAGNOSIS — D5700 Hb-SS disease with crisis, unspecified: Secondary | ICD-10-CM | POA: Diagnosis not present

## 2021-04-25 LAB — CBC WITH DIFFERENTIAL/PLATELET
Abs Immature Granulocytes: 0.02 10*3/uL (ref 0.00–0.07)
Basophils Absolute: 0.1 10*3/uL (ref 0.0–0.1)
Basophils Relative: 1 %
Eosinophils Absolute: 0.3 10*3/uL (ref 0.0–0.5)
Eosinophils Relative: 3 %
HCT: 30.6 % — ABNORMAL LOW (ref 36.0–46.0)
Hemoglobin: 10.7 g/dL — ABNORMAL LOW (ref 12.0–15.0)
Immature Granulocytes: 0 %
Lymphocytes Relative: 48 %
Lymphs Abs: 4 10*3/uL (ref 0.7–4.0)
MCH: 29.5 pg (ref 26.0–34.0)
MCHC: 35 g/dL (ref 30.0–36.0)
MCV: 84.3 fL (ref 80.0–100.0)
Monocytes Absolute: 0.5 10*3/uL (ref 0.1–1.0)
Monocytes Relative: 7 %
Neutro Abs: 3.3 10*3/uL (ref 1.7–7.7)
Neutrophils Relative %: 41 %
Platelets: 527 10*3/uL — ABNORMAL HIGH (ref 150–400)
RBC: 3.63 MIL/uL — ABNORMAL LOW (ref 3.87–5.11)
RDW: 16.1 % — ABNORMAL HIGH (ref 11.5–15.5)
WBC: 8.1 10*3/uL (ref 4.0–10.5)
nRBC: 0.4 % — ABNORMAL HIGH (ref 0.0–0.2)

## 2021-04-25 LAB — RETICULOCYTES
Immature Retic Fract: 27.1 % — ABNORMAL HIGH (ref 2.3–15.9)
RBC.: 3.61 MIL/uL — ABNORMAL LOW (ref 3.87–5.11)
Retic Count, Absolute: 137.5 10*3/uL (ref 19.0–186.0)
Retic Ct Pct: 3.8 % — ABNORMAL HIGH (ref 0.4–3.1)

## 2021-04-25 LAB — COMPREHENSIVE METABOLIC PANEL
ALT: 16 U/L (ref 0–44)
AST: 20 U/L (ref 15–41)
Albumin: 4.1 g/dL (ref 3.5–5.0)
Alkaline Phosphatase: 53 U/L (ref 38–126)
Anion gap: 7 (ref 5–15)
BUN: 7 mg/dL (ref 6–20)
CO2: 23 mmol/L (ref 22–32)
Calcium: 9.1 mg/dL (ref 8.9–10.3)
Chloride: 110 mmol/L (ref 98–111)
Creatinine, Ser: 0.78 mg/dL (ref 0.44–1.00)
GFR, Estimated: >60 mL/min (ref >60)
Glucose, Bld: 95 mg/dL (ref 70–99)
Potassium: 3.8 mmol/L (ref 3.5–5.1)
Sodium: 140 mmol/L (ref 135–145)
Total Bilirubin: 0.9 mg/dL (ref 0.3–1.2)
Total Protein: 7.4 g/dL (ref 6.5–8.1)

## 2021-04-25 LAB — I-STAT BETA HCG BLOOD, ED (MC, WL, AP ONLY): I-stat hCG, quantitative: <5 m[IU]/mL (ref <5)

## 2021-04-25 MED ORDER — KETOROLAC TROMETHAMINE 15 MG/ML IJ SOLN
15.0000 mg | INTRAMUSCULAR | Status: AC
  Administered 2021-04-25: 20:00:00 15 mg via INTRAVENOUS
  Filled 2021-04-25: qty 1

## 2021-04-25 MED ORDER — HYDROMORPHONE HCL 2 MG/ML IJ SOLN
2.0000 mg | INTRAMUSCULAR | Status: AC
  Administered 2021-04-25: 21:00:00 2 mg via INTRAVENOUS
  Filled 2021-04-25: qty 1

## 2021-04-25 MED ORDER — OXYCODONE HCL 5 MG PO TABS
15.0000 mg | ORAL_TABLET | Freq: Once | ORAL | Status: AC
  Administered 2021-04-25: 20:00:00 15 mg via ORAL
  Filled 2021-04-25: qty 3

## 2021-04-25 MED ORDER — HYDROMORPHONE HCL 2 MG/ML IJ SOLN
2.0000 mg | INTRAMUSCULAR | Status: AC
  Administered 2021-04-25: 20:00:00 2 mg via INTRAVENOUS
  Filled 2021-04-25: qty 1

## 2021-04-25 MED ORDER — DEXTROSE-NACL 5-0.45 % IV SOLN: INTRAVENOUS | Status: DC

## 2021-04-25 MED ORDER — DIPHENHYDRAMINE HCL 50 MG/ML IJ SOLN
25.0000 mg | Freq: Once | INTRAMUSCULAR | Status: AC
  Administered 2021-04-25: 20:00:00 25 mg via INTRAVENOUS
  Filled 2021-04-25: qty 1

## 2021-04-25 MED ORDER — ONDANSETRON HCL 4 MG/2ML IJ SOLN
4.0000 mg | INTRAMUSCULAR | Status: DC | PRN
  Administered 2021-04-25: 20:00:00 4 mg via INTRAVENOUS
  Filled 2021-04-25: qty 2

## 2021-04-25 NOTE — Discharge Instructions (Addendum)
As discussed, your evaluation today has been largely reassuring.  But, it is important that you monitor your condition carefully, and do not hesitate to return to the ED if you develop new, or concerning changes in your condition. ? ?Otherwise, please follow-up with your physician for appropriate ongoing care. ? ?

## 2021-04-25 NOTE — ED Provider Notes (Signed)
[facility]-EMERGENCY DEPT Provider Note   CSN: [id number] Arrival date & time: 04/25/21  1858     History Chief Complaint  Patient presents with   Sickle Cell Pain Crisis    Lindsey [name] is a 50 y.o. female.  HPI Patient presents with pain in her back, legs.  She notes that this is typical to prior sickle cell pain exacerbations.  Pain is sore, severe, not improved with home oxycodone.  She is tearful, but relates 1 history seemingly appropriately.  She has no chest pain, no dyspnea, no fever, no nausea, no vomiting.     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   Opioid dependence in remission (HCC)    Sickle cell crisis [facility])     Patient Active Problem List   Diagnosis Date Noted   Hypoxia    Acute kidney injury (HCC) 01/18/2021   Cough 06/27/2020   Hypokalemia 06/27/2020   Acute pain of left shoulder    Right leg pain    Sickle cell anemia with crisis (HCC) 09/10/2019   Anemia of chronic disease 09/10/2019   Current mild episode of major depressive disorder (HCC)    Sickle cell crisis (HCC) 02/28/2019   Opioid dependence in remission (HCC) 10/01/2017   Sickle cell disease (HCC) 10/01/2017   Chronic pain 10/01/2017   Opiate abuse, 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     Family History  Problem Relation Age of Onset   Stroke Neg Hx        none that she knows of    Seizures Neg Hx     Social History   Tobacco Use   Smoking status: Some Days    Packs/day: 0.00    Types: Cigars, Cigarettes   Smokeless tobacco: Never  Vaping Use   Vaping Use: Former  Substance Use Topics   Alcohol use: Yes     Comment: occasionally   Drug use: No    Home Medications Prior to Admission medications   Medication Sig Start Date End Date Taking? Authorizing Provider  folic acid (FOLVITE) 1 MG tablet Take 1 tablet (1 mg total) by mouth daily. 03/30/21 03/30/22  Massie Maroon, FNP  gabapentin (NEURONTIN) 400 MG capsule Take 1 capsule (400 mg total) by mouth 3 (three) times daily. 04/22/21   Massie Maroon, FNP  oxyCODONE (OXYCONTIN) 10 mg 12 hr tablet Take 1 tablet (10 mg total) by mouth every 12 (twelve) hours. 04/19/21   Massie Maroon, FNP  Oxycodone HCl 10 MG TABS Take 1 tablet (10 mg total) by mouth every 6 (six) hours as needed (pain). 04/13/21   Massie Maroon, FNP    Allergies    Ketamine  Review of Systems   Review of Systems  Constitutional:        Per HPI, otherwise negative  HENT:         Per HPI, otherwise negative  Respiratory:         Per HPI, otherwise negative  Cardiovascular:        Per HPI, otherwise negative  Gastrointestinal:  Negative for  vomiting.  Endocrine:       Negative aside from HPI  Genitourinary:        Neg aside from HPI   Musculoskeletal:        Per HPI, otherwise negative  Skin: Negative.   Allergic/Immunologic: Positive for immunocompromised state.  Neurological:  Negative for syncope.   Physical Exam Updated Vital Signs BP 107/67    Pulse 63    Temp 98 F (36.7 C) (Oral)    Resp 16    Ht 5' (1.524 m)    Wt 61.2 kg    SpO2 99%    BMI 26.37 kg/m   Physical Exam Vitals and nursing note reviewed.  Constitutional:      General: She is not in acute distress.    Appearance: She is well-developed.  HENT:     Head: Normocephalic and atraumatic.  Eyes:     Conjunctiva/sclera: Conjunctivae normal.  Cardiovascular:     Rate and Rhythm: Normal rate and regular rhythm.  Pulmonary:     Effort: Pulmonary effort is normal. No respiratory distress.     Breath sounds: Normal breath sounds. No stridor.  Abdominal:     General: There is no  distension.  Skin:    General: Skin is warm and dry.  Neurological:     Mental Status: She is alert and oriented to person, place, and time.     Cranial Nerves: No cranial nerve deficit.    ED Results / Procedures / Treatments   Labs (all labs ordered are listed, but only abnormal results are displayed) Labs Reviewed  CBC WITH DIFFERENTIAL/PLATELET - Abnormal; Notable for the following components:      Result Value   RBC 3.63 (*)    Hemoglobin 10.7 (*)    HCT 30.6 (*)    RDW 16.1 (*)    Platelets 527 (*)    nRBC 0.4 (*)    All other components within normal limits  RETICULOCYTES - Abnormal; Notable for the following components:   Retic Ct Pct 3.8 (*)    RBC. 3.61 (*)    Immature Retic Fract 27.1 (*)    All other components within normal limits  COMPREHENSIVE METABOLIC PANEL  I-STAT BETA HCG BLOOD, ED ([facility], [facility], [facility] ONLY)    EKG None  Radiology No results found.  Procedures Procedures   Medications Ordered in ED Medications  dextrose 5 %-0.45 % sodium chloride infusion ( Intravenous New Bag/Given 04/25/21 2010)  ondansetron (ZOFRAN) injection 4 mg (4 mg Intravenous Given 04/25/21 2010)  ketorolac (TORADOL) 15 MG/ML injection 15 mg (15 mg Intravenous Given 04/25/21 2011)  HYDROmorphone (DILAUDID) injection 2 mg (2 mg Intravenous Given 04/25/21 2057)  HYDROmorphone (DILAUDID) injection 2 mg (2 mg Intravenous Given 04/25/21 2010)  oxyCODONE (Oxy IR/ROXICODONE) immediate release tablet 15 mg (15 mg Oral Given 04/25/21 2011)  diphenhydrAMINE (BENADRYL) injection 25 mg (25 mg Intravenous Given 04/25/21 2010)    ED Course  I have reviewed the triage vital signs and the nursing notes.  Pertinent labs & imaging results that were available during my care of the patient were reviewed by me and considered in my medical decision making (see chart for details).   9:52 PM Patient awake, alert, feeling much better, sitting upright, speaking clearly.  We discussed all findings  which have been generally reassuring.  Appropriate reticulocyte count, no fever, resolution of pain all reassuring, low suspicion for other acute new phenomena or decompensated sickle cell disease.  Patient discharged with outpatient follow-up.Final Clinical  Impression(s) / ED Diagnoses Final diagnoses:  Sickle cell pain crisis [facility])       Gerhard Munch, MD 04/25/21 2153

## 2021-04-25 NOTE — ED Triage Notes (Signed)
Pt reports with SCC x 2 days. Pt states that her pain is located in her lower back and right leg. Last pain medication taken at 4 pm.

## 2021-04-26 ENCOUNTER — Other Ambulatory Visit: Payer: Self-pay | Admitting: Internal Medicine

## 2021-04-26 DIAGNOSIS — G894 Chronic pain syndrome: Secondary | ICD-10-CM

## 2021-04-26 MED ORDER — OXYCODONE HCL 10 MG PO TABS: 10.0000 mg | ORAL_TABLET | Freq: Four times a day (QID) | ORAL | 0 refills | Status: DC | PRN

## 2021-04-27 ENCOUNTER — Telehealth: Payer: Self-pay

## 2021-04-27 NOTE — Telephone Encounter (Signed)
Oxycodone 10mg

## 2021-04-29 ENCOUNTER — Other Ambulatory Visit: Payer: Self-pay | Admitting: Internal Medicine

## 2021-05-09 ENCOUNTER — Inpatient Hospital Stay (HOSPITAL_COMMUNITY)
Admission: EM | Admit: 2021-05-09 | Discharge: 2021-05-11 | DRG: 812 | Disposition: A | Discharge status: Home / Self Care | Payer: Medicaid Other | Attending: Internal Medicine | Admitting: Internal Medicine

## 2021-05-09 ENCOUNTER — Emergency Department (HOSPITAL_COMMUNITY): Payer: Medicaid Other

## 2021-05-09 ENCOUNTER — Other Ambulatory Visit: Payer: Self-pay

## 2021-05-09 ENCOUNTER — Encounter (HOSPITAL_COMMUNITY): Payer: Self-pay

## 2021-05-09 DIAGNOSIS — Z20822 Contact with and (suspected) exposure to covid-19: Secondary | ICD-10-CM | POA: Diagnosis present

## 2021-05-09 DIAGNOSIS — F1121 Opioid dependence, in remission: Secondary | ICD-10-CM | POA: Diagnosis present

## 2021-05-09 DIAGNOSIS — D72829 Elevated white blood cell count, unspecified: Secondary | ICD-10-CM | POA: Diagnosis not present

## 2021-05-09 DIAGNOSIS — G894 Chronic pain syndrome: Secondary | ICD-10-CM | POA: Diagnosis present

## 2021-05-09 DIAGNOSIS — D638 Anemia in other chronic diseases classified elsewhere: Secondary | ICD-10-CM | POA: Diagnosis present

## 2021-05-09 DIAGNOSIS — Z87891 Personal history of nicotine dependence: Secondary | ICD-10-CM

## 2021-05-09 DIAGNOSIS — Z888 Allergy status to other drugs, medicaments and biological substances status: Secondary | ICD-10-CM

## 2021-05-09 DIAGNOSIS — D5700 Hb-SS disease with crisis, unspecified: Secondary | ICD-10-CM | POA: Diagnosis not present

## 2021-05-09 DIAGNOSIS — G8929 Other chronic pain: Secondary | ICD-10-CM | POA: Diagnosis present

## 2021-05-09 DIAGNOSIS — D571 Sickle-cell disease without crisis: Secondary | ICD-10-CM | POA: Diagnosis present

## 2021-05-09 DIAGNOSIS — Z79899 Other long term (current) drug therapy: Secondary | ICD-10-CM | POA: Diagnosis not present

## 2021-05-09 LAB — CBC WITH DIFFERENTIAL/PLATELET
Abs Immature Granulocytes: 0.03 10*3/uL (ref 0.00–0.07)
Basophils Absolute: 0.1 10*3/uL (ref 0.0–0.1)
Basophils Relative: 1 %
Eosinophils Absolute: 0.4 10*3/uL (ref 0.0–0.5)
Eosinophils Relative: 3 %
HCT: 31.6 % — ABNORMAL LOW (ref 36.0–46.0)
Hemoglobin: 11.4 g/dL — ABNORMAL LOW (ref 12.0–15.0)
Immature Granulocytes: 0 %
Lymphocytes Relative: 45 %
Lymphs Abs: 5.6 10*3/uL — ABNORMAL HIGH (ref 0.7–4.0)
MCH: 30.7 pg (ref 26.0–34.0)
MCHC: 36.1 g/dL — ABNORMAL HIGH (ref 30.0–36.0)
MCV: 85.2 fL (ref 80.0–100.0)
Monocytes Absolute: 0.7 10*3/uL (ref 0.1–1.0)
Monocytes Relative: 6 %
Neutro Abs: 5.7 10*3/uL (ref 1.7–7.7)
Neutrophils Relative %: 45 %
Platelets: 344 10*3/uL (ref 150–400)
RBC: 3.71 MIL/uL — ABNORMAL LOW (ref 3.87–5.11)
RDW: 15.9 % — ABNORMAL HIGH (ref 11.5–15.5)
WBC: 12.5 10*3/uL — ABNORMAL HIGH (ref 4.0–10.5)
nRBC: 0.3 % — ABNORMAL HIGH (ref 0.0–0.2)

## 2021-05-09 LAB — BASIC METABOLIC PANEL
Anion gap: 8 (ref 5–15)
BUN: 11 mg/dL (ref 6–20)
CO2: 18 mmol/L — ABNORMAL LOW (ref 22–32)
Calcium: 9 mg/dL (ref 8.9–10.3)
Chloride: 111 mmol/L (ref 98–111)
Creatinine, Ser: 0.78 mg/dL (ref 0.44–1.00)
GFR, Estimated: >60 mL/min (ref >60)
Glucose, Bld: 108 mg/dL — ABNORMAL HIGH (ref 70–99)
Potassium: 4.1 mmol/L (ref 3.5–5.1)
Sodium: 137 mmol/L (ref 135–145)

## 2021-05-09 LAB — RETICULOCYTES
Immature Retic Fract: 35.7 % — ABNORMAL HIGH (ref 2.3–15.9)
RBC.: 3.63 MIL/uL — ABNORMAL LOW (ref 3.87–5.11)
Retic Count, Absolute: 81.3 10*3/uL (ref 19.0–186.0)
Retic Ct Pct: 2.2 % (ref 0.4–3.1)

## 2021-05-09 LAB — I-STAT BETA HCG BLOOD, ED (MC, WL, AP ONLY): I-stat hCG, quantitative: <5 m[IU]/mL (ref <5)

## 2021-05-09 LAB — RESP PANEL BY RT-PCR (FLU A&B, COVID) ARPGX2
Influenza A by PCR: NEGATIVE
Influenza B by PCR: NEGATIVE
SARS Coronavirus 2 by RT PCR: NEGATIVE

## 2021-05-09 MED ORDER — HYDROMORPHONE HCL 2 MG/ML IJ SOLN
2.0000 mg | Freq: Once | INTRAMUSCULAR | Status: AC
  Administered 2021-05-09: 2 mg via INTRAVENOUS
  Filled 2021-05-09: qty 1

## 2021-05-09 MED ORDER — HYDROMORPHONE HCL 2 MG/ML IJ SOLN
2.0000 mg | INTRAMUSCULAR | Status: AC
  Administered 2021-05-09: 2 mg via INTRAVENOUS
  Filled 2021-05-09: qty 1

## 2021-05-09 MED ORDER — KETOROLAC TROMETHAMINE 15 MG/ML IJ SOLN
15.0000 mg | INTRAMUSCULAR | Status: AC
  Administered 2021-05-09: 15 mg via INTRAVENOUS
  Filled 2021-05-09: qty 1

## 2021-05-09 MED ORDER — DIPHENHYDRAMINE HCL 25 MG PO CAPS
25.0000 mg | ORAL_CAPSULE | ORAL | Status: DC | PRN
  Administered 2021-05-09: 21:00:00 50 mg via ORAL
  Filled 2021-05-09: qty 2

## 2021-05-09 NOTE — ED Provider Notes (Signed)
Emergency Medicine Provider Triage Evaluation Note  Lindsey [name] , a 51 y.o. female  was evaluated in triage.  Pt complains of sickle cell crisis in her left shoulder and left leg, home meds are not helping.  Going on for the last few days.  States last sickle cell crisis was in October.  Denies chest pain or shortness of breath..  Was in the ED on 12/18 for sickle cell pain  Review of Systems  Positive: Sickle cell crisis Negative: Chest pain, shortness of breath, palpitations  Physical Exam  BP 120/85 (BP Location: Left Arm)    Pulse (!) 120    Temp 98.7 F (37.1 C) (Oral)    Resp (!) 22    SpO2 100%  Gen:   Awake, tearful, obviously uncomfortable Resp:  Normal effort  MSK:   Moves extremities without difficulty  Other:  Tachycardic with regular rhythm.  Tachypneic, crying.  Lungs CTA B.  Medical Decision Making  Medically screening exam initiated at 5:26 PM.  Appropriate orders placed.  Lindsey [name] was informed that the remainder of the evaluation will be completed by another provider, this initial triage assessment does not replace that evaluation, and the importance of remaining in the ED until their evaluation is complete.  Work-up ordered.This chart was dictated using voice recognition software, Dragon. Despite the best efforts of this provider to proofread and correct errors, errors may still occur which can change documentation meaning.    Emeline Darling, PA-C 05/09/21 1727    Daleen Bo, MD 05/09/21 [phone]

## 2021-05-09 NOTE — ED Provider Notes (Signed)
[facility]-EMERGENCY DEPT Provider Note   CSN: [id number] Arrival date & time: 05/09/21  1712     History  Chief Complaint  Patient presents with   Sickle Cell Pain Crisis    Lindsey [name] is a 51 y.o. female.  HPI 51 year old female with a history of sickle cell presents with a sickle cell pain crisis.  She states she is having severe pain in her right leg and left shoulder.  She has been taking her home meds such as oxycodone, gabapentin without relief.  This is similar type pain to what she has had before with sickle cell pain crises.  She denies any fevers, vomiting, chest or back pain or shortness of breath.  She has a mild to moderate headache that she thinks is from crying from being in so much pain.  She denies vomiting.  Pain is currently severe.  Onset was about 2 or 3 days ago.  Home Medications Prior to Admission medications   Medication Sig Start Date End Date Taking? Authorizing Provider  folic acid (FOLVITE) 1 MG tablet Take 1 tablet (1 mg total) by mouth daily. 03/30/21 03/30/22 Yes Massie Maroon, FNP  gabapentin (NEURONTIN) 400 MG capsule Take 1 capsule (400 mg total) by mouth 3 (three) times daily. 04/22/21  Yes Massie Maroon, FNP  oxyCODONE (OXYCONTIN) 10 mg 12 hr tablet Take 1 tablet (10 mg total) by mouth every 12 (twelve) hours. 04/19/21  Yes Massie Maroon, FNP  Oxycodone HCl 10 MG TABS Take 1 tablet (10 mg total) by mouth every 6 (six) hours as needed for up to 15 days (pain). 04/27/21 05/12/21 Yes Quentin Angst, MD      Allergies    Ketamine    Review of Systems   Review of Systems  Constitutional:  Negative for fever.  Respiratory:  Negative for shortness of breath.   Cardiovascular:  Negative for chest pain.  Gastrointestinal:  Negative for abdominal pain and vomiting.  Musculoskeletal:  Positive for arthralgias.  Neurological:  Positive for headaches.   Physical Exam Updated Vital Signs BP 94/74    Pulse  86    Temp 98.7 F (37.1 C) (Oral)    Resp 12    LMP 04/18/2021    SpO2 95%  Physical Exam Vitals and nursing note reviewed.  Constitutional:      General: She is in acute distress (crying, in pain).     Appearance: She is well-developed. She is not diaphoretic.  HENT:     Head: Normocephalic and atraumatic.     Right Ear: External ear normal.     Left Ear: External ear normal.     Nose: Nose normal.  Cardiovascular:     Rate and Rhythm: Regular rhythm. Tachycardia present.     Heart sounds: Normal heart sounds.  Pulmonary:     Effort: Pulmonary effort is normal.     Breath sounds: Normal breath sounds.  Abdominal:     General: There is no distension.     Palpations: Abdomen is soft.     Tenderness: There is no abdominal tenderness.  Musculoskeletal:     Comments: No tenderness or swelling to left shoulder or bilateral lower legs  Skin:    General: Skin is warm and dry.  Neurological:     Mental Status: She is alert.    ED Results / Procedures / Treatments   Labs (all labs ordered are listed, but only abnormal results are displayed) Labs Reviewed  CBC  WITH DIFFERENTIAL/PLATELET - Abnormal; Notable for the following components:      Result Value   WBC 12.5 (*)    RBC 3.71 (*)    Hemoglobin 11.4 (*)    HCT 31.6 (*)    MCHC 36.1 (*)    RDW 15.9 (*)    nRBC 0.3 (*)    Lymphs Abs 5.6 (*)    All other components within normal limits  BASIC METABOLIC PANEL - Abnormal; Notable for the following components:   CO2 18 (*)    Glucose, Bld 108 (*)    All other components within normal limits  RETICULOCYTES - Abnormal; Notable for the following components:   RBC. 3.63 (*)    Immature Retic Fract 35.7 (*)    All other components within normal limits  RESP PANEL BY RT-PCR (FLU A&B, COVID) ARPGX2  I-STAT BETA HCG BLOOD, ED ([facility], [facility], [facility] ONLY)    EKG None  Radiology DG Chest 1 View  Result Date: 05/09/2021 CLINICAL DATA:  Sickle cell crisis. Pain in the left shoulder and  lower leg. EXAM: CHEST  1 VIEW COMPARISON:  Two-view chest x-ray 03/10/2021 FINDINGS: Heart size is normal. Chronic interstitial coarsening is present in the lungs without underlying disease. Sclerotic changes in the humeral heads noted, left greater than right. Chronic bone changes in the thoracolumbar spine without acute abnormality. IMPRESSION: 1. No acute cardiopulmonary disease. 2. Sclerotic changes of the humeral heads, left greater than right. No acute osseous abnormality. Electronically Signed   By: Marin Roberts M.D.   On: 05/09/2021 18:10    Procedures Procedures    Medications Ordered in ED Medications  diphenhydrAMINE (BENADRYL) capsule 25-50 mg (50 mg Oral Given 05/09/21 2045)  ketorolac (TORADOL) 15 MG/ML injection 15 mg (15 mg Intravenous Given 05/09/21 2052)  HYDROmorphone (DILAUDID) injection 2 mg (2 mg Intravenous Given 05/09/21 2052)  HYDROmorphone (DILAUDID) injection 2 mg (2 mg Intravenous Given 05/09/21 2125)  HYDROmorphone (DILAUDID) injection 2 mg (2 mg Intravenous Given 05/09/21 2151)    ED Course/ Medical Decision Making/ A&P                           Medical Decision Making  I have reviewed most recent discharge summary.  This time she is not having a fever or acute illness.  She was tachycardic on arrival but I suspect this was pain related as it has now resolved with IV treatment of pain.  She has received Dilaudid, ketorolac and oral Benadryl.  Labs seem near baseline though her hemoglobin is a little increased.  She does have a leukocytosis but this seems chronic compared to baseline.  Otherwise she has a benign exam.  I do not suspect infection.  I think this is from sickle cell pain crisis/chronic pain.  Discussed with Dr. Leafy Half for admission given we are unable to get her pain adequately controlled tonight.        Final Clinical Impression(s) / ED Diagnoses Final diagnoses:  Sickle cell pain crisis [facility])    Rx / DC Orders ED Discharge Orders      None         Pricilla Loveless, MD 05/09/21 2258

## 2021-05-09 NOTE — ED Triage Notes (Signed)
Pt states she is having a sickle cell pain crisis. Pt states pain is in her legs. Pt states home meds are not helping her pain.

## 2021-05-10 ENCOUNTER — Encounter (HOSPITAL_COMMUNITY): Payer: Self-pay | Admitting: Internal Medicine

## 2021-05-10 DIAGNOSIS — D5700 Hb-SS disease with crisis, unspecified: Principal | ICD-10-CM

## 2021-05-10 DIAGNOSIS — D72829 Elevated white blood cell count, unspecified: Secondary | ICD-10-CM

## 2021-05-10 LAB — CBC WITH DIFFERENTIAL/PLATELET
Abs Immature Granulocytes: 0.03 10*3/uL (ref 0.00–0.07)
Basophils Absolute: 0.1 10*3/uL (ref 0.0–0.1)
Basophils Relative: 1 %
Eosinophils Absolute: 0.4 10*3/uL (ref 0.0–0.5)
Eosinophils Relative: 4 %
HCT: 26.6 % — ABNORMAL LOW (ref 36.0–46.0)
Hemoglobin: 9.7 g/dL — ABNORMAL LOW (ref 12.0–15.0)
Immature Granulocytes: 0 %
Lymphocytes Relative: 43 %
Lymphs Abs: 3.9 10*3/uL (ref 0.7–4.0)
MCH: 30.7 pg (ref 26.0–34.0)
MCHC: 36.5 g/dL — ABNORMAL HIGH (ref 30.0–36.0)
MCV: 84.2 fL (ref 80.0–100.0)
Monocytes Absolute: 0.8 10*3/uL (ref 0.1–1.0)
Monocytes Relative: 9 %
Neutro Abs: 4 10*3/uL (ref 1.7–7.7)
Neutrophils Relative %: 43 %
Platelets: 372 10*3/uL (ref 150–400)
RBC: 3.16 MIL/uL — ABNORMAL LOW (ref 3.87–5.11)
RDW: 15.8 % — ABNORMAL HIGH (ref 11.5–15.5)
WBC: 9.1 10*3/uL (ref 4.0–10.5)
nRBC: 0.2 % (ref 0.0–0.2)

## 2021-05-10 LAB — URINALYSIS, COMPLETE (UACMP) WITH MICROSCOPIC
Bilirubin Urine: NEGATIVE
Glucose, UA: NEGATIVE mg/dL
Hgb urine dipstick: NEGATIVE
Ketones, ur: NEGATIVE mg/dL
Nitrite: NEGATIVE
Protein, ur: NEGATIVE mg/dL
Specific Gravity, Urine: 1.012 (ref 1.005–1.030)
pH: 5 (ref 5.0–8.0)

## 2021-05-10 LAB — COMPREHENSIVE METABOLIC PANEL
ALT: 14 U/L (ref 0–44)
AST: 18 U/L (ref 15–41)
Albumin: 3.8 g/dL (ref 3.5–5.0)
Alkaline Phosphatase: 46 U/L (ref 38–126)
Anion gap: 6 (ref 5–15)
BUN: 15 mg/dL (ref 6–20)
CO2: 23 mmol/L (ref 22–32)
Calcium: 8.6 mg/dL — ABNORMAL LOW (ref 8.9–10.3)
Chloride: 108 mmol/L (ref 98–111)
Creatinine, Ser: 1 mg/dL (ref 0.44–1.00)
GFR, Estimated: >60 mL/min (ref >60)
Glucose, Bld: 100 mg/dL — ABNORMAL HIGH (ref 70–99)
Potassium: 4 mmol/L (ref 3.5–5.1)
Sodium: 137 mmol/L (ref 135–145)
Total Bilirubin: 1.4 mg/dL — ABNORMAL HIGH (ref 0.3–1.2)
Total Protein: 6.6 g/dL (ref 6.5–8.1)

## 2021-05-10 LAB — RETICULOCYTES
Immature Retic Fract: 36.9 % — ABNORMAL HIGH (ref 2.3–15.9)
RBC.: 3.14 MIL/uL — ABNORMAL LOW (ref 3.87–5.11)
Retic Count, Absolute: 66.6 10*3/uL (ref 19.0–186.0)
Retic Ct Pct: 2.1 % (ref 0.4–3.1)

## 2021-05-10 LAB — HIV ANTIBODY (ROUTINE TESTING W REFLEX): HIV Screen 4th Generation wRfx: NONREACTIVE

## 2021-05-10 LAB — MAGNESIUM: Magnesium: 2.1 mg/dL (ref 1.7–2.4)

## 2021-05-10 MED ORDER — ACETAMINOPHEN 650 MG RE SUPP: 650.0000 mg | Freq: Four times a day (QID) | RECTAL | Status: DC | PRN

## 2021-05-10 MED ORDER — DIPHENHYDRAMINE HCL 25 MG PO CAPS: 25.0000 mg | ORAL_CAPSULE | ORAL | Status: DC | PRN

## 2021-05-10 MED ORDER — ACETAMINOPHEN 325 MG PO TABS: 650.0000 mg | ORAL_TABLET | Freq: Four times a day (QID) | ORAL | Status: DC | PRN

## 2021-05-10 MED ORDER — HYDROMORPHONE HCL 2 MG/ML IJ SOLN
2.0000 mg | INTRAMUSCULAR | Status: AC
  Administered 2021-05-10: 2 mg via INTRAVENOUS
  Filled 2021-05-10: qty 1

## 2021-05-10 MED ORDER — GABAPENTIN 400 MG PO CAPS
400.0000 mg | ORAL_CAPSULE | Freq: Three times a day (TID) | ORAL | Status: DC
  Administered 2021-05-10 – 2021-05-11 (×3): 400 mg via ORAL
  Filled 2021-05-10 (×4): qty 1

## 2021-05-10 MED ORDER — POTASSIUM CHLORIDE IN NACL 20-0.45 MEQ/L-% IV SOLN
INTRAVENOUS | Status: DC
  Administered 2021-05-10: 1000 mL via INTRAVENOUS
  Filled 2021-05-10 (×2): qty 1000

## 2021-05-10 MED ORDER — ONDANSETRON HCL 4 MG/2ML IJ SOLN: 4.0000 mg | Freq: Four times a day (QID) | INTRAMUSCULAR | Status: DC | PRN

## 2021-05-10 MED ORDER — HYDROMORPHONE 1 MG/ML IV SOLN: INTRAVENOUS | Status: DC

## 2021-05-10 MED ORDER — ONDANSETRON HCL 4 MG PO TABS: 4.0000 mg | ORAL_TABLET | Freq: Four times a day (QID) | ORAL | Status: DC | PRN

## 2021-05-10 MED ORDER — ENOXAPARIN SODIUM 40 MG/0.4ML IJ SOSY
40.0000 mg | PREFILLED_SYRINGE | INTRAMUSCULAR | Status: DC
  Administered 2021-05-10 – 2021-05-11 (×2): 40 mg via SUBCUTANEOUS
  Filled 2021-05-10 (×2): qty 0.4

## 2021-05-10 MED ORDER — KETOROLAC TROMETHAMINE 30 MG/ML IJ SOLN
30.0000 mg | Freq: Four times a day (QID) | INTRAMUSCULAR | Status: DC | PRN
  Administered 2021-05-10 – 2021-05-11 (×2): 30 mg via INTRAVENOUS
  Filled 2021-05-10 (×2): qty 1

## 2021-05-10 MED ORDER — DIPHENHYDRAMINE HCL 25 MG PO CAPS
25.0000 mg | ORAL_CAPSULE | ORAL | Status: DC | PRN
  Administered 2021-05-10 – 2021-05-11 (×2): 25 mg via ORAL
  Filled 2021-05-10 (×2): qty 1

## 2021-05-10 MED ORDER — ENSURE ENLIVE PO LIQD: 237.0000 mL | Freq: Two times a day (BID) | ORAL | Status: DC

## 2021-05-10 MED ORDER — POLYETHYLENE GLYCOL 3350 17 G PO PACK: 17.0000 g | PACK | Freq: Every day | ORAL | Status: DC | PRN

## 2021-05-10 MED ORDER — FOLIC ACID 1 MG PO TABS
1.0000 mg | ORAL_TABLET | Freq: Every day | ORAL | Status: DC
  Administered 2021-05-10 – 2021-05-11 (×2): 1 mg via ORAL
  Filled 2021-05-10 (×2): qty 1

## 2021-05-10 MED ORDER — SODIUM CHLORIDE 0.9% FLUSH: 9.0000 mL | INTRAVENOUS | Status: DC | PRN

## 2021-05-10 MED ORDER — SODIUM CHLORIDE 0.9 % IV SOLN
25.0000 mg | INTRAVENOUS | Status: DC | PRN
  Filled 2021-05-10: qty 0.5

## 2021-05-10 MED ORDER — SODIUM CHLORIDE 0.9 % IV SOLN: 25.0000 mg | INTRAVENOUS | Status: DC | PRN

## 2021-05-10 MED ORDER — NALOXONE HCL 0.4 MG/ML IJ SOLN: 0.4000 mg | INTRAMUSCULAR | Status: DC | PRN

## 2021-05-10 MED ORDER — HYDROMORPHONE 1 MG/ML IV SOLN
INTRAVENOUS | Status: DC
  Administered 2021-05-10: 4.5 mg via INTRAVENOUS
  Administered 2021-05-10: 2.5 mg via INTRAVENOUS
  Administered 2021-05-10: 4 mg via INTRAVENOUS
  Administered 2021-05-10: 30 mg via INTRAVENOUS
  Administered 2021-05-10: 2 mg via INTRAVENOUS
  Administered 2021-05-10 – 2021-05-11 (×2): 3 mg via INTRAVENOUS
  Administered 2021-05-11: 6 mg via INTRAVENOUS
  Filled 2021-05-10: qty 30

## 2021-05-10 MED ORDER — PHENOL 1.4 % MT LIQD
1.0000 | OROMUCOSAL | Status: DC | PRN
  Filled 2021-05-10: qty 177

## 2021-05-10 NOTE — Assessment & Plan Note (Signed)
·   Patient presenting with bilateral shoulder and leg pain consistent with her usual sickle cell pain crisis  No clinical evidence of acute chest syndrome  Etiology of the sickle cell pain crisis is unclear although she reports that her son was recently ill with a upper respiratory tract infection  Chest x-ray unremarkable.  COVID-19 and influenza PCR testing negative.  Urinalysis pending.  Patient patient on intravenous volume resuscitation with half-normal saline  Treating patient with opiate-based analgesics via PCA pump with similar settings to what patient received during her last hospitalization

## 2021-05-10 NOTE — H&P (Signed)
History and Physical    Lindsey [name] OIZ:[id number] DOB: 06-12-1970 DOA: 05/09/2021  PCP: Massie Maroon, FNP  Patient coming from: Home   Chief Complaint:  Chief Complaint  Patient presents with   Sickle Cell Pain Crisis     HPI:    51 year old female with past medical history of sickle cell anemia presenting to [facility] emergency department with complaints of bilateral shoulder and leg pain.  Patient explains that 2 days ago patient began to develop bilateral shoulder as well as bilateral leg pain.  Pain was sharp in quality and rapidly became severe in intensity.  Pain waxes and wanes without any alleviating or exacerbating factors.  Patient states that this distribution of pain is consistent with her sickle cell pain crises.  Upon further questioning patient denies fevers, vomiting, abdominal pain, dysuria, shortness of breath, cough, recent travel, sick contacts or contact with confirmed COVID-19 infection.  Patient's pain became so severe the patient was represented to [facility] emergency department for evaluation.  Upon evaluation in the emergency department patient was found to have clinical signs and symptoms thought to be consistent with acute sickle cell crisis.  Patient was initiated on intravenous fluids and given several doses of opiate-based analgesics without relief.  Due to persisting sickle cell pain crisis hospitalist group was then called to assess the patient for admission to the hospital.  Review of Systems:   Review of Systems  Musculoskeletal:  Positive for joint pain and myalgias.       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 She has never used smokeless tobacco. She reports current alcohol use. She reports that she does not use drugs.  Allergies  Allergen Reactions   Ketamine Anxiety    Anxiety, elevated HR    Family History  Problem Relation Age of Onset   Stroke Neg Hx        none that she knows of    Seizures Neg Hx      Prior to Admission medications   Medication Sig Start Date End Date Taking? Authorizing Provider  folic acid (FOLVITE) 1 MG tablet Take 1 tablet (1 mg total) by mouth daily. 03/30/21 03/30/22 Yes Massie Maroon, FNP  gabapentin (NEURONTIN) 400 MG capsule Take 1 capsule (400 mg total) by mouth 3 (three) times daily. 04/22/21  Yes Massie Maroon, FNP  oxyCODONE (OXYCONTIN) 10 mg 12 hr tablet Take 1 tablet (10 mg total) by mouth every 12 (twelve) hours. 04/19/21  Yes Massie Maroon, FNP  Oxycodone HCl 10 MG TABS Take 1 tablet (10 mg total) by mouth every 6 (six) hours as needed for up to 15 days (pain). 04/27/21 05/12/21 Yes Quentin Angst, MD    Physical Exam: Vitals:   05/09/21 2215 05/09/21 2230 05/09/21 2300 05/09/21 2348  BP:  94/74  105/69  Pulse: 79 86  80  Resp: 11 12  14   Temp:    98.2 F (36.8 C)  TempSrc:    Oral  SpO2: 98% 95%  97%  Weight:   59.7  kg   Height:   5' (1.524 m)     Constitutional: Awake alert and oriented x3, patient is in distress due to pain. Skin: no rashes, no lesions, good skin turgor noted. Eyes: Pupils are equally reactive to light.  No evidence of scleral icterus or conjunctival pallor.  ENMT: Moist mucous membranes noted.  Posterior pharynx clear of any exudate or lesions.   Neck: normal, supple, no masses, no thyromegaly.  No evidence of jugular venous distension.   Respiratory: clear to auscultation bilaterally, no wheezing, no crackles. Normal respiratory effort. No accessory muscle use.  Cardiovascular: Regular rate and rhythm, no murmurs / rubs / gallops. No extremity edema. 2+  pedal pulses. No carotid bruits.  Chest:   Nontender without crepitus or deformity.   Back:   Nontender without crepitus or deformity. Abdomen: Abdomen is soft and nontender.  No evidence of intra-abdominal masses.  Positive bowel sounds noted in all quadrants.   Musculoskeletal: Pain in the bilateral shoulders with both passive and active range of motion.  No joint deformity upper and lower extremities. Good ROM, no contractures. Normal muscle tone.  Neurologic: CN 2-12 grossly intact. Sensation intact.  Patient moving all 4 extremities spontaneously.  Patient is following all commands.  Patient is responsive to verbal stimuli.   Psychiatric: Patient exhibits normal mood with appropriate affect.  Patient seems to possess insight as to their current situation.     Labs on Admission: I have personally reviewed following labs and imaging studies -   CBC: Recent Labs  Lab 05/09/21 1740  WBC 12.5*  NEUTROABS 5.7  HGB 11.4*  HCT 31.6*  MCV 85.2  PLT 344   Basic Metabolic Panel: Recent Labs  Lab 05/09/21 1740  NA 137  K 4.1  CL 111  CO2 18*  GLUCOSE 108*  BUN 11  CREATININE 0.78  CALCIUM 9.0   GFR: Estimated Creatinine Clearance: 68.8 mL/min (by C-G formula based on SCr of 0.78 mg/dL). Liver Function Tests: No results for input(s): AST, ALT, ALKPHOS, BILITOT, PROT, ALBUMIN in the last 168 hours. No results for input(s): LIPASE, AMYLASE in the last 168 hours. No results for input(s): AMMONIA in the last 168 hours. Coagulation Profile: No results for input(s): INR, PROTIME in the last 168 hours. Cardiac Enzymes: No results for input(s): CKTOTAL, CKMB, CKMBINDEX, TROPONINI in the last 168 hours. BNP (last 3 results) No results for input(s): PROBNP in the last 8760 hours. HbA1C: No results for input(s): HGBA1C in the last 72 hours. CBG: No results for input(s): GLUCAP in the last 168 hours. Lipid Profile: No results for input(s): CHOL, HDL, LDLCALC, TRIG, CHOLHDL, LDLDIRECT  in the last 72 hours. Thyroid Function Tests: No results for input(s): TSH, T4TOTAL, FREET4, T3FREE, THYROIDAB in the last 72 hours. Anemia Panel: Recent Labs    05/09/21 1740  RETICCTPCT 2.2   Urine analysis:    Component Value Date/Time   COLORURINE YELLOW 10/24/2019 0230   APPEARANCEUR CLEAR 10/24/2019 0230   LABSPEC 1.016 10/24/2019 0230   PHURINE 5.0 10/24/2019 0230   GLUCOSEU NEGATIVE 10/24/2019 0230   HGBUR NEGATIVE 10/24/2019 0230   BILIRUBINUR neg 04/07/2020 1014   KETONESUR NEGATIVE 10/24/2019 0230   PROTEINUR Negative 04/07/2020 1014   PROTEINUR NEGATIVE 10/24/2019 0230   UROBILINOGEN 0.2 04/07/2020 1014   UROBILINOGEN 1.0 12/25/2012 1755   NITRITE neg 04/07/2020 1014   NITRITE NEGATIVE 10/24/2019 0230   LEUKOCYTESUR Small (1+) (A) 04/07/2020 1014   LEUKOCYTESUR TRACE (A) 10/24/2019 0230    Radiological  Exams on Admission - Personally Reviewed: DG Chest 1 View  Result Date: 05/09/2021 CLINICAL DATA:  Sickle cell crisis. Pain in the left shoulder and lower leg. EXAM: CHEST  1 VIEW COMPARISON:  Two-view chest x-ray 03/10/2021 FINDINGS: Heart size is normal. Chronic interstitial coarsening is present in the lungs without underlying disease. Sclerotic changes in the humeral heads noted, left greater than right. Chronic bone changes in the thoracolumbar spine without acute abnormality. IMPRESSION: 1. No acute cardiopulmonary disease. 2. Sclerotic changes of the humeral heads, left greater than right. No acute osseous abnormality. Electronically Signed   By: Marin Roberts M.D.   On: 05/09/2021 18:10     Assessment/Plan  * Acute sickle cell crisis (HCC)- (present on admission) Patient presenting with bilateral shoulder and leg pain consistent with her usual sickle cell pain crisis No clinical evidence of acute chest syndrome Etiology of the sickle cell pain crisis is unclear although she reports that her son was recently ill with a upper respiratory tract  infection Chest x-ray unremarkable.  COVID-19 and influenza PCR testing negative.  Urinalysis pending. Patient patient on intravenous volume resuscitation with half-normal saline Treating patient with opiate-based analgesics via PCA pump with similar settings to what patient received during her last hospitalization  Leukocytosis- (present on admission) Patient presented with leukocytosis without other SIRS criteria Review old previous presentations reveals the patient seems to have a chronic leukocytosis Chest x-ray unremarkable Urinalysis pending No clinical evidence of active infection       Code Status:  Full code  code status decision has been confirmed with: patient Family Communication: deferred   Status is: Inpatient  Remains inpatient appropriate because: Acute sickle cell pain crisis requiring high-dose intravenous opiate-based analgesics, aggressive intravenous volume resuscitation and close clinical monitoring with serial blood testing and serial clinical assessments.        Marinda Elk MD [facility] Pager [phone]  If 7PM-7AM, please contact night-coverage [email or web address] Use universal [place] password for that web site. If you do not have the password, please call the hospital operator.  05/10/2021, 1:44 AM

## 2021-05-10 NOTE — Progress Notes (Signed)
Subjective: Lindsey [name] is a 51 year old female with a medical history significant for sickle cell disease, chronic pain syndrome, opiate dependence and tolerance, and history of anemia of chronic disease that was admitted for sickle cell pain crisis. Patient states that pain intensity has improved some overnight.  She rates her pain at 5/10.  She is not able to function at home at current pain intensity.  She denies any headache, chest pain, urinary symptoms, nausea, vomiting, or diarrhea.  Objective:  Vital signs in last 24 hours:  Vitals:   05/10/21 0411 05/10/21 0811 05/10/21 0902 05/10/21 1150  BP: (!) 103/58  100/69 (!) 92/55  Pulse: 82  78 61  Resp: 16 16 14 16   Temp: 98.4 F (36.9 C)  98.4 F (36.9 C)   TempSrc: Oral  Oral   SpO2: 96% 97% 98% 98%  Weight:      Height:        Intake/Output from previous day:   Intake/Output Summary (Last 24 hours) at 05/10/2021 1203 Last data filed at 05/10/2021 0308 Gross per 24 hour  Intake 213.56 ml  Output --  Net 213.56 ml    Physical Exam: General: Alert, awake, oriented x3, in no acute distress.  HEENT: [place]/AT PEERL, EOMI Neck: Trachea midline,  no masses, no thyromegal,y no JVD, no carotid bruit OROPHARYNX:  Moist, No exudate/ erythema/lesions.  Heart: Regular rate and rhythm, without murmurs, rubs, gallops, PMI non-displaced, no heaves or thrills on palpation.  Lungs: Clear to auscultation, no wheezing or rhonchi noted. No increased vocal fremitus resonant to percussion  Abdomen: Soft, nontender, nondistended, positive bowel sounds, no masses no hepatosplenomegaly noted..  Neuro: No focal neurological deficits noted cranial nerves II through XII grossly intact. DTRs 2+ bilaterally upper and lower extremities. Strength 5 out of 5 in bilateral upper and lower extremities. Musculoskeletal: No warm swelling or erythema around joints, no spinal tenderness noted. Psychiatric: Patient alert and oriented x3, good insight and  cognition, good recent to remote recall. Lymph node survey: No cervical axillary or inguinal lymphadenopathy noted.  Lab Results:  Basic Metabolic Panel:    Component Value Date/Time   NA 137 05/10/2021 0548   NA 140 01/07/2020 1127   K 4.0 05/10/2021 0548   CL 108 05/10/2021 0548   CO2 23 05/10/2021 0548   BUN 15 05/10/2021 0548   BUN 12 01/07/2020 1127   CREATININE 1.00 05/10/2021 0548   GLUCOSE 100 (H) 05/10/2021 0548   CALCIUM 8.6 (L) 05/10/2021 0548   CBC:    Component Value Date/Time   WBC 9.1 05/10/2021 0548   HGB 9.7 (L) 05/10/2021 0548   HGB 11.5 01/07/2020 1127   HCT 26.6 (L) 05/10/2021 0548   HCT 34.3 01/07/2020 1127   PLT 372 05/10/2021 0548   PLT 436 01/07/2020 1127   MCV 84.2 05/10/2021 0548   MCV 96 01/07/2020 1127   NEUTROABS 4.0 05/10/2021 0548   NEUTROABS 7.6 (H) 01/07/2020 1127   LYMPHSABS 3.9 05/10/2021 0548   LYMPHSABS 2.0 01/07/2020 1127   MONOABS 0.8 05/10/2021 0548   EOSABS 0.4 05/10/2021 0548   EOSABS 0.4 01/07/2020 1127   BASOSABS 0.1 05/10/2021 0548   BASOSABS 0.1 01/07/2020 1127    Recent Results (from the past 240 hour(s))  Resp Panel by RT-PCR (Flu A&B, Covid) Nasopharyngeal Swab     Status: None   Collection Time: 05/09/21 10:32 PM   Specimen: Nasopharyngeal Swab; Nasopharyngeal(NP) swabs in vial transport medium  Result Value Ref Range Status   SARS Coronavirus  2 by RT PCR NEGATIVE NEGATIVE Final    Comment: (NOTE) SARS-CoV-2 target nucleic acids are NOT DETECTED.  The SARS-CoV-2 RNA is generally detectable in upper respiratory specimens during the acute phase of infection. The lowest concentration of SARS-CoV-2 viral copies this assay can detect is 138 copies/mL. A negative result does not preclude SARS-Cov-2 infection and should not be used as the sole basis for treatment or other patient management decisions. A negative result may occur with  improper specimen collection/handling, submission of specimen other than  nasopharyngeal swab, presence of viral mutation(s) within the areas targeted by this assay, and inadequate number of viral copies(<138 copies/mL). A negative result must be combined with clinical observations, patient history, and epidemiological information. The expected result is Negative.  Fact Sheet for Patients:  EntrepreneurPulse.com.au  Fact Sheet for Healthcare Providers:  IncredibleEmployment.be  This test is no t yet approved or cleared by the [place] FDA and  has been authorized for detection and/or diagnosis of SARS-CoV-2 by FDA under an Emergency Use Authorization (EUA). This EUA will remain  in effect (meaning this test can be used) for the duration of the COVID-19 declaration under Section 564(b)(1) of the Act, 21 U.S.C.section 360bbb-3(b)(1), unless the authorization is terminated  or revoked sooner.       Influenza A by PCR NEGATIVE NEGATIVE Final   Influenza B by PCR NEGATIVE NEGATIVE Final    Comment: (NOTE) The Xpert Xpress SARS-CoV-2/FLU/RSV plus assay is intended as an aid in the diagnosis of influenza from Nasopharyngeal swab specimens and should not be used as a sole basis for treatment. Nasal washings and aspirates are unacceptable for Xpert Xpress SARS-CoV-2/FLU/RSV testing.  Fact Sheet for Patients: EntrepreneurPulse.com.au  Fact Sheet for Healthcare Providers: IncredibleEmployment.be  This test is not yet approved or cleared by the [place] FDA and has been authorized for detection and/or diagnosis of SARS-CoV-2 by FDA under an Emergency Use Authorization (EUA). This EUA will remain in effect (meaning this test can be used) for the duration of the COVID-19 declaration under Section 564(b)(1) of the Act, 21 U.S.C. section 360bbb-3(b)(1), unless the authorization is terminated or revoked.  Performed at [facility], [place] [address] [place], [place] [address]     Studies/Results: DG Chest 1 View  Result Date: 05/09/2021 CLINICAL DATA:  Sickle cell crisis. Pain in the left shoulder and lower leg. EXAM: CHEST  1 VIEW COMPARISON:  Two-view chest x-ray 03/10/2021 FINDINGS: Heart size is normal. Chronic interstitial coarsening is present in the lungs without underlying disease. Sclerotic changes in the humeral heads noted, left greater than right. Chronic bone changes in the thoracolumbar spine without acute abnormality. IMPRESSION: 1. No acute cardiopulmonary disease. 2. Sclerotic changes of the humeral heads, left greater than right. No acute osseous abnormality. Electronically Signed   By: San Morelle M.D.   On: 05/09/2021 18:10    Medications: Scheduled Meds:  enoxaparin (LOVENOX) injection  40 mg Subcutaneous [id number]   folic acid  1 mg Oral Daily   gabapentin  400 mg Oral TID   HYDROmorphone   Intravenous Q4H   Continuous Infusions:  0.45 % NaCl with KCl 20 mEq / L 10 mL/hr at 05/10/21 1156   diphenhydrAMINE     PRN Meds:.acetaminophen **OR** acetaminophen, diphenhydrAMINE **OR** diphenhydrAMINE, ketorolac, ondansetron **OR** ondansetron (ZOFRAN) IV, phenol, polyethylene glycol  Consultants: none  Procedures: none  Antibiotics: none  Assessment/Plan: Principal Problem:   Acute sickle cell crisis (HCC) Active Problems:   Leukocytosis   Sickle  cell disease with pain crisis: Reduce IV fluids to KVO Continue IV Dilaudid PCA without changes in settings OxyContin 10 mg every 12 hours Toradol 15 mg IV every 6 hours Monitor vital signs very closely, reevaluate pain scale regularly, and supplemental oxygen as needed  Leukocytosis: Resolved.  Patient is afebrile without any signs of infection.  No need for antibiotics at this time.  Continue to follow closely.   Hemoglobin is stable and consistent with patient's baseline.  There is no clinical indication for blood transfusion at this time.  Continue  to follow closely.  Chronic pain syndrome: Continue home medications  Code Status: Full Code Family Communication: N/A Disposition Plan: Not yet ready for discharge [place], MSN, FNP-C [facility] [address] [place], [place] [address] [phone]  If 5PM-8AM, please contact night-coverage.  05/10/2021, 12:03 PM  LOS: 1 day

## 2021-05-10 NOTE — Plan of Care (Signed)
°  Problem: Education: Goal: Knowledge of vaso-occlusive preventative measures will improve Outcome: Progressing Goal: Awareness of infection prevention will improve Outcome: Progressing Goal: Awareness of signs and symptoms of anemia will improve Outcome: Progressing Goal: Long-term complications will improve Outcome: Progressing   Problem: Self-Care: Goal: Ability to incorporate actions that prevent/reduce pain crisis will improve Outcome: Progressing   Problem: Activity: Goal: Risk for activity intolerance will decrease Outcome: Progressing   Problem: Bowel/Gastric: Goal: Gut motility will be maintained Outcome: Not Progressing   Problem: Nutrition: Goal: Adequate nutrition will be maintained Outcome: Not Progressing   Problem: Pain Managment: Goal: General experience of comfort will improve Outcome: Not Progressing

## 2021-05-10 NOTE — Assessment & Plan Note (Signed)
·   Patient presented with leukocytosis without other SIRS criteria  Review old previous presentations reveals the patient seems to have a chronic leukocytosis  Chest x-ray unremarkable  Urinalysis pending  No clinical evidence of active infection

## 2021-05-10 NOTE — Progress Notes (Signed)
Initial Nutrition Assessment  INTERVENTION:   -Ensure Enlive po BID, each supplement provides 350 kcal and 20 grams of protein  NUTRITION DIAGNOSIS:   Inadequate oral intake related to chronic illness (sickle cell pain) as evidenced by per patient/family report.  GOAL:   Patient will meet greater than or equal to 90% of their needs  MONITOR:   PO intake, Supplement acceptance, Labs, Weight trends, I & O's  REASON FOR ASSESSMENT:   Malnutrition Screening Tool    ASSESSMENT:   51 year old female with a medical history significant for sickle cell disease, chronic pain syndrome, opiate dependence and tolerance, and history of anemia of chronic disease that was admitted for sickle cell pain crisis.  Patient reports improved appetite now that her pain is more controlled. Pt may eat lunch or dinner during a pain crisis. Ate a McD[employer] McFlurry today.  Would like Ensure supplements, will order.  Per weight records, pt has lost 13 lbs since 12/23/20 (9% wt loss x 4.5 months, significant for time frame).   Medications: Folic acid  Labs reviewed.  NUTRITION - FOCUSED PHYSICAL EXAM:  No depletions noted.  Diet Order:   Diet Order             Diet regular Room service appropriate? Yes; Fluid consistency: Thin  Diet effective now                   EDUCATION NEEDS:   Education needs have been addressed  Skin:  Skin Assessment: Reviewed RN Assessment  Last BM:  1/2  Height:   Ht Readings from Last 1 Encounters:  05/09/21 5' (1.524 m)    Weight:   Wt Readings from Last 1 Encounters:  05/09/21 59.7 kg    BMI:  Body mass index is 25.7 kg/m.  Estimated Nutritional Needs:   Kcal:  1550-1750  Protein:  70-85g  Fluid:  1.8L/day   Tilda Franco, MS, RD, LDN Inpatient Clinical Dietitian Contact information available via Amion

## 2021-05-11 MED ORDER — OXYCODONE HCL 10 MG PO TABS: 10.0000 mg | ORAL_TABLET | Freq: Four times a day (QID) | ORAL | 0 refills | Status: DC | PRN

## 2021-05-11 NOTE — Discharge Summary (Signed)
Physician Discharge Summary  Lindsey [name] ZOX:[id number] DOB: June 12, 1970 DOA: 05/09/2021  PCP: Massie Maroon, FNP  Admit date: 05/09/2021  Discharge date: 05/11/2021  Discharge Diagnoses:  Principal Problem:   Acute sickle cell crisis (HCC) Active Problems:   Leukocytosis   Chronic pain   Anemia of chronic disease   Discharge Condition: Stable  Disposition:   Follow-up Information     Massie Maroon, FNP Follow up.   Specialty: Family Medicine Contact information: [address] [place] [address] [phone]                Pt is discharged home in good condition and is to follow up with Massie Maroon, FNP this week to have labs evaluated. Lindsey [name] is instructed to increase activity slowly and balance with rest for the next few days, and use prescribed medication to complete treatment of pain  Diet: Regular Wt Readings from Last 3 Encounters:  05/09/21 59.7 kg  04/25/21 61.2 kg  03/05/21 61.2 kg    History of present illness:  Lindsey [name] is a 51 year old female with a past medical history significant for sickle cell disease presents to [facility] emergency department with complaints of bilateral shoulder and leg pain. Patient explains that 2 days ago she began to develop bilateral shoulder pain as well as bilateral leg pain.  Pain was sharp in quality and rapidly became severe.  In intensity.  Pain waxes and wanes without any alleviating or exacerbating factors.  Patient states that this distribution of pain is consistent with her typical pain crisis. Upon further questioning, patient denies fever, vomiting, abdominal pain, dysuria, shortness of breath, cough, recent travel, sick contacts, or contact with confirmed COVID-19 infection.  Patient's pain became so severe that she presented to the emergency department for further evaluation.  Upon evaluation in the emergency department, patient was found to have  clinical signs and symptoms not to be consistent with acute sickle cell pain crisis.  Patient was initiated on IV fluids and given several doses of opiate-based analgesics without relief.  Due to persisting of sickle cell pain crisis, hospitalist group was called to assess patient for further admission to the hospital.  Hospital Course:  Sickle cell disease with pain crisis: Patient was admitted for sickle cell pain crisis and managed appropriately with IVF, IV Dilaudid via PCA and IV Toradol, as well as other adjunct therapies per sickle cell pain management protocols.  IV Dilaudid PCA was weaned appropriately.  Home pain medications resumed.  Patient's pain intensity has decreased to 3/10 and she is requesting discharge home.  Oxycodone 10 mg every 6 hours #10 was sent to patient's pharmacy.  PDMP was reviewed prior to prescribing opiate medications and no inconsistencies were noted. Patient is alert, oriented, and ambulating without assistance.  Patient was therefore discharged home today in a hemodynamically stable condition.   [place] will follow-up with PCP within 1 week of this discharge. Catilyn was counseled extensively about nonpharmacologic means of pain management, patient verbalized understanding and was appreciative of  the care received during this admission.   We discussed the need for good hydration, monitoring of hydration status, avoidance of heat, cold, stress, and infection triggers. We discussed the need to be adherent with taking home medications. Patient was reminded of the need to seek medical attention immediately if any symptom of bleeding, anemia, or infection occurs.  Discharge Exam: Vitals:   05/11/21 1200 05/11/21 1223  BP: 93/71   Pulse:  65   Resp: 18 14  Temp: 98.8 F (37.1 C)   SpO2: 100% 98%   Vitals:   05/11/21 0800 05/11/21 0822 05/11/21 1200 05/11/21 1223  BP: (!) 96/54  93/71   Pulse: 69  65   Resp: 18 17 18 14   Temp: 98.7 F (37.1 C)  98.8 F  (37.1 C)   TempSrc: Oral  Oral   SpO2: 98% 96% 100% 98%  Weight:      Height:        General appearance : Awake, alert, not in any distress. Speech Clear. Not toxic looking HEENT: Atraumatic and Normocephalic, pupils equally reactive to light and accomodation Neck: Supple, no JVD. No cervical lymphadenopathy.  Chest: Good air entry bilaterally, no added sounds  CVS: S1 S2 regular, no murmurs.  Abdomen: Bowel sounds present, Non tender and not distended with no guarding, rigidity or rebound. Extremities: B/L Lower Ext shows no edema, both legs are warm to touch Neurology: Awake alert, and oriented X 3, CN II-XII intact, Non focal Skin: No Rash  Discharge Instructions  Discharge Instructions     Discharge patient   Complete by: As directed    Discharge disposition: 01-Home or Self Care   Discharge patient date: 05/11/2021      Allergies as of 05/11/2021       Reactions   Ketamine Anxiety   Anxiety, elevated HR        Medication List     TAKE these medications    folic acid 1 MG tablet Commonly known as: FOLVITE Take 1 tablet (1 mg total) by mouth daily.   gabapentin 400 MG capsule Commonly known as: NEURONTIN Take 1 capsule (400 mg total) by mouth 3 (three) times daily.   oxyCODONE 10 mg 12 hr tablet Commonly known as: OXYCONTIN Take 1 tablet (10 mg total) by mouth every 12 (twelve) hours.   Oxycodone HCl 10 MG Tabs Take 1 tablet (10 mg total) by mouth every 6 (six) hours as needed for up to 15 days (pain).        The results of significant diagnostics from this hospitalization (including imaging, microbiology, ancillary and laboratory) are listed below for reference.    Significant Diagnostic Studies: DG Chest 1 View  Result Date: 05/09/2021 CLINICAL DATA:  Sickle cell crisis. Pain in the left shoulder and lower leg. EXAM: CHEST  1 VIEW COMPARISON:  Two-view chest x-ray 03/10/2021 FINDINGS: Heart size is normal. Chronic interstitial coarsening is present  in the lungs without underlying disease. Sclerotic changes in the humeral heads noted, left greater than right. Chronic bone changes in the thoracolumbar spine without acute abnormality. IMPRESSION: 1. No acute cardiopulmonary disease. 2. Sclerotic changes of the humeral heads, left greater than right. No acute osseous abnormality. Electronically Signed   By: Marin Robertshristopher  Mattern M.D.   On: 05/09/2021 18:10    Microbiology: Recent Results (from the past 240 hour(s))  Resp Panel by RT-PCR (Flu A&B, Covid) Nasopharyngeal Swab     Status: None   Collection Time: 05/09/21 10:32 PM   Specimen: Nasopharyngeal Swab; Nasopharyngeal(NP) swabs in vial transport medium  Result Value Ref Range Status   SARS Coronavirus 2 by RT PCR NEGATIVE NEGATIVE Final    Comment: (NOTE) SARS-CoV-2 target nucleic acids are NOT DETECTED.  The SARS-CoV-2 RNA is generally detectable in upper respiratory specimens during the acute phase of infection. The lowest concentration of SARS-CoV-2 viral copies this assay can detect is 138 copies/mL. A negative result does not preclude SARS-Cov-2 infection  and should not be used as the sole basis for treatment or other patient management decisions. A negative result may occur with  improper specimen collection/handling, submission of specimen other than nasopharyngeal swab, presence of viral mutation(s) within the areas targeted by this assay, and inadequate number of viral copies(<138 copies/mL). A negative result must be combined with clinical observations, patient history, and epidemiological information. The expected result is Negative.  Fact Sheet for Patients:  BloggerCourse.com  Fact Sheet for Healthcare Providers:  SeriousBroker.it  This test is no t yet approved or cleared by the [place] FDA and  has been authorized for detection and/or diagnosis of SARS-CoV-2 by FDA under an Emergency Use Authorization (EUA).  This EUA will remain  in effect (meaning this test can be used) for the duration of the COVID-19 declaration under Section 564(b)(1) of the Act, 21 U.S.C.section 360bbb-3(b)(1), unless the authorization is terminated  or revoked sooner.       Influenza A by PCR NEGATIVE NEGATIVE Final   Influenza B by PCR NEGATIVE NEGATIVE Final    Comment: (NOTE) The Xpert Xpress SARS-CoV-2/FLU/RSV plus assay is intended as an aid in the diagnosis of influenza from Nasopharyngeal swab specimens and should not be used as a sole basis for treatment. Nasal washings and aspirates are unacceptable for Xpert Xpress SARS-CoV-2/FLU/RSV testing.  Fact Sheet for Patients: BloggerCourse.com  Fact Sheet for Healthcare Providers: SeriousBroker.it  This test is not yet approved or cleared by the [place] FDA and has been authorized for detection and/or diagnosis of SARS-CoV-2 by FDA under an Emergency Use Authorization (EUA). This EUA will remain in effect (meaning this test can be used) for the duration of the COVID-19 declaration under Section 564(b)(1) of the Act, 21 U.S.C. section 360bbb-3(b)(1), unless the authorization is terminated or revoked.  Performed at [facility], [address] [place], [place] [address]      Labs: Basic Metabolic Panel: Recent Labs  Lab 05/09/21 1740 05/10/21 0548  NA 137 137  K 4.1 4.0  CL 111 108  CO2 18* 23  GLUCOSE 108* 100*  BUN 11 15  CREATININE 0.78 1.00  CALCIUM 9.0 8.6*  MG  --  2.1   Liver Function Tests: Recent Labs  Lab 05/10/21 0548  AST 18  ALT 14  ALKPHOS 46  BILITOT 1.4*  PROT 6.6  ALBUMIN 3.8   No results for input(s): LIPASE, AMYLASE in the last 168 hours. No results for input(s): AMMONIA in the last 168 hours. CBC: Recent Labs  Lab 05/09/21 1740 05/10/21 0548  WBC 12.5* 9.1  NEUTROABS 5.7 4.0  HGB 11.4* 9.7*  HCT 31.6* 26.6*  MCV 85.2 84.2  PLT 344  372   Cardiac Enzymes: No results for input(s): CKTOTAL, CKMB, CKMBINDEX, TROPONINI in the last 168 hours. BNP: Invalid input(s): POCBNP CBG: No results for input(s): GLUCAP in the last 168 hours.  Time coordinating discharge: 30 minutes  Signed:  [name]  APRN, MSN, FNP-C Patient Care [facility] [address] [place], [place] [address] [phone]  [facility] 05/11/2021, 12:53 PM

## 2021-05-11 NOTE — Progress Notes (Signed)
Patient's chart states that she has a legal guardian. She states that she does not know where that information came from and that she does not have a legal guardian, she makes her own decisions.  Based on admission assessment completed 05/09/2021 by H. Fatima Sanger RN it states that spouse is legal guardian, but that information is inaccurate by patient report.

## 2021-05-15 ENCOUNTER — Other Ambulatory Visit: Payer: Self-pay | Admitting: Family Medicine

## 2021-05-15 DIAGNOSIS — D5700 Hb-SS disease with crisis, unspecified: Secondary | ICD-10-CM

## 2021-05-15 DIAGNOSIS — G894 Chronic pain syndrome: Secondary | ICD-10-CM

## 2021-05-18 ENCOUNTER — Ambulatory Visit: Payer: Medicaid Other | Admitting: Family Medicine

## 2021-05-20 ENCOUNTER — Other Ambulatory Visit: Payer: Self-pay

## 2021-05-20 ENCOUNTER — Encounter: Payer: Self-pay | Admitting: Family Medicine

## 2021-05-20 ENCOUNTER — Ambulatory Visit (INDEPENDENT_AMBULATORY_CARE_PROVIDER_SITE_OTHER): Payer: Medicaid Other | Admitting: Family Medicine

## 2021-05-20 VITALS — BP 113/68 | HR 76 | Ht 60.0 in | Wt 136.2 lb

## 2021-05-20 DIAGNOSIS — G894 Chronic pain syndrome: Secondary | ICD-10-CM

## 2021-05-20 DIAGNOSIS — Z23 Encounter for immunization: Secondary | ICD-10-CM | POA: Diagnosis not present

## 2021-05-20 DIAGNOSIS — E559 Vitamin D deficiency, unspecified: Secondary | ICD-10-CM | POA: Diagnosis not present

## 2021-05-20 DIAGNOSIS — D5700 Hb-SS disease with crisis, unspecified: Secondary | ICD-10-CM | POA: Diagnosis not present

## 2021-05-20 MED ORDER — GABAPENTIN 400 MG PO CAPS: ORAL_CAPSULE | ORAL | 3 refills | Status: DC

## 2021-05-20 MED ORDER — XTAMPZA ER 13.5 MG PO C12A: 13.5000 mg | EXTENDED_RELEASE_CAPSULE | Freq: Two times a day (BID) | ORAL | 0 refills | Status: DC

## 2021-05-20 MED ORDER — IBUPROFEN 800 MG PO TABS: 800.0000 mg | ORAL_TABLET | Freq: Three times a day (TID) | ORAL | 3 refills | Status: DC | PRN

## 2021-05-20 NOTE — Patient Instructions (Signed)
Please call Dr. Truman Hayward to schedule eye exam, first available appointment  Sickle Cell Anemia, Adult Sickle cell anemia is a condition where your red blood cells are shaped like sickles. Red blood cells carry oxygen through the body. Sickle-shaped cells do not live as long as normal red blood cells. They also clump together and block blood from flowing through the blood vessels. This prevents the body from getting enough oxygen. Sickle cell anemia causes organ damage and pain. It also increases the risk of infection. Follow these instructions at home: Medicines Take over-the-counter and prescription medicines only as told by your doctor. If you were prescribed an antibiotic medicine, take it as told by your doctor. Do not stop taking the antibiotic even if you start to feel better. If you develop a fever, do not take medicines to lower the fever right away. Tell your doctor about the fever. Managing pain, stiffness, and swelling Try these methods to help with pain: Use a heating pad. Take a warm bath. Distract yourself, such as by watching TV. Eating and drinking Drink enough fluid to keep your pee (urine) clear or pale yellow. Drink more in hot weather and during exercise. Limit or avoid alcohol. Eat a healthy diet. Eat plenty of fruits, vegetables, whole grains, and lean protein. Take vitamins and supplements as told by your doctor. Traveling When traveling, keep these with you: Your medical information. The names of your doctors. Your medicines. If you need to take an airplane, talk to your doctor first. Activity Rest often. Avoid exercises that make your heart beat much faster, such as jogging. General instructions Do not use products that have nicotine or tobacco, such as cigarettes and e-cigarettes. If you need help quitting, ask your doctor. Consider wearing a medical alert bracelet. Avoid being in high places (high altitudes), such as mountains. Avoid very hot or cold  temperatures. Avoid places where the temperature changes a lot. Keep all follow-up visits as told by your doctor. This is important. Contact a doctor if: A joint hurts. Your feet or hands hurt or swell. You feel tired (fatigued). Get help right away if: You have symptoms of infection. These include: Fever. Chills. Being very tired. Irritability. Poor eating. Throwing up (vomiting). You feel dizzy or faint. You have new stomach pain, especially on the left side. You have a an erection (priapism) that lasts more than 4 hours. You have numbness in your arms or legs. You have a hard time moving your arms or legs. You have trouble talking. You have pain that does not go away when you take medicine. You are short of breath. You are breathing fast. You have a long-term cough. You have pain in your chest. You have a bad headache. You have a stiff neck. Your stomach looks bloated even though you did not eat much. Your skin is pale. You suddenly cannot see well. Summary Sickle cell anemia is a condition where your red blood cells are shaped like sickles. Follow your doctor's advice on ways to manage pain, food to eat, activities to do, and steps to take for safe travel. Get medical help right away if you have any signs of infection, such as a fever. This information is not intended to replace advice given to you by your health care provider. Make sure you discuss any questions you have with your health care provider. Document Revised: 09/19/2019 Document Reviewed: 09/19/2019 Elsevier Patient Education  [place].

## 2021-05-20 NOTE — Progress Notes (Signed)
[facility]   Established Patient Office Visit  Subjective:  Patient ID: Lindsey [name], female    DOB: [birth year]  Age: 51 y.o. MRN: [id number]  CC:  Chief Complaint  Patient presents with   Eye Problem    Pt states it may be a floater    HPI Lindsey [name] is a very pleasant 51 year old female with a medical history significant for sickle cell disease, chronic pain syndrome, opiate dependence and tolerance, vitamin D deficiency, and history of anemia of chronic disease that presents for follow-up of chronic conditions.  Patient says that she has been having increase body pain and attributes increase in body pain to changes in weather.  Patient was recently hospitalized for sickle cell pain crisis.  Pain intensity has not been well controlled with her current medication regimen.  Patient has been unable to obtain her long-acting medication due to insurance constraints.  Her pain intensity today is 5/10 characterized as intermittent and aching.  Ms. [name] is not up-to-date with her eye exam.  She reports that she has been seeing "floaters" over the past several weeks.  Patient was previously followed by Dr. Nile RiggsShapiro, ophthalmologist but has been lost to follow-up.  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   Opioid dependence in remission (HCC)    Sickle cell crisis (HCC)     Past Surgical History:  Procedure Laterality Date   APPENDECTOMY     CESAREAN SECTION     OTHER SURGICAL HISTORY     c-section    Family History  Problem Relation Age of Onset   Stroke Neg Hx        none that she knows of    Seizures Neg Hx     Social History   Socioeconomic History   Marital status: Married    Spouse name: Not on file   Number of children: 2   Years of education: Not on file   Highest education level: Not on file  Occupational History   Not on file  Tobacco Use   Smoking status: Some  Days    Packs/day: 0.00    Types: Cigars, Cigarettes   Smokeless tobacco: Never  Vaping Use   Vaping Use: Former  Substance and Sexual Activity   Alcohol use: Yes    Comment: occasionally   Drug use: No   Sexual activity: Yes    Birth control/protection: None  Other Topics Concern   Not on file  Social History Narrative   Lives at home with husband and son   Right handed   Social Determinants of Health   Financial Resource Strain: Not on file  Food Insecurity: Not on file  Transportation Needs: Not on file  Physical Activity: Not on file  Stress: Not on file  Social Connections: Not on file  Intimate Partner Violence: Not on file    Outpatient Medications Prior to Visit  Medication Sig Dispense Refill   folic acid (FOLVITE) 1 MG tablet Take 1 tablet (1 mg total) by mouth daily. 30 tablet 11   oxyCODONE (OXYCONTIN) 10 mg 12 hr tablet Take 1 tablet (10 mg total) by mouth every 12 (twelve) hours. 60 tablet 0   Oxycodone HCl 10 MG TABS Take 1 tablet (10 mg total) by mouth every 6 (six) hours as needed for up to 15 days (pain). 60 tablet 0   gabapentin (NEURONTIN) 400 MG capsule TAKE 1 CAPSULE(400 MG) BY  MOUTH THREE TIMES DAILY 90 capsule 0   No facility-administered medications prior to visit.    Allergies  Allergen Reactions   Ketamine Anxiety    Anxiety, elevated HR    ROS Review of Systems  Constitutional: Negative.   HENT: Negative.    Respiratory: Negative.    Gastrointestinal: Negative.   Endocrine: Negative.   Genitourinary: Negative.   Musculoskeletal:  Positive for arthralgias and back pain.  Skin: Negative.   Psychiatric/Behavioral: Negative.       Objective:    Physical Exam Constitutional:      Appearance: Normal appearance.  Eyes:     Pupils: Pupils are equal, round, and reactive to light.  Cardiovascular:     Rate and Rhythm: Normal rate and regular rhythm.     Pulses: Normal pulses.  Pulmonary:     Effort: Pulmonary effort is normal.   Abdominal:     General: Bowel sounds are normal.  Musculoskeletal:        General: Normal range of motion.  Skin:    General: Skin is warm.  Neurological:     General: No focal deficit present.     Mental Status: She is alert. Mental status is at baseline.  Psychiatric:        Mood and Affect: Mood normal.        Behavior: Behavior normal.        Thought Content: Thought content normal.        Judgment: Judgment normal.    BP 113/68    Pulse 76    Wt 136 lb 3.2 oz (61.8 kg)    SpO2 98%    BMI 26.60 kg/m  Wt Readings from Last 3 Encounters:  05/20/21 136 lb 3.2 oz (61.8 kg)  05/09/21 131 lb 9.8 oz (59.7 kg)  04/25/21 135 lb (61.2 kg)     Health Maintenance Due  Topic Date Due   COVID-19 Vaccine (1) Never done   Hepatitis C Screening  Never done   Pneumococcal Vaccine 84-36 Years old (2 - PCV) 10/06/2018    There are no preventive care reminders to display for this patient.  Lab Results  Component Value Date   TSH 2.480 02/20/2019   Lab Results  Component Value Date   WBC 9.1 05/10/2021   HGB 9.7 (L) 05/10/2021   HCT 26.6 (L) 05/10/2021   MCV 84.2 05/10/2021   PLT 372 05/10/2021   Lab Results  Component Value Date   NA 137 05/10/2021   K 4.0 05/10/2021   CO2 23 05/10/2021   GLUCOSE 100 (H) 05/10/2021   BUN 15 05/10/2021   CREATININE 1.00 05/10/2021   BILITOT 1.4 (H) 05/10/2021   ALKPHOS 46 05/10/2021   AST 18 05/10/2021   ALT 14 05/10/2021   PROT 6.6 05/10/2021   ALBUMIN 3.8 05/10/2021   CALCIUM 8.6 (L) 05/10/2021   ANIONGAP 6 05/10/2021   Lab Results  Component Value Date   CHOL  07/25/2007    119        ATP III CLASSIFICATION:  <200     mg/dL   Desirable  213-086  mg/dL   Borderline High  >=578    mg/dL   High   Lab Results  Component Value Date   HDL 33 (L) 07/25/2007   Lab Results  Component Value Date   LDLCALC  07/25/2007    78        Total Cholesterol/HDL:CHD Risk Coronary Heart Disease Risk Table  Men    Women  1/2 Average Risk   3.4   3.3   Lab Results  Component Value Date   TRIG 38 07/25/2007   Lab Results  Component Value Date   CHOLHDL 3.6 07/25/2007   No results found for: HGBA1C    Assessment & Plan:   Problem List Items Addressed This Visit       Other   Sickle cell anemia with pain (HCC)   Relevant Medications   gabapentin (NEURONTIN) 400 MG capsule   ibuprofen (ADVIL) 800 MG tablet   Chronic pain   Relevant Medications   gabapentin (NEURONTIN) 400 MG capsule   ibuprofen (ADVIL) 800 MG tablet    Meds ordered this encounter  Medications   gabapentin (NEURONTIN) 400 MG capsule    Sig: TAKE 1 CAPSULE(400 MG) BY MOUTH THREE TIMES DAILY    Dispense:  90 capsule    Refill:  3    Remaining refills will be given at next appt.    Order Specific Question:   Supervising Provider    Answer:   Quentin Angst [[id number]]   ibuprofen (ADVIL) 800 MG tablet    Sig: Take 1 tablet (800 mg total) by mouth every 8 (eight) hours as needed.    Dispense:  30 tablet    Refill:  3    Order Specific Question:   Supervising Provider    Answer:   Jeanann Lewandowsky E [id number]   1. Sickle cell anemia with pain (HCC) Ms. Gudiel will continue her current opiate medication regimen, however we discussed the importance of adjunct medications for increased pain control. - gabapentin (NEURONTIN) 400 MG capsule; TAKE 1 CAPSULE(400 MG) BY MOUTH THREE TIMES DAILY  Dispense: 90 capsule; Refill: 3 - ibuprofen (ADVIL) 800 MG tablet; Take 1 tablet (800 mg total) by mouth every 8 (eight) hours as needed.  Dispense: 30 tablet; Refill: 3 - Sickle Cell Panel  2. Chronic pain syndrome For greater pain control, will change long-acting opiate to [facility] ER 13.5 mg every 12 hours. - gabapentin (NEURONTIN) 400 MG capsule; TAKE 1 CAPSULE(400 MG) BY MOUTH THREE TIMES DAILY  Dispense: 90 capsule; Refill: 3 - ibuprofen (ADVIL) 800 MG tablet; Take 1 tablet (800 mg total) by mouth every 8 (eight) hours as  needed.  Dispense: 30 tablet; Refill: 3 - oxyCODONE ER (XTAMPZA ER) 13.5 MG C12A; Take 13.5 mg by mouth every 12 (twelve) hours.  Dispense: 60 capsule; Refill: 0 - [id number] 11+Oxyco+Alc+Crt-Bund  3. Vitamin D deficiency  - Sickle Cell Panel  4. Need for pneumococcal vaccine  - Pneumococcal polysaccharide vaccine 23-valent greater than or equal to 2yo subcutaneous/IM    Ms. Yerby was advised to follow-up with her ophthalmologist, first available appointment.  Also, she warrants her yearly eye exam.   Follow-up: Return in about 3 months (around 08/18/2021) for sickle cell anemia.    [name]  APRN, MSN, FNP-C [facility] [address] [place], [place] [address] [phone]

## 2021-05-21 LAB — CMP14+CBC/D/PLT+FER+RETIC+V...
ALT: 13 IU/L (ref 0–32)
AST: 19 IU/L (ref 0–40)
Albumin/Globulin Ratio: 1.8 (ref 1.2–2.2)
Albumin: 4.6 g/dL (ref 3.8–4.8)
Alkaline Phosphatase: 63 IU/L (ref 44–121)
BUN/Creatinine Ratio: 12 (ref 9–23)
BUN: 10 mg/dL (ref 6–24)
Basophils Absolute: 0.1 10*3/uL (ref 0.0–0.2)
Basos: 1 %
Bilirubin Total: 1 mg/dL (ref 0.0–1.2)
CO2: 18 mmol/L — ABNORMAL LOW (ref 20–29)
Calcium: 9.6 mg/dL (ref 8.7–10.2)
Chloride: 106 mmol/L (ref 96–106)
Creatinine, Ser: 0.81 mg/dL (ref 0.57–1.00)
EOS (ABSOLUTE): 0.3 10*3/uL (ref 0.0–0.4)
Eos: 4 %
Ferritin: 834 ng/mL — ABNORMAL HIGH (ref 15–150)
Globulin, Total: 2.5 g/dL (ref 1.5–4.5)
Glucose: 87 mg/dL (ref 70–99)
Hematocrit: 31.3 % — ABNORMAL LOW (ref 34.0–46.6)
Hemoglobin: 11 g/dL — ABNORMAL LOW (ref 11.1–15.9)
Immature Grans (Abs): 0 10*3/uL (ref 0.0–0.1)
Immature Granulocytes: 0 %
Lymphocytes Absolute: 2.5 10*3/uL (ref 0.7–3.1)
Lymphs: 29 %
MCH: 30.9 pg (ref 26.6–33.0)
MCHC: 35.1 g/dL (ref 31.5–35.7)
MCV: 88 fL (ref 79–97)
Monocytes Absolute: 0.5 10*3/uL (ref 0.1–0.9)
Monocytes: 5 %
Neutrophils Absolute: 5.4 10*3/uL (ref 1.4–7.0)
Neutrophils: 61 %
Platelets: 509 10*3/uL — ABNORMAL HIGH (ref 150–450)
Potassium: 4.8 mmol/L (ref 3.5–5.2)
RBC: 3.56 x10E6/uL — ABNORMAL LOW (ref 3.77–5.28)
RDW: 17.3 % — ABNORMAL HIGH (ref 11.7–15.4)
Retic Ct Pct: 2.9 % — ABNORMAL HIGH (ref 0.6–2.6)
Sodium: 142 mmol/L (ref 134–144)
Total Protein: 7.1 g/dL (ref 6.0–8.5)
Vit D, 25-Hydroxy: 25.2 ng/mL — ABNORMAL LOW (ref 30.0–100.0)
WBC: 8.8 10*3/uL (ref 3.4–10.8)
eGFR: 89 mL/min/{1.73_m2} (ref >59)

## 2021-05-24 ENCOUNTER — Other Ambulatory Visit: Payer: Self-pay | Admitting: Family Medicine

## 2021-05-24 DIAGNOSIS — G894 Chronic pain syndrome: Secondary | ICD-10-CM

## 2021-05-24 MED ORDER — OXYCODONE HCL 10 MG PO TABS: 10.0000 mg | ORAL_TABLET | Freq: Four times a day (QID) | ORAL | 0 refills | Status: DC | PRN

## 2021-05-24 NOTE — Progress Notes (Signed)
Reviewed PDMP substance reporting system prior to prescribing opiate medications. No inconsistencies noted.  Meds ordered this encounter  Medications   Oxycodone HCl 10 MG TABS    Sig: Take 1 tablet (10 mg total) by mouth every 6 (six) hours as needed for up to 15 days (pain).    Dispense:  60 tablet    Refill:  0    Order Specific Question:   Supervising Provider    Answer:   [name], [name]   [name]  APRN, MSN, FNP-C [facility] [address]  [place], [place] [address] [phone]  

## 2021-05-25 ENCOUNTER — Telehealth: Payer: Self-pay

## 2021-05-26 LAB — OXYCODONE/OXYMORPHONE, CONFIRM
OXYCODONE/OXYMORPH: POSITIVE — AB
OXYCODONE: 772 ng/mL
OXYCODONE: POSITIVE — AB
OXYMORPHONE (GC/MS): >3000 ng/mL
OXYMORPHONE: POSITIVE — AB

## 2021-05-26 LAB — DRUG SCREEN 764883 11+OXYCO+ALC+CRT-BUND
Amphetamines, Urine: NEGATIVE ng/mL
BENZODIAZ UR QL: NEGATIVE ng/mL
Barbiturate: NEGATIVE ng/mL
Cannabinoid Quant, Ur: NEGATIVE ng/mL
Cocaine (Metabolite): NEGATIVE ng/mL
Creatinine: 63.3 mg/dL (ref 20.0–300.0)
Ethanol: NEGATIVE %
Meperidine: NEGATIVE ng/mL
Methadone Screen, Urine: NEGATIVE ng/mL
Phencyclidine: NEGATIVE ng/mL
Propoxyphene: NEGATIVE ng/mL
Tramadol: NEGATIVE ng/mL
pH, Urine: 5.5 (ref 4.5–8.9)

## 2021-05-26 LAB — OPIATES CONFIRMATION, URINE: Opiates: NEGATIVE ng/mL

## 2021-05-30 DIAGNOSIS — Z20822 Contact with and (suspected) exposure to covid-19: Secondary | ICD-10-CM | POA: Diagnosis present

## 2021-05-30 DIAGNOSIS — F17290 Nicotine dependence, other tobacco product, uncomplicated: Secondary | ICD-10-CM | POA: Diagnosis present

## 2021-05-30 DIAGNOSIS — N390 Urinary tract infection, site not specified: Secondary | ICD-10-CM | POA: Diagnosis present

## 2021-05-30 DIAGNOSIS — G894 Chronic pain syndrome: Secondary | ICD-10-CM | POA: Diagnosis present

## 2021-05-30 DIAGNOSIS — F1121 Opioid dependence, in remission: Secondary | ICD-10-CM | POA: Diagnosis present

## 2021-05-30 DIAGNOSIS — Z79899 Other long term (current) drug therapy: Secondary | ICD-10-CM

## 2021-05-30 DIAGNOSIS — F17210 Nicotine dependence, cigarettes, uncomplicated: Secondary | ICD-10-CM | POA: Diagnosis present

## 2021-05-30 DIAGNOSIS — D5700 Hb-SS disease with crisis, unspecified: Principal | ICD-10-CM | POA: Diagnosis present

## 2021-05-30 DIAGNOSIS — Z888 Allergy status to other drugs, medicaments and biological substances status: Secondary | ICD-10-CM

## 2021-05-31 ENCOUNTER — Encounter (HOSPITAL_COMMUNITY): Payer: Self-pay

## 2021-05-31 ENCOUNTER — Inpatient Hospital Stay (HOSPITAL_COMMUNITY)
Admission: EM | Admit: 2021-05-31 | Discharge: 2021-06-02 | DRG: 812 | Disposition: A | Discharge status: Home / Self Care | Payer: Medicaid Other | Attending: Internal Medicine | Admitting: Internal Medicine

## 2021-05-31 ENCOUNTER — Other Ambulatory Visit: Payer: Self-pay

## 2021-05-31 DIAGNOSIS — F17290 Nicotine dependence, other tobacco product, uncomplicated: Secondary | ICD-10-CM | POA: Diagnosis present

## 2021-05-31 DIAGNOSIS — D5700 Hb-SS disease with crisis, unspecified: Secondary | ICD-10-CM | POA: Diagnosis not present

## 2021-05-31 DIAGNOSIS — N390 Urinary tract infection, site not specified: Secondary | ICD-10-CM | POA: Diagnosis present

## 2021-05-31 DIAGNOSIS — G894 Chronic pain syndrome: Secondary | ICD-10-CM | POA: Diagnosis present

## 2021-05-31 DIAGNOSIS — Z79899 Other long term (current) drug therapy: Secondary | ICD-10-CM | POA: Diagnosis not present

## 2021-05-31 DIAGNOSIS — F17210 Nicotine dependence, cigarettes, uncomplicated: Secondary | ICD-10-CM | POA: Diagnosis present

## 2021-05-31 DIAGNOSIS — F1121 Opioid dependence, in remission: Secondary | ICD-10-CM | POA: Diagnosis present

## 2021-05-31 DIAGNOSIS — Z888 Allergy status to other drugs, medicaments and biological substances status: Secondary | ICD-10-CM | POA: Diagnosis not present

## 2021-05-31 DIAGNOSIS — Z20822 Contact with and (suspected) exposure to covid-19: Secondary | ICD-10-CM | POA: Diagnosis present

## 2021-05-31 LAB — URINALYSIS, ROUTINE W REFLEX MICROSCOPIC
Bilirubin Urine: NEGATIVE
Glucose, UA: NEGATIVE mg/dL
Ketones, ur: NEGATIVE mg/dL
Nitrite: NEGATIVE
Protein, ur: 100 mg/dL — AB
Specific Gravity, Urine: 1.012 (ref 1.005–1.030)
Squamous Epithelial / HPF: >50 — ABNORMAL HIGH (ref 0–5)
WBC, UA: >50 WBC/hpf — ABNORMAL HIGH (ref 0–5)
pH: 5 (ref 5.0–8.0)

## 2021-05-31 LAB — COMPREHENSIVE METABOLIC PANEL
ALT: 14 U/L (ref 0–44)
AST: 17 U/L (ref 15–41)
Albumin: 4 g/dL (ref 3.5–5.0)
Alkaline Phosphatase: 50 U/L (ref 38–126)
Anion gap: 7 (ref 5–15)
BUN: 9 mg/dL (ref 6–20)
CO2: 25 mmol/L (ref 22–32)
Calcium: 8.9 mg/dL (ref 8.9–10.3)
Chloride: 108 mmol/L (ref 98–111)
Creatinine, Ser: 0.76 mg/dL (ref 0.44–1.00)
GFR, Estimated: >60 mL/min (ref >60)
Glucose, Bld: 98 mg/dL (ref 70–99)
Potassium: 4 mmol/L (ref 3.5–5.1)
Sodium: 140 mmol/L (ref 135–145)
Total Bilirubin: 1.3 mg/dL — ABNORMAL HIGH (ref 0.3–1.2)
Total Protein: 7 g/dL (ref 6.5–8.1)

## 2021-05-31 LAB — CBC WITH DIFFERENTIAL/PLATELET
Abs Immature Granulocytes: 0.04 10*3/uL (ref 0.00–0.07)
Basophils Absolute: 0.1 10*3/uL (ref 0.0–0.1)
Basophils Relative: 0 %
Eosinophils Absolute: 0.5 10*3/uL (ref 0.0–0.5)
Eosinophils Relative: 4 %
HCT: 27.3 % — ABNORMAL LOW (ref 36.0–46.0)
Hemoglobin: 10.1 g/dL — ABNORMAL LOW (ref 12.0–15.0)
Immature Granulocytes: 0 %
Lymphocytes Relative: 33 %
Lymphs Abs: 4.1 10*3/uL — ABNORMAL HIGH (ref 0.7–4.0)
MCH: 31.3 pg (ref 26.0–34.0)
MCHC: 37 g/dL — ABNORMAL HIGH (ref 30.0–36.0)
MCV: 84.5 fL (ref 80.0–100.0)
Monocytes Absolute: 0.8 10*3/uL (ref 0.1–1.0)
Monocytes Relative: 7 %
Neutro Abs: 6.8 10*3/uL (ref 1.7–7.7)
Neutrophils Relative %: 56 %
Platelets: 401 10*3/uL — ABNORMAL HIGH (ref 150–400)
RBC: 3.23 MIL/uL — ABNORMAL LOW (ref 3.87–5.11)
RDW: 14.9 % (ref 11.5–15.5)
WBC: 12.3 10*3/uL — ABNORMAL HIGH (ref 4.0–10.5)
nRBC: 0.4 % — ABNORMAL HIGH (ref 0.0–0.2)

## 2021-05-31 LAB — MRSA NEXT GEN BY PCR, NASAL: MRSA by PCR Next Gen: DETECTED — AB

## 2021-05-31 LAB — RESP PANEL BY RT-PCR (FLU A&B, COVID) ARPGX2
Influenza A by PCR: NEGATIVE
Influenza B by PCR: NEGATIVE
SARS Coronavirus 2 by RT PCR: NEGATIVE

## 2021-05-31 LAB — RETICULOCYTES
Immature Retic Fract: 40.7 % — ABNORMAL HIGH (ref 2.3–15.9)
RBC.: 3.25 MIL/uL — ABNORMAL LOW (ref 3.87–5.11)
Retic Count, Absolute: 116.4 10*3/uL (ref 19.0–186.0)
Retic Ct Pct: 3.6 % — ABNORMAL HIGH (ref 0.4–3.1)

## 2021-05-31 LAB — I-STAT BETA HCG BLOOD, ED (MC, WL, AP ONLY): I-stat hCG, quantitative: <5 m[IU]/mL (ref <5)

## 2021-05-31 MED ORDER — SODIUM CHLORIDE 0.9% FLUSH: 9.0000 mL | INTRAVENOUS | Status: DC | PRN

## 2021-05-31 MED ORDER — ONDANSETRON HCL 4 MG/2ML IJ SOLN
4.0000 mg | INTRAMUSCULAR | Status: DC | PRN
  Administered 2021-05-31: 4 mg via INTRAVENOUS
  Filled 2021-05-31: qty 2

## 2021-05-31 MED ORDER — HYDROMORPHONE HCL 2 MG/ML IJ SOLN
2.0000 mg | INTRAMUSCULAR | Status: DC
  Filled 2021-05-31: qty 1

## 2021-05-31 MED ORDER — KETOROLAC TROMETHAMINE 15 MG/ML IJ SOLN
15.0000 mg | Freq: Four times a day (QID) | INTRAMUSCULAR | Status: DC
  Administered 2021-05-31: 15 mg via INTRAVENOUS
  Filled 2021-05-31: qty 1

## 2021-05-31 MED ORDER — HYDROMORPHONE HCL 1 MG/ML IJ SOLN
1.0000 mg | INTRAMUSCULAR | Status: DC | PRN
  Administered 2021-05-31 (×2): 1 mg via INTRAVENOUS
  Filled 2021-05-31 (×2): qty 1

## 2021-05-31 MED ORDER — ONDANSETRON HCL 4 MG/2ML IJ SOLN: 4.0000 mg | Freq: Four times a day (QID) | INTRAMUSCULAR | Status: DC | PRN

## 2021-05-31 MED ORDER — DIPHENHYDRAMINE HCL 25 MG PO CAPS
25.0000 mg | ORAL_CAPSULE | Freq: Four times a day (QID) | ORAL | Status: DC | PRN
  Administered 2021-05-31 (×2): 25 mg via ORAL
  Filled 2021-05-31 (×2): qty 1

## 2021-05-31 MED ORDER — SODIUM CHLORIDE 0.45 % IV SOLN: INTRAVENOUS | Status: DC

## 2021-05-31 MED ORDER — HYDROMORPHONE 1 MG/ML IV SOLN
INTRAVENOUS | Status: DC
  Administered 2021-05-31: 1.8 mg via INTRAVENOUS
  Administered 2021-05-31: 1.2 mg via INTRAVENOUS
  Administered 2021-05-31: 3.5 mg via INTRAVENOUS
  Administered 2021-05-31 – 2021-06-01 (×2): 1.5 mg via INTRAVENOUS
  Administered 2021-06-01: 2.4 mg via INTRAVENOUS
  Administered 2021-06-01: 0.6 mg via INTRAVENOUS
  Administered 2021-06-01: 0.3 mg via INTRAVENOUS
  Administered 2021-06-01: 3 mg via INTRAVENOUS
  Administered 2021-06-02: 1.8 mg via INTRAVENOUS
  Administered 2021-06-02: 0.3 mg via INTRAVENOUS
  Filled 2021-05-31: qty 30

## 2021-05-31 MED ORDER — HYDROMORPHONE HCL 2 MG/ML IJ SOLN
2.0000 mg | INTRAMUSCULAR | Status: AC
  Administered 2021-05-31: 2 mg via INTRAVENOUS

## 2021-05-31 MED ORDER — FOLIC ACID 1 MG PO TABS
1.0000 mg | ORAL_TABLET | Freq: Every day | ORAL | Status: DC
  Administered 2021-05-31 – 2021-06-02 (×3): 1 mg via ORAL
  Filled 2021-05-31 (×3): qty 1

## 2021-05-31 MED ORDER — GABAPENTIN 400 MG PO CAPS
400.0000 mg | ORAL_CAPSULE | Freq: Three times a day (TID) | ORAL | Status: DC
  Administered 2021-05-31 – 2021-06-02 (×7): 400 mg via ORAL
  Filled 2021-05-31 (×7): qty 1

## 2021-05-31 MED ORDER — ENOXAPARIN SODIUM 40 MG/0.4ML IJ SOSY
40.0000 mg | PREFILLED_SYRINGE | INTRAMUSCULAR | Status: DC
  Administered 2021-05-31 – 2021-06-02 (×3): 40 mg via SUBCUTANEOUS
  Filled 2021-05-31 (×3): qty 0.4

## 2021-05-31 MED ORDER — NALOXONE HCL 0.4 MG/ML IJ SOLN: 0.4000 mg | INTRAMUSCULAR | Status: DC | PRN

## 2021-05-31 MED ORDER — FLUCONAZOLE 150 MG PO TABS
150.0000 mg | ORAL_TABLET | Freq: Once | ORAL | Status: AC
  Administered 2021-05-31: 150 mg via ORAL
  Filled 2021-05-31: qty 1

## 2021-05-31 MED ORDER — DEXTROSE-NACL 5-0.45 % IV SOLN: INTRAVENOUS | Status: DC

## 2021-05-31 MED ORDER — HYDROMORPHONE HCL 2 MG/ML IJ SOLN
2.0000 mg | INTRAMUSCULAR | Status: AC | PRN
  Administered 2021-05-31: 2 mg via INTRAVENOUS
  Filled 2021-05-31: qty 1

## 2021-05-31 MED ORDER — KETOROLAC TROMETHAMINE 15 MG/ML IJ SOLN
15.0000 mg | Freq: Four times a day (QID) | INTRAMUSCULAR | Status: DC
  Administered 2021-05-31 – 2021-06-02 (×8): 15 mg via INTRAVENOUS
  Filled 2021-05-31 (×8): qty 1

## 2021-05-31 MED ORDER — SENNOSIDES-DOCUSATE SODIUM 8.6-50 MG PO TABS
1.0000 | ORAL_TABLET | Freq: Two times a day (BID) | ORAL | Status: DC
  Administered 2021-05-31 – 2021-06-02 (×5): 1 via ORAL
  Filled 2021-05-31 (×5): qty 1

## 2021-05-31 MED ORDER — KETOROLAC TROMETHAMINE 30 MG/ML IJ SOLN
30.0000 mg | Freq: Once | INTRAMUSCULAR | Status: AC
  Administered 2021-05-31: 30 mg via INTRAVENOUS
  Filled 2021-05-31: qty 1

## 2021-05-31 MED ORDER — SODIUM CHLORIDE 0.9 % IV SOLN: 1.0000 g | INTRAVENOUS | Status: DC

## 2021-05-31 MED ORDER — POLYETHYLENE GLYCOL 3350 17 G PO PACK: 17.0000 g | PACK | Freq: Every day | ORAL | Status: DC | PRN

## 2021-05-31 MED ORDER — DIPHENHYDRAMINE HCL 50 MG/ML IJ SOLN
25.0000 mg | Freq: Once | INTRAMUSCULAR | Status: AC
  Administered 2021-05-31: 25 mg via INTRAVENOUS
  Filled 2021-05-31: qty 1

## 2021-05-31 NOTE — ED Provider Notes (Signed)
Lindsey [name] Provider Note   CSN: [id number] Arrival date & time: 05/30/21  2349     History  Chief Complaint  Patient presents with   Sickle Cell Pain Crisis    Lindsey [name] is a 51 y.o. female.  HPI 51 year old female history of sickle cell, opioid abuse, major depressive disorder presents to the ER with complaints of sickle cell crisis.  Patient complains of pain in bilateral arms and shoulder blades which is consistent with her prior crises.  Pain is sharp and stabbing, waxes and wanes.  She has taken her home oxycodone with little relief.  Recently admitted to the hospital at the beginning of the month with sickle cell crisis.  She denies any fevers, vomiting, abdominal pain, chest pain, shortness of breath, chest pain, leg swelling    Home Medications Prior to Admission medications   Medication Sig Start Date End Date Taking? Authorizing Provider  folic acid (FOLVITE) 1 MG tablet Take 1 tablet (1 mg total) by mouth daily. 03/30/21 03/30/22 Yes Dorena Dew, FNP  gabapentin (NEURONTIN) 400 MG capsule TAKE 1 CAPSULE(400 MG) BY MOUTH THREE TIMES DAILY 05/20/21  Yes Dorena Dew, FNP  ibuprofen (ADVIL) 800 MG tablet Take 1 tablet (800 mg total) by mouth every 8 (eight) hours as needed. Patient taking differently: Take 800 mg by mouth every 8 (eight) hours as needed for moderate pain. 05/20/21  Yes Dorena Dew, FNP  Oxycodone HCl 10 MG TABS Take 1 tablet (10 mg total) by mouth every 6 (six) hours as needed for up to 15 days (pain). 05/25/21 06/09/21 Yes Dorena Dew, FNP      Allergies    Ketamine    Review of Systems   Review of Systems Ten systems reviewed and are negative for acute change, except as noted in the HPI.   Physical Exam Updated Vital Signs BP 115/67    Pulse 84    Temp 98.3 F (36.8 C) (Oral)    Resp 12    Ht 5' (1.524 m)    Wt 61.7 kg    SpO2 95%    BMI 26.56 kg/m  Physical Exam Vitals and nursing  note reviewed.  Constitutional:      General: She is not in acute distress.    Appearance: She is well-developed.     Comments: Uncomfortable appearing, tearful  HENT:     Head: Normocephalic and atraumatic.  Eyes:     Conjunctiva/sclera: Conjunctivae normal.  Cardiovascular:     Rate and Rhythm: Normal rate and regular rhythm.     Heart sounds: No murmur heard. Pulmonary:     Effort: Pulmonary effort is normal. No respiratory distress.     Breath sounds: Normal breath sounds.  Abdominal:     Palpations: Abdomen is soft.     Tenderness: There is no abdominal tenderness.  Musculoskeletal:        General: No swelling.     Cervical back: Neck supple.  Skin:    General: Skin is warm and dry.     Capillary Refill: Capillary refill takes less than 2 seconds.  Neurological:     General: No focal deficit present.     Mental Status: She is alert and oriented to person, place, and time.  Psychiatric:        Mood and Affect: Mood normal.    ED Results / Procedures / Treatments   Labs (all labs ordered are listed, but only abnormal results are displayed)  Labs Reviewed  COMPREHENSIVE METABOLIC PANEL - Abnormal; Notable for the following components:      Result Value   Total Bilirubin 1.3 (*)    All other components within normal limits  CBC WITH DIFFERENTIAL/PLATELET - Abnormal; Notable for the following components:   WBC 12.3 (*)    RBC 3.23 (*)    Hemoglobin 10.1 (*)    HCT 27.3 (*)    MCHC 37.0 (*)    Platelets 401 (*)    nRBC 0.4 (*)    Lymphs Abs 4.1 (*)    All other components within normal limits  RETICULOCYTES - Abnormal; Notable for the following components:   Retic Ct Pct 3.6 (*)    RBC. 3.25 (*)    Immature Retic Fract 40.7 (*)    All other components within normal limits  RESP PANEL BY RT-PCR (FLU A&B, COVID) [id number]  URINALYSIS, ROUTINE W REFLEX MICROSCOPIC  I-STAT BETA HCG BLOOD, ED ([facility], [facility], [facility] ONLY)    EKG None  Radiology No results  found.  Procedures Procedures    Medications Ordered in ED Medications  HYDROmorphone (DILAUDID) injection 2 mg ( Intravenous Canceled Entry 05/31/21 0207)  ondansetron (ZOFRAN) injection 4 mg (4 mg Intravenous Given 05/31/21 0157)  HYDROmorphone (DILAUDID) injection 2 mg (2 mg Intravenous Given 05/31/21 0156)  diphenhydrAMINE (BENADRYL) injection 25 mg (25 mg Intravenous Given 05/31/21 0157)  ketorolac (TORADOL) 30 MG/ML injection 30 mg (30 mg Intravenous Given 05/31/21 0227)  HYDROmorphone (DILAUDID) injection 2 mg (2 mg Intravenous Given 05/31/21 0227)    ED Course/ Medical Decision Making/ A&P                           Medical Decision Making Amount and/or Complexity of Data Reviewed Labs: ordered.  Risk Prescription drug management.   This patient presents to the ED for concern of sickle cell crises,  which involves an extensive number of treatment options, and is a complaint that carries with it a high risk of complications and morbidity.  The differential diagnosis includes sickle cell crisis, acute chest syndrome, PE, DVT , ACS     Co morbidities that complicate the patient evaluation  Sickle cell crisis, h/o opiod abuse    Additional history obtained:  Additional history obtained from  chart review    Lab Tests:  I Ordered, and personally interpreted labs.  The pertinent results include: CBC with a leukocytosis of 12.3, hemoglobin 10.1, CMP largely unremarkable, mildly elevated bilirubin of 1.3.  Immature reticulocyte count of 40.7, and reticulocyte count percentage of 3.6   Imaging Studies ordered:  I ordered imaging studies including: none I independently visualized and interpreted imaging which showed:  I agree with the radiologist interpretation   Cardiac Monitoring:  No EKG indicated given no CP    Medicines ordered and prescription drug management:  I ordered medication including dilaudid, toradol, zofran, benadryl Reevaluation of the patient after  these medicines showed that the patient stayed the same I have reviewed the patients home medicines and have made adjustments as needed   Test Considered:  Considered chest x-ray and EKG, however patient denies any chest pain, shortness of breath, or any other signs indicative of PE or acute chest syndrome   Consultations Obtained:  Hospitalist for admission    Problem List / ED Course:  51 year old female who presented with sickle cell crisis.  Pain in bilateral arms and between her shoulder blades which is consistent with her ACS presentation.  On arrival she is very uncomfortable appearing, however vitals are stable.  Lab work largely unremarkable, patient denies any chest pain, no tachycardia, not suggestive of acute chest or PE at this time.  She received multiple rounds of Dilaudid, Toradol, Zofran with no improvement in her pain.  She wishes to be admitted for further pain management.  Will consult hospitalist team.   Reevaluation:  After the interventions noted above, I reevaluated the patient and found that they have :stayed the same   Dispostion:  After consideration of the diagnostic results and the patients response to treatment, I feel that the patent would benefit from admission for further pain management   Final Clinical Impression(s) / ED Diagnoses Final diagnoses:  Sickle cell pain crisis [facility])    Rx / [place] Orders ED Discharge Orders     None         Garald Balding, PA-C 05/31/21 [id number]    Maudie Flakes, MD 05/31/21 [phone]

## 2021-05-31 NOTE — Progress Notes (Signed)
Lindsey [name] is a 51 year old female with a medical history significant for sickle cell disease, chronic pain syndrome, opiate dependence and tolerance, and history of anemia of chronic disease that was admitted earlier this a.m. for sickle cell pain crisis. Patient is complaining of dysuria, urinary odor, and urinary frequency over the past several days.  Patient is also complaining of low back and lower extremity pain that is consistent with her typical pain crisis.  She rates her pain as 9/10. She denies any headache, chest pain, nausea, vomiting, or diarrhea.   Care plan: Urinalysis shows large leukocytes, many bacteria, and turbid in appearance.  Also, budding yeast.  Ceftriaxone 1 g every 24 hours for UTI Diflucan 150 mg p.o. x1  Will reassess patient in AM.   [place], MSN, FNP-C [facility] [address] [place], [place] [address] [phone]

## 2021-05-31 NOTE — H&P (Signed)
History and Physical    Lindsey [name] MWU:[id number] DOB: 07/28/1970 DOA: 05/31/2021  PCP: Massie Maroon, FNP  Patient coming from: Home.  Chief Complaint: Neck pain.  HPI: Lindsey [name] is a 51 y.o. female with history of sickle cell anemia presents to the ER because of low back pain typical of her sickle cell pain crisis which has been ongoing for the last 2 to 3 days.  Denies any dysuria nausea vomiting fever chills chest pain or shortness of breath.  ED Course: In the ER patient labs appears to be at baseline.  Despite giving multiple doses of pain relief medication patient was still in pain admitted for sickle cell pain crisis.  COVID test is negative.  Review of Systems: As per HPI, rest all negative.   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   Opioid dependence in remission (HCC)    Sickle cell crisis [facility])     Past Surgical History:  Procedure Laterality Date   APPENDECTOMY     CESAREAN SECTION     OTHER SURGICAL HISTORY     c-section     reports that she has been smoking cigars and cigarettes. She has never used smokeless tobacco. She reports current alcohol use. She reports that she does not use drugs.  Allergies  Allergen Reactions   Ketamine Anxiety    Anxiety, elevated HR    Family History  Problem Relation Age of Onset   Stroke Neg Hx        none that she knows of    Seizures Neg Hx     Prior to Admission medications   Medication Sig Start Date End Date Taking? Authorizing Provider  folic acid (FOLVITE) 1 MG tablet Take 1 tablet (1 mg total) by mouth daily. 03/30/21 03/30/22 Yes Massie Maroon, FNP  gabapentin (NEURONTIN) 400 MG capsule TAKE 1 CAPSULE(400 MG) BY MOUTH THREE TIMES DAILY 05/20/21  Yes Massie Maroon, FNP  ibuprofen (ADVIL) 800 MG tablet Take 1 tablet (800 mg total) by mouth every 8 (eight) hours as needed. Patient taking differently: Take 800 mg by mouth every 8 (eight)  hours as needed for moderate pain. 05/20/21  Yes Massie Maroon, FNP  Oxycodone HCl 10 MG TABS Take 1 tablet (10 mg total) by mouth every 6 (six) hours as needed for up to 15 days (pain). 05/25/21 06/09/21 Yes Massie Maroon, FNP    Physical Exam: Constitutional: Moderately built and nourished. Vitals:   05/31/21 0245 05/31/21 0300 05/31/21 0400 05/31/21 0546  BP: 110/68 115/67 106/60 (!) 110/56  Pulse: 84 84 74 78  Resp:  12 11 20   Temp:    97.7 F (36.5 C)  TempSrc:    Oral  SpO2: 96% 95% 96% 99%  Weight:      Height:       Eyes: Anicteric no pallor. ENMT: No discharge from the ears eyes nose and mouth. Neck: No mass felt.  No neck rigidity. Respiratory: No rhonchi or crepitations. Cardiovascular: S1-S2 heard. Abdomen: Soft nontender bowel sound present. Musculoskeletal: No edema. Skin: No rash. Neurologic: Alert awake oriented time place and person.  Moves all extremities. Psychiatric: Appears normal.  Normal affect.   Labs on Admission: I have personally reviewed following labs and imaging studies  CBC: Recent Labs  Lab 05/31/21 0200  WBC 12.3*  NEUTROABS 6.8  HGB 10.1*  HCT 27.3*  MCV 84.5  PLT 401*   Basic Metabolic Panel:  Recent Labs  Lab 05/31/21 0200  NA 140  K 4.0  CL 108  CO2 25  GLUCOSE 98  BUN 9  CREATININE 0.76  CALCIUM 8.9   GFR: Estimated Creatinine Clearance: 69.8 mL/min (by C-G formula based on SCr of 0.76 mg/dL). Liver Function Tests: Recent Labs  Lab 05/31/21 0200  AST 17  ALT 14  ALKPHOS 50  BILITOT 1.3*  PROT 7.0  ALBUMIN 4.0   No results for input(s): LIPASE, AMYLASE in the last 168 hours. No results for input(s): AMMONIA in the last 168 hours. Coagulation Profile: No results for input(s): INR, PROTIME in the last 168 hours. Cardiac Enzymes: No results for input(s): CKTOTAL, CKMB, CKMBINDEX, TROPONINI in the last 168 hours. BNP (last 3 results) No results for input(s): PROBNP in the last 8760 hours. HbA1C: No  results for input(s): HGBA1C in the last 72 hours. CBG: No results for input(s): GLUCAP in the last 168 hours. Lipid Profile: No results for input(s): CHOL, HDL, LDLCALC, TRIG, CHOLHDL, LDLDIRECT in the last 72 hours. Thyroid Function Tests: No results for input(s): TSH, T4TOTAL, FREET4, T3FREE, THYROIDAB in the last 72 hours. Anemia Panel: Recent Labs    05/31/21 0200  RETICCTPCT 3.6*   Urine analysis:    Component Value Date/Time   COLORURINE YELLOW 05/10/2021 1454   APPEARANCEUR HAZY (A) 05/10/2021 1454   LABSPEC 1.012 05/10/2021 1454   PHURINE 5.0 05/10/2021 1454   GLUCOSEU NEGATIVE 05/10/2021 1454   HGBUR NEGATIVE 05/10/2021 1454   BILIRUBINUR NEGATIVE 05/10/2021 1454   BILIRUBINUR neg 04/07/2020 1014   KETONESUR NEGATIVE 05/10/2021 1454   PROTEINUR NEGATIVE 05/10/2021 1454   UROBILINOGEN 0.2 04/07/2020 1014   UROBILINOGEN 1.0 12/25/2012 1755   NITRITE NEGATIVE 05/10/2021 1454   LEUKOCYTESUR LARGE (A) 05/10/2021 1454   Sepsis Labs: @LABRCNTIP (procalcitonin:4,lacticidven:4) ) Recent Results (from the past 240 hour(s))  Resp Panel by RT-PCR (Flu A&B, Covid) Nasopharyngeal Swab     Status: None   Collection Time: 05/31/21  4:03 AM   Specimen: Nasopharyngeal Swab; Nasopharyngeal(NP) swabs in vial transport medium  Result Value Ref Range Status   SARS Coronavirus 2 by RT PCR NEGATIVE NEGATIVE Final    Comment: (NOTE) SARS-CoV-2 target nucleic acids are NOT DETECTED.  The SARS-CoV-2 RNA is generally detectable in upper respiratory specimens during the acute phase of infection. The lowest concentration of SARS-CoV-2 viral copies this assay can detect is 138 copies/mL. A negative result does not preclude SARS-Cov-2 infection and should not be used as the sole basis for treatment or other patient management decisions. A negative result may occur with  improper specimen collection/handling, submission of specimen other than nasopharyngeal swab, presence of viral  mutation(s) within the areas targeted by this assay, and inadequate number of viral copies(<138 copies/mL). A negative result must be combined with clinical observations, patient history, and epidemiological information. The expected result is Negative.  Fact Sheet for Patients:  BloggerCourse.comhttps://www.fda.gov/media/152166/download  Fact Sheet for Healthcare Providers:  SeriousBroker.ithttps://www.fda.gov/media/152162/download  This test is no t yet approved or cleared by the [place] FDA and  has been authorized for detection and/or diagnosis of SARS-CoV-2 by FDA under an Emergency Use Authorization (EUA). This EUA will remain  in effect (meaning this test can be used) for the duration of the COVID-19 declaration under Section 564(b)(1) of the Act, 21 U.S.C.section 360bbb-3(b)(1), unless the authorization is terminated  or revoked sooner.       Influenza A by PCR NEGATIVE NEGATIVE Final   Influenza B by PCR NEGATIVE NEGATIVE Final  Comment: (NOTE) The Xpert Xpress SARS-CoV-2/FLU/RSV plus assay is intended as an aid in the diagnosis of influenza from Nasopharyngeal swab specimens and should not be used as a sole basis for treatment. Nasal washings and aspirates are unacceptable for Xpert Xpress SARS-CoV-2/FLU/RSV testing.  Fact Sheet for Patients: BloggerCourse.com  Fact Sheet for Healthcare Providers: SeriousBroker.it  This test is not yet approved or cleared by the [place] FDA and has been authorized for detection and/or diagnosis of SARS-CoV-2 by FDA under an Emergency Use Authorization (EUA). This EUA will remain in effect (meaning this test can be used) for the duration of the COVID-19 declaration under Section 564(b)(1) of the Act, 21 U.S.C. section 360bbb-3(b)(1), unless the authorization is terminated or revoked.  Performed at [facility], [address], [place], [place] [address]      Radiological Exams  on Admission: No results found.    Assessment/Plan Principal Problem:   Sickle cell pain crisis (HCC)    Sickle cell pain crisis for which I have placed patient on Dilaudid PCA.  Toradol IV.  IV fluids.  Continue patient's gabapentin. Leukocytosis likely reactionary.  No signs of infection.  UA is pending. Anemia hemoglobin at baseline follow CBC.  Since patient has sickle cell pain crisis requiring Dilaudid PCA will need inpatient status.   DVT prophylaxis: Lovenox. Code Status: Full code. Family Communication: Discussed with patient. Disposition Plan: Home. Consults called: None. Admission status: Inpatient.   Eduard Clos MD [facility] Pager [phone].  If 7PM-7AM, please contact night-coverage [email or web address] Password [email or web address]  05/31/2021, 6:00 AM

## 2021-05-31 NOTE — ED Triage Notes (Signed)
SCC pain worst in left arm and lower back. Began today around 4PM. Took 10mg  oxycodone with no improvement.

## 2021-06-01 DIAGNOSIS — D5700 Hb-SS disease with crisis, unspecified: Secondary | ICD-10-CM | POA: Diagnosis not present

## 2021-06-01 LAB — URINE CULTURE

## 2021-06-01 MED ORDER — SODIUM CHLORIDE 0.9 % IV SOLN
1.0000 g | INTRAVENOUS | Status: DC
  Administered 2021-06-01 – 2021-06-02 (×2): 1 g via INTRAVENOUS
  Filled 2021-06-01 (×2): qty 10

## 2021-06-01 NOTE — Progress Notes (Signed)
Subjective: Lindsey [name] is a 51 year old female with a medical history significant for sickle cell disease, chronic pain syndrome, opiate dependence and tolerance, and history of anemia of chronic disease that was admitted with sickle cell pain crisis. Patient states that pain intensity is somewhat improved on today.  She continues to have pain primarily to lower extremities.  She rates her pain as 5/10.  She denies any headache, chest pain, nausea, vomiting, or diarrhea.  Patient denies any dysuria or hematuria today.  Urinary symptoms have improved.  Objective:  Vital signs in last 24 hours:  Vitals:   06/01/21 1009 06/01/21 1051 06/01/21 1241 06/01/21 1536  BP: 102/66 (!) 170/86 104/64   Pulse: 63 (!) 141 63   Resp:  18 16 16   Temp:  97.7 F (36.5 C) 97.9 F (36.6 C)   TempSrc:      SpO2:  98% 98% 96%  Weight:      Height:        Intake/Output from previous day:   Intake/Output Summary (Last 24 hours) at 06/01/2021 1551 Last data filed at 06/01/2021 0300 Gross per 24 hour  Intake 1167.71 ml  Output --  Net 1167.71 ml    Physical Exam: General: Alert, awake, oriented x3, in no acute distress.  HEENT: [place]/AT PEERL, EOMI Neck: Trachea midline,  no masses, no thyromegal,y no JVD, no carotid bruit OROPHARYNX:  Moist, No exudate/ erythema/lesions.  Heart: Regular rate and rhythm, without murmurs, rubs, gallops, PMI non-displaced, no heaves or thrills on palpation.  Lungs: Clear to auscultation, no wheezing or rhonchi noted. No increased vocal fremitus resonant to percussion  Abdomen: Soft, nontender, nondistended, positive bowel sounds, no masses no hepatosplenomegaly noted..  Neuro: No focal neurological deficits noted cranial nerves II through XII grossly intact. DTRs 2+ bilaterally upper and lower extremities. Strength 5 out of 5 in bilateral upper and lower extremities. Musculoskeletal: No warm swelling or erythema around joints, no spinal tenderness noted. Psychiatric:  Patient alert and oriented x3, good insight and cognition, good recent to remote recall. Lymph node survey: No cervical axillary or inguinal lymphadenopathy noted.  Lab Results:  Basic Metabolic Panel:    Component Value Date/Time   NA 140 05/31/2021 0200   NA 142 05/20/2021 1101   K 4.0 05/31/2021 0200   CL 108 05/31/2021 0200   CO2 25 05/31/2021 0200   BUN 9 05/31/2021 0200   BUN 10 05/20/2021 1101   CREATININE 0.76 05/31/2021 0200   GLUCOSE 98 05/31/2021 0200   CALCIUM 8.9 05/31/2021 0200   CBC:    Component Value Date/Time   WBC 12.3 (H) 05/31/2021 0200   HGB 10.1 (L) 05/31/2021 0200   HGB 11.0 (L) 05/20/2021 1101   HCT 27.3 (L) 05/31/2021 0200   HCT 31.3 (L) 05/20/2021 1101   PLT 401 (H) 05/31/2021 0200   PLT 509 (H) 05/20/2021 1101   MCV 84.5 05/31/2021 0200   MCV 88 05/20/2021 1101   NEUTROABS 6.8 05/31/2021 0200   NEUTROABS 5.4 05/20/2021 1101   LYMPHSABS 4.1 (H) 05/31/2021 0200   LYMPHSABS 2.5 05/20/2021 1101   MONOABS 0.8 05/31/2021 0200   EOSABS 0.5 05/31/2021 0200   EOSABS 0.3 05/20/2021 1101   BASOSABS 0.1 05/31/2021 0200   BASOSABS 0.1 05/20/2021 1101    Recent Results (from the past 240 hour(s))  Resp Panel by RT-PCR (Flu A&B, Covid) Nasopharyngeal Swab     Status: None   Collection Time: 05/31/21  4:03 AM   Specimen: Nasopharyngeal Swab; Nasopharyngeal(NP) swabs in  vial transport medium  Result Value Ref Range Status   SARS Coronavirus 2 by RT PCR NEGATIVE NEGATIVE Final    Comment: (NOTE) SARS-CoV-2 target nucleic acids are NOT DETECTED.  The SARS-CoV-2 RNA is generally detectable in upper respiratory specimens during the acute phase of infection. The lowest concentration of SARS-CoV-2 viral copies this assay can detect is 138 copies/mL. A negative result does not preclude SARS-Cov-2 infection and should not be used as the sole basis for treatment or other patient management decisions. A negative result may occur with  improper specimen  collection/handling, submission of specimen other than nasopharyngeal swab, presence of viral mutation(s) within the areas targeted by this assay, and inadequate number of viral copies(<138 copies/mL). A negative result must be combined with clinical observations, patient history, and epidemiological information. The expected result is Negative.  Fact Sheet for Patients:  EntrepreneurPulse.com.au  Fact Sheet for Healthcare Providers:  IncredibleEmployment.be  This test is no t yet approved or cleared by the [place] FDA and  has been authorized for detection and/or diagnosis of SARS-CoV-2 by FDA under an Emergency Use Authorization (EUA). This EUA will remain  in effect (meaning this test can be used) for the duration of the COVID-19 declaration under Section 564(b)(1) of the Act, 21 U.S.C.section 360bbb-3(b)(1), unless the authorization is terminated  or revoked sooner.       Influenza A by PCR NEGATIVE NEGATIVE Final   Influenza B by PCR NEGATIVE NEGATIVE Final    Comment: (NOTE) The Xpert Xpress SARS-CoV-2/FLU/RSV plus assay is intended as an aid in the diagnosis of influenza from Nasopharyngeal swab specimens and should not be used as a sole basis for treatment. Nasal washings and aspirates are unacceptable for Xpert Xpress SARS-CoV-2/FLU/RSV testing.  Fact Sheet for Patients: EntrepreneurPulse.com.au  Fact Sheet for Healthcare Providers: IncredibleEmployment.be  This test is not yet approved or cleared by the [place] FDA and has been authorized for detection and/or diagnosis of SARS-CoV-2 by FDA under an Emergency Use Authorization (EUA). This EUA will remain in effect (meaning this test can be used) for the duration of the COVID-19 declaration under Section 564(b)(1) of the Act, 21 U.S.C. section 360bbb-3(b)(1), unless the authorization is terminated or revoked.  Performed at [facility], [place] [address] [place], [place] [address]   MRSA Next Gen by PCR, Nasal     Status: Abnormal   Collection Time: 05/31/21  6:00 AM   Specimen: Nasal Mucosa; Nasal Swab  Result Value Ref Range Status   MRSA by PCR Next Gen DETECTED (A) NOT DETECTED Final    Comment: RESULT CALLED TO, READ BACK BY AND VERIFIED WITH: LYNCH, M. RN ON 05/31/2021 @ 0823 BY MECIAL J. (NOTE) The GeneXpert MRSA Assay (FDA approved for NASAL specimens only), is one component of a comprehensive MRSA colonization surveillance program. It is not intended to diagnose MRSA infection nor to guide or monitor treatment for MRSA infections. Test performance is not FDA approved in patients less than 10 years old. Performed at [facility], [place] [address] [place], [place] [address]   Urine Culture     Status: Abnormal   Collection Time: 05/31/21  1:50 PM   Specimen: Urine, Clean Catch  Result Value Ref Range Status   Specimen Description   Final    URINE, CLEAN CATCH Performed at [facility], [place] [address], [place], [place] [address]    Special Requests   Final    NONE Performed at [facility], [place] [address] [place], [place] [address]    Culture MULTIPLE SPECIES PRESENT, SUGGEST RECOLLECTION (A)  Final   Report Status 06/01/2021 FINAL  Final    Studies/Results: No results found.  Medications: Scheduled Meds:  enoxaparin (LOVENOX) injection  40 mg Subcutaneous [id number]   folic acid  1 mg Oral Daily   gabapentin  400 mg Oral TID   HYDROmorphone   Intravenous Q4H   ketorolac  15 mg Intravenous Q6H   senna-docusate  1 tablet Oral BID   Continuous Infusions:  sodium chloride 100 mL/hr at 06/01/21 1244   cefTRIAXone (ROCEPHIN)  IV 1 g (06/01/21 1017)   PRN Meds:.diphenhydrAMINE, naloxone **AND** sodium chloride flush, ondansetron (ZOFRAN) IV, polyethylene glycol  Consultants: none  Procedures: none  Antibiotics: IV  ceftriaxone  Assessment/Plan: Principal Problem:   Sickle cell pain crisis (HCC)  Sickle cell disease with pain crisis: Continue IV fluids, 0.45% saline at 100 mL/h Weaning IV Dilaudid PCA Oxycodone 10 mg every 4 hours as needed for severe breakthrough pain Monitor vital signs very closely, reevaluate pain scale regularly, and supplemental oxygen as needed  Chronic pain syndrome: Continue home medications  Anemia of chronic disease: Hemoglobin is stable and consistent with patient's baseline, there is no clinical indication for blood transfusion at this time.  Continue to monitor closely.  Urinary tract infection: Patient says that symptoms are improved on today.  Continue IV ceftriaxone.  Follow urine culture.  Code Status: Full Code Family Communication: N/A Disposition Plan: Not yet ready for discharge   [place], MSN, FNP-C Patient [place] [address] [place], [place] [address] [phone]  If 5PM-8AM, please contact night-coverage.  06/01/2021, 3:51 PM  LOS: 1 day

## 2021-06-01 NOTE — Progress Notes (Signed)
°   06/01/21 0118  Vitals  Temp 98.9 F (37.2 C)  Temp Source Oral  BP (!) 89/63  MAP (mmHg) 72  BP Location Left Arm  BP Method Automatic  Patient Position (if appropriate) Lying  Pulse Rate 67  Pulse Rate Source Monitor  Resp 16  MEWS COLOR  MEWS Score Color Green  Oxygen Therapy  SpO2 99 %  O2 Device Room Air  MEWS Score  MEWS Temp 0  MEWS Systolic 1  MEWS Pulse 0  MEWS RR 0  MEWS LOC 0  MEWS Score 1  Provider Notification  Provider Name/Title J. Garner Nash NP  Date Provider Notified 06/01/21  Time Provider Notified 0130  Notification Type Page  Notification Reason Other (Comment) (BP 89/63)  Provider response No new orders  Date of Provider Response 06/01/21  Time of Provider Response 0145

## 2021-06-02 LAB — CBC
HCT: 23.4 % — ABNORMAL LOW (ref 36.0–46.0)
Hemoglobin: 8.9 g/dL — ABNORMAL LOW (ref 12.0–15.0)
MCH: 31.4 pg (ref 26.0–34.0)
MCHC: 38 g/dL — ABNORMAL HIGH (ref 30.0–36.0)
MCV: 82.7 fL (ref 80.0–100.0)
Platelets: UNDETERMINED 10*3/uL (ref 150–400)
RBC: 2.83 MIL/uL — ABNORMAL LOW (ref 3.87–5.11)
RDW: 14.9 % (ref 11.5–15.5)
WBC: 9.9 10*3/uL (ref 4.0–10.5)
nRBC: 0.6 % — ABNORMAL HIGH (ref 0.0–0.2)

## 2021-06-02 MED ORDER — OXYCODONE HCL ER 10 MG PO T12A: 10.0000 mg | EXTENDED_RELEASE_TABLET | Freq: Two times a day (BID) | ORAL | 0 refills | Status: DC

## 2021-06-02 MED ORDER — MUPIROCIN 2 % EX OINT: 1.0000 "application " | TOPICAL_OINTMENT | Freq: Two times a day (BID) | CUTANEOUS | Status: DC

## 2021-06-02 MED ORDER — CHLORHEXIDINE GLUCONATE CLOTH 2 % EX PADS: 6.0000 | MEDICATED_PAD | Freq: Every day | CUTANEOUS | Status: DC

## 2021-06-02 NOTE — Discharge Summary (Signed)
Physician Discharge Summary  Lindsey [name] [id number] DOB: 1970-09-10 DOA: 05/31/2021  PCP: Dorena Dew, FNP  Admit date: 05/31/2021  Discharge date: 06/02/2021  Discharge Diagnoses:  Principal Problem:   Sickle cell pain crisis [facility])   Discharge Condition: Stable  Disposition:   Follow-up Information     Dorena Dew, FNP Follow up.   Specialty: Family Medicine Contact information: [place]. [place] [address] [phone]                Pt is discharged home in good condition and is to follow up with Dorena Dew, FNP this week to have labs evaluated. Lindsey [name] is instructed to increase activity slowly and balance with rest for the next few days, and use prescribed medication to complete treatment of pain  Diet: Regular Wt Readings from Last 3 Encounters:  05/31/21 61.7 kg  05/20/21 61.8 kg  05/09/21 59.7 kg    History of present illness:  Lindsey [name] is a 51 year old female with a medical history significant for sickle cell anemia, chronic pain syndrome, opiate dependence and tolerance, and history of anemia of chronic disease presented to the ER due to low back pain that is typical of her sickle cell pain crisis.  Sickle cell pain crisis have been ongoing for the past 2 to 3 days.  She denies any dysuria, nausea, vomiting, fever, chills, chest pain, or shortness of breath.  ER course: In the ER, patient's labs appear to be at baseline.  Despite given multiple doses of pain relief medications, patient was still in pain and admitted for sickle cell pain crisis.  COVID-19 test is negative.  Hospital Course:  Sickle cell disease with pain crisis: Patient was admitted for sickle cell pain crisis and managed appropriately with IVF, IV Dilaudid via PCA and IV Toradol, as well as other adjunct therapies per sickle cell pain management protocols.  Patient's pain intensity decreased to 4/10 and she is requesting  discharge home.  Medications reviewed and patient's will follow-up with PCP for continued medication management.  She will also follow-up within 2 weeks to repeat CBC with differential and CMP.  During admission, patient complained of urinary symptoms including dysuria, frequency, and urgency.  Urinalysis reviewed, large leukocytes, large bacteria, and yeast present.  Diflucan 150 mg x 1 was prescribed for yeast.  Ceftriaxone 1 g was initiated at that time.  Urine culture did not yield any dominant bacterial growth, multiple species present.  No further antibiotics warranted at discharge.  Patient is afebrile and oxygen saturation is greater than 90% on RA. Patient is alert, oriented, and ambulating without assistance. Patient was therefore discharged home today in a hemodynamically stable condition.   Lindsey [name] will follow-up with PCP within 1 week of this discharge. Lindsey [name] was counseled extensively about nonpharmacologic means of pain management, patient verbalized understanding and was appreciative of  the care received during this admission.   We discussed the need for good hydration, monitoring of hydration status, avoidance of heat, cold, stress, and infection triggers. We discussed the need to be adherent with taking home medications. Patient was reminded of the need to seek medical attention immediately if any symptom of bleeding, anemia, or infection occurs.  Discharge Exam: Vitals:   06/02/21 0722 06/02/21 1054  BP:  (!) 110/58  Pulse:  63  Resp: 16 14  Temp:  98.3 F (36.8 C)  SpO2: 97% 98%   Vitals:   06/02/21 0137 06/02/21 0434 06/02/21 [id number]  06/02/21 1054  BP: 99/69 (!) 97/59  (!) 110/58  Pulse: 68 65  63  Resp: 18 16 16 14   Temp: 98.3 F (36.8 C) 98.3 F (36.8 C)  98.3 F (36.8 C)  TempSrc: Oral Oral  Oral  SpO2: 100% 96% 97% 98%  Weight:      Height:        General appearance : Awake, alert, not in any distress. Speech Clear. Not toxic looking HEENT: Atraumatic  and Normocephalic, pupils equally reactive to light and accomodation Neck: Supple, no JVD. No cervical lymphadenopathy.  Chest: Good air entry bilaterally, no added sounds  CVS: S1 S2 regular, no murmurs.  Abdomen: Bowel sounds present, Non tender and not distended with no gaurding, rigidity or rebound. Extremities: B/L Lower Ext shows no edema, both legs are warm to touch Neurology: Awake alert, and oriented X 3, CN II-XII intact, Non focal Skin: No Rash  Discharge Instructions  Discharge Instructions     Discharge patient   Complete by: As directed    Discharge disposition: 01-Home or Self Care   Discharge patient date: 06/02/2021      Allergies as of 06/02/2021       Reactions   Ketamine Anxiety   Anxiety, elevated HR        Medication List     TAKE these medications    folic acid 1 MG tablet Commonly known as: FOLVITE Take 1 tablet (1 mg total) by mouth daily.   gabapentin 400 MG capsule Commonly known as: NEURONTIN TAKE 1 CAPSULE(400 MG) BY MOUTH THREE TIMES DAILY   ibuprofen 800 MG tablet Commonly known as: ADVIL Take 1 tablet (800 mg total) by mouth every 8 (eight) hours as needed. What changed: reasons to take this   Oxycodone HCl 10 MG Tabs Take 1 tablet (10 mg total) by mouth every 6 (six) hours as needed for up to 15 days (pain). What changed: Another medication with the same name was added. Make sure you understand how and when to take each.   oxyCODONE 10 mg 12 hr tablet Commonly known as: OXYCONTIN Take 1 tablet (10 mg total) by mouth every 12 (twelve) hours. What changed: You were already taking a medication with the same name, and this prescription was added. Make sure you understand how and when to take each.        The results of significant diagnostics from this hospitalization (including imaging, microbiology, ancillary and laboratory) are listed below for reference.    Significant Diagnostic Studies: DG Chest 1 View  Result Date:  05/09/2021 CLINICAL DATA:  Sickle cell crisis. Pain in the left shoulder and lower leg. EXAM: CHEST  1 VIEW COMPARISON:  Two-view chest x-ray 03/10/2021 FINDINGS: Heart size is normal. Chronic interstitial coarsening is present in the lungs without underlying disease. Sclerotic changes in the humeral heads noted, left greater than right. Chronic bone changes in the thoracolumbar spine without acute abnormality. IMPRESSION: 1. No acute cardiopulmonary disease. 2. Sclerotic changes of the humeral heads, left greater than right. No acute osseous abnormality. Electronically Signed   By: San Morelle M.D.   On: 05/09/2021 18:10    Microbiology: Recent Results (from the past 240 hour(s))  Resp Panel by RT-PCR (Flu A&B, Covid) Nasopharyngeal Swab     Status: None   Collection Time: 05/31/21  4:03 AM   Specimen: Nasopharyngeal Swab; Nasopharyngeal(NP) swabs in vial transport medium  Result Value Ref Range Status   SARS Coronavirus 2 by RT PCR NEGATIVE NEGATIVE Final  Comment: (NOTE) SARS-CoV-2 target nucleic acids are NOT DETECTED.  The SARS-CoV-2 RNA is generally detectable in upper respiratory specimens during the acute phase of infection. The lowest concentration of SARS-CoV-2 viral copies this assay can detect is 138 copies/mL. A negative result does not preclude SARS-Cov-2 infection and should not be used as the sole basis for treatment or other patient management decisions. A negative result may occur with  improper specimen collection/handling, submission of specimen other than nasopharyngeal swab, presence of viral mutation(s) within the areas targeted by this assay, and inadequate number of viral copies(<138 copies/mL). A negative result must be combined with clinical observations, patient history, and epidemiological information. The expected result is Negative.  Fact Sheet for Patients:  EntrepreneurPulse.com.au  Fact Sheet for Healthcare Providers:   IncredibleEmployment.be  This test is no t yet approved or cleared by the [place] FDA and  has been authorized for detection and/or diagnosis of SARS-CoV-2 by FDA under an Emergency Use Authorization (EUA). This EUA will remain  in effect (meaning this test can be used) for the duration of the COVID-19 declaration under Section 564(b)(1) of the Act, 21 U.S.C.section 360bbb-3(b)(1), unless the authorization is terminated  or revoked sooner.       Influenza A by PCR NEGATIVE NEGATIVE Final   Influenza B by PCR NEGATIVE NEGATIVE Final    Comment: (NOTE) The Xpert Xpress SARS-CoV-2/FLU/RSV plus assay is intended as an aid in the diagnosis of influenza from Nasopharyngeal swab specimens and should not be used as a sole basis for treatment. Nasal washings and aspirates are unacceptable for Xpert Xpress SARS-CoV-2/FLU/RSV testing.  Fact Sheet for Patients: EntrepreneurPulse.com.au  Fact Sheet for Healthcare Providers: IncredibleEmployment.be  This test is not yet approved or cleared by the [place] FDA and has been authorized for detection and/or diagnosis of SARS-CoV-2 by FDA under an Emergency Use Authorization (EUA). This EUA will remain in effect (meaning this test can be used) for the duration of the COVID-19 declaration under Section 564(b)(1) of the Act, 21 U.S.C. section 360bbb-3(b)(1), unless the authorization is terminated or revoked.  Performed at [facility], [place] [address], [place], [place] [address]   MRSA Next Gen by PCR, Nasal     Status: Abnormal   Collection Time: 05/31/21  6:00 AM   Specimen: Nasal Mucosa; Nasal Swab  Result Value Ref Range Status   MRSA by PCR Next Gen DETECTED (A) NOT DETECTED Final    Comment: RESULT CALLED TO, READ BACK BY AND VERIFIED WITH: LYNCH, M. RN ON 05/31/2021 @ 0823 BY MECIAL J. (NOTE) The GeneXpert MRSA Assay (FDA approved for NASAL specimens  only), is one component of a comprehensive MRSA colonization surveillance program. It is not intended to diagnose MRSA infection nor to guide or monitor treatment for MRSA infections. Test performance is not FDA approved in patients less than 92 years old. Performed at [facility], [place] [address], [place], [place] [address]   Urine Culture     Status: Abnormal   Collection Time: 05/31/21  1:50 PM   Specimen: Urine, Clean Catch  Result Value Ref Range Status   Specimen Description   Final    URINE, CLEAN CATCH Performed at [facility], [place] [address] [place], [place] [address]    Special Requests   Final    NONE Performed at [facility], [place] [address] [place], [place] [address]    Culture MULTIPLE SPECIES PRESENT, SUGGEST RECOLLECTION (A)  Final  Report Status 06/01/2021 FINAL  Final     Labs: Basic Metabolic Panel: Recent Labs  Lab 05/31/21 0200  NA 140  K 4.0  CL 108  CO2 25  GLUCOSE 98  BUN 9  CREATININE 0.76  CALCIUM 8.9   Liver Function Tests: Recent Labs  Lab 05/31/21 0200  AST 17  ALT 14  ALKPHOS 50  BILITOT 1.3*  PROT 7.0  ALBUMIN 4.0   No results for input(s): LIPASE, AMYLASE in the last 168 hours. No results for input(s): AMMONIA in the last 168 hours. CBC: Recent Labs  Lab 05/31/21 0200 06/02/21 0849  WBC 12.3* 9.9  NEUTROABS 6.8  --   HGB 10.1* 8.9*  HCT 27.3* 23.4*  MCV 84.5 82.7  PLT 401* PLATELET CLUMPS NOTED ON SMEAR, UNABLE TO ESTIMATE   Cardiac Enzymes: No results for input(s): CKTOTAL, CKMB, CKMBINDEX, TROPONINI in the last 168 hours. BNP: Invalid input(s): POCBNP CBG: No results for input(s): GLUCAP in the last 168 hours.  Time coordinating discharge: 30 minutes  Signed:  [name]  APRN, MSN, FNP-C Patient [place], [place] [address] [phone]  [facility] 06/02/2021,  11:13 AM

## 2021-06-07 ENCOUNTER — Telehealth: Payer: Self-pay

## 2021-06-07 ENCOUNTER — Other Ambulatory Visit: Payer: Self-pay | Admitting: Family Medicine

## 2021-06-07 DIAGNOSIS — G894 Chronic pain syndrome: Secondary | ICD-10-CM

## 2021-06-07 MED ORDER — OXYCODONE HCL 10 MG PO TABS: 10.0000 mg | ORAL_TABLET | Freq: Four times a day (QID) | ORAL | 0 refills | Status: DC | PRN

## 2021-06-07 NOTE — Telephone Encounter (Signed)
Oxycodone  °

## 2021-06-07 NOTE — Progress Notes (Signed)
Reviewed PDMP substance reporting system prior to prescribing opiate medications. No inconsistencies noted.  Meds ordered this encounter  Medications   Oxycodone HCl 10 MG TABS    Sig: Take 1 tablet (10 mg total) by mouth every 6 (six) hours as needed for up to 15 days (pain).    Dispense:  60 tablet    Refill:  0    Order Specific Question:   Supervising Provider    Answer:   [name], [name]   Lindsey Seidner Moore Jameis Newsham  APRN, MSN, FNP-C [facility] [address]  [place], [place] [address] [phone]  

## 2021-06-19 ENCOUNTER — Encounter (HOSPITAL_COMMUNITY): Payer: Self-pay | Admitting: *Deleted

## 2021-06-19 ENCOUNTER — Observation Stay (HOSPITAL_COMMUNITY)
Admission: EM | Admit: 2021-06-19 | Discharge: 2021-06-21 | Disposition: A | Discharge status: Home / Self Care | Payer: Medicaid Other | Attending: Internal Medicine | Admitting: Internal Medicine

## 2021-06-19 ENCOUNTER — Other Ambulatory Visit: Payer: Self-pay

## 2021-06-19 DIAGNOSIS — F17210 Nicotine dependence, cigarettes, uncomplicated: Secondary | ICD-10-CM | POA: Diagnosis not present

## 2021-06-19 DIAGNOSIS — D571 Sickle-cell disease without crisis: Secondary | ICD-10-CM | POA: Diagnosis present

## 2021-06-19 DIAGNOSIS — Z20822 Contact with and (suspected) exposure to covid-19: Secondary | ICD-10-CM | POA: Diagnosis not present

## 2021-06-19 DIAGNOSIS — D5700 Hb-SS disease with crisis, unspecified: Secondary | ICD-10-CM | POA: Diagnosis not present

## 2021-06-19 DIAGNOSIS — G894 Chronic pain syndrome: Secondary | ICD-10-CM

## 2021-06-19 DIAGNOSIS — G8929 Other chronic pain: Secondary | ICD-10-CM | POA: Diagnosis present

## 2021-06-19 HISTORY — DX: Hypoxemia: R09.02

## 2021-06-19 HISTORY — DX: Pain in left shoulder: M25.512

## 2021-06-19 HISTORY — DX: Pain in right leg: M79.604

## 2021-06-19 LAB — CBC WITH DIFFERENTIAL/PLATELET
Abs Immature Granulocytes: 0.02 10*3/uL (ref 0.00–0.07)
Basophils Absolute: 0.1 10*3/uL (ref 0.0–0.1)
Basophils Relative: 1 %
Eosinophils Absolute: 0.6 10*3/uL — ABNORMAL HIGH (ref 0.0–0.5)
Eosinophils Relative: 6 %
HCT: 27.2 % — ABNORMAL LOW (ref 36.0–46.0)
Hemoglobin: 10.2 g/dL — ABNORMAL LOW (ref 12.0–15.0)
Immature Granulocytes: 0 %
Lymphocytes Relative: 45 %
Lymphs Abs: 4.3 10*3/uL — ABNORMAL HIGH (ref 0.7–4.0)
MCH: 32.1 pg (ref 26.0–34.0)
MCHC: 37.5 g/dL — ABNORMAL HIGH (ref 30.0–36.0)
MCV: 85.5 fL (ref 80.0–100.0)
Monocytes Absolute: 0.6 10*3/uL (ref 0.1–1.0)
Monocytes Relative: 6 %
Neutro Abs: 4.1 10*3/uL (ref 1.7–7.7)
Neutrophils Relative %: 42 %
Platelets: 476 10*3/uL — ABNORMAL HIGH (ref 150–400)
RBC: 3.18 MIL/uL — ABNORMAL LOW (ref 3.87–5.11)
RDW: 13.6 % (ref 11.5–15.5)
WBC: 9.7 10*3/uL (ref 4.0–10.5)
nRBC: 0.5 % — ABNORMAL HIGH (ref 0.0–0.2)

## 2021-06-19 LAB — RETICULOCYTES
Immature Retic Fract: 43.1 % — ABNORMAL HIGH (ref 2.3–15.9)
RBC.: 3.14 MIL/uL — ABNORMAL LOW (ref 3.87–5.11)
Retic Count, Absolute: 128.7 10*3/uL (ref 19.0–186.0)
Retic Ct Pct: 4.1 % — ABNORMAL HIGH (ref 0.4–3.1)

## 2021-06-19 LAB — BASIC METABOLIC PANEL
Anion gap: 6 (ref 5–15)
BUN: 12 mg/dL (ref 6–20)
CO2: 23 mmol/L (ref 22–32)
Calcium: 8.8 mg/dL — ABNORMAL LOW (ref 8.9–10.3)
Chloride: 110 mmol/L (ref 98–111)
Creatinine, Ser: 0.74 mg/dL (ref 0.44–1.00)
GFR, Estimated: >60 mL/min (ref >60)
Glucose, Bld: 104 mg/dL — ABNORMAL HIGH (ref 70–99)
Potassium: 4.3 mmol/L (ref 3.5–5.1)
Sodium: 139 mmol/L (ref 135–145)

## 2021-06-19 MED ORDER — OXYCODONE HCL ER 10 MG PO T12A
10.0000 mg | EXTENDED_RELEASE_TABLET | Freq: Two times a day (BID) | ORAL | Status: DC
  Administered 2021-06-20 – 2021-06-21 (×4): 10 mg via ORAL
  Filled 2021-06-19 (×4): qty 1

## 2021-06-19 MED ORDER — HYDROMORPHONE 1 MG/ML IV SOLN
INTRAVENOUS | Status: DC
  Administered 2021-06-20: 11 mg via INTRAVENOUS
  Administered 2021-06-20 (×2): 3 mg via INTRAVENOUS
  Administered 2021-06-20: 30 mg via INTRAVENOUS
  Administered 2021-06-21: 1 mg via INTRAVENOUS
  Administered 2021-06-21: 2 mg via INTRAVENOUS
  Filled 2021-06-19 (×3): qty 30

## 2021-06-19 MED ORDER — HYDROMORPHONE HCL 1 MG/ML IJ SOLN
1.0000 mg | INTRAMUSCULAR | Status: DC | PRN
  Administered 2021-06-20: 1 mg via INTRAVENOUS
  Filled 2021-06-19: qty 1

## 2021-06-19 MED ORDER — FOLIC ACID 1 MG PO TABS
1.0000 mg | ORAL_TABLET | Freq: Every day | ORAL | Status: DC
  Administered 2021-06-20 – 2021-06-21 (×2): 1 mg via ORAL
  Filled 2021-06-19 (×2): qty 1

## 2021-06-19 MED ORDER — POLYETHYLENE GLYCOL 3350 17 G PO PACK: 17.0000 g | PACK | Freq: Every day | ORAL | Status: DC | PRN

## 2021-06-19 MED ORDER — ENOXAPARIN SODIUM 40 MG/0.4ML IJ SOSY
40.0000 mg | PREFILLED_SYRINGE | INTRAMUSCULAR | Status: DC
  Administered 2021-06-20 – 2021-06-21 (×2): 40 mg via SUBCUTANEOUS
  Filled 2021-06-19 (×2): qty 0.4

## 2021-06-19 MED ORDER — NALOXONE HCL 0.4 MG/ML IJ SOLN: 0.4000 mg | INTRAMUSCULAR | Status: DC | PRN

## 2021-06-19 MED ORDER — KETOROLAC TROMETHAMINE 15 MG/ML IJ SOLN
15.0000 mg | Freq: Four times a day (QID) | INTRAMUSCULAR | Status: DC
  Administered 2021-06-20 – 2021-06-21 (×7): 15 mg via INTRAVENOUS
  Filled 2021-06-19 (×7): qty 1

## 2021-06-19 MED ORDER — HYDROMORPHONE HCL 2 MG/ML IJ SOLN
3.0000 mg | Freq: Once | INTRAMUSCULAR | Status: AC
  Administered 2021-06-19: 3 mg via INTRAMUSCULAR
  Filled 2021-06-19: qty 2

## 2021-06-19 MED ORDER — GABAPENTIN 400 MG PO CAPS
400.0000 mg | ORAL_CAPSULE | Freq: Three times a day (TID) | ORAL | Status: DC
  Administered 2021-06-20 – 2021-06-21 (×5): 400 mg via ORAL
  Filled 2021-06-19 (×5): qty 1

## 2021-06-19 MED ORDER — ONDANSETRON HCL 4 MG PO TABS: 4.0000 mg | ORAL_TABLET | ORAL | Status: DC | PRN

## 2021-06-19 MED ORDER — ONDANSETRON HCL 4 MG/2ML IJ SOLN
4.0000 mg | INTRAMUSCULAR | Status: DC | PRN
  Administered 2021-06-20: 4 mg via INTRAVENOUS
  Filled 2021-06-19: qty 2

## 2021-06-19 MED ORDER — SODIUM CHLORIDE 0.9% FLUSH: 9.0000 mL | INTRAVENOUS | Status: DC | PRN

## 2021-06-19 MED ORDER — HYDROMORPHONE HCL 2 MG/ML IJ SOLN
2.0000 mg | INTRAMUSCULAR | Status: AC | PRN
  Administered 2021-06-19 (×2): 2 mg via INTRAVENOUS
  Filled 2021-06-19 (×2): qty 1

## 2021-06-19 MED ORDER — DIPHENHYDRAMINE HCL 25 MG PO CAPS: 25.0000 mg | ORAL_CAPSULE | ORAL | Status: DC | PRN

## 2021-06-19 MED ORDER — SENNOSIDES-DOCUSATE SODIUM 8.6-50 MG PO TABS
1.0000 | ORAL_TABLET | Freq: Two times a day (BID) | ORAL | Status: DC
  Administered 2021-06-20 – 2021-06-21 (×4): 1 via ORAL
  Filled 2021-06-19 (×4): qty 1

## 2021-06-19 MED ORDER — DIPHENHYDRAMINE HCL 50 MG/ML IJ SOLN
25.0000 mg | INTRAMUSCULAR | Status: DC | PRN
  Administered 2021-06-19: 25 mg via INTRAVENOUS
  Filled 2021-06-19: qty 1

## 2021-06-19 NOTE — ED Triage Notes (Signed)
C/o shoulder pain and leg pain for 4 days, consistent with sickle cell pain. Last oxycodone approx 4 hours ago.

## 2021-06-19 NOTE — H&P (Signed)
History and Physical   Lindsey [name] [id number] DOB: 27-Jul-1970 DOA: 06/19/2021  PCP: Dorena Dew, FNP   Patient coming from: Home  Chief Complaint: Pain  HPI: Lindsey [name] is a 51 y.o. female with medical history significant of sickle cell disease, anemia, depression presenting with sickle cell pain.  Patient states that she has been dealing with increased sickle cell pain for the past 4 days.  Is located in her bilateral shoulders and legs which is similar to previous pain crises.  She denies fever, shortness of breath, cough.  She tried taking her home as needed doses of oxycodone with no relief.  She denies chills, chest pain, abdominal pain, constipation, diarrhea, nausea, vomiting.  ED Course: Vital signs in the ED stable.  Lab work-up showed CMP with calcium of 8.8.  CBC showed hemoglobin stable at 10.2 and platelets stable at 476.  Respiratory panel for flu and COVID pending.  Reticulocyte mildly elevated at 4.1.  Patient received multiple doses of Dilaudid and a dose of Benadryl in the ED without significant relief.  Review of Systems: As per HPI otherwise all other systems reviewed and are negative.  Past Medical History:  Diagnosis Date   Acute kidney injury ([place]) 01/18/2021   Acute pain of left shoulder    Cough 06/27/2020   Hypokalemia 06/27/2020   Hypoxia    Leukocytosis 09/03/2016   Opiate abuse, episodic ([place]) 09/25/2017   Opioid dependence in remission ([place])    Right leg pain    Sickle cell crisis [facility])     Past Surgical History:  Procedure Laterality Date   APPENDECTOMY     CESAREAN SECTION     OTHER SURGICAL HISTORY     c-section    Social History  reports that she has been smoking cigars and cigarettes. She has never used smokeless tobacco. She reports current alcohol use. She reports that she does not use drugs.  Allergies  Allergen Reactions   Ketamine Anxiety    Anxiety, elevated HR    Family History  Problem Relation Age  of Onset   Stroke Neg Hx        none that she knows of    Seizures Neg Hx   Reviewed on admission  Prior to Admission medications   Medication Sig Start Date End Date Taking? Authorizing Provider  folic acid (FOLVITE) 1 MG tablet Take 1 tablet (1 mg total) by mouth daily. 03/30/21 03/30/22  Dorena Dew, FNP  gabapentin (NEURONTIN) 400 MG capsule TAKE 1 CAPSULE(400 MG) BY MOUTH THREE TIMES DAILY 05/20/21   Dorena Dew, FNP  ibuprofen (ADVIL) 800 MG tablet Take 1 tablet (800 mg total) by mouth every 8 (eight) hours as needed. Patient taking differently: Take 800 mg by mouth every 8 (eight) hours as needed for moderate pain. 05/20/21   Dorena Dew, FNP  oxyCODONE (OXYCONTIN) 10 mg 12 hr tablet Take 1 tablet (10 mg total) by mouth every 12 (twelve) hours. 06/02/21   Dorena Dew, FNP  Oxycodone HCl 10 MG TABS Take 1 tablet (10 mg total) by mouth every 6 (six) hours as needed for up to 15 days (pain). 06/08/21 06/23/21  Dorena Dew, FNP    Physical Exam: Vitals:   06/19/21 2100 06/19/21 2130 06/19/21 2146 06/19/21 2310  BP: 114/73 112/82 112/82 116/85  Pulse: 82 89 94 78  Resp: 17 16 20 18   Temp:      TempSrc:      SpO2: 97% 97%  95% 99%  Weight:      Height:        Physical Exam Constitutional:      General: She is not in acute distress.    Appearance: Normal appearance.  HENT:     Head: Normocephalic and atraumatic.     Mouth/Throat:     Mouth: Mucous membranes are moist.     Pharynx: Oropharynx is clear.  Eyes:     Extraocular Movements: Extraocular movements intact.     Pupils: Pupils are equal, round, and reactive to light.  Cardiovascular:     Rate and Rhythm: Normal rate and regular rhythm.     Pulses: Normal pulses.     Heart sounds: Normal heart sounds.  Pulmonary:     Effort: Pulmonary effort is normal. No respiratory distress.     Breath sounds: Normal breath sounds.  Abdominal:     General: Bowel sounds are normal. There is no distension.      Palpations: Abdomen is soft.     Tenderness: There is no abdominal tenderness.  Musculoskeletal:        General: Tenderness present. No swelling or deformity.  Skin:    General: Skin is warm and dry.  Neurological:     General: No focal deficit present.     Mental Status: Mental status is at baseline.   Labs on Admission: I have personally reviewed following labs and imaging studies  CBC: Recent Labs  Lab 06/19/21 2113  WBC 9.7  NEUTROABS 4.1  HGB 10.2*  HCT 27.2*  MCV 85.5  PLT 476*    Basic Metabolic Panel: Recent Labs  Lab 06/19/21 2113  NA 139  K 4.3  CL 110  CO2 23  GLUCOSE 104*  BUN 12  CREATININE 0.74  CALCIUM 8.8*    GFR: Estimated Creatinine Clearance: 70.8 mL/min (by C-G formula based on SCr of 0.74 mg/dL).  Liver Function Tests: No results for input(s): AST, ALT, ALKPHOS, BILITOT, PROT, ALBUMIN in the last 168 hours.  Urine analysis:    Component Value Date/Time   COLORURINE YELLOW 05/31/2021 1350   APPEARANCEUR TURBID (A) 05/31/2021 1350   LABSPEC 1.012 05/31/2021 1350   PHURINE 5.0 05/31/2021 1350   GLUCOSEU NEGATIVE 05/31/2021 1350   HGBUR SMALL (A) 05/31/2021 1350   BILIRUBINUR NEGATIVE 05/31/2021 1350   BILIRUBINUR neg 04/07/2020 1014   KETONESUR NEGATIVE 05/31/2021 1350   PROTEINUR 100 (A) 05/31/2021 1350   UROBILINOGEN 0.2 04/07/2020 1014   UROBILINOGEN 1.0 12/25/2012 1755   NITRITE NEGATIVE 05/31/2021 1350   LEUKOCYTESUR MODERATE (A) 05/31/2021 1350    Radiological Exams on Admission: No results found.  EKG: Not yet performed  Assessment/Plan Principal Problem:   Sickle cell crisis (HCC) Active Problems:   Sickle cell anemia (HCC)   Sickle cell pain crisis > Patient presenting with increased pain in the setting of known sickle cell and typical distribution for her pain crisis, located in her shoulders and legs. > Hemoglobin stable at 10.2.  Reticulocyte count mildly elevated at 4.1.  No shortness of breath, no fever,  no cough. > Persistent pain despite multiple doses of Dilaudid in the ED. - Monitor on telemetry - Start opioid tolerant to go PCA protocol with Dilaudid - Continue home oxycodone twice daily - Continue home folate - Scheduled Toradol - IV fluids  Anemia > Sickle cell as above.  Hemoglobin stable at 10.2. - Trend CBC  DVT prophylaxis: Lovenox Code Status:   Full Family Communication:  None on admission Disposition Plan:  Patient is from:  Home  Anticipated DC to:  Home  Anticipated DC date:  1 to 3 days  Anticipated DC barriers: None  Consults called:  None Admission status:  Observation, telemetry  Severity of Illness: The appropriate patient status for this patient is OBSERVATION. Observation status is judged to be reasonable and necessary in order to provide the required intensity of service to ensure the patient's safety. The patient's presenting symptoms, physical exam findings, and initial radiographic and laboratory data in the context of their medical condition is felt to place them at decreased risk for further clinical deterioration. Furthermore, it is anticipated that the patient will be medically stable for discharge from the hospital within 2 midnights of admission.   Marcelyn Bruins MD [facility]  How to contact the [facility] Attending or Consulting provider [place] or covering provider during after hours [place], for this patient?   Check the care team in [facility] and look for a) attending/consulting [facility] provider listed and b) the [facility] team listed Log into [email or web address] and use [place]'s universal password to access. If you do not have the password, please contact the hospital operator. Locate the [facility] provider you are looking for under [facility] and page to a number that you can be directly reached. If you still have difficulty reaching the provider, please page the [facility] (Director on Call) for the Hospitalists listed on amion for assistance.  06/19/2021,  11:44 PM

## 2021-06-19 NOTE — ED Provider Notes (Signed)
[facility]-EM[facility]GENCY DEPT Provider Note   CSN: [id number] Arrival date & time: 06/19/21  1929     History  Chief Complaint  Patient presents with   Sickle Cell Pain Crisis    Lindsey [name] is a 51 y.o. female.  Presents with exacerbation of her sickle cell pain crises.  She states has been going on for about 4 days complaining of bilateral shoulder pain and leg pain.  She states these are similar areas where she typically gets her pain crises.  Denies chest pain or shortness of breath or fevers.  Denies cough.  Took oxycodone at home and it was not working so she presents to the ER.        Home Medications Prior to Admission medications   Medication Sig Start Date End Date Taking? Authorizing Provider  folic acid (FOLVITE) 1 MG tablet Take 1 tablet (1 mg total) by mouth daily. 03/30/21 03/30/22  Massie Maroon, FNP  gabapentin (NEURONTIN) 400 MG capsule TAKE 1 CAPSULE(400 MG) BY MOUTH THREE TIMES DAILY 05/20/21   Massie Maroon, FNP  ibuprofen (ADVIL) 800 MG tablet Take 1 tablet (800 mg total) by mouth every 8 (eight) hours as needed. Patient taking differently: Take 800 mg by mouth every 8 (eight) hours as needed for moderate pain. 05/20/21   Massie Maroon, FNP  oxyCODONE (OXYCONTIN) 10 mg 12 hr tablet Take 1 tablet (10 mg total) by mouth every 12 (twelve) hours. 06/02/21   Massie Maroon, FNP  Oxycodone HCl 10 MG TABS Take 1 tablet (10 mg total) by mouth every 6 (six) hours as needed for up to 15 days (pain). 06/08/21 06/23/21  Massie Maroon, FNP      Allergies    Ketamine    Review of Systems   Review of Systems  Constitutional:  Negative for fever.  HENT:  Negative for ear pain.   Eyes:  Negative for pain.  Respiratory:  Negative for cough.   Cardiovascular:  Negative for chest pain.  Gastrointestinal:  Negative for abdominal pain.  Genitourinary:  Negative for flank pain.  Musculoskeletal:  Negative for back pain.  Skin:   Negative for rash.  Neurological:  Negative for headaches.   Physical Exam Updated Vital Signs BP 116/85 (BP Location: Right Arm)    Pulse 78    Temp 98 F (36.7 C) (Oral)    Resp 18    Ht 5' (1.524 m)    Wt 63.5 kg    SpO2 99%    BMI 27.34 kg/m  Physical Exam Constitutional:      Appearance: Normal appearance.     Comments: Crying, appears in pain  HENT:     Head: Normocephalic.     Nose: Nose normal.  Eyes:     Extraocular Movements: Extraocular movements intact.  Cardiovascular:     Rate and Rhythm: Normal rate.  Pulmonary:     Effort: Pulmonary effort is normal.  Abdominal:     Tenderness: There is no abdominal tenderness. There is no guarding or rebound.  Musculoskeletal:        General: Normal range of motion.     Cervical back: Normal range of motion.  Neurological:     General: No focal deficit present.     Mental Status: She is alert. Mental status is at baseline.    ED Results / Procedures / Treatments   Labs (all labs ordered are listed, but only abnormal results are displayed) Labs Reviewed  CBC  WITH DIFFERENTIAL/PLATELET - Abnormal; Notable for the following components:      Result Value   RBC 3.18 (*)    Hemoglobin 10.2 (*)    HCT 27.2 (*)    MCHC 37.5 (*)    Platelets 476 (*)    nRBC 0.5 (*)    Lymphs Abs 4.3 (*)    Eosinophils Absolute 0.6 (*)    All other components within normal limits  BASIC METABOLIC PANEL - Abnormal; Notable for the following components:   Glucose, Bld 104 (*)    Calcium 8.8 (*)    All other components within normal limits  RETICULOCYTES - Abnormal; Notable for the following components:   Retic Ct Pct 4.1 (*)    RBC. 3.14 (*)    Immature Retic Fract 43.1 (*)    All other components within normal limits  RESP PANEL BY RT-PCR (FLU A&B, COVID) [id number]    EKG None  Radiology No results found.  Procedures Procedures    Medications Ordered in ED Medications  diphenhydrAMINE (BENADRYL) injection 25 mg (25 mg  Intravenous Given 06/19/21 2145)  HYDROmorphone (DILAUDID) injection 3 mg (3 mg Intramuscular Given 06/19/21 2037)  HYDROmorphone (DILAUDID) injection 2 mg (2 mg Intravenous Given 06/19/21 2308)    ED Course/ Medical Decision Making/ A&P                           Medical Decision Making Amount and/or Complexity of Data Reviewed Labs: ordered.  Risk Prescription drug management.    Patient last seen in the office on chart review May 20, 2021 for vitamin D deficiency.  Patient initially mildly tachycardic on arrival otherwise vital signs stable. She is a difficult stick nurse called IV therapy to come and try to place an IV.  Patient ordered for Dilaudid and labs for sickle cell pain crisis work-up.  Patient given several doses of IV Dilaudid 2 mg.  Pain appears somewhat improved but still trying still in significant pain.  Will be admitted to the hospitalist team for sickle cell pain crises.        Final Clinical Impression(s) / ED Diagnoses Final diagnoses:  Sickle cell pain crisis [facility])    Rx / DC Orders ED Discharge Orders     None         Cheryll Cockayne, MD 06/19/21 2317

## 2021-06-20 ENCOUNTER — Encounter (HOSPITAL_COMMUNITY): Payer: Self-pay | Admitting: Internal Medicine

## 2021-06-20 DIAGNOSIS — D5700 Hb-SS disease with crisis, unspecified: Secondary | ICD-10-CM

## 2021-06-20 LAB — RESP PANEL BY RT-PCR (FLU A&B, COVID) ARPGX2
Influenza A by PCR: NEGATIVE
Influenza B by PCR: NEGATIVE
SARS Coronavirus 2 by RT PCR: NEGATIVE

## 2021-06-20 MED ORDER — SODIUM CHLORIDE 0.9 % IV SOLN: INTRAVENOUS | Status: AC

## 2021-06-20 NOTE — Progress Notes (Signed)
Subjective: Lindsey [name] is a 51 year old female with a medical history significant for sickle cell disease, chronic pain syndrome, opiate dependence and tolerance, and anemia of chronic disease that was admitted and sickle cell pain crisis.  Patient pain intensity has improved somewhat overnight.  She rates her pain as 7/10 primarily to left shoulder.  She denies any headache, chest pain, urinary symptoms, nausea, vomiting, or diarrhea. Objective:  Vital signs in last 24 hours:  Vitals:   06/20/21 0405 06/20/21 0417 06/20/21 0719 06/20/21 0930  BP:  (!) 90/59  96/71  Pulse:  67  61  Resp: 18 18 18 15   Temp:  98.2 F (36.8 C)  97.9 F (36.6 C)  TempSrc:  Oral  Oral  SpO2: 98% 92% 92% 97%  Weight:      Height:        Intake/Output from previous day:   Intake/Output Summary (Last 24 hours) at 06/20/2021 1224 Last data filed at 06/20/2021 1000 Gross per 24 hour  Intake 470.26 ml  Output --  Net 470.26 ml    Physical Exam: General: Alert, awake, oriented x3, in no acute distress.  HEENT: [place]/AT PEERL, EOMI Neck: Trachea midline,  no masses, no thyromegal,y no JVD, no carotid bruit OROPHARYNX:  Moist, No exudate/ erythema/lesions.  Heart: Regular rate and rhythm, without murmurs, rubs, gallops, PMI non-displaced, no heaves or thrills on palpation.  Lungs: Clear to auscultation, no wheezing or rhonchi noted. No increased vocal fremitus resonant to percussion  Abdomen: Soft, nontender, nondistended, positive bowel sounds, no masses no hepatosplenomegaly noted..  Neuro: No focal neurological deficits noted cranial nerves II through XII grossly intact. DTRs 2+ bilaterally upper and lower extremities. Strength 5 out of 5 in bilateral upper and lower extremities. Musculoskeletal: No warm swelling or erythema around joints, no spinal tenderness noted. Psychiatric: Patient alert and oriented x3, good insight and cognition, good recent to remote recall. Lymph node survey: No cervical  axillary or inguinal lymphadenopathy noted.  Lab Results:  Basic Metabolic Panel:    Component Value Date/Time   NA 139 06/19/2021 2113   NA 142 05/20/2021 1101   K 4.3 06/19/2021 2113   CL 110 06/19/2021 2113   CO2 23 06/19/2021 2113   BUN 12 06/19/2021 2113   BUN 10 05/20/2021 1101   CREATININE 0.74 06/19/2021 2113   GLUCOSE 104 (H) 06/19/2021 2113   CALCIUM 8.8 (L) 06/19/2021 2113   CBC:    Component Value Date/Time   WBC 9.7 06/19/2021 2113   HGB 10.2 (L) 06/19/2021 2113   HGB 11.0 (L) 05/20/2021 1101   HCT 27.2 (L) 06/19/2021 2113   HCT 31.3 (L) 05/20/2021 1101   PLT 476 (H) 06/19/2021 2113   PLT 509 (H) 05/20/2021 1101   MCV 85.5 06/19/2021 2113   MCV 88 05/20/2021 1101   NEUTROABS 4.1 06/19/2021 2113   NEUTROABS 5.4 05/20/2021 1101   LYMPHSABS 4.3 (H) 06/19/2021 2113   LYMPHSABS 2.5 05/20/2021 1101   MONOABS 0.6 06/19/2021 2113   EOSABS 0.6 (H) 06/19/2021 2113   EOSABS 0.3 05/20/2021 1101   BASOSABS 0.1 06/19/2021 2113   BASOSABS 0.1 05/20/2021 1101    Recent Results (from the past 240 hour(s))  Resp Panel by RT-PCR (Flu A&B, Covid) Nasopharyngeal Swab     Status: None   Collection Time: 06/19/21 11:25 PM   Specimen: Nasopharyngeal Swab; Nasopharyngeal(NP) swabs in vial transport medium  Result Value Ref Range Status   SARS Coronavirus 2 by RT PCR NEGATIVE NEGATIVE Final  Comment: (NOTE) SARS-CoV-2 target nucleic acids are NOT DETECTED.  The SARS-CoV-2 RNA is generally detectable in upper respiratory specimens during the acute phase of infection. The lowest concentration of SARS-CoV-2 viral copies this assay can detect is 138 copies/mL. A negative result does not preclude SARS-Cov-2 infection and should not be used as the sole basis for treatment or other patient management decisions. A negative result may occur with  improper specimen collection/handling, submission of specimen other than nasopharyngeal swab, presence of viral mutation(s) within  the areas targeted by this assay, and inadequate number of viral copies(<138 copies/mL). A negative result must be combined with clinical observations, patient history, and epidemiological information. The expected result is Negative.  Fact Sheet for Patients:  BloggerCourse.com  Fact Sheet for Healthcare Providers:  SeriousBroker.it  This test is no t yet approved or cleared by the [place] FDA and  has been authorized for detection and/or diagnosis of SARS-CoV-2 by FDA under an Emergency Use Authorization (EUA). This EUA will remain  in effect (meaning this test can be used) for the duration of the COVID-19 declaration under Section 564(b)(1) of the Act, 21 U.S.C.section 360bbb-3(b)(1), unless the authorization is terminated  or revoked sooner.       Influenza A by PCR NEGATIVE NEGATIVE Final   Influenza B by PCR NEGATIVE NEGATIVE Final    Comment: (NOTE) The Xpert Xpress SARS-CoV-2/FLU/RSV plus assay is intended as an aid in the diagnosis of influenza from Nasopharyngeal swab specimens and should not be used as a sole basis for treatment. Nasal washings and aspirates are unacceptable for Xpert Xpress SARS-CoV-2/FLU/RSV testing.  Fact Sheet for Patients: BloggerCourse.com  Fact Sheet for Healthcare Providers: SeriousBroker.it  This test is not yet approved or cleared by the [place] FDA and has been authorized for detection and/or diagnosis of SARS-CoV-2 by FDA under an Emergency Use Authorization (EUA). This EUA will remain in effect (meaning this test can be used) for the duration of the COVID-19 declaration under Section 564(b)(1) of the Act, 21 U.S.C. section 360bbb-3(b)(1), unless the authorization is terminated or revoked.  Performed at [facility], [address], [place], [place] [address]     Studies/Results: No results  found.  Medications: Scheduled Meds:  enoxaparin (LOVENOX) injection  40 mg Subcutaneous Q24H   folic acid  1 mg Oral Daily   gabapentin  400 mg Oral TID   HYDROmorphone   Intravenous Q4H   ketorolac  15 mg Intravenous Q6H   oxyCODONE  10 mg Oral Q12H   senna-docusate  1 tablet Oral BID   Continuous Infusions: PRN Meds:.diphenhydrAMINE, HYDROmorphone (DILAUDID) injection, naloxone **AND** sodium chloride flush, ondansetron **OR** ondansetron (ZOFRAN) IV, polyethylene glycol  Consultants: none  Procedures: none  Antibiotics: None  Assessment/Plan: Principal Problem:   Sickle cell crisis (HCC) Active Problems:   Chronic pain   Sickle cell anemia (HCC)  Sickle cell disease with pain crisis: Continue IV Dilaudid PCA decrease IV fluids to KVO Toradol 15 mg IV every 6 hours OxyContin 10 mg every 12 hours Monitor vital signs very closely, reevaluate pain scale regularly, and supplemental oxygen as needed  Chronic pain syndrome: Continue home medications  Anemia of chronic disease: Hemoglobin is stable and consistent with patient's baseline.  There is no clinical indication for blood transfusion at this time.  Continue to follow closely.  Code Status: Full Code Family Communication: N/A Disposition Plan: Not yet ready for discharge  Halim Surrette Rennis Petty  APRN, MSN, FNP-C [facility] [address] [place], [place] [address] [phone]  If 7PM-7AM, please contact night-coverage.  06/20/2021, 12:24 PM  LOS: 0 days

## 2021-06-21 MED ORDER — OXYCODONE HCL 10 MG PO TABS: 10.0000 mg | ORAL_TABLET | Freq: Four times a day (QID) | ORAL | 0 refills | Status: DC | PRN

## 2021-06-21 NOTE — Discharge Summary (Signed)
Physician Discharge Summary  Lindsey [name] [id number] DOB: 1971-03-18 DOA: 06/19/2021  PCP: Dorena Dew, FNP  Admit date: 06/19/2021  Discharge date: 06/21/2021  Discharge Diagnoses:  Principal Problem:   Sickle cell crisis ([place]) Active Problems:   Chronic pain   Sickle cell anemia ([place])   Discharge Condition: Stable  Disposition:   Follow-up Information     Dorena Dew, FNP Follow up in 1 week(s).   Specialty: Family Medicine Contact information: [place]. [place] [address] [phone]                Pt is discharged home in good condition and is to follow up with Dorena Dew, FNP this week to have labs evaluated. Lindsey [name] is instructed to increase activity slowly and balance with rest for the next few days, and use prescribed medication to complete treatment of pain  Diet: Regular Wt Readings from Last 3 Encounters:  06/19/21 63.5 kg  05/31/21 61.7 kg  05/20/21 61.8 kg    History of present illness:  Lindsey [name] is a 51 year old female with a medical history significant for sickle cell disease, anemia of chronic disease, chronic pain syndrome, opiate dependence and tolerance, and history of depression presented to the emergency department with complaints of sickle cell pain. Patient states that she has been dealing with increased sickle cell pain over the past 4 days.  Pain is located in her bilateral shoulders and legs, which is similar to previous pain crises.  She denies fever, shortness of breath, cough.  She tried taking her home medication as needed dose of oxycodone with no relief. She denies chills, chest pain, abdominal pain, constipation, diarrhea, nausea, or vomiting.  ER course: Vital signs in the ER stable.  Lab work-up shows CMP with calcium of 8.8.  CBC show hemoglobin stable at 10.2 and platelet stable at 476.  Respiratory panel for flu and COVID panel negative.  Reticulocytes mildly  elevated at 4.1.  Patient received multiple doses of Dilaudid and a dose of Benadryl in the ED without significant relief.  Patient thereby admitted for sickle cell pain crisis.  Hospital Course:  Sickle cell disease with pain crisis: Patient was admitted for sickle cell pain crisis and managed appropriately with IVF, IV Dilaudid via PCA and IV Toradol, as well as other adjunct therapies per sickle cell pain management protocols.  IV Dilaudid PCA weaned appropriately.  Patient transition to home medications.  Her pain intensity is 3/10.  Patient is requesting home oxycodone.  PDMP substance reporting system reviewed prior to prescribing opiate medications, no inconsistencies noted.  Oxycodone 10 mg #60 was sent to patient's pharmacy, patient able to fill on 06/22/2021.  Patient is alert, oriented, and ambulating without assistance.  She will discharge home in a hemodynamically stable condition.  Patient is aware of all upcoming appointments.   Lindsey [name] will follow-up with PCP within 1 week of this discharge. Lindsey [name] was counseled extensively about nonpharmacologic means of pain management, patient verbalized understanding and was appreciative of  the care received during this admission.   We discussed the need for good hydration, monitoring of hydration status, avoidance of heat, cold, stress, and infection triggers.  Patient was reminded of the need to seek medical attention immediately if any symptom of bleeding, anemia, or infection occurs.  Discharge Exam: Vitals:   06/21/21 1203 06/21/21 1317  BP:  (!) 101/57  Pulse:  72  Resp: 16 14  Temp:  98.7 F (37.1 C)  SpO2: 97% 93%   Vitals:   06/21/21 0822 06/21/21 1017 06/21/21 1203 06/21/21 1317  BP:  102/64  (!) 101/57  Pulse:  65  72  Resp: 14 16 16 14   Temp:  98.7 F (37.1 C)  98.7 F (37.1 C)  TempSrc:  Oral  Oral  SpO2: 94% 97% 97% 93%  Weight:      Height:        General appearance : Awake, alert, not in any distress.  Speech Clear. Not toxic looking HEENT: Atraumatic and Normocephalic, pupils equally reactive to light and accomodation Neck: Supple, no JVD. No cervical lymphadenopathy.  Chest: Good air entry bilaterally, no added sounds  CVS: S1 S2 regular, no murmurs.  Abdomen: Bowel sounds present, Non tender and not distended with no guarding, rigidity or rebound. Extremities: B/L Lower Ext shows no edema, both legs are warm to touch Neurology: Awake alert, and oriented X 3, CN II-XII intact, Non focal Skin: No Rash  Discharge Instructions  Discharge Instructions     Discharge patient   Complete by: As directed    Discharge disposition: 01-Home or Self Care   Discharge patient date: 06/21/2021      Allergies as of 06/21/2021       Reactions   Ketamine Anxiety   Anxiety, elevated HR        Medication List     TAKE these medications    folic acid 1 MG tablet Commonly known as: FOLVITE Take 1 tablet (1 mg total) by mouth daily.   gabapentin 400 MG capsule Commonly known as: NEURONTIN TAKE 1 CAPSULE(400 MG) BY MOUTH THREE TIMES DAILY   ibuprofen 800 MG tablet Commonly known as: ADVIL Take 1 tablet (800 mg total) by mouth every 8 (eight) hours as needed. What changed: reasons to take this   oxyCODONE 10 mg 12 hr tablet Commonly known as: OXYCONTIN Take 1 tablet (10 mg total) by mouth every 12 (twelve) hours.   Oxycodone HCl 10 MG Tabs Take 1 tablet (10 mg total) by mouth every 6 (six) hours as needed for up to 15 days (pain).        The results of significant diagnostics from this hospitalization (including imaging, microbiology, ancillary and laboratory) are listed below for reference.    Significant Diagnostic Studies: No results found.  Microbiology: Recent Results (from the past 240 hour(s))  Resp Panel by RT-PCR (Flu A&B, Covid) Nasopharyngeal Swab     Status: None   Collection Time: 06/19/21 11:25 PM   Specimen: Nasopharyngeal Swab; Nasopharyngeal(NP) swabs in  vial transport medium  Result Value Ref Range Status   SARS Coronavirus 2 by RT PCR NEGATIVE NEGATIVE Final    Comment: (NOTE) SARS-CoV-2 target nucleic acids are NOT DETECTED.  The SARS-CoV-2 RNA is generally detectable in upper respiratory specimens during the acute phase of infection. The lowest concentration of SARS-CoV-2 viral copies this assay can detect is 138 copies/mL. A negative result does not preclude SARS-Cov-2 infection and should not be used as the sole basis for treatment or other patient management decisions. A negative result may occur with  improper specimen collection/handling, submission of specimen other than nasopharyngeal swab, presence of viral mutation(s) within the areas targeted by this assay, and inadequate number of viral copies(<138 copies/mL). A negative result must be combined with clinical observations, patient history, and epidemiological information. The expected result is Negative.  Fact Sheet for Patients:  EntrepreneurPulse.com.au  Fact Sheet for Healthcare Providers:  IncredibleEmployment.be  This test is no t  yet approved or cleared by the [place] and  has been authorized for detection and/or diagnosis of SARS-CoV-2 by FDA under an Emergency Use Authorization (EUA). This EUA will remain  in effect (meaning this test can be used) for the duration of the COVID-19 declaration under Section 564(b)(1) of the Act, 21 U.S.C.section 360bbb-3(b)(1), unless the authorization is terminated  or revoked sooner.       Influenza A by PCR NEGATIVE NEGATIVE Final   Influenza B by PCR NEGATIVE NEGATIVE Final    Comment: (NOTE) The Xpert Xpress SARS-CoV-2/FLU/RSV plus assay is intended as an aid in the diagnosis of influenza from Nasopharyngeal swab specimens and should not be used as a sole basis for treatment. Nasal washings and aspirates are unacceptable for Xpert Xpress SARS-CoV-2/FLU/RSV testing.  Fact  Sheet for Patients: EntrepreneurPulse.com.au  Fact Sheet for Healthcare Providers: IncredibleEmployment.be  This test is not yet approved or cleared by the [place] FDA and has been authorized for detection and/or diagnosis of SARS-CoV-2 by FDA under an Emergency Use Authorization (EUA). This EUA will remain in effect (meaning this test can be used) for the duration of the COVID-19 declaration under Section 564(b)(1) of the Act, 21 U.S.C. section 360bbb-3(b)(1), unless the authorization is terminated or revoked.  Performed at [facility], [place] [address], [address], [place] [address]      Labs: Basic Metabolic Panel: Recent Labs  Lab 06/19/21 2113  NA 139  K 4.3  CL 110  CO2 23  GLUCOSE 104*  BUN 12  CREATININE 0.74  CALCIUM 8.8*   Liver Function Tests: No results for input(s): AST, ALT, ALKPHOS, BILITOT, PROT, ALBUMIN in the last 168 hours. No results for input(s): LIPASE, AMYLASE in the last 168 hours. No results for input(s): AMMONIA in the last 168 hours. CBC: Recent Labs  Lab 06/19/21 2113  WBC 9.7  NEUTROABS 4.1  HGB 10.2*  HCT 27.2*  MCV 85.5  PLT 476*   Cardiac Enzymes: No results for input(s): CKTOTAL, CKMB, CKMBINDEX, TROPONINI in the last 168 hours. BNP: Invalid input(s): POCBNP CBG: No results for input(s): GLUCAP in the last 168 hours.  Time coordinating discharge: 30 minutes  Signed:  [name]  APRN, MSN, FNP-C [facility] [address] [place], [place] [address] [phone]  [facility] 06/21/2021, 5:26 PM

## 2021-06-28 ENCOUNTER — Other Ambulatory Visit: Payer: Self-pay | Admitting: Family Medicine

## 2021-06-28 DIAGNOSIS — G894 Chronic pain syndrome: Secondary | ICD-10-CM

## 2021-06-28 MED ORDER — OXYCODONE HCL ER 10 MG PO T12A: 10.0000 mg | EXTENDED_RELEASE_TABLET | Freq: Two times a day (BID) | ORAL | 0 refills | Status: DC

## 2021-06-28 NOTE — Progress Notes (Signed)
Reviewed PDMP substance reporting system prior to prescribing opiate medications. No inconsistencies noted.  Meds ordered this encounter  Medications   oxyCODONE (OXYCONTIN) 10 mg 12 hr tablet    Sig: Take 1 tablet (10 mg total) by mouth every 12 (twelve) hours.    Dispense:  60 tablet    Refill:  0    Order Specific Question:   Supervising Provider    Answer:   [name], [name]      Brittanyann Wittner Moore Melady Chow  APRN, MSN, FNP-C [facility] [address]  [place], [place] [address] [phone]  

## 2021-07-05 ENCOUNTER — Other Ambulatory Visit: Payer: Self-pay | Admitting: Family Medicine

## 2021-07-05 DIAGNOSIS — G894 Chronic pain syndrome: Secondary | ICD-10-CM

## 2021-07-05 MED ORDER — OXYCODONE HCL 10 MG PO TABS: 10.0000 mg | ORAL_TABLET | Freq: Four times a day (QID) | ORAL | 0 refills | Status: DC | PRN

## 2021-07-05 NOTE — Progress Notes (Signed)
Reviewed PDMP substance reporting system prior to prescribing opiate medications. No inconsistencies noted.  Meds ordered this encounter  Medications   Oxycodone HCl 10 MG TABS    Sig: Take 1 tablet (10 mg total) by mouth every 6 (six) hours as needed for up to 15 days (pain).    Dispense:  60 tablet    Refill:  0    Order Specific Question:   Supervising Provider    Answer:   [name], [name]   Anaija Wissink Moore Rhonna Holster  APRN, MSN, FNP-C [facility] [address]  , [place] [address] [phone]  

## 2021-07-16 ENCOUNTER — Other Ambulatory Visit: Payer: Self-pay

## 2021-07-16 ENCOUNTER — Encounter (HOSPITAL_COMMUNITY): Payer: Self-pay | Admitting: Emergency Medicine

## 2021-07-16 ENCOUNTER — Inpatient Hospital Stay (HOSPITAL_COMMUNITY)
Admission: EM | Admit: 2021-07-16 | Discharge: 2021-07-19 | DRG: 812 | Disposition: A | Discharge status: Home / Self Care | Payer: Medicaid Other | Attending: Internal Medicine | Admitting: Internal Medicine

## 2021-07-16 DIAGNOSIS — F17210 Nicotine dependence, cigarettes, uncomplicated: Secondary | ICD-10-CM | POA: Diagnosis present

## 2021-07-16 DIAGNOSIS — D5700 Hb-SS disease with crisis, unspecified: Principal | ICD-10-CM | POA: Diagnosis present

## 2021-07-16 DIAGNOSIS — F17290 Nicotine dependence, other tobacco product, uncomplicated: Secondary | ICD-10-CM | POA: Diagnosis present

## 2021-07-16 DIAGNOSIS — F1190 Opioid use, unspecified, uncomplicated: Secondary | ICD-10-CM | POA: Diagnosis present

## 2021-07-16 DIAGNOSIS — G894 Chronic pain syndrome: Secondary | ICD-10-CM

## 2021-07-16 DIAGNOSIS — Z888 Allergy status to other drugs, medicaments and biological substances status: Secondary | ICD-10-CM

## 2021-07-16 DIAGNOSIS — Z79899 Other long term (current) drug therapy: Secondary | ICD-10-CM

## 2021-07-16 DIAGNOSIS — Z79891 Long term (current) use of opiate analgesic: Secondary | ICD-10-CM

## 2021-07-16 DIAGNOSIS — Z20822 Contact with and (suspected) exposure to covid-19: Secondary | ICD-10-CM | POA: Diagnosis present

## 2021-07-16 LAB — COMPREHENSIVE METABOLIC PANEL
ALT: 14 U/L (ref 0–44)
AST: 18 U/L (ref 15–41)
Albumin: 4.5 g/dL (ref 3.5–5.0)
Alkaline Phosphatase: 54 U/L (ref 38–126)
Anion gap: 7 (ref 5–15)
BUN: 10 mg/dL (ref 6–20)
CO2: 26 mmol/L (ref 22–32)
Calcium: 9.1 mg/dL (ref 8.9–10.3)
Chloride: 107 mmol/L (ref 98–111)
Creatinine, Ser: 0.86 mg/dL (ref 0.44–1.00)
GFR, Estimated: >60 mL/min (ref >60)
Glucose, Bld: 121 mg/dL — ABNORMAL HIGH (ref 70–99)
Potassium: 4 mmol/L (ref 3.5–5.1)
Sodium: 140 mmol/L (ref 135–145)
Total Bilirubin: 1.2 mg/dL (ref 0.3–1.2)
Total Protein: 7.8 g/dL (ref 6.5–8.1)

## 2021-07-16 LAB — CBC WITH DIFFERENTIAL/PLATELET
Abs Immature Granulocytes: 0.02 10*3/uL (ref 0.00–0.07)
Basophils Absolute: 0.1 10*3/uL (ref 0.0–0.1)
Basophils Relative: 1 %
Eosinophils Absolute: 0.4 10*3/uL (ref 0.0–0.5)
Eosinophils Relative: 4 %
HCT: 28.4 % — ABNORMAL LOW (ref 36.0–46.0)
Hemoglobin: 10.5 g/dL — ABNORMAL LOW (ref 12.0–15.0)
Immature Granulocytes: 0 %
Lymphocytes Relative: 51 %
Lymphs Abs: 4.9 10*3/uL — ABNORMAL HIGH (ref 0.7–4.0)
MCH: 31.8 pg (ref 26.0–34.0)
MCHC: 37 g/dL — ABNORMAL HIGH (ref 30.0–36.0)
MCV: 86.1 fL (ref 80.0–100.0)
Monocytes Absolute: 0.6 10*3/uL (ref 0.1–1.0)
Monocytes Relative: 6 %
Neutro Abs: 3.6 10*3/uL (ref 1.7–7.7)
Neutrophils Relative %: 38 %
Platelets: 459 10*3/uL — ABNORMAL HIGH (ref 150–400)
RBC: 3.3 MIL/uL — ABNORMAL LOW (ref 3.87–5.11)
RDW: 13.4 % (ref 11.5–15.5)
WBC: 9.6 10*3/uL (ref 4.0–10.5)
nRBC: 0.6 % — ABNORMAL HIGH (ref 0.0–0.2)

## 2021-07-16 LAB — RETICULOCYTES
Immature Retic Fract: 22.7 % — ABNORMAL HIGH (ref 2.3–15.9)
RBC.: 3.26 MIL/uL — ABNORMAL LOW (ref 3.87–5.11)
Retic Count, Absolute: 142.8 10*3/uL (ref 19.0–186.0)
Retic Ct Pct: 4.4 % — ABNORMAL HIGH (ref 0.4–3.1)

## 2021-07-16 LAB — RESP PANEL BY RT-PCR (FLU A&B, COVID) ARPGX2
Influenza A by PCR: NEGATIVE
Influenza B by PCR: NEGATIVE
SARS Coronavirus 2 by RT PCR: NEGATIVE

## 2021-07-16 MED ORDER — ONDANSETRON HCL 4 MG/2ML IJ SOLN
4.0000 mg | INTRAMUSCULAR | Status: DC | PRN
  Administered 2021-07-16: 4 mg via INTRAVENOUS
  Filled 2021-07-16: qty 2

## 2021-07-16 MED ORDER — HYDROMORPHONE HCL 2 MG/ML IJ SOLN
2.0000 mg | INTRAMUSCULAR | Status: AC
  Administered 2021-07-16: 2 mg via INTRAVENOUS
  Filled 2021-07-16: qty 1

## 2021-07-16 MED ORDER — SODIUM CHLORIDE 0.45 % IV SOLN: INTRAVENOUS | Status: DC

## 2021-07-16 MED ORDER — HYDROMORPHONE HCL 2 MG/ML IJ SOLN
2.0000 mg | INTRAMUSCULAR | Status: AC
  Administered 2021-07-17: 2 mg via INTRAVENOUS
  Filled 2021-07-16: qty 1

## 2021-07-16 MED ORDER — DIPHENHYDRAMINE HCL 25 MG PO CAPS
25.0000 mg | ORAL_CAPSULE | ORAL | Status: DC | PRN
  Administered 2021-07-16 – 2021-07-17 (×2): 25 mg via ORAL
  Filled 2021-07-16 (×3): qty 1

## 2021-07-16 NOTE — ED Triage Notes (Signed)
Patient presents [facility] x2 days. Patient complaining of right shoulder pain and right leg pain. Patient extremely tearful in triage.  ?

## 2021-07-16 NOTE — ED Provider Notes (Signed)
?[place] DEPT ?[facility] Emergency Department ?Provider Note ?MRN:  [id number]  ?Arrival date & time: 07/17/21    ? ?Chief Complaint   ?Sickle Cell Pain Crisis ?  ?History of Present Illness   ?Lindsey [name] is a 51 y.o. year-old female presents to the ED with chief complaint of sickle cell pain.  She states that this feels like her typical sickle cell pain crisis.  She has been taking her home oxycodone and ibuprofen without relief.  She complains of pain in her right shoulder and right leg.  She denies chest pain, shortness of breath, cough, fever, or chills.  She states the symptoms started about 2 days ago. ? ?History provided by patient. ? ? ?Review of Systems  ?Pertinent review of systems noted in HPI.  ? ? ?Physical Exam  ? ?Vitals:  ? 07/17/21 0030 07/17/21 0104  ?BP: 106/68 101/62  ?Pulse: 66 68  ?Resp: 14 11  ?Temp:    ?SpO2: 96% 96%  ?  ?CONSTITUTIONAL:  uncomfortable-appearing, NAD ?NEURO:  Alert and oriented x 3 ?EYES:  eyes equal and reactive ?ENT/NECK:  Supple, no stridor  ?CARDIO:  normal rate, regular rhythm, appears well-perfused  ?PULM:  No respiratory distress, CTAB ?GI/GU:  non-distended,  ?MSK/SPINE:  No gross deformities, no edema, moves all extremities, no warm or swollen joints ?SKIN:  no rash, atraumatic ? ? ?*Additional and/or pertinent findings included in MDM below ? ?Diagnostic and Interventional Summary  ? ? ?Labs Reviewed  ?COMPREHENSIVE METABOLIC PANEL - Abnormal; Notable for the following components:  ?    Result Value  ? Glucose, Bld 121 (*)   ? All other components within normal limits  ?CBC WITH DIFFERENTIAL/PLATELET - Abnormal; Notable for the following components:  ? RBC 3.30 (*)   ? Hemoglobin 10.5 (*)   ? HCT 28.4 (*)   ? MCHC 37.0 (*)   ? Platelets 459 (*)   ? nRBC 0.6 (*)   ? Lymphs Abs 4.9 (*)   ? All other components within normal limits  ?RETICULOCYTES - Abnormal; Notable for the following components:  ? Retic Ct Pct 4.4 (*)   ? RBC. 3.26 (*)   ?  Immature Retic Fract 22.7 (*)   ? All other components within normal limits  ?RESP PANEL BY RT-PCR (FLU A&B, COVID) [id number]  ?I-STAT BETA HCG BLOOD, ED (MC, [facility], AP ONLY)  ?  ?No orders to display  ?  ?Medications  ?0.45 % sodium chloride infusion ( Intravenous New Bag/Given 07/16/21 2308)  ?diphenhydrAMINE (BENADRYL) capsule 25-50 mg (25 mg Oral Given 07/16/21 2302)  ?ondansetron [facility]) injection 4 mg (4 mg Intravenous Given 07/16/21 2302)  ?HYDROmorphone (DILAUDID) injection 2 mg (2 mg Intravenous Given 07/16/21 2302)  ?HYDROmorphone (DILAUDID) injection 2 mg (2 mg Intravenous Given 07/16/21 2345)  ?HYDROmorphone (DILAUDID) injection 2 mg (2 mg Intravenous Given 07/17/21 0020)  ?  ? ?Procedures  /  Critical Care ?.Critical Care ?Performed by: Montine Circle, PA-C ?Authorized by: Montine Circle, PA-C  ? ?Critical care provider statement:  ?  Critical care time (minutes):  42 ?  Critical care was necessary to treat or prevent imminent or life-threatening deterioration of the following conditions: sickle cell pain crisis requiring multiple IV pain meds and continuous monitoring. ?  Critical care was time spent personally by me on the following activities:  Development of treatment plan with patient or surrogate, discussions with consultants, evaluation of patient's response to treatment, examination of patient, ordering and review of laboratory studies, ordering and  review of radiographic studies, ordering and performing treatments and interventions, pulse oximetry, re-evaluation of patient's condition and review of old charts ? ?ED Course and Medical Decision Making  ?I have reviewed the triage vital signs, the nursing notes, and pertinent available records from the EMR. ? ?Complexity of Problems Addressed: ?High Complexity: Acute illness/injury posing a threat to life or bodily function, requiring emergent diagnostic workup, evaluation, and treatment as below. ?Comorbidities affecting this illness/injury  include: ?Sickle Cell  ?Social Determinants Affecting Care: ? ? ? ?ED Course: ?After considering the following differential, sickle cell pain crisis, chronic pain syndrome, opiate withdrawal, I ordered labs and pain meds per protocol. ?I personally interpreted the labs which are notable for no leukocytosis, HGB about baseline . ? ?  ? ?Consultants: ?I discussed the case with Hospitalist, Dr. Tonie Griffith, who is appreciated for admitting. ? ?Treatment and Plan: ?Admit for sickle cell with pain that is unable to be controlled in the ED. ? ?Patient's exam and diagnostic results are concerning for pain crisis.  Feel that patient will need admission to the hospital for further treatment and evaluation. ? ? ? ?Final Clinical Impressions(s) / ED Diagnoses  ? ?  ICD-10-CM   ?1. Sickle cell pain crisis ([place])  D57.00   ?  ?  ?ED Discharge Orders   ? ? None  ? ?  ?  ? ? ?Discharge Instructions Discussed with and Provided to Patient:  ? ?Discharge Instructions   ?None ?  ? ?  ?Montine Circle, PA-C ?07/17/21 0122 ? ?  ?Shanon Rosser, MD ?07/17/21 [phone] ? ?

## 2021-07-17 ENCOUNTER — Encounter (HOSPITAL_COMMUNITY): Payer: Self-pay | Admitting: Family Medicine

## 2021-07-17 DIAGNOSIS — D5700 Hb-SS disease with crisis, unspecified: Secondary | ICD-10-CM | POA: Diagnosis present

## 2021-07-17 DIAGNOSIS — Z20822 Contact with and (suspected) exposure to covid-19: Secondary | ICD-10-CM | POA: Diagnosis present

## 2021-07-17 DIAGNOSIS — F17290 Nicotine dependence, other tobacco product, uncomplicated: Secondary | ICD-10-CM | POA: Diagnosis present

## 2021-07-17 DIAGNOSIS — G894 Chronic pain syndrome: Secondary | ICD-10-CM | POA: Diagnosis present

## 2021-07-17 DIAGNOSIS — Z888 Allergy status to other drugs, medicaments and biological substances status: Secondary | ICD-10-CM | POA: Diagnosis not present

## 2021-07-17 DIAGNOSIS — Z79899 Other long term (current) drug therapy: Secondary | ICD-10-CM | POA: Diagnosis not present

## 2021-07-17 DIAGNOSIS — F17210 Nicotine dependence, cigarettes, uncomplicated: Secondary | ICD-10-CM | POA: Diagnosis present

## 2021-07-17 DIAGNOSIS — Z79891 Long term (current) use of opiate analgesic: Secondary | ICD-10-CM | POA: Diagnosis not present

## 2021-07-17 HISTORY — DX: Hb-SS disease with crisis, unspecified: D57.00

## 2021-07-17 LAB — I-STAT BETA HCG BLOOD, ED (MC, WL, AP ONLY): I-stat hCG, quantitative: <5 m[IU]/mL (ref <5)

## 2021-07-17 MED ORDER — POLYETHYLENE GLYCOL 3350 17 G PO PACK: 17.0000 g | PACK | Freq: Every day | ORAL | Status: DC | PRN

## 2021-07-17 MED ORDER — NALOXONE HCL 0.4 MG/ML IJ SOLN: 0.4000 mg | INTRAMUSCULAR | Status: DC | PRN

## 2021-07-17 MED ORDER — HYDROXYUREA 500 MG PO CAPS
500.0000 mg | ORAL_CAPSULE | Freq: Two times a day (BID) | ORAL | Status: DC
  Administered 2021-07-17 – 2021-07-19 (×5): 500 mg via ORAL
  Filled 2021-07-17 (×5): qty 1

## 2021-07-17 MED ORDER — SENNOSIDES-DOCUSATE SODIUM 8.6-50 MG PO TABS
1.0000 | ORAL_TABLET | Freq: Two times a day (BID) | ORAL | Status: DC
  Administered 2021-07-17 – 2021-07-19 (×5): 1 via ORAL
  Filled 2021-07-17 (×5): qty 1

## 2021-07-17 MED ORDER — KETOROLAC TROMETHAMINE 30 MG/ML IJ SOLN
30.0000 mg | Freq: Once | INTRAMUSCULAR | Status: AC
  Administered 2021-07-17: 30 mg via INTRAVENOUS
  Filled 2021-07-17: qty 1

## 2021-07-17 MED ORDER — SODIUM CHLORIDE 0.9% FLUSH: 9.0000 mL | INTRAVENOUS | Status: DC | PRN

## 2021-07-17 MED ORDER — FOLIC ACID 1 MG PO TABS
1.0000 mg | ORAL_TABLET | Freq: Every day | ORAL | Status: DC
Start: 2021-07-17 — End: 2021-07-19
  Administered 2021-07-17 – 2021-07-19 (×3): 1 mg via ORAL
  Filled 2021-07-17 (×3): qty 1

## 2021-07-17 MED ORDER — ONDANSETRON HCL 4 MG/2ML IJ SOLN: 4.0000 mg | INTRAMUSCULAR | Status: DC | PRN

## 2021-07-17 MED ORDER — GABAPENTIN 400 MG PO CAPS
400.0000 mg | ORAL_CAPSULE | Freq: Three times a day (TID) | ORAL | Status: DC
  Administered 2021-07-17 – 2021-07-19 (×7): 400 mg via ORAL
  Filled 2021-07-17 (×7): qty 1

## 2021-07-17 MED ORDER — HYDROMORPHONE HCL 2 MG/ML IJ SOLN
2.0000 mg | INTRAMUSCULAR | Status: AC
  Administered 2021-07-17: 2 mg via INTRAVENOUS
  Filled 2021-07-17: qty 1

## 2021-07-17 MED ORDER — ONDANSETRON HCL 4 MG PO TABS: 4.0000 mg | ORAL_TABLET | ORAL | Status: DC | PRN

## 2021-07-17 MED ORDER — HYDROMORPHONE 1 MG/ML IV SOLN
INTRAVENOUS | Status: DC
  Filled 2021-07-17: qty 30

## 2021-07-17 MED ORDER — HYDROMORPHONE HCL 2 MG/ML IJ SOLN
2.0000 mg | Freq: Once | INTRAMUSCULAR | Status: DC
  Filled 2021-07-17: qty 1

## 2021-07-17 MED ORDER — ENOXAPARIN SODIUM 40 MG/0.4ML IJ SOSY
40.0000 mg | PREFILLED_SYRINGE | INTRAMUSCULAR | Status: DC
  Administered 2021-07-17 – 2021-07-19 (×3): 40 mg via SUBCUTANEOUS
  Filled 2021-07-17 (×3): qty 0.4

## 2021-07-17 MED ORDER — HYDROMORPHONE 1 MG/ML IV SOLN
INTRAVENOUS | Status: DC
  Administered 2021-07-17: 2 mg via INTRAVENOUS
  Administered 2021-07-18: 6 mg via INTRAVENOUS
  Administered 2021-07-18: 1 mg via INTRAVENOUS
  Administered 2021-07-18: 0 mg via INTRAVENOUS
  Administered 2021-07-18: 4.5 mg via INTRAVENOUS
  Administered 2021-07-18 – 2021-07-19 (×2): 4 mg via INTRAVENOUS
  Administered 2021-07-19: 6 mg via INTRAVENOUS
  Administered 2021-07-19: 3.5 mg via INTRAVENOUS
  Filled 2021-07-17: qty 30

## 2021-07-17 MED ORDER — HYDROMORPHONE HCL 1 MG/ML IJ SOLN
1.0000 mg | Freq: Once | INTRAMUSCULAR | Status: AC
  Administered 2021-07-17: 1 mg via INTRAVENOUS
  Filled 2021-07-17: qty 1

## 2021-07-17 MED ORDER — DIPHENHYDRAMINE HCL 25 MG PO CAPS
25.0000 mg | ORAL_CAPSULE | ORAL | Status: DC | PRN
  Administered 2021-07-17 – 2021-07-19 (×2): 25 mg via ORAL
  Filled 2021-07-17: qty 1

## 2021-07-17 MED ORDER — ONDANSETRON HCL 4 MG/2ML IJ SOLN: 4.0000 mg | Freq: Four times a day (QID) | INTRAMUSCULAR | Status: DC | PRN

## 2021-07-17 MED ORDER — DEXTROSE-NACL 5-0.45 % IV SOLN
INTRAVENOUS | Status: DC
Start: 2021-07-17 — End: 2021-07-19
  Administered 2021-07-19: 1000 mL via INTRAVENOUS

## 2021-07-17 MED ORDER — OXYCODONE HCL ER 10 MG PO T12A
10.0000 mg | EXTENDED_RELEASE_TABLET | Freq: Two times a day (BID) | ORAL | Status: DC
  Administered 2021-07-17 – 2021-07-19 (×5): 10 mg via ORAL
  Filled 2021-07-17 (×5): qty 1

## 2021-07-17 MED ORDER — ACETAMINOPHEN 325 MG PO TABS
650.0000 mg | ORAL_TABLET | Freq: Four times a day (QID) | ORAL | Status: DC | PRN
  Administered 2021-07-17 – 2021-07-18 (×2): 650 mg via ORAL
  Filled 2021-07-17 (×2): qty 2

## 2021-07-17 MED ORDER — LACTATED RINGERS IV SOLN: INTRAVENOUS | Status: DC

## 2021-07-17 MED ORDER — KETOROLAC TROMETHAMINE 15 MG/ML IJ SOLN
15.0000 mg | Freq: Three times a day (TID) | INTRAMUSCULAR | Status: AC
  Administered 2021-07-17 (×2): 15 mg via INTRAVENOUS
  Filled 2021-07-17 (×2): qty 1

## 2021-07-17 NOTE — Assessment & Plan Note (Signed)
Lindsey [name] is admitted to Med Surg floor. ?IVF hydration with LR ?Dilaudid Q 3 hour for breakthru  pain control. Continue home oxycontin dose.  ?Toradol IV for 3 doses as needed. ?Sickle cell team to be called in am to see pt ?

## 2021-07-17 NOTE — Progress Notes (Signed)
?   07/17/21 1200  ?Assess: MEWS Score  ?Resp 13  ?SpO2 94 %  ?Assess: MEWS Score  ?MEWS Temp 0  ?MEWS Systolic 1  ?MEWS Pulse 0  ?MEWS RR 1  ?MEWS LOC 0  ?MEWS Score 2  ?MEWS Score Color Yellow  ?Treat  ?Pain Score 5  ?Assess: SIRS CRITERIA  ?SIRS Temperature  0  ?SIRS Pulse 0  ?SIRS Respirations  0  ?SIRS WBC 0  ?SIRS Score Sum  0  ? ?Patient is in pain, states she is ok. PCA pump usage encouraged. Made doctor aware, yellow MEWS not implemented at this time. Patient is still on admission vitals. ?

## 2021-07-17 NOTE — H&P (Signed)
?History and Physical  ? ? ?Patient: Lindsey [name] [id number] DOB: 1970-09-19 ?DOA: 07/16/2021 ?DOS: the patient was seen and examined on 07/17/2021 ?PCP: Massie Maroon, FNP  ?Patient coming from: Home ? ?Chief Complaint:  ?Chief Complaint  ?Patient presents with  ? Sickle Cell Pain Crisis  ? ?HPI: Lindsey [name] is a 51 y.o. female with medical history significant of sickle cell disease who presents with pain crisis with pain in her right shoulder, arm and right leg.  She states this is typical sickle cell pain for her.  She has been taking her home oxycodone and ibuprofen without relief.  She denies having any chest pain palpitation shortness of breath cough fever or chills.  She has no urinary symptoms.  Symptoms of been present for the last 2 days and progressively worsened.  Pain became so severe she could not tolerate MRI so she came to the emergency room for evaluation.  She has been seen in the ER and had admissions for sickle cell pain crisis in the past. ?Continues to vape.  Denies alcohol or illicit drug use. ? ?Review of Systems: As mentioned in the history of present illness. All other systems reviewed and are negative. ?Past Medical History:  ?Diagnosis Date  ? Acute kidney injury (HCC) 01/18/2021  ? Acute pain of left shoulder   ? Cough 06/27/2020  ? Hypokalemia 06/27/2020  ? Hypoxia   ? Leukocytosis 09/03/2016  ? Opiate abuse, episodic (HCC) 09/25/2017  ? Opioid dependence in remission [facility])   ? Right leg pain   ? Sickle cell crisis (HCC)   ? ?Past Surgical History:  ?Procedure Laterality Date  ? APPENDECTOMY    ? CESAREAN SECTION    ? OTHER SURGICAL HISTORY    ? c-section  ? ?Social History:  reports that she has been smoking cigars and cigarettes. She has never used smokeless tobacco. She reports current alcohol use. She reports that she does not use drugs. ? ?Allergies  ?Allergen Reactions  ? Ketamine Anxiety  ?  Anxiety, elevated HR  ? ? ?Family History  ?Problem Relation Age of  Onset  ? Stroke Neg Hx   ?     none that she knows of   ? Seizures Neg Hx   ? ? ?Prior to Admission medications   ?Medication Sig Start Date End Date Taking? Authorizing Provider  ?folic acid (FOLVITE) 1 MG tablet Take 1 tablet (1 mg total) by mouth daily. 03/30/21 03/30/22  Massie Maroon, FNP  ?gabapentin (NEURONTIN) 400 MG capsule TAKE 1 CAPSULE(400 MG) BY MOUTH THREE TIMES DAILY 05/20/21   Massie Maroon, FNP  ?ibuprofen (ADVIL) 800 MG tablet Take 1 tablet (800 mg total) by mouth every 8 (eight) hours as needed. ?Patient taking differently: Take 800 mg by mouth every 8 (eight) hours as needed for moderate pain. 05/20/21   Massie Maroon, FNP  ?oxyCODONE (OXYCONTIN) 10 mg 12 hr tablet Take 1 tablet (10 mg total) by mouth every 12 (twelve) hours. 07/03/21   Massie Maroon, FNP  ?Oxycodone HCl 10 MG TABS Take 1 tablet (10 mg total) by mouth every 6 (six) hours as needed for up to 15 days (pain). 07/06/21 07/21/21  Massie Maroon, FNP  ? ? ?Physical Exam: ?Vitals:  ? 07/17/21 0000 07/17/21 0030 07/17/21 0100 07/17/21 0104  ?BP: 102/62 106/68 104/61 101/62  ?Pulse: (!) 58 66 64 68  ?Resp: 13 14 10 11   ?Temp:      ?TempSrc:      ?  SpO2: 95% 96% 91% 96%  ?Weight:      ?Height:      ? ?General: WDWN, appears in pain and tearful. Alert and oriented x3.  ?Eyes: EOMI, PERRL, conjunctivae normal.  Sclera nonicteric ?HENT:  [place]/AT, external ears normal.  Nares patent without epistasis.  Mucous membranes are moist.  ?Neck: Soft, normal range of motion, supple, no masses, Trachea midline ?Respiratory: clear to auscultation bilaterally, no wheezing, no crackles. Normal respiratory effort.  ?Cardiovascular: Regular rate and rhythm, no murmurs / rubs / gallops. No extremity edema. ?Abdomen: Soft, no tenderness, nondistended, no rebound or guarding.  No masses palpated. Bowel sounds normoactive ?Musculoskeletal: FROM. no cyanosis.Normal muscle tone. Right shoulder tender to palpation, heating pad in place ?Skin: Warm, dry,  intact no rashes, lesions, ulcers. No induration ?Neurologic: CN 2-12 grossly intact.  Normal speech. Strength 5/5 in all extremities.   ?Psychiatric: Anxious mood.  ? ?Data Reviewed: ?Lab Work:   WBC 9600 hemoglobin 10.5 hematocrit 28.4 platelets 459,000 sodium 140 potassium 4.0 chloride 107 bicarb 26 creatinine 0.86 BUN 10 glucose 121 LFTs normal.  Pregnancy test negative.  COVID-negative, influenza A and B negative ? ?Assessment and Plan: ?Sickle cell pain crisis (HCC) ?Ms. Justiniano is admitted to Med Surg floor. ?IVF hydration with LR ?Dilaudid Q 3 hour for breakthru  pain control. Continue home oxycontin dose.  ?Toradol IV for 3 doses as needed. ?Sickle cell team to be called in am to see pt ? ?Chronic narcotic use ?stable ? ?Advance Care Planning:   Code Status: Full Code.  Lovenox for DVT prophylaxis ? ?Family Communication: Diagnosis and plan discussed with patient.  She verbalized understanding agrees with plan.  Further recommendations as clinical indicated ? ?Author: ?Carlton Adam, MD ?07/17/2021 2:49 AM ? ?For on call review [email or web address].  ?

## 2021-07-17 NOTE — Assessment & Plan Note (Signed)
stable °

## 2021-07-18 DIAGNOSIS — F1190 Opioid use, unspecified, uncomplicated: Secondary | ICD-10-CM

## 2021-07-18 NOTE — Progress Notes (Signed)
SICKLE CELL SERVICE ?PROGRESS NOTE ? ?Lindsey [name] [id number] DOB: August 15, 1970 DOA: 07/16/2021 ?PCP: Massie Maroon, FNP ? ?Assessment/Plan: ?Active Problems: ?  Sickle cell pain crisis (HCC) ?  Chronic narcotic use ? ?Sickle cell pain crisis: Patient is Dilaudid PCA has been changed to correct dose.  Continue with current dose with Toradol and IV fluids.  Titrate medications.  Continue oral agents. ?Anemia of chronic disease: H&H appears to be stable.  No transfusion at this point. ?Chronic pain syndrome: Secondary to sickle cell disease.  Continue chronic pain medications. ? ?Code Status: Full code ?Family Communication: No family at bedside ?Disposition Plan: Home ? ?[name],LAWAL  ?Pager [phone]. If 7PM-7AM, please contact night-coverage. ? ?07/18/2021, 10:16 AM  LOS: 1 day  ? ?Brief narrative: ?Patient is says she is feeling better.  Pain is down to 7 out of 10 today.  She is using the PCA.  No fever or chills no nausea vomiting or diarrhea. ? ?Consultants: ?None ? ?Procedures: ?None ? ?Antibiotics: ?None ? ?HPI/Subjective: ?Lindsey [name] is a 51 y.o. female with medical history significant of sickle cell disease who presents with pain crisis with pain in her right shoulder, arm and right leg.  She states this is typical sickle cell pain for her.  She has been taking her home oxycodone and ibuprofen without relief.  She denies having any chest pain palpitation shortness of breath cough fever or chills.  She has no urinary symptoms.  Symptoms of been present for the last 2 days and progressively worsened.  Pain became so severe she could not tolerate MRI so she came to the emergency room for evaluation.  She has been seen in the ER and had admissions for sickle cell pain crisis in the past. ?Continues to vape.  Denies alcohol or illicit drug use. ? ?Objective: ?Vitals:  ? 07/18/21 0302 07/18/21 0424 07/18/21 0453 07/18/21 0734  ?BP: 131/71  (!) 92/48   ?Pulse: (!) 59  (!) 59   ?Resp: 16 17 16  14   ?Temp: 98 ?F (36.7 ?C)  98.1 ?F (36.7 ?C)   ?TempSrc: Oral  Oral   ?SpO2: 97% 98% 94% 96%  ?Weight:   67.6 kg   ?Height:      ? ?Weight change: 4.097 kg ? ?Intake/Output Summary (Last 24 hours) at 07/18/2021 1016 ?Last data filed at 07/18/2021 0600 ?Gross per 24 hour  ?Intake 3186.63 ml  ?Output --  ?Net 3186.63 ml  ? ? ?General: Alert, awake, oriented x3, in no acute distress.  ?HEENT: [place]/AT PEERL, EOMI ?Neck: Trachea midline,  no masses, no thyromegal,y no JVD, no carotid bruit ?OROPHARYNX:  Moist, No exudate/ erythema/lesions.  ?Heart: Regular rate and rhythm, without murmurs, rubs, gallops, PMI non-displaced, no heaves or thrills on palpation.  ?Lungs: Clear to auscultation, no wheezing or rhonchi noted. No increased vocal fremitus resonant to percussion  ?Abdomen: Soft, nontender, nondistended, positive bowel sounds, no masses no hepatosplenomegaly noted.09/17/2021  ?Neuro: No focal neurological deficits noted cranial nerves II through XII grossly intact. DTRs 2+ bilaterally upper and lower extremities. Strength 5 out of 5 in bilateral upper and lower extremities. ?Musculoskeletal: No warm swelling or erythema around joints, no spinal tenderness noted. ?Psychiatric: Patient alert and oriented x3, good insight and cognition, good recent to remote recall. ?Lymph node survey: No cervical axillary or inguinal lymphadenopathy noted. ? ? ?Data Reviewed: ?Basic Metabolic Panel: ?Recent Labs  ?Lab 07/16/21 ?2236  ?NA 140  ?K 4.0  ?CL 107  ?CO2 26  ?GLUCOSE  121*  ?BUN 10  ?CREATININE 0.86  ?CALCIUM 9.1  ? ?Liver Function Tests: ?Recent Labs  ?Lab 07/16/21 ?2236  ?AST 18  ?ALT 14  ?ALKPHOS 54  ?BILITOT 1.2  ?PROT 7.8  ?ALBUMIN 4.5  ? ?No results for input(s): LIPASE, AMYLASE in the last 168 hours. ?No results for input(s): AMMONIA in the last 168 hours. ?CBC: ?Recent Labs  ?Lab 07/16/21 ?2236  ?WBC 9.6  ?NEUTROABS 3.6  ?HGB 10.5*  ?HCT 28.4*  ?MCV 86.1  ?PLT 459*  ? ?Cardiac Enzymes: ?No results for input(s): CKTOTAL, CKMB,  CKMBINDEX, TROPONINI in the last 168 hours. ?BNP (last 3 results) ?No results for input(s): BNP in the last 8760 hours. ? ?ProBNP (last 3 results) ?No results for input(s): PROBNP in the last 8760 hours. ? ?CBG: ?No results for input(s): GLUCAP in the last 168 hours. ? ?Recent Results (from the past 240 hour(s))  ?Resp Panel by RT-PCR (Flu A&B, Covid) Nasopharyngeal Swab     Status: None  ? Collection Time: 07/16/21 11:07 PM  ? Specimen: Nasopharyngeal Swab; Nasopharyngeal(NP) swabs in vial transport medium  ?Result Value Ref Range Status  ? SARS Coronavirus 2 by RT PCR NEGATIVE NEGATIVE Final  ?  Comment: (NOTE) ?SARS-CoV-2 target nucleic acids are NOT DETECTED. ? ?The SARS-CoV-2 RNA is generally detectable in upper respiratory ?specimens during the acute phase of infection. The lowest ?concentration of SARS-CoV-2 viral copies this assay can detect is ?138 copies/mL. A negative result does not preclude SARS-Cov-2 ?infection and should not be used as the sole basis for treatment or ?other patient management decisions. A negative result may occur with  ?improper specimen collection/handling, submission of specimen other ?than nasopharyngeal swab, presence of viral mutation(s) within the ?areas targeted by this assay, and inadequate number of viral ?copies(<138 copies/mL). A negative result must be combined with ?clinical observations, patient history, and epidemiological ?information. The expected result is Negative. ? ?Fact Sheet for Patients:  ?BloggerCourse.com ? ?Fact Sheet for Healthcare Providers:  ?SeriousBroker.it ? ?This test is no t yet approved or cleared by the [place] FDA and  ?has been authorized for detection and/or diagnosis of SARS-CoV-2 by ?FDA under an Emergency Use Authorization (EUA). This EUA will remain  ?in effect (meaning this test can be used) for the duration of the ?COVID-19 declaration under Section 564(b)(1) of the Act,  21 ?U.S.C.section 360bbb-3(b)(1), unless the authorization is terminated  ?or revoked sooner.  ? ? ?  ? Influenza A by PCR NEGATIVE NEGATIVE Final  ? Influenza B by PCR NEGATIVE NEGATIVE Final  ?  Comment: (NOTE) ?The Xpert Xpress SARS-CoV-2/FLU/RSV plus assay is intended as an aid ?in the diagnosis of influenza from Nasopharyngeal swab specimens and ?should not be used as a sole basis for treatment. Nasal washings and ?aspirates are unacceptable for Xpert Xpress SARS-CoV-2/FLU/RSV ?testing. ? ?Fact Sheet for Patients: ?BloggerCourse.com ? ?Fact Sheet for Healthcare Providers: ?SeriousBroker.it ? ?This test is not yet approved or cleared by the [place] FDA and ?has been authorized for detection and/or diagnosis of SARS-CoV-2 by ?FDA under an Emergency Use Authorization (EUA). This EUA will remain ?in effect (meaning this test can be used) for the duration of the ?COVID-19 declaration under Section 564(b)(1) of the Act, 21 U.S.C. ?section 360bbb-3(b)(1), unless the authorization is terminated or ?revoked. ? ?Performed at [facility], [address] ?[place], [place] [address] ?  ?  ? ?Studies: ?No results found. ? ?Scheduled Meds: ? enoxaparin (LOVENOX) injection  40 mg Subcutaneous Q24H  ?  folic acid  1 mg Oral Daily  ? gabapentin  400 mg Oral TID  ? HYDROmorphone   Intravenous Q4H  ? hydroxyurea  500 mg Oral BID  ? oxyCODONE  10 mg Oral Q12H  ? senna-docusate  1 tablet Oral BID  ? ?Continuous Infusions: ? dextrose 5 % and 0.45% NaCl 125 mL/hr at 07/18/21 0955  ? ? ?Active Problems: ?  Sickle cell pain crisis (HCC) ?  Chronic narcotic use ? ? ? ? ? ? ? ? ? ? ? ?  ?

## 2021-07-18 NOTE — TOC Initial Note (Signed)
Transition of Care (TOC) - Initial/Assessment Note  ? ? ?Patient Details  ?Name: Lindsey [name] ?MRN: [id number] ?Date of Birth: 1971/03/16 ? ?Transition of Care (TOC) CM/SW Contact:    ?Cecille Po, RN ?Phone Number: ?07/18/2021, 2:33 PM ? ?Clinical Narrative:                 ? ? ? ?Transition of Care (TOC) Screening Note ? ? ?Patient Details  ?Name: Lindsey [name] ?Date of Birth: July 21, 1970 ? ? ?Transition of Care (TOC) CM/SW Contact:    ?Cecille Po, RN ?Phone Number: ?07/18/2021, 2:33 PM ? ? ? ?Transition of Care Department [facility]) has reviewed patient and no TOC needs have been identified at this time. We will continue to monitor patient advancement through interdisciplinary progression rounds. If new patient transition needs arise, please place a TOC consult. ?  ? ?Expected Discharge Plan: Home/Self Care ?Barriers to Discharge: Continued Medical Work up ? ? ? ? ?Expected Discharge Plan and Services ?Expected Discharge Plan: Home/Self Care ?  ?  ?  ?Living arrangements for the past 2 months: Single Family Home ?                ?  ? ?Prior Living Arrangements/Services ?Living arrangements for the past 2 months: Single Family Home ?Lives with:: Self ?Patient language and need for interpreter reviewed:: Yes ?Do you feel safe going back to the place where you live?: Yes      ?Need for Family Participation in Patient Care: Yes (Comment) ?Care giver support system in place?: Yes (comment) ?  ?Criminal Activity/Legal Involvement Pertinent to Current Situation/Hospitalization: No - Comment as needed ? ?  ? ?  ?Alcohol / Substance Use: Not Applicable ?Psych Involvement: No (comment) ? ?Admission diagnosis:  Sickle cell pain crisis (HCC) [D57.00] ?Patient Active Problem List  ? Diagnosis Date Noted  ? Sickle cell pain crisis (HCC) 07/17/2021  ? Sickle cell crisis (HCC) 06/19/2021  ? Sickle cell anemia (HCC) 09/10/2019  ? Current mild episode of major depressive disorder (HCC)   ? Sickle cell disease  (HCC) 10/01/2017  ? Chronic pain 10/01/2017  ? Chronic narcotic use 09/25/2017  ? ?PCP:  Massie Maroon, FNP ?Pharmacy:   ?[id number] - [place], [place] - [address] AT [facility] OF [address] ?[address] ?[place] [address] ?Phone: [phone] Fax: [phone] ? ?[id number] - [place], [place] - [address] AT [address] ?[address] ?[place] [address] ?Phone: [phone] Fax: [phone] ? ?[id number] [place], [place] - [address] AT [facility] OF [address] ?[address] ? [place] [address] ?Phone: [phone] Fax: [phone] ? ? ? ? ? ?Readmission Risk Interventions ?Readmission Risk Prevention Plan 07/18/2021  ?Transportation Screening Complete  ?PCP or Specialist Appt within 3-5 Days Complete  ?HRI or Home Care Consult Complete  ?Social Work Consult for Recovery Care Planning/Counseling Complete  ?Palliative Care Screening Not Applicable  ?Medication Review Oceanographer) Complete  ?Some recent data might be hidden  ? ? ? ?

## 2021-07-19 MED ORDER — OXYCODONE HCL 10 MG PO TABS: 10.0000 mg | ORAL_TABLET | Freq: Four times a day (QID) | ORAL | 0 refills | Status: DC | PRN

## 2021-07-19 NOTE — Progress Notes (Signed)
Discharge instructions given to Patient, she said she didn't need to read it because she already knows what it said. [name] put it in her bag. She was discharged via wheelchair to her husband. ?

## 2021-07-21 NOTE — Discharge Summary (Signed)
Physician Discharge Summary  ?Lindsey [name] RUE:[id number] DOB: [birth year] DOA: 07/16/2021 ? ?PCP: Massie MaroonHollis, Lumina Gitto [name], Lindsey [name] ? ?Admit date: 07/16/2021 ? ?Discharge date: 07/21/2021 ? ?Discharge Diagnoses:  ?Active Problems: ?  Chronic narcotic use ?  Sickle cell pain crisis (HCC) ? ? ?Discharge Condition: Stable ? ?Disposition:  ?Pt is discharged home in good condition and is to follow up with Massie MaroonHollis, Lindsey Swamy [name], Lindsey [name] this week to have labs evaluated. Lindsey [name] is instructed to increase activity slowly and balance with rest for the next few days, and use prescribed medication to complete treatment of pain ? ?Diet: Regular ?Wt Readings from Last 3 Encounters:  ?07/18/21 67.6 kg  ?06/19/21 63.5 kg  ?05/31/21 61.7 kg  ? ? ?History of present illness:  ?Lindsey [name] is a 51 year old female with a medical history significant for sickle cell disease who presents with pain crisis and right shoulder, arms, and right lower extremity.  She states that this is her typical sickle cell pain crises.  She has been taking her home oxycodone and OxyContin, and ibuprofen without sustained relief.  She denies having chest pain, palpitations, shortness of breath, cough, fever, or chills.  She has no urinary symptoms.  Symptoms have been present over the past 2 days and progressively worsening.  Pain became so severe she could not tolerate, so she came to the emergency room for evaluation.  She has been seen in the ER, and has had admissions for sickle cell pain crisis in the past. ?Patient continues to vape.  She denies any alcohol or illicit drug use. ? ?Hospital Course:  ?Sickle cell disease with pain crisis: ?Patient was admitted for sickle cell pain crisis and managed appropriately with IVF, IV Dilaudid via PCA and IV Toradol, as well as other adjunct therapies per sickle cell pain management protocols.  Patient's pain intensity has decreased to 3/10.  She states that she can manage at home on current medication  regimen.  Review PDMP, no inconsistencies noted.  Oxycodone 10 mg every 6 hours #60 was sent to patient's pharmacy.  Patient will follow-up in primary care as scheduled to repeat CBC with differential and CMP.  Also, patient will make a first available appointment for medication management.  She is interested in starting Suboxone therapy for pain control as opposed to current opiate medication regimen. ? ?Patient was therefore discharged home today in a hemodynamically stable condition.  ? ?Lindsey [name] will follow-up with PCP within 1 week of this discharge. Lindsey [name] was counseled extensively about nonpharmacologic means of pain management, patient verbalized understanding and was appreciative of  the care received during this admission.  ? ?We discussed the need for good hydration, monitoring of hydration status, avoidance of heat, cold, stress, and infection triggers. We discussed the need to be adherent with her home medications. Patient was reminded of the need to seek medical attention immediately if any symptom of bleeding, anemia, or infection occurs. ? ?Discharge Exam: ?Vitals:  ? 07/19/21 1226 07/19/21 1322  ?BP:  110/79  ?Pulse:  76  ?Resp: 16 16  ?Temp:  98.4 ?F (36.9 ?C)  ?SpO2: 98% 99%  ? ?Vitals:  ? 07/19/21 0800 07/19/21 0933 07/19/21 1226 07/19/21 1322  ?BP:  115/83  110/79  ?Pulse:  65  76  ?Resp: 16 14 16 16   ?Temp:  98.5 ?F (36.9 ?C)  98.4 ?F (36.9 ?C)  ?TempSrc:  Oral  Oral  ?SpO2: 96% 97% 98% 99%  ?Weight:      ?Height:      ? ? ?  General appearance : Awake, alert, not in any distress. Speech Clear. Not toxic looking ?HEENT: Atraumatic and Normocephalic, pupils equally reactive to light and accomodation ?Neck: Supple, no JVD. No cervical lymphadenopathy.  ?Chest: Good air entry bilaterally, no added sounds  ?CVS: S1 S2 regular, no murmurs.  ?Abdomen: Bowel sounds present, Non tender and not distended with no gaurding, rigidity or rebound. ?Extremities: B/L Lower Ext shows no edema, both legs are  warm to touch ?Neurology: Awake alert, and oriented X 3, CN II-XII intact, Non focal ?Skin: No Rash ? ?Discharge Instructions ? ?Discharge Instructions   ? ? Discharge patient   Complete by: As directed ?  ? Discharge disposition: 01-Home or Self Care  ? Discharge patient date: 07/19/2021  ? ?  ? ?Allergies as of 07/19/2021   ? ?   Reactions  ? Ketamine Anxiety  ? Anxiety, elevated HR  ? ?  ? ?  ?Medication List  ?  ? ?TAKE these medications   ? ?folic acid 1 MG tablet ?Commonly known as: FOLVITE ?Take 1 tablet (1 mg total) by mouth daily. ?  ?gabapentin 400 MG capsule ?Commonly known as: NEURONTIN ?TAKE 1 CAPSULE(400 MG) BY MOUTH THREE TIMES DAILY ?  ?ibuprofen 800 MG tablet ?Commonly known as: ADVIL ?Take 1 tablet (800 mg total) by mouth every 8 (eight) hours as needed. ?What changed: reasons to take this ?  ?Melatonin 10 MG Tabs ?Take 1 tablet by mouth at bedtime. ?  ?oxyCODONE 10 mg 12 hr tablet ?Commonly known as: OXYCONTIN ?Take 1 tablet (10 mg total) by mouth every 12 (twelve) hours. ?  ?Oxycodone HCl 10 MG Tabs ?Take 1 tablet (10 mg total) by mouth every 6 (six) hours as needed for up to 15 days (pain). ?  ? ?  ? ? ?The results of significant diagnostics from this hospitalization (including imaging, microbiology, ancillary and laboratory) are listed below for reference.   ? ?Significant Diagnostic Studies: ?No results found. ? ?Microbiology: ?Recent Results (from the past 240 hour(s))  ?Resp Panel by RT-PCR (Flu A&B, Covid) Nasopharyngeal Swab     Status: None  ? Collection Time: 07/16/21 11:07 PM  ? Specimen: Nasopharyngeal Swab; Nasopharyngeal(NP) swabs in vial transport medium  ?Result Value Ref Range Status  ? SARS Coronavirus 2 by RT PCR NEGATIVE NEGATIVE Final  ?  Comment: (NOTE) ?SARS-CoV-2 target nucleic acids are NOT DETECTED. ? ?The SARS-CoV-2 RNA is generally detectable in upper respiratory ?specimens during the acute phase of infection. The lowest ?concentration of SARS-CoV-2 viral copies this  assay can detect is ?138 copies/mL. A negative result does not preclude SARS-Cov-2 ?infection and should not be used as the sole basis for treatment or ?other patient management decisions. A negative result may occur with  ?improper specimen collection/handling, submission of specimen other ?than nasopharyngeal swab, presence of viral mutation(s) within the ?areas targeted by this assay, and inadequate number of viral ?copies(<138 copies/mL). A negative result must be combined with ?clinical observations, patient history, and epidemiological ?information. The expected result is Negative. ? ?Fact Sheet for Patients:  ?BloggerCourse.com ? ?Fact Sheet for Healthcare Providers:  ?SeriousBroker.it ? ?This test is no t yet approved or cleared by the [place] FDA and  ?has been authorized for detection and/or diagnosis of SARS-CoV-2 by ?FDA under an Emergency Use Authorization (EUA). This EUA will remain  ?in effect (meaning this test can be used) for the duration of the ?COVID-19 declaration under Section 564(b)(1) of the Act, 21 ?U.S.C.section 360bbb-3(b)(1), unless the authorization is  terminated  ?or revoked sooner.  ? ? ?  ? Influenza A by PCR NEGATIVE NEGATIVE Final  ? Influenza B by PCR NEGATIVE NEGATIVE Final  ?  Comment: (NOTE) ?The Xpert Xpress SARS-CoV-2/FLU/RSV plus assay is intended as an aid ?in the diagnosis of influenza from Nasopharyngeal swab specimens and ?should not be used as a sole basis for treatment. Nasal washings and ?aspirates are unacceptable for Xpert Xpress SARS-CoV-2/FLU/RSV ?testing. ? ?Fact Sheet for Patients: ?BloggerCourse.com ? ?Fact Sheet for Healthcare Providers: ?SeriousBroker.it ? ?This test is not yet approved or cleared by the [place] FDA and ?has been authorized for detection and/or diagnosis of SARS-CoV-2 by ?FDA under an Emergency Use Authorization (EUA). This EUA will  remain ?in effect (meaning this test can be used) for the duration of the ?COVID-19 declaration under Section 564(b)(1) of the Act, 21 U.S.C. ?section 360bbb-3(b)(1), unless the authorization is terminated or ?

## 2021-07-22 ENCOUNTER — Other Ambulatory Visit: Payer: Self-pay | Admitting: Family Medicine

## 2021-07-22 ENCOUNTER — Telehealth: Payer: Self-pay

## 2021-07-22 NOTE — Telephone Encounter (Deleted)
Oxycodone  °

## 2021-07-22 NOTE — Telephone Encounter (Incomplete Revision)
Oxycodone  ? ?She stated that she only got a portion of her med's due to pharmacy shortage! ?

## 2021-07-26 NOTE — Telephone Encounter (Signed)
Oxycontin 

## 2021-07-27 ENCOUNTER — Other Ambulatory Visit: Payer: Self-pay | Admitting: Family Medicine

## 2021-07-27 DIAGNOSIS — G894 Chronic pain syndrome: Secondary | ICD-10-CM

## 2021-07-27 MED ORDER — OXYCODONE HCL ER 10 MG PO T12A: 10.0000 mg | EXTENDED_RELEASE_TABLET | Freq: Two times a day (BID) | ORAL | 0 refills | Status: DC

## 2021-07-27 NOTE — Progress Notes (Signed)
Reviewed PDMP substance reporting system prior to prescribing opiate medications. No inconsistencies noted.  Meds ordered this encounter  Medications   oxyCODONE (OXYCONTIN) 10 mg 12 hr tablet    Sig: Take 1 tablet (10 mg total) by mouth every 12 (twelve) hours.    Dispense:  60 tablet    Refill:  0    Order Specific Question:   Supervising Provider    Answer:   [name], [name]      Donnika Kucher Moore Kryslyn Helbig  APRN, MSN, FNP-C [facility] [address]  [place], [place] [address] [phone]  

## 2021-08-02 ENCOUNTER — Telehealth: Payer: Self-pay | Admitting: Family Medicine

## 2021-08-02 ENCOUNTER — Other Ambulatory Visit: Payer: Self-pay | Admitting: Family Medicine

## 2021-08-02 DIAGNOSIS — G894 Chronic pain syndrome: Secondary | ICD-10-CM

## 2021-08-02 MED ORDER — OXYCODONE HCL 10 MG PO TABS: 10.0000 mg | ORAL_TABLET | Freq: Four times a day (QID) | ORAL | 0 refills | Status: DC | PRN

## 2021-08-02 NOTE — Telephone Encounter (Signed)
Refill request for oxycodone.  

## 2021-08-02 NOTE — Progress Notes (Signed)
Reviewed PDMP substance reporting system prior to prescribing opiate medications. No inconsistencies noted.  Meds ordered this encounter  Medications   Oxycodone HCl 10 MG TABS    Sig: Take 1 tablet (10 mg total) by mouth every 6 (six) hours as needed for up to 15 days (pain).    Dispense:  60 tablet    Refill:  0    Order Specific Question:   Supervising Provider    Answer:   [name], [name]   Lindsey Halabi Moore Yan Pankratz  APRN, MSN, FNP-C [facility] [address]  [place],  [address] [phone]  

## 2021-08-06 ENCOUNTER — Ambulatory Visit: Payer: Medicaid Other | Admitting: Nurse Practitioner

## 2021-08-15 ENCOUNTER — Other Ambulatory Visit: Payer: Self-pay

## 2021-08-15 ENCOUNTER — Observation Stay (HOSPITAL_COMMUNITY): Payer: Medicaid Other

## 2021-08-15 ENCOUNTER — Inpatient Hospital Stay (HOSPITAL_COMMUNITY)
Admission: EM | Admit: 2021-08-15 | Discharge: 2021-08-16 | DRG: 812 | Disposition: A | Discharge status: Home / Self Care | Payer: Medicaid Other | Attending: Internal Medicine | Admitting: Internal Medicine

## 2021-08-15 ENCOUNTER — Encounter (HOSPITAL_COMMUNITY): Payer: Self-pay

## 2021-08-15 DIAGNOSIS — D5700 Hb-SS disease with crisis, unspecified: Principal | ICD-10-CM | POA: Diagnosis present

## 2021-08-15 DIAGNOSIS — F1121 Opioid dependence, in remission: Secondary | ICD-10-CM | POA: Diagnosis present

## 2021-08-15 DIAGNOSIS — F17290 Nicotine dependence, other tobacco product, uncomplicated: Secondary | ICD-10-CM | POA: Diagnosis present

## 2021-08-15 DIAGNOSIS — Z79899 Other long term (current) drug therapy: Secondary | ICD-10-CM

## 2021-08-15 LAB — CBC WITH DIFFERENTIAL/PLATELET
Abs Immature Granulocytes: 0.03 10*3/uL (ref 0.00–0.07)
Abs Immature Granulocytes: 0.02 10*3/uL (ref 0.00–0.07)
Basophils Absolute: 0.1 10*3/uL (ref 0.0–0.1)
Basophils Absolute: 0 10*3/uL (ref 0.0–0.1)
Basophils Relative: 1 %
Basophils Relative: 1 %
Eosinophils Absolute: 0.7 10*3/uL — ABNORMAL HIGH (ref 0.0–0.5)
Eosinophils Absolute: 0.6 10*3/uL — ABNORMAL HIGH (ref 0.0–0.5)
Eosinophils Relative: 6 %
Eosinophils Relative: 7 %
HCT: 27.4 % — ABNORMAL LOW (ref 36.0–46.0)
HCT: 23.6 % — ABNORMAL LOW (ref 36.0–46.0)
Hemoglobin: 10 g/dL — ABNORMAL LOW (ref 12.0–15.0)
Hemoglobin: 8.7 g/dL — ABNORMAL LOW (ref 12.0–15.0)
Immature Granulocytes: 0 %
Immature Granulocytes: 0 %
Lymphocytes Relative: 48 %
Lymphocytes Relative: 48 %
Lymphs Abs: 5.3 10*3/uL — ABNORMAL HIGH (ref 0.7–4.0)
Lymphs Abs: 4.2 10*3/uL — ABNORMAL HIGH (ref 0.7–4.0)
MCH: 31.8 pg (ref 26.0–34.0)
MCH: 32.1 pg (ref 26.0–34.0)
MCHC: 36.5 g/dL — ABNORMAL HIGH (ref 30.0–36.0)
MCHC: 36.9 g/dL — ABNORMAL HIGH (ref 30.0–36.0)
MCV: 87.3 fL (ref 80.0–100.0)
MCV: 87.1 fL (ref 80.0–100.0)
Monocytes Absolute: 0.7 10*3/uL (ref 0.1–1.0)
Monocytes Absolute: 0.7 10*3/uL (ref 0.1–1.0)
Monocytes Relative: 7 %
Monocytes Relative: 8 %
Neutro Abs: 4.1 10*3/uL (ref 1.7–7.7)
Neutro Abs: 3.1 10*3/uL (ref 1.7–7.7)
Neutrophils Relative %: 38 %
Neutrophils Relative %: 36 %
Platelets: 397 10*3/uL (ref 150–400)
Platelets: 337 10*3/uL (ref 150–400)
RBC: 3.14 MIL/uL — ABNORMAL LOW (ref 3.87–5.11)
RBC: 2.71 MIL/uL — ABNORMAL LOW (ref 3.87–5.11)
RDW: 14.3 % (ref 11.5–15.5)
RDW: 13.8 % (ref 11.5–15.5)
WBC: 10.8 10*3/uL — ABNORMAL HIGH (ref 4.0–10.5)
WBC: 8.7 10*3/uL (ref 4.0–10.5)
nRBC: 0.5 % — ABNORMAL HIGH (ref 0.0–0.2)
nRBC: 0.7 % — ABNORMAL HIGH (ref 0.0–0.2)

## 2021-08-15 LAB — COMPREHENSIVE METABOLIC PANEL
ALT: 20 U/L (ref 0–44)
ALT: 18 U/L (ref 0–44)
AST: 29 U/L (ref 15–41)
AST: 24 U/L (ref 15–41)
Albumin: 4.3 g/dL (ref 3.5–5.0)
Albumin: 4 g/dL (ref 3.5–5.0)
Alkaline Phosphatase: 47 U/L (ref 38–126)
Alkaline Phosphatase: 43 U/L (ref 38–126)
Anion gap: 4 — ABNORMAL LOW (ref 5–15)
Anion gap: 5 (ref 5–15)
BUN: 12 mg/dL (ref 6–20)
BUN: 12 mg/dL (ref 6–20)
CO2: 30 mmol/L (ref 22–32)
CO2: 28 mmol/L (ref 22–32)
Calcium: 9.3 mg/dL (ref 8.9–10.3)
Calcium: 8.8 mg/dL — ABNORMAL LOW (ref 8.9–10.3)
Chloride: 109 mmol/L (ref 98–111)
Chloride: 107 mmol/L (ref 98–111)
Creatinine, Ser: 0.84 mg/dL (ref 0.44–1.00)
Creatinine, Ser: 0.84 mg/dL (ref 0.44–1.00)
GFR, Estimated: >60 mL/min (ref >60)
GFR, Estimated: >60 mL/min (ref >60)
Glucose, Bld: 97 mg/dL (ref 70–99)
Glucose, Bld: 85 mg/dL (ref 70–99)
Potassium: 4.3 mmol/L (ref 3.5–5.1)
Potassium: 4.1 mmol/L (ref 3.5–5.1)
Sodium: 143 mmol/L (ref 135–145)
Sodium: 140 mmol/L (ref 135–145)
Total Bilirubin: 1.3 mg/dL — ABNORMAL HIGH (ref 0.3–1.2)
Total Bilirubin: 1.2 mg/dL (ref 0.3–1.2)
Total Protein: 7.7 g/dL (ref 6.5–8.1)
Total Protein: 6.7 g/dL (ref 6.5–8.1)

## 2021-08-15 LAB — RETICULOCYTES
Immature Retic Fract: 30.8 % — ABNORMAL HIGH (ref 2.3–15.9)
Immature Retic Fract: 32.9 % — ABNORMAL HIGH (ref 2.3–15.9)
RBC.: 3.09 MIL/uL — ABNORMAL LOW (ref 3.87–5.11)
RBC.: 2.7 MIL/uL — ABNORMAL LOW (ref 3.87–5.11)
Retic Count, Absolute: 158.5 10*3/uL (ref 19.0–186.0)
Retic Count, Absolute: 136.4 10*3/uL (ref 19.0–186.0)
Retic Ct Pct: 5.1 % — ABNORMAL HIGH (ref 0.4–3.1)
Retic Ct Pct: 5.1 % — ABNORMAL HIGH (ref 0.4–3.1)

## 2021-08-15 LAB — I-STAT BETA HCG BLOOD, ED (MC, WL, AP ONLY): I-stat hCG, quantitative: <5 m[IU]/mL (ref <5)

## 2021-08-15 LAB — MAGNESIUM: Magnesium: 2 mg/dL (ref 1.7–2.4)

## 2021-08-15 MED ORDER — DIPHENHYDRAMINE HCL 25 MG PO CAPS
25.0000 mg | ORAL_CAPSULE | ORAL | Status: DC | PRN
  Administered 2021-08-15: 25 mg via ORAL
  Filled 2021-08-15: qty 1

## 2021-08-15 MED ORDER — ONDANSETRON HCL 4 MG/2ML IJ SOLN
4.0000 mg | INTRAMUSCULAR | Status: DC | PRN
  Administered 2021-08-15: 4 mg via INTRAVENOUS
  Filled 2021-08-15: qty 2

## 2021-08-15 MED ORDER — HYDROMORPHONE HCL 2 MG/ML IJ SOLN
2.0000 mg | INTRAMUSCULAR | Status: AC
  Administered 2021-08-15: 2 mg via INTRAVENOUS
  Filled 2021-08-15: qty 1

## 2021-08-15 MED ORDER — HYDROMORPHONE HCL 1 MG/ML IJ SOLN
1.0000 mg | INTRAMUSCULAR | Status: DC | PRN
Start: 2021-08-15 — End: 2021-08-15

## 2021-08-15 MED ORDER — DEXTROSE-NACL 5-0.45 % IV SOLN
INTRAVENOUS | Status: DC
  Administered 2021-08-16: 1000 mL via INTRAVENOUS

## 2021-08-15 MED ORDER — MENTHOL 3 MG MT LOZG
1.0000 | LOZENGE | OROMUCOSAL | Status: DC | PRN
  Filled 2021-08-15: qty 9

## 2021-08-15 MED ORDER — ACETAMINOPHEN 650 MG RE SUPP: 650.0000 mg | Freq: Four times a day (QID) | RECTAL | Status: DC | PRN

## 2021-08-15 MED ORDER — HYDROMORPHONE 1 MG/ML IV SOLN
INTRAVENOUS | Status: DC
  Administered 2021-08-15: 4 mg via INTRAVENOUS
  Administered 2021-08-15: 5.5 mg via INTRAVENOUS
  Administered 2021-08-15: 0.1 mg via INTRAVENOUS
  Administered 2021-08-16: 14 mg via INTRAVENOUS
  Administered 2021-08-16: 4 mg via INTRAVENOUS
  Filled 2021-08-15: qty 30

## 2021-08-15 MED ORDER — HYDROMORPHONE HCL 1 MG/ML IJ SOLN: 1.0000 mg | INTRAMUSCULAR | Status: DC | PRN

## 2021-08-15 MED ORDER — NALOXONE HCL 0.4 MG/ML IJ SOLN: 0.4000 mg | INTRAMUSCULAR | Status: DC | PRN

## 2021-08-15 MED ORDER — SODIUM CHLORIDE 0.9 % IV SOLN
12.5000 mg | Freq: Once | INTRAVENOUS | Status: AC
  Administered 2021-08-15: 12.5 mg via INTRAVENOUS
  Filled 2021-08-15: qty 12.5

## 2021-08-15 MED ORDER — GABAPENTIN 400 MG PO CAPS
400.0000 mg | ORAL_CAPSULE | Freq: Three times a day (TID) | ORAL | Status: DC
  Administered 2021-08-15 – 2021-08-16 (×4): 400 mg via ORAL
  Filled 2021-08-15 (×4): qty 1

## 2021-08-15 MED ORDER — SODIUM CHLORIDE 0.45 % IV SOLN: INTRAVENOUS | Status: DC

## 2021-08-15 MED ORDER — ACETAMINOPHEN 325 MG PO TABS
650.0000 mg | ORAL_TABLET | Freq: Four times a day (QID) | ORAL | Status: DC | PRN
  Administered 2021-08-15: 325 mg via ORAL
  Filled 2021-08-15: qty 2

## 2021-08-15 MED ORDER — SODIUM CHLORIDE 0.9% FLUSH: 9.0000 mL | INTRAVENOUS | Status: DC | PRN

## 2021-08-15 MED ORDER — HYDROMORPHONE HCL 1 MG/ML IJ SOLN
1.0000 mg | Freq: Once | INTRAMUSCULAR | Status: AC
  Administered 2021-08-15: 1 mg via INTRAVENOUS
  Filled 2021-08-15: qty 1

## 2021-08-15 MED ORDER — KETOROLAC TROMETHAMINE 15 MG/ML IJ SOLN: 15.0000 mg | Freq: Four times a day (QID) | INTRAMUSCULAR | Status: DC | PRN

## 2021-08-15 MED ORDER — NALOXONE HCL 0.4 MG/ML IJ SOLN
0.4000 mg | INTRAMUSCULAR | Status: DC | PRN
Start: 2021-08-15 — End: 2021-08-16

## 2021-08-15 MED ORDER — HYDROMORPHONE 1 MG/ML IV SOLN
INTRAVENOUS | Status: DC
  Filled 2021-08-15: qty 30

## 2021-08-15 MED ORDER — OXYCODONE HCL 5 MG PO TABS
10.0000 mg | ORAL_TABLET | Freq: Four times a day (QID) | ORAL | Status: DC | PRN
  Administered 2021-08-15 – 2021-08-16 (×5): 10 mg via ORAL
  Filled 2021-08-15 (×5): qty 2

## 2021-08-15 NOTE — H&P (Signed)
?History and Physical  ? ? ?PLEASE NOTE THAT DRAGON DICTATION SOFTWARE WAS USED IN THE CONSTRUCTION OF THIS NOTE. ? ? ?Lindsey SonsShawunder Raye Runkles ZOX:[id number] DOB: 1970-06-13 DOA: 08/15/2021 ? ?PCP: Massie MaroonHollis, Lachina M, FNP  ?Patient coming from: home  ? ?I have personally briefly reviewed [facility] ? ?Chief Complaint: Low back pain ? ?HPI: Lindsey [name] is a 51 y.o. female with medical history significant for sickle cell disease with multiple prior hospitalizations for sickle cell pain crisis who is [facility]21 with acute sickle cell pain crisis after [facility] complaining of low back pain.  ? ?The patient reports 1 to 2 days of midline low back pain as well as left upper extremity discomfort involving the left shoulder and left elbow, in distribution, quality, intensity very similar to that which she has experienced at times of her previous acute sickle cell pain crises requiring hospitalization.  Denies any recent/preceding trauma.  She notes that the pain is not very and denies any associated flank discomfort nor any recent dysuria, gross hematuria, or change in urinary urgency/frequency.  Not associate with any urinary retention or saddle anesthesia.  She notes suboptimal pain control in spite of use of her outpatient scheduled and prn opioid pain regimen at home, which consists of scheduled OxyContin twice daily as well as prn oxycodone IR. ? ?Denies any associated chest pain, shortness of breath, palpitations, diaphoresis, dizziness, presyncope, or syncope.  She reports some mild nausea over the last day, in the absence of any vomiting.  Denies any overt abdominal discomfort nor any recent diarrhea, melena, or hematochezia. ? ?Recent subjective fever, chills, rigors, or generalized myalgias.  No recent headache, neck stiffness, cough, rash.  ? ?Per chart review, peers that the patient's baseline hemoglobin ranges 9-10,  with most recent prior hemoglobin noted to be 10.5 on 07/16/2021. ? ? ? ?ED Course:  ?Vital signs in the ED were notable for the following: Afebrile; heart rate 60-74 blood pressure 130/90 - 146/87; respiratory rate 16-21, oxygen saturation 98 to 100% on room air. ? ?Labs were notable for the following: CMP notable for the following: Creatinine 0.84 compared to most recent prior value of 0.86 on 07/16/2021, total bilirubin 1.3, otherwise liver enzymes found to be within normal limits.  CBC notable for white blood cell count 10,800, hemoglobin 10.0 compared to most recent prior value of 10.5 on 07/16/2021.  Reticulocyte count percentage 5.1 compared to most recent prior value of 4.4% on 07/16/2021. ? ?While in the ED, the following were administered: Dilaudid 2 mg IV x3 doses half-normal saline at 75 cc/h, Benadryl 12.5 mg IV x1, Zofran 4 mg IV x1.  Following these measures, the patient continues to report suboptimal pain control.  ? ?Subsequently, the patient was admitted for further evaluation and management of presenting acute sickle cell pain crisis.  ? ? ? ?Review of Systems: As per HPI otherwise 10 point review of systems negative.  ? ?Past Medical History:  ?Diagnosis Date  ? Acute kidney injury (HCC) 01/18/2021  ? Acute pain of left shoulder   ? Cough 06/27/2020  ? Hypokalemia 06/27/2020  ? Hypoxia   ? Leukocytosis 09/03/2016  ? Opiate abuse, episodic (HCC) 09/25/2017  ? Opioid dependence in remission [facility](HCC)   ? Right leg pain   ? Sickle cell crisis (HCC)   ? ? ?Past Surgical History:  ?Procedure Laterality Date  ? APPENDECTOMY    ? CESAREAN  SECTION    ? OTHER SURGICAL HISTORY    ? c-section  ? ? ?Social History: ? reports that she has been smoking cigars and cigarettes. She has never used smokeless tobacco. She reports current alcohol use. She reports that she does not use drugs. ? ? ?Allergies  ?Allergen Reactions  ? Ketamine Anxiety  ?  Anxiety, elevated HR  ? ? ?Family History  ?Problem Relation Age of Onset  ?  Stroke Neg Hx   ?     none that she knows of   ? Seizures Neg Hx   ? ? ?Family history reviewed and not pertinent  ? ? ?Prior to Admission medications   ?Medication Sig Start Date End Date Taking? Authorizing Provider  ?folic acid (FOLVITE) 1 MG tablet Take 1 tablet (1 mg total) by mouth daily. 03/30/21 03/30/22  Massie Maroon, FNP  ?gabapentin (NEURONTIN) 400 MG capsule TAKE 1 CAPSULE(400 MG) BY MOUTH THREE TIMES DAILY 05/20/21   Massie Maroon, FNP  ?ibuprofen (ADVIL) 800 MG tablet Take 1 tablet (800 mg total) by mouth every 8 (eight) hours as needed. ?Patient taking differently: Take 800 mg by mouth every 8 (eight) hours as needed for moderate pain. 05/20/21   Massie Maroon, FNP  ?Melatonin 10 MG TABS Take 1 tablet by mouth at bedtime.    [provider]  ?oxyCODONE (OXYCONTIN) 10 mg 12 hr tablet Take 1 tablet (10 mg total) by mouth every 12 (twelve) hours. 07/27/21   Massie Maroon, FNP  ?Oxycodone HCl 10 MG TABS Take 1 tablet (10 mg total) by mouth every 6 (six) hours as needed for up to 15 days (pain). 08/03/21 08/18/21  Massie Maroon, FNP  ? ? ? ?Objective  ? ? ?Physical Exam: ?Vitals:  ? 08/15/21 0116 08/15/21 0200 08/15/21 0230 08/15/21 0245  ?BP: 130/90 (!) 146/87 131/82 105/71  ?Pulse: (!) 110 71 72 63  ?Resp: (!) 21 16 17 16   ?Temp: 98.4 ?F (36.9 ?C)     ?TempSrc: Oral     ?SpO2: 98% 100% 98% 98%  ?Weight:      ?Height:      ? ? ?General: appears to be stated age; alert, oriented ?Skin: warm, dry, no rash ?Head:  AT/[place] ?Mouth:  Oral mucosa membranes appear moist, normal dentition ?Neck: supple; trachea midline ?Heart:  RRR; did not appreciate any M/R/G ?Lungs: CTAB, did not appreciate any wheezes, rales, or rhonchi ?Abdomen: + BS; soft, ND, NT ?Vascular: 2+ pedal pulses b/l; 2+ radial pulses b/l ?Extremities: no peripheral edema, no muscle wasting ?Neuro: strength and sensation intact in upper and lower extremities b/l ? ? ? ?Labs on Admission: I have personally reviewed following  labs and imaging studies ? ?CBC: ?Recent Labs  ?Lab 08/15/21 ?0215  ?WBC 10.8*  ?NEUTROABS 4.1  ?HGB 10.0*  ?HCT 27.4*  ?MCV 87.3  ?PLT 397  ? ?Basic Metabolic Panel: ?Recent Labs  ?Lab 08/15/21 ?0215  ?NA 143  ?K 4.3  ?CL 109  ?CO2 30  ?GLUCOSE 97  ?BUN 12  ?CREATININE 0.84  ?CALCIUM 9.3  ? ?GFR: ?Estimated Creatinine Clearance: 66.5 mL/min (by C-G formula based on SCr of 0.84 mg/dL). ?Liver Function Tests: ?Recent Labs  ?Lab 08/15/21 ?0215  ?AST 29  ?ALT 20  ?ALKPHOS 47  ?BILITOT 1.3*  ?PROT 7.7  ?ALBUMIN 4.3  ? ?No results for input(s): LIPASE, AMYLASE in the last 168 hours. ?No results for input(s): AMMONIA in the last 168 hours. ?Coagulation Profile: ?No results for  input(s): INR, PROTIME in the last 168 hours. ?Cardiac Enzymes: ?No results for input(s): CKTOTAL, CKMB, CKMBINDEX, TROPONINI in the last 168 hours. ?BNP (last 3 results) ?No results for input(s): PROBNP in the last 8760 hours. ?HbA1C: ?No results for input(s): HGBA1C in the last 72 hours. ?CBG: ?No results for input(s): GLUCAP in the last 168 hours. ?Lipid Profile: ?No results for input(s): CHOL, HDL, LDLCALC, TRIG, CHOLHDL, LDLDIRECT in the last 72 hours. ?Thyroid Function Tests: ?No results for input(s): TSH, T4TOTAL, FREET4, T3FREE, THYROIDAB in the last 72 hours. ?Anemia Panel: ?Recent Labs  ?  08/15/21 ?0215  ?RETICCTPCT 5.1*  ? ?Urine analysis: ?   ?Component Value Date/Time  ? COLORURINE YELLOW 05/31/2021 1350  ? APPEARANCEUR TURBID (A) 05/31/2021 1350  ? LABSPEC 1.012 05/31/2021 1350  ? PHURINE 5.0 05/31/2021 1350  ? GLUCOSEU NEGATIVE 05/31/2021 1350  ? HGBUR SMALL (A) 05/31/2021 1350  ? BILIRUBINUR NEGATIVE 05/31/2021 1350  ? BILIRUBINUR neg 04/07/2020 1014  ? KETONESUR NEGATIVE 05/31/2021 1350  ? PROTEINUR 100 (A) 05/31/2021 1350  ? UROBILINOGEN 0.2 04/07/2020 1014  ? UROBILINOGEN 1.0 12/25/2012 1755  ? NITRITE NEGATIVE 05/31/2021 1350  ? LEUKOCYTESUR MODERATE (A) 05/31/2021 1350  ? ? ?Radiological Exams on Admission: ?No results  found. ? ? ? ?Assessment/Plan  ? ? ?Principal Problem: ?  Sickle cell pain crisis (HCC) ? ? ? ? ? ?#) Acute Sickle Cell Pain Crisis: In the setting of a h/o known sickle cell disease with multiple previous episod

## 2021-08-15 NOTE — Assessment & Plan Note (Signed)
? ?#)  Acute Sickle Cell Pain Crisis: In the setting of a h/o known sickle cell disease with multiple previous episodes of acute sickle cell pain crisis requiring hospitalizaiton, the patient presents with 1 to 2 days of low back discomfort as well as left upper extremity pain of distribution, quality, intensity similar to that which she has experienced at times of her previous acute sickle cell pain crises,, with labs reflecting Hgb 10 and increased reticulocyte count percent of 5.1. Pain remains poorly controlled after multiple doses of IV analgesics in the ED today including after 3 doses of IV Dilaudid. In this setting, will admit for further optimization of pain control, and will plan to aggressively treat pain, as further described below, w/ close monitoring for development of respiratory depression.  Of note, presentation has not been associate with any chest pain, and no overt evidence of acute chest syndrome at this time.  While patient not currently experiencing any chest pain, will check EKG, in order to establish baseline. ? ?In terms of precipitating factors leading to this particular acute sickle cell pain crisis, suspect an element of cold weather induced reflex vasospasm given greater than 40 degree F temperature drop over the last 2 days preceding onset of the above symptoms.  Clinically, no overt evidence of underlying factious process, criteria for sepsis not currently met.  Will further evaluate evidence of any underlying infection check urinalysis, chest x-ray. ? ?Following my discussions with the patient regarding pain control options that include prn IV Dilaudid vs Dilaudid PCA, the patient conveys preference for proceeding w/ Dilaudid PCA.  Consequently, and in the setting of opioid resistance, will initiate Dilaudid PCA with specific settings within the following parameters: 0.01 - 0.02 mg/kg with 10 minute lockout interval and 1 hour limit of 0.06 mg/kg, as further quantified below.   ? ? ? ?Plan: Repeat reticulocyte count and CBC in AM.  Monitor on telemetry. Sickle cell pain assessment per protocol. Aggressive opioid analgesia in the form of initiation of Dilaudid PCA, within the parameters outlined above, and with the following initial settings: bolus dose of 0.5 mg with 10 minute lockout interval and 1 hour limit of 4.0 mg. close monitoring for development of respiratory depression. Prn narcan. Monitor continuous pulse oximetry. Prn supplemental O2 in order to maintain O2 sats between 90-92%, with care to not over-oxygenate as this can suppress bone marrow production of rbc's.  Prn K pad to the affected area. Prn IV Toradol. Gentle IVF's via 1/2NS at 75 cc/h with caution to not induce volume overload.  Monitor strict I's and O's.  Close monitoring of renal function, including repeat BMP in the AM. incentive spirometry to decrease risk of development of atelectasis. Prn Benadryl for pruritus.  Check EKG, chest x-ray, urinalysis, urinary drug screen. ? ? ? ? ?

## 2021-08-15 NOTE — ED Provider Notes (Signed)
?[facility]-EMERGENCY DEPT ?Provider Note ? ? ?CSN: [id number] ?Arrival date & time: 08/15/21  0107 ? ?  ?History ? ?Chief Complaint  ?Patient presents with  ? Sickle Cell Pain Crisis  ? ? ?Lindsey [name] is a 51 y.o. 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 Pain located to lower back and left shoulder.  No fever, chest pain, shortness of breath, cough, abdominal pain, diarrhea, dysuria.  She denies any recent falls or injuries.  Denies swelling, redness to extremities.  No numbness or weakness. Using heating pad at home for pain with Ibuprofen. ? ?HPI ? ?  ? ?Home Medications ?Prior to Admission medications   ?Medication Sig Start Date End Date Taking? Authorizing Provider  ?folic acid (FOLVITE) 1 MG tablet Take 1 tablet (1 mg total) by mouth daily. 03/30/21 03/30/22  Massie Maroon, FNP  ?gabapentin (NEURONTIN) 400 MG capsule TAKE 1 CAPSULE(400 MG) BY MOUTH THREE TIMES DAILY 05/20/21   Massie Maroon, FNP  ?ibuprofen (ADVIL) 800 MG tablet Take 1 tablet (800 mg total) by mouth every 8 (eight) hours as needed. ?Patient taking differently: Take 800 mg by mouth every 8 (eight) hours as needed for moderate pain. 05/20/21   Massie Maroon, FNP  ?Melatonin 10 MG TABS Take 1 tablet by mouth at bedtime.    [provider]  ?oxyCODONE (OXYCONTIN) 10 mg 12 hr tablet Take 1 tablet (10 mg total) by mouth every 12 (twelve) hours. 07/27/21   Massie Maroon, FNP  ?Oxycodone HCl 10 MG TABS Take 1 tablet (10 mg total) by mouth every 6 (six) hours as needed for up to 15 days (pain). 08/03/21 08/18/21  Massie Maroon, FNP  ?   ? ?Allergies    ?Ketamine   ? ?Review of Systems   ?Review of Systems  ?Constitutional: Negative.   ?HENT: Negative.    ?Respiratory: Negative.    ?Cardiovascular: Negative.   ?Gastrointestinal: Negative.   ?Genitourinary: Negative.    ?Musculoskeletal:   ?     Low back pain, left shoulder pain  ?Skin: Negative.   ?Neurological: Negative.   ?All other systems reviewed and are negative. ? ?Physical Exam ?Updated Vital Signs ?BP 105/71   Pulse 63   Temp 98.4 ?F (36.9 ?C) (Oral)   Resp 16   Ht 5' (1.524 m)   Wt 61.7 kg   SpO2 98%   BMI 26.56 kg/m?  ?Physical Exam ?Vitals and nursing note reviewed.  ?Constitutional:   ?   General: She is not in acute distress. ?   Appearance: She is well-developed. She is not ill-appearing, toxic-appearing or diaphoretic.  ?HENT:  ?   Head: Normocephalic and atraumatic.  ?   Nose: Nose normal.  ?   Mouth/Throat:  ?   Mouth: Mucous membranes are moist.  ?Eyes:  ?   Pupils: Pupils are equal, round, and reactive to light.  ?Cardiovascular:  ?   Rate and Rhythm: Tachycardia present.  ?   Pulses: Normal pulses.     ?     Radial pulses are 2+ on the right side and 2+ on the left side.  ?     Dorsalis pedis pulses are 2+ on the right side and 2+ on the left side.  ?   Heart sounds: Normal heart sounds.  ?Pulmonary:  ?   Effort: Pulmonary effort is normal. No  respiratory distress.  ?   Breath sounds: Normal breath sounds.  ?   Comments: Clear bil, speaks in full sentences without difficulty ?Abdominal:  ?   General: Bowel sounds are normal. There is no distension.  ?   Palpations: Abdomen is soft.  ?   Tenderness: There is no abdominal tenderness. There is no right CVA tenderness, left CVA tenderness, guarding or rebound.  ?Musculoskeletal:     ?   General: Normal range of motion.  ?   Cervical back: Normal range of motion.  ?   Comments: No midline tenderness, Comparments soft. Tenderness to left shoulder, pain with ROM however no overlying skin changes. No edema. BLE with Full ROM without difficulty  ?Skin: ?   General: Skin is warm and dry.  ?   Capillary Refill: Capillary refill takes less than 2 seconds.  ?   Comments: No erythema, warmth, edema, induration  ?Neurological:  ?   General: No focal deficit present.   ?   Mental Status: She is alert and oriented to person, place, and time.  ?   Comments: CN 2-12 grossly intact ?Ambulatory   ?Psychiatric:     ?   Mood and Affect: Mood normal.  ? ? ?ED Results / Procedures / Treatments   ?Labs ?(all labs ordered are listed, but only abnormal results are displayed) ?Labs Reviewed  ?COMPREHENSIVE METABOLIC PANEL - Abnormal; Notable for the following components:  ?    Result Value  ? Total Bilirubin 1.3 (*)   ? Anion gap 4 (*)   ? All other components within normal limits  ?CBC WITH DIFFERENTIAL/PLATELET - Abnormal; Notable for the following components:  ? WBC 10.8 (*)   ? RBC 3.14 (*)   ? Hemoglobin 10.0 (*)   ? HCT 27.4 (*)   ? MCHC 36.5 (*)   ? nRBC 0.5 (*)   ? Lymphs Abs 5.3 (*)   ? Eosinophils Absolute 0.7 (*)   ? All other components within normal limits  ?RETICULOCYTES - Abnormal; Notable for the following components:  ? Retic Ct Pct 5.1 (*)   ? RBC. 3.09 (*)   ? Immature Retic Fract 30.8 (*)   ? All other components within normal limits  ?I-STAT BETA HCG BLOOD, ED ([facility], [facility], [facility] ONLY)  ? ? ?EKG ?None ? ?Radiology ?No results found. ? ?Procedures ?[name].Critical Care ?Performed by: Linwood DibblesHenderly, Markeith Jue A, PA-C ?Authorized by: Linwood DibblesHenderly, Sophy Mesler A, PA-C  ? ?Critical care provider statement:  ?  Critical care time (minutes):  35 ?  Critical care was necessary to treat or prevent imminent or life-threatening deterioration of the following conditions:  Metabolic crisis ?  Critical care was time spent personally by me on the following activities:  Development of treatment plan with patient or surrogate, discussions with consultants, evaluation of patient's response to treatment, examination of patient, ordering and review of laboratory studies, ordering and review of radiographic studies, ordering and performing treatments and interventions, pulse oximetry, re-evaluation of patient's condition and review of old charts  ? ? ?Medications Ordered in ED ?Medications  ?0.45 % sodium chloride infusion  ( Intravenous New Bag/Given 08/15/21 0159)  ?ondansetron (ZOFRAN) injection 4 mg (4 mg Intravenous Given 08/15/21 0157)  ?HYDROmorphone (DILAUDID) injection 2 mg (2 mg Intravenous Given 08/15/21 0158)  ?HYDROmorphone (DILAUDID) injection 2 mg (2 mg Intravenous Given 08/15/21 0228)  ?HYDROmorphone (DILAUDID) injection 2 mg (2 mg Intravenous Given 08/15/21 0257)  ?diphenhydrAMINE (BENADRYL) 12.5 mg in sodium chloride 0.9 % 50 mL IVPB (  12.5 mg Intravenous New Bag/Given 08/15/21 0201)  ? ? ?ED Course/ Medical Decision Making/ A&P ?  ? ?51 year old here for evaluation of lower back pain and left shoulder pain which she feels is consistent with her prior sickle cell crises.  She denies any recent traumatic injury.  No bowel or bladder incontinence, saddle paresthesia, denies chance of pregnancy.  She denies any abdominal pain.  No overlying skin changes to suggest sacral/lower back infection.  She does have some tenderness to her left shoulder however denies chest pain, shortness of breath, fever, cough.  Low suspicion for acute chest syndrome.  Her compartments are soft.  She is no clinical evidence of VTE.  I have low suspicion for ischemia, septic joint, hemarthrosis, VTE, fracture, dislocation, acute neurosurgical emergency, osteomylitis, AAA, dissection, PE.  Plan on labs, pain control and reassess ? ?Labs personally viewed and interpreted: ? ?CBC leukocytosis at 10.8, Hgb 10.0 similar to prior ?BMP similar to prior ?Preg neg ?Retics mild elevation ? ?Patient reassessed. Pain uncontrolled after 3 doses of IV pain medicine.  Will admit for further management ? ?CONSULT with Dr. Arlean Hopping with [facility] who agrees to evaluate patient for admission ? ?The patient appears reasonably stabilized for admission considering the current resources, flow, and capabilities available in the ED at this time, and I doubt any other [facility] requiring further screening and/or treatment in the ED prior to admission.  ? ?                        ?Medical  Decision Making ?Amount and/or Complexity of Data Reviewed ?Independent Historian: friend ?External Data Reviewed: labs, radiology and notes. ?Labs: ordered. Decision-making details documented in ED Course. ? ?Risk ?OTC drugs.

## 2021-08-15 NOTE — ED Triage Notes (Signed)
Patient having sickle cell pain crisis that started yesterday. Pain in back and left shoulder. Took 10mg  oxycodone at 9pm.  ?

## 2021-08-16 ENCOUNTER — Other Ambulatory Visit: Payer: Self-pay | Admitting: Internal Medicine

## 2021-08-16 DIAGNOSIS — F17290 Nicotine dependence, other tobacco product, uncomplicated: Secondary | ICD-10-CM | POA: Diagnosis present

## 2021-08-16 DIAGNOSIS — Z79899 Other long term (current) drug therapy: Secondary | ICD-10-CM | POA: Diagnosis not present

## 2021-08-16 DIAGNOSIS — D5700 Hb-SS disease with crisis, unspecified: Secondary | ICD-10-CM | POA: Diagnosis present

## 2021-08-16 DIAGNOSIS — F1121 Opioid dependence, in remission: Secondary | ICD-10-CM | POA: Diagnosis present

## 2021-08-16 LAB — CBC WITH DIFFERENTIAL/PLATELET
Abs Immature Granulocytes: 0.02 10*3/uL (ref 0.00–0.07)
Basophils Absolute: 0 10*3/uL (ref 0.0–0.1)
Basophils Relative: 1 %
Eosinophils Absolute: 0.9 10*3/uL — ABNORMAL HIGH (ref 0.0–0.5)
Eosinophils Relative: 10 %
HCT: 23.9 % — ABNORMAL LOW (ref 36.0–46.0)
Hemoglobin: 8.9 g/dL — ABNORMAL LOW (ref 12.0–15.0)
Immature Granulocytes: 0 %
Lymphocytes Relative: 40 %
Lymphs Abs: 3.5 10*3/uL (ref 0.7–4.0)
MCH: 32.2 pg (ref 26.0–34.0)
MCHC: 37.2 g/dL — ABNORMAL HIGH (ref 30.0–36.0)
MCV: 86.6 fL (ref 80.0–100.0)
Monocytes Absolute: 0.7 10*3/uL (ref 0.1–1.0)
Monocytes Relative: 8 %
Neutro Abs: 3.6 10*3/uL (ref 1.7–7.7)
Neutrophils Relative %: 41 %
Platelets: 362 10*3/uL (ref 150–400)
RBC: 2.76 MIL/uL — ABNORMAL LOW (ref 3.87–5.11)
RDW: 14.1 % (ref 11.5–15.5)
WBC: 8.7 10*3/uL (ref 4.0–10.5)
nRBC: 0.6 % — ABNORMAL HIGH (ref 0.0–0.2)

## 2021-08-16 NOTE — Discharge Summary (Signed)
DISCHARGE SUMMARY ? ?Carmellia Nelida Meuse ? ?MR#: [id number] ? ?DOB:1970/11/22  ?Date of Admission: 08/15/2021 ?Date of Discharge: 08/16/2021 ? ?Attending Physician:Glenyce Randle,LAWAL ? ?Patient's ZE:[id number], Asencion Partridge, FNP ? ?Consults:  None ? ?Discharge Diagnoses: ?Present on Admission: ? Sickle cell pain crisis ([place]) ? ? ? ? ?Allergies as of 08/16/2021   ? ?   Reactions  ? Ketamine Anxiety  ? Anxiety, elevated HR  ? ?  ? ?  ?Medication List  ?  ? ?TAKE these medications   ? ?folic acid 1 MG tablet ?Commonly known as: FOLVITE ?Take 1 tablet (1 mg total) by mouth daily. ?  ?gabapentin 400 MG capsule ?Commonly known as: NEURONTIN ?TAKE 1 CAPSULE(400 MG) BY MOUTH THREE TIMES DAILY ?  ?ibuprofen 800 MG tablet ?Commonly known as: ADVIL ?Take 1 tablet (800 mg total) by mouth every 8 (eight) hours as needed. ?What changed: reasons to take this ?  ?Melatonin 10 MG Tabs ?Take 1 tablet by mouth at bedtime. ?  ?oxyCODONE 10 mg 12 hr tablet ?Commonly known as: OXYCONTIN ?Take 1 tablet (10 mg total) by mouth every 12 (twelve) hours. ?  ?Oxycodone HCl 10 MG Tabs ?Take 1 tablet (10 mg total) by mouth every 6 (six) hours as needed for up to 15 days (pain). ?  ? ?  ? ? ? ? ?Hospital Course: ?Present on Admission: ? Sickle cell pain crisis ([place]) ?:Patient with sickle cell disease who presented with acute sickle cell crisis with pain 10 out of 10 not relieved by home regimen.  Patient was seen and evaluated was placed on IV Dilaudid PCA.  With Toradol and IV fluids.  Patient also placed on her home regimen of pain medications.  She responded to the treatment very well.  Pain drop for more than 9 out of 10 to 4 out of 10 today.  Patient feels better and wants to be able to control her pain at home.  She was discharged home on home regimen.  She is also to follow-up with the PCP.  No fever or chills. ? ? ? ?Day of Discharge ?BP 112/66 (BP Location: Right Arm)   Pulse 60   Temp 98.4 ?F (36.9 ?C) (Oral)   Resp 14   Ht 5' (1.524 m)   Wt 63.9 kg    SpO2 97%   BMI 27.51 kg/m?  ? ?Physical Exam: ?General: Stable no acute distress ?HEENT: PERRLA EOMI no pallor no jaundice ?Respiratory: Good air entry bilaterally no wheeze no rales no crackles ?Cardiovascular: Good air entry bilateral no wheeze no rales no crackles ? ?Results for orders placed or performed during the hospital encounter of 08/15/21 (from the past 24 hour(s))  ?CBC with Differential/Platelet     Status: Abnormal  ? Collection Time: 08/16/21  8:26 AM  ?Result Value Ref Range  ? WBC 8.7 4.0 - 10.5 K/uL  ? RBC 2.76 (L) 3.87 - 5.11 MIL/uL  ? Hemoglobin 8.9 (L) 12.0 - 15.0 g/dL  ? HCT 23.9 (L) 36.0 - 46.0 %  ? MCV 86.6 80.0 - 100.0 fL  ? MCH 32.2 26.0 - 34.0 pg  ? MCHC 37.2 (H) 30.0 - 36.0 g/dL  ? RDW 14.1 11.5 - 15.5 %  ? Platelets 362 150 - 400 K/uL  ? nRBC 0.6 (H) 0.0 - 0.2 %  ? Neutrophils Relative % 41 %  ? Neutro Abs 3.6 1.7 - 7.7 K/uL  ? Lymphocytes Relative 40 %  ? Lymphs Abs 3.5 0.7 - 4.0 K/uL  ? Monocytes Relative  8 %  ? Monocytes Absolute 0.7 0.1 - 1.0 K/uL  ? Eosinophils Relative 10 %  ? Eosinophils Absolute 0.9 (H) 0.0 - 0.5 K/uL  ? Basophils Relative 1 %  ? Basophils Absolute 0.0 0.0 - 0.1 K/uL  ? Immature Granulocytes 0 %  ? Abs Immature Granulocytes 0.02 0.00 - 0.07 K/uL  ? ? ?Disposition: Home ? ? ?Follow-up Appts: ?Discharge Instructions   ? ? Diet - low sodium heart healthy   Complete by: As directed ?  ? Increase activity slowly   Complete by: As directed ?  ? ?  ? ? ?Follow-up with Dr. PCP, ? ? ?Tests Needing Follow-up: ?None ? ?Signed: ?Jazman Reuter,LAWAL ?08/16/2021, 12:55 PM   ?

## 2021-08-17 ENCOUNTER — Other Ambulatory Visit: Payer: Self-pay | Admitting: Internal Medicine

## 2021-08-17 ENCOUNTER — Telehealth: Payer: Self-pay

## 2021-08-17 DIAGNOSIS — G894 Chronic pain syndrome: Secondary | ICD-10-CM

## 2021-08-17 MED ORDER — OXYCODONE HCL 10 MG PO TABS: 10.0000 mg | ORAL_TABLET | Freq: Four times a day (QID) | ORAL | 0 refills | Status: DC | PRN

## 2021-08-17 NOTE — Telephone Encounter (Signed)
Oxycodone  °

## 2021-08-24 ENCOUNTER — Other Ambulatory Visit: Payer: Self-pay | Admitting: Family Medicine

## 2021-08-24 ENCOUNTER — Ambulatory Visit: Payer: Medicaid Other | Admitting: Family Medicine

## 2021-08-24 DIAGNOSIS — G894 Chronic pain syndrome: Secondary | ICD-10-CM

## 2021-08-24 MED ORDER — OXYCODONE HCL 10 MG PO TABS: 10.0000 mg | ORAL_TABLET | Freq: Four times a day (QID) | ORAL | 0 refills | Status: DC | PRN

## 2021-08-24 MED ORDER — OXYCODONE HCL ER 10 MG PO T12A: 10.0000 mg | EXTENDED_RELEASE_TABLET | Freq: Two times a day (BID) | ORAL | 0 refills | Status: DC

## 2021-08-24 NOTE — Progress Notes (Signed)
Reviewed PDMP substance reporting system prior to prescribing opiate medications. No inconsistencies noted.  ?Meds ordered this encounter  ?Medications  ? Oxycodone HCl 10 MG TABS  ?  Sig: Take 1 tablet (10 mg total) by mouth every 6 (six) hours as needed for up to 15 days (pain).  ?  Dispense:  60 tablet  ?  Refill:  0  ?  Order Specific Question:   Supervising Provider  ?  AnswerQuentin Angst [[id number]]  ? oxyCODONE (OXYCONTIN) 10 mg 12 hr tablet  ?  Sig: Take 1 tablet (10 mg total) by mouth every 12 (twelve) hours.  ?  Dispense:  60 tablet  ?  Refill:  0  ?  Order Specific Question:   Supervising Provider  ?  AnswerQuentin Angst [[id number]]  ?  ? ?[name]  APRN, MSN, FNP-C ?Patient Care Center ?[facility] ?[address]  ?[place], [place] [address] ?[phone] ? ?

## 2021-08-27 ENCOUNTER — Other Ambulatory Visit: Payer: Self-pay | Admitting: Family Medicine

## 2021-08-27 ENCOUNTER — Telehealth: Payer: Self-pay

## 2021-08-27 DIAGNOSIS — G894 Chronic pain syndrome: Secondary | ICD-10-CM

## 2021-08-27 MED ORDER — OXYCODONE HCL 10 MG PO TABS: 10.0000 mg | ORAL_TABLET | Freq: Four times a day (QID) | ORAL | 0 refills | Status: DC | PRN

## 2021-08-27 MED ORDER — OXYCODONE HCL ER 10 MG PO T12A: 10.0000 mg | EXTENDED_RELEASE_TABLET | Freq: Two times a day (BID) | ORAL | 0 refills | Status: DC

## 2021-08-27 NOTE — Telephone Encounter (Signed)
Pt called in and stated that the wrong med was sent in  ? ?She needs the Oxycodone Control relief ? ?

## 2021-08-27 NOTE — Progress Notes (Signed)
Reviewed PDMP substance reporting system prior to prescribing opiate medications. No inconsistencies noted.  Meds ordered this encounter  Medications   oxyCODONE (OXYCONTIN) 10 mg 12 hr tablet    Sig: Take 1 tablet (10 mg total) by mouth every 12 (twelve) hours.    Dispense:  60 tablet    Refill:  0    Order Specific Question:   Supervising Provider    Answer:   [name], [name]   Oxycodone HCl 10 MG TABS    Sig: Take 1 tablet (10 mg total) by mouth every 6 (six) hours as needed for up to 15 days (pain).    Dispense:  60 tablet    Refill:  0    Order Specific Question:   Supervising Provider    Answer:   [name], [name]   Lester Crickenberger Moore Tanis Burnley  APRN, MSN, FNP-C Patient Care Center [facility] [address]  [place], [place] [address] [phone]  

## 2021-09-12 ENCOUNTER — Other Ambulatory Visit: Payer: Self-pay | Admitting: Family Medicine

## 2021-09-12 DIAGNOSIS — G894 Chronic pain syndrome: Secondary | ICD-10-CM

## 2021-09-12 DIAGNOSIS — D5700 Hb-SS disease with crisis, unspecified: Secondary | ICD-10-CM

## 2021-09-13 ENCOUNTER — Other Ambulatory Visit: Payer: Self-pay | Admitting: Family Medicine

## 2021-09-13 DIAGNOSIS — D5700 Hb-SS disease with crisis, unspecified: Secondary | ICD-10-CM

## 2021-09-13 DIAGNOSIS — G894 Chronic pain syndrome: Secondary | ICD-10-CM

## 2021-09-14 ENCOUNTER — Other Ambulatory Visit: Payer: Self-pay

## 2021-09-14 ENCOUNTER — Telehealth: Payer: Self-pay

## 2021-09-14 NOTE — Telephone Encounter (Signed)
Oxycodone (immediate) ?Gabapentin ?

## 2021-09-15 ENCOUNTER — Other Ambulatory Visit: Payer: Self-pay | Admitting: Family Medicine

## 2021-09-15 DIAGNOSIS — G894 Chronic pain syndrome: Secondary | ICD-10-CM

## 2021-09-15 MED ORDER — OXYCODONE HCL 10 MG PO TABS: 10.0000 mg | ORAL_TABLET | Freq: Four times a day (QID) | ORAL | 0 refills | Status: DC | PRN

## 2021-09-15 NOTE — Progress Notes (Signed)
Reviewed PDMP substance reporting system prior to prescribing opiate medications. No inconsistencies noted.  Meds ordered this encounter  Medications   Oxycodone HCl 10 MG TABS    Sig: Take 1 tablet (10 mg total) by mouth every 6 (six) hours as needed for up to 15 days (pain).    Dispense:  60 tablet    Refill:  0    Order Specific Question:   Supervising Provider    Answer:   [name], [name]   Lindsey Juba Moore Monserath Neff  APRN, MSN, FNP-C Patient Care Center [facility] [address]  [place], [place] [address] [phone]  

## 2021-09-27 ENCOUNTER — Other Ambulatory Visit: Payer: Self-pay | Admitting: Family Medicine

## 2021-09-27 DIAGNOSIS — G894 Chronic pain syndrome: Secondary | ICD-10-CM

## 2021-09-27 MED ORDER — OXYCODONE HCL ER 10 MG PO T12A: 10.0000 mg | EXTENDED_RELEASE_TABLET | Freq: Two times a day (BID) | ORAL | 0 refills | Status: DC

## 2021-09-27 MED ORDER — OXYCODONE HCL 10 MG PO TABS: 10.0000 mg | ORAL_TABLET | Freq: Four times a day (QID) | ORAL | 0 refills | Status: AC | PRN

## 2021-09-27 NOTE — Progress Notes (Signed)
Reviewed PDMP substance reporting system prior to prescribing opiate medications. No inconsistencies noted.  Meds ordered this encounter  Medications   oxyCODONE (OXYCONTIN) 10 mg 12 hr tablet    Sig: Take 1 tablet (10 mg total) by mouth every 12 (twelve) hours.    Dispense:  60 tablet    Refill:  0    Order Specific Question:   Supervising Provider    Answer:   Quentin Angst [[id number]]   Oxycodone HCl 10 MG TABS    Sig: Take 1 tablet (10 mg total) by mouth every 6 (six) hours as needed for up to 15 days (pain).    Dispense:  60 tablet    Refill:  0    Order Specific Question:   Supervising Provider    Answer:   Quentin Angst [[id number]]   [name]  APRN, MSN, FNP-C Patient Care [facility] [address] [place], [place] [address] [phone]

## 2021-10-09 ENCOUNTER — Other Ambulatory Visit: Payer: Self-pay

## 2021-10-09 ENCOUNTER — Emergency Department (HOSPITAL_COMMUNITY): Payer: Medicaid Other

## 2021-10-09 ENCOUNTER — Inpatient Hospital Stay (HOSPITAL_COMMUNITY)
Admission: EM | Admit: 2021-10-09 | Discharge: 2021-10-11 | DRG: 812 | Disposition: A | Discharge status: Home / Self Care | Payer: Medicaid Other | Attending: Internal Medicine | Admitting: Internal Medicine

## 2021-10-09 ENCOUNTER — Encounter (HOSPITAL_COMMUNITY): Payer: Self-pay

## 2021-10-09 DIAGNOSIS — D5700 Hb-SS disease with crisis, unspecified: Principal | ICD-10-CM | POA: Diagnosis present

## 2021-10-09 DIAGNOSIS — F320 Major depressive disorder, single episode, mild: Secondary | ICD-10-CM | POA: Diagnosis present

## 2021-10-09 DIAGNOSIS — D72829 Elevated white blood cell count, unspecified: Secondary | ICD-10-CM | POA: Diagnosis present

## 2021-10-09 DIAGNOSIS — F17210 Nicotine dependence, cigarettes, uncomplicated: Secondary | ICD-10-CM | POA: Diagnosis present

## 2021-10-09 DIAGNOSIS — M19011 Primary osteoarthritis, right shoulder: Secondary | ICD-10-CM | POA: Diagnosis present

## 2021-10-09 DIAGNOSIS — M79674 Pain in right toe(s): Secondary | ICD-10-CM | POA: Diagnosis present

## 2021-10-09 DIAGNOSIS — M79604 Pain in right leg: Secondary | ICD-10-CM

## 2021-10-09 DIAGNOSIS — G894 Chronic pain syndrome: Secondary | ICD-10-CM | POA: Diagnosis present

## 2021-10-09 DIAGNOSIS — M1909 Primary osteoarthritis, other specified site: Secondary | ICD-10-CM | POA: Diagnosis present

## 2021-10-09 DIAGNOSIS — D75838 Other thrombocytosis: Secondary | ICD-10-CM | POA: Diagnosis present

## 2021-10-09 DIAGNOSIS — F1190 Opioid use, unspecified, uncomplicated: Secondary | ICD-10-CM | POA: Diagnosis present

## 2021-10-09 DIAGNOSIS — F17290 Nicotine dependence, other tobacco product, uncomplicated: Secondary | ICD-10-CM | POA: Diagnosis present

## 2021-10-09 DIAGNOSIS — F330 Major depressive disorder, recurrent, mild: Secondary | ICD-10-CM | POA: Diagnosis present

## 2021-10-09 DIAGNOSIS — Z79891 Long term (current) use of opiate analgesic: Secondary | ICD-10-CM

## 2021-10-09 DIAGNOSIS — D649 Anemia, unspecified: Secondary | ICD-10-CM

## 2021-10-09 LAB — CBC WITH DIFFERENTIAL/PLATELET
Abs Immature Granulocytes: 0.02 10*3/uL (ref 0.00–0.07)
Basophils Absolute: 0.1 10*3/uL (ref 0.0–0.1)
Basophils Relative: 1 %
Eosinophils Absolute: 0.4 10*3/uL (ref 0.0–0.5)
Eosinophils Relative: 5 %
HCT: 28.8 % — ABNORMAL LOW (ref 36.0–46.0)
Hemoglobin: 10.8 g/dL — ABNORMAL LOW (ref 12.0–15.0)
Immature Granulocytes: 0 %
Lymphocytes Relative: 39 %
Lymphs Abs: 3.1 10*3/uL (ref 0.7–4.0)
MCH: 31.8 pg (ref 26.0–34.0)
MCHC: 37.5 g/dL — ABNORMAL HIGH (ref 30.0–36.0)
MCV: 84.7 fL (ref 80.0–100.0)
Monocytes Absolute: 0.6 10*3/uL (ref 0.1–1.0)
Monocytes Relative: 7 %
Neutro Abs: 3.7 10*3/uL (ref 1.7–7.7)
Neutrophils Relative %: 48 %
Platelets: 487 10*3/uL — ABNORMAL HIGH (ref 150–400)
RBC: 3.4 MIL/uL — ABNORMAL LOW (ref 3.87–5.11)
RDW: 13.9 % (ref 11.5–15.5)
WBC: 7.8 10*3/uL (ref 4.0–10.5)
nRBC: 0.8 % — ABNORMAL HIGH (ref 0.0–0.2)

## 2021-10-09 LAB — COMPREHENSIVE METABOLIC PANEL
ALT: 16 U/L (ref 0–44)
AST: 21 U/L (ref 15–41)
Albumin: 4.1 g/dL (ref 3.5–5.0)
Alkaline Phosphatase: 45 U/L (ref 38–126)
Anion gap: 6 (ref 5–15)
BUN: 11 mg/dL (ref 6–20)
CO2: 26 mmol/L (ref 22–32)
Calcium: 9.2 mg/dL (ref 8.9–10.3)
Chloride: 108 mmol/L (ref 98–111)
Creatinine, Ser: 0.91 mg/dL (ref 0.44–1.00)
GFR, Estimated: >60 mL/min (ref >60)
Glucose, Bld: 95 mg/dL (ref 70–99)
Potassium: 4.1 mmol/L (ref 3.5–5.1)
Sodium: 140 mmol/L (ref 135–145)
Total Bilirubin: 1.2 mg/dL (ref 0.3–1.2)
Total Protein: 7.8 g/dL (ref 6.5–8.1)

## 2021-10-09 LAB — TYPE AND SCREEN
ABO/RH(D): A POS
Antibody Screen: NEGATIVE

## 2021-10-09 LAB — RETICULOCYTES
Immature Retic Fract: 33.3 % — ABNORMAL HIGH (ref 2.3–15.9)
RBC.: 3.38 MIL/uL — ABNORMAL LOW (ref 3.87–5.11)
Retic Count, Absolute: 148 10*3/uL (ref 19.0–186.0)
Retic Ct Pct: 4.4 % — ABNORMAL HIGH (ref 0.4–3.1)

## 2021-10-09 MED ORDER — DIPHENHYDRAMINE HCL 50 MG/ML IJ SOLN
12.5000 mg | Freq: Once | INTRAMUSCULAR | Status: AC
Start: 2021-10-09 — End: 2021-10-09
  Administered 2021-10-09: 12.5 mg via INTRAVENOUS
  Filled 2021-10-09: qty 1

## 2021-10-09 MED ORDER — KETOROLAC TROMETHAMINE 15 MG/ML IJ SOLN
15.0000 mg | Freq: Four times a day (QID) | INTRAMUSCULAR | Status: DC
  Administered 2021-10-09 – 2021-10-11 (×8): 15 mg via INTRAVENOUS
  Filled 2021-10-09 (×8): qty 1

## 2021-10-09 MED ORDER — HYDROMORPHONE HCL 2 MG/ML IJ SOLN
2.0000 mg | INTRAMUSCULAR | Status: AC
  Administered 2021-10-09 (×3): 2 mg via INTRAVENOUS
  Filled 2021-10-09 (×3): qty 1

## 2021-10-09 MED ORDER — HYDROMORPHONE HCL 2 MG/ML IJ SOLN
2.0000 mg | INTRAMUSCULAR | Status: DC | PRN
  Administered 2021-10-09 (×2): 2 mg via INTRAVENOUS
  Filled 2021-10-09 (×2): qty 1

## 2021-10-09 MED ORDER — DIPHENHYDRAMINE HCL 25 MG PO CAPS: 25.0000 mg | ORAL_CAPSULE | ORAL | Status: DC | PRN

## 2021-10-09 MED ORDER — ONDANSETRON HCL 4 MG/2ML IJ SOLN
4.0000 mg | Freq: Four times a day (QID) | INTRAMUSCULAR | Status: DC | PRN
Start: 2021-10-09 — End: 2021-10-11

## 2021-10-09 MED ORDER — POLYETHYLENE GLYCOL 3350 17 G PO PACK: 17.0000 g | PACK | Freq: Every day | ORAL | Status: DC | PRN

## 2021-10-09 MED ORDER — OXYCODONE HCL 5 MG PO TABS
10.0000 mg | ORAL_TABLET | Freq: Once | ORAL | Status: AC
  Administered 2021-10-09: 10 mg via ORAL
  Filled 2021-10-09: qty 2

## 2021-10-09 MED ORDER — ENOXAPARIN SODIUM 40 MG/0.4ML IJ SOSY
40.0000 mg | PREFILLED_SYRINGE | INTRAMUSCULAR | Status: DC
  Administered 2021-10-09 – 2021-10-10 (×2): 40 mg via SUBCUTANEOUS
  Filled 2021-10-09 (×2): qty 0.4

## 2021-10-09 MED ORDER — SENNOSIDES-DOCUSATE SODIUM 8.6-50 MG PO TABS
1.0000 | ORAL_TABLET | Freq: Two times a day (BID) | ORAL | Status: DC
  Administered 2021-10-09 – 2021-10-11 (×4): 1 via ORAL
  Filled 2021-10-09 (×4): qty 1

## 2021-10-09 MED ORDER — SODIUM CHLORIDE 0.9% FLUSH: 9.0000 mL | INTRAVENOUS | Status: DC | PRN

## 2021-10-09 MED ORDER — DEXTROSE-NACL 5-0.45 % IV SOLN: INTRAVENOUS | Status: DC

## 2021-10-09 MED ORDER — HYDROMORPHONE 1 MG/ML IV SOLN
INTRAVENOUS | Status: DC
  Administered 2021-10-09: 3.5 mg via INTRAVENOUS
  Administered 2021-10-09: 6 mg via INTRAVENOUS
  Administered 2021-10-10 (×2): 2.5 mg via INTRAVENOUS
  Administered 2021-10-10: 3 mg via INTRAVENOUS
  Administered 2021-10-11: 2 mg via INTRAVENOUS
  Administered 2021-10-11: 30 mg via INTRAVENOUS
  Administered 2021-10-11: 2 mg via INTRAVENOUS
  Administered 2021-10-11: 1 mg via INTRAVENOUS
  Filled 2021-10-09 (×2): qty 30

## 2021-10-09 MED ORDER — ONDANSETRON HCL 4 MG/2ML IJ SOLN
4.0000 mg | Freq: Once | INTRAMUSCULAR | Status: AC
  Administered 2021-10-09: 4 mg via INTRAVENOUS
  Filled 2021-10-09: qty 2

## 2021-10-09 MED ORDER — SODIUM CHLORIDE 0.45 % IV SOLN: INTRAVENOUS | Status: DC

## 2021-10-09 MED ORDER — NALOXONE HCL 0.4 MG/ML IJ SOLN: 0.4000 mg | INTRAMUSCULAR | Status: DC | PRN

## 2021-10-09 NOTE — ED Triage Notes (Signed)
Pt presents with c/o sickle cell pain for approx one week. Pt reports the pain is in her right leg, no relief with home medications.

## 2021-10-09 NOTE — Plan of Care (Signed)
  Problem: Self-Care: Goal: Ability to incorporate actions that prevent/reduce pain crisis will improve 10/09/2021 2342 by Abagail Kitchens, RN Outcome: Progressing 10/09/2021 2341 by Abagail Kitchens, RN Outcome: Progressing   Problem: Respiratory: Goal: Pulmonary complications will be avoided or minimized 10/09/2021 2342 by Abagail Kitchens, RN Outcome: Progressing 10/09/2021 2341 by Abagail Kitchens, RN Outcome: Progressing   Problem: Education: Goal: Knowledge of General Education information will improve Description: Including pain rating scale, medication(s)/side effects and non-pharmacologic comfort measures Outcome: Progressing   Problem: Clinical Measurements: Goal: Ability to maintain clinical measurements within normal limits will improve Outcome: Progressing   Problem: Pain Managment: Goal: General experience of comfort will improve Outcome: Progressing   Problem: Safety: Goal: Ability to remain free from injury will improve Outcome: Progressing

## 2021-10-09 NOTE — H&P (Signed)
History and Physical    Patient: Lindsey [name] HOZ:[id number] DOB: 27-Jul-1970 DOA: 10/09/2021 DOS: the patient was seen and examined on 10/09/2021 PCP: Massie Maroon, FNP  Patient coming from: Home  Chief Complaint:  Chief Complaint  Patient presents with   Sickle Cell Pain Crisis   HPI: Lindsey [name] is a 51 y.o. 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  Pain was rated as 10 out of 10.  Persistent.  Patient has taken her home regimen but no relief.  She has been dealing with it for at least 2 days and seeing that no relief is coming patient decided to come to the ER for further evaluation and treatment.  Denied any fever or chills.  In the ER she has received up to 6 mg of IV Dilaudid with no resolution so patient is being admitted for further evaluation and treatment.  Review of Systems: As mentioned in the history of present illness. All other systems reviewed and are negative. Past Medical History:  Diagnosis Date   Acute kidney injury (HCC) 01/18/2021   Acute pain of left shoulder    Cough 06/27/2020   Hypokalemia 06/27/2020   Hypoxia    Leukocytosis 09/03/2016   Opiate abuse, episodic (HCC) 09/25/2017   Opioid dependence in remission (HCC)    Right leg pain    Sickle cell crisis [facility])    Past Surgical History:  Procedure Laterality Date   APPENDECTOMY     CESAREAN SECTION     OTHER SURGICAL HISTORY     c-section   Social History:  reports that she has been smoking cigars and cigarettes. She has never used smokeless tobacco. She reports current alcohol use. She reports that she does not use drugs.  Allergies  Allergen Reactions   Ketamine Anxiety    Tachycardia    Family History  Problem Relation Age of Onset   Stroke Neg Hx        none that she knows of     Seizures Neg Hx     Prior to Admission medications   Medication Sig Start Date End Date Taking? Authorizing Provider  diphenhydrAMINE (BENADRYL ALLERGY) 25 MG tablet Take 50 mg by mouth at bedtime as needed for sleep.   Yes [provider]  gabapentin (NEURONTIN) 400 MG capsule TAKE 1 CAPSULE(400 MG) BY MOUTH THREE TIMES DAILY Patient taking differently: Take 400 mg by mouth 3 (three) times daily. 09/15/21  Yes Massie Maroon, FNP  oxyCODONE (OXYCONTIN) 10 mg 12 hr tablet Take 1 tablet (10 mg total) by mouth every 12 (twelve) hours. 09/30/21  Yes Massie Maroon, FNP  Oxycodone HCl 10 MG TABS Take 1 tablet (10 mg total) by mouth every 6 (six) hours as needed for up to 15 days (pain). 09/30/21 10/15/21 Yes Massie Maroon, FNP  folic acid (FOLVITE) 1 MG tablet Take 1 tablet (1 mg total) by mouth daily. Patient not taking: Reported on 10/09/2021 03/30/21 03/30/22  Massie Maroon, FNP  ibuprofen (ADVIL) 800 MG tablet Take 1 tablet (800 mg total) by mouth every 8 (eight) hours as needed. Patient not taking: Reported on 10/09/2021 05/20/21   Massie Maroon, FNP  Melatonin 10 MG TABS Take 1 tablet by mouth at bedtime. Patient not taking: Reported on 10/09/2021    [provider]  Physical Exam: Vitals:   10/09/21 1145 10/09/21 1217 10/09/21 1230 10/09/21 1245  BP: 98/70 (!) 98/58 98/66 102/65  Pulse: 71 78 62 67  Resp: 13 (!) 22 14 14   Temp:      TempSrc:      SpO2: 96% 95% 94% 94%   General: Stable, in mild distress due to pain HEENT: PERRL, EOMI, no pallor no jaundice Neck: Supple, no JVD no lymphadenopathy Respiratory: Good air entry bilaterally no wheeze rales or crackles Cardiovascular: Regular rate and rhythm Abdomen: Soft, nontender, positive bowel sounds Extremities: No edema cyanosis or clubbing Neuro exam: Nonfocal  Data Reviewed:  Results are pending, will review when available.  Assessment and Plan:  #1 sickle cell pain crisis: Patient will be  admitted and initiated on Dilaudid PCA, Toradol, and other IV fluids.  She is overall not comfortable with current ER dosing.  Will be on D5 half-normal.  Oral agents will also be added.  #2 anemia of chronic disease: Her hemoglobin seems to be at baseline.  Continue monitoring  #3 chronic pain syndrome: We will resume chronic home medications at time of discharge.  #4 right leg pain: Has documented chronic right leg pain.  This is worsened by her crisis.     Advance Care Planning:   Code Status: Prior full code  Consults: None  Family Communication: No family at bedside  Severity of Illness: The appropriate patient status for this patient is INPATIENT. Inpatient status is judged to be reasonable and necessary in order to provide the required intensity of service to ensure the patient's safety. The patient's presenting symptoms, physical exam findings, and initial radiographic and laboratory data in the context of their chronic comorbidities is felt to place them at high risk for further clinical deterioration. Furthermore, it is not anticipated that the patient will be medically stable for discharge from the hospital within 2 midnights of admission.   * I certify that at the point of admission it is my clinical judgment that the patient will require inpatient hospital care spanning beyond 2 midnights from the point of admission due to high intensity of service, high risk for further deterioration and high frequency of surveillance required.*  Author , MD 10/09/2021 1:00 PM  For on call review [email or web address].

## 2021-10-09 NOTE — ED Provider Notes (Signed)
[facility]-EMERGENCY DEPT Provider Note   CSN: [id number] Arrival date & time: 10/09/21  [id number]     History Chief Complaint  Patient presents with   Sickle Cell Pain Crisis    Lindsey [name] is a 51 y.o. female with history of sickle cell disease who presents to the emergency department with a sickle cell pain crisis that started roughly a week ago.  She states her last sickle cell crisis was 1 to 2 months ago.  Patient is complaining of right lower leg pain.  She has been taking her home medications with no relief.  She denies chest pain, shortness of breath, abdominal pain, nausea, vomiting, diarrhea.  She denies any injury or trauma to the leg.   Sickle Cell Pain Crisis     Home Medications Prior to Admission medications   Medication Sig Start Date End Date Taking? Authorizing Provider  diphenhydrAMINE (BENADRYL ALLERGY) 25 MG tablet Take 50 mg by mouth at bedtime as needed for sleep.   Yes [provider]  gabapentin (NEURONTIN) 400 MG capsule TAKE 1 CAPSULE(400 MG) BY MOUTH THREE TIMES DAILY Patient taking differently: Take 400 mg by mouth 3 (three) times daily. 09/15/21  Yes Massie MaroonHollis, Lachina M, FNP  oxyCODONE (OXYCONTIN) 10 mg 12 hr tablet Take 1 tablet (10 mg total) by mouth every 12 (twelve) hours. 09/30/21  Yes Massie MaroonHollis, Lachina M, FNP  Oxycodone HCl 10 MG TABS Take 1 tablet (10 mg total) by mouth every 6 (six) hours as needed for up to 15 days (pain). 09/30/21 10/15/21 Yes Massie MaroonHollis, Lachina M, FNP  folic acid (FOLVITE) 1 MG tablet Take 1 tablet (1 mg total) by mouth daily. Patient not taking: Reported on 10/09/2021 03/30/21 03/30/22  Massie MaroonHollis, Lachina M, FNP  ibuprofen (ADVIL) 800 MG tablet Take 1 tablet (800 mg total) by mouth every 8 (eight) hours as needed. Patient not taking: Reported on 10/09/2021 05/20/21   Massie MaroonHollis, Lachina M, FNP  Melatonin 10 MG TABS Take 1 tablet by mouth at bedtime. Patient not taking: Reported on 10/09/2021    [provider]      Allergies    Ketamine    Review of Systems   Review of Systems  All other systems reviewed and are negative.  Physical Exam Updated Vital Signs BP 102/65   Pulse 67   Temp 98.2 F (36.8 C) (Oral)   Resp 14   LMP 07/09/2021 (Approximate)   SpO2 94%  Physical Exam Vitals and nursing note reviewed.  Constitutional:      General: She is in acute distress.     Appearance: Normal appearance.     Comments: Appears uncomfortable.  Tearful.  HENT:     Head: Normocephalic and atraumatic.  Eyes:     General:        Right eye: No discharge.        Left eye: No discharge.  Cardiovascular:     Rate and Rhythm: Tachycardia present.     Comments: S1/S2 are distinct without any evidence of murmur, rubs, or gallops.  Radial pulses are 2+ bilaterally. No evidence of pedal edema. Pulmonary:     Comments: Clear to auscultation bilaterally.  Normal effort.  No respiratory distress.  No evidence of wheezes, rales, or rhonchi heard throughout. Abdominal:     General: Abdomen is flat. Bowel sounds are normal. There is no distension.     Tenderness: There is no abdominal tenderness. There is no guarding or rebound.  Musculoskeletal:  General: Normal range of motion.     Cervical back: Neck supple.     Comments: Painful range of motion with her right lower extremity.  Compartments are soft.  Skin:    General: Skin is warm and dry.     Findings: No rash.  Neurological:     General: No focal deficit present.     Mental Status: She is alert.  Psychiatric:        Mood and Affect: Mood normal.        Behavior: Behavior normal.    ED Results / Procedures / Treatments   Labs (all labs ordered are listed, but only abnormal results are displayed) Labs Reviewed  CBC WITH DIFFERENTIAL/PLATELET - Abnormal; Notable for the following components:      Result Value   RBC 3.40 (*)    Hemoglobin 10.8 (*)    HCT 28.8 (*)    MCHC 37.5 (*)    Platelets 487 (*)    nRBC 0.8 (*)    All  other components within normal limits  RETICULOCYTES - Abnormal; Notable for the following components:   Retic Ct Pct 4.4 (*)    RBC. 3.38 (*)    Immature Retic Fract 33.3 (*)    All other components within normal limits  COMPREHENSIVE METABOLIC PANEL  TYPE AND SCREEN    EKG None  Radiology DG Tibia/Fibula Right  Result Date: 10/09/2021 CLINICAL DATA:  Right leg pain for 1 week.  Sickle cell disease. EXAM: RIGHT TIBIA AND FIBULA - 2 VIEW COMPARISON:  10/26/2020 FINDINGS: There is no evidence of fracture or other focal bone lesions. Soft tissues are unremarkable. IMPRESSION: Negative. Electronically Signed   By: Danae Orleans M.D.   On: 10/09/2021 10:56    Procedures .Critical Care Performed by: Teressa Lower, PA-C Authorized by: Teressa Lower, PA-C   Critical care provider statement:    Critical care time (minutes):  35   Critical care time was exclusive of:  Separately billable procedures and treating other patients   Critical care was necessary to treat or prevent imminent or life-threatening deterioration of the following conditions:  Metabolic crisis   Critical care was time spent personally by me on the following activities:  Ordering and performing treatments and interventions, ordering and review of laboratory studies, ordering and review of radiographic studies, re-evaluation of patient's condition, review of old charts, blood draw for specimens and development of treatment plan with patient or surrogate    Medications Ordered in ED Medications  0.45 % sodium chloride infusion ( Intravenous Rate/Dose Change 10/09/21 1212)  HYDROmorphone (DILAUDID) injection 2 mg (2 mg Intravenous Given 10/09/21 1212)  diphenhydrAMINE (BENADRYL) injection 12.5 mg (12.5 mg Intravenous Given 10/09/21 1033)  ondansetron (ZOFRAN) injection 4 mg (4 mg Intravenous Given 10/09/21 1033)    ED Course/ Medical Decision Making/ A&P Clinical Course as of 10/09/21 1301  Sat Oct 09, 2021  1242 On  reevaluation, patient still complaining of significant plain.  I have given her her 3 rounds of Dilaudid and Benadryl.  Given that she still having pain in that right lower extremity I think would be benefit to bring her into the hospital for further evaluation and management of her pain. [CF]  1255 I have spoken with Dr. Mikeal Hawthorne with the sickle cell management team who agrees to admit the patient.  [CF]  1256 CBC WITH DIFFERENTIAL(!) No evidence of leukocytosis.  Patient is at her baseline in comparison to previous with regards to her anemia.  There is evidence of thrombocytosis. [CF]  1257 Comprehensive metabolic panel No abnormalities. [CF]  1257 Type and screen [facility] Patient is A positive. [CF]  1257 Reticulocytes(!) No signs of aplastic anemia today. [CF]    Clinical Course User Index [CF] Teressa Lower, PA-C                           Medical Decision Making Lindsey [name] is a 51 y.o. female with history of sickle cell disease who presents to the emergency department today with a sickle cell pain crisis.  No evidence of compartment syndrome on clinical exam.  Patient is tachycardic and tearful and appears quite uncomfortable secondary to pain.  We will get an x-ray to evaluate for underlying bony injury.  Labs are obtained to ensure no evidence of aplastic anemia.  Pain medication was ordered.   Amount and/or Complexity of Data Reviewed External Data Reviewed: labs and notes.    Details: From her recent discharge back in April. Labs: ordered. Decision-making details documented in ED Course.    Details: I personally ordered and interpreted labs Radiology: ordered and independent interpretation performed.    Details: I personally ordered and interpreted x-ray of the right tibia and fibula.  No signs of overt fracture.  Risk Prescription drug management. Parenteral controlled substances. Risk Details: Patient still complaining of significant mount of  pain despite getting multiple rounds of opioids here in the emergency department.  All of her labs and imaging are reassuring for emergent pathology.  However, given her pain level, I do feel that the patient would likely benefit from further evaluation and management in the hospital.  I spoke with Dr. Mikeal Hawthorne with the sickle cell management team who agrees to admit the patient.   Final Clinical Impression(s) / ED Diagnoses Final diagnoses:  Sickle cell pain crisis (HCC)  Anemia, unspecified type  Right leg pain    Rx / DC Orders ED Discharge Orders     None         Teressa Lower, [place] 10/09/21 1301    Bethann Berkshire, MD 10/09/21 [phone]

## 2021-10-10 LAB — CBC WITH DIFFERENTIAL/PLATELET
Abs Immature Granulocytes: 0.07 10*3/uL (ref 0.00–0.07)
Basophils Absolute: 0.1 10*3/uL (ref 0.0–0.1)
Basophils Relative: 0 %
Eosinophils Absolute: 0.4 10*3/uL (ref 0.0–0.5)
Eosinophils Relative: 3 %
HCT: 25.7 % — ABNORMAL LOW (ref 36.0–46.0)
Hemoglobin: 9.4 g/dL — ABNORMAL LOW (ref 12.0–15.0)
Immature Granulocytes: 1 %
Lymphocytes Relative: 20 %
Lymphs Abs: 2.9 10*3/uL (ref 0.7–4.0)
MCH: 31.3 pg (ref 26.0–34.0)
MCHC: 36.6 g/dL — ABNORMAL HIGH (ref 30.0–36.0)
MCV: 85.7 fL (ref 80.0–100.0)
Monocytes Absolute: 0.8 10*3/uL (ref 0.1–1.0)
Monocytes Relative: 5 %
Neutro Abs: 10.4 10*3/uL — ABNORMAL HIGH (ref 1.7–7.7)
Neutrophils Relative %: 71 %
Platelets: 408 10*3/uL — ABNORMAL HIGH (ref 150–400)
RBC: 3 MIL/uL — ABNORMAL LOW (ref 3.87–5.11)
RDW: 14 % (ref 11.5–15.5)
WBC: 14.6 10*3/uL — ABNORMAL HIGH (ref 4.0–10.5)
nRBC: 0.3 % — ABNORMAL HIGH (ref 0.0–0.2)

## 2021-10-10 LAB — COMPREHENSIVE METABOLIC PANEL
ALT: 17 U/L (ref 0–44)
AST: 20 U/L (ref 15–41)
Albumin: 3.5 g/dL (ref 3.5–5.0)
Alkaline Phosphatase: 43 U/L (ref 38–126)
Anion gap: 5 (ref 5–15)
BUN: 14 mg/dL (ref 6–20)
CO2: 26 mmol/L (ref 22–32)
Calcium: 8.7 mg/dL — ABNORMAL LOW (ref 8.9–10.3)
Chloride: 109 mmol/L (ref 98–111)
Creatinine, Ser: 0.71 mg/dL (ref 0.44–1.00)
GFR, Estimated: >60 mL/min (ref >60)
Glucose, Bld: 106 mg/dL — ABNORMAL HIGH (ref 70–99)
Potassium: 4.1 mmol/L (ref 3.5–5.1)
Sodium: 140 mmol/L (ref 135–145)
Total Bilirubin: 1.3 mg/dL — ABNORMAL HIGH (ref 0.3–1.2)
Total Protein: 6.2 g/dL — ABNORMAL LOW (ref 6.5–8.1)

## 2021-10-10 NOTE — Progress Notes (Signed)
SICKLE CELL SERVICE PROGRESS NOTE  Lindsey [name] JSR:[id number] DOB: January 04, 1971 DOA: 10/09/2021 PCP: Massie Maroon, FNP  Assessment/Plan: Active Problems:   Sickle cell pain crisis (HCC)   Chronic narcotic use   Current mild episode of major depressive disorder (HCC)   Sickle cell anemia with crisis (HCC)  Sickle cell pain crisis: Patient still in significant pain.  Continue with current set up of Dilaudid PCA, Toradol, IV fluids. Anemia of chronic disease: Hemoglobin remained stable at 9.4.  Continue to monitor. Leukocytosis: Secondary to vaso-occlusive crisis.  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  Chronic and at baseline.  Code Status: Full code Family Communication: No family at bedside Disposition Plan: Home  [facility]  Pager [phone]. If 7PM-7AM, please contact night-coverage.  10/10/2021, 1:25 PM  LOS: 1 day   Brief narrative: Lindsey [name] is a 51 y.o. 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  Pain was rated as 10 out of 10.  Persistent.  Patient has taken her home regimen but no relief.  She has been dealing with it for at least 2 days and seeing that no relief is coming patient decided to come to the ER for further evaluation and treatment.  Denied any fever or chills.  In the ER she has received up to 6 mg of IV Dilaudid with no resolution so patient is being admitted for further evaluation and treatment.   Consultants: None  Procedures: None  Antibiotics: None  HPI/Subjective: Patient still has pain at 8 out of 10.  No fever no chills.  No nausea vomiting or diarrhea.  Still on the Dilaudid PCA Toradol and IV fluids.  Objective: Vitals:   10/10/21 0521 10/10/21 0800 10/10/21 0846 10/10/21 1207  BP: (!) 98/57  102/62    Pulse: (!) 58  64   Resp: 14 14 17 17   Temp: 98.6 F (37 C)  98.5 F (36.9 C)   TempSrc: Oral  Oral   SpO2: 99% 99% 97% 97%  Weight:      Height:       Weight change:   Intake/Output Summary (Last 24 hours) at 10/10/2021 1325 Last data filed at 10/10/2021 1000 Gross per 24 hour  Intake 3505.7 ml  Output --  Net 3505.7 ml    General: Alert, awake, oriented x3, in no acute distress.  HEENT: [place]/AT PEERL, EOMI Neck: Trachea midline,  no masses, no thyromegal,y no JVD, no carotid bruit OROPHARYNX:  Moist, No exudate/ erythema/lesions.  Heart: Regular rate and rhythm, without murmurs, rubs, gallops, PMI non-displaced, no heaves or thrills on palpation.  Lungs: Clear to auscultation, no wheezing or rhonchi noted. No increased vocal fremitus resonant to percussion  Abdomen: Soft, nontender, nondistended, positive bowel sounds, no masses no hepatosplenomegaly noted..  Neuro: No focal neurological deficits noted cranial nerves II through XII grossly intact. DTRs 2+ bilaterally upper and lower extremities. Strength 5 out of 5 in bilateral upper and lower extremities. Musculoskeletal: No warm swelling or erythema around joints, no spinal tenderness noted. Psychiatric: Patient alert and oriented x3, good insight and cognition, good recent to remote recall. Lymph node survey: No cervical axillary or inguinal lymphadenopathy noted.   Data Reviewed: Basic Metabolic Panel: Recent Labs  Lab 10/09/21 1040 10/10/21 0809  NA 140 140  K 4.1 4.1  CL 108 109  CO2 26 26  GLUCOSE 95 106*  BUN 11 14  CREATININE 0.91 0.71  CALCIUM 9.2 8.7*   Liver Function Tests: Recent Labs  Lab 10/09/21 1040 10/10/21 0809  AST 21 20  ALT 16 17  ALKPHOS 45 43  BILITOT 1.2 1.3*  PROT 7.8 6.2*  ALBUMIN 4.1 3.5   No results for input(s): LIPASE, AMYLASE in the last 168 hours. No results for input(s): AMMONIA in the last 168 hours. CBC: Recent Labs  Lab 10/09/21 1040 10/10/21 0809  WBC 7.8 14.6*   NEUTROABS 3.7 10.4*  HGB 10.8* 9.4*  HCT 28.8* 25.7*  MCV 84.7 85.7  PLT 487* 408*   Cardiac Enzymes: No results for input(s): CKTOTAL, CKMB, CKMBINDEX, TROPONINI in the last 168 hours. BNP (last 3 results) No results for input(s): BNP in the last 8760 hours.  ProBNP (last 3 results) No results for input(s): PROBNP in the last 8760 hours.  CBG: No results for input(s): GLUCAP in the last 168 hours.  No results found for this or any previous visit (from the past 240 hour(s)).   Studies: DG Tibia/Fibula Right  Result Date: 10/09/2021 CLINICAL DATA:  Right leg pain for 1 week.  Sickle cell disease. EXAM: RIGHT TIBIA AND FIBULA - 2 VIEW COMPARISON:  10/26/2020 FINDINGS: There is no evidence of fracture or other focal bone lesions. Soft tissues are unremarkable. IMPRESSION: Negative. Electronically Signed   By: Danae Orleans M.D.   On: 10/09/2021 10:56    Scheduled Meds:  enoxaparin (LOVENOX) injection  40 mg Subcutaneous Q24H   HYDROmorphone   Intravenous Q4H   ketorolac  15 mg Intravenous Q6H   senna-docusate  1 tablet Oral BID   Continuous Infusions:  sodium chloride 125 mL/hr at 10/09/21 1212   dextrose 5 % and 0.45% NaCl 125 mL/hr at 10/10/21 0221    Active Problems:   Sickle cell pain crisis (HCC)   Chronic narcotic use   Current mild episode of major depressive disorder (HCC)   Sickle cell anemia with crisis (HCC)

## 2021-10-10 NOTE — Plan of Care (Signed)
  Problem: Education: Goal: Knowledge of vaso-occlusive preventative measures will improve Outcome: Progressing   Problem: Education: Goal: Knowledge of General Education information will improve Description: Including pain rating scale, medication(s)/side effects and non-pharmacologic comfort measures Outcome: Progressing   Problem: Activity: Goal: Risk for activity intolerance will decrease Outcome: Progressing   

## 2021-10-10 NOTE — Plan of Care (Signed)

## 2021-10-11 DIAGNOSIS — D5700 Hb-SS disease with crisis, unspecified: Secondary | ICD-10-CM

## 2021-10-11 NOTE — Progress Notes (Signed)
  Transition of Care [facility]) Screening Note   Patient Details  Name: Agam Tuohy Date of Birth: 1970/05/24   Transition of Care [facility]) CM/SW Contact:    Amada Jupiter, LCSW Phone Number: 10/11/2021, 1:19 PM    Transition of Care Department [facility]) has reviewed patient and no TOC needs have been identified at this time. We will continue to monitor patient advancement through interdisciplinary progression rounds. If new patient transition needs arise, please place a TOC consult.

## 2021-10-11 NOTE — Progress Notes (Signed)
D/C instructions given to patient. Patient had no questions. Patient calling her ride and getting dressed. NT or RN will wheel patient out once her ride is here.

## 2021-10-11 NOTE — Discharge Summary (Signed)
Physician Discharge Summary  Lindsey [name] EKC:[id number] DOB: 02-16-1971 DOA: 10/09/2021  PCP: Massie Maroon, FNP  Admit date: 10/09/2021  Discharge date: 10/11/2021  Discharge Diagnoses:  Active Problems:   Chronic narcotic use   Current mild episode of major depressive disorder (HCC)   Sickle cell pain crisis (HCC)   Sickle cell anemia with crisis [facility])   Discharge Condition: Stable  Disposition:   Follow-up Information     Massie Maroon, FNP Follow up.   Specialty: Family Medicine Contact information: [address] [place] [address] [phone]                Pt is discharged home in good condition and is to follow up with Massie Maroon, FNP this week to have labs evaluated. Lindsey [name] is instructed to increase activity slowly and balance with rest for the next few days, and use prescribed medication to complete treatment of pain  Diet: Regular Wt Readings from Last 3 Encounters:  10/09/21 63.9 kg  08/16/21 63.9 kg  07/18/21 67.6 kg    History of present illness:  Lindsey [name] is a 51 year old female with a medical history significant for sickle cell disease, anemia of chronic disease, chronic pain syndrome, osteoarthritis of the shoulder and right leg who presented to the ER with uncontrolled pain involving her right leg.  Patient rated pain as 10/10.  Pain is characterized as persistent.  Patient is taking her home opiate medication regimen with no relief.  She has been dealing with it for around 2 days and seeing that no relief is coming, patient decided to come to the ER for further evaluation and treatment.  Denies any fever or chills.  In the ER, she has received up to 6 mg IV Dilaudid with no resolution, so patient is being admitted for further evaluation and treatment.  Hospital Course:  Sickle cell disease with pain crisis: Patient was admitted for sickle cell pain crisis and managed appropriately with IVF,  IV Dilaudid via PCA and IV Toradol, as well as other adjunct therapies per sickle cell pain management protocols. Patient states that pain intensity has improved and she is requesting discharge home.  She rates her pain as 2/10.  Patient will follow-up on 10/12/2021 for post hospital follow-up.  At that time, we will repeat labs including CBC with differential. Patient is alert, oriented, and ambulating without assistance.  Patient was therefore discharged home today in a hemodynamically stable condition.   Lindsey [name] will follow-up with PCP within 1 week of this discharge. Lindsey [name] was counseled extensively about nonpharmacologic means of pain management, patient verbalized understanding and was appreciative of  the care received during this admission.   We discussed the need for good hydration, monitoring of hydration status, avoidance of heat, cold, stress, and infection triggers. We discussed the need to be adherent with taking home medications. Patient was reminded of the need to seek medical attention immediately if any symptom of bleeding, anemia, or infection occurs.  Discharge Exam: Vitals:   10/11/21 1126 10/11/21 1330  BP:  113/75  Pulse:  64  Resp: 18 18  Temp:  98.3 F (36.8 C)  SpO2: 100% 100%   Vitals:   10/11/21 0734 10/11/21 0947 10/11/21 1126 10/11/21 1330  BP:  111/70  113/75  Pulse:  (!) 57  64  Resp: 14 17 18 18   Temp:  98 F (36.7 C)  98.3 F (36.8 C)  TempSrc:  Oral  Oral  SpO2: 100% 100% 100% 100%  Weight:      Height:        General appearance : Awake, alert, not in any distress. Speech Clear. Not toxic looking HEENT: Atraumatic and Normocephalic, pupils equally reactive to light and accomodation Neck: Supple, no JVD. No cervical lymphadenopathy.  Chest: Good air entry bilaterally, no added sounds  CVS: S1 S2 regular, no murmurs.  Abdomen: Bowel sounds present, Non tender and not distended with no gaurding, rigidity or rebound. Extremities: B/L Lower Ext  shows no edema, both legs are warm to touch Neurology: Awake alert, and oriented X 3, CN II-XII intact, Non focal Skin: No Rash  Discharge Instructions  Discharge Instructions     Discharge patient   Complete by: As directed    Discharge disposition: 01-Home or Self Care   Discharge patient date: 10/11/2021      Allergies as of 10/11/2021       Reactions   Ketamine Anxiety   Tachycardia        Medication List     TAKE these medications    Benadryl Allergy 25 MG tablet Generic drug: diphenhydrAMINE Take 50 mg by mouth at bedtime as needed for sleep.   folic acid 1 MG tablet Commonly known as: FOLVITE Take 1 tablet (1 mg total) by mouth daily.   gabapentin 400 MG capsule Commonly known as: NEURONTIN TAKE 1 CAPSULE(400 MG) BY MOUTH THREE TIMES DAILY What changed: See the new instructions.   ibuprofen 800 MG tablet Commonly known as: ADVIL Take 1 tablet (800 mg total) by mouth every 8 (eight) hours as needed.   Melatonin 10 MG Tabs Take 1 tablet by mouth at bedtime.   oxyCODONE 10 mg 12 hr tablet Commonly known as: OXYCONTIN Take 1 tablet (10 mg total) by mouth every 12 (twelve) hours.   Oxycodone HCl 10 MG Tabs Take 1 tablet (10 mg total) by mouth every 6 (six) hours as needed for up to 15 days (pain).        The results of significant diagnostics from this hospitalization (including imaging, microbiology, ancillary and laboratory) are listed below for reference.    Significant Diagnostic Studies: DG Tibia/Fibula Right  Result Date: 10/09/2021 CLINICAL DATA:  Right leg pain for 1 week.  Sickle cell disease. EXAM: RIGHT TIBIA AND FIBULA - 2 VIEW COMPARISON:  10/26/2020 FINDINGS: There is no evidence of fracture or other focal bone lesions. Soft tissues are unremarkable. IMPRESSION: Negative. Electronically Signed   By: Danae Orleans M.D.   On: 10/09/2021 10:56    Microbiology: No results found for this or any previous visit (from the past 240 hour(s)).    Labs: Basic Metabolic Panel: Recent Labs  Lab 10/09/21 1040 10/10/21 0809  NA 140 140  K 4.1 4.1  CL 108 109  CO2 26 26  GLUCOSE 95 106*  BUN 11 14  CREATININE 0.91 0.71  CALCIUM 9.2 8.7*   Liver Function Tests: Recent Labs  Lab 10/09/21 1040 10/10/21 0809  AST 21 20  ALT 16 17  ALKPHOS 45 43  BILITOT 1.2 1.3*  PROT 7.8 6.2*  ALBUMIN 4.1 3.5   No results for input(s): LIPASE, AMYLASE in the last 168 hours. No results for input(s): AMMONIA in the last 168 hours. CBC: Recent Labs  Lab 10/09/21 1040 10/10/21 0809  WBC 7.8 14.6*  NEUTROABS 3.7 10.4*  HGB 10.8* 9.4*  HCT 28.8* 25.7*  MCV 84.7 85.7  PLT 487* 408*   Cardiac Enzymes: No results for  input(s): CKTOTAL, CKMB, CKMBINDEX, TROPONINI in the last 168 hours. BNP: Invalid input(s): POCBNP CBG: No results for input(s): GLUCAP in the last 168 hours.  Time coordinating discharge: 30 minutes  Signed: Nolon NationsLachina Moore Korvin Valentine  APRN, MSN, FNP-C Patient Care [facility] [address]  [place], [place] [id number] [phone]  [facility] 10/11/2021, 6:31 PM

## 2021-10-12 ENCOUNTER — Ambulatory Visit: Payer: Medicaid Other | Admitting: Family Medicine

## 2021-10-13 ENCOUNTER — Other Ambulatory Visit: Payer: Self-pay | Admitting: Family Medicine

## 2021-10-22 ENCOUNTER — Telehealth: Payer: Self-pay

## 2021-10-22 ENCOUNTER — Other Ambulatory Visit: Payer: Self-pay | Admitting: Family Medicine

## 2021-10-22 NOTE — Telephone Encounter (Signed)
Oxycodone 10mg

## 2021-10-25 ENCOUNTER — Other Ambulatory Visit: Payer: Self-pay | Admitting: Family Medicine

## 2021-10-25 DIAGNOSIS — G894 Chronic pain syndrome: Secondary | ICD-10-CM

## 2021-10-25 DIAGNOSIS — D5700 Hb-SS disease with crisis, unspecified: Secondary | ICD-10-CM

## 2021-10-25 MED ORDER — OXYCODONE HCL 10 MG PO TABS: 10.0000 mg | ORAL_TABLET | Freq: Four times a day (QID) | ORAL | 0 refills | Status: DC | PRN

## 2021-10-25 MED ORDER — OXYCODONE HCL 10 MG PO TABS: 60.0000 mg | ORAL_TABLET | Freq: Four times a day (QID) | ORAL | 0 refills | Status: DC | PRN

## 2021-10-25 NOTE — Progress Notes (Signed)
Reviewed PDMP substance reporting system prior to prescribing opiate medications. No inconsistencies noted.  Meds ordered this encounter  Medications   Oxycodone HCl 10 MG TABS    Sig: Take 6 tablets (60 mg total) by mouth every 6 (six) hours as needed.    Dispense:  60 tablet    Refill:  0    Order Specific Question:   Supervising Provider    Answer:   Quentin Angst [[id number]]   [name]  APRN, MSN, FNP-C Patient Care [facility] [address] [place], [place] [address] [phone]

## 2021-10-25 NOTE — Progress Notes (Signed)
Typo on first prescription.   [name]  APRN, MSN, FNP-C Patient Care [facility] [address] [place], [place] [address] [phone]

## 2021-10-27 ENCOUNTER — Inpatient Hospital Stay (HOSPITAL_COMMUNITY)
Admission: EM | Admit: 2021-10-27 | Discharge: 2021-10-30 | DRG: 812 | Disposition: A | Discharge status: Home / Self Care | Payer: Medicaid Other | Attending: Internal Medicine | Admitting: Internal Medicine

## 2021-10-27 ENCOUNTER — Encounter (HOSPITAL_COMMUNITY): Payer: Self-pay

## 2021-10-27 ENCOUNTER — Other Ambulatory Visit: Payer: Self-pay

## 2021-10-27 DIAGNOSIS — D5700 Hb-SS disease with crisis, unspecified: Secondary | ICD-10-CM | POA: Diagnosis present

## 2021-10-27 DIAGNOSIS — Z79899 Other long term (current) drug therapy: Secondary | ICD-10-CM | POA: Diagnosis not present

## 2021-10-27 DIAGNOSIS — M79604 Pain in right leg: Secondary | ICD-10-CM | POA: Diagnosis not present

## 2021-10-27 DIAGNOSIS — F1121 Opioid dependence, in remission: Secondary | ICD-10-CM | POA: Diagnosis present

## 2021-10-27 DIAGNOSIS — D638 Anemia in other chronic diseases classified elsewhere: Secondary | ICD-10-CM

## 2021-10-27 DIAGNOSIS — Z888 Allergy status to other drugs, medicaments and biological substances status: Secondary | ICD-10-CM | POA: Diagnosis not present

## 2021-10-27 DIAGNOSIS — F17210 Nicotine dependence, cigarettes, uncomplicated: Secondary | ICD-10-CM | POA: Diagnosis present

## 2021-10-27 DIAGNOSIS — G894 Chronic pain syndrome: Secondary | ICD-10-CM | POA: Diagnosis present

## 2021-10-27 DIAGNOSIS — G8929 Other chronic pain: Secondary | ICD-10-CM | POA: Diagnosis present

## 2021-10-27 LAB — COMPREHENSIVE METABOLIC PANEL
ALT: 18 U/L (ref 0–44)
AST: 22 U/L (ref 15–41)
Albumin: 4.8 g/dL (ref 3.5–5.0)
Alkaline Phosphatase: 54 U/L (ref 38–126)
Anion gap: 7 (ref 5–15)
BUN: 12 mg/dL (ref 6–20)
CO2: 26 mmol/L (ref 22–32)
Calcium: 9.7 mg/dL (ref 8.9–10.3)
Chloride: 110 mmol/L (ref 98–111)
Creatinine, Ser: 0.97 mg/dL (ref 0.44–1.00)
GFR, Estimated: >60 mL/min (ref >60)
Glucose, Bld: 90 mg/dL (ref 70–99)
Potassium: 4 mmol/L (ref 3.5–5.1)
Sodium: 143 mmol/L (ref 135–145)
Total Bilirubin: 1.3 mg/dL — ABNORMAL HIGH (ref 0.3–1.2)
Total Protein: 8.3 g/dL — ABNORMAL HIGH (ref 6.5–8.1)

## 2021-10-27 LAB — CBC WITH DIFFERENTIAL/PLATELET
Abs Immature Granulocytes: 0.03 10*3/uL (ref 0.00–0.07)
Basophils Absolute: 0.1 10*3/uL (ref 0.0–0.1)
Basophils Relative: 0 %
Eosinophils Absolute: 0.4 10*3/uL (ref 0.0–0.5)
Eosinophils Relative: 3 %
HCT: 30 % — ABNORMAL LOW (ref 36.0–46.0)
Hemoglobin: 11.3 g/dL — ABNORMAL LOW (ref 12.0–15.0)
Immature Granulocytes: 0 %
Lymphocytes Relative: 41 %
Lymphs Abs: 5.4 10*3/uL — ABNORMAL HIGH (ref 0.7–4.0)
MCH: 31.3 pg (ref 26.0–34.0)
MCHC: 37.7 g/dL — ABNORMAL HIGH (ref 30.0–36.0)
MCV: 83.1 fL (ref 80.0–100.0)
Monocytes Absolute: 0.9 10*3/uL (ref 0.1–1.0)
Monocytes Relative: 7 %
Neutro Abs: 6.7 10*3/uL (ref 1.7–7.7)
Neutrophils Relative %: 49 %
Platelets: 470 10*3/uL — ABNORMAL HIGH (ref 150–400)
RBC: 3.61 MIL/uL — ABNORMAL LOW (ref 3.87–5.11)
RDW: 14.2 % (ref 11.5–15.5)
WBC: 13.4 10*3/uL — ABNORMAL HIGH (ref 4.0–10.5)
nRBC: 0.2 % (ref 0.0–0.2)

## 2021-10-27 LAB — I-STAT BETA HCG BLOOD, ED (MC, WL, AP ONLY): I-stat hCG, quantitative: <5 m[IU]/mL (ref <5)

## 2021-10-27 LAB — RETICULOCYTES
Immature Retic Fract: 23.9 % — ABNORMAL HIGH (ref 2.3–15.9)
RBC.: 3.67 MIL/uL — ABNORMAL LOW (ref 3.87–5.11)
Retic Count, Absolute: 110.8 10*3/uL (ref 19.0–186.0)
Retic Ct Pct: 3 % (ref 0.4–3.1)

## 2021-10-27 MED ORDER — HYDROMORPHONE HCL 2 MG/ML IJ SOLN
2.0000 mg | INTRAMUSCULAR | Status: AC
  Administered 2021-10-27: 2 mg via INTRAVENOUS
  Filled 2021-10-27: qty 1

## 2021-10-27 MED ORDER — KETOROLAC TROMETHAMINE 15 MG/ML IJ SOLN
15.0000 mg | INTRAMUSCULAR | Status: AC
  Administered 2021-10-27: 15 mg via INTRAVENOUS
  Filled 2021-10-27: qty 1

## 2021-10-27 MED ORDER — SODIUM CHLORIDE 0.45 % IV BOLUS
500.0000 mL | Freq: Once | INTRAVENOUS | Status: AC
  Administered 2021-10-27: 500 mL via INTRAVENOUS

## 2021-10-27 MED ORDER — ONDANSETRON HCL 4 MG/2ML IJ SOLN
4.0000 mg | INTRAMUSCULAR | Status: DC | PRN
  Administered 2021-10-27: 4 mg via INTRAVENOUS
  Filled 2021-10-27: qty 2

## 2021-10-27 MED ORDER — SODIUM CHLORIDE 0.9 % IV SOLN
12.5000 mg | Freq: Once | INTRAVENOUS | Status: AC
  Administered 2021-10-27: 12.5 mg via INTRAVENOUS
  Filled 2021-10-27: qty 12.5

## 2021-10-27 MED ORDER — SODIUM CHLORIDE 0.45 % IV SOLN: INTRAVENOUS | Status: DC

## 2021-10-27 MED ORDER — HYDROMORPHONE HCL 2 MG/ML IJ SOLN
2.0000 mg | Freq: Once | INTRAMUSCULAR | Status: AC
  Administered 2021-10-27: 2 mg via INTRAVENOUS
  Filled 2021-10-27: qty 1

## 2021-10-27 NOTE — ED Provider Triage Note (Signed)
Emergency Medicine Provider Triage Evaluation Note  Lindsey [name] , a 51 y.o. female  was evaluated in triage.  Pt complains of sudden right knee pain.  Hx of SCD.  Rated 10/10.  Has tried heating pads and pain medication from home, with no help.  No other complaints at this time.  Review of Systems  Positive:  Negative: See above  Physical Exam  BP (!) 143/86   Pulse (!) 121   Temp 98.7 F (37.1 C) (Oral)   Resp (!) 25   Ht 5' (1.524 m)   Wt 63.5 kg   LMP 07/09/2021 (Approximate)   SpO2 100%   BMI 27.34 kg/m  Gen:   Awake, clinically appears in significant pain Resp:  Normal effort  MSK:   Moves extremities without difficulty  Other:  Significant TTP of the right knee  Medical Decision Making  Medically screening exam initiated at 6:56 PM.  Appropriate orders placed.  Lindsey [name] was informed that the remainder of the evaluation will be completed by another provider, this initial triage assessment does not replace that evaluation, and the importance of remaining in the ED until their evaluation is complete.  Discussed with ED nursing staff.  Patient being brought directly to the next available room.   Cecil Cobbs, PA-C 10/27/21 1858

## 2021-10-27 NOTE — ED Provider Notes (Cosign Needed)
[facility]-EMERGENCY DEPT Provider Note   CSN: [id number] Arrival date & time: 10/27/21  1813     History  Chief Complaint  Patient presents with   Sickle Cell Pain Crisis    Lindsey [name] is a 51 y.o. female.  Patient with medical history significant of sickle cell disease, anemia of chronic disease, chronic pain syndrome, osteoarthritis --presents to the emergency department for evaluation of right leg pain.  Patient reports severe pain in the right thigh and knee without swelling or redness.  Symptoms started a couple of hours ago.  Family member at bedside states that she was active, doing chores today, grooming the dog.  She then developed pain that was unresponsive to home pain medication regimen.  She denies fever, URI symptoms, chest pain, shortness of breath or cough.  No abdominal pain, vomiting or diarrhea.  No urinary symptoms.  She is currently taking oxycodone 10 mg tablets and OxyContin 10 mg.      Home Medications Prior to Admission medications   Medication Sig Start Date End Date Taking? Authorizing Provider  diphenhydrAMINE (BENADRYL ALLERGY) 25 MG tablet Take 50 mg by mouth at bedtime as needed for sleep.    [provider]  folic acid (FOLVITE) 1 MG tablet Take 1 tablet (1 mg total) by mouth daily. Patient not taking: Reported on 10/09/2021 03/30/21 03/30/22  Massie Maroon, FNP  gabapentin (NEURONTIN) 400 MG capsule TAKE 1 CAPSULE(400 MG) BY MOUTH THREE TIMES DAILY Patient taking differently: Take 400 mg by mouth 3 (three) times daily. 09/15/21   Massie Maroon, FNP  ibuprofen (ADVIL) 800 MG tablet Take 1 tablet (800 mg total) by mouth every 8 (eight) hours as needed. Patient not taking: Reported on 10/09/2021 05/20/21   Massie Maroon, FNP  Melatonin 10 MG TABS Take 1 tablet by mouth at bedtime. Patient not taking: Reported on 10/09/2021    [provider]  oxyCODONE (OXYCONTIN) 10 mg 12 hr tablet Take 1 tablet (10  mg total) by mouth every 12 (twelve) hours. 09/30/21   Massie Maroon, FNP  Oxycodone HCl 10 MG TABS Take 1 tablet (10 mg total) by mouth every 6 (six) hours as needed. 10/25/21   Massie Maroon, FNP      Allergies    Ketamine    Review of Systems   Review of Systems  Physical Exam Updated Vital Signs BP 114/72   Pulse 91   Temp 98.4 F (36.9 C) (Oral)   Resp (!) 22   Ht 5' (1.524 m)   Wt 63.5 kg   LMP 07/09/2021 (Approximate)   SpO2 100%   BMI 27.34 kg/m  Physical Exam Vitals and nursing note reviewed.  Constitutional:      General: She is in acute distress.     Appearance: She is well-developed.     Comments: Patient is tearful and appears to be very uncomfortable.  She is holding and rubbing her right thigh and knee areas.  HENT:     Head: Normocephalic and atraumatic.     Right Ear: External ear normal.     Left Ear: External ear normal.     Nose: Nose normal.  Eyes:     Conjunctiva/sclera: Conjunctivae normal.  Cardiovascular:     Rate and Rhythm: Normal rate and regular rhythm.     Heart sounds: No murmur heard. Pulmonary:     Effort: No respiratory distress.     Breath sounds: No wheezing, rhonchi or rales.  Abdominal:     Palpations: Abdomen is soft.     Tenderness: There is no abdominal tenderness. There is no guarding or rebound.  Musculoskeletal:     Cervical back: Normal range of motion and neck supple.     Right lower leg: No edema.     Left lower leg: No edema.     Comments: No lower extremity edema.  No erythema or warmth noted right lower extremity.  No distal swelling.  Normal DP pulses.  Skin:    General: Skin is warm and dry.     Findings: No rash.  Neurological:     General: No focal deficit present.     Mental Status: She is alert. Mental status is at baseline.     Motor: No weakness.  Psychiatric:        Mood and Affect: Mood normal.    ED Results / Procedures / Treatments   Labs (all labs ordered are listed, but only abnormal  results are displayed) Labs Reviewed  RETICULOCYTES - Abnormal; Notable for the following components:      Result Value   RBC. 3.67 (*)    Immature Retic Fract 23.9 (*)    All other components within normal limits  COMPREHENSIVE METABOLIC PANEL  CBC WITH DIFFERENTIAL/PLATELET  I-STAT BETA HCG BLOOD, ED (MC, [facility], [facility] ONLY)    EKG None  Radiology No results found.  Procedures Procedures    Medications Ordered in ED Medications  0.45 % sodium chloride infusion ( Intravenous New Bag/Given 10/27/21 1941)  HYDROmorphone (DILAUDID) injection 2 mg (has no administration in time range)  HYDROmorphone (DILAUDID) injection 2 mg (has no administration in time range)  diphenhydrAMINE (BENADRYL) 12.5 mg in sodium chloride 0.9 % 50 mL IVPB (has no administration in time range)  ondansetron (ZOFRAN) injection 4 mg (4 mg Intravenous Given 10/27/21 1936)  ketorolac (TORADOL) 15 MG/ML injection 15 mg (15 mg Intravenous Given 10/27/21 1935)  HYDROmorphone (DILAUDID) injection 2 mg (2 mg Intravenous Given 10/27/21 1937)    ED Course/ Medical Decision Making/ A&P    Patient seen and examined. History obtained directly from patient.  Also obtained additional history from family member at bedside and reviewed previous hospitalization notes.  Symptoms tonight seem consistent with previous flares.  Labs/EKG: Ordered CBC, CMP, reticulocytes.  Imaging: None ordered.  Medications/Fluids: Ordered: IV Dilaudid, IV Toradol.  As needed meds per protocol.    Most recent vital signs reviewed and are as follows: BP 114/72   Pulse 91   Temp 98.4 F (36.9 C) (Oral)   Resp (!) 22   Ht 5' (1.524 m)   Wt 63.5 kg   LMP 07/09/2021 (Approximate)   SpO2 100%   BMI 27.34 kg/m   Initial impression: Patient with sickle cell anemia with acute vaso-occlusive pain crisis.  9:43 PM Reassessment performed. Patient appears uncomfortable.  She states that she has had minimal improvement in pain, but she does not want  to stay in the hospital.  Labs personally reviewed and interpreted including: CBC showing white blood cell count of 13.4, hemoglobin 11.3, sickle cells noted; CMP minimal elevation in total bili at 1.3; negative pregnancy; reticulocyte count negative  Reviewed pertinent lab work and imaging with patient at bedside. Questions answered.   Most current vital signs reviewed and are as follows: BP (!) 98/57   Pulse 72   Temp 98.4 F (36.9 C) (Oral)   Resp 18   Ht 5' (1.524 m)   Wt 63.5 kg  LMP 07/09/2021 (Approximate)   SpO2 99%   BMI 27.34 kg/m   Plan: 1 additional dose of IV pain medication and reassessment.  If pain continues to be poorly controlled, will need admission to the hospital.  10:00 PM Signout to [facility] PA-C at shift change, who will reassess in the next hour.                           Medical Decision Making Amount and/or Complexity of Data Reviewed Labs: ordered.  Risk Prescription drug management.   Patient with lower extremity pain without swelling or signs of hemarthrosis, infection in setting of sickle cell anemia.  This is patient's typical presentation.  Lab work-up is reassuring.  No severe anemia.  No chest pain or shortness of breath to suggest acute chest syndrome.  Awaiting completion of ED therapy to determine appropriate disposition.        Final Clinical Impression(s) / ED Diagnoses Final diagnoses:  Pain of right lower extremity  Vasoocclusive sickle cell crisis [facility])    Rx / DC Orders ED Discharge Orders     None         Carlisle Cater, PA-C 10/27/21 2150

## 2021-10-27 NOTE — ED Provider Notes (Incomplete)
22:00: Assumed care of patient from PA Geiple @ shift change pending re-assessment & disposition.  Briefly patient is a 51 year old female with a history of sickle cell anemia who presented to the emergency department with complaints of right lower extremity pain consistent with her sickle cell pain in the past.  Denies chest pain, dyspnea, fever, or cough.   Labs reviewed and fairly reassuring.  Patient has received 4 doses of Dilaudid as well as 1 dose of Toradol, on reassessment at 2240 she remains very uncomfortable, tearful, I discussed management options, patient in agreement with hospitalist admission.  On my evaluation of the patient she is ranging all major joints, she has a 2+ DP and PT pulse.  23:00: CONSULT: Discussed with hospitalist Dr. Emmit Pomfret who accepts admission.

## 2021-10-27 NOTE — H&P (Signed)
History and Physical   [facility]  @ [place] [facility] Admission History and Physical [employer], D.O.    Patient Name: Lindsey [name] MR#: [id number] Date of Birth: Mar 05, 1971 Date of Admission: 10/27/2021  Referring MD/NP/PA: Russella Dar Primary Care Physician: Massie Maroon, FNP  Chief Complaint:  Chief Complaint  Patient presents with   Sickle Cell Pain Crisis    HPI: Lindsey [name] is a 51 y.o. female with a known history of sickle cell disease, anemia of chronic disease, osteoarthritis presents to the emergency department for evaluation of several hours history of right leg pain consistent with previous sickle cell crises.  Patient was in her usual state of health until today when she developed right leg pain that was refractory to her home medication regimen of OxyContin and oxycodone  Patient denies fevers/chills, weakness, dizziness, chest pain, shortness of breath, N/V/C/D, abdominal pain, dysuria/frequency, changes in mental status.    Otherwise there has been no change in status. Patient has been taking medication as prescribed and there has been no recent change in medication or diet.  No recent antibiotics.  There has been no recent illness, hospitalizations, travel or sick contacts.    EMS/ED Course: Patient received half-normal saline, Benadryl, 8 mg of Dilaudid, Toradol, Zofran and normal saline. Medical admission has been requested for further management of sickle cell pain crisis.  Review of Systems:  CONSTITUTIONAL: No fever/chills, fatigue, weakness, weight gain/loss, headache. EYES: No blurry or double vision. ENT: No tinnitus, postnasal drip, redness or soreness of the oropharynx. RESPIRATORY: No cough, dyspnea, wheeze.  No hemoptysis.  CARDIOVASCULAR: No chest pain, palpitations, syncope, orthopnea. No lower extremity edema.  GASTROINTESTINAL: No nausea, vomiting, abdominal pain, diarrhea, constipation.  No hematemesis,  melena or hematochezia. GENITOURINARY: No dysuria, frequency, hematuria. ENDOCRINE: No polyuria or nocturia. No heat or cold intolerance. HEMATOLOGY: No anemia, bruising, bleeding. INTEGUMENTARY: No rashes, ulcers, lesions. MUSCULOSKELETAL: Positive right leg pain no arthritis, gout, dyspnea. NEUROLOGIC: No numbness, tingling, ataxia, seizure-type activity, weakness. PSYCHIATRIC: No anxiety, depression, insomnia.   Past Medical History:  Diagnosis Date   Acute kidney injury (HCC) 01/18/2021   Acute pain of left shoulder    Cough 06/27/2020   Hypokalemia 06/27/2020   Hypoxia    Leukocytosis 09/03/2016   Opiate abuse, episodic (HCC) 09/25/2017   Opioid dependence in remission (HCC)    Right leg pain    Sickle cell crisis [facility])     Past Surgical History:  Procedure Laterality Date   APPENDECTOMY     CESAREAN SECTION     OTHER SURGICAL HISTORY     c-section     reports that she has been smoking cigars and cigarettes. She has never used smokeless tobacco. She reports current alcohol use. She reports that she does not use drugs.  Allergies  Allergen Reactions   Ketamine Anxiety    Tachycardia    Family History  Problem Relation Age of Onset   Stroke Neg Hx        none that she knows of    Seizures Neg Hx     Prior to Admission medications   Medication Sig Start Date End Date Taking? Authorizing Provider  diphenhydrAMINE (BENADRYL ALLERGY) 25 MG tablet Take 50 mg by mouth at bedtime as needed for sleep.    [provider]  folic acid (FOLVITE) 1 MG tablet Take 1 tablet (1 mg total) by mouth daily. Patient not taking: Reported on 10/09/2021 03/30/21 03/30/22  Massie Maroon, FNP  gabapentin (NEURONTIN)  400 MG capsule TAKE 1 CAPSULE(400 MG) BY MOUTH THREE TIMES DAILY Patient taking differently: Take 400 mg by mouth 3 (three) times daily. 09/15/21   Dorena Dew, FNP  ibuprofen (ADVIL) 800 MG tablet Take 1 tablet (800 mg total) by mouth every 8 (eight) hours as  needed. Patient not taking: Reported on 10/09/2021 05/20/21   Dorena Dew, FNP  Melatonin 10 MG TABS Take 1 tablet by mouth at bedtime. Patient not taking: Reported on 10/09/2021    [provider]  oxyCODONE (OXYCONTIN) 10 mg 12 hr tablet Take 1 tablet (10 mg total) by mouth every 12 (twelve) hours. 09/30/21   Dorena Dew, FNP  Oxycodone HCl 10 MG TABS Take 1 tablet (10 mg total) by mouth every 6 (six) hours as needed. 10/25/21   Dorena Dew, FNP    Physical Exam: Vitals:   10/27/21 2130 10/27/21 2145 10/27/21 2215 10/27/21 2230  BP: (!) 98/57 (!) 111/55 (!) 103/52 95/78  Pulse: 72 70 76 70  Resp: 18 18 18    Temp:      TempSrc:      SpO2: 99% 98% 96% 98%  Weight:      Height:        GENERAL: 51 y.o.-year-old black female patient, well-developed, well-nourished lying in the bed in no acute distress.  Tearful HEENT: Head atraumatic, normocephalic. Pupils equal. Mucus membranes moist. NECK: Supple. No JVD. CHEST: Normal breath sounds bilaterally. No wheezing, rales, rhonchi or crackles. No use of accessory muscles of respiration.  No reproducible chest wall tenderness.  CARDIOVASCULAR: S1, S2 normal. No murmurs, rubs, or gallops. Cap refill <2 seconds. Pulses intact distally.  ABDOMEN: Soft, nondistended, nontender. No rebound, guarding, rigidity. Normoactive bowel sounds present in all four quadrants.  EXTREMITIES: No pedal edema, cyanosis, or clubbing. No calf tenderness or Homan's sign.  NEUROLOGIC: The patient is alert and oriented x 3. Cranial nerves II through XII are grossly intact with no focal sensorimotor deficit. PSYCHIATRIC:  Normal affect, mood, thought content. SKIN: Warm, dry, and intact without obvious rash, lesion, or ulcer.    Labs on Admission:  CBC: Recent Labs  Lab 10/27/21 1824  WBC 13.4*  NEUTROABS 6.7  HGB 11.3*  HCT 30.0*  MCV 83.1  PLT [id number]*   Basic Metabolic Panel: Recent Labs  Lab 10/27/21 1824  NA 143  K 4.0  CL 110   CO2 26  GLUCOSE 90  BUN 12  CREATININE 0.97  CALCIUM 9.7   GFR: Estimated Creatinine Clearance: 58.4 mL/min (by C-G formula based on SCr of 0.97 mg/dL). Liver Function Tests: Recent Labs  Lab 10/27/21 1824  AST 22  ALT 18  ALKPHOS 54  BILITOT 1.3*  PROT 8.3*  ALBUMIN 4.8   No results for input(s): "LIPASE", "AMYLASE" in the last 168 hours. No results for input(s): "AMMONIA" in the last 168 hours. Coagulation Profile: No results for input(s): "INR", "PROTIME" in the last 168 hours. Cardiac Enzymes: No results for input(s): "CKTOTAL", "CKMB", "CKMBINDEX", "TROPONINI" in the last 168 hours. BNP (last 3 results) No results for input(s): "PROBNP" in the last 8760 hours. HbA1C: No results for input(s): "HGBA1C" in the last 72 hours. CBG: No results for input(s): "GLUCAP" in the last 168 hours. Lipid Profile: No results for input(s): "CHOL", "HDL", "LDLCALC", "TRIG", "CHOLHDL", "LDLDIRECT" in the last 72 hours. Thyroid Function Tests: No results for input(s): "TSH", "T4TOTAL", "FREET4", "T3FREE", "THYROIDAB" in the last 72 hours. Anemia Panel: Recent Labs    10/27/21 1824  RETICCTPCT 3.0   Urine analysis:    Component Value Date/Time   COLORURINE YELLOW 05/31/2021 1350   APPEARANCEUR TURBID (A) 05/31/2021 1350   LABSPEC 1.012 05/31/2021 1350   PHURINE 5.0 05/31/2021 1350   GLUCOSEU NEGATIVE 05/31/2021 1350   HGBUR SMALL (A) 05/31/2021 1350   BILIRUBINUR NEGATIVE 05/31/2021 1350   BILIRUBINUR neg 04/07/2020 1014   KETONESUR NEGATIVE 05/31/2021 1350   PROTEINUR 100 (A) 05/31/2021 1350   UROBILINOGEN 0.2 04/07/2020 1014   UROBILINOGEN 1.0 12/25/2012 1755   NITRITE NEGATIVE 05/31/2021 1350   LEUKOCYTESUR MODERATE (A) 05/31/2021 1350   Sepsis Labs: @LABRCNTIP (procalcitonin:4,lacticidven:4) )No results found for this or any previous visit (from the past 240 hour(s)).   Radiological Exams on Admission: No results found.   Assessment/Plan  This is a 51 y.o.  female with a history of sickle cell disease, anemia of chronic disease, osteoarthritis now being admitted with:  #.  Sickle cell pain crisis - Admit inpatient - Pain control and antihistamines as needed - O2 as needed - Warm packs -Continue folic acid, oxycodone, gabapentin - We will need sickle cell team consult in a.m.  Admission status: Inpatient IV Fluids: Normal saline Diet/Nutrition: Regular Consults called: Hematology in a.m. DVT Px: Lovenox, SCDs and early ambulation. Code Status: Full Code  Disposition Plan: To home in 1-2 days  All the records are reviewed and case discussed with ED provider. Management plans discussed with the patient and/or family who express understanding and agree with plan of care.  Armenta Erskin D.O. on 10/27/2021 at 10:46 PM CC: Primary care physician; 10/29/2021, FNP   10/27/2021, 10:46 PM

## 2021-10-27 NOTE — ED Triage Notes (Signed)
Pt reports sickle cell pain in right leg over the past few days. Pt is tearful in triage. Pt reports trying home medications without relief.

## 2021-10-28 DIAGNOSIS — D5700 Hb-SS disease with crisis, unspecified: Principal | ICD-10-CM

## 2021-10-28 LAB — CBC
HCT: 24.1 % — ABNORMAL LOW (ref 36.0–46.0)
Hemoglobin: 9.1 g/dL — ABNORMAL LOW (ref 12.0–15.0)
MCH: 31.6 pg (ref 26.0–34.0)
MCHC: 37.8 g/dL — ABNORMAL HIGH (ref 30.0–36.0)
MCV: 83.7 fL (ref 80.0–100.0)
Platelets: 360 10*3/uL (ref 150–400)
RBC: 2.88 MIL/uL — ABNORMAL LOW (ref 3.87–5.11)
RDW: 14.3 % (ref 11.5–15.5)
WBC: 10.7 10*3/uL — ABNORMAL HIGH (ref 4.0–10.5)
nRBC: 0.3 % — ABNORMAL HIGH (ref 0.0–0.2)

## 2021-10-28 LAB — CREATININE, SERUM
Creatinine, Ser: 1 mg/dL (ref 0.44–1.00)
GFR, Estimated: >60 mL/min (ref >60)

## 2021-10-28 MED ORDER — FAMOTIDINE 20 MG PO TABS
20.0000 mg | ORAL_TABLET | Freq: Two times a day (BID) | ORAL | Status: DC
Start: 2021-10-28 — End: 2021-10-30
  Administered 2021-10-28 – 2021-10-30 (×5): 20 mg via ORAL
  Filled 2021-10-28 (×5): qty 1

## 2021-10-28 MED ORDER — SODIUM CHLORIDE 0.45 % IV SOLN: INTRAVENOUS | Status: DC

## 2021-10-28 MED ORDER — ONDANSETRON HCL 4 MG/2ML IJ SOLN
4.0000 mg | INTRAMUSCULAR | Status: DC | PRN
  Filled 2021-10-28: qty 2

## 2021-10-28 MED ORDER — ADULT MULTIVITAMIN W/MINERALS CH
1.0000 | ORAL_TABLET | Freq: Every day | ORAL | Status: DC
  Administered 2021-10-28 – 2021-10-30 (×3): 1 via ORAL
  Filled 2021-10-28 (×3): qty 1

## 2021-10-28 MED ORDER — OXYCODONE HCL 5 MG PO TABS
10.0000 mg | ORAL_TABLET | Freq: Four times a day (QID) | ORAL | Status: AC | PRN
  Administered 2021-10-28 (×2): 10 mg via ORAL
  Filled 2021-10-28 (×2): qty 2

## 2021-10-28 MED ORDER — MELATONIN 5 MG PO TABS: 10.0000 mg | ORAL_TABLET | Freq: Every evening | ORAL | Status: DC | PRN

## 2021-10-28 MED ORDER — OXYCODONE HCL 5 MG PO TABS
10.0000 mg | ORAL_TABLET | ORAL | Status: DC | PRN
  Administered 2021-10-28 – 2021-10-29 (×5): 10 mg via ORAL
  Filled 2021-10-28 (×5): qty 2

## 2021-10-28 MED ORDER — POLYETHYLENE GLYCOL 3350 17 G PO PACK: 17.0000 g | PACK | Freq: Every day | ORAL | Status: DC | PRN

## 2021-10-28 MED ORDER — FAMOTIDINE IN NACL 20-0.9 MG/50ML-% IV SOLN
20.0000 mg | Freq: Two times a day (BID) | INTRAVENOUS | Status: DC
  Filled 2021-10-28 (×2): qty 50

## 2021-10-28 MED ORDER — DIPHENHYDRAMINE HCL 50 MG/ML IJ SOLN: 25.0000 mg | Freq: Four times a day (QID) | INTRAMUSCULAR | Status: DC | PRN

## 2021-10-28 MED ORDER — OXYCODONE HCL ER 10 MG PO T12A
10.0000 mg | EXTENDED_RELEASE_TABLET | Freq: Two times a day (BID) | ORAL | Status: DC
  Administered 2021-10-28 – 2021-10-30 (×5): 10 mg via ORAL
  Filled 2021-10-28 (×5): qty 1

## 2021-10-28 MED ORDER — KETOROLAC TROMETHAMINE 15 MG/ML IJ SOLN
15.0000 mg | Freq: Four times a day (QID) | INTRAMUSCULAR | Status: DC
  Administered 2021-10-28 – 2021-10-30 (×10): 15 mg via INTRAVENOUS
  Filled 2021-10-28 (×10): qty 1

## 2021-10-28 MED ORDER — GABAPENTIN 400 MG PO CAPS
400.0000 mg | ORAL_CAPSULE | Freq: Three times a day (TID) | ORAL | Status: DC
  Administered 2021-10-28 – 2021-10-30 (×7): 400 mg via ORAL
  Filled 2021-10-28 (×7): qty 1

## 2021-10-28 MED ORDER — HYDROMORPHONE HCL 1 MG/ML IJ SOLN
1.0000 mg | INTRAMUSCULAR | Status: DC | PRN
  Administered 2021-10-28: 1 mg via INTRAVENOUS
  Filled 2021-10-28: qty 1

## 2021-10-28 MED ORDER — HYDROMORPHONE HCL 2 MG/ML IJ SOLN
2.0000 mg | INTRAMUSCULAR | Status: DC | PRN
  Administered 2021-10-28 – 2021-10-30 (×11): 2 mg via INTRAVENOUS
  Filled 2021-10-28 (×11): qty 1

## 2021-10-28 MED ORDER — ENOXAPARIN SODIUM 40 MG/0.4ML IJ SOSY
40.0000 mg | PREFILLED_SYRINGE | INTRAMUSCULAR | Status: DC
  Administered 2021-10-28 – 2021-10-30 (×3): 40 mg via SUBCUTANEOUS
  Filled 2021-10-28 (×3): qty 0.4

## 2021-10-28 MED ORDER — HYDROMORPHONE HCL 1 MG/ML IJ SOLN
1.0000 mg | INTRAMUSCULAR | Status: AC | PRN
  Administered 2021-10-28 (×3): 1 mg via INTRAVENOUS
  Filled 2021-10-28 (×3): qty 1

## 2021-10-28 MED ORDER — SENNOSIDES-DOCUSATE SODIUM 8.6-50 MG PO TABS
1.0000 | ORAL_TABLET | Freq: Two times a day (BID) | ORAL | Status: DC
Start: 2021-10-28 — End: 2021-10-30
  Administered 2021-10-28 – 2021-10-30 (×5): 1 via ORAL
  Filled 2021-10-28 (×5): qty 1

## 2021-10-28 MED ORDER — FOLIC ACID 1 MG PO TABS
1.0000 mg | ORAL_TABLET | Freq: Every day | ORAL | Status: DC
  Administered 2021-10-28 – 2021-10-30 (×3): 1 mg via ORAL
  Filled 2021-10-28 (×3): qty 1

## 2021-10-28 MED ORDER — ONDANSETRON HCL 4 MG PO TABS: 4.0000 mg | ORAL_TABLET | ORAL | Status: DC | PRN

## 2021-10-28 NOTE — ED Notes (Signed)
To Johann Capers, hospitalist, Ms Lindsey [name] at [facility] ED room 21 is here for sickle cell crisis. she is asking for more pain medication

## 2021-10-29 DIAGNOSIS — D638 Anemia in other chronic diseases classified elsewhere: Secondary | ICD-10-CM

## 2021-10-29 LAB — CBC
HCT: 22.7 % — ABNORMAL LOW (ref 36.0–46.0)
Hemoglobin: 8.4 g/dL — ABNORMAL LOW (ref 12.0–15.0)
MCH: 31.2 pg (ref 26.0–34.0)
MCHC: 37 g/dL — ABNORMAL HIGH (ref 30.0–36.0)
MCV: 84.4 fL (ref 80.0–100.0)
Platelets: 314 10*3/uL (ref 150–400)
RBC: 2.69 MIL/uL — ABNORMAL LOW (ref 3.87–5.11)
RDW: 14.6 % (ref 11.5–15.5)
WBC: 13.6 10*3/uL — ABNORMAL HIGH (ref 4.0–10.5)
nRBC: 0.2 % (ref 0.0–0.2)

## 2021-10-29 MED ORDER — DIPHENHYDRAMINE HCL 25 MG PO CAPS
25.0000 mg | ORAL_CAPSULE | Freq: Four times a day (QID) | ORAL | Status: DC | PRN
Start: 2021-10-29 — End: 2021-10-30

## 2021-10-29 NOTE — TOC Initial Note (Signed)
Transition of Care [facility]) - Initial/Assessment Note    Patient Details  Name: Lindsey [name] MRN: [id number] Date of Birth: 1971/05/07  [facility] CM/SW Contact:    Bartholome Bill, RN Phone Number: 10/29/2021, 1:06 PM  Clinical Narrative:                   Expected Discharge Plan: Home/Self Care Barriers to Discharge: Continued Medical Work up     Expected Discharge Plan and Services Expected Discharge Plan: Home/Self Care   Discharge Planning Services: CM Consult   Living arrangements for the past 2 months: Single Family Home                   Prior Living Arrangements/Services Living arrangements for the past 2 months: Single Family Home Lives with:: Spouse Patient language and need for interpreter reviewed:: Yes        Need for Family Participation in Patient Care: No (Comment) Care giver support system in place?: Yes (comment)   Criminal Activity/Legal Involvement Pertinent to Current Situation/Hospitalization: No - Comment as needed  Activities of Daily Living Home Assistive Devices/Equipment: None ADL Screening (condition at time of admission) Patient's cognitive ability adequate to safely complete daily activities?: Yes Is the patient deaf or have difficulty hearing?: No Does the patient have difficulty seeing, even when wearing glasses/contacts?: No Does the patient have difficulty concentrating, remembering, or making decisions?: No Patient able to express need for assistance with ADLs?: Yes Does the patient have difficulty dressing or bathing?: No Independently performs ADLs?: Yes (appropriate for developmental age) Does the patient have difficulty walking or climbing stairs?: No Weakness of Legs: None Weakness of Arms/Hands: None  Permission Sought/Granted                  Emotional Assessment Appearance:: Appears stated age     Orientation: : Oriented to Self, Oriented to Place, Oriented to  Time, Oriented to  Situation Alcohol / Substance Use: Not Applicable Psych Involvement: No (comment)  Admission diagnosis:  Vasoocclusive sickle cell crisis (HCC) [D57.00] Sickle cell pain crisis (HCC) [D57.00] Pain of right lower extremity [M79.604] Patient Active Problem List   Diagnosis Date Noted   Sickle cell anemia with crisis (HCC) 10/09/2021   Sickle cell pain crisis (HCC) 07/17/2021   Sickle cell crisis (HCC) 06/19/2021   Sickle cell anemia (HCC) 09/10/2019   Current mild episode of major depressive disorder (HCC)    Sickle cell disease (HCC) 10/01/2017   Chronic pain 10/01/2017   Chronic narcotic use 09/25/2017   PCP:  Massie Maroon, FNP Pharmacy:   [id number] - [place], [place] - [address] AT [facility] [address] [place] [address] Phone: [phone] Fax: [phone]  [id number] - [place], [place] - [address] AT [facility] [address]  [place] [address] Phone: [phone] Fax: [phone]  [id number] [place], [place] - [address] AT [facility] [address] & [facility] [address]  [place] [address] Phone: [phone] Fax: [phone]     Social Determinants of Health (SDOH) Interventions    Readmission Risk Interventions    10/29/2021    1:04 PM 07/18/2021    2:32 PM  Readmission Risk Prevention Plan  Transportation Screening Complete Complete  PCP or Specialist Appt within 3-5 Days Complete Complete  HRI or Home Care Consult Complete Complete  Social  Work Consult for Recovery Care Planning/Counseling Complete Complete  Palliative Care Screening Not Applicable Not Applicable  Medication Review Ocean[employer]) Complete Complete

## 2021-10-30 DIAGNOSIS — D638 Anemia in other chronic diseases classified elsewhere: Secondary | ICD-10-CM

## 2021-10-30 DIAGNOSIS — D5700 Hb-SS disease with crisis, unspecified: Principal | ICD-10-CM

## 2021-10-30 DIAGNOSIS — G894 Chronic pain syndrome: Secondary | ICD-10-CM

## 2021-10-30 LAB — CBC
HCT: 24.3 % — ABNORMAL LOW (ref 36.0–46.0)
Hemoglobin: 9 g/dL — ABNORMAL LOW (ref 12.0–15.0)
MCH: 31.1 pg (ref 26.0–34.0)
MCHC: 37 g/dL — ABNORMAL HIGH (ref 30.0–36.0)
MCV: 84.1 fL (ref 80.0–100.0)
Platelets: 334 10*3/uL (ref 150–400)
RBC: 2.89 MIL/uL — ABNORMAL LOW (ref 3.87–5.11)
RDW: 14.3 % (ref 11.5–15.5)
WBC: 9.4 10*3/uL (ref 4.0–10.5)
nRBC: 0.2 % (ref 0.0–0.2)

## 2021-10-30 MED ORDER — OXYCODONE HCL ER 10 MG PO T12A: 10.0000 mg | EXTENDED_RELEASE_TABLET | Freq: Two times a day (BID) | ORAL | 0 refills | Status: DC

## 2021-11-05 ENCOUNTER — Telehealth: Payer: Self-pay

## 2021-11-05 ENCOUNTER — Other Ambulatory Visit: Payer: Self-pay | Admitting: Family Medicine

## 2021-11-05 DIAGNOSIS — D5700 Hb-SS disease with crisis, unspecified: Secondary | ICD-10-CM

## 2021-11-05 DIAGNOSIS — G894 Chronic pain syndrome: Secondary | ICD-10-CM

## 2021-11-05 MED ORDER — OXYCODONE HCL 10 MG PO TABS: 10.0000 mg | ORAL_TABLET | Freq: Four times a day (QID) | ORAL | 0 refills | Status: DC | PRN

## 2021-11-05 NOTE — Progress Notes (Signed)
Reviewed PDMP substance reporting system prior to prescribing opiate medications. No inconsistencies noted.  Meds ordered this encounter  Medications   Oxycodone HCl 10 MG TABS    Sig: Take 1 tablet (10 mg total) by mouth every 6 (six) hours as needed (for moderate to severe pain).    Dispense:  60 tablet    Refill:  0    Order Specific Question:   Supervising Provider    Answer:   Quentin Angst [[id number]]   [name]  APRN, MSN, FNP-C Patient Care [facility] [address] [place], [place] [address] [phone]

## 2021-11-05 NOTE — Telephone Encounter (Signed)
Oxycodone 10mg

## 2021-11-10 ENCOUNTER — Other Ambulatory Visit: Payer: Self-pay | Admitting: Family Medicine

## 2021-11-10 ENCOUNTER — Other Ambulatory Visit: Payer: Self-pay

## 2021-11-10 DIAGNOSIS — G894 Chronic pain syndrome: Secondary | ICD-10-CM

## 2021-11-10 DIAGNOSIS — D5700 Hb-SS disease with crisis, unspecified: Secondary | ICD-10-CM

## 2021-11-10 MED ORDER — OXYCODONE HCL 10 MG PO TABS: 10.0000 mg | ORAL_TABLET | Freq: Four times a day (QID) | ORAL | 0 refills | Status: DC | PRN

## 2021-11-10 NOTE — Progress Notes (Signed)
Reviewed PDMP substance reporting system prior to prescribing opiate medications. No inconsistencies noted.  Meds ordered this encounter  Medications   Oxycodone HCl 10 MG TABS    Sig: Take 1 tablet (10 mg total) by mouth every 6 (six) hours as needed (for moderate to severe pain).    Dispense:  37 tablet    Refill:  0    Order Specific Question:   Supervising Provider    Answer:   Quentin Angst [[id number]]   [name]  APRN, MSN, FNP-C Patient Care [facility] [address] [place], [place] [address] [phone]

## 2021-11-11 ENCOUNTER — Other Ambulatory Visit: Payer: Self-pay | Admitting: Family Medicine

## 2021-11-15 NOTE — Telephone Encounter (Signed)
Error

## 2021-11-19 ENCOUNTER — Other Ambulatory Visit: Payer: Self-pay | Admitting: Family Medicine

## 2021-11-19 DIAGNOSIS — G894 Chronic pain syndrome: Secondary | ICD-10-CM

## 2021-11-19 DIAGNOSIS — D5700 Hb-SS disease with crisis, unspecified: Secondary | ICD-10-CM

## 2021-11-19 MED ORDER — OXYCODONE HCL 10 MG PO TABS: 10.0000 mg | ORAL_TABLET | Freq: Four times a day (QID) | ORAL | 0 refills | Status: DC | PRN

## 2021-11-19 NOTE — Progress Notes (Signed)
Reviewed PDMP substance reporting system prior to prescribing opiate medications. No inconsistencies noted.  Meds ordered this encounter  Medications   Oxycodone HCl 10 MG TABS    Sig: Take 1 tablet (10 mg total) by mouth every 6 (six) hours as needed for up to 15 days (for moderate to severe pain).    Dispense:  60 tablet    Refill:  0    Order Specific Question:   Supervising Provider    Answer:   [name], [name]   Sawsan Riggio Moore Norris Bodley  APRN, MSN, FNP-C Patient Care [facility] [address]  [place], [place] [address] [phone]  

## 2021-11-29 ENCOUNTER — Other Ambulatory Visit: Payer: Self-pay

## 2021-11-29 ENCOUNTER — Inpatient Hospital Stay (HOSPITAL_COMMUNITY)
Admission: EM | Admit: 2021-11-29 | Discharge: 2021-12-02 | DRG: 812 | Disposition: A | Discharge status: Home / Self Care | Payer: Medicaid Other | Attending: Internal Medicine | Admitting: Internal Medicine

## 2021-11-29 ENCOUNTER — Encounter (HOSPITAL_COMMUNITY): Payer: Self-pay

## 2021-11-29 DIAGNOSIS — F17210 Nicotine dependence, cigarettes, uncomplicated: Secondary | ICD-10-CM | POA: Diagnosis present

## 2021-11-29 DIAGNOSIS — G894 Chronic pain syndrome: Secondary | ICD-10-CM | POA: Diagnosis present

## 2021-11-29 DIAGNOSIS — Z79891 Long term (current) use of opiate analgesic: Secondary | ICD-10-CM

## 2021-11-29 DIAGNOSIS — F17290 Nicotine dependence, other tobacco product, uncomplicated: Secondary | ICD-10-CM | POA: Diagnosis present

## 2021-11-29 DIAGNOSIS — F1190 Opioid use, unspecified, uncomplicated: Secondary | ICD-10-CM | POA: Diagnosis present

## 2021-11-29 DIAGNOSIS — Z79899 Other long term (current) drug therapy: Secondary | ICD-10-CM

## 2021-11-29 DIAGNOSIS — Z884 Allergy status to anesthetic agent status: Secondary | ICD-10-CM

## 2021-11-29 DIAGNOSIS — F1121 Opioid dependence, in remission: Secondary | ICD-10-CM | POA: Diagnosis present

## 2021-11-29 DIAGNOSIS — D5700 Hb-SS disease with crisis, unspecified: Principal | ICD-10-CM | POA: Diagnosis present

## 2021-11-29 LAB — COMPREHENSIVE METABOLIC PANEL
ALT: 23 U/L (ref 0–44)
AST: 22 U/L (ref 15–41)
Albumin: 4.4 g/dL (ref 3.5–5.0)
Alkaline Phosphatase: 43 U/L (ref 38–126)
Anion gap: 8 (ref 5–15)
BUN: 11 mg/dL (ref 6–20)
CO2: 24 mmol/L (ref 22–32)
Calcium: 8.7 mg/dL — ABNORMAL LOW (ref 8.9–10.3)
Chloride: 109 mmol/L (ref 98–111)
Creatinine, Ser: 0.8 mg/dL (ref 0.44–1.00)
GFR, Estimated: >60 mL/min (ref >60)
Glucose, Bld: 93 mg/dL (ref 70–99)
Potassium: 3.7 mmol/L (ref 3.5–5.1)
Sodium: 141 mmol/L (ref 135–145)
Total Bilirubin: 1.1 mg/dL (ref 0.3–1.2)
Total Protein: 7.5 g/dL (ref 6.5–8.1)

## 2021-11-29 LAB — CBC WITH DIFFERENTIAL/PLATELET
Abs Immature Granulocytes: 0.02 10*3/uL (ref 0.00–0.07)
Basophils Absolute: 0.1 10*3/uL (ref 0.0–0.1)
Basophils Relative: 1 %
Eosinophils Absolute: 0.1 10*3/uL (ref 0.0–0.5)
Eosinophils Relative: 2 %
HCT: 28.7 % — ABNORMAL LOW (ref 36.0–46.0)
Hemoglobin: 10.7 g/dL — ABNORMAL LOW (ref 12.0–15.0)
Immature Granulocytes: 0 %
Lymphocytes Relative: 42 %
Lymphs Abs: 3.5 10*3/uL (ref 0.7–4.0)
MCH: 31 pg (ref 26.0–34.0)
MCHC: 37.3 g/dL — ABNORMAL HIGH (ref 30.0–36.0)
MCV: 83.2 fL (ref 80.0–100.0)
Monocytes Absolute: 0.6 10*3/uL (ref 0.1–1.0)
Monocytes Relative: 8 %
Neutro Abs: 3.9 10*3/uL (ref 1.7–7.7)
Neutrophils Relative %: 47 %
Platelets: 385 10*3/uL (ref 150–400)
RBC: 3.45 MIL/uL — ABNORMAL LOW (ref 3.87–5.11)
RDW: 15 % (ref 11.5–15.5)
WBC: 8.3 10*3/uL (ref 4.0–10.5)
nRBC: 0.4 % — ABNORMAL HIGH (ref 0.0–0.2)

## 2021-11-29 LAB — I-STAT BETA HCG BLOOD, ED (MC, WL, AP ONLY): I-stat hCG, quantitative: <5 m[IU]/mL (ref <5)

## 2021-11-29 LAB — RETICULOCYTES
Immature Retic Fract: 28.2 % — ABNORMAL HIGH (ref 2.3–15.9)
RBC.: 3.46 MIL/uL — ABNORMAL LOW (ref 3.87–5.11)
Retic Count, Absolute: 133.6 10*3/uL (ref 19.0–186.0)
Retic Ct Pct: 3.9 % — ABNORMAL HIGH (ref 0.4–3.1)

## 2021-11-29 MED ORDER — ONDANSETRON HCL 4 MG/2ML IJ SOLN
4.0000 mg | INTRAMUSCULAR | Status: DC | PRN
  Administered 2021-11-29: 4 mg via INTRAVENOUS

## 2021-11-29 MED ORDER — DIPHENHYDRAMINE HCL 25 MG PO CAPS
25.0000 mg | ORAL_CAPSULE | ORAL | Status: DC | PRN
  Administered 2021-11-29: 25 mg via ORAL

## 2021-11-29 MED ORDER — KETOROLAC TROMETHAMINE 30 MG/ML IJ SOLN
30.0000 mg | Freq: Once | INTRAMUSCULAR | Status: AC
Start: 2021-11-30 — End: 2021-11-30
  Administered 2021-11-30: 30 mg via INTRAVENOUS
  Filled 2021-11-29: qty 1

## 2021-11-29 MED ORDER — HYDROMORPHONE HCL 2 MG/ML IJ SOLN
INTRAMUSCULAR | Status: AC
  Filled 2021-11-29: qty 1

## 2021-11-29 MED ORDER — DIPHENHYDRAMINE HCL 25 MG PO CAPS
ORAL_CAPSULE | ORAL | Status: AC
  Filled 2021-11-29: qty 1

## 2021-11-29 MED ORDER — ONDANSETRON HCL 4 MG/2ML IJ SOLN
INTRAMUSCULAR | Status: AC
  Filled 2021-11-29: qty 2

## 2021-11-29 MED ORDER — HYDROMORPHONE HCL 2 MG/ML IJ SOLN
2.0000 mg | INTRAMUSCULAR | Status: AC
  Filled 2021-11-29: qty 1

## 2021-11-29 MED ORDER — LACTATED RINGERS IV BOLUS
1000.0000 mL | Freq: Once | INTRAVENOUS | Status: AC
  Administered 2021-11-30: 1000 mL via INTRAVENOUS

## 2021-11-29 MED ORDER — HYDROMORPHONE HCL 2 MG/ML IJ SOLN
INTRAMUSCULAR | Status: AC
  Administered 2021-11-30: 2 mg via INTRAVENOUS
  Filled 2021-11-29: qty 1

## 2021-11-29 MED ORDER — HYDROMORPHONE HCL 2 MG/ML IJ SOLN
2.0000 mg | INTRAMUSCULAR | Status: AC
  Administered 2021-11-29: 2 mg via INTRAVENOUS

## 2021-11-29 NOTE — ED Triage Notes (Signed)
Pt reports with [facility] since yesterday evening. Pt complains of pain to both arms and legs. Pt last took Oxycontin 10 mg around 4 pm.

## 2021-11-29 NOTE — ED Provider Notes (Signed)
[facility]   CSN: [id number] Arrival date & time: 11/29/21  2128     History {Add pertinent medical, surgical, social history, OB history to HPI:1} Chief Complaint  Patient presents with   Sickle Cell Pain Crisis    Lindsey [name] is a 51 y.o. female.  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  She states that her crisis began 2 nights ago.  She complains of arm pain and leg pain which is consistent with her prior crises.  She has no chest pain or shortness of breath.  She has been taking her home OxyContin and gabapentin with little relief.  Per chart review, no history of acute chest and has required admissions for pain management in the past.    Home Medications Prior to Admission medications   Medication Sig Start Date End Date Taking? Authorizing Provider  diphenhydrAMINE (BENADRYL ALLERGY) 25 MG tablet Take 50 mg by mouth at bedtime as needed for sleep.    [provider]  folic acid (FOLVITE) 1 MG tablet Take 1 tablet (1 mg total) by mouth daily. 03/30/21 03/30/22  Massie Maroon, FNP  gabapentin (NEURONTIN) 400 MG capsule TAKE 1 CAPSULE(400 MG) BY MOUTH THREE TIMES DAILY Patient taking differently: Take 400 mg by mouth 3 (three) times daily. 09/15/21   Massie Maroon, FNP  ibuprofen (ADVIL) 800 MG tablet Take 1 tablet (800 mg total) by mouth every 8 (eight) hours as needed. Patient taking differently: Take 800 mg by mouth every 8 (eight) hours as needed for moderate pain. 05/20/21   Massie Maroon, FNP  Melatonin 10 MG TABS Take 10 mg by mouth at bedtime as needed (for sleep).    [provider]  oxyCODONE (OXYCONTIN) 10 mg 12 hr tablet Take 1 tablet (10 mg total) by mouth every 12 (twelve) hours. 10/31/21 11/30/21  Quentin Angst, MD  Oxycodone HCl 10 MG TABS Take 1 tablet (10 mg total) by mouth every 6 (six)  hours as needed for up to 15 days (for moderate to severe pain). 11/19/21 12/04/21  Massie Maroon, FNP      Allergies    Ketamine    Review of Systems   Review of Systems Ten systems reviewed and are negative for acute change, except as noted in the HPI.   Physical Exam Updated Vital Signs BP 106/78   Pulse 81   Temp 98.8 F (37.1 C) (Oral)   Resp 19   Ht 5' (1.524 m)   Wt 63.5 kg   SpO2 97%   BMI 27.34 kg/m  Physical Exam Vitals and nursing note reviewed.  Constitutional:      General: She is not in acute distress.    Appearance: She is well-developed.     Comments: Tearful, moaning  HENT:     Head: Normocephalic and atraumatic.  Eyes:     Conjunctiva/sclera: Conjunctivae normal.  Cardiovascular:     Rate and Rhythm: Normal rate and regular rhythm.     Heart sounds: No murmur heard. Pulmonary:     Effort: Pulmonary effort is normal. No respiratory distress.     Breath sounds: Normal breath sounds.  Abdominal:     Palpations: Abdomen is soft.     Tenderness: There is no abdominal tenderness.  Musculoskeletal:        General: No swelling.     Cervical back: Neck supple.  Skin:  General: Skin is warm and dry.     Capillary Refill: Capillary refill takes less than 2 seconds.  Neurological:     Mental Status: She is alert.  Psychiatric:        Mood and Affect: Mood normal.     ED Results / Procedures / Treatments   Labs (all labs ordered are listed, but only abnormal results are displayed) Labs Reviewed  CBC WITH DIFFERENTIAL/PLATELET - Abnormal; Notable for the following components:      Result Value   RBC 3.45 (*)    Hemoglobin 10.7 (*)    HCT 28.7 (*)    MCHC 37.3 (*)    nRBC 0.4 (*)    All other components within normal limits  RETICULOCYTES - Abnormal; Notable for the following components:   Retic Ct Pct 3.9 (*)    RBC. 3.46 (*)    Immature Retic Fract 28.2 (*)    All other components within normal limits  COMPREHENSIVE METABOLIC PANEL -  Abnormal; Notable for the following components:   Calcium 8.7 (*)    All other components within normal limits  URINALYSIS, ROUTINE W REFLEX MICROSCOPIC  I-STAT BETA HCG BLOOD, ED ([facility], [facility], [facility] ONLY)    EKG None  Radiology No results found.  Procedures Procedures  {Document cardiac monitor, telemetry assessment procedure when appropriate:1}  Medications Ordered in ED Medications  HYDROmorphone (DILAUDID) injection 2 mg (has no administration in time range)  diphenhydrAMINE (BENADRYL) capsule 25-50 mg (25 mg Oral Given 11/29/21 2255)  ondansetron (ZOFRAN) injection 4 mg (4 mg Intravenous Given 11/29/21 2255)  lactated ringers bolus 1,000 mL (has no administration in time range)  HYDROmorphone (DILAUDID) 2 MG/ML injection (has no administration in time range)  HYDROmorphone (DILAUDID) injection 2 mg (2 mg Intravenous Given 11/29/21 2328)  HYDROmorphone (DILAUDID) injection 2 mg (2 mg Intravenous Given 11/29/21 2250)    ED Course/ Medical Decision Making/ A&P                           Medical Decision Making Amount and/or Complexity of Data Reviewed Labs: ordered.  Risk Prescription drug management.   Patient presenting with sickle cell crisis.  On arrival, vitals reassuring, afebrile, not tachycardic, tachypneic or hypoxic.  She is very tearful and is crying on exam.  Labs ordered, reviewed and interpreted by me.  CBC overall reassuring, CMP unremarkable.  Her retake count is 3.9, immature reticulocyte count of 28.2. {Document critical care time when appropriate:1} {Document review of labs and clinical decision tools ie heart score, Chads2Vasc2 etc:1}  {Document your independent review of radiology images, and any outside records:1} {Document your discussion with family members, caretakers, and with consultants:1} {Document social determinants of health affecting pt's care:1} {Document your decision making why or why not admission, treatments were needed:1} Final Clinical  Impression(s) / ED Diagnoses Final diagnoses:  None    Rx / DC Orders ED Discharge Orders     None

## 2021-11-30 DIAGNOSIS — G894 Chronic pain syndrome: Secondary | ICD-10-CM | POA: Diagnosis present

## 2021-11-30 DIAGNOSIS — D5700 Hb-SS disease with crisis, unspecified: Secondary | ICD-10-CM | POA: Insufficient documentation

## 2021-11-30 DIAGNOSIS — Z79899 Other long term (current) drug therapy: Secondary | ICD-10-CM | POA: Diagnosis not present

## 2021-11-30 DIAGNOSIS — F1121 Opioid dependence, in remission: Secondary | ICD-10-CM | POA: Diagnosis present

## 2021-11-30 DIAGNOSIS — Z79891 Long term (current) use of opiate analgesic: Secondary | ICD-10-CM | POA: Diagnosis not present

## 2021-11-30 DIAGNOSIS — Z884 Allergy status to anesthetic agent status: Secondary | ICD-10-CM | POA: Diagnosis not present

## 2021-11-30 DIAGNOSIS — F17290 Nicotine dependence, other tobacco product, uncomplicated: Secondary | ICD-10-CM | POA: Diagnosis present

## 2021-11-30 DIAGNOSIS — F17210 Nicotine dependence, cigarettes, uncomplicated: Secondary | ICD-10-CM | POA: Diagnosis present

## 2021-11-30 MED ORDER — DIPHENHYDRAMINE HCL 25 MG PO CAPS: 25.0000 mg | ORAL_CAPSULE | ORAL | Status: DC | PRN

## 2021-11-30 MED ORDER — POLYETHYLENE GLYCOL 3350 17 G PO PACK
17.0000 g | PACK | Freq: Every day | ORAL | Status: DC | PRN
Start: 2021-11-30 — End: 2021-12-02

## 2021-11-30 MED ORDER — SODIUM CHLORIDE 0.9% FLUSH
9.0000 mL | INTRAVENOUS | Status: DC | PRN
Start: 2021-11-30 — End: 2021-12-02

## 2021-11-30 MED ORDER — NALOXONE HCL 0.4 MG/ML IJ SOLN: 0.4000 mg | INTRAMUSCULAR | Status: DC | PRN

## 2021-11-30 MED ORDER — ENOXAPARIN SODIUM 40 MG/0.4ML IJ SOSY
40.0000 mg | PREFILLED_SYRINGE | INTRAMUSCULAR | Status: DC
Start: 2021-11-30 — End: 2021-12-02
  Administered 2021-11-30 – 2021-12-01 (×2): 40 mg via SUBCUTANEOUS
  Filled 2021-11-30 (×3): qty 0.4

## 2021-11-30 MED ORDER — HYDROMORPHONE 1 MG/ML IV SOLN
INTRAVENOUS | Status: DC
  Administered 2021-11-30: 5 mg via INTRAVENOUS
  Administered 2021-11-30: 4 mg via INTRAVENOUS
  Administered 2021-11-30 – 2021-12-01 (×2): 3 mg via INTRAVENOUS
  Administered 2021-12-02: 5.5 mg via INTRAVENOUS
  Administered 2021-12-02: 2.5 mg via INTRAVENOUS
  Administered 2021-12-02: 1.5 mg via INTRAVENOUS
  Filled 2021-11-30 (×2): qty 30

## 2021-11-30 MED ORDER — SODIUM CHLORIDE 0.45 % IV SOLN: INTRAVENOUS | Status: DC

## 2021-11-30 MED ORDER — KETOROLAC TROMETHAMINE 15 MG/ML IJ SOLN
15.0000 mg | Freq: Four times a day (QID) | INTRAMUSCULAR | Status: DC
  Administered 2021-11-30 – 2021-12-02 (×9): 15 mg via INTRAVENOUS
  Filled 2021-11-30 (×10): qty 1

## 2021-11-30 MED ORDER — ORAL CARE MOUTH RINSE
15.0000 mL | OROMUCOSAL | Status: DC | PRN
Start: 2021-11-30 — End: 2021-12-02

## 2021-11-30 MED ORDER — OXYCODONE HCL ER 10 MG PO T12A
10.0000 mg | EXTENDED_RELEASE_TABLET | Freq: Two times a day (BID) | ORAL | Status: DC
  Administered 2021-11-30 – 2021-12-02 (×6): 10 mg via ORAL
  Filled 2021-11-30 (×6): qty 1

## 2021-11-30 MED ORDER — HYDROMORPHONE HCL 1 MG/ML IJ SOLN
1.0000 mg | INTRAMUSCULAR | Status: DC | PRN
  Administered 2021-11-30 (×4): 1 mg via INTRAVENOUS
  Filled 2021-11-30 (×4): qty 1

## 2021-11-30 MED ORDER — ONDANSETRON HCL 4 MG/2ML IJ SOLN: 4.0000 mg | Freq: Four times a day (QID) | INTRAMUSCULAR | Status: DC | PRN

## 2021-11-30 MED ORDER — SENNOSIDES-DOCUSATE SODIUM 8.6-50 MG PO TABS
1.0000 | ORAL_TABLET | Freq: Two times a day (BID) | ORAL | Status: DC
Start: 2021-11-30 — End: 2021-12-02
  Administered 2021-11-30 – 2021-12-02 (×6): 1 via ORAL
  Filled 2021-11-30 (×6): qty 1

## 2021-11-30 NOTE — H&P (Signed)
History and Physical    Patient: Lindsey [name] QIH:[id number] DOB: 1971-01-13 DOA: 11/29/2021 DOS: the patient was seen and examined on 11/30/2021 PCP: Massie Maroon, FNP  Patient coming from: Home  Chief Complaint:  Chief Complaint  Patient presents with   Sickle Cell Pain Crisis   HPI: Lindsey [name] is a 51 y.o. female with medical history significant of sickle cell dz, chronic pain.  Pt in to ED with c/o sickle cell crisis.  Onset yesterday evening, not relieved by home pain meds.  Pain in both arms and legs, typical for sickle cell pain.  Pain persists in ED despite multiple rounds of IV dilaudid.   Review of Systems: As mentioned in the history of present illness. All other systems reviewed and are negative. Past Medical History:  Diagnosis Date   Acute kidney injury (HCC) 01/18/2021   Acute pain of left shoulder    Cough 06/27/2020   Hypokalemia 06/27/2020   Hypoxia    Leukocytosis 09/03/2016   Opiate abuse, episodic (HCC) 09/25/2017   Opioid dependence in remission (HCC)    Right leg pain    Sickle cell crisis [facility])    Past Surgical History:  Procedure Laterality Date   APPENDECTOMY     CESAREAN SECTION     OTHER SURGICAL HISTORY     c-section   Social History:  reports that she has been smoking cigars and cigarettes. She has never used smokeless tobacco. She reports current alcohol use. She reports that she does not use drugs.  Allergies  Allergen Reactions   Ketamine Anxiety    Tachycardia    Family History  Problem Relation Age of Onset   Stroke Neg Hx        none that she knows of    Seizures Neg Hx     Prior to Admission medications   Medication Sig Start Date End Date Taking? Authorizing Provider  diphenhydrAMINE (BENADRYL ALLERGY) 25 MG tablet Take 50 mg by mouth at bedtime as needed for sleep.   Yes [provider]  folic acid (FOLVITE) 1 MG tablet Take 1 tablet (1 mg total) by mouth daily. 03/30/21 03/30/22 Yes Massie Maroon, FNP  gabapentin (NEURONTIN) 400 MG capsule TAKE 1 CAPSULE(400 MG) BY MOUTH THREE TIMES DAILY Patient taking differently: Take 400 mg by mouth 3 (three) times daily. 09/15/21  Yes Massie Maroon, FNP  ibuprofen (ADVIL) 800 MG tablet Take 1 tablet (800 mg total) by mouth every 8 (eight) hours as needed. Patient taking differently: Take 800 mg by mouth every 8 (eight) hours as needed for moderate pain. 05/20/21  Yes Massie Maroon, FNP  Oxycodone HCl 10 MG TABS Take 1 tablet (10 mg total) by mouth every 6 (six) hours as needed for up to 15 days (for moderate to severe pain). 11/19/21 12/04/21 Yes Massie Maroon, FNP  oxyCODONE (OXYCONTIN) 10 mg 12 hr tablet Take 1 tablet (10 mg total) by mouth every 12 (twelve) hours. 10/31/21 11/30/21  Quentin Angst, MD    Physical Exam: Vitals:   11/29/21 2328 11/30/21 0000 11/30/21 0030 11/30/21 0133  BP: 106/78 114/69 105/74 115/80  Pulse: 81 88 84 95  Resp: 19 20 16 14   Temp:    98.3 F (36.8 C)  TempSrc:    Oral  SpO2: 97% 94% 95% 94%  Weight:      Height:       Constitutional: Tearful, uncomfortable Eyes: PERRL, lids and conjunctivae normal ENMT: Mucous membranes are  moist. Posterior pharynx clear of any exudate or lesions.Normal dentition.  Neck: normal, supple, no masses, no thyromegaly Respiratory: clear to auscultation bilaterally, no wheezing, no crackles. Normal respiratory effort. No accessory muscle use.  Cardiovascular: Regular rate and rhythm, no murmurs / rubs / gallops. No extremity edema. 2+ pedal pulses. No carotid bruits.  Abdomen: no tenderness, no masses palpated. No hepatosplenomegaly. Bowel sounds positive.  Musculoskeletal: no clubbing / cyanosis. No joint deformity upper and lower extremities. Good ROM, no contractures. Normal muscle tone.  Skin: no rashes, lesions, ulcers. No induration Neurologic: CN 2-12 grossly intact. Sensation intact, DTR normal. Strength 5/5 in all 4.  Psychiatric: Normal judgment  and insight. Alert and oriented x 3. Tearful mood.   Data Reviewed:       Latest Ref Rng & Units 11/29/2021   10:09 PM 10/30/2021    5:58 AM 10/29/2021    6:31 AM  CBC  WBC 4.0 - 10.5 K/uL 8.3  9.4  13.6   Hemoglobin 12.0 - 15.0 g/dL 43.1  9.0  8.4   Hematocrit 36.0 - 46.0 % 28.7  24.3  22.7   Platelets 150 - 400 K/uL 385  334  314    CMP     Component Value Date/Time   NA 141 11/29/2021 2209   NA 142 05/20/2021 1101   K 3.7 11/29/2021 2209   CL 109 11/29/2021 2209   CO2 24 11/29/2021 2209   GLUCOSE 93 11/29/2021 2209   BUN 11 11/29/2021 2209   BUN 10 05/20/2021 1101   CREATININE 0.80 11/29/2021 2209   CALCIUM 8.7 (L) 11/29/2021 2209   PROT 7.5 11/29/2021 2209   PROT 7.1 05/20/2021 1101   ALBUMIN 4.4 11/29/2021 2209   ALBUMIN 4.6 05/20/2021 1101   AST 22 11/29/2021 2209   ALT 23 11/29/2021 2209   ALKPHOS 43 11/29/2021 2209   BILITOT 1.1 11/29/2021 2209   BILITOT 1.0 05/20/2021 1101   GFRNONAA >60 11/29/2021 2209   GFRAA 77 01/07/2020 1127     Assessment and Plan: * Vasoocclusive sickle cell crisis (HCC) Sickle cell pain crisis. Sickle cell pathway Scheduled toradol Dilaudid PCA Cont home long acting oxycontin HGB 10.7 today appears near baseline      Advance Care Planning:   Code Status: Full Code   Consults: None  Family Communication: No family in room  Severity of Illness: The appropriate patient status for this patient is INPATIENT. Inpatient status is judged to be reasonable and necessary in order to provide the required intensity of service to ensure the patient's safety. The patient's presenting symptoms, physical exam findings, and initial radiographic and laboratory data in the context of their chronic comorbidities is felt to place them at high risk for further clinical deterioration. Furthermore, it is not anticipated that the patient will be medically stable for discharge from the hospital within 2 midnights of admission.   * I certify that at  the point of admission it is my clinical judgment that the patient will require inpatient hospital care spanning beyond 2 midnights from the point of admission due to high intensity of service, high risk for further deterioration and high frequency of surveillance required.*  Author: Hillary Bow., DO 11/30/2021 1:59 AM  For on call review [email or web address].

## 2021-11-30 NOTE — Assessment & Plan Note (Signed)
Sickle cell pain crisis. 1. Sickle cell pathway 2. Scheduled toradol 3. Dilaudid PCA 4. Cont home long acting oxycontin 5. HGB 10.7 today appears near baseline

## 2021-11-30 NOTE — TOC Progression Note (Signed)
Transition of Care [facility]) - Progression Note    Patient Details  Name: Lindsey [name] MRN: [id number] Date of Birth: 16-May-1970     Transition of Care [facility]) CM/SW Contact:    Amada Jupiter, LCSW Phone Number: 11/30/2021, 11:00 AM    Transition of Care Department [facility]) has reviewed patient and no TOC needs have been identified at this time. We will continue to monitor patient advancement through interdisciplinary progression rounds. If new patient transition needs arise, please place a TOC consult.          Expected Discharge Plan and Services                                                 Social Determinants of Health (SDOH) Interventions    Readmission Risk Interventions    11/30/2021   10:59 AM 10/29/2021    1:04 PM 07/18/2021    2:32 PM  Readmission Risk Prevention Plan  Transportation Screening Complete Complete Complete  PCP or Specialist Appt within 3-5 Days  Complete Complete  HRI or Home Care Consult  Complete Complete  Social Work Consult for Recovery Care Planning/Counseling  Complete Complete  Palliative Care Screening  Not Applicable Not Applicable  Medication Review [employer]) Complete Complete Complete  PCP or Specialist appointment within 3-5 days of discharge Complete    HRI or Home Care Consult Complete    SW Recovery Care/Counseling Consult Complete    Palliative Care Screening Not Applicable    Skilled Nursing Facility Not Applicable

## 2021-12-01 MED ORDER — ALPRAZOLAM 0.5 MG PO TABS
0.5000 mg | ORAL_TABLET | Freq: Once | ORAL | Status: AC
  Administered 2021-12-01: 0.5 mg via ORAL
  Filled 2021-12-01: qty 1

## 2021-12-01 NOTE — Progress Notes (Signed)
Subjective: Lindsey [name] is a 51 year old female with a medical history significant for sickle cell disease, chronic pain syndrome, opiate dependence and tolerance, and history of anemia of chronic disease was admitted for sickle cell pain crisis. Today, patient is very tearful and states "I am tired of being in pain".  She rates her pain as 8/10 primarily to low back and lower extremities.  She denies headache, chest pain, shortness of breath, urinary symptoms, nausea, vomiting, or diarrhea.  Objective:  Vital signs in last 24 hours:  Vitals:   12/01/21 0934 12/01/21 1151 12/01/21 1329 12/01/21 1606  BP: (!) 110/59  118/68   Pulse: 67  65   Resp: 16 15 16 17   Temp: 97.9 F (36.6 C)  98.4 F (36.9 C)   TempSrc: Oral  Oral   SpO2: 98% 100% 95% 100%  Weight:      Height:        Intake/Output from previous day:   Intake/Output Summary (Last 24 hours) at 12/01/2021 1647 Last data filed at 12/01/2021 0600 Gross per 24 hour  Intake 1927.72 ml  Output --  Net 1927.72 ml    Physical Exam: General: Alert, awake, oriented x3, in no acute distress.  HEENT: [place]/AT PEERL, EOMI Neck: Trachea midline,  no masses, no thyromegal,y no JVD, no carotid bruit OROPHARYNX:  Moist, No exudate/ erythema/lesions.  Heart: Regular rate and rhythm, without murmurs, rubs, gallops, PMI non-displaced, no heaves or thrills on palpation.  Lungs: Clear to auscultation, no wheezing or rhonchi noted. No increased vocal fremitus resonant to percussion  Abdomen: Soft, nontender, nondistended, positive bowel sounds, no masses no hepatosplenomegaly noted..  Neuro: No focal neurological deficits noted cranial nerves II through XII grossly intact. DTRs 2+ bilaterally upper and lower extremities. Strength 5 out of 5 in bilateral upper and lower extremities. Musculoskeletal: No warm swelling or erythema around joints, no spinal tenderness noted. Psychiatric: Patient alert and oriented x3, good insight and cognition,  good recent to remote recall. Lymph node survey: No cervical axillary or inguinal lymphadenopathy noted.  Lab Results:  Basic Metabolic Panel:    Component Value Date/Time   NA 141 11/29/2021 2209   NA 142 05/20/2021 1101   K 3.7 11/29/2021 2209   CL 109 11/29/2021 2209   CO2 24 11/29/2021 2209   BUN 11 11/29/2021 2209   BUN 10 05/20/2021 1101   CREATININE 0.80 11/29/2021 2209   GLUCOSE 93 11/29/2021 2209   CALCIUM 8.7 (L) 11/29/2021 2209   CBC:    Component Value Date/Time   WBC 8.3 11/29/2021 2209   HGB 10.7 (L) 11/29/2021 2209   HGB 11.0 (L) 05/20/2021 1101   HCT 28.7 (L) 11/29/2021 2209   HCT 31.3 (L) 05/20/2021 1101   PLT 385 11/29/2021 2209   PLT 509 (H) 05/20/2021 1101   MCV 83.2 11/29/2021 2209   MCV 88 05/20/2021 1101   NEUTROABS 3.9 11/29/2021 2209   NEUTROABS 5.4 05/20/2021 1101   LYMPHSABS 3.5 11/29/2021 2209   LYMPHSABS 2.5 05/20/2021 1101   MONOABS 0.6 11/29/2021 2209   EOSABS 0.1 11/29/2021 2209   EOSABS 0.3 05/20/2021 1101   BASOSABS 0.1 11/29/2021 2209   BASOSABS 0.1 05/20/2021 1101    No results found for this or any previous visit (from the past 240 hour(s)).  Studies/Results: No results found.  Medications: Scheduled Meds:  enoxaparin (LOVENOX) injection  40 mg Subcutaneous Q24H   HYDROmorphone   Intravenous Q4H   ketorolac  15 mg Intravenous Q6H   oxyCODONE  10 mg Oral Q12H   senna-docusate  1 tablet Oral BID   Continuous Infusions:  sodium chloride 10 mL/hr at 12/01/21 1121   PRN Meds:.diphenhydrAMINE, naloxone **AND** sodium chloride flush, ondansetron (ZOFRAN) IV, mouth rinse, polyethylene glycol  Consultants: none  Procedures: none  Antibiotics: none  Assessment/Plan: Principal Problem:   Vasoocclusive sickle cell crisis (HCC) Active Problems:   Chronic narcotic use  Sickle cell disease with pain crisis: Continue IV Dilaudid PCA Decrease IV fluids to KVO Toradol 15 mg IV every 6 hours Monitor vital signs very  closely, reevaluate pain scale regularly, and supplemental oxygen as needed  Chronic pain syndrome: Continue home medications  Anemia of chronic disease: Hemoglobin is stable and consistent with patient's baseline.  There is no clinical indication for blood transfusion at this time.  Continue to follow closely.   Code Status: Full Code Family Communication: N/A Disposition Plan: Not yet ready for discharge  Brahim Dolman Rennis Petty  APRN, MSN, FNP-C [facility] [address] [place], [place] [address] [phone]  If 7PM-7AM, please contact night-coverage.  12/01/2021, 4:47 PM  LOS: 1 day

## 2021-12-01 NOTE — Plan of Care (Signed)
  Problem: Safety: Goal: Ability to remain free from injury will improve Outcome: Progressing   Problem: Pain Managment: Goal: General experience of comfort will improve Outcome: Progressing   

## 2021-12-02 ENCOUNTER — Ambulatory Visit: Payer: Medicaid Other | Admitting: Family Medicine

## 2021-12-02 MED ORDER — OXYCODONE HCL 10 MG PO TABS: 10.0000 mg | ORAL_TABLET | Freq: Four times a day (QID) | ORAL | 0 refills | Status: DC | PRN

## 2021-12-02 MED ORDER — OXYCODONE HCL ER 10 MG PO T12A: 10.0000 mg | EXTENDED_RELEASE_TABLET | Freq: Two times a day (BID) | ORAL | 0 refills | Status: DC

## 2021-12-02 NOTE — Progress Notes (Signed)
Patient refused to reviewed dc instructions. Pt states has been deal with it for a long time and dont need it this time.

## 2021-12-02 NOTE — Plan of Care (Signed)
  Problem: Pain Managment: Goal: General experience of comfort will improve Outcome: Progressing   

## 2021-12-02 NOTE — Discharge Summary (Signed)
Physician Discharge Summary  Evita Merida TSV:[id number] DOB: March 28, 1971 DOA: 11/29/2021  PCP: Massie Maroon, FNP  Admit date: 11/29/2021  Discharge date: 12/02/2021  Discharge Diagnoses:  Principal Problem:   Vasoocclusive sickle cell crisis (HCC) Active Problems:   Chronic narcotic use   Discharge Condition: Stable  Disposition:  Pt is discharged home in good condition and is to follow up with Massie Maroon, FNP this week to have labs evaluated. Chee Luciann Gossett is instructed to increase activity slowly and balance with rest for the next few days, and use prescribed medication to complete treatment of pain  Diet: Regular Wt Readings from Last 3 Encounters:  11/29/21 63.5 kg  10/27/21 63.5 kg  10/09/21 63.9 kg    History of present illness:  Keyle Doby is a 51 year old female with a medical history significant for sickle cell disease, chronic pain syndrome, opiate dependence and tolerance, and anemia of chronic disease presented to the emergency department with complaints of sickle cell pain crisis.  The onset of crisis was yesterday evening, and pain was unrelieved by her home medications.  Patient takes both OxyContin and oxycodone at home.  Patient reports pain in upper and lower extremities that is typical for sickle cell pain crisis. Patient's pain persisted in ER despite multiple rounds of IV Dilaudid.  Hospital Course:   Sickle cell disease with pain crisis: Patient was admitted for sickle cell pain crisis and managed appropriately with IVF, IV Dilaudid via PCA and IV Toradol, as well as other adjunct therapies per sickle cell pain management protocols.  Patient's pain intensity decreased to 4/10 and she is requesting discharge home.  Patient will resume her home medications.  She is requesting home opiate medications that consist of OxyContin 10 mg every 12 hours #60, and oxycodone 10 mg every 6 hours as needed for severe breakthrough pain #60.   Medications were sent to patient's pharmacy.  PDMP reviewed prior to prescribing opiate medications, no inconsistencies noted. Patient is alert, oriented, and ambulating without assistance.  She is aware of all upcoming appointments.  Patient was therefore discharged home today in a hemodynamically stable condition.   Anique will follow-up with PCP within 1 week of this discharge. Ascencion was counseled extensively about nonpharmacologic means of pain management, patient verbalized understanding and was appreciative of  the care received during this admission.   We discussed the need for good hydration, monitoring of hydration status, avoidance of heat, cold, stress, and infection triggers. We discussed the need to be adherent with taking home medications. Patient was reminded of the need to seek medical attention immediately if any symptom of bleeding, anemia, or infection occurs.  Discharge Exam: Vitals:   12/02/21 0530 12/02/21 1123  BP: 102/71 122/71  Pulse: (!) 54 72  Resp: 17 20  Temp: 98.7 F (37.1 C) 99 F (37.2 C)  SpO2: 98% 99%   Vitals:   12/02/21 0159 12/02/21 0445 12/02/21 0530 12/02/21 1123  BP: (!) 108/59  102/71 122/71  Pulse: 63  (!) 54 72  Resp: 18 16 17 20   Temp: 98.7 F (37.1 C)  98.7 F (37.1 C) 99 F (37.2 C)  TempSrc: Oral  Oral Oral  SpO2: 97% 98% 98% 99%  Weight:      Height:        General appearance : Awake, alert, not in any distress. Speech Clear. Not toxic looking HEENT: Atraumatic and Normocephalic, pupils equally reactive to light and accomodation Neck: Supple, no JVD. No cervical lymphadenopathy.  Chest: Good air entry bilaterally, no added sounds  CVS: S1 S2 regular, no murmurs.  Abdomen: Bowel sounds present, Non tender and not distended with no gaurding, rigidity or rebound. Extremities: B/L Lower Ext shows no edema, both legs are warm to touch Neurology: Awake alert, and oriented X 3, CN II-XII intact, Non focal Skin: No  Rash  Discharge Instructions  Discharge Instructions     Discharge patient   Complete by: As directed    Discharge disposition: 01-Home or Self Care   Discharge patient date: 12/02/2021      Allergies as of 12/02/2021       Reactions   Ketamine Anxiety   Tachycardia        Medication List     TAKE these medications    Benadryl Allergy 25 MG tablet Generic drug: diphenhydrAMINE Take 50 mg by mouth at bedtime as needed for sleep.   folic acid 1 MG tablet Commonly known as: FOLVITE Take 1 tablet (1 mg total) by mouth daily.   gabapentin 400 MG capsule Commonly known as: NEURONTIN TAKE 1 CAPSULE(400 MG) BY MOUTH THREE TIMES DAILY What changed: See the new instructions.   ibuprofen 800 MG tablet Commonly known as: ADVIL Take 1 tablet (800 mg total) by mouth every 8 (eight) hours as needed. What changed: reasons to take this   oxyCODONE 10 mg 12 hr tablet Commonly known as: OXYCONTIN Take 1 tablet (10 mg total) by mouth every 12 (twelve) hours.   Oxycodone HCl 10 MG Tabs Take 1 tablet (10 mg total) by mouth every 6 (six) hours as needed for up to 15 days (for moderate to severe pain).        The results of significant diagnostics from this hospitalization (including imaging, microbiology, ancillary and laboratory) are listed below for reference.    Significant Diagnostic Studies: No results found.  Microbiology: No results found for this or any previous visit (from the past 240 hour(s)).   Labs: Basic Metabolic Panel: Recent Labs  Lab 11/29/21 2209  NA 141  K 3.7  CL 109  CO2 24  GLUCOSE 93  BUN 11  CREATININE 0.80  CALCIUM 8.7*   Liver Function Tests: Recent Labs  Lab 11/29/21 2209  AST 22  ALT 23  ALKPHOS 43  BILITOT 1.1  PROT 7.5  ALBUMIN 4.4   No results for input(s): "LIPASE", "AMYLASE" in the last 168 hours. No results for input(s): "AMMONIA" in the last 168 hours. CBC: Recent Labs  Lab 11/29/21 2209  WBC 8.3  NEUTROABS 3.9   HGB 10.7*  HCT 28.7*  MCV 83.2  PLT 385   Cardiac Enzymes: No results for input(s): "CKTOTAL", "CKMB", "CKMBINDEX", "TROPONINI" in the last 168 hours. BNP: Invalid input(s): "POCBNP" CBG: No results for input(s): "GLUCAP" in the last 168 hours.  Time coordinating discharge: 30 minutes  Signed:  [name]  APRN, MSN, FNP-C [facility] [address] [place], [place] [address] [phone]  [facility] 12/02/2021, 6:57 PM

## 2021-12-03 ENCOUNTER — Telehealth: Payer: Self-pay

## 2021-12-03 NOTE — Telephone Encounter (Signed)
Pt just called and stated that if she can get her OXY earlier? She said she spoke with you recently!  She also sent you a message on my Chart!

## 2021-12-14 ENCOUNTER — Ambulatory Visit (INDEPENDENT_AMBULATORY_CARE_PROVIDER_SITE_OTHER): Payer: Self-pay | Admitting: Family Medicine

## 2021-12-14 ENCOUNTER — Encounter: Payer: Self-pay | Admitting: Family Medicine

## 2021-12-14 VITALS — BP 117/76 | HR 77 | Temp 98.8°F | Ht 60.0 in | Wt 139.2 lb

## 2021-12-14 DIAGNOSIS — D5700 Hb-SS disease with crisis, unspecified: Secondary | ICD-10-CM

## 2021-12-14 DIAGNOSIS — G894 Chronic pain syndrome: Secondary | ICD-10-CM

## 2021-12-14 DIAGNOSIS — F419 Anxiety disorder, unspecified: Secondary | ICD-10-CM

## 2021-12-14 DIAGNOSIS — F320 Major depressive disorder, single episode, mild: Secondary | ICD-10-CM

## 2021-12-14 DIAGNOSIS — F32A Depression, unspecified: Secondary | ICD-10-CM

## 2021-12-14 DIAGNOSIS — F410 Panic disorder [episodic paroxysmal anxiety] without agoraphobia: Secondary | ICD-10-CM

## 2021-12-14 MED ORDER — ALPRAZOLAM 0.25 MG PO TABS: 0.2500 mg | ORAL_TABLET | Freq: Two times a day (BID) | ORAL | 0 refills | Status: DC | PRN

## 2021-12-14 MED ORDER — DULOXETINE HCL 20 MG PO CPEP: 20.0000 mg | ORAL_CAPSULE | Freq: Every day | ORAL | 11 refills | Status: DC

## 2021-12-14 MED ORDER — OXYCODONE HCL 10 MG PO TABS: 10.0000 mg | ORAL_TABLET | Freq: Four times a day (QID) | ORAL | 0 refills | Status: DC | PRN

## 2021-12-14 NOTE — Progress Notes (Signed)
[facility]     Subjective   Patient ID: Lindsey [name], female    DOB: 09-Dec-1970  Age: 51 y.o. MRN: [id number]  Chief Complaint  Patient presents with   Pain    Pt states she has pain in both legs for the past 3 day's. Pt states she feel like she is going to have a mental break down pt been feeling like this for the past 6 month's pt states she feels very anxious, depressed     Lindsey [name] is a 51 year old female with a medical history significant for sickle cell disease, chronic pain syndrome, opiate dependence and tolerance, history of anemia of chronic disease, anxiety, and depression presents for follow-up. Patient's primary complaint is pain in bilateral lower extremities for the past 3 days.  The pain is consistent with patient's previous sickle cell related pain.  She has been taking OxyContin and oxycodone without sustained relief.  Today, her pain intensity is 8/10.  She denies headache, chest pain, shortness of breath, urinary symptoms, nausea, vomiting, or diarrhea. Patient is also complaining of increased anxiety for the past several months.  She also reports constant worrying and having the feeling that "something bad is can happen".  She attributes current mental stress to specific stressors in her family.  Currently, Lindsey [name] does not have any suicidal or homicidal intentions/ideations.    Patient Active Problem List   Diagnosis Date Noted   Anxiety and depression 12/29/2021   Sickle cell pain crisis (HCC) 11/30/2021   Anemia of chronic disease 10/29/2021   Sickle cell anemia with crisis (HCC) 10/09/2021   Vasoocclusive sickle cell crisis (HCC) 07/17/2021   Sickle cell crisis (HCC) 06/19/2021   Pain of right lower extremity    Sickle cell anemia (HCC) 09/10/2019   Current mild episode of major depressive disorder (HCC)    Sickle cell disease (HCC) 10/01/2017   Chronic pain 10/01/2017   Chronic narcotic use  09/25/2017   Past Medical History:  Diagnosis Date   Acute kidney injury (HCC) 01/18/2021   Acute pain of left shoulder    Cough 06/27/2020   Hypokalemia 06/27/2020   Hypoxia    Leukocytosis 09/03/2016   Opiate abuse, episodic (HCC) 09/25/2017   Opioid dependence in remission (HCC)    Right leg pain    Sickle cell crisis (HCC)    Past Surgical History:  Procedure Laterality Date   APPENDECTOMY     CESAREAN SECTION     OTHER SURGICAL HISTORY     c-section   Social History   Tobacco Use   Smoking status: Some Days    Packs/day: 0.00    Types: Cigars, Cigarettes   Smokeless tobacco: Never  Vaping Use   Vaping Use: Former  Substance Use Topics   Alcohol use: Yes    Comment: occasionally   Drug use: No   Social History   Socioeconomic History   Marital status: Married    Spouse name: Not on file   Number of children: 2   Years of education: Not on file   Highest education level: Not on file  Occupational History   Not on file  Tobacco Use   Smoking status: Some Days    Packs/day: 0.00    Types: Cigars, Cigarettes   Smokeless tobacco: Never  Vaping Use   Vaping Use: Former  Substance and Sexual Activity   Alcohol use: Yes    Comment: occasionally   Drug use: No  Sexual activity: Yes    Birth control/protection: None  Other Topics Concern   Not on file  Social History Narrative   Lives at home with husband and son   Right handed   Social Determinants of Health   Financial Resource Strain: Not on file  Food Insecurity: Not on file  Transportation Needs: Not on file  Physical Activity: Not on file  Stress: Not on file  Social Connections: Not on file  Intimate Partner Violence: Not on file   Family Status  Relation Name Status   Mother HTN Alive   Father GSW Deceased   Neg Hx  (Not Specified)   Family History  Problem Relation Age of Onset   Stroke Neg Hx        none that she knows of    Seizures Neg Hx    Allergies  Allergen Reactions    Ketamine Anxiety    Tachycardia      Review of Systems  Constitutional: Negative.  Negative for chills and fever.  HENT: Negative.    Eyes: Negative.   Respiratory: Negative.    Cardiovascular: Negative.   Gastrointestinal: Negative.   Genitourinary: Negative.   Musculoskeletal:  Positive for back pain and joint pain.  Skin: Negative.   Neurological: Negative.   Psychiatric/Behavioral: Negative.        Objective:     BP 117/76 (BP Location: Right Arm, Patient Position: Sitting, Cuff Size: Normal)   Pulse 77   Temp 98.8 F (37.1 C)   Ht 5' (1.524 m)   Wt 139 lb 4 oz (63.2 kg)   SpO2 100%   BMI 27.20 kg/m  BP Readings from Last 3 Encounters:  01/03/22 102/62  12/31/21 120/79  12/14/21 117/76   Wt Readings from Last 3 Encounters:  12/31/21 140 lb (63.5 kg)  12/28/21 140 lb (63.5 kg)  12/14/21 139 lb 4 oz (63.2 kg)      Physical Exam Constitutional:      Appearance: Normal appearance.  Eyes:     Pupils: Pupils are equal, round, and reactive to light.  Cardiovascular:     Rate and Rhythm: Normal rate and regular rhythm.     Pulses: Normal pulses.  Pulmonary:     Effort: Pulmonary effort is normal.  Abdominal:     General: Bowel sounds are normal.  Neurological:     General: No focal deficit present.     Mental Status: She is alert. Mental status is at baseline.  Psychiatric:        Attention and Perception: She is attentive. She does not perceive auditory hallucinations.        Mood and Affect: Mood is anxious. Affect is tearful.        Thought Content: Thought content does not include homicidal or suicidal ideation.        Judgment: Judgment normal.      No results found for any visits on 12/14/21.  Last CBC Lab Results  Component Value Date   WBC 9.9 01/03/2022   HGB 9.7 (L) 01/03/2022   HCT 26.1 (L) 01/03/2022   MCV 83.7 01/03/2022   MCH 31.1 01/03/2022   RDW 15.7 (H) 01/03/2022   PLT 334 01/03/2022   Last metabolic panel Lab Results   Component Value Date   GLUCOSE 102 (H) 01/03/2022   NA 144 01/03/2022   K 4.1 01/03/2022   CL 112 (H) 01/03/2022   CO2 25 01/03/2022   BUN 7 01/03/2022   CREATININE 0.78 01/03/2022  GFRNONAA >60 01/03/2022   CALCIUM 9.5 01/03/2022   PHOS 3.9 01/01/2022   PROT 7.3 01/03/2022   ALBUMIN 4.1 01/03/2022   LABGLOB 2.5 05/20/2021   AGRATIO 1.8 05/20/2021   BILITOT 2.0 (H) 01/03/2022   ALKPHOS 45 01/03/2022   AST 34 01/03/2022   ALT 33 01/03/2022   ANIONGAP 7 01/03/2022   Last lipids Lab Results  Component Value Date   CHOL  07/25/2007    119        ATP III CLASSIFICATION:  <200     mg/dL   Desirable  169-678  mg/dL   Borderline High  >=938    mg/dL   High   HDL 33 (L) 02/22/5101   LDLCALC  07/25/2007    78        Total Cholesterol/HDL:CHD Risk Coronary Heart Disease Risk Table                     Men   Women  1/2 Average Risk   3.4   3.3   TRIG 38 07/25/2007   CHOLHDL 3.6 07/25/2007   Last hemoglobin A1c No results found for: "HGBA1C" Last thyroid functions Lab Results  Component Value Date   TSH 2.480 02/20/2019   Last vitamin D Lab Results  Component Value Date   VD25OH 25.2 (L) 05/20/2021   Last vitamin B12 and Folate No results found for: "VITAMINB12", "FOLATE"    The ASCVD Risk score (Arnett DK, et al., 2019) failed to calculate for the following reasons:   Cannot find a previous HDL lab   Cannot find a previous total cholesterol lab    Assessment & Plan:   Problem List Items Addressed This Visit       Other   Current mild episode of major depressive disorder (HCC) - Primary   Relevant Medications   DULoxetine (CYMBALTA) 20 MG capsule   ALPRAZolam (XANAX) 0.25 MG tablet   Chronic pain   Relevant Medications   DULoxetine (CYMBALTA) 20 MG capsule   Oxycodone HCl 10 MG TABS (Start on 12/18/2021)   Other Relevant Orders   [id number] 11+Oxyco+Alc+Crt-Bund   Other Visit Diagnoses     Anxiety and depression       Relevant Medications   DULoxetine  (CYMBALTA) 20 MG capsule   ALPRAZolam (XANAX) 0.25 MG tablet   Other Relevant Orders   Ambulatory referral to Psychiatry   Panic       Relevant Medications   DULoxetine (CYMBALTA) 20 MG capsule   ALPRAZolam (XANAX) 0.25 MG tablet   Sickle cell anemia with pain (HCC)       Relevant Medications   Oxycodone HCl 10 MG TABS (Start on 12/18/2021)   Other Relevant Orders   [id number] 11+Oxyco+Alc+Crt-Bund       Return in about 3 months (around 03/16/2022) for sickle cell anemia, chronic pain.  [name]  APRN, MSN, FNP-C [facility] [address] [place], [place] [address] [phone]

## 2021-12-14 NOTE — Patient Instructions (Signed)
Duloxetine Delayed-Release Capsules What is this medication? DULOXETINE (doo LOX e teen) treats depression, anxiety, fibromyalgia, and certain types of chronic pain such as nerve, bone, or joint pain. It increases the amount of serotonin and norepinephrine in the brain, hormones that help regulate mood and pain. It belongs to a group of medications called SNRIs. This medicine may be used for other purposes; ask your health care provider or pharmacist if you have questions. COMMON BRAND NAME(S): Cymbalta, Drizalma, Irenka What should I tell my care team before I take this medication? They need to know if you have any of these conditions: Bipolar disorder Glaucoma High blood pressure Kidney disease Liver disease Seizures Suicidal thoughts, plans or attempt; a previous suicide attempt by you or a family member Take medications that treat or prevent blood clots Taken medications called MAOIs like Carbex, Eldepryl, Marplan, Nardil, and Parnate within 14 days Trouble passing urine An unusual reaction to duloxetine, other medications, foods, dyes, or preservatives Pregnant or trying to get pregnant Breast-feeding How should I use this medication? Take this medication by mouth with a glass of water. Follow the directions on the prescription label. Do not crush, cut or chew some capsules of this medication. Some capsules may be opened and sprinkled on applesauce. Check with your care team or pharmacist if you are not sure. You can take this medication with or without food. Take your medication at regular intervals. Do not take your medication more often than directed. Do not stop taking this medication suddenly except upon the advice of your care team. Stopping this medication too quickly may cause serious side effects or your condition may worsen. A special MedGuide will be given to you by the pharmacist with each prescription and refill. Be sure to read this information carefully each time. Talk to  your care team regarding the use of this medication in children. While this medication may be prescribed for children as young as 7 years of age for selected conditions, precautions do apply. Overdosage: If you think you have taken too much of this medicine contact a poison control center or emergency room at once. NOTE: This medicine is only for you. Do not share this medicine with others. What if I miss a dose? If you miss a dose, take it as soon as you can. If it is almost time for your next dose, take only that dose. Do not take double or extra doses. What may interact with this medication? Do not take this medication with any of the following: Desvenlafaxine Levomilnacipran Linezolid MAOIs like Carbex, Eldepryl, Emsam, Marplan, Nardil, and Parnate Methylene blue (injected into a vein) Milnacipran Safinamide Thioridazine Venlafaxine Viloxazine This medication may also interact with the following: Alcohol Amphetamines Aspirin and aspirin-like medications Certain antibiotics like ciprofloxacin and enoxacin Certain medications for blood pressure, heart disease, irregular heart beat Certain medications for depression, anxiety, or psychotic disturbances Certain medications for migraine headache like almotriptan, eletriptan, frovatriptan, naratriptan, rizatriptan, sumatriptan, zolmitriptan Certain medications that treat or prevent blood clots like warfarin, enoxaparin, and dalteparin Cimetidine Fentanyl Lithium NSAIDS, medications for pain and inflammation, like ibuprofen or naproxen Phentermine Procarbazine Rasagiline Sibutramine St. John's wort Theophylline Tramadol Tryptophan This list may not describe all possible interactions. Give your health care provider a list of all the medicines, herbs, non-prescription drugs, or dietary supplements you use. Also tell them if you smoke, drink alcohol, or use illegal drugs. Some items may interact with your medicine. What should I watch  for while using this medication? Tell your   care team if your symptoms do not get better or if they get worse. Visit your care team for regular checks on your progress. Because it may take several weeks to see the full effects of this medication, it is important to continue your treatment as prescribed by your care team. This medication may cause serious skin reactions. They can happen weeks to months after starting the medication. Contact your care team right away if you notice fevers or flu-like symptoms with a rash. The rash may be red or purple and then turn into blisters or peeling of the skin. Or, you might notice a red rash with swelling of the face, lips, or lymph nodes in your neck or under your arms. Watch for new or worsening thoughts of suicide or depression. This includes sudden changes in mood, behaviors, or thoughts. These changes can happen at any time but are more common in the beginning of treatment or after a change in dose. Call your care team right away if you experience these thoughts or worsening depression. Manic episodes may happen in patients with bipolar disorder who take this medication. Watch for changes in feelings or behaviors such as feeling anxious, nervous, agitated, panicky, irritable, hostile, aggressive, impulsive, severely restless, overly excited and hyperactive, or trouble sleeping. These symptoms can happen at any time, but are more common in the beginning of treatment or after a change in dose. Call your care team right away if you notice any of these symptoms. You may get drowsy or dizzy. Do not drive, use machinery, or do anything that needs mental alertness until you know how this medication affects you. Do not stand or sit up quickly, especially if you are an older patient. This reduces the risk of dizzy or fainting spells. Alcohol may interfere with the effect of this medication. Avoid alcoholic drinks. This medication may increase blood sugar. The risk may be  higher in patients who already have diabetes. Ask your care team what you can do to lower your risk of diabetes while taking this medication. This medication can cause an increase in blood pressure. This medication can also cause a sudden drop in your blood pressure, which may make you feel faint and increase the chance of a fall. These effects are most common when you first start the medication or when the dose is increased, or during use of other medications that can cause a sudden drop in blood pressure. Check with your care team for instructions on monitoring your blood pressure while taking this medication. Your mouth may get dry. Chewing sugarless gum or sucking hard candy, and drinking plenty of water, may help. Contact your care team if the problem does not go away or is severe. What side effects may I notice from receiving this medication? Side effects that you should report to your care team as soon as possible: Allergic reactions--skin rash, itching, hives, swelling of the face, lips, tongue, or throat Bleeding--bloody or black, tar-like stools, red or dark brown urine, vomiting blood or brown material that looks like coffee grounds, small, red or purple spots on skin, unusual bleeding or bruising Increase in blood pressure Liver injury--right upper belly pain, loss of appetite, nausea, light-colored stool, dark yellow or brown urine, yellowing skin or eyes, unusual weakness or fatigue Low sodium level--muscle weakness, fatigue, dizziness, headache, confusion Redness, blistering, peeling, or loosening of the skin, including inside the mouth Serotonin syndrome--irritability, confusion, fast or irregular heartbeat, muscle stiffness, twitching muscles, sweating, high fever, seizures, chills, vomiting, diarrhea   Sudden eye pain or change in vision such as blurry vision, seeing halos around lights, vision loss Thoughts of suicide or self-harm, worsening mood, feelings of depression Trouble passing  urine Side effects that usually do not require medical attention (report to your care team if they continue or are bothersome): Change in sex drive or performance Constipation Diarrhea Dizziness Dry mouth Excessive sweating Loss of appetite Nausea Vomiting This list may not describe all possible side effects. Call your doctor for medical advice about side effects. You may report side effects to FDA at 1-800-FDA-1088. Where should I keep my medication? Keep out of the reach of children and pets. Store at room temperature between 15 and 30 degrees C (59 to 86 degrees F). Get rid of any unused medication after the expiration date. To get rid of medications that are no longer needed or have expired: Take the medication to a medication take-back program. Check with your pharmacy or law enforcement to find a location. If you cannot return the medication, check the label or package insert to see if the medication should be thrown out in the garbage or flushed down the toilet. If you are not sure, ask your care team. If it is safe to put it in the trash, take the medication out of the container. Mix the medication with cat litter, dirt, coffee grounds, or other unwanted substance. Seal the mixture in a bag or container. Put it in the trash. NOTE: This sheet is a summary. It may not cover all possible information. If you have questions about this medicine, talk to your doctor, pharmacist, or health care provider.  2023 Elsevier/Gold Standard (2020-05-05 00:00:00)  

## 2021-12-19 LAB — DRUG SCREEN 764883 11+OXYCO+ALC+CRT-BUND
Amphetamines, Urine: NEGATIVE ng/mL
BENZODIAZ UR QL: NEGATIVE ng/mL
Barbiturate: NEGATIVE ng/mL
Cannabinoid Quant, Ur: NEGATIVE ng/mL
Cocaine (Metabolite): NEGATIVE ng/mL
Creatinine: 91.4 mg/dL (ref 20.0–300.0)
Ethanol: NEGATIVE %
Meperidine: NEGATIVE ng/mL
Methadone Screen, Urine: NEGATIVE ng/mL
Phencyclidine: NEGATIVE ng/mL
Propoxyphene: NEGATIVE ng/mL
Tramadol: NEGATIVE ng/mL
pH, Urine: 5.1 (ref 4.5–8.9)

## 2021-12-19 LAB — OXYCODONE/OXYMORPHONE, CONFIRM
OXYCODONE/OXYMORPH: POSITIVE — AB
OXYCODONE: 476 ng/mL
OXYCODONE: POSITIVE — AB
OXYMORPHONE (GC/MS): 2951 ng/mL
OXYMORPHONE: POSITIVE — AB

## 2021-12-19 LAB — OPIATES CONFIRMATION, URINE: Opiates: NEGATIVE ng/mL

## 2021-12-20 ENCOUNTER — Telehealth: Payer: Self-pay

## 2021-12-20 NOTE — Telephone Encounter (Signed)
PA approved  Confirmation #:[id number] WBenefit Plan:SICKLE CELLHealth Plan: Prior Approval #:[id number] PA Type:Iona FORMULRecipient:Lindsey DAVISRecipient IH:[id number] TBilling Provider:[facility] Provider OV:[id number] Requesting Provider Name:[facility] [place] [facility]ITALRequesting Provider AC:[id number] Submission Date:08/14/2023Status:APPROVEDEffective Begin Date:08/08/2023Effective End Date:12/14/2021

## 2021-12-20 NOTE — Telephone Encounter (Signed)
PA started for Alprazolam 0.25mg , Duloxetine DR 20 MG waiting on insurance approval. Confirmation #:[id number]

## 2021-12-28 ENCOUNTER — Other Ambulatory Visit: Payer: Self-pay

## 2021-12-28 ENCOUNTER — Encounter (HOSPITAL_COMMUNITY): Payer: Self-pay

## 2021-12-28 ENCOUNTER — Inpatient Hospital Stay (HOSPITAL_COMMUNITY)
Admission: EM | Admit: 2021-12-28 | Discharge: 2021-12-31 | DRG: 812 | Disposition: A | Discharge status: Home / Self Care | Payer: Medicaid Other | Attending: Internal Medicine | Admitting: Internal Medicine

## 2021-12-28 ENCOUNTER — Telehealth: Payer: Self-pay

## 2021-12-28 DIAGNOSIS — D5700 Hb-SS disease with crisis, unspecified: Principal | ICD-10-CM | POA: Diagnosis present

## 2021-12-28 DIAGNOSIS — G894 Chronic pain syndrome: Secondary | ICD-10-CM | POA: Diagnosis present

## 2021-12-28 DIAGNOSIS — D638 Anemia in other chronic diseases classified elsewhere: Secondary | ICD-10-CM | POA: Diagnosis present

## 2021-12-28 DIAGNOSIS — F410 Panic disorder [episodic paroxysmal anxiety] without agoraphobia: Secondary | ICD-10-CM

## 2021-12-28 DIAGNOSIS — F17290 Nicotine dependence, other tobacco product, uncomplicated: Secondary | ICD-10-CM | POA: Diagnosis present

## 2021-12-28 DIAGNOSIS — F17210 Nicotine dependence, cigarettes, uncomplicated: Secondary | ICD-10-CM | POA: Diagnosis present

## 2021-12-28 DIAGNOSIS — F32A Depression, unspecified: Secondary | ICD-10-CM

## 2021-12-28 DIAGNOSIS — G8929 Other chronic pain: Secondary | ICD-10-CM | POA: Diagnosis present

## 2021-12-28 DIAGNOSIS — F419 Anxiety disorder, unspecified: Secondary | ICD-10-CM | POA: Diagnosis present

## 2021-12-28 DIAGNOSIS — Z20822 Contact with and (suspected) exposure to covid-19: Secondary | ICD-10-CM | POA: Diagnosis present

## 2021-12-28 DIAGNOSIS — F320 Major depressive disorder, single episode, mild: Secondary | ICD-10-CM

## 2021-12-28 LAB — CBC WITH DIFFERENTIAL/PLATELET
Abs Immature Granulocytes: 0.02 10*3/uL (ref 0.00–0.07)
Basophils Absolute: 0.1 10*3/uL (ref 0.0–0.1)
Basophils Relative: 1 %
Eosinophils Absolute: 0.2 10*3/uL (ref 0.0–0.5)
Eosinophils Relative: 2 %
HCT: 29 % — ABNORMAL LOW (ref 36.0–46.0)
Hemoglobin: 10.8 g/dL — ABNORMAL LOW (ref 12.0–15.0)
Immature Granulocytes: 0 %
Lymphocytes Relative: 37 %
Lymphs Abs: 3.7 10*3/uL (ref 0.7–4.0)
MCH: 30.9 pg (ref 26.0–34.0)
MCHC: 37.2 g/dL — ABNORMAL HIGH (ref 30.0–36.0)
MCV: 82.9 fL (ref 80.0–100.0)
Monocytes Absolute: 0.7 10*3/uL (ref 0.1–1.0)
Monocytes Relative: 7 %
Neutro Abs: 5.2 10*3/uL (ref 1.7–7.7)
Neutrophils Relative %: 53 %
Platelets: 429 10*3/uL — ABNORMAL HIGH (ref 150–400)
RBC: 3.5 MIL/uL — ABNORMAL LOW (ref 3.87–5.11)
RDW: 15.1 % (ref 11.5–15.5)
WBC: 9.9 10*3/uL (ref 4.0–10.5)
nRBC: 0 % (ref 0.0–0.2)

## 2021-12-28 LAB — COMPREHENSIVE METABOLIC PANEL
ALT: 13 U/L (ref 0–44)
AST: 16 U/L (ref 15–41)
Albumin: 4.4 g/dL (ref 3.5–5.0)
Alkaline Phosphatase: 45 U/L (ref 38–126)
Anion gap: 6 (ref 5–15)
BUN: 10 mg/dL (ref 6–20)
CO2: 25 mmol/L (ref 22–32)
Calcium: 9.3 mg/dL (ref 8.9–10.3)
Chloride: 111 mmol/L (ref 98–111)
Creatinine, Ser: 0.9 mg/dL (ref 0.44–1.00)
GFR, Estimated: >60 mL/min (ref >60)
Glucose, Bld: 101 mg/dL — ABNORMAL HIGH (ref 70–99)
Potassium: 3.7 mmol/L (ref 3.5–5.1)
Sodium: 142 mmol/L (ref 135–145)
Total Bilirubin: 1.3 mg/dL — ABNORMAL HIGH (ref 0.3–1.2)
Total Protein: 7.5 g/dL (ref 6.5–8.1)

## 2021-12-28 LAB — RETICULOCYTES
Immature Retic Fract: 23.4 % — ABNORMAL HIGH (ref 2.3–15.9)
RBC.: 3.47 MIL/uL — ABNORMAL LOW (ref 3.87–5.11)
Retic Count, Absolute: 108.3 10*3/uL (ref 19.0–186.0)
Retic Ct Pct: 3.1 % (ref 0.4–3.1)

## 2021-12-28 MED ORDER — HYDROMORPHONE HCL 2 MG/ML IJ SOLN
2.0000 mg | INTRAMUSCULAR | Status: AC
  Administered 2021-12-28: 2 mg via INTRAVENOUS
  Filled 2021-12-28: qty 1

## 2021-12-28 MED ORDER — SODIUM CHLORIDE 0.9 % IV SOLN
12.5000 mg | Freq: Once | INTRAVENOUS | Status: AC
  Administered 2021-12-28: 12.5 mg via INTRAVENOUS
  Filled 2021-12-28: qty 12.5

## 2021-12-28 MED ORDER — DEXTROSE-NACL 5-0.45 % IV SOLN
INTRAVENOUS | Status: DC
  Administered 2021-12-28: 100 mL/h via INTRAVENOUS

## 2021-12-28 MED ORDER — OXYCODONE HCL 5 MG PO TABS
10.0000 mg | ORAL_TABLET | Freq: Once | ORAL | Status: AC
  Administered 2021-12-28: 10 mg via ORAL
  Filled 2021-12-28: qty 2

## 2021-12-28 MED ORDER — ONDANSETRON HCL 4 MG/2ML IJ SOLN
4.0000 mg | INTRAMUSCULAR | Status: DC | PRN
  Administered 2021-12-28: 4 mg via INTRAVENOUS
  Filled 2021-12-28: qty 2

## 2021-12-28 MED ORDER — HYDROMORPHONE HCL 2 MG/ML IJ SOLN
2.0000 mg | Freq: Once | INTRAMUSCULAR | Status: AC
  Administered 2021-12-28: 2 mg via INTRAVENOUS
  Filled 2021-12-28: qty 1

## 2021-12-28 NOTE — ED Provider Notes (Signed)
[facility]  HOSPITAL-EMERGENCY DEPT Provider Note   CSN: [id number] Arrival date & time: 12/28/21  1913     History  Chief Complaint  Patient presents with   Sickle Cell Pain Crisis    Oneita Hurt Avenly Roberge is a 51 y.o. female.  Patient is a 51 year old female with a history of sickle cell disease, opioid dependence and frequent sickle cell pain crises who is presenting today with pain in her lower back that has been present for the last 3 days.  She has been temporizing with her home medications and even taking double but reports the pain is unrelenting.  She is having episodes of emesis today related with the pain and is heaving in the trash can currently.  She denies any chest pain, shortness of breath, cough or fever.  She has no abdominal pain, diarrhea or urinary symptoms.  She reports this feels similar to her prior crisis that has caused back pain.  She denies any pain radiating down into her legs, joint swelling or pain.  She last took 10 mg of oxycodone 3 hours ago without any improvement.  The history is provided by the patient and medical records.  Sickle Cell Pain Crisis      Home Medications Prior to Admission medications   Medication Sig Start Date End Date Taking? Authorizing Provider  ALPRAZolam (XANAX) 0.25 MG tablet Take 1 tablet (0.25 mg total) by mouth 2 (two) times daily as needed for anxiety. 12/14/21   Massie Maroon, FNP  diphenhydrAMINE (BENADRYL ALLERGY) 25 MG tablet Take 50 mg by mouth at bedtime as needed for sleep.    [provider]  DULoxetine (CYMBALTA) 20 MG capsule Take 1 capsule (20 mg total) by mouth daily. 12/14/21 12/14/22  Massie Maroon, FNP  folic acid (FOLVITE) 1 MG tablet Take 1 tablet (1 mg total) by mouth daily. Patient not taking: Reported on 12/14/2021 03/30/21 03/30/22  Massie Maroon, FNP  gabapentin (NEURONTIN) 400 MG capsule TAKE 1 CAPSULE(400 MG) BY MOUTH THREE TIMES DAILY Patient taking differently: Take 400  mg by mouth 3 (three) times daily. 09/15/21   Massie Maroon, FNP  ibuprofen (ADVIL) 800 MG tablet Take 1 tablet (800 mg total) by mouth every 8 (eight) hours as needed. Patient taking differently: Take 800 mg by mouth every 8 (eight) hours as needed for moderate pain. 05/20/21   Massie Maroon, FNP  oxyCODONE (OXYCONTIN) 10 mg 12 hr tablet Take 1 tablet (10 mg total) by mouth every 12 (twelve) hours. 12/02/21 01/01/22  Massie Maroon, FNP  Oxycodone HCl 10 MG TABS Take 1 tablet (10 mg total) by mouth every 6 (six) hours as needed for up to 15 days (for moderate to severe pain). 12/18/21 01/02/22  Massie Maroon, FNP      Allergies    Ketamine    Review of Systems   Review of Systems  Physical Exam Updated Vital Signs BP (!) 125/90   Pulse 90   Temp 98.6 F (37 C) (Oral)   Resp 13   Ht 5' (1.524 m)   Wt 63.5 kg   SpO2 94%   BMI 27.34 kg/m  Physical Exam Vitals and nursing note reviewed.  Constitutional:      General: She is in acute distress.     Appearance: She is well-developed.     Comments: Retching over the trash can and appears to be in pain  HENT:     Head: Normocephalic and atraumatic.  Mouth/Throat:     Mouth: Mucous membranes are moist.  Eyes:     Pupils: Pupils are equal, round, and reactive to light.  Cardiovascular:     Rate and Rhythm: Regular rhythm. Tachycardia present.     Heart sounds: Normal heart sounds. No murmur heard.    No friction rub.  Pulmonary:     Effort: Pulmonary effort is normal.     Breath sounds: Normal breath sounds. No wheezing or rales.  Abdominal:     General: Bowel sounds are normal. There is no distension.     Palpations: Abdomen is soft.     Tenderness: There is no abdominal tenderness. There is no guarding or rebound.  Musculoskeletal:        General: No tenderness. Normal range of motion.     Comments: No edema.  Lumbar paraspinal tenderness bilaterally.  No point tenderness over the spine  Skin:    General: Skin  is warm and dry.     Findings: No rash.  Neurological:     Mental Status: She is alert and oriented to person, place, and time.     Cranial Nerves: No cranial nerve deficit.  Psychiatric:        Behavior: Behavior normal.     ED Results / Procedures / Treatments   Labs (all labs ordered are listed, but only abnormal results are displayed) Labs Reviewed  CBC WITH DIFFERENTIAL/PLATELET - Abnormal; Notable for the following components:      Result Value   RBC 3.50 (*)    Hemoglobin 10.8 (*)    HCT 29.0 (*)    MCHC 37.2 (*)    Platelets 429 (*)    All other components within normal limits  RETICULOCYTES - Abnormal; Notable for the following components:   RBC. 3.47 (*)    Immature Retic Fract 23.4 (*)    All other components within normal limits  COMPREHENSIVE METABOLIC PANEL - Abnormal; Notable for the following components:   Glucose, Bld 101 (*)    Total Bilirubin 1.3 (*)    All other components within normal limits    EKG None  Radiology No results found.  Procedures Procedures    Medications Ordered in ED Medications  dextrose 5 %-0.45 % sodium chloride infusion (100 mL/hr Intravenous New Bag/Given 12/28/21 2014)  ondansetron (ZOFRAN) injection 4 mg (4 mg Intravenous Given 12/28/21 2008)  oxyCODONE (Oxy IR/ROXICODONE) immediate release tablet 10 mg (has no administration in time range)  HYDROmorphone (DILAUDID) injection 2 mg (has no administration in time range)  HYDROmorphone (DILAUDID) injection 2 mg (2 mg Intravenous Given 12/28/21 2008)  HYDROmorphone (DILAUDID) injection 2 mg (2 mg Intravenous Given 12/28/21 2125)  HYDROmorphone (DILAUDID) injection 2 mg (2 mg Intravenous Given 12/28/21 2217)  diphenhydrAMINE (BENADRYL) 12.5 mg in sodium chloride 0.9 % 50 mL IVPB (0 mg Intravenous Stopped 12/28/21 2125)    ED Course/ Medical Decision Making/ A&P                           Medical Decision Making Amount and/or Complexity of Data Reviewed External Data Reviewed:  notes.    Details: hospitalization Labs: ordered. Decision-making details documented in ED Course.  Risk Prescription drug management. Decision regarding hospitalization.   Pt with multiple medical problems and comorbidities and presenting today with a complaint that caries a high risk for morbidity and mortality.  Here today complaining of sickle cell pain crisis.  Is having lower back pain for the  last 3 days which is worsened and is not being controlled by her home medications.  She last took oxycodone 10 mg 3 hours ago but reports the pain is unrelenting.  She has been having emesis as well related to the pain.  She denies any urinary symptoms, change in her medications.  She denies any infectious symptoms.  No chest pain or shortness of breath.  Will start pain medication for her pain.  Labs are pending.  11:07 PM On reevaluation patient has received 3 rounds of IV medication and is still having 9 out of 10 pain.  The vomiting has stopped.  I independently interpreted patient's labs today and CBC is at baseline with a hemoglobin of 10, normal platelet count and white count, reticulocyte site count without significant changes, CMP without acute findings.  Given patient's ongoing pain despite her home medications and 3 rounds of medications here will admit for further care and PCA with pain control.         Final Clinical Impression(s) / ED Diagnoses Final diagnoses:  Sickle cell pain crisis [facility])    Rx / DC Orders ED Discharge Orders     None         Gwyneth Sprout, MD 12/28/21 2307

## 2021-12-28 NOTE — ED Triage Notes (Signed)
Pt reports with [facility] x 3 days. Pt complains of lower back pain and reports last taking Oxycodone 10 mg x 3 hrs ago.

## 2021-12-28 NOTE — Telephone Encounter (Signed)
Gabapentin

## 2021-12-29 DIAGNOSIS — G894 Chronic pain syndrome: Secondary | ICD-10-CM | POA: Diagnosis present

## 2021-12-29 DIAGNOSIS — Z20822 Contact with and (suspected) exposure to covid-19: Secondary | ICD-10-CM | POA: Diagnosis present

## 2021-12-29 DIAGNOSIS — F419 Anxiety disorder, unspecified: Secondary | ICD-10-CM | POA: Diagnosis present

## 2021-12-29 DIAGNOSIS — D5700 Hb-SS disease with crisis, unspecified: Secondary | ICD-10-CM

## 2021-12-29 DIAGNOSIS — F17210 Nicotine dependence, cigarettes, uncomplicated: Secondary | ICD-10-CM | POA: Diagnosis present

## 2021-12-29 DIAGNOSIS — F17290 Nicotine dependence, other tobacco product, uncomplicated: Secondary | ICD-10-CM | POA: Diagnosis present

## 2021-12-29 DIAGNOSIS — F32A Depression, unspecified: Secondary | ICD-10-CM | POA: Diagnosis present

## 2021-12-29 DIAGNOSIS — D638 Anemia in other chronic diseases classified elsewhere: Secondary | ICD-10-CM | POA: Diagnosis present

## 2021-12-29 LAB — CBC
HCT: 23.6 % — ABNORMAL LOW (ref 36.0–46.0)
Hemoglobin: 9 g/dL — ABNORMAL LOW (ref 12.0–15.0)
MCH: 31.4 pg (ref 26.0–34.0)
MCHC: 38.1 g/dL — ABNORMAL HIGH (ref 30.0–36.0)
MCV: 82.2 fL (ref 80.0–100.0)
Platelets: 348 10*3/uL (ref 150–400)
RBC: 2.87 MIL/uL — ABNORMAL LOW (ref 3.87–5.11)
RDW: 14.9 % (ref 11.5–15.5)
WBC: 9.2 10*3/uL (ref 4.0–10.5)
nRBC: 0.2 % (ref 0.0–0.2)

## 2021-12-29 MED ORDER — DIPHENHYDRAMINE HCL 50 MG/ML IJ SOLN: 12.5000 mg | Freq: Four times a day (QID) | INTRAMUSCULAR | Status: DC | PRN

## 2021-12-29 MED ORDER — SODIUM CHLORIDE 0.9% FLUSH
9.0000 mL | INTRAVENOUS | Status: DC | PRN
Start: 2021-12-29 — End: 2021-12-31

## 2021-12-29 MED ORDER — NALOXONE HCL 0.4 MG/ML IJ SOLN: 0.4000 mg | INTRAMUSCULAR | Status: DC | PRN

## 2021-12-29 MED ORDER — ENOXAPARIN SODIUM 40 MG/0.4ML IJ SOSY
40.0000 mg | PREFILLED_SYRINGE | INTRAMUSCULAR | Status: DC
  Administered 2021-12-29: 40 mg via SUBCUTANEOUS
  Filled 2021-12-29 (×2): qty 0.4

## 2021-12-29 MED ORDER — KETOROLAC TROMETHAMINE 15 MG/ML IJ SOLN
15.0000 mg | Freq: Four times a day (QID) | INTRAMUSCULAR | Status: DC
  Administered 2021-12-29: 15 mg via INTRAVENOUS
  Filled 2021-12-29: qty 1

## 2021-12-29 MED ORDER — HYDROMORPHONE HCL 2 MG/ML IJ SOLN
2.0000 mg | INTRAMUSCULAR | Status: AC | PRN
  Administered 2021-12-29 (×2): 2 mg via INTRAVENOUS
  Filled 2021-12-29 (×2): qty 1

## 2021-12-29 MED ORDER — HYDROMORPHONE 1 MG/ML IV SOLN
INTRAVENOUS | Status: DC
  Administered 2021-12-29: 4.8 mg via INTRAVENOUS
  Administered 2021-12-29: 2.6 mg via INTRAVENOUS
  Administered 2021-12-29: 30 mg via INTRAVENOUS
  Administered 2021-12-30 (×2): 3 mg via INTRAVENOUS
  Administered 2021-12-30 (×2): 1.8 mg via INTRAVENOUS
  Administered 2021-12-30: 0.6 mg via INTRAVENOUS
  Administered 2021-12-31: 1.2 mg via INTRAVENOUS
  Filled 2021-12-29: qty 30

## 2021-12-29 MED ORDER — DIPHENHYDRAMINE HCL 12.5 MG/5ML PO ELIX: 12.5000 mg | ORAL_SOLUTION | Freq: Four times a day (QID) | ORAL | Status: DC | PRN

## 2021-12-29 MED ORDER — POLYETHYLENE GLYCOL 3350 17 G PO PACK: 17.0000 g | PACK | Freq: Every day | ORAL | Status: DC | PRN

## 2021-12-29 MED ORDER — OXYCODONE HCL 5 MG PO TABS
10.0000 mg | ORAL_TABLET | Freq: Four times a day (QID) | ORAL | Status: DC | PRN
  Administered 2021-12-29 – 2021-12-31 (×6): 10 mg via ORAL
  Filled 2021-12-29 (×6): qty 2

## 2021-12-29 MED ORDER — DULOXETINE HCL 20 MG PO CPEP
20.0000 mg | ORAL_CAPSULE | Freq: Every day | ORAL | Status: DC
  Administered 2021-12-29 – 2021-12-31 (×3): 20 mg via ORAL
  Filled 2021-12-29 (×3): qty 1

## 2021-12-29 MED ORDER — HYDROMORPHONE HCL 2 MG/ML IJ SOLN
2.0000 mg | Freq: Once | INTRAMUSCULAR | Status: AC | PRN
  Administered 2021-12-29: 2 mg via INTRAVENOUS
  Filled 2021-12-29: qty 1

## 2021-12-29 MED ORDER — SODIUM CHLORIDE 0.45 % IV SOLN
INTRAVENOUS | Status: AC
Start: 2021-12-29 — End: 2021-12-29

## 2021-12-29 MED ORDER — GABAPENTIN 400 MG PO CAPS
400.0000 mg | ORAL_CAPSULE | Freq: Three times a day (TID) | ORAL | Status: DC
  Administered 2021-12-29 – 2021-12-31 (×7): 400 mg via ORAL
  Filled 2021-12-29 (×7): qty 1

## 2021-12-29 MED ORDER — ALPRAZOLAM 0.25 MG PO TABS
0.2500 mg | ORAL_TABLET | Freq: Two times a day (BID) | ORAL | Status: DC | PRN
  Administered 2021-12-31: 0.25 mg via ORAL
  Filled 2021-12-29: qty 1

## 2021-12-29 MED ORDER — FOLIC ACID 1 MG PO TABS
1.0000 mg | ORAL_TABLET | Freq: Every day | ORAL | Status: DC
  Administered 2021-12-29 – 2021-12-31 (×3): 1 mg via ORAL
  Filled 2021-12-29 (×3): qty 1

## 2021-12-29 MED ORDER — OXYCODONE HCL ER 10 MG PO T12A
10.0000 mg | EXTENDED_RELEASE_TABLET | Freq: Two times a day (BID) | ORAL | Status: DC
  Administered 2021-12-29 – 2021-12-31 (×5): 10 mg via ORAL
  Filled 2021-12-29 (×5): qty 1

## 2021-12-29 MED ORDER — KETOROLAC TROMETHAMINE 15 MG/ML IJ SOLN
15.0000 mg | Freq: Four times a day (QID) | INTRAMUSCULAR | Status: DC
  Administered 2021-12-29 – 2021-12-31 (×9): 15 mg via INTRAVENOUS
  Filled 2021-12-29 (×9): qty 1

## 2021-12-29 MED ORDER — SENNOSIDES-DOCUSATE SODIUM 8.6-50 MG PO TABS
1.0000 | ORAL_TABLET | Freq: Two times a day (BID) | ORAL | Status: DC
  Administered 2021-12-29 – 2021-12-31 (×4): 1 via ORAL
  Filled 2021-12-29 (×5): qty 1

## 2021-12-29 MED ORDER — KETOROLAC TROMETHAMINE 15 MG/ML IJ SOLN: 15.0000 mg | Freq: Four times a day (QID) | INTRAMUSCULAR | Status: DC

## 2021-12-29 MED ORDER — HYDROMORPHONE HCL 2 MG/ML IJ SOLN
2.0000 mg | INTRAMUSCULAR | Status: DC | PRN
  Administered 2021-12-29 (×2): 2 mg via INTRAVENOUS
  Filled 2021-12-29 (×2): qty 1

## 2021-12-29 MED ORDER — OXYCODONE HCL ER 10 MG PO T12A: 10.0000 mg | EXTENDED_RELEASE_TABLET | Freq: Two times a day (BID) | ORAL | Status: DC

## 2021-12-29 NOTE — H&P (Signed)
History and Physical    Lindsey [name] SWF:[id number] DOB: Mar 30, 1971 DOA: 12/28/2021  PCP: Massie Maroon, FNP  Patient coming from: Home  Chief Complaint: Back pain  HPI: Lindsey [name] is a 51 y.o. female with medical history significant of sickle cell disease, chronic pain syndrome, opiate dependence and tolerance, anemia of chronic disease, anxiety/depression presenting to the ED with sickle cell pain crisis/low back pain.  Slightly tachycardic on arrival but afebrile.  Tachycardia subsequently resolved.  Labs showing no leukocytosis, hemoglobin 10.8 (stable), platelet count 429k. Patient was given multiple rounds of IV Dilaudid and continued to have severe pain.  TRH called to admit for sickle cell pain crisis.  Patient is endorsing pain in her low back consistent with prior sickle cell crisis episodes.  No falls or injuries to her back.  No fevers, lower extremity weakness, or bowel/bladder dysfunction.  Pain started 3-4 days ago.  She is taking her home medications including oxycodone 10 mg every 12 hours and Oxy IR 10 mg every 6 hours but continued to have severe pain.  She had 1 episode of vomiting prior to coming into the ED but has not vomited since then and does not feel nauseous at present.  Denies abdominal pain, constipation, or diarrhea.  Denies cough, shortness of breath, or chest pain.  Review of Systems:  Review of Systems  All other systems reviewed and are negative.   Past Medical History:  Diagnosis Date   Acute kidney injury (HCC) 01/18/2021   Acute pain of left shoulder    Cough 06/27/2020   Hypokalemia 06/27/2020   Hypoxia    Leukocytosis 09/03/2016   Opiate abuse, episodic (HCC) 09/25/2017   Opioid dependence in remission (HCC)    Right leg pain    Sickle cell crisis [facility])     Past Surgical History:  Procedure Laterality Date   APPENDECTOMY     CESAREAN SECTION     OTHER SURGICAL HISTORY     c-section     reports that she has been smoking  cigars and cigarettes. She has never used smokeless tobacco. She reports current alcohol use. She reports that she does not use drugs.  Allergies  Allergen Reactions   Ketamine Anxiety    Tachycardia    Family History  Problem Relation Age of Onset   Stroke Neg Hx        none that she knows of    Seizures Neg Hx     Prior to Admission medications   Medication Sig Start Date End Date Taking? Authorizing Provider  ALPRAZolam (XANAX) 0.25 MG tablet Take 1 tablet (0.25 mg total) by mouth 2 (two) times daily as needed for anxiety. 12/14/21  Yes Massie Maroon, FNP  diphenhydrAMINE (BENADRYL ALLERGY) 25 MG tablet Take 50 mg by mouth at bedtime as needed for sleep.   Yes [provider]  DULoxetine (CYMBALTA) 20 MG capsule Take 1 capsule (20 mg total) by mouth daily. 12/14/21 12/14/22 Yes Massie Maroon, FNP  folic acid (FOLVITE) 1 MG tablet Take 1 tablet (1 mg total) by mouth daily. 03/30/21 03/30/22 Yes Massie Maroon, FNP  gabapentin (NEURONTIN) 400 MG capsule TAKE 1 CAPSULE(400 MG) BY MOUTH THREE TIMES DAILY Patient taking differently: Take 400 mg by mouth 3 (three) times daily. 09/15/21  Yes Massie Maroon, FNP  ibuprofen (ADVIL) 800 MG tablet Take 1 tablet (800 mg total) by mouth every 8 (eight) hours as needed. Patient taking differently: Take 800 mg by mouth every  8 (eight) hours as needed for moderate pain. 05/20/21  Yes Massie Maroon, FNP  oxyCODONE (OXYCONTIN) 10 mg 12 hr tablet Take 1 tablet (10 mg total) by mouth every 12 (twelve) hours. 12/02/21 01/01/22 Yes Massie Maroon, FNP  Oxycodone HCl 10 MG TABS Take 1 tablet (10 mg total) by mouth every 6 (six) hours as needed for up to 15 days (for moderate to severe pain). 12/18/21 01/02/22 Yes Massie Maroon, FNP    Physical Exam: Vitals:   12/29/21 0430 12/29/21 0500 12/29/21 0507 12/29/21 0519  BP: 106/77 (!) 88/45  109/81  Pulse: 80 (!) 56  67  Resp: 14 13  15   Temp:   97.8 F (36.6 C)   TempSrc:       SpO2: 99% 100%  100%  Weight:      Height:        Physical Exam Vitals reviewed.  Constitutional:      General: She is not in acute distress. HENT:     Head: Normocephalic and atraumatic.  Eyes:     Extraocular Movements: Extraocular movements intact.  Cardiovascular:     Rate and Rhythm: Normal rate and regular rhythm.     Pulses: Normal pulses.  Pulmonary:     Effort: Pulmonary effort is normal. No respiratory distress.     Breath sounds: Normal breath sounds.  Abdominal:     General: Bowel sounds are normal. There is no distension.     Palpations: Abdomen is soft.     Tenderness: There is no abdominal tenderness.  Musculoskeletal:     Cervical back: Normal range of motion.     Right lower leg: No edema.     Left lower leg: No edema.  Skin:    General: Skin is warm and dry.  Neurological:     General: No focal deficit present.     Mental Status: She is alert and oriented to person, place, and time.      Labs on Admission: I have personally reviewed following labs and imaging studies  CBC: Recent Labs  Lab 12/28/21 1947  WBC 9.9  NEUTROABS 5.2  HGB 10.8*  HCT 29.0*  MCV 82.9  PLT 429*   Basic Metabolic Panel: Recent Labs  Lab 12/28/21 1947  NA 142  K 3.7  CL 111  CO2 25  GLUCOSE 101*  BUN 10  CREATININE 0.90  CALCIUM 9.3   GFR: Estimated Creatinine Clearance: 62.2 mL/min (by C-G formula based on SCr of 0.9 mg/dL). Liver Function Tests: Recent Labs  Lab 12/28/21 1947  AST 16  ALT 13  ALKPHOS 45  BILITOT 1.3*  PROT 7.5  ALBUMIN 4.4   No results for input(s): "LIPASE", "AMYLASE" in the last 168 hours. No results for input(s): "AMMONIA" in the last 168 hours. Coagulation Profile: No results for input(s): "INR", "PROTIME" in the last 168 hours. Cardiac Enzymes: No results for input(s): "CKTOTAL", "CKMB", "CKMBINDEX", "TROPONINI" in the last 168 hours. BNP (last 3 results) No results for input(s): "PROBNP" in the last 8760  hours. HbA1C: No results for input(s): "HGBA1C" in the last 72 hours. CBG: No results for input(s): "GLUCAP" in the last 168 hours. Lipid Profile: No results for input(s): "CHOL", "HDL", "LDLCALC", "TRIG", "CHOLHDL", "LDLDIRECT" in the last 72 hours. Thyroid Function Tests: No results for input(s): "TSH", "T4TOTAL", "FREET4", "T3FREE", "THYROIDAB" in the last 72 hours. Anemia Panel: Recent Labs    12/28/21 1947  RETICCTPCT 3.1   Urine analysis:    Component Value  Date/Time   COLORURINE YELLOW 05/31/2021 1350   APPEARANCEUR TURBID (A) 05/31/2021 1350   LABSPEC 1.012 05/31/2021 1350   PHURINE 5.0 05/31/2021 1350   GLUCOSEU NEGATIVE 05/31/2021 1350   HGBUR SMALL (A) 05/31/2021 1350   BILIRUBINUR NEGATIVE 05/31/2021 1350   BILIRUBINUR neg 04/07/2020 1014   KETONESUR NEGATIVE 05/31/2021 1350   PROTEINUR 100 (A) 05/31/2021 1350   UROBILINOGEN 0.2 04/07/2020 1014   UROBILINOGEN 1.0 12/25/2012 1755   NITRITE NEGATIVE 05/31/2021 1350   LEUKOCYTESUR MODERATE (A) 05/31/2021 1350    Radiological Exams on Admission: I have personally reviewed images No results found.  EKG: Independently reviewed.  Sinus rhythm.  Assessment and Plan  Sickle cell pain crisis -0.45% saline at 100 cc/h -Dilaudid PCA -Toradol 15 mg every 6 hours -Monitor closely and reevaluate pain scale regularly  Chronic pain syndrome -Continue home medications  Anemia of chronic disease Hemoglobin at baseline and no indication for blood transfusion at this time. -Monitor H&H  Anxiety/depression -Continue home medications  DVT prophylaxis: Lovenox Code Status: Full Code Family Communication: Discussed with the patient, no family available at this time. Level of care: Med-Surg Admission status: It is my clinical opinion that referral for OBSERVATION is reasonable and necessary in this patient based on the above information provided. The aforementioned taken together are felt to place the patient at high  risk for further clinical deterioration. However, it is anticipated that the patient may be medically stable for discharge from the hospital within 24 to 48 hours.   John Giovanni MD [facility]  If 7PM-7AM, please contact night-coverage [email or web address]  12/29/2021, 5:56 AM

## 2021-12-29 NOTE — ED Notes (Signed)
Secure chat sent to receiving nurse on inpatient unit

## 2021-12-29 NOTE — ED Notes (Signed)
Patient provided with breakfast sandwich.

## 2021-12-29 NOTE — ED Provider Notes (Signed)
  Physical Exam  BP (!) 101/53   Pulse 68   Temp 97.9 F (36.6 C) (Oral)   Resp 12   Ht 5' (1.524 m)   Wt 63.5 kg   SpO2 100%   BMI 27.34 kg/m   Physical Exam Vitals and nursing note reviewed.  Constitutional:      General: She is not in acute distress.    Appearance: She is well-developed.  HENT:     Head: Normocephalic and atraumatic.  Eyes:     Conjunctiva/sclera: Conjunctivae normal.  Cardiovascular:     Rate and Rhythm: Normal rate and regular rhythm.     Heart sounds: No murmur heard. Pulmonary:     Effort: Pulmonary effort is normal. No respiratory distress.     Breath sounds: Normal breath sounds.  Abdominal:     Palpations: Abdomen is soft.     Tenderness: There is no abdominal tenderness.  Musculoskeletal:        General: No swelling.     Cervical back: Neck supple.  Skin:    General: Skin is warm and dry.     Capillary Refill: Capillary refill takes less than 2 seconds.  Neurological:     Mental Status: She is alert.  Psychiatric:        Mood and Affect: Mood normal.     Procedures  Procedures  ED Course / MDM    Medical Decision Making Amount and/or Complexity of Data Reviewed Labs: ordered.  Risk Prescription drug management. Decision regarding hospitalization.   Patient received in handoff.  Sickle cell pain crisis pending admission.  Patient admitted.       Glendora Score, MD 12/29/21 [phone]

## 2021-12-29 NOTE — ED Notes (Signed)
Pt is asking for pain meds. 

## 2021-12-30 DIAGNOSIS — F419 Anxiety disorder, unspecified: Secondary | ICD-10-CM

## 2021-12-30 DIAGNOSIS — F32A Depression, unspecified: Secondary | ICD-10-CM

## 2021-12-30 DIAGNOSIS — G894 Chronic pain syndrome: Secondary | ICD-10-CM

## 2021-12-30 DIAGNOSIS — D638 Anemia in other chronic diseases classified elsewhere: Secondary | ICD-10-CM

## 2021-12-30 MED ORDER — SODIUM CHLORIDE 0.45 % IV SOLN: INTRAVENOUS | Status: DC

## 2021-12-30 NOTE — Progress Notes (Signed)
SICKLE CELL SERVICE PROGRESS NOTE  Lindsey [name] ZOX:[id number] DOB: 28-Jul-1970 DOA: 12/28/2021 PCP: Massie Maroon, FNP  Assessment/Plan: Active Problems:   Chronic pain   Anemia of chronic disease   Sickle cell pain crisis (HCC)   Anxiety and depression  Sickle cell pain crisis: Patient is currently on Dilaudid PCA, Toradol and IV fluids.  She is continue to have pain but better.  Rate is down to 6 out of 10.  No fever or chills no nausea vomiting or diarrhea Anemia of chronic disease: Hemoglobin is at baseline.  Continue to monitor Chronic pain syndrome: Continue home medications. Anxiety disorder: Continue home regimen  Code Status: Full code Family Communication: No family at bedside Disposition Plan: Home  [facility]  Pager [phone]. If 7PM-7AM, please contact night-coverage.  12/30/2021, 7:23 PM  LOS: 1 day   Brief narrative: Lindsey [name] is a 51 y.o. female with medical history significant of sickle cell disease, chronic pain syndrome, opiate dependence and tolerance, anemia of chronic disease, anxiety/depression presenting to the ED with sickle cell pain crisis/low back pain.  Slightly tachycardic on arrival but afebrile.  Tachycardia subsequently resolved.  Labs showing no leukocytosis, hemoglobin 10.8 (stable), platelet count 429k. Patient was given multiple rounds of IV Dilaudid and continued to have severe pain.  TRH called to admit for sickle cell pain crisis.   Patient is endorsing pain in her low back consistent with prior sickle cell crisis episodes.  No falls or injuries to her back.  No fevers, lower extremity weakness, or bowel/bladder dysfunction.  Pain started 3-4 days ago.  She is taking her home medications including oxycodone 10 mg every 12 hours and Oxy IR 10 mg every 6 hours but continued to have severe pain.  She had 1 episode of vomiting prior to coming into the ED but has not vomited since then and does not feel nauseous at present.   Denies abdominal pain, constipation, or diarrhea.  Denies cough, shortness of breath, or chest pain.   Consultants: None  Procedures: None  Antibiotics: None  HPI/Subjective: Patient's pain is down to 7 out of 10.  No fever no chills no nausea vomiting or diarrhea  Objective: Vitals:   12/30/21 1116 12/30/21 1338 12/30/21 1638 12/30/21 1712  BP:  125/74  102/63  Pulse:  65  (!) 59  Resp: 14 17 15 17   Temp:  98.6 F (37 C)    TempSrc:  Oral    SpO2: 97% 99% 99% 95%  Weight:      Height:       Weight change:   Intake/Output Summary (Last 24 hours) at 12/30/2021 1923 Last data filed at 12/30/2021 0300 Gross per 24 hour  Intake 99.07 ml  Output --  Net 99.07 ml    General: Alert, awake, oriented x3, in no acute distress.  HEENT: [place]/AT PEERL, EOMI Neck: Trachea midline,  no masses, no thyromegal,y no JVD, no carotid bruit OROPHARYNX:  Moist, No exudate/ erythema/lesions.  Heart: Regular rate and rhythm, without murmurs, rubs, gallops, PMI non-displaced, no heaves or thrills on palpation.  Lungs: Clear to auscultation, no wheezing or rhonchi noted. No increased vocal fremitus resonant to percussion  Abdomen: Soft, nontender, nondistended, positive bowel sounds, no masses no hepatosplenomegaly noted..  Neuro: No focal neurological deficits noted cranial nerves II through XII grossly intact. DTRs 2+ bilaterally upper and lower extremities. Strength 5 out of 5 in bilateral upper and lower extremities. Musculoskeletal: No warm swelling or erythema around joints, no  spinal tenderness noted. Psychiatric: Patient alert and oriented x3, good insight and cognition, good recent to remote recall. Lymph node survey: No cervical axillary or inguinal lymphadenopathy noted.   Data Reviewed: Basic Metabolic Panel: Recent Labs  Lab 12/28/21 1947  NA 142  K 3.7  CL 111  CO2 25  GLUCOSE 101*  BUN 10  CREATININE 0.90  CALCIUM 9.3   Liver Function Tests: Recent Labs  Lab  12/28/21 1947  AST 16  ALT 13  ALKPHOS 45  BILITOT 1.3*  PROT 7.5  ALBUMIN 4.4   No results for input(s): "LIPASE", "AMYLASE" in the last 168 hours. No results for input(s): "AMMONIA" in the last 168 hours. CBC: Recent Labs  Lab 12/28/21 1947 12/29/21 0920  WBC 9.9 9.2  NEUTROABS 5.2  --   HGB 10.8* 9.0*  HCT 29.0* 23.6*  MCV 82.9 82.2  PLT 429* 348   Cardiac Enzymes: No results for input(s): "CKTOTAL", "CKMB", "CKMBINDEX", "TROPONINI" in the last 168 hours. BNP (last 3 results) No results for input(s): "BNP" in the last 8760 hours.  ProBNP (last 3 results) No results for input(s): "PROBNP" in the last 8760 hours.  CBG: No results for input(s): "GLUCAP" in the last 168 hours.  No results found for this or any previous visit (from the past 240 hour(s)).   Studies: No results found.  Scheduled Meds:  DULoxetine  20 mg Oral Daily   enoxaparin (LOVENOX) injection  40 mg Subcutaneous Q24H   folic acid  1 mg Oral Daily   gabapentin  400 mg Oral TID   HYDROmorphone   Intravenous Q4H   ketorolac  15 mg Intravenous Q6H   oxyCODONE  10 mg Oral Q12H   senna-docusate  1 tablet Oral BID   Continuous Infusions:  sodium chloride 100 mL/hr at 12/30/21 1610    Active Problems:   Chronic pain   Anemia of chronic disease   Sickle cell pain crisis (HCC)   Anxiety and depression

## 2021-12-30 NOTE — Progress Notes (Signed)
.   Transition of Care [facility]) Screening Note   Patient Details  Name: Lindsey [name] Date of Birth: [birth year]   Transition of Care [facility]) CM/SW Contact:    Larrie Kass, LCSW Phone Number: 12/30/2021, 12:15 PM    Transition of Care Department [facility]) has reviewed patient and no TOC needs have been identified at this time. We will continue to monitor patient advancement through interdisciplinary progression rounds. If new patient transition needs arise, please place a TOC consult.

## 2021-12-31 ENCOUNTER — Telehealth: Payer: Self-pay | Admitting: Family Medicine

## 2021-12-31 ENCOUNTER — Other Ambulatory Visit: Payer: Self-pay | Admitting: Internal Medicine

## 2021-12-31 ENCOUNTER — Telehealth: Payer: Self-pay | Admitting: Internal Medicine

## 2021-12-31 ENCOUNTER — Other Ambulatory Visit: Payer: Self-pay

## 2021-12-31 ENCOUNTER — Telehealth: Payer: Self-pay | Admitting: Nurse Practitioner

## 2021-12-31 ENCOUNTER — Encounter (HOSPITAL_COMMUNITY): Payer: Self-pay

## 2021-12-31 ENCOUNTER — Inpatient Hospital Stay (HOSPITAL_COMMUNITY)
Admission: EM | Admit: 2021-12-31 | Discharge: 2022-01-03 | DRG: 812 | Disposition: A | Discharge status: Home / Self Care | Payer: Medicaid Other | Attending: Internal Medicine | Admitting: Internal Medicine

## 2021-12-31 DIAGNOSIS — Z888 Allergy status to other drugs, medicaments and biological substances status: Secondary | ICD-10-CM

## 2021-12-31 DIAGNOSIS — D5700 Hb-SS disease with crisis, unspecified: Principal | ICD-10-CM | POA: Diagnosis present

## 2021-12-31 DIAGNOSIS — F419 Anxiety disorder, unspecified: Secondary | ICD-10-CM

## 2021-12-31 DIAGNOSIS — F17290 Nicotine dependence, other tobacco product, uncomplicated: Secondary | ICD-10-CM | POA: Diagnosis present

## 2021-12-31 DIAGNOSIS — Z79899 Other long term (current) drug therapy: Secondary | ICD-10-CM

## 2021-12-31 DIAGNOSIS — F32A Depression, unspecified: Secondary | ICD-10-CM | POA: Diagnosis present

## 2021-12-31 DIAGNOSIS — G894 Chronic pain syndrome: Secondary | ICD-10-CM

## 2021-12-31 DIAGNOSIS — D638 Anemia in other chronic diseases classified elsewhere: Secondary | ICD-10-CM | POA: Diagnosis present

## 2021-12-31 DIAGNOSIS — F17210 Nicotine dependence, cigarettes, uncomplicated: Secondary | ICD-10-CM | POA: Diagnosis present

## 2021-12-31 DIAGNOSIS — Z9049 Acquired absence of other specified parts of digestive tract: Secondary | ICD-10-CM

## 2021-12-31 LAB — BASIC METABOLIC PANEL
Anion gap: 6 (ref 5–15)
BUN: 12 mg/dL (ref 6–20)
CO2: 24 mmol/L (ref 22–32)
Calcium: 8.7 mg/dL — ABNORMAL LOW (ref 8.9–10.3)
Chloride: 113 mmol/L — ABNORMAL HIGH (ref 98–111)
Creatinine, Ser: 0.91 mg/dL (ref 0.44–1.00)
GFR, Estimated: >60 mL/min (ref >60)
Glucose, Bld: 93 mg/dL (ref 70–99)
Potassium: 3.9 mmol/L (ref 3.5–5.1)
Sodium: 143 mmol/L (ref 135–145)

## 2021-12-31 LAB — COMPREHENSIVE METABOLIC PANEL
ALT: 15 U/L (ref 0–44)
AST: 18 U/L (ref 15–41)
Albumin: 3.4 g/dL — ABNORMAL LOW (ref 3.5–5.0)
Alkaline Phosphatase: 38 U/L (ref 38–126)
Anion gap: 5 (ref 5–15)
BUN: 14 mg/dL (ref 6–20)
CO2: 24 mmol/L (ref 22–32)
Calcium: 8.5 mg/dL — ABNORMAL LOW (ref 8.9–10.3)
Chloride: 112 mmol/L — ABNORMAL HIGH (ref 98–111)
Creatinine, Ser: 0.81 mg/dL (ref 0.44–1.00)
GFR, Estimated: >60 mL/min (ref >60)
Glucose, Bld: 89 mg/dL (ref 70–99)
Potassium: 4 mmol/L (ref 3.5–5.1)
Sodium: 141 mmol/L (ref 135–145)
Total Bilirubin: 1 mg/dL (ref 0.3–1.2)
Total Protein: 5.8 g/dL — ABNORMAL LOW (ref 6.5–8.1)

## 2021-12-31 LAB — CBC WITH DIFFERENTIAL/PLATELET
Abs Immature Granulocytes: 0.04 10*3/uL (ref 0.00–0.07)
Abs Immature Granulocytes: 0.1 10*3/uL — ABNORMAL HIGH (ref 0.00–0.07)
Basophils Absolute: 0 10*3/uL (ref 0.0–0.1)
Basophils Absolute: 0.1 10*3/uL (ref 0.0–0.1)
Basophils Relative: 0 %
Basophils Relative: 0 %
Eosinophils Absolute: 0.5 10*3/uL (ref 0.0–0.5)
Eosinophils Absolute: 0.4 10*3/uL (ref 0.0–0.5)
Eosinophils Relative: 6 %
Eosinophils Relative: 3 %
HCT: 22.7 % — ABNORMAL LOW (ref 36.0–46.0)
HCT: 24.8 % — ABNORMAL LOW (ref 36.0–46.0)
Hemoglobin: 8.5 g/dL — ABNORMAL LOW (ref 12.0–15.0)
Hemoglobin: 9.3 g/dL — ABNORMAL LOW (ref 12.0–15.0)
Immature Granulocytes: 0 %
Immature Granulocytes: 1 %
Lymphocytes Relative: 48 %
Lymphocytes Relative: 18 %
Lymphs Abs: 4.3 10*3/uL — ABNORMAL HIGH (ref 0.7–4.0)
Lymphs Abs: 2.6 10*3/uL (ref 0.7–4.0)
MCH: 31.1 pg (ref 26.0–34.0)
MCH: 31 pg (ref 26.0–34.0)
MCHC: 37.4 g/dL — ABNORMAL HIGH (ref 30.0–36.0)
MCHC: 37.5 g/dL — ABNORMAL HIGH (ref 30.0–36.0)
MCV: 83.2 fL (ref 80.0–100.0)
MCV: 82.7 fL (ref 80.0–100.0)
Monocytes Absolute: 0.6 10*3/uL (ref 0.1–1.0)
Monocytes Absolute: 1.1 10*3/uL — ABNORMAL HIGH (ref 0.1–1.0)
Monocytes Relative: 7 %
Monocytes Relative: 8 %
Neutro Abs: 3.6 10*3/uL (ref 1.7–7.7)
Neutro Abs: 9.8 10*3/uL — ABNORMAL HIGH (ref 1.7–7.7)
Neutrophils Relative %: 39 %
Neutrophils Relative %: 70 %
Platelets: 310 10*3/uL (ref 150–400)
Platelets: 338 10*3/uL (ref 150–400)
RBC: 2.73 MIL/uL — ABNORMAL LOW (ref 3.87–5.11)
RBC: 3 MIL/uL — ABNORMAL LOW (ref 3.87–5.11)
RDW: 15.2 % (ref 11.5–15.5)
RDW: 15.5 % (ref 11.5–15.5)
WBC: 9 10*3/uL (ref 4.0–10.5)
WBC: 13.9 10*3/uL — ABNORMAL HIGH (ref 4.0–10.5)
nRBC: 0.4 % — ABNORMAL HIGH (ref 0.0–0.2)
nRBC: 0.4 % — ABNORMAL HIGH (ref 0.0–0.2)

## 2021-12-31 LAB — RETICULOCYTES
Immature Retic Fract: 31.4 % — ABNORMAL HIGH (ref 2.3–15.9)
RBC.: 3.01 MIL/uL — ABNORMAL LOW (ref 3.87–5.11)
Retic Count, Absolute: 143 10*3/uL (ref 19.0–186.0)
Retic Ct Pct: 4.8 % — ABNORMAL HIGH (ref 0.4–3.1)

## 2021-12-31 MED ORDER — LACTATED RINGERS IV BOLUS
1000.0000 mL | Freq: Once | INTRAVENOUS | Status: AC
  Administered 2021-12-31: 1000 mL via INTRAVENOUS

## 2021-12-31 MED ORDER — HYDROMORPHONE HCL 2 MG/ML IJ SOLN
2.0000 mg | Freq: Once | INTRAMUSCULAR | Status: AC
  Administered 2021-12-31: 2 mg via INTRAVENOUS
  Filled 2021-12-31: qty 1

## 2021-12-31 MED ORDER — KETOROLAC TROMETHAMINE 15 MG/ML IJ SOLN
15.0000 mg | Freq: Once | INTRAMUSCULAR | Status: AC
  Administered 2021-12-31: 15 mg via INTRAVENOUS
  Filled 2021-12-31: qty 1

## 2021-12-31 MED ORDER — DIPHENHYDRAMINE HCL 50 MG/ML IJ SOLN
25.0000 mg | Freq: Once | INTRAMUSCULAR | Status: AC
  Administered 2021-12-31: 25 mg via INTRAVENOUS
  Filled 2021-12-31: qty 1

## 2021-12-31 MED ORDER — DULOXETINE HCL 20 MG PO CPEP: 20.0000 mg | ORAL_CAPSULE | Freq: Every day | ORAL | 11 refills | Status: DC

## 2021-12-31 MED ORDER — IBUPROFEN 800 MG PO TABS: 800.0000 mg | ORAL_TABLET | Freq: Three times a day (TID) | ORAL | 0 refills | Status: AC | PRN

## 2021-12-31 MED ORDER — GABAPENTIN 400 MG PO CAPS: 400.0000 mg | ORAL_CAPSULE | Freq: Three times a day (TID) | ORAL | 0 refills | Status: DC

## 2021-12-31 MED ORDER — ALPRAZOLAM 0.25 MG PO TABS: 0.2500 mg | ORAL_TABLET | Freq: Two times a day (BID) | ORAL | 0 refills | Status: DC | PRN

## 2021-12-31 MED ORDER — FOLIC ACID 1 MG PO TABS: 1.0000 mg | ORAL_TABLET | Freq: Every day | ORAL | 11 refills | Status: AC

## 2021-12-31 MED ORDER — ACETAMINOPHEN 325 MG PO TABS
650.0000 mg | ORAL_TABLET | Freq: Four times a day (QID) | ORAL | Status: DC | PRN
  Administered 2021-12-31: 650 mg via ORAL
  Filled 2021-12-31: qty 2

## 2021-12-31 MED ORDER — OXYCODONE HCL ER 10 MG PO T12A: 10.0000 mg | EXTENDED_RELEASE_TABLET | Freq: Two times a day (BID) | ORAL | 0 refills | Status: AC

## 2021-12-31 MED ORDER — ONDANSETRON HCL 4 MG/2ML IJ SOLN
4.0000 mg | Freq: Once | INTRAMUSCULAR | Status: AC
  Administered 2021-12-31: 4 mg via INTRAVENOUS
  Filled 2021-12-31: qty 2

## 2021-12-31 NOTE — Progress Notes (Signed)
  Transition of Care [facility]) Screening Note   Patient Details  Name: Lindsey [name] Date of Birth: 1970/10/12   Transition of Care [facility]) CM/SW Contact:    Lavenia Atlas, RN Phone Number: 12/31/2021, 10:59 AM    Transition of Care Department [facility]) has reviewed patient and no TOC needs have been identified at this time. We will continue to monitor patient advancement through interdisciplinary progression rounds. If new patient transition needs arise, please place a TOC consult.

## 2021-12-31 NOTE — Telephone Encounter (Signed)
Patient called after hours on call line. Returned patient's call.Lindsey KitchenMarland KitchenShe states she is having a pain crisis. Advised her to go to the ED. Dr. Mikeal Hawthorne refilled her pain medication earlier today. Patient agreed to go to the ED.

## 2021-12-31 NOTE — ED Provider Notes (Signed)
[facility]EMERGENCY DEPT Provider Note  CSN: [id number] Arrival date & time: 12/31/21 2030  Chief Complaint(s) Sickle Cell Pain Crisis  HPI Lindsey [name] is a 51 y.o. female history of sickle cell disease presenting to the emergency department with back pain.  Patient reports bilateral low back pain.  She reports this is the same as her chronic pain.  She denies any chest pain, shortness of breath, fevers, chills.  She patient was just admitted to the hospital reports her pain was controlled at time of discharge, but since discharge, has had return of her pain, which she rates as severe.  This began today.  She reports she tried her home pain medication without relief. Pain is constant.  She denies any numbness, tingling, bowel or bladder incontinence, groin numbness, difficulty ambulating   Past Medical History Past Medical History:  Diagnosis Date   Acute kidney injury (HCC) 01/18/2021   Acute pain of left shoulder    Cough 06/27/2020   Hypokalemia 06/27/2020   Hypoxia    Leukocytosis 09/03/2016   Opiate abuse, episodic (HCC) 09/25/2017   Opioid dependence in remission (HCC)    Right leg pain    Sickle cell crisis [facility])    Patient Active Problem List   Diagnosis Date Noted   Anxiety and depression 12/29/2021   Sickle cell pain crisis (HCC) 11/30/2021   Anemia of chronic disease 10/29/2021   Sickle cell anemia with crisis (HCC) 10/09/2021   Vasoocclusive sickle cell crisis (HCC) 07/17/2021   Sickle cell crisis (HCC) 06/19/2021   Pain of right lower extremity    Sickle cell anemia (HCC) 09/10/2019   Current mild episode of major depressive disorder (HCC)    Sickle cell disease (HCC) 10/01/2017   Chronic pain 10/01/2017   Chronic narcotic use 09/25/2017   Home Medication(s) Prior to Admission medications   Medication Sig Start Date End Date Taking? Authorizing Provider  ALPRAZolam (XANAX) 0.25 MG tablet Take 1 tablet (0.25 mg total) by mouth 2 (two)  times daily as needed for anxiety. 12/31/21  Yes Rometta Emery, MD  diphenhydrAMINE (BENADRYL ALLERGY) 25 MG tablet Take 50 mg by mouth at bedtime as needed for sleep.   Yes [provider]  DULoxetine (CYMBALTA) 20 MG capsule Take 1 capsule (20 mg total) by mouth daily. 12/31/21 12/31/22 Yes Rometta Emery, MD  folic acid (FOLVITE) 1 MG tablet Take 1 tablet (1 mg total) by mouth daily. 12/31/21 12/31/22 Yes Rometta Emery, MD  gabapentin (NEURONTIN) 400 MG capsule Take 1 capsule (400 mg total) by mouth 3 (three) times daily. 12/31/21  Yes Rometta Emery, MD  ibuprofen (ADVIL) 800 MG tablet Take 1 tablet (800 mg total) by mouth every 8 (eight) hours as needed for moderate pain. 12/31/21 01/30/22 Yes Rometta Emery, MD  oxyCODONE (OXYCONTIN) 10 mg 12 hr tablet Take 1 tablet (10 mg total) by mouth every 12 (twelve) hours. 12/31/21 01/30/22 Yes Rometta Emery, MD  Oxycodone HCl 10 MG TABS Take 1 tablet (10 mg total) by mouth every 6 (six) hours as needed for up to 15 days (for moderate to severe pain). 12/18/21 01/02/22 Yes Massie Maroon, FNP  Past Surgical History Past Surgical History:  Procedure Laterality Date   APPENDECTOMY     CESAREAN SECTION     OTHER SURGICAL HISTORY     c-section   Family History Family History  Problem Relation Age of Onset   Stroke Neg Hx        none that she knows of    Seizures Neg Hx     Social History Social History   Tobacco Use   Smoking status: Some Days    Packs/day: 0.00    Types: Cigars, Cigarettes   Smokeless tobacco: Never  Vaping Use   Vaping Use: Former  Substance Use Topics   Alcohol use: Yes    Comment: occasionally   Drug use: No   Allergies Ketamine  Review of Systems Review of Systems  All other systems reviewed and are negative.   Physical Exam Vital Signs  I have reviewed  the triage vital signs BP 112/67   Pulse 84   Temp 99 F (37.2 C) (Oral)   Resp 11   Ht 5' (1.524 m)   Wt 63.5 kg   SpO2 96%   BMI 27.34 kg/m  Physical Exam Vitals and nursing note reviewed.  Constitutional:      General: She is in acute distress (due to pain).     Appearance: She is well-developed.  HENT:     Head: Normocephalic and atraumatic.     Mouth/Throat:     Mouth: Mucous membranes are moist.  Eyes:     Pupils: Pupils are equal, round, and reactive to light.  Cardiovascular:     Rate and Rhythm: Normal rate and regular rhythm.     Heart sounds: No murmur heard. Pulmonary:     Effort: Pulmonary effort is normal. No respiratory distress.     Breath sounds: Normal breath sounds.  Abdominal:     General: Abdomen is flat.     Palpations: Abdomen is soft.     Tenderness: There is no abdominal tenderness.  Musculoskeletal:        General: No tenderness.     Right lower leg: No edema.     Left lower leg: No edema.     Comments: No midline tenderness, to the C, T, L-spine.  Mild bilateral low back paraspinal tenderness  Skin:    General: Skin is warm and dry.  Neurological:     General: No focal deficit present.     Mental Status: She is alert. Mental status is at baseline.  Psychiatric:        Mood and Affect: Mood normal.        Behavior: Behavior normal.     ED Results and Treatments Labs (all labs ordered are listed, but only abnormal results are displayed) Labs Reviewed  BASIC METABOLIC PANEL - Abnormal; Notable for the following components:      Result Value   Chloride 113 (*)    Calcium 8.7 (*)    All other components within normal limits  CBC WITH DIFFERENTIAL/PLATELET - Abnormal; Notable for the following components:   WBC 13.9 (*)    RBC 3.00 (*)    Hemoglobin 9.3 (*)    HCT 24.8 (*)    MCHC 37.5 (*)    nRBC 0.4 (*)    Neutro Abs 9.8 (*)    Monocytes Absolute 1.1 (*)    Abs Immature Granulocytes 0.10 (*)    All other components within  normal limits  RETICULOCYTES - Abnormal; Notable for the following components:  Retic Ct Pct 4.8 (*)    RBC. 3.01 (*)    Immature Retic Fract 31.4 (*)    All other components within normal limits                                                                                                                          Radiology No results found.  Pertinent labs & imaging results that were available during my care of the patient were reviewed by me and considered in my medical decision making (see MDM for details).  Medications Ordered in ED Medications  ketorolac (TORADOL) 15 MG/ML injection 15 mg (15 mg Intravenous Given 12/31/21 2206)  HYDROmorphone (DILAUDID) injection 2 mg (2 mg Intravenous Given 12/31/21 2207)  ondansetron (ZOFRAN) injection 4 mg (4 mg Intravenous Given 12/31/21 2207)  lactated ringers bolus 1,000 mL (1,000 mLs Intravenous New Bag/Given 12/31/21 2207)  HYDROmorphone (DILAUDID) injection 2 mg (2 mg Intravenous Given 12/31/21 2316)  ketorolac (TORADOL) 15 MG/ML injection 15 mg (15 mg Intravenous Given 12/31/21 2315)  diphenhydrAMINE (BENADRYL) injection 25 mg (25 mg Intravenous Given 12/31/21 2323)                                                                                                                                     Procedures Procedures  (including critical care time)  Medical Decision Making / ED Course   MDM:  51 year old female presenting to the emergency department with pain crisis.  Patient well-appearing, but in distress due to pain.  Will treat pain with typical pain regimen.  No chest pain, hypoxia to suggest acute chest syndrome.  She reports this pain is her chronic pain.  Pain basic lab test, notable for mild elevated WBC count, elevated from previous, possibly due to distress, no infectious symptoms, fevers, chills.  Hemoglobin at baseline.  Reticulocyte count reassuring.      Additional history obtained: -Additional history obtained from  husband -External records from outside source obtained and reviewed including: Chart review including previous notes, labs, imaging, consultation notes   Lab Tests: -I ordered, reviewed, and interpreted labs.   The pertinent results include:   Labs Reviewed  BASIC METABOLIC PANEL - Abnormal; Notable for the following components:      Result Value   Chloride 113 (*)    Calcium 8.7 (*)    All other components within normal limits  CBC WITH DIFFERENTIAL/PLATELET -  Abnormal; Notable for the following components:   WBC 13.9 (*)    RBC 3.00 (*)    Hemoglobin 9.3 (*)    HCT 24.8 (*)    MCHC 37.5 (*)    nRBC 0.4 (*)    Neutro Abs 9.8 (*)    Monocytes Absolute 1.1 (*)    Abs Immature Granulocytes 0.10 (*)    All other components within normal limits  RETICULOCYTES - Abnormal; Notable for the following components:   Retic Ct Pct 4.8 (*)    RBC. 3.01 (*)    Immature Retic Fract 31.4 (*)    All other components within normal limits      EKG   EKG Interpretation  Date/Time:  Friday December 31 2021 21:16:37 EDT Ventricular Rate:  95 PR Interval:  212 QRS Duration: 85 QT Interval:  343 QTC Calculation: 432 R Axis:   80 Text Interpretation: Sinus rhythm Prolonged PR interval Confirmed by Alvino Blood ([id number]) on 12/31/2021 10:16:23 PM          Medicines ordered and prescription drug management: Meds ordered this encounter  Medications   ketorolac (TORADOL) 15 MG/ML injection 15 mg   HYDROmorphone (DILAUDID) injection 2 mg   ondansetron (ZOFRAN) injection 4 mg   lactated ringers bolus 1,000 mL   HYDROmorphone (DILAUDID) injection 2 mg   ketorolac (TORADOL) 15 MG/ML injection 15 mg   diphenhydrAMINE (BENADRYL) injection 25 mg    -I have reviewed the patients home medicines and have made adjustments as needed    Cardiac Monitoring: The patient was maintained on a cardiac monitor.  I personally viewed and interpreted the cardiac monitored which showed an underlying  rhythm of: NSR  Social Determinants of Health:  Factors impacting patients care include: occasional smoker   Reevaluation: After the interventions noted above, I reevaluated the patient and found that they have :stayed the same  Co morbidities that complicate the patient evaluation  Past Medical History:  Diagnosis Date   Acute kidney injury (HCC) 01/18/2021   Acute pain of left shoulder    Cough 06/27/2020   Hypokalemia 06/27/2020   Hypoxia    Leukocytosis 09/03/2016   Opiate abuse, episodic (HCC) 09/25/2017   Opioid dependence in remission (HCC)    Right leg pain    Sickle cell crisis (HCC)       Dispostion: Pending at sign out.      Final Clinical Impression(s) / ED Diagnoses Final diagnoses:  None     This chart was dictated using voice recognition software.  Despite best efforts to proofread,  errors can occur which can change the documentation meaning.    Lonell Grandchild, MD 12/31/21 [phone]

## 2021-12-31 NOTE — Telephone Encounter (Signed)
Refill request oxycodone 10 mg immediate release.

## 2021-12-31 NOTE — ED Triage Notes (Signed)
SCC with pain in lower back. Tearful and unable to sit comfortably.   Discharged from [facility] this morning.

## 2021-12-31 NOTE — Telephone Encounter (Signed)
Pt requesting refill of Oxycodone 10mg  immediate release. States Dr. did not fill it for her

## 2021-12-31 NOTE — Discharge Summary (Signed)
Physician Discharge Summary   Patient: Lindsey [name] MRN: [id number] DOB: [birth year]  Admit date:     12/28/2021  Discharge date: {dischdate:26783}  Discharge Physician: Lonia Blood   PCP: Massie Maroon, FNP   Recommendations at discharge:  {Tip this will not be part of the note when signed- Example include specific recommendations for outpatient follow-up, pending tests to follow-up on. (Optional):26781}  ***  Discharge Diagnoses: Active Problems:   Chronic pain   Anemia of chronic disease   Sickle cell pain crisis (HCC)   Anxiety and depression  Resolved Problems:   * No resolved hospital problems. [facility] Course: No notes on file  Assessment and Plan: No notes have been filed under this hospital service. Service: Hospitalist     {Tip this will not be part of the note when signed Body mass index is 27.34 kg/m. , ,  (Optional):26781}  {(NOTE) Pain control PDMP Statment (Optional):26782} Consultants: *** Procedures performed: ***  Disposition: {Plan; Disposition:26390} Diet recommendation:  Discharge Diet Orders (From admission, onward)     Start     Ordered   12/31/21 0000  Diet - low sodium heart healthy        12/31/21 1053           {Diet_Plan:26776} DISCHARGE MEDICATION: Allergies as of 12/31/2021       Reactions   Ketamine Anxiety   Tachycardia        Medication List     TAKE these medications    ALPRAZolam 0.25 MG tablet Commonly known as: XANAX Take 1 tablet (0.25 mg total) by mouth 2 (two) times daily as needed for anxiety.   Benadryl Allergy 25 MG tablet Generic drug: diphenhydrAMINE Take 50 mg by mouth at bedtime as needed for sleep.   DULoxetine 20 MG capsule Commonly known as: Cymbalta Take 1 capsule (20 mg total) by mouth daily.   folic acid 1 MG tablet Commonly known as: FOLVITE Take 1 tablet (1 mg total) by mouth daily.   gabapentin 400 MG capsule Commonly known as: NEURONTIN Take 1 capsule (400 mg  total) by mouth 3 (three) times daily. What changed: See the new instructions.   ibuprofen 800 MG tablet Commonly known as: ADVIL Take 1 tablet (800 mg total) by mouth every 8 (eight) hours as needed for moderate pain.   Oxycodone HCl 10 MG Tabs Take 1 tablet (10 mg total) by mouth every 6 (six) hours as needed for up to 15 days (for moderate to severe pain).   oxyCODONE 10 mg 12 hr tablet Commonly known as: OXYCONTIN Take 1 tablet (10 mg total) by mouth every 12 (twelve) hours.        Discharge Exam: Filed Weights   12/28/21 1919  Weight: 63.5 kg   ***  Condition at discharge: {DC Condition:26389}  The results of significant diagnostics from this hospitalization (including imaging, microbiology, ancillary and laboratory) are listed below for reference.   Imaging Studies: No results found.  Microbiology: Results for orders placed or performed during the hospital encounter of 07/16/21  Resp Panel by RT-PCR (Flu A&B, Covid) Nasopharyngeal Swab     Status: None   Collection Time: 07/16/21 11:07 PM   Specimen: Nasopharyngeal Swab; Nasopharyngeal(NP) swabs in vial transport medium  Result Value Ref Range Status   SARS Coronavirus 2 by RT PCR NEGATIVE NEGATIVE Final    Comment: (NOTE) SARS-CoV-2 target nucleic acids are NOT DETECTED.  The SARS-CoV-2 RNA is generally detectable in upper respiratory specimens during the acute phase  of infection. The lowest concentration of SARS-CoV-2 viral copies this assay can detect is 138 copies/mL. A negative result does not preclude SARS-Cov-2 infection and should not be used as the sole basis for treatment or other patient management decisions. A negative result may occur with  improper specimen collection/handling, submission of specimen other than nasopharyngeal swab, presence of viral mutation(s) within the areas targeted by this assay, and inadequate number of viral copies(<138 copies/mL). A negative result must be combined  with clinical observations, patient history, and epidemiological information. The expected result is Negative.  Fact Sheet for Patients:  BloggerCourse.com  Fact Sheet for Healthcare Providers:  SeriousBroker.it  This test is no t yet approved or cleared by the [place] FDA and  has been authorized for detection and/or diagnosis of SARS-CoV-2 by FDA under an Emergency Use Authorization (EUA). This EUA will remain  in effect (meaning this test can be used) for the duration of the COVID-19 declaration under Section 564(b)(1) of the Act, 21 U.S.C.section 360bbb-3(b)(1), unless the authorization is terminated  or revoked sooner.       Influenza A by PCR NEGATIVE NEGATIVE Final   Influenza B by PCR NEGATIVE NEGATIVE Final    Comment: (NOTE) The Xpert Xpress SARS-CoV-2/FLU/RSV plus assay is intended as an aid in the diagnosis of influenza from Nasopharyngeal swab specimens and should not be used as a sole basis for treatment. Nasal washings and aspirates are unacceptable for Xpert Xpress SARS-CoV-2/FLU/RSV testing.  Fact Sheet for Patients: BloggerCourse.com  Fact Sheet for Healthcare Providers: SeriousBroker.it  This test is not yet approved or cleared by the [place] FDA and has been authorized for detection and/or diagnosis of SARS-CoV-2 by FDA under an Emergency Use Authorization (EUA). This EUA will remain in effect (meaning this test can be used) for the duration of the COVID-19 declaration under Section 564(b)(1) of the Act, 21 U.S.C. section 360bbb-3(b)(1), unless the authorization is terminated or revoked.  Performed at [facility], [address], [place], [place] [address]     Labs: CBC: Recent Labs  Lab 12/28/21 1947 12/29/21 0920 12/31/21 0550  WBC 9.9 9.2 9.0  NEUTROABS 5.2  --  3.6  HGB 10.8* 9.0* 8.5*  HCT 29.0* 23.6* 22.7*   MCV 82.9 82.2 83.2  PLT 429* 348 310   Basic Metabolic Panel: Recent Labs  Lab 12/28/21 1947 12/31/21 0550  NA 142 141  K 3.7 4.0  CL 111 112*  CO2 25 24  GLUCOSE 101* 89  BUN 10 14  CREATININE 0.90 0.81  CALCIUM 9.3 8.5*   Liver Function Tests: Recent Labs  Lab 12/28/21 1947 12/31/21 0550  AST 16 18  ALT 13 15  ALKPHOS 45 38  BILITOT 1.3* 1.0  PROT 7.5 5.8*  ALBUMIN 4.4 3.4*   CBG: No results for input(s): "GLUCAP" in the last 168 hours.  Discharge time spent: {LESS THAN/GREATER PNTI:14431} 30 minutes.  SignedLonia Blood, MD [facility] 12/31/2021

## 2022-01-01 DIAGNOSIS — Z79899 Other long term (current) drug therapy: Secondary | ICD-10-CM | POA: Diagnosis not present

## 2022-01-01 DIAGNOSIS — F17290 Nicotine dependence, other tobacco product, uncomplicated: Secondary | ICD-10-CM | POA: Diagnosis present

## 2022-01-01 DIAGNOSIS — F419 Anxiety disorder, unspecified: Secondary | ICD-10-CM | POA: Diagnosis present

## 2022-01-01 DIAGNOSIS — D638 Anemia in other chronic diseases classified elsewhere: Secondary | ICD-10-CM | POA: Diagnosis present

## 2022-01-01 DIAGNOSIS — F17210 Nicotine dependence, cigarettes, uncomplicated: Secondary | ICD-10-CM | POA: Diagnosis present

## 2022-01-01 DIAGNOSIS — D5700 Hb-SS disease with crisis, unspecified: Secondary | ICD-10-CM

## 2022-01-01 DIAGNOSIS — Z888 Allergy status to other drugs, medicaments and biological substances status: Secondary | ICD-10-CM | POA: Diagnosis not present

## 2022-01-01 DIAGNOSIS — Z9049 Acquired absence of other specified parts of digestive tract: Secondary | ICD-10-CM | POA: Diagnosis not present

## 2022-01-01 DIAGNOSIS — F32A Depression, unspecified: Secondary | ICD-10-CM | POA: Diagnosis present

## 2022-01-01 DIAGNOSIS — G894 Chronic pain syndrome: Secondary | ICD-10-CM | POA: Diagnosis present

## 2022-01-01 LAB — MAGNESIUM: Magnesium: 1.9 mg/dL (ref 1.7–2.4)

## 2022-01-01 LAB — CBC
HCT: 22.8 % — ABNORMAL LOW (ref 36.0–46.0)
Hemoglobin: 8.4 g/dL — ABNORMAL LOW (ref 12.0–15.0)
MCH: 30.9 pg (ref 26.0–34.0)
MCHC: 36.8 g/dL — ABNORMAL HIGH (ref 30.0–36.0)
MCV: 83.8 fL (ref 80.0–100.0)
Platelets: 305 10*3/uL (ref 150–400)
RBC: 2.72 MIL/uL — ABNORMAL LOW (ref 3.87–5.11)
RDW: 15.4 % (ref 11.5–15.5)
WBC: 11.7 10*3/uL — ABNORMAL HIGH (ref 4.0–10.5)
nRBC: 0.8 % — ABNORMAL HIGH (ref 0.0–0.2)

## 2022-01-01 LAB — BASIC METABOLIC PANEL
Anion gap: 5 (ref 5–15)
BUN: 10 mg/dL (ref 6–20)
CO2: 24 mmol/L (ref 22–32)
Calcium: 8.5 mg/dL — ABNORMAL LOW (ref 8.9–10.3)
Chloride: 114 mmol/L — ABNORMAL HIGH (ref 98–111)
Creatinine, Ser: 0.83 mg/dL (ref 0.44–1.00)
GFR, Estimated: >60 mL/min (ref >60)
Glucose, Bld: 89 mg/dL (ref 70–99)
Potassium: 3.9 mmol/L (ref 3.5–5.1)
Sodium: 143 mmol/L (ref 135–145)

## 2022-01-01 LAB — PHOSPHORUS: Phosphorus: 3.9 mg/dL (ref 2.5–4.6)

## 2022-01-01 MED ORDER — HYDROMORPHONE HCL 2 MG/ML IJ SOLN
1.0000 mg | INTRAMUSCULAR | Status: AC | PRN
  Administered 2022-01-01: 1 mg via INTRAVENOUS
  Filled 2022-01-01: qty 1

## 2022-01-01 MED ORDER — DULOXETINE HCL 20 MG PO CPEP
20.0000 mg | ORAL_CAPSULE | Freq: Every day | ORAL | Status: DC
  Administered 2022-01-01 – 2022-01-03 (×3): 20 mg via ORAL
  Filled 2022-01-01 (×4): qty 1

## 2022-01-01 MED ORDER — NALOXONE HCL 0.4 MG/ML IJ SOLN: 0.4000 mg | INTRAMUSCULAR | Status: DC | PRN

## 2022-01-01 MED ORDER — FOLIC ACID 1 MG PO TABS
1.0000 mg | ORAL_TABLET | Freq: Every day | ORAL | Status: DC
  Administered 2022-01-01 – 2022-01-03 (×3): 1 mg via ORAL
  Filled 2022-01-01 (×3): qty 1

## 2022-01-01 MED ORDER — DIPHENHYDRAMINE HCL 25 MG PO CAPS
25.0000 mg | ORAL_CAPSULE | ORAL | Status: DC | PRN
  Administered 2022-01-01: 25 mg via ORAL
  Filled 2022-01-01: qty 1

## 2022-01-01 MED ORDER — HYDROMORPHONE HCL 2 MG/ML IJ SOLN: 1.0000 mg | INTRAMUSCULAR | Status: DC | PRN

## 2022-01-01 MED ORDER — SODIUM CHLORIDE 0.45 % IV SOLN
INTRAVENOUS | Status: AC
  Administered 2022-01-02: 1000 mL via INTRAVENOUS

## 2022-01-01 MED ORDER — HYDROMORPHONE 1 MG/ML IV SOLN
INTRAVENOUS | Status: DC
  Administered 2022-01-01: 4 mg via INTRAVENOUS
  Administered 2022-01-01: 2.5 mg via INTRAVENOUS
  Administered 2022-01-02: 2.2 mg via INTRAVENOUS
  Administered 2022-01-02: 1 mg via INTRAVENOUS
  Administered 2022-01-02: 3.5 mg via INTRAVENOUS
  Administered 2022-01-02: 4.5 mg via INTRAVENOUS
  Administered 2022-01-03: 0.5 mg via INTRAVENOUS
  Administered 2022-01-03: 0.1 mg via INTRAVENOUS
  Filled 2022-01-01 (×2): qty 30

## 2022-01-01 MED ORDER — GABAPENTIN 400 MG PO CAPS
400.0000 mg | ORAL_CAPSULE | Freq: Three times a day (TID) | ORAL | Status: DC
  Administered 2022-01-01 – 2022-01-03 (×7): 400 mg via ORAL
  Filled 2022-01-01 (×7): qty 1

## 2022-01-01 MED ORDER — HYDROMORPHONE HCL 2 MG/ML IJ SOLN
1.0000 mg | INTRAMUSCULAR | Status: AC
  Administered 2022-01-01: 1 mg via INTRAVENOUS
  Filled 2022-01-01: qty 1

## 2022-01-01 MED ORDER — OXYCODONE HCL ER 10 MG PO T12A
10.0000 mg | EXTENDED_RELEASE_TABLET | Freq: Two times a day (BID) | ORAL | Status: DC
  Administered 2022-01-01 – 2022-01-03 (×6): 10 mg via ORAL
  Filled 2022-01-01 (×6): qty 1

## 2022-01-01 MED ORDER — ONDANSETRON HCL 4 MG/2ML IJ SOLN
4.0000 mg | Freq: Four times a day (QID) | INTRAMUSCULAR | Status: DC | PRN
Start: 2022-01-01 — End: 2022-01-03

## 2022-01-01 MED ORDER — ENOXAPARIN SODIUM 40 MG/0.4ML IJ SOSY
40.0000 mg | PREFILLED_SYRINGE | INTRAMUSCULAR | Status: DC
  Administered 2022-01-02 – 2022-01-03 (×2): 40 mg via SUBCUTANEOUS
  Filled 2022-01-01 (×3): qty 0.4

## 2022-01-01 MED ORDER — HYDROMORPHONE HCL 2 MG/ML IJ SOLN
2.0000 mg | INTRAMUSCULAR | Status: DC | PRN
  Administered 2022-01-01 (×3): 2 mg via INTRAVENOUS
  Filled 2022-01-01 (×4): qty 1

## 2022-01-01 MED ORDER — SODIUM CHLORIDE 0.9% FLUSH
9.0000 mL | INTRAVENOUS | Status: DC | PRN
Start: 2022-01-01 — End: 2022-01-03

## 2022-01-01 MED ORDER — HYDROMORPHONE 1 MG/ML IV SOLN: INTRAVENOUS | Status: DC

## 2022-01-01 MED ORDER — OXYCODONE HCL 5 MG PO TABS
10.0000 mg | ORAL_TABLET | Freq: Four times a day (QID) | ORAL | Status: DC | PRN
  Administered 2022-01-01 – 2022-01-02 (×4): 10 mg via ORAL
  Filled 2022-01-01 (×4): qty 2

## 2022-01-01 MED ORDER — SENNOSIDES-DOCUSATE SODIUM 8.6-50 MG PO TABS
2.0000 | ORAL_TABLET | Freq: Two times a day (BID) | ORAL | Status: DC
Start: 2022-01-01 — End: 2022-01-03
  Administered 2022-01-01 – 2022-01-03 (×6): 2 via ORAL
  Filled 2022-01-01 (×6): qty 2

## 2022-01-01 NOTE — ED Notes (Signed)
Two light green tubes, one purple tube sent to lab. SQ printer not working. Tubes labeled with white patient label.

## 2022-01-01 NOTE — ED Notes (Signed)
Nurse has contacted attending to request prn pain medication order as pca pump not available in ED. Awaiting response

## 2022-01-01 NOTE — Hospital Course (Signed)
Patient was admitted with sickle cell pain crisis.  Initiated on Dilaudid PCA, Toradol, IV fluids, pleuritic pain management.  Patient's pain went from 9 out of 10 down to the 3 out of 10.  She asked to be discharged home.  She is out of all her home regimen.  I have refilled all her chronic medications both pain and known pain.  She has a short acting oxycodone which is being sent from the clinic today.  Patient will resume her home regimen.  She was doing well at the time of discharge.

## 2022-01-01 NOTE — ED Notes (Signed)
Patient ambulated to bathroom with assistance of NT at this time.

## 2022-01-01 NOTE — ED Notes (Signed)
This nurse informed my floor nurse that room not quite ready yet for patient transport

## 2022-01-01 NOTE — H&P (Signed)
History and Physical  Lindsey [name] CBJ:[id number] DOB: [birth year] DOA: 12/31/2021  Referring physician: Dr. Blinda Leatherwood, EDP  PCP: Massie Maroon, FNP  Outpatient Specialists: Sickle cell team. Patient coming from: Home.  Chief Complaint: Sickle cell pain.  HPI: Lindsey [name] is a 51 y.o. female with medical history significant for sickle cell disease, chronic pain syndrome, chronic opiate use/dependence and tolerance, anemia of chronic disease, chronic anxiety/depression, who presented to [facility] ED with complaints of back pain.  Symptoms are similar to her previous sickle cell pain crisis.  Recently admitted for the same and discharged few days ago.    In the ED received IV narcotics with minimal improvement of her pain.  EDP requested admission for further management.  The patient was admitted by the hospitalist service, [facility].  ED Course: Tmax 98.9.  BP 127/72, pulse 86, respiration rate 21, O2 saturation 98% on room air.  Lab studies remarkable for WBC 13.9, hemoglobin 9.3 uptrending from 8.5.  Review of Systems: Review of systems as noted in the HPI. All other systems reviewed and are negative.   Past Medical History:  Diagnosis Date   Acute kidney injury (HCC) 01/18/2021   Acute pain of left shoulder    Cough 06/27/2020   Hypokalemia 06/27/2020   Hypoxia    Leukocytosis 09/03/2016   Opiate abuse, episodic (HCC) 09/25/2017   Opioid dependence in remission (HCC)    Right leg pain    Sickle cell crisis [facility])    Past Surgical History:  Procedure Laterality Date   APPENDECTOMY     CESAREAN SECTION     OTHER SURGICAL HISTORY     c-section    Social History:  reports that she has been smoking cigars and cigarettes. She has never used smokeless tobacco. She reports current alcohol use. She reports that she does not use drugs.   Allergies  Allergen Reactions   Ketamine Anxiety    Tachycardia    Family History  Problem Relation Age of Onset   Stroke Neg Hx         none that she knows of    Seizures Neg Hx       Prior to Admission medications   Medication Sig Start Date End Date Taking? Authorizing Provider  ALPRAZolam (XANAX) 0.25 MG tablet Take 1 tablet (0.25 mg total) by mouth 2 (two) times daily as needed for anxiety. 12/31/21  Yes Rometta Emery, MD  diphenhydrAMINE (BENADRYL ALLERGY) 25 MG tablet Take 50 mg by mouth at bedtime as needed for sleep.   Yes [provider]  DULoxetine (CYMBALTA) 20 MG capsule Take 1 capsule (20 mg total) by mouth daily. 12/31/21 12/31/22 Yes Rometta Emery, MD  folic acid (FOLVITE) 1 MG tablet Take 1 tablet (1 mg total) by mouth daily. 12/31/21 12/31/22 Yes Rometta Emery, MD  gabapentin (NEURONTIN) 400 MG capsule Take 1 capsule (400 mg total) by mouth 3 (three) times daily. 12/31/21  Yes Rometta Emery, MD  ibuprofen (ADVIL) 800 MG tablet Take 1 tablet (800 mg total) by mouth every 8 (eight) hours as needed for moderate pain. 12/31/21 01/30/22 Yes Rometta Emery, MD  oxyCODONE (OXYCONTIN) 10 mg 12 hr tablet Take 1 tablet (10 mg total) by mouth every 12 (twelve) hours. 12/31/21 01/30/22 Yes Rometta Emery, MD  Oxycodone HCl 10 MG TABS Take 1 tablet (10 mg total) by mouth every 6 (six) hours as needed for up to 15 days (for moderate to severe pain). 12/18/21 01/02/22 Yes  Massie Maroon, FNP    Physical Exam: BP 127/72   Pulse 79   Temp 98.8 F (37.1 C) (Oral)   Resp 15   Ht 5' (1.524 m)   Wt 63.5 kg   SpO2 98%   BMI 27.34 kg/m   General: 51 y.o. year-old female well developed well nourished in no acute distress.  Alert and oriented x3. Cardiovascular: Regular rate and rhythm with no rubs or gallops.  No thyromegaly or JVD noted.  No lower extremity edema. 2/4 pulses in all 4 extremities. Respiratory: Clear to auscultation with no wheezes or rales. Good inspiratory effort. Abdomen: Soft diffusely tender nondistended with normal bowel sounds x4 quadrants. Muskuloskeletal: No cyanosis, clubbing  or edema noted bilaterally Neuro: CN II-XII intact, strength, sensation, reflexes Skin: No ulcerative lesions noted or rashes Psychiatry: Judgement and insight appear normal. Mood is appropriate for condition and setting          Labs on Admission:  Basic Metabolic Panel: Recent Labs  Lab 12/28/21 1947 12/31/21 0550 12/31/21 2211  NA 142 141 143  K 3.7 4.0 3.9  CL 111 112* 113*  CO2 25 24 24   GLUCOSE 101* 89 93  BUN 10 14 12   CREATININE 0.90 0.81 0.91  CALCIUM 9.3 8.5* 8.7*   Liver Function Tests: Recent Labs  Lab 12/28/21 1947 12/31/21 0550  AST 16 18  ALT 13 15  ALKPHOS 45 38  BILITOT 1.3* 1.0  PROT 7.5 5.8*  ALBUMIN 4.4 3.4*   No results for input(s): "LIPASE", "AMYLASE" in the last 168 hours. No results for input(s): "AMMONIA" in the last 168 hours. CBC: Recent Labs  Lab 12/28/21 1947 12/29/21 0920 12/31/21 0550 12/31/21 2211  WBC 9.9 9.2 9.0 13.9*  NEUTROABS 5.2  --  3.6 9.8*  HGB 10.8* 9.0* 8.5* 9.3*  HCT 29.0* 23.6* 22.7* 24.8*  MCV 82.9 82.2 83.2 82.7  PLT 429* 348 310 338   Cardiac Enzymes: No results for input(s): "CKTOTAL", "CKMB", "CKMBINDEX", "TROPONINI" in the last 168 hours.  BNP (last 3 results) No results for input(s): "BNP" in the last 8760 hours.  ProBNP (last 3 results) No results for input(s): "PROBNP" in the last 8760 hours.  CBG: No results for input(s): "GLUCAP" in the last 168 hours.  Radiological Exams on Admission: No results found.  EKG: I independently viewed the EKG done and my findings are as followed: Sinus rhythm rate of 95.  Nonspecific ST-T changes.  QTc 432.  Assessment/Plan Present on Admission:  Sickle cell pain crisis (HCC)  Active Problems:   Sickle cell pain crisis (HCC)  Sickle cell pain crisis Recently admitted for the same and discharged a few days ago PCA Dilaudid pump. IV fluid hydration O2 supplementation as needed Closely monitor on telemetry  Chronic pain syndrome Restart home regimen  while awaiting PCA Dilaudid pump. IV narcotic as needed, Dilaudid, for severe pain Closely monitor on telemetry  Anemia of chronic disease Hemoglobin at baseline. Monitor H&H.  Chronic anxiety/depression Resume home regimen.     DVT prophylaxis: Subcu Lovenox daily  Code Status: Full code  Family Communication: None at bedside  Disposition Plan: Admitted to telemetry unit  Consults called: None.  Admission status: Observation status.   Status is: Observation    01/02/22 MD [facility] Pager [phone]  If 7PM-7AM, please contact night-coverage [email or web address] Password [email or web address]  01/01/2022, 1:28 AM

## 2022-01-01 NOTE — ED Notes (Signed)
Patient requested benadryl due to itching

## 2022-01-01 NOTE — ED Notes (Signed)
Provider notified that patient refused lovenox injection.

## 2022-01-02 LAB — COMPREHENSIVE METABOLIC PANEL
ALT: 25 U/L (ref 0–44)
AST: 34 U/L (ref 15–41)
Albumin: 3.7 g/dL (ref 3.5–5.0)
Alkaline Phosphatase: 43 U/L (ref 38–126)
Anion gap: 5 (ref 5–15)
BUN: 8 mg/dL (ref 6–20)
CO2: 24 mmol/L (ref 22–32)
Calcium: 8.4 mg/dL — ABNORMAL LOW (ref 8.9–10.3)
Chloride: 112 mmol/L — ABNORMAL HIGH (ref 98–111)
Creatinine, Ser: 0.65 mg/dL (ref 0.44–1.00)
GFR, Estimated: >60 mL/min (ref >60)
Glucose, Bld: 95 mg/dL (ref 70–99)
Potassium: 4 mmol/L (ref 3.5–5.1)
Sodium: 141 mmol/L (ref 135–145)
Total Bilirubin: 1.7 mg/dL — ABNORMAL HIGH (ref 0.3–1.2)
Total Protein: 6.3 g/dL — ABNORMAL LOW (ref 6.5–8.1)

## 2022-01-02 LAB — CBC WITH DIFFERENTIAL/PLATELET
Abs Immature Granulocytes: 0.03 10*3/uL (ref 0.00–0.07)
Basophils Absolute: 0.1 10*3/uL (ref 0.0–0.1)
Basophils Relative: 1 %
Eosinophils Absolute: 0.4 10*3/uL (ref 0.0–0.5)
Eosinophils Relative: 5 %
HCT: 23.3 % — ABNORMAL LOW (ref 36.0–46.0)
Hemoglobin: 8.5 g/dL — ABNORMAL LOW (ref 12.0–15.0)
Immature Granulocytes: 0 %
Lymphocytes Relative: 36 %
Lymphs Abs: 3.3 10*3/uL (ref 0.7–4.0)
MCH: 31 pg (ref 26.0–34.0)
MCHC: 36.5 g/dL — ABNORMAL HIGH (ref 30.0–36.0)
MCV: 85 fL (ref 80.0–100.0)
Monocytes Absolute: 0.8 10*3/uL (ref 0.1–1.0)
Monocytes Relative: 9 %
Neutro Abs: 4.6 10*3/uL (ref 1.7–7.7)
Neutrophils Relative %: 49 %
Platelets: 293 10*3/uL (ref 150–400)
RBC: 2.74 MIL/uL — ABNORMAL LOW (ref 3.87–5.11)
RDW: 15.9 % — ABNORMAL HIGH (ref 11.5–15.5)
WBC: 9.2 10*3/uL (ref 4.0–10.5)
nRBC: 1.6 % — ABNORMAL HIGH (ref 0.0–0.2)

## 2022-01-02 MED ORDER — ACETAMINOPHEN 325 MG PO TABS
650.0000 mg | ORAL_TABLET | Freq: Once | ORAL | Status: AC
  Administered 2022-01-02: 650 mg via ORAL
  Filled 2022-01-02: qty 2

## 2022-01-02 MED ORDER — ACETAMINOPHEN 325 MG PO TABS
650.0000 mg | ORAL_TABLET | Freq: Once | ORAL | Status: AC
Start: 2022-01-02 — End: 2022-01-02
  Administered 2022-01-02: 650 mg via ORAL
  Filled 2022-01-02: qty 2

## 2022-01-02 NOTE — Progress Notes (Signed)
SICKLE CELL SERVICE PROGRESS NOTE  Lindsey [name] MEQ:[id number] DOB: 09-06-1970 DOA: 12/31/2021 PCP: Massie Maroon, FNP  Assessment/Plan: Active Problems:   Sickle cell pain crisis (HCC)  Sickle cell pain crisis: Patient is currently on Dilaudid PCA, Toradol and IV fluids.  She is continue to have pain but better.  Rate is down to 6 out of 10.  No fever or chills no nausea vomiting or diarrhea Anemia of chronic disease: Hemoglobin is at baseline.  Continue to monitor Chronic pain syndrome: Continue home medications. Anxiety disorder: Continue home regimen  Code Status: Full code Family Communication: No family at bedside Disposition Plan: Home  [facility]  Pager [phone]. If 7PM-7AM, please contact night-coverage.  01/02/2022, 4:29 PM  LOS: 1 day   Brief narrative: Lindsey [name] is a 51 y.o. female with medical history significant of sickle cell disease, chronic pain syndrome, opiate dependence and tolerance, anemia of chronic disease, anxiety/depression presenting to the ED with sickle cell pain crisis/low back pain.  Slightly tachycardic on arrival but afebrile.  Tachycardia subsequently resolved.  Labs showing no leukocytosis, hemoglobin 10.8 (stable), platelet count 429k. Patient was given multiple rounds of IV Dilaudid and continued to have severe pain.  [facility] called to admit for sickle cell pain crisis.   Patient is endorsing pain in her low back consistent with prior sickle cell crisis episodes.  No falls or injuries to her back.  No fevers, lower extremity weakness, or bowel/bladder dysfunction.  Pain started 3-4 days ago.  She is taking her home medications including oxycodone 10 mg every 12 hours and Oxy IR 10 mg every 6 hours but continued to have severe pain.  She had 1 episode of vomiting prior to coming into the ED but has not vomited since then and does not feel nauseous at present.  Denies abdominal pain, constipation, or diarrhea.  Denies cough, shortness  of breath, or chest pain.   Consultants: None  Procedures: None  Antibiotics: None  HPI/Subjective: Patient's pain is down to 7 out of 10.  No fever no chills no nausea vomiting or diarrhea  Objective: Vitals:   01/02/22 1035 01/02/22 1208 01/02/22 1343 01/02/22 1606  BP: 108/68  108/65   Pulse: 65  65   Resp: 16 16 16 14   Temp: 98.6 F (37 C)  98.5 F (36.9 C)   TempSrc: Oral  Oral   SpO2: 95% 96% 92% 94%  Weight:      Height:       Weight change:   Intake/Output Summary (Last 24 hours) at 01/02/2022 1629 Last data filed at 01/02/2022 0900 Gross per 24 hour  Intake 120 ml  Output --  Net 120 ml     General: Alert, awake, oriented x3, in no acute distress.  HEENT: [place]/AT PEERL, EOMI Neck: Trachea midline,  no masses, no thyromegal,y no JVD, no carotid bruit OROPHARYNX:  Moist, No exudate/ erythema/lesions.  Heart: Regular rate and rhythm, without murmurs, rubs, gallops, PMI non-displaced, no heaves or thrills on palpation.  Lungs: Clear to auscultation, no wheezing or rhonchi noted. No increased vocal fremitus resonant to percussion  Abdomen: Soft, nontender, nondistended, positive bowel sounds, no masses no hepatosplenomegaly noted..  Neuro: No focal neurological deficits noted cranial nerves II through XII grossly intact. DTRs 2+ bilaterally upper and lower extremities. Strength 5 out of 5 in bilateral upper and lower extremities. Musculoskeletal: No warm swelling or erythema around joints, no spinal tenderness noted. Psychiatric: Patient alert and oriented x3, good insight and  cognition, good recent to remote recall. Lymph node survey: No cervical axillary or inguinal lymphadenopathy noted.   Data Reviewed: Basic Metabolic Panel: Recent Labs  Lab 12/28/21 1947 12/31/21 0550 12/31/21 2211 01/01/22 0630 01/02/22 0717  NA 142 141 143 143 141  K 3.7 4.0 3.9 3.9 4.0  CL 111 112* 113* 114* 112*  CO2 25 24 24 24 24   GLUCOSE 101* 89 93 89 95  BUN 10 14 12 10  8   CREATININE 0.90 0.81 0.91 0.83 0.65  CALCIUM 9.3 8.5* 8.7* 8.5* 8.4*  MG  --   --   --  1.9  --   PHOS  --   --   --  3.9  --     Liver Function Tests: Recent Labs  Lab 12/28/21 1947 12/31/21 0550 01/02/22 0717  AST 16 18 34  ALT 13 15 25   ALKPHOS 45 38 43  BILITOT 1.3* 1.0 1.7*  PROT 7.5 5.8* 6.3*  ALBUMIN 4.4 3.4* 3.7    No results for input(s): "LIPASE", "AMYLASE" in the last 168 hours. No results for input(s): "AMMONIA" in the last 168 hours. CBC: Recent Labs  Lab 12/28/21 1947 12/29/21 0920 12/31/21 0550 12/31/21 2211 01/01/22 0630 01/02/22 0717  WBC 9.9 9.2 9.0 13.9* 11.7* 9.2  NEUTROABS 5.2  --  3.6 9.8*  --  4.6  HGB 10.8* 9.0* 8.5* 9.3* 8.4* 8.5*  HCT 29.0* 23.6* 22.7* 24.8* 22.8* 23.3*  MCV 82.9 82.2 83.2 82.7 83.8 85.0  PLT 429* 348 310 338 305 293    Cardiac Enzymes: No results for input(s): "CKTOTAL", "CKMB", "CKMBINDEX", "TROPONINI" in the last 168 hours. BNP (last 3 results) No results for input(s): "BNP" in the last 8760 hours.  ProBNP (last 3 results) No results for input(s): "PROBNP" in the last 8760 hours.  CBG: No results for input(s): "GLUCAP" in the last 168 hours.  No results found for this or any previous visit (from the past 240 hour(s)).   Studies: No results found.  Scheduled Meds:  DULoxetine  20 mg Oral Daily   enoxaparin (LOVENOX) injection  40 mg Subcutaneous Q24H   folic acid  1 mg Oral Daily   gabapentin  400 mg Oral TID   HYDROmorphone   Intravenous Q4H   oxyCODONE  10 mg Oral Q12H   senna-docusate  2 tablet Oral BID   Continuous Infusions:    Active Problems:   Sickle cell pain crisis (HCC)

## 2022-01-03 LAB — COMPREHENSIVE METABOLIC PANEL
ALT: 33 U/L (ref 0–44)
AST: 34 U/L (ref 15–41)
Albumin: 4.1 g/dL (ref 3.5–5.0)
Alkaline Phosphatase: 45 U/L (ref 38–126)
Anion gap: 7 (ref 5–15)
BUN: 7 mg/dL (ref 6–20)
CO2: 25 mmol/L (ref 22–32)
Calcium: 9.5 mg/dL (ref 8.9–10.3)
Chloride: 112 mmol/L — ABNORMAL HIGH (ref 98–111)
Creatinine, Ser: 0.78 mg/dL (ref 0.44–1.00)
GFR, Estimated: >60 mL/min (ref >60)
Glucose, Bld: 102 mg/dL — ABNORMAL HIGH (ref 70–99)
Potassium: 4.1 mmol/L (ref 3.5–5.1)
Sodium: 144 mmol/L (ref 135–145)
Total Bilirubin: 2 mg/dL — ABNORMAL HIGH (ref 0.3–1.2)
Total Protein: 7.3 g/dL (ref 6.5–8.1)

## 2022-01-03 LAB — CBC WITH DIFFERENTIAL/PLATELET
Abs Immature Granulocytes: 0.03 10*3/uL (ref 0.00–0.07)
Basophils Absolute: 0 10*3/uL (ref 0.0–0.1)
Basophils Relative: 0 %
Eosinophils Absolute: 0.2 10*3/uL (ref 0.0–0.5)
Eosinophils Relative: 2 %
HCT: 26.1 % — ABNORMAL LOW (ref 36.0–46.0)
Hemoglobin: 9.7 g/dL — ABNORMAL LOW (ref 12.0–15.0)
Immature Granulocytes: 0 %
Lymphocytes Relative: 31 %
Lymphs Abs: 3.1 10*3/uL (ref 0.7–4.0)
MCH: 31.1 pg (ref 26.0–34.0)
MCHC: 37.2 g/dL — ABNORMAL HIGH (ref 30.0–36.0)
MCV: 83.7 fL (ref 80.0–100.0)
Monocytes Absolute: 0.4 10*3/uL (ref 0.1–1.0)
Monocytes Relative: 5 %
Neutro Abs: 6.1 10*3/uL (ref 1.7–7.7)
Neutrophils Relative %: 62 %
Platelets: 334 10*3/uL (ref 150–400)
RBC: 3.12 MIL/uL — ABNORMAL LOW (ref 3.87–5.11)
RDW: 15.7 % — ABNORMAL HIGH (ref 11.5–15.5)
WBC: 9.9 10*3/uL (ref 4.0–10.5)
nRBC: 1 % — ABNORMAL HIGH (ref 0.0–0.2)

## 2022-01-03 MED ORDER — OXYCODONE HCL 10 MG PO TABS: 10.0000 mg | ORAL_TABLET | Freq: Four times a day (QID) | ORAL | 0 refills | Status: DC | PRN

## 2022-01-03 NOTE — Progress Notes (Signed)
.   Transition of Care [facility]) Screening Note   Patient Details  Name: Lindsey [name] Date of Birth: Mar 27, 1971   Transition of Care [facility]) CM/SW Contact:    Larrie Kass, LCSW Phone Number: 01/03/2022, 12:42 PM    Transition of Care Department [facility]) has reviewed patient and no TOC needs have been identified at this time. We will continue to monitor patient advancement through interdisciplinary progression rounds. If new patient transition needs arise, please place a TOC consult.

## 2022-01-03 NOTE — Discharge Summary (Signed)
DISCHARGE SUMMARY  Lindsey [name]  MR#: [id number]  DOB:Aug 20, 1970  Date of Admission: 12/31/2021 Date of Discharge: 01/03/2022  Attending Physician:Bertrand Vowels,LAWAL  Patient's WVP:[id number], Rosalene Billings, FNP  Consults:  None  Discharge Diagnoses: Present on Admission:  Sickle cell pain crisis (HCC)     Allergies as of 01/03/2022       Reactions   Ketamine Anxiety   Tachycardia        Medication List     TAKE these medications    ALPRAZolam 0.25 MG tablet Commonly known as: XANAX Take 1 tablet (0.25 mg total) by mouth 2 (two) times daily as needed for anxiety.   Benadryl Allergy 25 MG tablet Generic drug: diphenhydrAMINE Take 50 mg by mouth at bedtime as needed for sleep.   DULoxetine 20 MG capsule Commonly known as: Cymbalta Take 1 capsule (20 mg total) by mouth daily.   folic acid 1 MG tablet Commonly known as: FOLVITE Take 1 tablet (1 mg total) by mouth daily.   gabapentin 400 MG capsule Commonly known as: NEURONTIN Take 1 capsule (400 mg total) by mouth 3 (three) times daily.   ibuprofen 800 MG tablet Commonly known as: ADVIL Take 1 tablet (800 mg total) by mouth every 8 (eight) hours as needed for moderate pain.   oxyCODONE 10 mg 12 hr tablet Commonly known as: OXYCONTIN Take 1 tablet (10 mg total) by mouth every 12 (twelve) hours.   Oxycodone HCl 10 MG Tabs Take 1 tablet (10 mg total) by mouth every 6 (six) hours as needed for up to 15 days (for moderate to severe pain).          Hospital Course: Present on Admission:  Sickle cell pain crisis [facility]) : Patient was admitted with sickle cell pain crisis.  Was having significant pain at 10 out of 10.  She was just discharged on the same day but came back with the pain.  Patient readmitted and was placed on Dilaudid PCA with Toradol and IV fluids.  Over the previous days she did much better.  Pain was controlled ultimately.  Patient was discharged home on her home regimen.  Denied any fever or  chills no nausea vomiting or diarrhea.  Patient was overall doing better at time of discharge.  She is to resume her home regimen.   Day of Discharge BP 102/62 (BP Location: Left Arm)   Pulse 62   Temp 98.9 F (37.2 C) (Oral)   Resp 14   Ht 5' (1.524 m)   Wt 63.5 kg   SpO2 96%   BMI 27.34 kg/m   Physical Exam: General: Alert, awake, oriented x3, in no acute distress.  HEENT: [place]/AT PEERL, EOMI Neck: Trachea midline,  no masses, no thyromegal,y no JVD, no carotid bruit OROPHARYNX:  Moist, No exudate/ erythema/lesions.  Heart: Regular rate and rhythm, without murmurs, rubs, gallops, PMI non-displaced, no heaves or thrills on palpation.  Lungs: Clear to auscultation, no wheezing or rhonchi noted. No increased vocal fremitus resonant to percussion  Abdomen: Soft, nontender, nondistended, positive bowel sounds, no masses no hepatosplenomegaly noted..  Neuro:Right facial tenderness No focal neurological deficits noted cranial nerves II through XII grossly intact. DTRs 2+ bilaterally upper and lower extremities. Strength 5 out of 5 in bilateral upper and lower extremities. Musculoskeletal: No warm swelling or erythema around joints, no spinal tenderness noted. Psychiatric: Patient alert and oriented x3, good insight and cognition, good recent to remote recall. Lymph node survey: No cervical axillary or inguinal lymphadenopathy noted.  No results found for this or any previous visit (from the past 24 hour(s)).  Disposition: Home   Follow-up Appts: Discharge Instructions     Diet - low sodium heart healthy   Complete by: As directed    Increase activity slowly   Complete by: As directed        Follow-up with Dr. PCP, in 2 weeks.   Tests Needing Follow-up: None  Signed: Charleene Callegari,LAWAL 01/03/2022, 11:44 AM

## 2022-01-17 ENCOUNTER — Telehealth: Payer: Self-pay | Admitting: Family Medicine

## 2022-01-17 ENCOUNTER — Telehealth: Payer: Self-pay

## 2022-01-17 ENCOUNTER — Other Ambulatory Visit: Payer: Self-pay | Admitting: Family Medicine

## 2022-01-17 DIAGNOSIS — G894 Chronic pain syndrome: Secondary | ICD-10-CM

## 2022-01-17 DIAGNOSIS — D5700 Hb-SS disease with crisis, unspecified: Secondary | ICD-10-CM

## 2022-01-17 MED ORDER — OXYCODONE HCL 10 MG PO TABS: 10.0000 mg | ORAL_TABLET | Freq: Four times a day (QID) | ORAL | 0 refills | Status: DC | PRN

## 2022-01-17 NOTE — Telephone Encounter (Signed)
Oxycodone  °

## 2022-01-17 NOTE — Telephone Encounter (Signed)
Requesting refill on Oxycodone 10mg  IR

## 2022-01-17 NOTE — Progress Notes (Signed)
Reviewed PDMP substance reporting system prior to prescribing opiate medications. No inconsistencies noted.  Meds ordered this encounter  Medications   Oxycodone HCl 10 MG TABS    Sig: Take 1 tablet (10 mg total) by mouth every 6 (six) hours as needed for up to 15 days (for moderate to severe pain).    Dispense:  60 tablet    Refill:  0    Order Specific Question:   Supervising Provider    Answer:   Tresa Garter [id number]   [name]  APRN, MSN, FNP-C Patient [place] [address] [place], [place] [address] [phone]

## 2022-01-27 ENCOUNTER — Telehealth: Payer: Self-pay

## 2022-01-27 NOTE — Telephone Encounter (Signed)
Gabapentin Oxycodone 10 mg

## 2022-01-28 ENCOUNTER — Other Ambulatory Visit: Payer: Self-pay

## 2022-01-28 ENCOUNTER — Other Ambulatory Visit: Payer: Self-pay | Admitting: Family Medicine

## 2022-01-28 ENCOUNTER — Encounter (HOSPITAL_COMMUNITY): Payer: Self-pay

## 2022-01-28 ENCOUNTER — Inpatient Hospital Stay (HOSPITAL_BASED_OUTPATIENT_CLINIC_OR_DEPARTMENT_OTHER)
Admission: EM | Admit: 2022-01-28 | Discharge: 2022-01-30 | DRG: 812 | Disposition: A | Discharge status: Home / Self Care | Payer: Medicaid Other | Attending: Internal Medicine | Admitting: Internal Medicine

## 2022-01-28 ENCOUNTER — Encounter (HOSPITAL_BASED_OUTPATIENT_CLINIC_OR_DEPARTMENT_OTHER): Payer: Self-pay

## 2022-01-28 DIAGNOSIS — F17210 Nicotine dependence, cigarettes, uncomplicated: Secondary | ICD-10-CM | POA: Diagnosis present

## 2022-01-28 DIAGNOSIS — F419 Anxiety disorder, unspecified: Secondary | ICD-10-CM | POA: Diagnosis present

## 2022-01-28 DIAGNOSIS — D638 Anemia in other chronic diseases classified elsewhere: Secondary | ICD-10-CM | POA: Diagnosis present

## 2022-01-28 DIAGNOSIS — D571 Sickle-cell disease without crisis: Secondary | ICD-10-CM | POA: Diagnosis present

## 2022-01-28 DIAGNOSIS — G894 Chronic pain syndrome: Secondary | ICD-10-CM | POA: Diagnosis present

## 2022-01-28 DIAGNOSIS — Z888 Allergy status to other drugs, medicaments and biological substances status: Secondary | ICD-10-CM | POA: Diagnosis not present

## 2022-01-28 DIAGNOSIS — F32A Depression, unspecified: Secondary | ICD-10-CM | POA: Diagnosis present

## 2022-01-28 DIAGNOSIS — F1120 Opioid dependence, uncomplicated: Secondary | ICD-10-CM | POA: Diagnosis present

## 2022-01-28 DIAGNOSIS — F320 Major depressive disorder, single episode, mild: Secondary | ICD-10-CM | POA: Diagnosis present

## 2022-01-28 DIAGNOSIS — Z79899 Other long term (current) drug therapy: Secondary | ICD-10-CM | POA: Diagnosis not present

## 2022-01-28 DIAGNOSIS — D5700 Hb-SS disease with crisis, unspecified: Secondary | ICD-10-CM | POA: Diagnosis present

## 2022-01-28 DIAGNOSIS — F17290 Nicotine dependence, other tobacco product, uncomplicated: Secondary | ICD-10-CM | POA: Diagnosis present

## 2022-01-28 DIAGNOSIS — G8929 Other chronic pain: Secondary | ICD-10-CM | POA: Diagnosis present

## 2022-01-28 LAB — COMPREHENSIVE METABOLIC PANEL
ALT: 11 U/L (ref 0–44)
AST: 16 U/L (ref 15–41)
Albumin: 4.6 g/dL (ref 3.5–5.0)
Alkaline Phosphatase: 38 U/L (ref 38–126)
Anion gap: 7 (ref 5–15)
BUN: 11 mg/dL (ref 6–20)
CO2: 24 mmol/L (ref 22–32)
Calcium: 9.6 mg/dL (ref 8.9–10.3)
Chloride: 109 mmol/L (ref 98–111)
Creatinine, Ser: 0.87 mg/dL (ref 0.44–1.00)
GFR, Estimated: >60 mL/min (ref >60)
Glucose, Bld: 88 mg/dL (ref 70–99)
Potassium: 4.4 mmol/L (ref 3.5–5.1)
Sodium: 140 mmol/L (ref 135–145)
Total Bilirubin: 1.1 mg/dL (ref 0.3–1.2)
Total Protein: 7.6 g/dL (ref 6.5–8.1)

## 2022-01-28 LAB — CBC WITH DIFFERENTIAL/PLATELET
Abs Immature Granulocytes: 0.02 10*3/uL (ref 0.00–0.07)
Basophils Absolute: 0.1 10*3/uL (ref 0.0–0.1)
Basophils Relative: 1 %
Eosinophils Absolute: 0.2 10*3/uL (ref 0.0–0.5)
Eosinophils Relative: 2 %
HCT: 27.9 % — ABNORMAL LOW (ref 36.0–46.0)
Hemoglobin: 10.4 g/dL — ABNORMAL LOW (ref 12.0–15.0)
Immature Granulocytes: 0 %
Lymphocytes Relative: 38 %
Lymphs Abs: 3.7 10*3/uL (ref 0.7–4.0)
MCH: 30.7 pg (ref 26.0–34.0)
MCHC: 37.3 g/dL — ABNORMAL HIGH (ref 30.0–36.0)
MCV: 82.3 fL (ref 80.0–100.0)
Monocytes Absolute: 0.5 10*3/uL (ref 0.1–1.0)
Monocytes Relative: 5 %
Neutro Abs: 5.2 10*3/uL (ref 1.7–7.7)
Neutrophils Relative %: 54 %
Platelets: 444 10*3/uL — ABNORMAL HIGH (ref 150–400)
RBC: 3.39 MIL/uL — ABNORMAL LOW (ref 3.87–5.11)
RDW: 14.2 % (ref 11.5–15.5)
WBC: 9.6 10*3/uL (ref 4.0–10.5)
nRBC: 0.3 % — ABNORMAL HIGH (ref 0.0–0.2)

## 2022-01-28 LAB — RETICULOCYTES
Immature Retic Fract: 17.3 % — ABNORMAL HIGH (ref 2.3–15.9)
RBC.: 3.36 MIL/uL — ABNORMAL LOW (ref 3.87–5.11)
Retic Count, Absolute: 79.6 10*3/uL (ref 19.0–186.0)
Retic Ct Pct: 2.4 % (ref 0.4–3.1)

## 2022-01-28 LAB — HCG, SERUM, QUALITATIVE: Preg, Serum: NEGATIVE

## 2022-01-28 MED ORDER — KETOROLAC TROMETHAMINE 15 MG/ML IJ SOLN
15.0000 mg | Freq: Four times a day (QID) | INTRAMUSCULAR | Status: DC
  Administered 2022-01-28 – 2022-01-30 (×7): 15 mg via INTRAVENOUS
  Filled 2022-01-28 (×7): qty 1

## 2022-01-28 MED ORDER — HYDROMORPHONE HCL 1 MG/ML IJ SOLN
2.0000 mg | INTRAMUSCULAR | Status: AC | PRN
  Administered 2022-01-28 (×2): 2 mg via INTRAVENOUS
  Filled 2022-01-28 (×2): qty 2

## 2022-01-28 MED ORDER — HYDROMORPHONE HCL 1 MG/ML IJ SOLN
2.0000 mg | INTRAMUSCULAR | Status: AC
  Administered 2022-01-28: 2 mg via INTRAVENOUS
  Filled 2022-01-28: qty 2

## 2022-01-28 MED ORDER — OXYCODONE HCL 10 MG PO TABS: 10.0000 mg | ORAL_TABLET | Freq: Four times a day (QID) | ORAL | 0 refills | Status: DC | PRN

## 2022-01-28 MED ORDER — ONDANSETRON HCL 4 MG/2ML IJ SOLN
4.0000 mg | INTRAMUSCULAR | Status: DC | PRN
  Administered 2022-01-28: 4 mg via INTRAVENOUS
  Filled 2022-01-28: qty 2

## 2022-01-28 MED ORDER — OXYCODONE HCL 5 MG PO TABS: 10.0000 mg | ORAL_TABLET | Freq: Once | ORAL | Status: DC

## 2022-01-28 MED ORDER — HYDROMORPHONE 1 MG/ML IV SOLN
INTRAVENOUS | Status: DC
  Administered 2022-01-29: 6 mg via INTRAVENOUS
  Filled 2022-01-28: qty 30

## 2022-01-28 MED ORDER — DULOXETINE HCL 20 MG PO CPEP
20.0000 mg | ORAL_CAPSULE | Freq: Every day | ORAL | Status: DC
  Administered 2022-01-29 – 2022-01-30 (×2): 20 mg via ORAL
  Filled 2022-01-28 (×2): qty 1

## 2022-01-28 MED ORDER — HYDROMORPHONE HCL 1 MG/ML IJ SOLN: 0.5000 mg | INTRAMUSCULAR | Status: DC

## 2022-01-28 MED ORDER — HYDROMORPHONE HCL 2 MG/ML IJ SOLN
2.0000 mg | Freq: Once | INTRAMUSCULAR | Status: AC
  Administered 2022-01-28: 2 mg via INTRAVENOUS
  Filled 2022-01-28: qty 1

## 2022-01-28 MED ORDER — GABAPENTIN 400 MG PO CAPS: 400.0000 mg | ORAL_CAPSULE | Freq: Three times a day (TID) | ORAL | 1 refills | Status: DC

## 2022-01-28 MED ORDER — POLYETHYLENE GLYCOL 3350 17 G PO PACK: 17.0000 g | PACK | Freq: Every day | ORAL | Status: DC | PRN

## 2022-01-28 MED ORDER — SENNOSIDES-DOCUSATE SODIUM 8.6-50 MG PO TABS
1.0000 | ORAL_TABLET | Freq: Two times a day (BID) | ORAL | Status: DC
  Administered 2022-01-28 – 2022-01-30 (×4): 1 via ORAL
  Filled 2022-01-28 (×4): qty 1

## 2022-01-28 MED ORDER — FOLIC ACID 1 MG PO TABS
1.0000 mg | ORAL_TABLET | Freq: Every day | ORAL | Status: DC
  Administered 2022-01-29 – 2022-01-30 (×2): 1 mg via ORAL
  Filled 2022-01-28 (×2): qty 1

## 2022-01-28 MED ORDER — GABAPENTIN 400 MG PO CAPS
400.0000 mg | ORAL_CAPSULE | Freq: Three times a day (TID) | ORAL | Status: DC
  Administered 2022-01-28 – 2022-01-30 (×5): 400 mg via ORAL
  Filled 2022-01-28 (×5): qty 1

## 2022-01-28 MED ORDER — SODIUM CHLORIDE 0.9% FLUSH: 9.0000 mL | INTRAVENOUS | Status: DC | PRN

## 2022-01-28 MED ORDER — ADULT MULTIVITAMIN W/MINERALS CH
1.0000 | ORAL_TABLET | Freq: Every day | ORAL | Status: DC
  Administered 2022-01-29 – 2022-01-30 (×2): 1 via ORAL
  Filled 2022-01-28 (×2): qty 1

## 2022-01-28 MED ORDER — NALOXONE HCL 0.4 MG/ML IJ SOLN: 0.4000 mg | INTRAMUSCULAR | Status: DC | PRN

## 2022-01-28 MED ORDER — ALPRAZOLAM 0.25 MG PO TABS
0.2500 mg | ORAL_TABLET | Freq: Two times a day (BID) | ORAL | Status: DC | PRN
  Administered 2022-01-29 (×2): 0.25 mg via ORAL
  Filled 2022-01-28 (×2): qty 1

## 2022-01-28 MED ORDER — ENOXAPARIN SODIUM 40 MG/0.4ML IJ SOSY
40.0000 mg | PREFILLED_SYRINGE | INTRAMUSCULAR | Status: DC
  Filled 2022-01-28 (×2): qty 0.4

## 2022-01-28 MED ORDER — SODIUM CHLORIDE 0.45 % IV SOLN: INTRAVENOUS | Status: DC

## 2022-01-28 MED ORDER — DIPHENHYDRAMINE HCL 25 MG PO CAPS
25.0000 mg | ORAL_CAPSULE | ORAL | Status: DC | PRN
  Administered 2022-01-28 (×2): 25 mg via ORAL
  Filled 2022-01-28 (×2): qty 1

## 2022-01-28 MED ORDER — OXYCODONE HCL ER 10 MG PO T12A
10.0000 mg | EXTENDED_RELEASE_TABLET | Freq: Two times a day (BID) | ORAL | Status: DC
  Administered 2022-01-28 – 2022-01-30 (×4): 10 mg via ORAL
  Filled 2022-01-28 (×4): qty 1

## 2022-01-28 NOTE — H&P (Signed)
History and Physical   Rhiana Morash XIP:[id number] DOB: 1971/03/17 DOA: 01/28/2022  PCP: Dorena Dew, FNP   Patient coming from: Home  Chief Complaint: Sickle cell pain  HPI: Azadeh Hyder is a 51 y.o. female with medical history significant of sickle cell anemia, history of sickle cell pain crisis, depression, anxiety, chronic pain presenting with sickle cell pain.  Patient reporting pain in her leg and back which is typical of her sickle cell pain crisis.  She tried her home medications without relief.  She reached out to the sickle cell clinic and they recommend further evaluation in the ED.  She denies fevers, chills, chest pain, shortness of breath, abdominal pain, constipation, diarrhea, nausea, vomiting.  ED Course: Signs in the ED significant for blood pressure in the 673-4 19F systolic.  Lab work-up included CMP within normal limits.  CBC with hemoglobin stable at 10.4, platelets 444.  Reticulocyte count normal.  Patient received Dilaudid, Zofran, half-normal saline, Benadryl in the ED without significant improvement.  Review of Systems: As per HPI otherwise all other systems reviewed and are negative.  Past Medical History:  Diagnosis Date   Acute kidney injury ([place]) 01/18/2021   Acute pain of left shoulder    Cough 06/27/2020   Hypokalemia 06/27/2020   Hypoxia    Leukocytosis 09/03/2016   Opiate abuse, episodic ([place]) 09/25/2017   Opioid dependence in remission ([place])    Right leg pain    Sickle cell crisis ([place])    Vasoocclusive sickle cell crisis ([place]) 07/17/2021    Past Surgical History:  Procedure Laterality Date   APPENDECTOMY     CESAREAN SECTION     OTHER SURGICAL HISTORY     c-section    Social History  reports that she has been smoking cigars and cigarettes. She has never used smokeless tobacco. She reports current alcohol use. She reports that she does not use drugs.  Allergies  Allergen Reactions   Ketamine Anxiety    Tachycardia     Family History  Problem Relation Age of Onset   Stroke Neg Hx        none that she knows of    Seizures Neg Hx   Reviewed on admission  Prior to Admission medications   Medication Sig Start Date End Date Taking? Authorizing Provider  ALPRAZolam (XANAX) 0.25 MG tablet Take 1 tablet (0.25 mg total) by mouth 2 (two) times daily as needed for anxiety. 12/31/21   Elwyn Reach, MD  diphenhydrAMINE (BENADRYL ALLERGY) 25 MG tablet Take 50 mg by mouth at bedtime as needed for sleep.    [provider]  DULoxetine (CYMBALTA) 20 MG capsule Take 1 capsule (20 mg total) by mouth daily. 12/31/21 12/31/22  Elwyn Reach, MD  folic acid (FOLVITE) 1 MG tablet Take 1 tablet (1 mg total) by mouth daily. 12/31/21 12/31/22  Elwyn Reach, MD  gabapentin (NEURONTIN) 400 MG capsule Take 1 capsule (400 mg total) by mouth 3 (three) times daily. 01/28/22   Dorena Dew, FNP  ibuprofen (ADVIL) 800 MG tablet Take 1 tablet (800 mg total) by mouth every 8 (eight) hours as needed for moderate pain. 12/31/21 01/30/22  Elwyn Reach, MD  oxyCODONE (OXYCONTIN) 10 mg 12 hr tablet Take 1 tablet (10 mg total) by mouth every 12 (twelve) hours. 12/31/21 01/30/22  Elwyn Reach, MD  Oxycodone HCl 10 MG TABS Take 1 tablet (10 mg total) by mouth every 6 (six) hours as needed for up to  15 days (for moderate to severe pain). 01/31/22 02/15/22  Dorena Dew, FNP    Physical Exam: Vitals:   01/28/22 2040 01/28/22 2045 01/28/22 2050 01/28/22 2100  BP:    (!) 113/56  Pulse: 96 71 84 76  Resp: (!) 25 (!) 25 (!) 25 20  Temp:    98 F (36.7 C)  TempSrc:    Oral  SpO2: 97% 98% 97% 98%  Weight:      Height:        Physical Exam Constitutional:      General: She is not in acute distress.    Appearance: She is ill-appearing.  HENT:     Head: Normocephalic and atraumatic.     Mouth/Throat:     Mouth: Mucous membranes are moist.     Pharynx: Oropharynx is clear.  Eyes:     Extraocular Movements:  Extraocular movements intact.     Pupils: Pupils are equal, round, and reactive to light.  Cardiovascular:     Rate and Rhythm: Normal rate and regular rhythm.     Pulses: Normal pulses.     Heart sounds: Normal heart sounds.  Pulmonary:     Effort: Pulmonary effort is normal. No respiratory distress.     Breath sounds: Normal breath sounds.  Abdominal:     General: Bowel sounds are normal. There is no distension.     Palpations: Abdomen is soft.     Tenderness: There is no abdominal tenderness.  Musculoskeletal:        General: No swelling or deformity.  Skin:    General: Skin is warm and dry.  Neurological:     General: No focal deficit present.     Mental Status: Mental status is at baseline.    Labs on Admission: I have personally reviewed following labs and imaging studies  CBC: Recent Labs  Lab 01/28/22 1448  WBC 9.6  NEUTROABS 5.2  HGB 10.4*  HCT 27.9*  MCV 82.3  PLT 444*    Basic Metabolic Panel: Recent Labs  Lab 01/28/22 1448  NA 140  K 4.4  CL 109  CO2 24  GLUCOSE 88  BUN 11  CREATININE 0.87  CALCIUM 9.6    GFR: Estimated Creatinine Clearance: 64.4 mL/min (by C-G formula based on SCr of 0.87 mg/dL).  Liver Function Tests: Recent Labs  Lab 01/28/22 1448  AST 16  ALT 11  ALKPHOS 38  BILITOT 1.1  PROT 7.6  ALBUMIN 4.6    Urine analysis:    Component Value Date/Time   COLORURINE YELLOW 05/31/2021 1350   APPEARANCEUR TURBID (A) 05/31/2021 1350   LABSPEC 1.012 05/31/2021 1350   PHURINE 5.0 05/31/2021 1350   GLUCOSEU NEGATIVE 05/31/2021 1350   HGBUR SMALL (A) 05/31/2021 1350   BILIRUBINUR NEGATIVE 05/31/2021 1350   BILIRUBINUR neg 04/07/2020 1014   KETONESUR NEGATIVE 05/31/2021 1350   PROTEINUR 100 (A) 05/31/2021 1350   UROBILINOGEN 0.2 04/07/2020 1014   UROBILINOGEN 1.0 12/25/2012 1755   NITRITE NEGATIVE 05/31/2021 1350   LEUKOCYTESUR MODERATE (A) 05/31/2021 1350    Radiological Exams on Admission: No results found.  EKG: Not  performed in the ED  Assessment/Plan Principal Problem:   Acute sickle cell crisis (HCC) Active Problems:   Chronic pain   Sickle cell anemia (HCC)   Anxiety and depression   Sickle cell pain crisis > Patient presented with back and leg pain typical of her sickle cell pain crisis.  Not improved with medication at home called sickle cell clinic  recommended ED evaluation and pain not improved with medication in the ED so patient being admitted for further pain management. - Monitor on telemetry - Give additional Dilaudid dose while using PCA pump - Then initiate PCA pump with Dilaudid as needed - Scheduled home oxycodone - Scheduled Toradol - Continue Zofran, folate, multivitamin  Depression Anxiety - Continue home Xanax, duloxetine  DVT prophylaxis: Lovenox Code Status:   Full Family Communication:  Updated at bedside  Disposition Plan:   Patient is from:  Home  Anticipated DC to:  Home  Anticipated DC date:  1 - 3 days  Anticipated DC barriers: None  Consults called:  None Admission status:  Has been accepted as inpatient on telemetry  Severity of Illness: The appropriate patient status for this patient is INPATIENT. Inpatient status is judged to be reasonable and necessary in order to provide the required intensity of service to ensure the patient's safety. The patient's presenting symptoms, physical exam findings, and initial radiographic and laboratory data in the context of their chronic comorbidities is felt to place them at high risk for further clinical deterioration. Furthermore, it is not anticipated that the patient will be medically stable for discharge from the hospital within 2 midnights of admission.   * I certify that at the point of admission it is my clinical judgment that the patient will require inpatient hospital care spanning beyond 2 midnights from the point of admission due to high intensity of service, high risk for further deterioration and high frequency  of surveillance required.Marcelyn Bruins MD [facility]  How to contact the [facility] Attending or Consulting provider [place] or covering provider during after hours [place], for this patient?   Check the care team in [facility] and look for a) attending/consulting [facility] provider listed and b) the [facility] team listed Log into [email or web address] and use [place]'s universal password to access. If you do not have the password, please contact the hospital operator. Locate the [facility] provider you are looking for under [facility] and page to a number that you can be directly reached. If you still have difficulty reaching the provider, please page the [facility] (Director on Call) for the Hospitalists listed on amion for assistance.  01/28/2022, 11:02 PM

## 2022-01-28 NOTE — Progress Notes (Signed)
Reviewed PDMP substance reporting system prior to prescribing opiate medications. No inconsistencies noted.  Meds ordered this encounter  Medications   Oxycodone HCl 10 MG TABS    Sig: Take 1 tablet (10 mg total) by mouth every 6 (six) hours as needed for up to 15 days (for moderate to severe pain).    Dispense:  60 tablet    Refill:  0    Order Specific Question:   Supervising Provider    Answer:   Tresa Garter [[id number]]   gabapentin (NEURONTIN) 400 MG capsule    Sig: Take 1 capsule (400 mg total) by mouth 3 (three) times daily.    Dispense:  90 capsule    Refill:  1    Order Specific Question:   Supervising Provider    Answer:   Tresa Garter [[id number]]   [name]  APRN, MSN, FNP-C Patient [place] [address] [place], [place] [address] [phone]

## 2022-01-28 NOTE — ED Provider Notes (Signed)
Clinical Course as of 01/28/22 1556  Fri Jan 28, 2022  1537 Comprehensive metabolic panel Normal [JK]  1537 CBC with Differential(!) Anemia is stable [JK]  1537 hCG, serum, qualitative Negative [JK]  1543  Stable 51 YOF with ccx of scpc. History of admissions for it. Does not want admission. Getting round 2 at this time. On the protocol. Reassess.  [CC]    Clinical Course User Index [CC] Tretha Sciara, MD [JK] Dorie Rank, MD   Reevaluated patient at bedside.  She is tearful crying in acute pain.  Protocol continued to be ordered, hospitalist communicated with and accepted patient in admission.  Patient admitted with no further acute events.   Tretha Sciara, MD 01/28/22 [phone]

## 2022-01-28 NOTE — ED Provider Notes (Signed)
[facility] EMERGENCY DEPT Provider Note   CSN: [id number] Arrival date & time: 01/28/22  1336     History  Chief Complaint  Patient presents with   Sickle Cell Pain Crisis    Oneita Hurt Lassie [name] is a 51 y.o. female.   Sickle Cell Pain Crisis    Patient has a history of sickle cell crisis, acute kidney injury, chronic pain who presents to the ED with acute sickle cell pain crisis.  Patient states she is having a typical sickle cell pain crisis occurring in her back and in her leg.  Patient has tried her home medications that include oxycodone without relief.  Patient states she called the sickle cells clinic and was instructed to come to the ED.  She denies any fevers.  No chest pain.  Home Medications Prior to Admission medications   Medication Sig Start Date End Date Taking? Authorizing Provider  ALPRAZolam (XANAX) 0.25 MG tablet Take 1 tablet (0.25 mg total) by mouth 2 (two) times daily as needed for anxiety. 12/31/21   Rometta Emery, MD  diphenhydrAMINE (BENADRYL ALLERGY) 25 MG tablet Take 50 mg by mouth at bedtime as needed for sleep.    [provider]  DULoxetine (CYMBALTA) 20 MG capsule Take 1 capsule (20 mg total) by mouth daily. 12/31/21 12/31/22  Rometta Emery, MD  folic acid (FOLVITE) 1 MG tablet Take 1 tablet (1 mg total) by mouth daily. 12/31/21 12/31/22  Rometta Emery, MD  gabapentin (NEURONTIN) 400 MG capsule Take 1 capsule (400 mg total) by mouth 3 (three) times daily. 01/28/22   Massie Maroon, FNP  ibuprofen (ADVIL) 800 MG tablet Take 1 tablet (800 mg total) by mouth every 8 (eight) hours as needed for moderate pain. 12/31/21 01/30/22  Rometta Emery, MD  oxyCODONE (OXYCONTIN) 10 mg 12 hr tablet Take 1 tablet (10 mg total) by mouth every 12 (twelve) hours. 12/31/21 01/30/22  Rometta Emery, MD  Oxycodone HCl 10 MG TABS Take 1 tablet (10 mg total) by mouth every 6 (six) hours as needed for up to 15 days (for moderate to severe  pain). 01/31/22 02/15/22  Massie Maroon, FNP      Allergies    Ketamine    Review of Systems   Review of Systems  Physical Exam Updated Vital Signs BP 114/87   Pulse 70   Temp 97.9 F (36.6 C)   Resp 17   Ht 1.524 m (5')   Wt 63.5 kg   LMP 07/09/2021 (Approximate)   SpO2 98%   BMI 27.34 kg/m  Physical Exam Vitals and nursing note reviewed.  Constitutional:      Appearance: She is well-developed. She is ill-appearing.     Comments: Tearful  HENT:     Head: Normocephalic and atraumatic.     Right Ear: External ear normal.     Left Ear: External ear normal.  Eyes:     General: No scleral icterus.       Right eye: No discharge.        Left eye: No discharge.     Conjunctiva/sclera: Conjunctivae normal.  Neck:     Trachea: No tracheal deviation.  Cardiovascular:     Rate and Rhythm: Normal rate and regular rhythm.  Pulmonary:     Effort: Pulmonary effort is normal. No respiratory distress.     Breath sounds: Normal breath sounds. No stridor. No wheezing or rales.  Abdominal:     General: Bowel sounds are normal.  There is no distension.     Palpations: Abdomen is soft.     Tenderness: There is no abdominal tenderness. There is no guarding or rebound.  Musculoskeletal:        General: No swelling or deformity.     Cervical back: Neck supple.     Comments: Tenderness to palpation extremities no erythema or effusions noted  Skin:    General: Skin is warm and dry.     Findings: No rash.  Neurological:     General: No focal deficit present.     Mental Status: She is alert.     Cranial Nerves: No cranial nerve deficit (no facial droop, extraocular movements intact, no slurred speech).     Sensory: No sensory deficit.     Motor: No abnormal muscle tone or seizure activity.     Coordination: Coordination normal.  Psychiatric:        Mood and Affect: Mood normal.     ED Results / Procedures / Treatments   Labs (all labs ordered are listed, but only abnormal  results are displayed) Labs Reviewed  CBC WITH DIFFERENTIAL/PLATELET - Abnormal; Notable for the following components:      Result Value   RBC 3.39 (*)    Hemoglobin 10.4 (*)    HCT 27.9 (*)    MCHC 37.3 (*)    Platelets 444 (*)    nRBC 0.3 (*)    All other components within normal limits  RETICULOCYTES - Abnormal; Notable for the following components:   RBC. 3.36 (*)    Immature Retic Fract 17.3 (*)    All other components within normal limits  COMPREHENSIVE METABOLIC PANEL  HCG, SERUM, QUALITATIVE    EKG None  Radiology No results found.  Procedures Procedures    Medications Ordered in ED Medications  0.45 % sodium chloride infusion ( Intravenous New Bag/Given 01/28/22 1452)  diphenhydrAMINE (BENADRYL) capsule 25-50 mg (25 mg Oral Given 01/28/22 1452)  ondansetron (ZOFRAN) injection 4 mg (4 mg Intravenous Given 01/28/22 1451)  HYDROmorphone (DILAUDID) injection 2 mg (has no administration in time range)  HYDROmorphone (DILAUDID) injection 2 mg (2 mg Intravenous Given 01/28/22 1450)  HYDROmorphone (DILAUDID) injection 2 mg (2 mg Intravenous Given 01/28/22 1531)    ED Course/ Medical Decision Making/ A&P Clinical Course as of 01/28/22 1538  Fri Jan 28, 2022  1537 Comprehensive metabolic panel Normal [JK]  1537 CBC with Differential(!) Anemia is stable [JK]  1537 hCG, serum, qualitative Negative [JK]    Clinical Course User Index [JK] Linwood Dibbles, MD                           Medical Decision Making Presentation concerning for sickle cell pain crisis, concerns for complications including worsening anemia, dehydration, infection  Problems Addressed: Sickle cell pain crisis [facility]): acute illness or injury that poses a threat to life or bodily functions  Amount and/or Complexity of Data Reviewed External Data Reviewed: notes.    Details: Prior hospital admissions reviewed Labs: ordered. Decision-making details documented in ED Course.  Risk Prescription drug  management. Parenteral controlled substances.   Initial ED evaluation without signs of infection.  Patient's laboratory tests are stable.  She is having significant pain requiring IV narcotic pain management.  Patient has received 2 doses of pain medication so far.  She would still like to try and see if her symptoms will improve so she can manage as an outpatient.  She has required  admission to the hospital in the past.  Care turned over to Dr. Oswald Hillock at shift change        Final Clinical Impression(s) / ED Diagnoses Final diagnoses:  Sickle cell pain crisis [facility])    Rx / DC Orders ED Discharge Orders     None         Dorie Rank, MD 01/28/22 1539

## 2022-01-28 NOTE — ED Notes (Signed)
Pain 10/10 before [facility] left. Pt given PRN 2mg  Dilaudid. BP 140/70 HR 90 RR 18. CAOx4. Pain same as reason for arrival at the hospital.

## 2022-01-28 NOTE — ED Triage Notes (Signed)
Pt c/o SCC in her back and right leg. No relief with home meds.

## 2022-01-29 LAB — MRSA NEXT GEN BY PCR, NASAL: MRSA by PCR Next Gen: DETECTED — AB

## 2022-01-29 MED ORDER — MUPIROCIN 2 % EX OINT
1.0000 | TOPICAL_OINTMENT | Freq: Two times a day (BID) | CUTANEOUS | Status: DC
  Administered 2022-01-29: 1 via NASAL
  Filled 2022-01-29: qty 22

## 2022-01-29 MED ORDER — OXYCODONE HCL 5 MG PO TABS
10.0000 mg | ORAL_TABLET | Freq: Four times a day (QID) | ORAL | Status: DC | PRN
  Administered 2022-01-29 – 2022-01-30 (×2): 10 mg via ORAL
  Filled 2022-01-29 (×2): qty 2

## 2022-01-29 MED ORDER — SALINE SPRAY 0.65 % NA SOLN
1.0000 | NASAL | Status: DC | PRN
  Filled 2022-01-29: qty 44

## 2022-01-29 MED ORDER — HYDROMORPHONE 1 MG/ML IV SOLN
INTRAVENOUS | Status: DC
  Administered 2022-01-29: 4 mg via INTRAVENOUS
  Administered 2022-01-29: 0.5 mg via INTRAVENOUS
  Administered 2022-01-29: 6.4 mg via INTRAVENOUS
  Administered 2022-01-29: 5.19 mg via INTRAVENOUS
  Administered 2022-01-30: 7 mg via INTRAVENOUS
  Administered 2022-01-30: 5 mg via INTRAVENOUS
  Administered 2022-01-30: 3 mg via INTRAVENOUS
  Filled 2022-01-29: qty 30

## 2022-01-29 MED ORDER — CHLORHEXIDINE GLUCONATE CLOTH 2 % EX PADS
6.0000 | MEDICATED_PAD | Freq: Every day | CUTANEOUS | Status: DC
  Administered 2022-01-30: 6 via TOPICAL

## 2022-01-29 NOTE — Progress Notes (Signed)
Subjective: Lindsey [name] is a 51 year old female with a medical history significant for sickle cell disease, anemia of chronic disease, opiate dependence and tolerance that was admitted for sickle cell pain crisis. Pain intensity is 8/10 primarily to low back and lower extremities.  Patient denies any headache, chest pain, urinary symptoms, nausea, vomiting, or diarrhea.  Objective:  Vital signs in last 24 hours:  Vitals:   01/30/22 0719 01/30/22 1023 01/30/22 1155 01/30/22 1356  BP:  105/65  117/75  Pulse:  65  66  Resp: 14 18 20 16   Temp:  98.3 F (36.8 C)  98.8 F (37.1 C)  TempSrc:  Oral  Oral  SpO2: 97% 95% 99% 100%  Weight:      Height:        Intake/Output from previous day:  No intake or output data in the 24 hours ending 02/05/22 2224  Physical Exam: General: Alert, awake, oriented x3, in no acute distress.  HEENT: [place]/AT PEERL, EOMI Neck: Trachea midline,  no masses, no thyromegal,y no JVD, no carotid bruit OROPHARYNX:  Moist, No exudate/ erythema/lesions.  Heart: Regular rate and rhythm, without murmurs, rubs, gallops, PMI non-displaced, no heaves or thrills on palpation.  Lungs: Clear to auscultation, no wheezing or rhonchi noted. No increased vocal fremitus resonant to percussion  Abdomen: Soft, nontender, nondistended, positive bowel sounds, no masses no hepatosplenomegaly noted..  Neuro: No focal neurological deficits noted cranial nerves II through XII grossly intact. DTRs 2+ bilaterally upper and lower extremities. Strength 5 out of 5 in bilateral upper and lower extremities. Musculoskeletal: No warm swelling or erythema around joints, no spinal tenderness noted. Psychiatric: Patient alert and oriented x3, good insight and cognition, good recent to remote recall. Lymph node survey: No cervical axillary or inguinal lymphadenopathy noted.  Lab Results:  Basic Metabolic Panel:    Component Value Date/Time   NA 140 01/28/2022 1448   NA 142 05/20/2021 1101    K 4.4 01/28/2022 1448   CL 109 01/28/2022 1448   CO2 24 01/28/2022 1448   BUN 11 01/28/2022 1448   BUN 10 05/20/2021 1101   CREATININE 0.87 01/28/2022 1448   GLUCOSE 88 01/28/2022 1448   CALCIUM 9.6 01/28/2022 1448   CBC:    Component Value Date/Time   WBC 9.6 01/28/2022 1448   HGB 10.4 (L) 01/28/2022 1448   HGB 11.0 (L) 05/20/2021 1101   HCT 27.9 (L) 01/28/2022 1448   HCT 31.3 (L) 05/20/2021 1101   PLT 444 (H) 01/28/2022 1448   PLT 509 (H) 05/20/2021 1101   MCV 82.3 01/28/2022 1448   MCV 88 05/20/2021 1101   NEUTROABS 5.2 01/28/2022 1448   NEUTROABS 5.4 05/20/2021 1101   LYMPHSABS 3.7 01/28/2022 1448   LYMPHSABS 2.5 05/20/2021 1101   MONOABS 0.5 01/28/2022 1448   EOSABS 0.2 01/28/2022 1448   EOSABS 0.3 05/20/2021 1101   BASOSABS 0.1 01/28/2022 1448   BASOSABS 0.1 05/20/2021 1101    Recent Results (from the past 240 hour(s))  MRSA Next Gen by PCR, Nasal     Status: Abnormal   Collection Time: 01/29/22 12:13 AM   Specimen: Nasal Mucosa; Nasal Swab  Result Value Ref Range Status   MRSA by PCR Next Gen DETECTED (A) NOT DETECTED Final    Comment: RESULT CALLED TO, READ BACK BY AND VERIFIED WITH: [place], R @ [id number] [id number] JMK (NOTE) The GeneXpert MRSA Assay (FDA approved for NASAL specimens only), is one component of a comprehensive MRSA colonization surveillance program. It is not intended  to diagnose MRSA infection nor to guide or monitor treatment for MRSA infections. Test performance is not FDA approved in patients less than 56 years old. Performed at [facility], [place] [address], [place], [place] [address]     Studies/Results: No results found.  Medications: Scheduled Meds: Continuous Infusions: PRN Meds:.  Consultants: none  Procedures: none  Antibiotics: none  Assessment/Plan: Principal Problem:   Acute sickle cell crisis (HCC) Active Problems:   Chronic pain   Sickle cell anemia (HCC)   Anxiety and depression  Sickle  cell disease with pain crisis: Decrease IV fluids to KVO Toradol 15 mg IV x1 Continue IV Dilaudid PCA Monitor vital signs very closely, reevaluate pain scale regularly, and supplemental oxygen as needed  Chronic pain syndrome: Continue home medications  Anemia of chronic disease: Stable.  Consistent with patient's baseline.  No clinical indication for blood transfusion at this time.  Monitor closely. Code Status: Full Code Family Communication: N/A Disposition Plan: Not yet ready for discharge [place], MSN, FNP-C Patient [place] [address] [place], [place] [address] [phone]  If 7PM-7AM, please contact night-coverage.  02/05/2022, 10:24 PM  LOS: 2 days

## 2022-01-30 NOTE — Progress Notes (Signed)
Patient discharged to home with family, discharge instructions reviewed with patient who verbalized understanding. 

## 2022-01-30 NOTE — Discharge Summary (Addendum)
Physician Discharge Summary  Lindsey [name] KVQ:[id number] DOB: [birth year] DOA: 01/28/2022  PCP: Dorena Dew, FNP  Admit date: 01/28/2022  Discharge date: 01/30/2022  Discharge Diagnoses:  Principal Problem:   Acute sickle cell crisis ([place]) Active Problems:   Chronic pain   Sickle cell anemia (HCC)   Anxiety and depression   Discharge Condition: Stable  Disposition:   Follow-up Information     Dorena Dew, FNP Follow up.   Specialty: Family Medicine Contact information: [address] [place] [address] [phone]                Pt is discharged home in good condition and is to follow up with Dorena Dew, FNP this week to have labs evaluated. Lindsey [name] is instructed to increase activity slowly and balance with rest for the next few days, and use prescribed medication to complete treatment of pain  Diet: Regular Wt Readings from Last 3 Encounters:  01/28/22 63.5 kg  12/31/21 63.5 kg  12/28/21 63.5 kg    History of present illness:   Lindsey [name] is a 51 year old female with a medical history significant for sickle cell anemia, history of sickle cell pain crisis, depression, anxiety, chronic pain syndrome presenting with sickle cell pain. Patient reporting pain in her legs and back which is typical of her sickle cell pain crisis.  She tried her home medications without relief.  She reached out the sickle cell clinic and they were recommended further evaluation in the emergency department. She denies fevers, chills, chest pain, shortness of breath, abdominal pain, constipation, diarrhea, or nausea.  No sick contacts, recent travel known exposure to COVID-19. Hospital Course:  Sickle cell disease with pain crisis: Patient was admitted for sickle cell pain crisis and managed appropriately with IVF, IV Dilaudid via PCA and IV Toradol, as well as other adjunct therapies per sickle cell pain management protocols.  IV  Dilaudid PCA weaned appropriately and patient transition to home medications.  Patient advised to resume home medications and follow-up with PCP in 2 weeks to repeat CBC with differential, CMP, and reticulocytes. Patient is alert, oriented, and ambulating without  Patient was therefore discharged home today in a hemodynamically stable condition.   Lindsey [name] will follow-up with PCP within 1 week of this discharge. Lindsey [name] was counseled extensively about nonpharmacologic means of pain management, patient verbalized understanding and was appreciative of  the care received during this admission.   We discussed the need for good hydration, monitoring of hydration status, avoidance of heat, cold, stress, and infection triggers. We discussed the need to be adherent with taking Hydrea and other home medications. Patient was reminded of the need to seek medical attention immediately if any symptom of bleeding, anemia, or infection occurs.  Discharge Exam: Vitals:   01/30/22 1155 01/30/22 1356  BP:  117/75  Pulse:  66  Resp: 20 16  Temp:  98.8 F (37.1 C)  SpO2: 99% 100%   Vitals:   01/30/22 0719 01/30/22 1023 01/30/22 1155 01/30/22 1356  BP:  105/65  117/75  Pulse:  65  66  Resp: 14 18 20 16   Temp:  98.3 F (36.8 C)  98.8 F (37.1 C)  TempSrc:  Oral  Oral  SpO2: 97% 95% 99% 100%  Weight:      Height:        General appearance : Awake, alert, not in any distress. Speech Clear. Not toxic looking HEENT: Atraumatic and Normocephalic, pupils equally reactive to  light and accomodation Neck: Supple, no JVD. No cervical lymphadenopathy.  Chest: Good air entry bilaterally, no added sounds  CVS: S1 S2 regular, no murmurs.  Abdomen: Bowel sounds present, Non tender and not distended with no gaurding, rigidity or rebound. Extremities: B/L Lower Ext shows no edema, both legs are warm to touch Neurology: Awake alert, and oriented X 3, CN II-XII intact, Non focal Skin: No Rash  Discharge  Instructions  Discharge Instructions     Discharge patient   Complete by: As directed    Discharge disposition: 01-Home or Self Care   Discharge patient date: 01/30/2022      Allergies as of 01/30/2022       Reactions   Ketamine Anxiety, Other (See Comments)   Tachycardia        Medication List     TAKE these medications    ALPRAZolam 0.25 MG tablet Commonly known as: XANAX Take 1 tablet (0.25 mg total) by mouth 2 (two) times daily as needed for anxiety.   Benadryl Allergy 25 MG tablet Generic drug: diphenhydrAMINE Take 50 mg by mouth at bedtime as needed for sleep.   DULoxetine 20 MG capsule Commonly known as: Cymbalta Take 1 capsule (20 mg total) by mouth daily.   folic acid 1 MG tablet Commonly known as: FOLVITE Take 1 tablet (1 mg total) by mouth daily.   gabapentin 400 MG capsule Commonly known as: NEURONTIN Take 1 capsule (400 mg total) by mouth 3 (three) times daily.   Oxycodone HCl 10 MG Tabs Take 1 tablet (10 mg total) by mouth every 6 (six) hours as needed for up to 15 days (for moderate to severe pain).       ASK your doctor about these medications    ibuprofen 800 MG tablet Commonly known as: ADVIL Take 1 tablet (800 mg total) by mouth every 8 (eight) hours as needed for moderate pain. Ask about: Should I take this medication?   oxyCODONE 10 mg 12 hr tablet Commonly known as: OXYCONTIN Take 1 tablet (10 mg total) by mouth every 12 (twelve) hours. Ask about: Should I take this medication?        The results of significant diagnostics from this hospitalization (including imaging, microbiology, ancillary and laboratory) are listed below for reference.    Significant Diagnostic Studies: No results found.  Microbiology: Recent Results (from the past 240 hour(s))  MRSA Next Gen by PCR, Nasal     Status: Abnormal   Collection Time: 01/29/22 12:13 AM   Specimen: Nasal Mucosa; Nasal Swab  Result Value Ref Range Status   MRSA by PCR Next  Gen DETECTED (A) NOT DETECTED Final    Comment: RESULT CALLED TO, READ BACK BY AND VERIFIED WITH: [place], R @ [phone] JMK (NOTE) The GeneXpert MRSA Assay (FDA approved for NASAL specimens only), is one component of a comprehensive MRSA colonization surveillance program. It is not intended to diagnose MRSA infection nor to guide or monitor treatment for MRSA infections. Test performance is not FDA approved in patients less than 56 years old. Performed at [facility], [address], [place], [place] [address]      Labs: Basic Metabolic Panel: No results for input(s): "NA", "K", "CL", "CO2", "GLUCOSE", "BUN", "CREATININE", "CALCIUM", "MG", "PHOS" in the last 168 hours. Liver Function Tests: No results for input(s): "AST", "ALT", "ALKPHOS", "BILITOT", "PROT", "ALBUMIN" in the last 168 hours. No results for input(s): "LIPASE", "AMYLASE" in the last 168 hours. No results for input(s): "AMMONIA" in  the last 168 hours. CBC: No results for input(s): "WBC", "NEUTROABS", "HGB", "HCT", "MCV", "PLT" in the last 168 hours. Cardiac Enzymes: No results for input(s): "CKTOTAL", "CKMB", "CKMBINDEX", "TROPONINI" in the last 168 hours. BNP: Invalid input(s): "POCBNP" CBG: No results for input(s): "GLUCAP" in the last 168 hours.  Time coordinating discharge: 50 minutes  Signed:  [name]  APRN, MSN, FNP-C Patient Care [facility] [address] [place], [place] [address] [phone]  [facility] 02/05/2022, 10:26 PM   Evaluation and management procedures were performed by the Advanced Practitioner under my supervision and collaboration. I have reviewed the Advanced Practitioner's note and chart, and I agree with the management and plan.   Jeanann Lewandowsky, MD, MHA, CPE, [facility] [place], [place] [phone] 02/06/2022, 11:40 AM

## 2022-02-04 ENCOUNTER — Telehealth: Payer: Self-pay

## 2022-02-04 ENCOUNTER — Other Ambulatory Visit: Payer: Self-pay | Admitting: Family Medicine

## 2022-02-04 DIAGNOSIS — G894 Chronic pain syndrome: Secondary | ICD-10-CM

## 2022-02-04 MED ORDER — OXYCODONE HCL ER 10 MG PO T12A: 10.0000 mg | EXTENDED_RELEASE_TABLET | Freq: Two times a day (BID) | ORAL | 0 refills | Status: DC

## 2022-02-04 NOTE — Telephone Encounter (Signed)
Oxycodone 10mg

## 2022-02-04 NOTE — Progress Notes (Signed)
Reviewed PDMP substance reporting system prior to prescribing opiate medications. No inconsistencies noted.  Meds ordered this encounter  Medications   oxyCODONE (OXYCONTIN) 10 mg 12 hr tablet    Sig: Take 1 tablet (10 mg total) by mouth every 12 (twelve) hours.    Dispense:  14 tablet    Refill:  0    Order Specific Question:   Supervising Provider    Answer:   [name], [name]      Azhia Siefken Moore Ellora Varnum  APRN, MSN, FNP-C [facility] [address]  [place], [place] [address] [phone]  

## 2022-02-11 ENCOUNTER — Other Ambulatory Visit: Payer: Self-pay

## 2022-02-11 ENCOUNTER — Encounter (HOSPITAL_COMMUNITY): Payer: Self-pay

## 2022-02-11 ENCOUNTER — Inpatient Hospital Stay (HOSPITAL_COMMUNITY)
Admission: EM | Admit: 2022-02-11 | Discharge: 2022-02-14 | DRG: 812 | Disposition: A | Discharge status: Home / Self Care | Payer: Medicaid Other | Attending: Internal Medicine | Admitting: Internal Medicine

## 2022-02-11 DIAGNOSIS — G894 Chronic pain syndrome: Secondary | ICD-10-CM | POA: Diagnosis present

## 2022-02-11 DIAGNOSIS — G8929 Other chronic pain: Secondary | ICD-10-CM | POA: Diagnosis present

## 2022-02-11 DIAGNOSIS — Z888 Allergy status to other drugs, medicaments and biological substances status: Secondary | ICD-10-CM | POA: Diagnosis not present

## 2022-02-11 DIAGNOSIS — F32A Depression, unspecified: Secondary | ICD-10-CM | POA: Diagnosis present

## 2022-02-11 DIAGNOSIS — F419 Anxiety disorder, unspecified: Secondary | ICD-10-CM | POA: Diagnosis present

## 2022-02-11 DIAGNOSIS — D638 Anemia in other chronic diseases classified elsewhere: Secondary | ICD-10-CM

## 2022-02-11 DIAGNOSIS — Z79899 Other long term (current) drug therapy: Secondary | ICD-10-CM

## 2022-02-11 DIAGNOSIS — R0689 Other abnormalities of breathing: Secondary | ICD-10-CM | POA: Diagnosis not present

## 2022-02-11 DIAGNOSIS — F1120 Opioid dependence, uncomplicated: Secondary | ICD-10-CM | POA: Diagnosis present

## 2022-02-11 DIAGNOSIS — D5700 Hb-SS disease with crisis, unspecified: Principal | ICD-10-CM | POA: Diagnosis present

## 2022-02-11 LAB — CBC WITH DIFFERENTIAL/PLATELET
Abs Immature Granulocytes: 0.01 10*3/uL (ref 0.00–0.07)
Basophils Absolute: 0.1 10*3/uL (ref 0.0–0.1)
Basophils Relative: 1 %
Eosinophils Absolute: 0.2 10*3/uL (ref 0.0–0.5)
Eosinophils Relative: 3 %
HCT: 30.4 % — ABNORMAL LOW (ref 36.0–46.0)
Hemoglobin: 11.1 g/dL — ABNORMAL LOW (ref 12.0–15.0)
Immature Granulocytes: 0 %
Lymphocytes Relative: 45 %
Lymphs Abs: 4.5 10*3/uL — ABNORMAL HIGH (ref 0.7–4.0)
MCH: 30.7 pg (ref 26.0–34.0)
MCHC: 36.5 g/dL — ABNORMAL HIGH (ref 30.0–36.0)
MCV: 84.2 fL (ref 80.0–100.0)
Monocytes Absolute: 0.6 10*3/uL (ref 0.1–1.0)
Monocytes Relative: 6 %
Neutro Abs: 4.4 10*3/uL (ref 1.7–7.7)
Neutrophils Relative %: 45 %
Platelets: 478 10*3/uL — ABNORMAL HIGH (ref 150–400)
RBC: 3.61 MIL/uL — ABNORMAL LOW (ref 3.87–5.11)
RDW: 14.9 % (ref 11.5–15.5)
WBC: 9.8 10*3/uL (ref 4.0–10.5)
nRBC: 0.3 % — ABNORMAL HIGH (ref 0.0–0.2)

## 2022-02-11 LAB — COMPREHENSIVE METABOLIC PANEL
ALT: 18 U/L (ref 0–44)
AST: 28 U/L (ref 15–41)
Albumin: 4.4 g/dL (ref 3.5–5.0)
Alkaline Phosphatase: 43 U/L (ref 38–126)
Anion gap: 8 (ref 5–15)
BUN: 10 mg/dL (ref 6–20)
CO2: 21 mmol/L — ABNORMAL LOW (ref 22–32)
Calcium: 9.2 mg/dL (ref 8.9–10.3)
Chloride: 112 mmol/L — ABNORMAL HIGH (ref 98–111)
Creatinine, Ser: 0.81 mg/dL (ref 0.44–1.00)
GFR, Estimated: >60 mL/min (ref >60)
Glucose, Bld: 106 mg/dL — ABNORMAL HIGH (ref 70–99)
Potassium: 4.3 mmol/L (ref 3.5–5.1)
Sodium: 141 mmol/L (ref 135–145)
Total Bilirubin: 1.4 mg/dL — ABNORMAL HIGH (ref 0.3–1.2)
Total Protein: 7.7 g/dL (ref 6.5–8.1)

## 2022-02-11 LAB — RETICULOCYTES
Immature Retic Fract: 23.7 % — ABNORMAL HIGH (ref 2.3–15.9)
RBC.: 3.61 MIL/uL — ABNORMAL LOW (ref 3.87–5.11)
Retic Count, Absolute: 153.8 10*3/uL (ref 19.0–186.0)
Retic Ct Pct: 4.3 % — ABNORMAL HIGH (ref 0.4–3.1)

## 2022-02-11 MED ORDER — NALOXONE HCL 0.4 MG/ML IJ SOLN: 0.4000 mg | INTRAMUSCULAR | Status: DC | PRN

## 2022-02-11 MED ORDER — SENNOSIDES-DOCUSATE SODIUM 8.6-50 MG PO TABS
1.0000 | ORAL_TABLET | Freq: Two times a day (BID) | ORAL | Status: DC
  Administered 2022-02-11 – 2022-02-13 (×3): 1 via ORAL
  Filled 2022-02-11 (×6): qty 1

## 2022-02-11 MED ORDER — FOLIC ACID 1 MG PO TABS
1.0000 mg | ORAL_TABLET | Freq: Every day | ORAL | Status: DC
  Administered 2022-02-11 – 2022-02-14 (×4): 1 mg via ORAL
  Filled 2022-02-11 (×4): qty 1

## 2022-02-11 MED ORDER — POLYETHYLENE GLYCOL 3350 17 G PO PACK: 17.0000 g | PACK | Freq: Every day | ORAL | Status: DC | PRN

## 2022-02-11 MED ORDER — HYDROMORPHONE HCL 2 MG/ML IJ SOLN
2.0000 mg | Freq: Once | INTRAMUSCULAR | Status: AC
  Administered 2022-02-11: 2 mg via INTRAVENOUS
  Filled 2022-02-11: qty 1

## 2022-02-11 MED ORDER — SODIUM CHLORIDE 0.9 % IV BOLUS
1000.0000 mL | Freq: Once | INTRAVENOUS | Status: AC
  Administered 2022-02-11: 1000 mL via INTRAVENOUS

## 2022-02-11 MED ORDER — ONDANSETRON HCL 4 MG/2ML IJ SOLN
4.0000 mg | Freq: Once | INTRAMUSCULAR | Status: AC
  Administered 2022-02-11: 4 mg via INTRAVENOUS
  Filled 2022-02-11: qty 2

## 2022-02-11 MED ORDER — DULOXETINE HCL 20 MG PO CPEP
20.0000 mg | ORAL_CAPSULE | Freq: Every day | ORAL | Status: DC
  Administered 2022-02-11 – 2022-02-14 (×4): 20 mg via ORAL
  Filled 2022-02-11 (×4): qty 1

## 2022-02-11 MED ORDER — LORAZEPAM 2 MG/ML IJ SOLN
0.5000 mg | Freq: Once | INTRAMUSCULAR | Status: AC
  Administered 2022-02-11: 0.5 mg via INTRAVENOUS
  Filled 2022-02-11: qty 1

## 2022-02-11 MED ORDER — KETOROLAC TROMETHAMINE 15 MG/ML IJ SOLN
15.0000 mg | Freq: Four times a day (QID) | INTRAMUSCULAR | Status: DC
  Administered 2022-02-11 – 2022-02-14 (×12): 15 mg via INTRAVENOUS
  Filled 2022-02-11 (×13): qty 1

## 2022-02-11 MED ORDER — SODIUM CHLORIDE 0.9% FLUSH: 9.0000 mL | INTRAVENOUS | Status: DC | PRN

## 2022-02-11 MED ORDER — DIPHENHYDRAMINE HCL 25 MG PO CAPS
25.0000 mg | ORAL_CAPSULE | ORAL | Status: DC | PRN
  Administered 2022-02-11: 25 mg via ORAL
  Filled 2022-02-11: qty 1

## 2022-02-11 MED ORDER — SODIUM CHLORIDE 0.45 % IV SOLN: INTRAVENOUS | Status: DC

## 2022-02-11 MED ORDER — ENOXAPARIN SODIUM 40 MG/0.4ML IJ SOSY
40.0000 mg | PREFILLED_SYRINGE | INTRAMUSCULAR | Status: DC
  Administered 2022-02-11 – 2022-02-13 (×3): 40 mg via SUBCUTANEOUS
  Filled 2022-02-11 (×3): qty 0.4

## 2022-02-11 MED ORDER — HYDROMORPHONE HCL 1 MG/ML IJ SOLN
1.0000 mg | Freq: Once | INTRAMUSCULAR | Status: AC
  Administered 2022-02-11: 1 mg via INTRAVENOUS
  Filled 2022-02-11: qty 1

## 2022-02-11 MED ORDER — HYDROMORPHONE HCL 2 MG/ML IJ SOLN
2.0000 mg | INTRAMUSCULAR | Status: DC | PRN
  Administered 2022-02-11: 2 mg via INTRAVENOUS
  Filled 2022-02-11: qty 1

## 2022-02-11 MED ORDER — HYDROMORPHONE 1 MG/ML IV SOLN
INTRAVENOUS | Status: DC
  Filled 2022-02-11: qty 30

## 2022-02-11 MED ORDER — OXYCODONE HCL ER 10 MG PO T12A
10.0000 mg | EXTENDED_RELEASE_TABLET | Freq: Two times a day (BID) | ORAL | Status: DC
  Filled 2022-02-11: qty 1

## 2022-02-11 MED ORDER — ONDANSETRON HCL 4 MG/2ML IJ SOLN: 4.0000 mg | Freq: Four times a day (QID) | INTRAMUSCULAR | Status: DC | PRN

## 2022-02-11 MED ORDER — KETOROLAC TROMETHAMINE 30 MG/ML IJ SOLN
30.0000 mg | Freq: Once | INTRAMUSCULAR | Status: AC
  Administered 2022-02-11: 30 mg via INTRAVENOUS
  Filled 2022-02-11: qty 1

## 2022-02-11 MED ORDER — GABAPENTIN 400 MG PO CAPS
400.0000 mg | ORAL_CAPSULE | Freq: Three times a day (TID) | ORAL | Status: DC
  Administered 2022-02-11 – 2022-02-14 (×10): 400 mg via ORAL
  Filled 2022-02-11 (×11): qty 1

## 2022-02-11 MED ORDER — OXYCODONE HCL ER 10 MG PO T12A
10.0000 mg | EXTENDED_RELEASE_TABLET | Freq: Two times a day (BID) | ORAL | Status: DC
  Administered 2022-02-11 – 2022-02-14 (×6): 10 mg via ORAL
  Filled 2022-02-11 (×6): qty 1

## 2022-02-11 NOTE — ED Provider Notes (Signed)
[facility]EMERGENCY DEPT Provider Note   CSN: [id number] Arrival date & time: 02/11/22  8366     History  Chief Complaint  Patient presents with   Sickle Cell Pain Crisis    Lindsey [name] is a 51 y.o. female.  Patient has a history of sickle cell disease.  Patient complains of left shoulder pain and left arm pain this is typical for her pain.  She has not been improving with her pain medicines at home  The history is provided by the patient and medical records. No language interpreter was used.  Sickle Cell Pain Crisis Pain location: Left shoulder and left leg. Severity:  Severe Onset quality:  Sudden Similar to previous crisis episodes: yes   Timing:  Constant Progression:  Worsening Chronicity:  Recurrent History of pulmonary emboli: no   Context: not alcohol consumption   Relieved by:  Prescription drugs Worsened by:  Nothing Ineffective treatments:  Prescription drugs Associated symptoms: no chest pain, no congestion, no cough, no fatigue and no headaches        Home Medications Prior to Admission medications   Medication Sig Start Date End Date Taking? Authorizing Provider  ALPRAZolam (XANAX) 0.25 MG tablet Take 1 tablet (0.25 mg total) by mouth 2 (two) times daily as needed for anxiety. 12/31/21   Rometta Emery, MD  diphenhydrAMINE (BENADRYL ALLERGY) 25 MG tablet Take 50 mg by mouth at bedtime as needed for sleep.    [provider]  DULoxetine (CYMBALTA) 20 MG capsule Take 1 capsule (20 mg total) by mouth daily. 12/31/21 12/31/22  Rometta Emery, MD  folic acid (FOLVITE) 1 MG tablet Take 1 tablet (1 mg total) by mouth daily. 12/31/21 12/31/22  Rometta Emery, MD  gabapentin (NEURONTIN) 400 MG capsule Take 1 capsule (400 mg total) by mouth 3 (three) times daily. 01/28/22   Massie Maroon, FNP  oxyCODONE (OXYCONTIN) 10 mg 12 hr tablet Take 1 tablet (10 mg total) by mouth every 12 (twelve) hours. 02/04/22   Massie Maroon, FNP  Oxycodone HCl 10 MG TABS Take 1 tablet (10 mg total) by mouth every 6 (six) hours as needed for up to 15 days (for moderate to severe pain). 01/31/22 02/15/22  Massie Maroon, FNP      Allergies    Ketamine    Review of Systems   Review of Systems  Constitutional:  Negative for appetite change and fatigue.  HENT:  Negative for congestion, ear discharge and sinus pressure.   Eyes:  Negative for discharge.  Respiratory:  Negative for cough.   Cardiovascular:  Negative for chest pain.  Gastrointestinal:  Negative for abdominal pain and diarrhea.  Genitourinary:  Negative for frequency and hematuria.  Musculoskeletal:  Negative for back pain.       Left shoulder and left leg pain  Skin:  Negative for rash.  Neurological:  Negative for seizures and headaches.  Psychiatric/Behavioral:  Negative for hallucinations.     Physical Exam Updated Vital Signs BP 105/73   Pulse 68   Temp 98.4 F (36.9 C)   Resp 18   Ht 5' (1.524 m)   Wt 63.5 kg   LMP 07/09/2021 (Approximate)   SpO2 95%   BMI 27.34 kg/m  Physical Exam Vitals reviewed.  Constitutional:      Appearance: She is well-developed. She is ill-appearing.  HENT:     Head: Normocephalic.     Nose: Nose normal.  Eyes:     General:  No scleral icterus.    Conjunctiva/sclera: Conjunctivae normal.  Neck:     Thyroid: No thyromegaly.  Cardiovascular:     Rate and Rhythm: Normal rate and regular rhythm.     Heart sounds: No murmur heard.    No friction rub. No gallop.  Pulmonary:     Breath sounds: No stridor. No wheezing or rales.  Chest:     Chest wall: No tenderness.  Abdominal:     General: There is no distension.     Tenderness: There is no abdominal tenderness. There is no rebound.  Musculoskeletal:        General: Normal range of motion.     Cervical back: Neck supple.  Lymphadenopathy:     Cervical: No cervical adenopathy.  Skin:    Findings: No erythema or rash.  Neurological:     Mental Status:  She is alert and oriented to person, place, and time.     Motor: No abnormal muscle tone.     Coordination: Coordination normal.  Psychiatric:        Behavior: Behavior normal.     ED Results / Procedures / Treatments   Labs (all labs ordered are listed, but only abnormal results are displayed) Labs Reviewed  CBC WITH DIFFERENTIAL/PLATELET - Abnormal; Notable for the following components:      Result Value   RBC 3.61 (*)    Hemoglobin 11.1 (*)    HCT 30.4 (*)    MCHC 36.5 (*)    Platelets 478 (*)    nRBC 0.3 (*)    Lymphs Abs 4.5 (*)    All other components within normal limits  COMPREHENSIVE METABOLIC PANEL - Abnormal; Notable for the following components:   Chloride 112 (*)    CO2 21 (*)    Glucose, Bld 106 (*)    Total Bilirubin 1.4 (*)    All other components within normal limits  RETICULOCYTES - Abnormal; Notable for the following components:   Retic Ct Pct 4.3 (*)    RBC. 3.61 (*)    Immature Retic Fract 23.7 (*)    All other components within normal limits    EKG None  Radiology No results found.  Procedures Procedures    Medications Ordered in ED Medications  HYDROmorphone (DILAUDID) injection 2 mg (has no administration in time range)  sodium chloride 0.9 % bolus 1,000 mL (1,000 mLs Intravenous New Bag/Given 02/11/22 0733)  HYDROmorphone (DILAUDID) injection 1 mg (1 mg Intravenous Given 02/11/22 0728)  ondansetron (ZOFRAN) injection 4 mg (4 mg Intravenous Given 02/11/22 0728)  ketorolac (TORADOL) 30 MG/ML injection 30 mg (30 mg Intravenous Given 02/11/22 0726)  LORazepam (ATIVAN) injection 0.5 mg (0.5 mg Intravenous Given 02/11/22 0732)  HYDROmorphone (DILAUDID) injection 1 mg (1 mg Intravenous Given 02/11/22 0816)  HYDROmorphone (DILAUDID) injection 2 mg (2 mg Intravenous Given 02/11/22 0855)    ED Course/ Medical Decision Making/ A&P  CRITICAL CARE Performed by: Milton Ferguson Total critical care time: 40 minutes Critical care time was exclusive of  separately billable procedures and treating other patients. Critical care was necessary to treat or prevent imminent or life-threatening deterioration. Critical care was time spent personally by me on the following activities: development of treatment plan with patient and/or surrogate as well as nursing, discussions with consultants, evaluation of patient's response to treatment, examination of patient, obtaining history from patient or surrogate, ordering and performing treatments and interventions, ordering and review of laboratory studies, ordering and review of radiographic studies, pulse oximetry and re-evaluation  of patient's condition.                          Medical Decision Making Amount and/or Complexity of Data Reviewed Labs: ordered. ECG/medicine tests: ordered.  Risk Prescription drug management. Decision regarding hospitalization.  This patient presents to the ED for concern of sickle pain, this involves an extensive number of treatment options, and is a complaint that carries with it a high risk of complications and morbidity.  The differential diagnosis includes sickle cell crisis, chronic pain syndrome   Co morbidities that complicate the patient evaluation  Sickle cell disease   Additional history obtained:  Additional history obtained from family External records from outside source obtained and reviewed including hospital records   Lab Tests:  I Ordered, and personally interpreted labs.  The pertinent results include: CBC shows hemoglobin 11.1 and white count 9.8, retake count 23.7   Imaging Studies ordered: No imaging Cardiac Monitoring: / EKG:  The patient was maintained on a cardiac monitor.  I personally viewed and interpreted the cardiac monitored which showed an underlying rhythm of: Normal sinus rhythm   Consultations Obtained:  I requested consultation with the admitting sickle cell team,  and discussed lab and imaging findings as well as  pertinent plan - they recommend: Admit and pain control   Problem List / ED Course / Critical interventions / Medication management  Sickle cell pain I ordered medication including Dilaudid and Toradol Reevaluation of the patient after these medicines showed that the patient stayed the same I have reviewed the patients home medicines and have made adjustments as needed   Social Determinants of Health:  None   Test / Admission - Considered:  No other test necessary  Patient with sickle cell crisis that not improved by treatment in the emergency department she will be admitted to the sickle cell team        Final Clinical Impression(s) / ED Diagnoses Final diagnoses:  Sickle cell pain crisis [facility])    Rx / DC Orders ED Discharge Orders     None         Bethann Berkshire, MD 02/11/22 [phone]

## 2022-02-11 NOTE — H&P (Signed)
H&P  Patient Demographics:  Lindsey [name], is a 51 y.o. female  MRN: [id number]   DOB - 1971-02-25  Admit Date - 02/11/2022  Outpatient Primary MD for the patient is Dorena Dew, FNP  Chief Complaint  Patient presents with   Sickle Cell Pain Crisis      HPI:   Lindsey [name]  is a 51 y.o. female with a medical history significant for sickle cell disease, chronic pain syndrome, opiate dependence and tolerance, and history of anemia of chronic disease presents to the emergency department with complaints of left shoulder and bilateral lower extremity pain over the past several days. Patient says that pain has been increasing in intensity and unrelieved by her home medications.  She rates her pain as 9/10, constant, and throbbing.  Patient last had oxycodone and OxyContin this a.m. without sustained relief.  She denies headache, chest pain, shortness of breath, or dizziness.  No urinary symptoms, nausea, vomiting, or diarrhea.  No sick contacts, recent travel, or known exposure to COVID-19.  ER course: While in ER, vital signs remained stable.  Reviewed all laboratory values, largely consistent with patient's baseline.  Pain persists following IV Dilaudid, IV Toradol, and IV fluids, patient will be admitted to [facility] for further management of sickle cell pain crisis.   Review of systems:  In addition to the HPI above, patient reports No fever or chills No Headache, No changes with vision or hearing No problems swallowing food or liquids No chest pain, cough or shortness of breath No abdominal pain, No nausea or vomiting, Bowel movements are regular No blood in stool or urine No dysuria No new skin rashes or bruises No new joints pains-aches No new weakness, tingling, numbness in any extremity No recent weight gain or loss No polyuria, polydypsia or polyphagia No significant Mental Stressors  With Past History of the following :   Past Medical History:  Diagnosis Date    Acute kidney injury ([place]) 01/18/2021   Acute pain of left shoulder    Cough 06/27/2020   Hypokalemia 06/27/2020   Hypoxia    Leukocytosis 09/03/2016   Opiate abuse, episodic ([place]) 09/25/2017   Opioid dependence in remission ([place])    Right leg pain    Sickle cell crisis ([place])    Vasoocclusive sickle cell crisis ([place]) 07/17/2021      Past Surgical History:  Procedure Laterality Date   APPENDECTOMY     CESAREAN SECTION     OTHER SURGICAL HISTORY     c-section     Social History:   Social History   Tobacco Use   Smoking status: Some Days    Packs/day: 0.00    Types: Cigars, Cigarettes   Smokeless tobacco: Never  Substance Use Topics   Alcohol use: Yes    Comment: occasionally     Lives - At home   Family History :   Family History  Problem Relation Age of Onset   Stroke Neg Hx        none that she knows of    Seizures Neg Hx      Home Medications:   Prior to Admission medications   Medication Sig Start Date End Date Taking? Authorizing Provider  ALPRAZolam (XANAX) 0.25 MG tablet Take 1 tablet (0.25 mg total) by mouth 2 (two) times daily as needed for anxiety. 12/31/21   Elwyn Reach, MD  diphenhydrAMINE (BENADRYL ALLERGY) 25 MG tablet Take 50 mg by mouth at bedtime as needed for sleep.  [provider]  DULoxetine (CYMBALTA) 20 MG capsule Take 1 capsule (20 mg total) by mouth daily. 12/31/21 12/31/22  Rometta Emery, MD  folic acid (FOLVITE) 1 MG tablet Take 1 tablet (1 mg total) by mouth daily. 12/31/21 12/31/22  Rometta Emery, MD  gabapentin (NEURONTIN) 400 MG capsule Take 1 capsule (400 mg total) by mouth 3 (three) times daily. 01/28/22   Massie Maroon, FNP  oxyCODONE (OXYCONTIN) 10 mg 12 hr tablet Take 1 tablet (10 mg total) by mouth every 12 (twelve) hours. 02/04/22   Massie Maroon, FNP  Oxycodone HCl 10 MG TABS Take 1 tablet (10 mg total) by mouth every 6 (six) hours as needed for up to 15 days (for moderate to severe pain). 01/31/22 02/15/22   Massie Maroon, FNP     Allergies:   Allergies  Allergen Reactions   Ketamine Anxiety and Other (See Comments)    Tachycardia     Physical Exam:   Vitals:   Vitals:   02/11/22 0800 02/11/22 0845  BP: 107/71 105/73  Pulse: 68 68  Resp: 18 18  Temp:    SpO2: 98% 95%    Physical Exam: Constitutional: Patient appears well-developed and well-nourished. Not in obvious distress. HENT: Normocephalic, atraumatic, External right and left ear normal. Oropharynx is clear and moist.  Eyes: Conjunctivae and EOM are normal. PERRLA, no scleral icterus. Neck: Normal ROM. Neck supple. No JVD. No tracheal deviation. No thyromegaly. CVS: RRR, S1/S2 +, no murmurs, no gallops, no carotid bruit.  Pulmonary: Effort and breath sounds normal, no stridor, rhonchi, wheezes, rales.  Abdominal: Soft. BS +, no distension, tenderness, rebound or guarding.  Musculoskeletal: Normal range of motion. No edema and no tenderness.  Lymphadenopathy: No lymphadenopathy noted, cervical, inguinal or axillary Neuro: Alert. Normal reflexes, muscle tone coordination. No cranial nerve deficit. Skin: Skin is warm and dry. No rash noted. Not diaphoretic. No erythema. No pallor. Psychiatric: Normal mood and affect. Behavior, judgment, thought content normal.   Data Review:   CBC Recent Labs  Lab 02/11/22 0709  WBC 9.8  HGB 11.1*  HCT 30.4*  PLT 478*  MCV 84.2  MCH 30.7  MCHC 36.5*  RDW 14.9  LYMPHSABS 4.5*  MONOABS 0.6  EOSABS 0.2  BASOSABS 0.1   ------------------------------------------------------------------------------------------------------------------  Chemistries  Recent Labs  Lab 02/11/22 0709  NA 141  K 4.3  CL 112*  CO2 21*  GLUCOSE 106*  BUN 10  CREATININE 0.81  CALCIUM 9.2  AST 28  ALT 18  ALKPHOS 43  BILITOT 1.4*   ------------------------------------------------------------------------------------------------------------------ estimated creatinine clearance is 69.1  mL/min (by C-G formula based on SCr of 0.81 mg/dL). ------------------------------------------------------------------------------------------------------------------ No results for input(s): "TSH", "T4TOTAL", "T3FREE", "THYROIDAB" in the last 72 hours.  Invalid input(s): "FREET3"  Coagulation profile No results for input(s): "INR", "PROTIME" in the last 168 hours. ------------------------------------------------------------------------------------------------------------------- No results for input(s): "DDIMER" in the last 72 hours. -------------------------------------------------------------------------------------------------------------------  Cardiac Enzymes No results for input(s): "CKMB", "TROPONINI", "MYOGLOBIN" in the last 168 hours.  Invalid input(s): "CK" ------------------------------------------------------------------------------------------------------------------ No results found for: "BNP"  ---------------------------------------------------------------------------------------------------------------  Urinalysis    Component Value Date/Time   COLORURINE YELLOW 05/31/2021 1350   APPEARANCEUR TURBID (A) 05/31/2021 1350   LABSPEC 1.012 05/31/2021 1350   PHURINE 5.0 05/31/2021 1350   GLUCOSEU NEGATIVE 05/31/2021 1350   HGBUR SMALL (A) 05/31/2021 1350   BILIRUBINUR NEGATIVE 05/31/2021 1350   BILIRUBINUR neg 04/07/2020 1014   KETONESUR NEGATIVE 05/31/2021 1350   PROTEINUR 100 (A) 05/31/2021 1350  UROBILINOGEN 0.2 04/07/2020 1014   UROBILINOGEN 1.0 12/25/2012 1755   NITRITE NEGATIVE 05/31/2021 1350   LEUKOCYTESUR MODERATE (A) 05/31/2021 1350    ----------------------------------------------------------------------------------------------------------------   Imaging Results:    No results found.   Assessment & Plan:  Principal Problem:   Sickle cell pain crisis (HCC) Active Problems:   Chronic pain   Anemia of chronic disease   Anxiety and  depression  Sickle cell disease with pain crisis: Admit to MedSurg, initiate IV Dilaudid PCA with settings of 0.5 mg, 10-minute lockout, and 3 mg/h. IV Toradol 15 mg every 6 hours OxyContin 10 mg every 12 hours IV fluids, 0.45% saline at 100 mL/h Monitor vital signs very closely, reevaluate pain scale regularly, and supplemental oxygen as needed Patient will be reevaluated for pain in the context of function and relationship to baseline as care progresses.  Chronic pain syndrome: Continue home medications  Anemia of chronic disease: Hemoglobin is stable and consistent with patient's baseline, there is no clinical indication for blood transfusion at this time.  Monitor labs in AM.  Depression/anxiety: Continue Cymbalta.  No suicidal homicidal ideations.  Monitor closely.  DVT Prophylaxis: Subcut Lovenox   AM Labs Ordered, also please review Full Orders  Family Communication: Admission, patient's condition and plan of care including tests being ordered have been discussed with the patient who indicate understanding and agree with the plan and Code Status.  Code Status: Full Code  Consults called: None    Admission status: Inpatient    Time spent in minutes : 30 minutes   [name]  APRN, MSN, FNP-C [facility] [address] [place], [place] [address] [phone]  02/11/2022 at 9:28 AM

## 2022-02-11 NOTE — ED Triage Notes (Signed)
Ambulatory to ED with c/o SCC x 2 days. States her pain is in her leg and her shoulder. Vomiting in triage.

## 2022-02-12 DIAGNOSIS — D5700 Hb-SS disease with crisis, unspecified: Secondary | ICD-10-CM

## 2022-02-12 LAB — CBC
HCT: 24.1 % — ABNORMAL LOW (ref 36.0–46.0)
Hemoglobin: 8.9 g/dL — ABNORMAL LOW (ref 12.0–15.0)
MCH: 31.1 pg (ref 26.0–34.0)
MCHC: 36.9 g/dL — ABNORMAL HIGH (ref 30.0–36.0)
MCV: 84.3 fL (ref 80.0–100.0)
Platelets: 328 10*3/uL (ref 150–400)
RBC: 2.86 MIL/uL — ABNORMAL LOW (ref 3.87–5.11)
RDW: 15.2 % (ref 11.5–15.5)
WBC: 10.6 10*3/uL — ABNORMAL HIGH (ref 4.0–10.5)
nRBC: 0.3 % — ABNORMAL HIGH (ref 0.0–0.2)

## 2022-02-12 MED ORDER — NALOXONE HCL 0.4 MG/ML IJ SOLN: 0.4000 mg | INTRAMUSCULAR | Status: DC | PRN

## 2022-02-12 MED ORDER — HYDROMORPHONE HCL 1 MG/ML IJ SOLN
0.5000 mg | INTRAMUSCULAR | Status: DC | PRN
  Administered 2022-02-12 – 2022-02-14 (×17): 1 mg via INTRAVENOUS
  Filled 2022-02-12 (×18): qty 1

## 2022-02-12 NOTE — Progress Notes (Signed)
       CROSS COVER NOTE  NAME: Lindsey [name] MRN: [id number] DOB : [birth year]    Date of Service   02/12/2022   HPI/Events of Note   0100:  Notified by bedside RN of ongoing bradypnea.  RR ~ 5-6 when sleeping, but when awake RR ~12.  Per RN this has been ongoing since the beginning of shift (1900).   Patient was evaluated at bedside.  She is alert and oriented x4 and is able to express that she has 7 out of 10 pain.  Unfortunately, due to her low RR, PCA pump is not able to administer pain medicine. Complication was explained to the patient and she was in agreements to be taken off of the PCA pump.  In order to continue treating her pain without putting her in harm's way of respiratory complications, pain regimen is being changed.  She will continue to receive Dilaudid IV push 0.5 to 1 mg as needed every 2 hours.  Bedside RN was asked to notify me if there are any complications managing pain.    Interventions/ Plan   Dilaudid IV push       Raenette Rover, DNP, [place]

## 2022-02-12 NOTE — Progress Notes (Signed)
Pt's PCA is paused due to low respiration rate 5-6/min. The risks explained to pt and she verbalized understanding. [place] NP on call was notified

## 2022-02-12 NOTE — Progress Notes (Signed)
Mobility Specialist - Progress Note   02/12/22 1059  Mobility  Activity Ambulated with assistance in hallway  Activity Response Tolerated well  Distance Ambulated (ft) 500 ft  $Mobility charge 1 Mobility  Level of Assistance Independent after set-up  Assistive Device Front wheel walker  [facility] Elevated/Bed Position Self regulated  Range of Motion/Exercises Active  Transport method Ambulatory  Mobility Referral Yes   Pt received in bed and agreeable to mobility. No complaints during ambulation. Pt to bed after session with all needs met.    [facility]

## 2022-02-12 NOTE — Plan of Care (Signed)
  Problem: Education: Goal: Knowledge of vaso-occlusive preventative measures will improve Outcome: Progressing Goal: Awareness of infection prevention will improve Outcome: Progressing Goal: Awareness of signs and symptoms of anemia will improve Outcome: Progressing Goal: Long-term complications will improve Outcome: Progressing   Problem: Self-Care: Goal: Ability to incorporate actions that prevent/reduce pain crisis will improve Outcome: Progressing   Problem: Bowel/Gastric: Goal: Gut motility will be maintained Outcome: Progressing   Problem: Tissue Perfusion: Goal: Complications related to inadequate tissue perfusion will be avoided or minimized Outcome: Progressing   Problem: Respiratory: Goal: Pulmonary complications will be avoided or minimized Outcome: Progressing Goal: Acute Chest Syndrome will be identified early to prevent complications Outcome: Progressing   Problem: Fluid Volume: Goal: Ability to maintain a balanced intake and output will improve Outcome: Progressing   Problem: Sensory: Goal: Pain level will decrease with appropriate interventions Outcome: Progressing   Problem: Health Behavior: Goal: Postive changes in compliance with treatment and prescription regimens will improve Outcome: Progressing   Problem: Education: Goal: Knowledge of General Education information will improve Description: Including pain rating scale, medication(s)/side effects and non-pharmacologic comfort measures Outcome: Progressing   Problem: Health Behavior/Discharge Planning: Goal: Ability to manage health-related needs will improve Outcome: Progressing   Problem: Clinical Measurements: Goal: Ability to maintain clinical measurements within normal limits will improve Outcome: Progressing Goal: Will remain free from infection Outcome: Progressing Goal: Diagnostic test results will improve Outcome: Progressing Goal: Respiratory complications will improve Outcome:  Progressing Goal: Cardiovascular complication will be avoided Outcome: Progressing   Problem: Activity: Goal: Risk for activity intolerance will decrease Outcome: Progressing   Problem: Nutrition: Goal: Adequate nutrition will be maintained Outcome: Progressing   Problem: Coping: Goal: Level of anxiety will decrease Outcome: Progressing

## 2022-02-12 NOTE — Progress Notes (Signed)
SICKLE CELL SERVICE PROGRESS NOTE  Lindsey [name] KCL:[id number] DOB: 09-21-1970 DOA: 02/11/2022 PCP: Massie Maroon, FNP  Assessment/Plan: Principal Problem:   Sickle cell pain crisis [facility]) Active Problems:   Chronic pain   Anemia of chronic disease   Anxiety and depression  Sickle cell pain crisis: Patient still having pain at 7 out of 10.  Mainly localized to the left shoulder she does not feel comfortable discharging home.  She is currently not on Dilaudid PCA but oral medications as well as shortness of Dilaudid.  We will continue this therapy.  Monitor closely Anemia of chronic disease: Monitor H&H Chronic pain syndrome: Continue long-acting home medications Anxiety with depression: Continue home regimen  Code Status: Full code Family Communication: No family at bedside Disposition Plan: Home  [facility]  Pager [phone]. If 7PM-7AM, please contact night-coverage.  02/12/2022, 11:41 AM  LOS: 1 day   Brief narrative:  Lindsey [name]  is a 51 y.o. female with a medical history significant for sickle cell disease, chronic pain syndrome, opiate dependence and tolerance, and history of anemia of chronic disease presents to the emergency department with complaints of left shoulder and bilateral lower extremity pain over the past several days. Patient says that pain has been increasing in intensity and unrelieved by her home medications.  She rates her pain as 9/10, constant, and throbbing.  Patient last had oxycodone and OxyContin this a.m. without sustained relief.  She denies headache, chest pain, shortness of breath, or dizziness.  No urinary symptoms, nausea, vomiting, or diarrhea.  No sick contacts, recent travel, or known exposure to COVID-19.  Consultants: None  Procedures: None  Antibiotics: None  HPI/Subjective: Patient still having 6 out of 10 pain in the shoulder.  No other complaint.  She has been taking medications.  Objective: Vitals:   02/12/22  0021 02/12/22 0039 02/12/22 0501 02/12/22 1030  BP:  (!) 93/59 99/64 (!) 103/51  Pulse:  71 78 72  Resp: (!) 6 18 16 18   Temp:  97.6 F (36.4 C) 97.9 F (36.6 C) 98.2 F (36.8 C)  TempSrc:  Oral Oral Oral  SpO2: 98% 97% 92% 93%  Weight:      Height:       Weight change:   Intake/Output Summary (Last 24 hours) at 02/12/2022 1141 Last data filed at 02/12/2022 0500 Gross per 24 hour  Intake 317.35 ml  Output --  Net 317.35 ml    General: Alert, awake, oriented x3, in no acute distress.  HEENT: [place]/AT PEERL, EOMI Neck: Trachea midline,  no masses, no thyromegal,y no JVD, no carotid bruit OROPHARYNX:  Moist, No exudate/ erythema/lesions.  Heart: Regular rate and rhythm, without murmurs, rubs, gallops, PMI non-displaced, no heaves or thrills on palpation.  Lungs: Clear to auscultation, no wheezing or rhonchi noted. No increased vocal fremitus resonant to percussion  Abdomen: Soft, nontender, nondistended, positive bowel sounds, no masses no hepatosplenomegaly noted..  Neuro: No focal neurological deficits noted cranial nerves II through XII grossly intact. DTRs 2+ bilaterally upper and lower extremities. Strength 5 out of 5 in bilateral upper and lower extremities. Musculoskeletal: No warm swelling or erythema around joints, no spinal tenderness noted. Psychiatric: Patient alert and oriented x3, good insight and cognition, good recent to remote recall. Lymph node survey: No cervical axillary or inguinal lymphadenopathy noted.   Data Reviewed: Basic Metabolic Panel: Recent Labs  Lab 02/11/22 0709  NA 141  K 4.3  CL 112*  CO2 21*  GLUCOSE 106*  BUN  10  CREATININE 0.81  CALCIUM 9.2   Liver Function Tests: Recent Labs  Lab 02/11/22 0709  AST 28  ALT 18  ALKPHOS 43  BILITOT 1.4*  PROT 7.7  ALBUMIN 4.4   No results for input(s): "LIPASE", "AMYLASE" in the last 168 hours. No results for input(s): "AMMONIA" in the last 168 hours. CBC: Recent Labs  Lab 02/11/22 0709  02/12/22 0648  WBC 9.8 10.6*  NEUTROABS 4.4  --   HGB 11.1* 8.9*  HCT 30.4* 24.1*  MCV 84.2 84.3  PLT 478* 328   Cardiac Enzymes: No results for input(s): "CKTOTAL", "CKMB", "CKMBINDEX", "TROPONINI" in the last 168 hours. BNP (last 3 results) No results for input(s): "BNP" in the last 8760 hours.  ProBNP (last 3 results) No results for input(s): "PROBNP" in the last 8760 hours.  CBG: No results for input(s): "GLUCAP" in the last 168 hours.  No results found for this or any previous visit (from the past 240 hour(s)).   Studies: No results found.  Scheduled Meds:  DULoxetine  20 mg Oral Daily   enoxaparin (LOVENOX) injection  40 mg Subcutaneous N56O   folic acid  1 mg Oral Daily   gabapentin  400 mg Oral TID   ketorolac  15 mg Intravenous Q6H   oxyCODONE  10 mg Oral Q12H   senna-docusate  1 tablet Oral BID   Continuous Infusions:  sodium chloride 75 mL/hr at 02/12/22 0524    Principal Problem:   Sickle cell pain crisis (HCC) Active Problems:   Chronic pain   Anemia of chronic disease   Anxiety and depression

## 2022-02-13 NOTE — Progress Notes (Signed)
Mobility Specialist Cancellation/Refusal Note:   02/13/22 1020  Mobility  Activity Refused mobility     Reason for Cancellation/Refusal: Pt declined mobility at this time. Requested ambulation after breakfast.  Will check back as schedule permits.    [facility]

## 2022-02-13 NOTE — Progress Notes (Signed)
SICKLE CELL SERVICE PROGRESS NOTE  Lindsey [name] FIE:[id number] DOB: 1970-10-15 DOA: 02/11/2022 PCP: Dorena Dew, FNP  Assessment/Plan: Principal Problem:   Sickle cell pain crisis [facility]) Active Problems:   Chronic pain   Anemia of chronic disease   Anxiety and depression  Sickle cell pain crisis: Patient still having pain at 6 out of 10.  Mainly localized to the left shoulder she does not feel comfortable discharging home.  She is currently not on Dilaudid PCA due to some low blood pressure.   Anemia of chronic disease: Monitor H&H Chronic pain syndrome: Continue long-acting home medications Anxiety with depression: Continue home regimen Disposition: Possible discharge in the morning.  Code Status: Full code Family Communication: No family at bedside Disposition Plan: [place]  Pager [phone]. If 7PM-7AM, please contact night-coverage.  02/13/2022, 11:32 PM  LOS: 2 days   Brief narrative:  Lindsey [name]  is a 51 y.o. female with a medical history significant for sickle cell disease, chronic pain syndrome, opiate dependence and tolerance, and history of anemia of chronic disease presents to the emergency department with complaints of left shoulder and bilateral lower extremity pain over the past several days. Patient says that pain has been increasing in intensity and unrelieved by her home medications.  She rates her pain as 9/10, constant, and throbbing.  Patient last had oxycodone and OxyContin this a.m. without sustained relief.  She denies headache, chest pain, shortness of breath, or dizziness.  No urinary symptoms, nausea, vomiting, or diarrhea.  No sick contacts, recent travel, or known exposure to COVID-19.  Consultants: None  Procedures: None  Antibiotics: None  HPI/Subjective: Patient still continued to have 6 out of 10 pain in the shoulder.  No other complaint.  She has been taking medications.  Objective: Vitals:   02/13/22 0623  02/13/22 1419 02/13/22 1843 02/13/22 2225  BP: (!) 101/52 124/67 (!) 107/56 121/80  Pulse: (!) 58 64 64 64  Resp: 18 20 18 20   Temp: 98.7 F (37.1 C) 98.3 F (36.8 C) 98.8 F (37.1 C) 98.6 F (37 C)  TempSrc: Oral Oral Oral Oral  SpO2: 95% 100% 96% 96%  Weight:      Height:       Weight change:   Intake/Output Summary (Last 24 hours) at 02/13/2022 2332 Last data filed at 02/13/2022 1806 Gross per 24 hour  Intake 3581.62 ml  Output --  Net 3581.62 ml     General: Alert, awake, oriented x3, in no acute distress.  HEENT: [place]/AT PEERL, EOMI Neck: Trachea midline,  no masses, no thyromegal,y no JVD, no carotid bruit OROPHARYNX:  Moist, No exudate/ erythema/lesions.  Heart: Regular rate and rhythm, without murmurs, rubs, gallops, PMI non-displaced, no heaves or thrills on palpation.  Lungs: Clear to auscultation, no wheezing or rhonchi noted. No increased vocal fremitus resonant to percussion  Abdomen: Soft, nontender, nondistended, positive bowel sounds, no masses no hepatosplenomegaly noted..  Neuro: No focal neurological deficits noted cranial nerves II through XII grossly intact. DTRs 2+ bilaterally upper and lower extremities. Strength 5 out of 5 in bilateral upper and lower extremities. Musculoskeletal: No warm swelling or erythema around joints, no spinal tenderness noted. Psychiatric: Patient alert and oriented x3, good insight and cognition, good recent to remote recall. Lymph node survey: No cervical axillary or inguinal lymphadenopathy noted.   Data Reviewed: Basic Metabolic Panel: Recent Labs  Lab 02/11/22 0709  NA 141  K 4.3  CL 112*  CO2 21*  GLUCOSE 106*  BUN 10  CREATININE 0.81  CALCIUM 9.2    Liver Function Tests: Recent Labs  Lab 02/11/22 0709  AST 28  ALT 18  ALKPHOS 43  BILITOT 1.4*  PROT 7.7  ALBUMIN 4.4    No results for input(s): "LIPASE", "AMYLASE" in the last 168 hours. No results for input(s): "AMMONIA" in the last 168  hours. CBC: Recent Labs  Lab 02/11/22 0709 02/12/22 0648  WBC 9.8 10.6*  NEUTROABS 4.4  --   HGB 11.1* 8.9*  HCT 30.4* 24.1*  MCV 84.2 84.3  PLT 478* 328    Cardiac Enzymes: No results for input(s): "CKTOTAL", "CKMB", "CKMBINDEX", "TROPONINI" in the last 168 hours. BNP (last 3 results) No results for input(s): "BNP" in the last 8760 hours.  ProBNP (last 3 results) No results for input(s): "PROBNP" in the last 8760 hours.  CBG: No results for input(s): "GLUCAP" in the last 168 hours.  No results found for this or any previous visit (from the past 240 hour(s)).   Studies: No results found.  Scheduled Meds:  DULoxetine  20 mg Oral Daily   enoxaparin (LOVENOX) injection  40 mg Subcutaneous Q24H   folic acid  1 mg Oral Daily   gabapentin  400 mg Oral TID   ketorolac  15 mg Intravenous Q6H   oxyCODONE  10 mg Oral Q12H   senna-docusate  1 tablet Oral BID   Continuous Infusions:  sodium chloride 75 mL/hr at 02/13/22 0808    Principal Problem:   Sickle cell pain crisis (HCC) Active Problems:   Chronic pain   Anemia of chronic disease   Anxiety and depression

## 2022-02-13 NOTE — Progress Notes (Signed)
Mobility Specialist - Progress Note   02/13/22 1410  Mobility  Activity Ambulated with assistance in hallway  Activity Response Tolerated well  Distance Ambulated (ft) 500 ft  $Mobility charge 1 Mobility  Level of Assistance Independent after set-up  Assistive Device  (IV Pole)  HOB Elevated/Bed Position Self regulated  Range of Motion/Exercises Active  Transport method Ambulatory  Mobility Referral Yes   Pt received in bed and agreeable to mobility. C/o leg pain during mobility. Pt to bed after session with all needs met.     [facility]

## 2022-02-14 ENCOUNTER — Other Ambulatory Visit: Payer: Self-pay | Admitting: Internal Medicine

## 2022-02-14 DIAGNOSIS — G894 Chronic pain syndrome: Secondary | ICD-10-CM

## 2022-02-14 DIAGNOSIS — D5700 Hb-SS disease with crisis, unspecified: Secondary | ICD-10-CM

## 2022-02-14 MED ORDER — OXYCODONE HCL 10 MG PO TABS: 10.0000 mg | ORAL_TABLET | Freq: Four times a day (QID) | ORAL | 0 refills | Status: DC | PRN

## 2022-02-14 NOTE — TOC Transition Note (Signed)
Transition of Care [facility]) - CM/SW Discharge Note   Patient Details  Name: Lindsey [name] MRN: [id number] Date of Birth: Jul 15, 1970  Transition of Care [facility]) CM/SW Contact:  Leeroy Cha, RN Phone Number: 02/14/2022, 11:08 AM   Clinical Narrative:    [id number] discharged to return home.  Chart reviewed for TOC needs.  None found.  Patient self care.   Final next level of care: Home/Self Care Barriers to Discharge: Barriers Resolved   Patient Goals and CMS Choice Patient states their goals for this hospitalization and ongoing recovery are:: to go home      Discharge Placement                       Discharge Plan and Services   Discharge Planning Services: CM Consult                                 Social Determinants of Health (SDOH) Interventions     Readmission Risk Interventions   Row Labels 02/14/2022   10:02 AM 11/30/2021   10:59 AM 10/29/2021    1:04 PM  Readmission Risk Prevention Plan   Section Header. No data exists in this row.     Transportation Screening   Complete Complete Complete  PCP or Specialist Appt within 3-5 Days     Complete  HRI or [place]     Complete  Social Work Consult for [place] Planning/Counseling     Complete  Palliative Care Screening     Not Applicable  Medication Review Press photographer)   Complete Complete Complete  PCP or Specialist appointment within 3-5 days of discharge   Complete Complete   HRI or Home Care Consult   Complete Complete   SW Recovery Care/Counseling Consult   Complete Complete   Palliative Care Screening   Not Applicable Not [place]   Not Applicable Not Applicable

## 2022-02-14 NOTE — Discharge Summary (Signed)
Physician Discharge Summary   Patient: Lindsey [name] MRN: [id number] DOB: [birth year]  Admit date:     02/11/2022  Discharge date: 02/14/2022  Discharge Physician: Barbette Merino   PCP: Dorena Dew, FNP   Recommendations at discharge:   Follow-up with your PCP.  Patient was admitted and was on Dilaudid PCA, Toradol, IV fluids.  Discharge Diagnoses: Principal Problem:   Sickle cell pain crisis ([place]) Active Problems:   Chronic pain   Anemia of chronic disease   Anxiety and depression  Resolved Problems:   * No resolved hospital problems. *  Hospital Course: Lindsey [name]  is a 51 y.o. female with a medical history significant for sickle cell disease, chronic pain syndrome, opiate dependence and tolerance, and history of anemia of chronic disease presents to the emergency department with complaints of left shoulder and bilateral lower extremity pain over the past several days. Patient says that pain has been increasing in intensity and unrelieved by her home medications.  She rates her pain as 9/10, constant, and throbbing.  Patient last had oxycodone and OxyContin this a.m. without sustained relief.  She denies headache, chest pain, shortness of breath, or dizziness.  No urinary symptoms, nausea, vomiting, or diarrhea.  No sick contacts, recent travel, or known exposure to COVID-19.  Patient was admitted, placed on Dilaudid PCA, Toradol, IV fluids.  Also titrated up pain regimen to oral agents.  Other supportive care.  Patient gradually got better.  Her pain went from 9 out of 10 to 4 out of 10.  She felt better and requested discharge home on home regimen.  She was therefore discharged home to follow-up with PCP on resume her home regimen.        Pain control - [employer] Controlled Substance Reporting System database was reviewed. and patient was instructed, not to drive, operate heavy machinery, perform activities at heights, swimming or participation in water  activities or provide baby-sitting services while on Pain, Sleep and Anxiety Medications; until their outpatient Physician has advised to do so again. Also recommended to not to take more than prescribed Pain, Sleep and Anxiety Medications.   Consultants: None Procedures performed: Chest x-ray Disposition: Home Diet recommendation:  Discharge Diet Orders (From admission, onward)     Start     Ordered   02/14/22 0000  Diet - low sodium heart healthy        02/14/22 1056           Regular diet DISCHARGE MEDICATION: Allergies as of 02/14/2022       Reactions   Ketamine Anxiety, Other (See Comments)   Tachycardia        Medication List     TAKE these medications    ALPRAZolam 0.25 MG tablet Commonly known as: XANAX Take 1 tablet (0.25 mg total) by mouth 2 (two) times daily as needed for anxiety.   Benadryl Allergy 25 MG tablet Generic drug: diphenhydrAMINE Take 50 mg by mouth at bedtime as needed for sleep.   DULoxetine 20 MG capsule Commonly known as: Cymbalta Take 1 capsule (20 mg total) by mouth daily.   folic acid 1 MG tablet Commonly known as: FOLVITE Take 1 tablet (1 mg total) by mouth daily.   gabapentin 400 MG capsule Commonly known as: NEURONTIN Take 1 capsule (400 mg total) by mouth 3 (three) times daily.   ibuprofen 200 MG tablet Commonly known as: ADVIL Take 400 mg by mouth every 6 (six) hours as needed for mild pain.   oxyCODONE  10 mg 12 hr tablet Commonly known as: OXYCONTIN Take 1 tablet (10 mg total) by mouth every 12 (twelve) hours. What changed: Another medication with the same name was removed. Continue taking this medication, and follow the directions you see here.        Discharge Exam: Filed Weights   02/11/22 0658  Weight: 63.5 kg   Constitutional: NAD, calm, comfortable Eyes: PERRL, lids and conjunctivae normal ENMT: Mucous membranes are moist. Posterior pharynx clear of any exudate or lesions.Normal dentition.  Neck:  normal, supple, no masses, no thyromegaly Respiratory: clear to auscultation bilaterally, no wheezing, no crackles. Normal respiratory effort. No accessory muscle use.  Cardiovascular: Regular rate and rhythm, no murmurs / rubs / gallops. No extremity edema. 2+ pedal pulses. No carotid bruits.  Abdomen: no tenderness, no masses palpated. No hepatosplenomegaly. Bowel sounds positive.  Musculoskeletal: Good range of motion, no joint swelling or tenderness, Skin: no rashes, lesions, ulcers. No induration Neurologic: CN 2-12 grossly intact. Sensation intact, DTR normal. Strength 5/5 in all 4.  Psychiatric: Normal judgment and insight. Alert and oriented x 3. Normal mood   Condition at discharge: good  The results of significant diagnostics from this hospitalization (including imaging, microbiology, ancillary and laboratory) are listed below for reference.   Imaging Studies: No results found.  Microbiology: Results for orders placed or performed during the hospital encounter of 01/28/22  MRSA Next Gen by PCR, Nasal     Status: Abnormal   Collection Time: 01/29/22 12:13 AM   Specimen: Nasal Mucosa; Nasal Swab  Result Value Ref Range Status   MRSA by PCR Next Gen DETECTED (A) NOT DETECTED Final    Comment: RESULT CALLED TO, READ BACK BY AND VERIFIED WITH: [place], R @ [id number] [id number] JMK (NOTE) The GeneXpert MRSA Assay (FDA approved for NASAL specimens only), is one component of a comprehensive MRSA colonization surveillance program. It is not intended to diagnose MRSA infection nor to guide or monitor treatment for MRSA infections. Test performance is not FDA approved in patients less than 52 years old. Performed at [facility], [address], [place], [place] [address]     Labs: CBC: No results for input(s): "WBC", "NEUTROABS", "HGB", "HCT", "MCV", "PLT" in the last 168 hours.  Basic Metabolic Panel: No results for input(s): "NA", "K", "CL", "CO2", "GLUCOSE", "BUN",  "CREATININE", "CALCIUM", "MG", "PHOS" in the last 168 hours.  Liver Function Tests: No results for input(s): "AST", "ALT", "ALKPHOS", "BILITOT", "PROT", "ALBUMIN" in the last 168 hours.  CBG: No results for input(s): "GLUCAP" in the last 168 hours.  Discharge time spent: greater than 30 minutes.  SignedLonia Blood, MD [facility] 02/27/2022

## 2022-02-14 NOTE — TOC Initial Note (Signed)
Transition of Care [facility]) - Initial/Assessment Note    Patient Details  Name: Ziare Orrick MRN: [id number] Date of Birth: [birth year]  Transition of Care [facility]) CM/SW Contact:    Golda Acre, RN Phone Number: 02/14/2022, 9:46 AM  Clinical Narrative:                  Transition of Care [facility]) Screening Note   Patient Details  Name: Keauna Brasel Date of Birth: Mar 05, 1971   Transition of Care [facility]) CM/SW Contact:    Golda Acre, RN Phone Number: 02/14/2022, 9:46 AM    Transition of Care Department [facility]) has reviewed patient and no TOC needs have been identified at this time. We will continue to monitor patient advancement through interdisciplinary progression rounds. If new patient transition needs arise, please place a TOC consult.    Expected Discharge Plan: Home/Self Care Barriers to Discharge: Continued Medical Work up   Patient Goals and CMS Choice Patient states their goals for this hospitalization and ongoing recovery are:: to go home      Expected Discharge Plan and Services Expected Discharge Plan: Home/Self Care   Discharge Planning Services: CM Consult   Living arrangements for the past 2 months: Single Family Home                                      Prior Living Arrangements/Services Living arrangements for the past 2 months: Single Family Home Lives with:: Spouse Patient language and need for interpreter reviewed:: Yes Do you feel safe going back to the place where you live?: Yes            Criminal Activity/Legal Involvement Pertinent to Current Situation/Hospitalization: No - Comment as needed  Activities of Daily Living Home Assistive Devices/Equipment: None ADL Screening (condition at time of admission) Patient's cognitive ability adequate to safely complete daily activities?: Yes Is the patient deaf or have difficulty hearing?: No Does the patient have difficulty seeing, even when wearing  glasses/contacts?: No Does the patient have difficulty concentrating, remembering, or making decisions?: No Patient able to express need for assistance with ADLs?: No Does the patient have difficulty dressing or bathing?: No Independently performs ADLs?: Yes (appropriate for developmental age) Does the patient have difficulty walking or climbing stairs?: Yes Weakness of Legs: Both Weakness of Arms/Hands: Both  Permission Sought/Granted                  Emotional Assessment Appearance:: Appears stated age Attitude/Demeanor/Rapport: Engaged Affect (typically observed): Calm Orientation: : Oriented to Self, Oriented to Place, Oriented to  Time, Oriented to Situation Alcohol / Substance Use: Never Used Psych Involvement: No (comment)  Admission diagnosis:  Sickle cell pain crisis (HCC) [D57.00] Patient Active Problem List   Diagnosis Date Noted   Sickle cell pain crisis (HCC) 02/11/2022   Acute sickle cell crisis (HCC) 01/28/2022   Anxiety and depression 12/29/2021   Anemia of chronic disease 10/29/2021   Sickle cell anemia (HCC) 09/10/2019   Sickle cell disease (HCC) 10/01/2017   Chronic pain 10/01/2017   Chronic narcotic use 09/25/2017   PCP:  Massie Maroon, FNP Pharmacy:   [id number] - [place], [place] - [address] AT [facility] [address] [place] [address] Phone: [phone] Fax: [phone]     Social Determinants of Health (SDOH) Interventions    Readmission  Risk Interventions   Row Labels 11/30/2021   10:59 AM 10/29/2021    1:04 PM 07/18/2021    2:32 PM  Readmission Risk Prevention Plan   Section Header. No data exists in this row.     Transportation Screening   Complete Complete Complete  PCP or Specialist Appt within 3-5 Days    Complete Complete  HRI or [place]    Complete Complete  Social Work Consult for [place] Planning/Counseling    Complete Complete  Palliative  Care Screening    Not Applicable Not Applicable  Medication Review Press photographer)   Complete Complete Complete  PCP or Specialist appointment within 3-5 days of discharge   Complete    HRI or [place]   Complete    SW Recovery Care/Counseling Consult   Complete    Palliative Care Screening   Not [place]   Not Applicable

## 2022-02-14 NOTE — Progress Notes (Signed)
Mobility Specialist Cancellation/Refusal Note:    02/14/22 0959  Mobility  Activity Refused mobility     Reason for Cancellation/Refusal: Pt declined mobility at this time.Pt mentioned catching up on sleep. Will check back as schedule permits.    [facility]

## 2022-02-23 ENCOUNTER — Other Ambulatory Visit: Payer: Self-pay | Admitting: Family Medicine

## 2022-02-23 DIAGNOSIS — G894 Chronic pain syndrome: Secondary | ICD-10-CM

## 2022-02-23 DIAGNOSIS — D5700 Hb-SS disease with crisis, unspecified: Secondary | ICD-10-CM

## 2022-02-23 MED ORDER — OXYCODONE HCL 10 MG PO TABS: 10.0000 mg | ORAL_TABLET | Freq: Four times a day (QID) | ORAL | 0 refills | Status: DC | PRN

## 2022-02-23 NOTE — Progress Notes (Signed)
Reviewed PDMP substance reporting system prior to prescribing opiate medications. No inconsistencies noted.  Meds ordered this encounter  Medications   Oxycodone HCl 10 MG TABS    Sig: Take 1 tablet (10 mg total) by mouth every 6 (six) hours as needed for up to 15 days (for moderate to severe pain).    Dispense:  60 tablet    Refill:  0    Order Specific Question:   Supervising Provider    Answer:   [name], [name]   Hila Bolding Moore Xion Debruyne  APRN, MSN, FNP-C [facility] [address]  [place], [place] [address] [phone]  

## 2022-02-27 NOTE — Hospital Course (Signed)
Lindsey [name]  is a 51 y.o. female with a medical history significant for sickle cell disease, chronic pain syndrome, opiate dependence and tolerance, and history of anemia of chronic disease presents to the emergency department with complaints of left shoulder and bilateral lower extremity pain over the past several days. Patient says that pain has been increasing in intensity and unrelieved by her home medications.  She rates her pain as 9/10, constant, and throbbing.  Patient last had oxycodone and OxyContin this a.m. without sustained relief.  She denies headache, chest pain, shortness of breath, or dizziness.  No urinary symptoms, nausea, vomiting, or diarrhea.  No sick contacts, recent travel, or known exposure to COVID-19.  Patient was admitted, placed on Dilaudid PCA, Toradol, IV fluids.  Also titrated up pain regimen to oral agents.  Other supportive care.  Patient gradually got better.  Her pain went from 9 out of 10 to 4 out of 10.  She felt better and requested discharge home on home regimen.  She was therefore discharged home to follow-up with PCP on resume her home regimen.

## 2022-02-28 ENCOUNTER — Other Ambulatory Visit: Payer: Self-pay | Admitting: Family Medicine

## 2022-02-28 DIAGNOSIS — G894 Chronic pain syndrome: Secondary | ICD-10-CM

## 2022-02-28 DIAGNOSIS — D5700 Hb-SS disease with crisis, unspecified: Secondary | ICD-10-CM

## 2022-02-28 MED ORDER — OXYCODONE HCL 10 MG PO TABS: 10.0000 mg | ORAL_TABLET | Freq: Four times a day (QID) | ORAL | 0 refills | Status: DC | PRN

## 2022-02-28 MED ORDER — OXYCODONE HCL ER 10 MG PO T12A: 10.0000 mg | EXTENDED_RELEASE_TABLET | Freq: Two times a day (BID) | ORAL | 0 refills | Status: DC

## 2022-02-28 NOTE — Telephone Encounter (Signed)
Reviewed PDMP substance reporting system prior to prescribing opiate medications. No inconsistencies noted.  Meds ordered this encounter  Medications   oxyCODONE (OXYCONTIN) 10 mg 12 hr tablet    Sig: Take 1 tablet (10 mg total) by mouth every 12 (twelve) hours.    Dispense:  60 tablet    Refill:  0    Order Specific Question:   Supervising Provider    Answer:   [name], [name]   Oxycodone HCl 10 MG TABS    Sig: Take 1 tablet (10 mg total) by mouth every 6 (six) hours as needed for up to 15 days (for moderate to severe pain).    Dispense:  60 tablet    Refill:  0    Order Specific Question:   Supervising Provider    Answer:   [name], [name]   Lindsey Partch Moore Milos Milligan  APRN, MSN, FNP-C [facility] [address]  [place], [place] [address] [phone]  

## 2022-03-02 ENCOUNTER — Telehealth: Payer: Self-pay

## 2022-03-02 NOTE — Telephone Encounter (Signed)
PA FOR OXYCODONE 10MG  APPROVED UNTIL 05/31/2022

## 2022-03-02 NOTE — Telephone Encounter (Signed)
OXYCODONE 10MG  [place], [place] TRACKS CONFIRMATION [phone] W

## 2022-03-03 ENCOUNTER — Other Ambulatory Visit: Payer: Self-pay

## 2022-03-07 ENCOUNTER — Other Ambulatory Visit: Payer: Self-pay

## 2022-03-07 ENCOUNTER — Telehealth: Payer: Self-pay

## 2022-03-07 NOTE — Telephone Encounter (Signed)
Pt was called to advise that PA was approved and med was sent in yesterday. [place]

## 2022-03-07 NOTE — Telephone Encounter (Signed)
PRIOR AUTH FOR OXYCONTIN SUBMITTED TO [place], CONFIRMATION #[id number] W

## 2022-03-08 ENCOUNTER — Telehealth: Payer: Self-pay

## 2022-03-08 ENCOUNTER — Other Ambulatory Visit: Payer: Self-pay

## 2022-03-08 NOTE — Telephone Encounter (Signed)
APPROVED UNTIL 06/05/2022

## 2022-03-08 NOTE — Telephone Encounter (Signed)
  Approved per Ander Purpura until 12/2022

## 2022-03-08 NOTE — Telephone Encounter (Signed)
Prior authorization for Gabapentin was submitted via [place] TRACKS with help from Medicaid Sickle Cell agent Lauren [phone]) today.  Confirmation # [id number] W

## 2022-03-14 ENCOUNTER — Other Ambulatory Visit: Payer: Self-pay | Admitting: Family Medicine

## 2022-03-14 DIAGNOSIS — D5700 Hb-SS disease with crisis, unspecified: Secondary | ICD-10-CM

## 2022-03-14 DIAGNOSIS — G894 Chronic pain syndrome: Secondary | ICD-10-CM

## 2022-03-14 MED ORDER — OXYCODONE HCL 10 MG PO TABS: 10.0000 mg | ORAL_TABLET | Freq: Four times a day (QID) | ORAL | 0 refills | Status: DC | PRN

## 2022-03-14 NOTE — Telephone Encounter (Signed)
Reviewed PDMP substance reporting system prior to prescribing opiate medications. No inconsistencies noted.  Meds ordered this encounter  Medications   Oxycodone HCl 10 MG TABS    Sig: Take 1 tablet (10 mg total) by mouth every 6 (six) hours as needed for up to 15 days (for moderate to severe pain).    Dispense:  60 tablet    Refill:  0    Order Specific Question:   Supervising Provider    Answer:   Tresa Garter [id number]   [name]  APRN, MSN, FNP-C Patient [place] [address] [place], [place] [address] [phone]

## 2022-03-14 NOTE — Telephone Encounter (Signed)
From: Tereasa Coop To: Office of [name], [place] Sent: 03/12/2022 8:11 PM EDT Subject: Medication Renewal Request  Refills have been requested for the following medications:   Oxycodone HCl 10 MG TABS [Jahmia Berrett]  Preferred pharmacy: [facility], [address] AT [place] Delivery method: Arlyss Gandy

## 2022-03-15 ENCOUNTER — Ambulatory Visit: Payer: Medicaid Other | Admitting: Family Medicine

## 2022-03-15 DIAGNOSIS — E559 Vitamin D deficiency, unspecified: Secondary | ICD-10-CM

## 2022-03-15 DIAGNOSIS — G894 Chronic pain syndrome: Secondary | ICD-10-CM

## 2022-03-15 DIAGNOSIS — D5700 Hb-SS disease with crisis, unspecified: Secondary | ICD-10-CM

## 2022-03-24 ENCOUNTER — Observation Stay (HOSPITAL_COMMUNITY): Payer: Medicaid Other

## 2022-03-24 ENCOUNTER — Inpatient Hospital Stay (HOSPITAL_COMMUNITY)
Admission: EM | Admit: 2022-03-24 | Discharge: 2022-03-28 | DRG: 812 | Disposition: A | Discharge status: Home / Self Care | Payer: Medicaid Other | Attending: Internal Medicine | Admitting: Internal Medicine

## 2022-03-24 ENCOUNTER — Other Ambulatory Visit: Payer: Self-pay

## 2022-03-24 ENCOUNTER — Encounter (HOSPITAL_COMMUNITY): Payer: Self-pay

## 2022-03-24 DIAGNOSIS — M545 Low back pain, unspecified: Secondary | ICD-10-CM | POA: Diagnosis present

## 2022-03-24 DIAGNOSIS — G894 Chronic pain syndrome: Secondary | ICD-10-CM | POA: Diagnosis present

## 2022-03-24 DIAGNOSIS — F411 Generalized anxiety disorder: Secondary | ICD-10-CM | POA: Diagnosis not present

## 2022-03-24 DIAGNOSIS — F17210 Nicotine dependence, cigarettes, uncomplicated: Secondary | ICD-10-CM | POA: Diagnosis present

## 2022-03-24 DIAGNOSIS — D5700 Hb-SS disease with crisis, unspecified: Secondary | ICD-10-CM | POA: Diagnosis not present

## 2022-03-24 DIAGNOSIS — D571 Sickle-cell disease without crisis: Secondary | ICD-10-CM | POA: Diagnosis present

## 2022-03-24 DIAGNOSIS — F17290 Nicotine dependence, other tobacco product, uncomplicated: Secondary | ICD-10-CM | POA: Diagnosis present

## 2022-03-24 DIAGNOSIS — F1120 Opioid dependence, uncomplicated: Secondary | ICD-10-CM | POA: Diagnosis present

## 2022-03-24 DIAGNOSIS — Z888 Allergy status to other drugs, medicaments and biological substances status: Secondary | ICD-10-CM

## 2022-03-24 LAB — CBC WITH DIFFERENTIAL/PLATELET
Abs Immature Granulocytes: 0.01 10*3/uL (ref 0.00–0.07)
Basophils Absolute: 0.1 10*3/uL (ref 0.0–0.1)
Basophils Relative: 1 %
Eosinophils Absolute: 0.2 10*3/uL (ref 0.0–0.5)
Eosinophils Relative: 3 %
HCT: 26.8 % — ABNORMAL LOW (ref 36.0–46.0)
Hemoglobin: 10 g/dL — ABNORMAL LOW (ref 12.0–15.0)
Immature Granulocytes: 0 %
Lymphocytes Relative: 39 %
Lymphs Abs: 3 10*3/uL (ref 0.7–4.0)
MCH: 31.3 pg (ref 26.0–34.0)
MCHC: 37.3 g/dL — ABNORMAL HIGH (ref 30.0–36.0)
MCV: 84 fL (ref 80.0–100.0)
Monocytes Absolute: 0.5 10*3/uL (ref 0.1–1.0)
Monocytes Relative: 7 %
Neutro Abs: 3.9 10*3/uL (ref 1.7–7.7)
Neutrophils Relative %: 50 %
Platelets: 408 10*3/uL — ABNORMAL HIGH (ref 150–400)
RBC: 3.19 MIL/uL — ABNORMAL LOW (ref 3.87–5.11)
RDW: 14.4 % (ref 11.5–15.5)
WBC: 7.7 10*3/uL (ref 4.0–10.5)
nRBC: 0.4 % — ABNORMAL HIGH (ref 0.0–0.2)

## 2022-03-24 LAB — BASIC METABOLIC PANEL
Anion gap: 5 (ref 5–15)
BUN: 10 mg/dL (ref 6–20)
CO2: 25 mmol/L (ref 22–32)
Calcium: 9.2 mg/dL (ref 8.9–10.3)
Chloride: 113 mmol/L — ABNORMAL HIGH (ref 98–111)
Creatinine, Ser: 0.92 mg/dL (ref 0.44–1.00)
GFR, Estimated: >60 mL/min (ref >60)
Glucose, Bld: 93 mg/dL (ref 70–99)
Potassium: 4 mmol/L (ref 3.5–5.1)
Sodium: 143 mmol/L (ref 135–145)

## 2022-03-24 LAB — RETICULOCYTES
Immature Retic Fract: 24.7 % — ABNORMAL HIGH (ref 2.3–15.9)
RBC.: 3.18 MIL/uL — ABNORMAL LOW (ref 3.87–5.11)
Retic Count, Absolute: 93.8 10*3/uL (ref 19.0–186.0)
Retic Ct Pct: 3 % (ref 0.4–3.1)

## 2022-03-24 MED ORDER — ONDANSETRON HCL 4 MG/2ML IJ SOLN: 4.0000 mg | Freq: Four times a day (QID) | INTRAMUSCULAR | Status: DC | PRN

## 2022-03-24 MED ORDER — SODIUM CHLORIDE 0.45 % IV SOLN: INTRAVENOUS | Status: AC

## 2022-03-24 MED ORDER — ALPRAZOLAM 0.25 MG PO TABS
0.2500 mg | ORAL_TABLET | Freq: Two times a day (BID) | ORAL | Status: DC | PRN
  Administered 2022-03-25 – 2022-03-27 (×5): 0.25 mg via ORAL
  Filled 2022-03-24 (×5): qty 1

## 2022-03-24 MED ORDER — DIPHENHYDRAMINE HCL 25 MG PO CAPS: 25.0000 mg | ORAL_CAPSULE | ORAL | Status: DC | PRN

## 2022-03-24 MED ORDER — ACETAMINOPHEN 325 MG PO TABS: 650.0000 mg | ORAL_TABLET | Freq: Four times a day (QID) | ORAL | Status: DC | PRN

## 2022-03-24 MED ORDER — HYDROMORPHONE HCL 2 MG/ML IJ SOLN
1.5000 mg | Freq: Once | INTRAMUSCULAR | Status: AC
  Administered 2022-03-24: 1.5 mg via INTRAVENOUS
  Filled 2022-03-24: qty 1

## 2022-03-24 MED ORDER — NALOXONE HCL 0.4 MG/ML IJ SOLN: 0.4000 mg | INTRAMUSCULAR | Status: DC | PRN

## 2022-03-24 MED ORDER — ENOXAPARIN SODIUM 40 MG/0.4ML IJ SOSY
40.0000 mg | PREFILLED_SYRINGE | INTRAMUSCULAR | Status: DC
  Administered 2022-03-25 – 2022-03-27 (×3): 40 mg via SUBCUTANEOUS
  Filled 2022-03-24 (×4): qty 0.4

## 2022-03-24 MED ORDER — DIPHENHYDRAMINE HCL 50 MG/ML IJ SOLN
25.0000 mg | Freq: Once | INTRAMUSCULAR | Status: AC
  Administered 2022-03-24: 25 mg via INTRAVENOUS
  Filled 2022-03-24: qty 1

## 2022-03-24 MED ORDER — ACETAMINOPHEN 650 MG RE SUPP: 650.0000 mg | Freq: Four times a day (QID) | RECTAL | Status: DC | PRN

## 2022-03-24 MED ORDER — HYDROMORPHONE 1 MG/ML IV SOLN: INTRAVENOUS | Status: DC

## 2022-03-24 MED ORDER — KETOROLAC TROMETHAMINE 15 MG/ML IJ SOLN
15.0000 mg | Freq: Four times a day (QID) | INTRAMUSCULAR | Status: AC | PRN
  Administered 2022-03-25 – 2022-03-26 (×5): 15 mg via INTRAVENOUS
  Filled 2022-03-24 (×5): qty 1

## 2022-03-24 MED ORDER — KETOROLAC TROMETHAMINE 30 MG/ML IJ SOLN
30.0000 mg | Freq: Once | INTRAMUSCULAR | Status: AC
  Administered 2022-03-24: 30 mg via INTRAVENOUS
  Filled 2022-03-24: qty 1

## 2022-03-24 MED ORDER — POLYETHYLENE GLYCOL 3350 17 G PO PACK: 17.0000 g | PACK | Freq: Every day | ORAL | Status: DC | PRN

## 2022-03-24 MED ORDER — MELATONIN 3 MG PO TABS: 3.0000 mg | ORAL_TABLET | Freq: Every evening | ORAL | Status: DC | PRN

## 2022-03-24 MED ORDER — SODIUM CHLORIDE 0.9% FLUSH: 9.0000 mL | INTRAVENOUS | Status: DC | PRN

## 2022-03-24 MED ORDER — DULOXETINE HCL 20 MG PO CPEP
20.0000 mg | ORAL_CAPSULE | Freq: Every day | ORAL | Status: DC
  Administered 2022-03-25 – 2022-03-28 (×4): 20 mg via ORAL
  Filled 2022-03-24 (×4): qty 1

## 2022-03-24 MED ORDER — ONDANSETRON HCL 4 MG/2ML IJ SOLN
4.0000 mg | Freq: Once | INTRAMUSCULAR | Status: AC
  Administered 2022-03-24: 4 mg via INTRAVENOUS
  Filled 2022-03-24: qty 2

## 2022-03-24 MED ORDER — SENNOSIDES-DOCUSATE SODIUM 8.6-50 MG PO TABS
1.0000 | ORAL_TABLET | Freq: Two times a day (BID) | ORAL | Status: DC
  Administered 2022-03-25 – 2022-03-26 (×4): 1 via ORAL
  Filled 2022-03-24 (×7): qty 1

## 2022-03-24 MED ORDER — HYDROMORPHONE 1 MG/ML IV SOLN
INTRAVENOUS | Status: DC
  Administered 2022-03-24: 30 mg via INTRAVENOUS
  Administered 2022-03-25: 6.5 mg via INTRAVENOUS
  Administered 2022-03-25: 4 mg via INTRAVENOUS
  Administered 2022-03-25: 4.5 mg via INTRAVENOUS
  Administered 2022-03-25: 30 mg via INTRAVENOUS
  Administered 2022-03-25: 7 mg via INTRAVENOUS
  Administered 2022-03-26: 2 mg via INTRAVENOUS
  Administered 2022-03-26: 4.5 mg via INTRAVENOUS
  Administered 2022-03-26 (×2): 4 mg via INTRAVENOUS
  Administered 2022-03-26: 1.5 mg via INTRAVENOUS
  Administered 2022-03-26: 6 mg via INTRAVENOUS
  Administered 2022-03-27: 3 mg via INTRAVENOUS
  Administered 2022-03-27: 2.5 mg via INTRAVENOUS
  Administered 2022-03-27: 2 mg via INTRAVENOUS
  Administered 2022-03-27: 30 mg via INTRAVENOUS
  Administered 2022-03-27: 1.5 mg via INTRAVENOUS
  Administered 2022-03-27: 2 mg via INTRAVENOUS
  Administered 2022-03-27: 1.5 mg via INTRAVENOUS
  Administered 2022-03-27: 5.5 mg via INTRAVENOUS
  Administered 2022-03-28 (×2): 1 mg via INTRAVENOUS
  Filled 2022-03-24 (×3): qty 30

## 2022-03-24 MED ORDER — HYDROMORPHONE HCL 1 MG/ML IJ SOLN
1.0000 mg | INTRAMUSCULAR | Status: DC | PRN
  Administered 2022-03-24: 1 mg via INTRAVENOUS
  Filled 2022-03-24: qty 1

## 2022-03-24 MED ORDER — KETOROLAC TROMETHAMINE 15 MG/ML IJ SOLN: 15.0000 mg | Freq: Four times a day (QID) | INTRAMUSCULAR | Status: DC

## 2022-03-24 MED ORDER — HYDROMORPHONE HCL 2 MG/ML IJ SOLN
2.0000 mg | Freq: Once | INTRAMUSCULAR | Status: AC
  Administered 2022-03-24: 2 mg via INTRAVENOUS
  Filled 2022-03-24: qty 1

## 2022-03-24 MED ORDER — SODIUM CHLORIDE 0.9 % IV BOLUS
1000.0000 mL | Freq: Once | INTRAVENOUS | Status: AC
  Administered 2022-03-24: 1000 mL via INTRAVENOUS

## 2022-03-24 NOTE — ED Provider Notes (Signed)
[facility]EM[facility]GENCY [facility]PT Provider Note   CSN: [id number] Arrival date & time: 03/24/22  1811     History  Chief Complaint  Patient presents with   Sickle Cell Pain Crisis    Lindsey [name] is a 51 y.o. female present emerged department with complaint of sickle cell pain crisis.  This began yesterday.  Pain in her back and her right leg which is typical for crisis.  HPI     Home Medications Prior to Admission medications   Medication Sig Start Date End Date Taking? Authorizing Provider  ALPRAZolam (XANAX) 0.25 MG tablet Take 1 tablet (0.25 mg total) by mouth 2 (two) times daily as needed for anxiety. 12/31/21   Rometta Emery, MD  diphenhydrAMINE (BENADRYL ALLERGY) 25 MG tablet Take 50 mg by mouth at bedtime as needed for sleep.    [provider]  DULoxetine (CYMBALTA) 20 MG capsule Take 1 capsule (20 mg total) by mouth daily. 12/31/21 12/31/22  Rometta Emery, MD  folic acid (FOLVITE) 1 MG tablet Take 1 tablet (1 mg total) by mouth daily. 12/31/21 12/31/22  Rometta Emery, MD  gabapentin (NEURONTIN) 400 MG capsule Take 1 capsule (400 mg total) by mouth 3 (three) times daily. 01/28/22   Massie Maroon, FNP  ibuprofen (ADVIL) 200 MG tablet Take 400 mg by mouth every 6 (six) hours as needed for mild pain.    [provider]  oxyCODONE (OXYCONTIN) 10 mg 12 hr tablet Take 1 tablet (10 mg total) by mouth every 12 (twelve) hours. 03/06/22   Massie Maroon, FNP  Oxycodone HCl 10 MG TABS Take 1 tablet (10 mg total) by mouth every 6 (six) hours as needed for up to 15 days (for moderate to severe pain). 03/15/22 03/30/22  Massie Maroon, FNP      Allergies    Ketamine    Review of Systems   Review of Systems  Physical Exam Updated Vital Signs BP 119/80   Pulse 77   Temp 98.2 F (36.8 C) (Oral)   Resp 14   Ht 5' (1.524 m)   Wt 63 kg   LMP 07/09/2021 (Approximate)   SpO2 97%   BMI 27.13 kg/m  Physical  Exam Constitutional:      General: She is not in acute distress.    Comments: Patient is crying, tearful  HENT:     Head: Normocephalic and atraumatic.  Eyes:     Conjunctiva/sclera: Conjunctivae normal.     Pupils: Pupils are equal, round, and reactive to light.  Cardiovascular:     Rate and Rhythm: Regular rhythm. Tachycardia present.  Pulmonary:     Effort: Pulmonary effort is normal. No respiratory distress.  Abdominal:     General: There is no distension.     Tenderness: There is no abdominal tenderness.  Skin:    General: Skin is warm and dry.  Neurological:     General: No focal deficit present.     Mental Status: She is alert. Mental status is at baseline.  Psychiatric:        Mood and Affect: Mood normal.        Behavior: Behavior normal.     ED Results / Procedures / Treatments   Labs (all labs ordered are listed, but only abnormal results are displayed) Labs Reviewed  RETICULOCYTES - Abnormal; Notable for the following components:      Result Value   RBC. 3.18 (*)    Immature Retic Fract 24.7 (*)  All other components within normal limits  CBC WITH DIFFERENTIAL/PLATELET - Abnormal; Notable for the following components:   RBC 3.19 (*)    Hemoglobin 10.0 (*)    HCT 26.8 (*)    MCHC 37.3 (*)    Platelets 408 (*)    nRBC 0.4 (*)    All other components within normal limits  BASIC METABOLIC PANEL - Abnormal; Notable for the following components:   Chloride 113 (*)    All other components within normal limits    EKG None  Radiology No results found.  Procedures .Critical Care  Performed by: Terald Sleeper, MD Authorized by: Terald Sleeper, MD   Critical care provider statement:    Critical care time (minutes):  30   Critical care time was exclusive of:  Separately billable procedures and treating other patients   Critical care was necessary to treat or prevent imminent or life-threatening deterioration of the following conditions:   Circulatory failure   Critical care was time spent personally by me on the following activities:  Ordering and performing treatments and interventions, ordering and review of laboratory studies, ordering and review of radiographic studies, pulse oximetry, review of old charts, examination of patient and evaluation of patient's response to treatment   Care discussed with: admitting provider   Comments:     Multiple rounds of IV medications required for pain control for sickle cell pain crisis     Medications Ordered in ED Medications  HYDROmorphone (DILAUDID) injection 2 mg (2 mg Intravenous Given 03/24/22 1937)  ondansetron (ZOFRAN) injection 4 mg (4 mg Intravenous Given 03/24/22 1939)  ketorolac (TORADOL) 30 MG/ML injection 30 mg (30 mg Intravenous Given 03/24/22 1938)  sodium chloride 0.9 % bolus 1,000 mL (1,000 mLs Intravenous New Bag/Given 03/24/22 1937)  diphenhydrAMINE (BENADRYL) injection 25 mg (25 mg Intravenous Given 03/24/22 2041)  HYDROmorphone (DILAUDID) injection 1.5 mg (1.5 mg Intravenous Given 03/24/22 2041)  HYDROmorphone (DILAUDID) injection 1.5 mg (1.5 mg Intravenous Given 03/24/22 2221)    ED Course/ Medical Decision Making/ A&P Clinical Course as of 03/24/22 2230  Thu Mar 24, 2022  2229 Patient reassessed still complaining of intractable pain.  She is going through 3 rounds of IV pain medications.  At this point she will be admitted for sickle cell pain crisis. [MT]    Clinical Course User Index [MT] Ethan Clayburn, Kermit Balo, MD                           Medical Decision Making Amount and/or Complexity of Data Reviewed Labs: ordered.  Risk Prescription drug management. Decision regarding hospitalization.   Patient is here with suspected sickle cell pain crisis.  It is not a typical location.  She does not have signs or symptoms of acute chest or other medical emergency.  I personally reviewed interpreted her labs, showing no leukocytosis, no significant acute anemia,  no significant reticulocyte elevation.  BMP unremarkable.  I reviewed external records, she was most recently hospitalized proxy 1 month ago in October for sickle cell pain crisis.  Her husband is present at the bedside to provide supplemental history.        Final Clinical Impression(s) / ED Diagnoses Final diagnoses:  Sickle cell pain crisis [facility])    Rx / DC Orders ED Discharge Orders     None         Terald Sleeper, MD 03/24/22 2230

## 2022-03-24 NOTE — ED Triage Notes (Signed)
C/o SCC with back pain and right leg pain since yesterday

## 2022-03-24 NOTE — H&P (Signed)
History and Physical      Monty Mccarrell TDV:[id number] DOB: 07-18-1970 DOA: 03/24/2022  PCP: Massie Maroon, FNP  Patient coming from: home   I have personally briefly reviewed patient's old medical records in [facility]  Chief Complaint: low back pain   HPI: Lindsey [name] is a 51 y.o. 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   In this patient with a documented history of sickle cell disease, the patient reports 1 day of progressive sharp bilateral paraspinal low back discomfort as well as sharp discomfort into the anterior aspects of the proximal aspects of the bilateral lower extremities, in distribution, quality, and intensity that is consistent with pain experienced at times of her prior acute sickle cell pain crisis.  She notes poor pain control in spite of home analgesic regimen, consisting of long-acting OxyContin as well as prn oxycodone, prompting her to present to [facility] emergency department today for further evaluation and management thereof.  Denies any associated chest pain, shortness of breath, palpitations, dizziness, nausea, vomiting, diarrhea, dizziness, presyncope, or syncope.  She also denies any recent subjective fever, chills, rigors, or generalized myalgias.  No recent headache, neck stiffness, rash, cough, abdominal pain, dysuria, or gross hematuria.  She notes multiple prior hospitalizations for acute sickle cell pain crisis, with most recent prior hospitalization for such occurring in October 2023, specifically 02/11/2022 to 02/14/22.   Per chart review, baseline hemoglobin range appears to be 8.5-10.5, with most recent prior hemoglobin data point noted to be 8.9 on  02/12/2022.  Additionally, per chart review, most recent reticulocyte count percentages noted to be 23.7 on the day of most recent prior admission on 02/11/22, preceded by 17.3% on 01/28/2022.  Denies any routine or recent alcohol consumption, and denies any history of recreational drug use.      ED Course:  Vital signs in the ED were notable for the following: Temperature max 99.3; initial heart rate 102, subsequent improving into the 60s to 80s following IV fluids as well as multiple doses of IV pain medication, as further detailed below; systolic blood pressures in the low 100s to 120s; respiratory rate 14-23, oxygen saturation 94 to 100% on room air.  Labs were notable for the following: BMP notable for the following: Creatinine 0.92 compared to most recent prior serum creatinine data point 0.81 on 02/11/2022.  CBC notable for white blood cell count 7700 with 50% neutrophils, hemoglobin 10.0, platelet count 408.  Reticulocyte percentage 24.7%.  While in the ED, the following were administered: Dilaudid 1.5 mg IV x2 doses, Dilaudid 2 mg IV x1 dose, Toradol 30 mg IV x1 dose, Benadryl 25 mg IV x1, Zofran 4 mg IV x1, and a 1 L normal saline bolus.  In the setting of poorly controlled pain following at least 3 doses of IV analgesics in the ED, the patient is subsequently being admitted for further evaluation/management of presenting acute sickle cell pain crisis, including an emphasis on pain control.     Review of Systems: As per HPI otherwise 10 point review of systems negative.   Past Medical History:  Diagnosis Date   Acute kidney injury (HCC) 01/18/2021   Acute pain of left shoulder    Cough 06/27/2020   Hypokalemia 06/27/2020  Hypoxia    Leukocytosis 09/03/2016   Opiate abuse, episodic (HCC) 09/25/2017   Opioid dependence in remission (HCC)    Right leg pain    Sickle cell crisis (HCC)    Vasoocclusive sickle cell crisis (HCC) 07/17/2021    Past Surgical History:  Procedure  Laterality Date   APPENDECTOMY     CESAREAN SECTION     OTHER SURGICAL HISTORY     c-section    Social History:  reports that she has been smoking cigars and cigarettes. She has never used smokeless tobacco. She reports current alcohol use. She reports that she does not use drugs.   Allergies  Allergen Reactions   Ketamine Anxiety and Other (See Comments)    Tachycardia    Family History  Problem Relation Age of Onset   Stroke Neg Hx        none that she knows of    Seizures Neg Hx     Family history reviewed and not pertinent    Prior to Admission medications   Medication Sig Start Date End Date Taking? Authorizing Provider  ALPRAZolam (XANAX) 0.25 MG tablet Take 1 tablet (0.25 mg total) by mouth 2 (two) times daily as needed for anxiety. 12/31/21   Rometta Emery, MD  diphenhydrAMINE (BENADRYL ALLERGY) 25 MG tablet Take 50 mg by mouth at bedtime as needed for sleep.    [provider]  DULoxetine (CYMBALTA) 20 MG capsule Take 1 capsule (20 mg total) by mouth daily. 12/31/21 12/31/22  Rometta Emery, MD  folic acid (FOLVITE) 1 MG tablet Take 1 tablet (1 mg total) by mouth daily. 12/31/21 12/31/22  Rometta Emery, MD  gabapentin (NEURONTIN) 400 MG capsule Take 1 capsule (400 mg total) by mouth 3 (three) times daily. 01/28/22   Massie Maroon, FNP  ibuprofen (ADVIL) 200 MG tablet Take 400 mg by mouth every 6 (six) hours as needed for mild pain.    [provider]  oxyCODONE (OXYCONTIN) 10 mg 12 hr tablet Take 1 tablet (10 mg total) by mouth every 12 (twelve) hours. 03/06/22   Massie Maroon, FNP  Oxycodone HCl 10 MG TABS Take 1 tablet (10 mg total) by mouth every 6 (six) hours as needed for up to 15 days (for moderate to severe pain). 03/15/22 03/30/22  Massie Maroon, FNP     Objective    Physical Exam: Vitals:   03/24/22 2115 03/24/22 2200 03/24/22 2221 03/24/22 2230  BP: 115/71 119/80  (!) 105/21  Pulse: 86 77  79  Resp: 14 14  14   Temp:    98.2 F (36.8 C)   TempSrc:   Oral   SpO2: 100% 97%  98%  Weight:      Height:        General: appears to be stated age; alert, oriented Skin: warm, dry, no rash Head:  AT/[place] Mouth:  Oral mucosa membranes appear dry, normal dentition Neck: supple; trachea midline Heart:  RRR; did not appreciate any M/R/G Lungs: CTAB, did not appreciate any wheezes, rales, or rhonchi Abdomen: + BS; soft, ND, NT Vascular: 2+ pedal pulses b/l; 2+ radial pulses b/l Extremities: no peripheral edema, no muscle wasting Neuro: strength and sensation intact in upper and lower extremities b/l    Labs on Admission: I have personally reviewed following labs and imaging studies  CBC: Recent Labs  Lab 03/24/22 1901  WBC 7.7  NEUTROABS 3.9  HGB 10.0*  HCT 26.8*  MCV 84.0  PLT 408*  Basic Metabolic Panel: Recent Labs  Lab 03/24/22 1901  NA 143  K 4.0  CL 113*  CO2 25  GLUCOSE 93  BUN 10  CREATININE 0.92  CALCIUM 9.2   GFR: Estimated Creatinine Clearance: 60.6 mL/min (by C-G formula based on SCr of 0.92 mg/dL). Liver Function Tests: No results for input(s): "AST", "ALT", "ALKPHOS", "BILITOT", "PROT", "ALBUMIN" in the last 168 hours. No results for input(s): "LIPASE", "AMYLASE" in the last 168 hours. No results for input(s): "AMMONIA" in the last 168 hours. Coagulation Profile: No results for input(s): "INR", "PROTIME" in the last 168 hours. Cardiac Enzymes: No results for input(s): "CKTOTAL", "CKMB", "CKMBINDEX", "TROPONINI" in the last 168 hours. BNP (last 3 results) No results for input(s): "PROBNP" in the last 8760 hours. HbA1C: No results for input(s): "HGBA1C" in the last 72 hours. CBG: No results for input(s): "GLUCAP" in the last 168 hours. Lipid Profile: No results for input(s): "CHOL", "HDL", "LDLCALC", "TRIG", "CHOLHDL", "LDLDIRECT" in the last 72 hours. Thyroid Function Tests: No results for input(s): "TSH", "T4TOTAL", "FREET4", "T3FREE", "THYROIDAB" in the last 72  hours. Anemia Panel: Recent Labs    03/24/22 1901  RETICCTPCT 3.0   Urine analysis:    Component Value Date/Time   COLORURINE YELLOW 05/31/2021 1350   APPEARANCEUR TURBID (A) 05/31/2021 1350   LABSPEC 1.012 05/31/2021 1350   PHURINE 5.0 05/31/2021 1350   GLUCOSEU NEGATIVE 05/31/2021 1350   HGBUR SMALL (A) 05/31/2021 1350   BILIRUBINUR NEGATIVE 05/31/2021 1350   BILIRUBINUR neg 04/07/2020 1014   KETONESUR NEGATIVE 05/31/2021 1350   PROTEINUR 100 (A) 05/31/2021 1350   UROBILINOGEN 0.2 04/07/2020 1014   UROBILINOGEN 1.0 12/25/2012 1755   NITRITE NEGATIVE 05/31/2021 1350   LEUKOCYTESUR MODERATE (A) 05/31/2021 1350    Radiological Exams on Admission: No results found.    Assessment/Plan    Principal Problem:   Sickle cell pain crisis (HCC) Active Problems:   Sickle cell disease (HCC)   Acute low back pain   GAD (generalized anxiety disorder)     #) Acute Sickle Cell Pain Crisis: In the setting of a known h/o sickle cell disease w/ multiple previous episodes of acute sickle cell pain crises requiring hospitalizaiton, the patient presents with sharp discomfort in the low back as well as the bilateral lower extremities of distribution, quality, and intensity consistent with that experienced at times of previous sickle cell pain crises, and poorly controlled via home analgesic regimen. Relative to baseline Hgb range of 8.5-2.5, presenting labs notable for Hgb  of 10.0 and increased reticulocyte count percent of his elevated relative to most recent prior corresponding values drawn at time of most recent 2 prior hospitalizations, as quantified above. Pain remains poorly controlled after 4 doses of IV analgesics in the ED today. In this setting, will admit for further optimization of pain control, and will plan to aggressively treat pain, as further described below, w/ close monitoring for development of respiratory depression. No evidence of chest pain/acute Chest Syndrome at this  time.  However, will pursue EKG at this time to establish baseline, in case the patient would incessantly develop chest discomfort.  In terms of potential exacerbating factors contributing to presenting SS pain crisis, no overt e/o underlying infection at this time, but will expand infectious work-up to include chest x-ray, urinalysis.  Differential also includes the possibility of cold weather induced reflects vasospasm, as well as potential contribution from mild dehydration given improvement in heart rate following IV fluids administered in the ED.  Per my  discussions with the patient's evening, she is amenable to initiation of Dilaudid PCA upon arriving on the medical floor. Consequently, and in the setting of opioid resistance, will initiate Dilaudid PCA for acute sickle cell pain crisis with specific settings within the following parameters: 0.01 - 0.02 mg/kg with 10 minute lockout interval and 1 hour limit of 0.06 mg/kg, as further quantified below.  Patient's most recent recorded body weight noted to be 63 kg.  No indication for exchange transfusion at this time. '  Plan: Repeat reticulocyte count and CBC in AM.  Type/screen. Monitor on telemetry. Sickle cell pain assessment per protocol. Aggressive opioid analgesia in the form of Dilaudid PCA, with the following initial settings: bolus dose of 0.6 mg with 10 minute lockout interval and 1 hour limit of 4.0 mg. close monitoring for development of respiratory depression. Prn narcan. Monitor continuous pulse oximetry. Prn supplemental O2 in order to maintain O2 sats between 90-92%, with care to not over-oxygenate as this can suppress bone marrow production of rbc's.  Monitor strict I's and O's.  Close monitoring of renal function, including repeat CMP in the AM. incentive spirometry to decrease risk of development of atelectasis. Prn Benadryl for pruritus. Prn IV Toradol.  Gentle IVF's via 1/2NS at 75 cc/h x 8 hours with caution to not induce volume  overload.  Check EKG, urinalysis, chest x-ray.  Type and screen ordered.  Scheduled Senokot.  Prn MiraLAX.           #) Generalized anxiety disorder: Documented history of such, on scheduled Cymbalta as well as prn Xanax as an outpatient.  Plan: will resume home Cymbalta and prn Xanax.  We will follow for result of EKG, including evaluation of QTc interval.      DVT prophylaxis: Lovenox 40 mg subcu daily Code Status: Full code Family Communication: none Disposition Plan: Per Rounding Team Consults called: none;  Admission status: Observation     Angie FavaJustin B Jeaninne Lodico DO [facility] From 7PM - 7AM   03/24/2022, 11:01 PM

## 2022-03-25 DIAGNOSIS — F17290 Nicotine dependence, other tobacco product, uncomplicated: Secondary | ICD-10-CM | POA: Diagnosis present

## 2022-03-25 DIAGNOSIS — D5700 Hb-SS disease with crisis, unspecified: Principal | ICD-10-CM

## 2022-03-25 DIAGNOSIS — F411 Generalized anxiety disorder: Secondary | ICD-10-CM | POA: Diagnosis present

## 2022-03-25 DIAGNOSIS — F1120 Opioid dependence, uncomplicated: Secondary | ICD-10-CM | POA: Diagnosis present

## 2022-03-25 DIAGNOSIS — Z888 Allergy status to other drugs, medicaments and biological substances status: Secondary | ICD-10-CM | POA: Diagnosis not present

## 2022-03-25 DIAGNOSIS — G894 Chronic pain syndrome: Secondary | ICD-10-CM | POA: Diagnosis present

## 2022-03-25 DIAGNOSIS — F17210 Nicotine dependence, cigarettes, uncomplicated: Secondary | ICD-10-CM | POA: Diagnosis present

## 2022-03-25 DIAGNOSIS — M545 Low back pain, unspecified: Secondary | ICD-10-CM | POA: Diagnosis present

## 2022-03-25 LAB — COMPREHENSIVE METABOLIC PANEL
ALT: 12 U/L (ref 0–44)
AST: 16 U/L (ref 15–41)
Albumin: 3.8 g/dL (ref 3.5–5.0)
Alkaline Phosphatase: 36 U/L — ABNORMAL LOW (ref 38–126)
Anion gap: 4 — ABNORMAL LOW (ref 5–15)
BUN: 11 mg/dL (ref 6–20)
CO2: 25 mmol/L (ref 22–32)
Calcium: 8.7 mg/dL — ABNORMAL LOW (ref 8.9–10.3)
Chloride: 113 mmol/L — ABNORMAL HIGH (ref 98–111)
Creatinine, Ser: 0.86 mg/dL (ref 0.44–1.00)
GFR, Estimated: >60 mL/min (ref >60)
Glucose, Bld: 104 mg/dL — ABNORMAL HIGH (ref 70–99)
Potassium: 3.9 mmol/L (ref 3.5–5.1)
Sodium: 142 mmol/L (ref 135–145)
Total Bilirubin: 1.1 mg/dL (ref 0.3–1.2)
Total Protein: 6.6 g/dL (ref 6.5–8.1)

## 2022-03-25 LAB — CBC WITH DIFFERENTIAL/PLATELET
Abs Immature Granulocytes: 0.01 10*3/uL (ref 0.00–0.07)
Basophils Absolute: 0.1 10*3/uL (ref 0.0–0.1)
Basophils Relative: 1 %
Eosinophils Absolute: 0.3 10*3/uL (ref 0.0–0.5)
Eosinophils Relative: 3 %
HCT: 23.9 % — ABNORMAL LOW (ref 36.0–46.0)
Hemoglobin: 8.8 g/dL — ABNORMAL LOW (ref 12.0–15.0)
Immature Granulocytes: 0 %
Lymphocytes Relative: 51 %
Lymphs Abs: 4.6 10*3/uL — ABNORMAL HIGH (ref 0.7–4.0)
MCH: 31.4 pg (ref 26.0–34.0)
MCHC: 36.8 g/dL — ABNORMAL HIGH (ref 30.0–36.0)
MCV: 85.4 fL (ref 80.0–100.0)
Monocytes Absolute: 0.6 10*3/uL (ref 0.1–1.0)
Monocytes Relative: 7 %
Neutro Abs: 3.3 10*3/uL (ref 1.7–7.7)
Neutrophils Relative %: 38 %
Platelets: 365 10*3/uL (ref 150–400)
RBC: 2.8 MIL/uL — ABNORMAL LOW (ref 3.87–5.11)
RDW: 14.4 % (ref 11.5–15.5)
WBC: 8.8 10*3/uL (ref 4.0–10.5)
nRBC: 0.3 % — ABNORMAL HIGH (ref 0.0–0.2)

## 2022-03-25 LAB — RETICULOCYTES
Immature Retic Fract: 25.7 % — ABNORMAL HIGH (ref 2.3–15.9)
RBC.: 2.79 MIL/uL — ABNORMAL LOW (ref 3.87–5.11)
Retic Count, Absolute: 88.2 10*3/uL (ref 19.0–186.0)
Retic Ct Pct: 3.2 % — ABNORMAL HIGH (ref 0.4–3.1)

## 2022-03-25 LAB — TYPE AND SCREEN
ABO/RH(D): A POS
Antibody Screen: NEGATIVE

## 2022-03-25 LAB — MAGNESIUM: Magnesium: 2.1 mg/dL (ref 1.7–2.4)

## 2022-03-25 MED ORDER — LORAZEPAM 2 MG/ML IJ SOLN
0.5000 mg | Freq: Once | INTRAMUSCULAR | Status: AC
  Administered 2022-03-25: 0.5 mg via INTRAVENOUS
  Filled 2022-03-25: qty 1

## 2022-03-25 MED ORDER — OXYCODONE HCL ER 10 MG PO T12A
10.0000 mg | EXTENDED_RELEASE_TABLET | Freq: Two times a day (BID) | ORAL | Status: DC
  Administered 2022-03-25 – 2022-03-28 (×7): 10 mg via ORAL
  Filled 2022-03-25 (×7): qty 1

## 2022-03-25 MED ORDER — INFLUENZA VAC SPLIT QUAD 0.5 ML IM SUSY: 0.5000 mL | PREFILLED_SYRINGE | INTRAMUSCULAR | Status: DC | PRN

## 2022-03-25 MED ORDER — OXYCODONE HCL 5 MG PO TABS
5.0000 mg | ORAL_TABLET | Freq: Once | ORAL | Status: AC
  Administered 2022-03-25: 5 mg via ORAL
  Filled 2022-03-25: qty 1

## 2022-03-25 MED ORDER — ALPRAZOLAM 0.25 MG PO TABS
0.2500 mg | ORAL_TABLET | Freq: Once | ORAL | Status: AC
  Administered 2022-03-25: 0.25 mg via ORAL
  Filled 2022-03-25: qty 1

## 2022-03-25 MED ORDER — PNEUMOCOCCAL 20-VAL CONJ VACC 0.5 ML IM SUSY: 0.5000 mL | PREFILLED_SYRINGE | INTRAMUSCULAR | Status: DC | PRN

## 2022-03-25 MED ORDER — SODIUM CHLORIDE 0.45 % IV SOLN: INTRAVENOUS | Status: AC

## 2022-03-25 NOTE — TOC Progression Note (Signed)
Transition of Care [facility]) - Progression Note    Patient Details  Name: Davonne Baby MRN: [id number] Date of Birth: 06-02-1970  Transition of Care [facility]) CM/SW Contact  Beckie Busing, RN Phone Number:[phone]  03/25/2022, 8:32 AM  Clinical Narrative:     Transition of Care [facility]) Screening Note   Patient Details  Name: Kerrigan Gombos Date of Birth: [birth year]   Transition of Care [facility]) CM/SW Contact:    Beckie Busing, RN Phone Number: 03/25/2022, 8:33 AM    Transition of Care Department [facility]) has reviewed patient and no TOC needs have been identified at this time. We will continue to monitor patient advancement through interdisciplinary progression rounds. If new patient transition needs arise, please place a TOC consult.          Expected Discharge Plan and Services                                                 Social Determinants of Health (SDOH) Interventions    Readmission Risk Interventions    02/14/2022   10:02 AM 11/30/2021   10:59 AM 10/29/2021    1:04 PM  Readmission Risk Prevention Plan  Transportation Screening Complete Complete Complete  PCP or Specialist Appt within 3-5 Days   Complete  HRI or Home Care Consult   Complete  Social Work Consult for Recovery Care Planning/Counseling   Complete  Palliative Care Screening   Not Applicable  Medication Review [facility]) Complete Complete Complete  PCP or Specialist appointment within 3-5 days of discharge Complete Complete   HRI or Home Care Consult Complete Complete   SW Recovery Care/Counseling Consult Complete Complete   Palliative Care Screening Not Applicable Not Applicable   Skilled Nursing Facility Not Applicable Not Applicable

## 2022-03-25 NOTE — Progress Notes (Signed)
Subjective: Lindsey [name] is a 51 year old female with a medical history significant for sickle cell disease, chronic pain syndrome, opiate dependence and tolerance, and anemia of chronic disease was admitted for sickle cell pain crisis. Today, patient states that she has been unable to get any pain relief.  She is very tearful and writhing in pain.  Pain is primarily to low back and lower extremities.  She denies any headache, chest pain, urinary symptoms, nausea, vomiting, or diarrhea.  Objective:  Vital signs in last 24 hours:  Vitals:   03/25/22 0418 03/25/22 0633 03/25/22 0743 03/25/22 0840  BP:  103/78  (!) 98/58  Pulse:  73  62  Resp: 14 16 13 20   Temp:  98.8 F (37.1 C)  98.1 F (36.7 C)  TempSrc:  Oral  Oral  SpO2: 98% 98% 100% 98%  Weight:      Height:        Intake/Output from previous day:   Intake/Output Summary (Last 24 hours) at 03/25/2022 1022 Last data filed at 03/25/2022 0517 Gross per 24 hour  Intake 437.75 ml  Output --  Net 437.75 ml    Physical Exam: General: Alert, awake, oriented x3, in no acute distress.  HEENT: [place]/AT PEERL, EOMI Neck: Trachea midline,  no masses, no thyromegal,y no JVD, no carotid bruit OROPHARYNX:  Moist, No exudate/ erythema/lesions.  Heart: Regular rate and rhythm, without murmurs, rubs, gallops, PMI non-displaced, no heaves or thrills on palpation.  Lungs: Clear to auscultation, no wheezing or rhonchi noted. No increased vocal fremitus resonant to percussion  Abdomen: Soft, nontender, nondistended, positive bowel sounds, no masses no hepatosplenomegaly noted..  Neuro: No focal neurological deficits noted cranial nerves II through XII grossly intact. DTRs 2+ bilaterally upper and lower extremities. Strength 5 out of 5 in bilateral upper and lower extremities. Musculoskeletal: No warm swelling or erythema around joints, no spinal tenderness noted. Psychiatric: Patient alert and oriented x3, good insight and cognition, good  recent to remote recall. Lymph node survey: No cervical axillary or inguinal lymphadenopathy noted.  Lab Results:  Basic Metabolic Panel:    Component Value Date/Time   NA 142 03/25/2022 0122   NA 142 05/20/2021 1101   K 3.9 03/25/2022 0122   CL 113 (H) 03/25/2022 0122   CO2 25 03/25/2022 0122   BUN 11 03/25/2022 0122   BUN 10 05/20/2021 1101   CREATININE 0.86 03/25/2022 0122   GLUCOSE 104 (H) 03/25/2022 0122   CALCIUM 8.7 (L) 03/25/2022 0122   CBC:    Component Value Date/Time   WBC 8.8 03/25/2022 0122   HGB 8.8 (L) 03/25/2022 0122   HGB 11.0 (L) 05/20/2021 1101   HCT 23.9 (L) 03/25/2022 0122   HCT 31.3 (L) 05/20/2021 1101   PLT 365 03/25/2022 0122   PLT 509 (H) 05/20/2021 1101   MCV 85.4 03/25/2022 0122   MCV 88 05/20/2021 1101   NEUTROABS 3.3 03/25/2022 0122   NEUTROABS 5.4 05/20/2021 1101   LYMPHSABS 4.6 (H) 03/25/2022 0122   LYMPHSABS 2.5 05/20/2021 1101   MONOABS 0.6 03/25/2022 0122   EOSABS 0.3 03/25/2022 0122   EOSABS 0.3 05/20/2021 1101   BASOSABS 0.1 03/25/2022 0122   BASOSABS 0.1 05/20/2021 1101    No results found for this or any previous visit (from the past 240 hour(s)).  Studies/Results: DG Chest Port 1 View  Result Date: 03/25/2022 CLINICAL DATA:  03/27/2022 Sickle cell pain crisis (HCC) [id number] EXAM: PORTABLE CHEST 1 VIEW COMPARISON:  None Available. FINDINGS: Lungs are well  expanded, symmetric, and clear. No pneumothorax or pleural effusion. Cardiac size within normal limits. Pulmonary vascularity is normal. Sclerotic changes within the humeral heads bilaterally likely relate to avascular necrosis. No acute bone abnormality. IMPRESSION: 1. No active disease. 2. Bilateral humeral head avascular necrosis. Electronically Signed   By: Helyn Numbers M.D.   On: 03/25/2022 01:23    Medications: Scheduled Meds:  DULoxetine  20 mg Oral Daily   enoxaparin (LOVENOX) injection  40 mg Subcutaneous Q24H   HYDROmorphone   Intravenous Q4H   oxyCODONE  10 mg Oral  Q12H   senna-docusate  1 tablet Oral BID   Continuous Infusions: PRN Meds:.acetaminophen **OR** acetaminophen, ALPRAZolam, diphenhydrAMINE, influenza vac split quadrivalent PF, ketorolac, melatonin, naloxone **AND** sodium chloride flush, ondansetron (ZOFRAN) IV, pneumococcal 20-valent conjugate vaccine, polyethylene glycol  Consultants: none  Procedures: none  Antibiotics: none  Assessment/Plan: Principal Problem:   Sickle cell pain crisis (HCC) Active Problems:   Sickle cell disease (HCC)   Acute low back pain   GAD (generalized anxiety disorder)  Sickle cell disease with pain crisis: Continue IV Dilaudid PCA without any changes Initiate OxyContin 10 mg every 12 hours Oxycodone 10 mg every 6 hours as needed for severe breakthrough pain Toradol 15 mg IV every 6 hours for total of 5 days Monitor vital signs very closely, reevaluate pain scale regularly, and supplemental oxygen as needed  Chronic pain syndrome: Continue home medications  Generalized anxiety disorder: Continue home medications.  No suicidal homicidal intentions on today.  Continue to follow closely.  Anemia of chronic disease: Today patient's hemoglobin is 8.8 g/dL.  No clinical indication for blood transfusion at this time.  Continue to follow closely.  Labs in AM.  Code Status: Full Code Family Communication: N/A Disposition Plan: Not yet ready for discharge  [name]  APRN, MSN, FNP-C [facility] [address] [place], [place] [address] [phone]  If 7PM-7AM, please contact night-coverage.  03/25/2022, 10:22 AM  LOS: 0 days

## 2022-03-26 DIAGNOSIS — D5700 Hb-SS disease with crisis, unspecified: Secondary | ICD-10-CM | POA: Diagnosis not present

## 2022-03-26 LAB — CBC
HCT: 22.4 % — ABNORMAL LOW (ref 36.0–46.0)
Hemoglobin: 8.3 g/dL — ABNORMAL LOW (ref 12.0–15.0)
MCH: 31.4 pg (ref 26.0–34.0)
MCHC: 37.1 g/dL — ABNORMAL HIGH (ref 30.0–36.0)
MCV: 84.8 fL (ref 80.0–100.0)
Platelets: 351 10*3/uL (ref 150–400)
RBC: 2.64 MIL/uL — ABNORMAL LOW (ref 3.87–5.11)
RDW: 14.8 % (ref 11.5–15.5)
WBC: 9.4 10*3/uL (ref 4.0–10.5)
nRBC: 0.4 % — ABNORMAL HIGH (ref 0.0–0.2)

## 2022-03-26 LAB — BASIC METABOLIC PANEL
Anion gap: <3 — ABNORMAL LOW (ref 5–15)
BUN: 12 mg/dL (ref 6–20)
CO2: 27 mmol/L (ref 22–32)
Calcium: 8.6 mg/dL — ABNORMAL LOW (ref 8.9–10.3)
Chloride: 113 mmol/L — ABNORMAL HIGH (ref 98–111)
Creatinine, Ser: 0.84 mg/dL (ref 0.44–1.00)
GFR, Estimated: >60 mL/min (ref >60)
Glucose, Bld: 91 mg/dL (ref 70–99)
Potassium: 4.6 mmol/L (ref 3.5–5.1)
Sodium: 141 mmol/L (ref 135–145)

## 2022-03-26 MED ORDER — MENTHOL 3 MG MT LOZG
1.0000 | LOZENGE | OROMUCOSAL | Status: DC | PRN
  Administered 2022-03-26: 3 mg via ORAL
  Filled 2022-03-26: qty 9

## 2022-03-26 NOTE — Progress Notes (Signed)
SICKLE CELL SERVICE PROGRESS NOTE  Lindsey [name] WCB:[id number] DOB: [birth year] DOA: 03/24/2022 PCP: Massie Maroon, FNP  Assessment/Plan: Principal Problem:   Sickle cell pain crisis [facility]) Active Problems:   Sickle cell disease (HCC)   Acute low back pain   GAD (generalized anxiety disorder)  Sickle cell pain crisis: Patient is still complaining of pain but down to 4 out of 10.  She is just weak and sleepy today.  We will continue to titrate up the Dilaudid PCA and plan to discharge hopefully within the next 24 to 48 hours. Acute low back pain: Continue pain management. Anemia of chronic disease: Continue to monitor H&H Generalized anxiety disorder: Continue anxiolytics.  Code Status: Full code Family Communication: No family at bedside Disposition Plan: Home  [facility]  Pager [phone]. If 7PM-7AM, please contact night-coverage.  03/26/2022, 2:00 PM  LOS: 1 day   Brief narrative: Lindsey [name] is a 51 y.o. female with medical history significant for sickle cell disease with multiple prior hospitalizations for acute sickle cell pain crisis, generalized anxiety disorder, who is admitted to [facility] on 03/24/2022 with acute sickle cell pain crisis after presenting from home to [facility] ED complaining of low back pain.    In this patient with a documented history of sickle cell disease, the patient reports 1 day of progressive sharp bilateral paraspinal low back discomfort as well as sharp discomfort into the anterior aspects of the proximal aspects of the bilateral lower extremities, in distribution, quality, and intensity that is consistent with pain experienced at times of her prior acute sickle cell pain crisis.  She notes poor pain control in spite of home analgesic regimen, consisting of long-acting OxyContin as well as prn oxycodone, prompting her to present to [facility] emergency department today for further evaluation and management thereof.    Denies any associated chest pain, shortness of breath, palpitations, dizziness, nausea, vomiting, diarrhea, dizziness, presyncope, or syncope.  She also denies any recent subjective fever, chills, rigors, or generalized myalgias.  No recent headache, neck stiffness, rash, cough, abdominal pain, dysuria, or gross hematuria.   She notes multiple prior hospitalizations for acute sickle cell pain crisis, with most recent prior hospitalization for such occurring in October 2023, specifically 02/11/2022 to 02/14/22.    Per chart review, baseline hemoglobin range appears to be 8.5-10.5, with most recent prior hemoglobin data point noted to be 8.9 on 02/12/2022.  Additionally, per chart review, most recent reticulocyte count percentages noted to be 23.7 on the day of most recent prior admission on 02/11/22, preceded by 17.3% on 01/28/2022.   Denies any routine or recent alcohol consumption, and denies any history of recreational drug use.  Consultants: None  Procedures: None  Antibiotics:    HPI/Subjective: Patient still having pain but much better down to 5 out of 10.  Has not gotten out of bed today due to being sleepy and weak.  Overall doing better.  Objective: Vitals:   03/26/22 0512 03/26/22 0803 03/26/22 1022 03/26/22 1200  BP: (!) 99/49  (!) 101/55   Pulse: 74  (!) 55   Resp: 18 16 14 16   Temp: 97.6 F (36.4 C)  97.9 F (36.6 C)   TempSrc: Oral  Oral   SpO2: 95% 95% 100% 98%  Weight: 68.1 kg     Height:       Weight change: 5.1 kg  Intake/Output Summary (Last 24 hours) at 03/26/2022 1400 Last data filed at 03/25/2022 2143 Gross per 24 hour  Intake 654.43 ml  Output --  Net 654.43 ml    General: Alert, awake, oriented x3, in no acute distress.  HEENT: [place]/AT PEERL, EOMI Neck: Trachea midline,  no masses, no thyromegal,y no JVD, no carotid bruit OROPHARYNX:  Moist, No exudate/ erythema/lesions.  Heart: Regular rate and rhythm, without murmurs, rubs, gallops, PMI  non-displaced, no heaves or thrills on palpation.  Lungs: Clear to auscultation, no wheezing or rhonchi noted. No increased vocal fremitus resonant to percussion  Abdomen: Soft, nontender, nondistended, positive bowel sounds, no masses no hepatosplenomegaly noted..  Neuro: No focal neurological deficits noted cranial nerves II through XII grossly intact. DTRs 2+ bilaterally upper and lower extremities. Strength 5 out of 5 in bilateral upper and lower extremities. Musculoskeletal: No warm swelling or erythema around joints, no spinal tenderness noted. Psychiatric: Patient alert and oriented x3, good insight and cognition, good recent to remote recall. Lymph node survey: No cervical axillary or inguinal lymphadenopathy noted.   Data Reviewed: Basic Metabolic Panel: Recent Labs  Lab 03/24/22 1901 03/25/22 0122 03/26/22 0610  NA 143 142 141  K 4.0 3.9 4.6  CL 113* 113* 113*  CO2 25 25 27   GLUCOSE 93 104* 91  BUN 10 11 12   CREATININE 0.92 0.86 0.84  CALCIUM 9.2 8.7* 8.6*  MG  --  2.1  --    Liver Function Tests: Recent Labs  Lab 03/25/22 0122  AST 16  ALT 12  ALKPHOS 36*  BILITOT 1.1  PROT 6.6  ALBUMIN 3.8   No results for input(s): "LIPASE", "AMYLASE" in the last 168 hours. No results for input(s): "AMMONIA" in the last 168 hours. CBC: Recent Labs  Lab 03/24/22 1901 03/25/22 0122 03/26/22 0610  WBC 7.7 8.8 9.4  NEUTROABS 3.9 3.3  --   HGB 10.0* 8.8* 8.3*  HCT 26.8* 23.9* 22.4*  MCV 84.0 85.4 84.8  PLT 408* 365 351   Cardiac Enzymes: No results for input(s): "CKTOTAL", "CKMB", "CKMBINDEX", "TROPONINI" in the last 168 hours. BNP (last 3 results) No results for input(s): "BNP" in the last 8760 hours.  ProBNP (last 3 results) No results for input(s): "PROBNP" in the last 8760 hours.  CBG: No results for input(s): "GLUCAP" in the last 168 hours.  No results found for this or any previous visit (from the past 240 hour(s)).   Studies: DG Chest Port 1  View  Result Date: 03/25/2022 CLINICAL DATA:  03/28/22 Sickle cell pain crisis (HCC) 03/27/2022 EXAM: PORTABLE CHEST 1 VIEW COMPARISON:  None Available. FINDINGS: Lungs are well expanded, symmetric, and clear. No pneumothorax or pleural effusion. Cardiac size within normal limits. Pulmonary vascularity is normal. Sclerotic changes within the humeral heads bilaterally likely relate to avascular necrosis. No acute bone abnormality. IMPRESSION: 1. No active disease. 2. Bilateral humeral head avascular necrosis. Electronically Signed   By: [id number] M.D.   On: 03/25/2022 01:23    Scheduled Meds:  DULoxetine  20 mg Oral Daily   enoxaparin (LOVENOX) injection  40 mg Subcutaneous Q24H   HYDROmorphone   Intravenous Q4H   oxyCODONE  10 mg Oral Q12H   senna-docusate  1 tablet Oral BID   Continuous Infusions:  Principal Problem:   Sickle cell pain crisis (HCC) Active Problems:   Sickle cell disease (HCC)   Acute low back pain   GAD (generalized anxiety disorder)

## 2022-03-27 DIAGNOSIS — D5700 Hb-SS disease with crisis, unspecified: Secondary | ICD-10-CM | POA: Diagnosis not present

## 2022-03-27 NOTE — Progress Notes (Signed)
SICKLE CELL SERVICE PROGRESS NOTE  Lindsey [name] HLK:[id number] DOB: 02-11-1971 DOA: 03/24/2022 PCP: Massie Maroon, FNP  Assessment/Plan: Principal Problem:   Sickle cell pain crisis [facility]) Active Problems:   Sickle cell disease (HCC)   Acute low back pain   GAD (generalized anxiety disorder)  Sickle cell pain crisis: Patient appears very depressed and is still complaining of pain but down to 5 out of 10.  She is j not sure when to go home.  We will continue to titrate up the Dilaudid PCA and plan to discharge hopefully within the next 24 to 48 hours. Acute low back pain: Continue pain management. Anemia of chronic disease: Continue to monitor H&H Generalized anxiety disorder: Continue anxiolytics. Disposition: Patient should be ready for discharge home tomorrow.  Code Status: Full code Family Communication: No family at bedside Disposition Plan: Home  [facility]  Pager [phone]. If 7PM-7AM, please contact night-coverage.  03/27/2022, 11:17 PM  LOS: 2 days   Brief narrative: Lindsey [name] is a 51 y.o. female with medical history significant for sickle cell disease with multiple prior hospitalizations for acute sickle cell pain crisis, generalized anxiety disorder, who is admitted to [facility] on 03/24/2022 with acute sickle cell pain crisis after presenting from home to [facility] ED complaining of low back pain.    In this patient with a documented history of sickle cell disease, the patient reports 1 day of progressive sharp bilateral paraspinal low back discomfort as well as sharp discomfort into the anterior aspects of the proximal aspects of the bilateral lower extremities, in distribution, quality, and intensity that is consistent with pain experienced at times of her prior acute sickle cell pain crisis.  She notes poor pain control in spite of home analgesic regimen, consisting of long-acting OxyContin as well as prn oxycodone, prompting her to present to  [facility] emergency department today for further evaluation and management thereof.   Denies any associated chest pain, shortness of breath, palpitations, dizziness, nausea, vomiting, diarrhea, dizziness, presyncope, or syncope.  She also denies any recent subjective fever, chills, rigors, or generalized myalgias.  No recent headache, neck stiffness, rash, cough, abdominal pain, dysuria, or gross hematuria.   She notes multiple prior hospitalizations for acute sickle cell pain crisis, with most recent prior hospitalization for such occurring in October 2023, specifically 02/11/2022 to 02/14/22.    Per chart review, baseline hemoglobin range appears to be 8.5-10.5, with most recent prior hemoglobin data point noted to be 8.9 on 02/12/2022.  Additionally, per chart review, most recent reticulocyte count percentages noted to be 23.7 on the day of most recent prior admission on 02/11/22, preceded by 17.3% on 01/28/2022.   Denies any routine or recent alcohol consumption, and denies any history of recreational drug use.  Consultants: None  Procedures: None  Antibiotics:    HPI/Subjective: Patient still having pain but largely unmotivated.  Also appears depressed and withdrawn.  Objective: Vitals:   03/27/22 1417 03/27/22 1704 03/27/22 1959 03/27/22 2042  BP: 120/68  110/69   Pulse: 71  65   Resp: 16 16  16   Temp: 98 F (36.7 C)  98.8 F (37.1 C)   TempSrc:   Oral   SpO2: 96% 95% 93% 95%  Weight:      Height:       Weight change:   Intake/Output Summary (Last 24 hours) at 03/27/2022 2317 Last data filed at 03/27/2022 1000 Gross per 24 hour  Intake 240 ml  Output --  Net 240 ml     General: Alert, awake, oriented x3, in no acute distress.  HEENT: [place]/AT PEERL, EOMI Neck: Trachea midline,  no masses, no thyromegal,y no JVD, no carotid bruit OROPHARYNX:  Moist, No exudate/ erythema/lesions.  Heart: Regular rate and rhythm, without murmurs, rubs, gallops, PMI non-displaced, no  heaves or thrills on palpation.  Lungs: Clear to auscultation, no wheezing or rhonchi noted. No increased vocal fremitus resonant to percussion  Abdomen: Soft, nontender, nondistended, positive bowel sounds, no masses no hepatosplenomegaly noted..  Neuro: No focal neurological deficits noted cranial nerves II through XII grossly intact. DTRs 2+ bilaterally upper and lower extremities. Strength 5 out of 5 in bilateral upper and lower extremities. Musculoskeletal: No warm swelling or erythema around joints, no spinal tenderness noted. Psychiatric: Patient alert and oriented x3, good insight and cognition, good recent to remote recall. Lymph node survey: No cervical axillary or inguinal lymphadenopathy noted.   Data Reviewed: Basic Metabolic Panel: Recent Labs  Lab 03/24/22 1901 03/25/22 0122 03/26/22 0610  NA 143 142 141  K 4.0 3.9 4.6  CL 113* 113* 113*  CO2 25 25 27   GLUCOSE 93 104* 91  BUN 10 11 12   CREATININE 0.92 0.86 0.84  CALCIUM 9.2 8.7* 8.6*  MG  --  2.1  --     Liver Function Tests: Recent Labs  Lab 03/25/22 0122  AST 16  ALT 12  ALKPHOS 36*  BILITOT 1.1  PROT 6.6  ALBUMIN 3.8    No results for input(s): "LIPASE", "AMYLASE" in the last 168 hours. No results for input(s): "AMMONIA" in the last 168 hours. CBC: Recent Labs  Lab 03/24/22 1901 03/25/22 0122 03/26/22 0610  WBC 7.7 8.8 9.4  NEUTROABS 3.9 3.3  --   HGB 10.0* 8.8* 8.3*  HCT 26.8* 23.9* 22.4*  MCV 84.0 85.4 84.8  PLT 408* 365 351    Cardiac Enzymes: No results for input(s): "CKTOTAL", "CKMB", "CKMBINDEX", "TROPONINI" in the last 168 hours. BNP (last 3 results) No results for input(s): "BNP" in the last 8760 hours.  ProBNP (last 3 results) No results for input(s): "PROBNP" in the last 8760 hours.  CBG: No results for input(s): "GLUCAP" in the last 168 hours.  No results found for this or any previous visit (from the past 240 hour(s)).   Studies: DG Chest Port 1 View  Result Date:  03/25/2022 CLINICAL DATA:  03/28/22 Sickle cell pain crisis (HCC) 03/27/2022 EXAM: PORTABLE CHEST 1 VIEW COMPARISON:  None Available. FINDINGS: Lungs are well expanded, symmetric, and clear. No pneumothorax or pleural effusion. Cardiac size within normal limits. Pulmonary vascularity is normal. Sclerotic changes within the humeral heads bilaterally likely relate to avascular necrosis. No acute bone abnormality. IMPRESSION: 1. No active disease. 2. Bilateral humeral head avascular necrosis. Electronically Signed   By: [id number] M.D.   On: 03/25/2022 01:23    Scheduled Meds:  DULoxetine  20 mg Oral Daily   enoxaparin (LOVENOX) injection  40 mg Subcutaneous Q24H   HYDROmorphone   Intravenous Q4H   oxyCODONE  10 mg Oral Q12H   senna-docusate  1 tablet Oral BID   Continuous Infusions:  Principal Problem:   Sickle cell pain crisis (HCC) Active Problems:   Sickle cell disease (HCC)   Acute low back pain   GAD (generalized anxiety disorder)

## 2022-03-28 LAB — CBC
HCT: 27.5 % — ABNORMAL LOW (ref 36.0–46.0)
Hemoglobin: 10.1 g/dL — ABNORMAL LOW (ref 12.0–15.0)
MCH: 31.2 pg (ref 26.0–34.0)
MCHC: 36.7 g/dL — ABNORMAL HIGH (ref 30.0–36.0)
MCV: 84.9 fL (ref 80.0–100.0)
Platelets: 466 10*3/uL — ABNORMAL HIGH (ref 150–400)
RBC: 3.24 MIL/uL — ABNORMAL LOW (ref 3.87–5.11)
RDW: 14.9 % (ref 11.5–15.5)
WBC: 9.6 10*3/uL (ref 4.0–10.5)
nRBC: 0.6 % — ABNORMAL HIGH (ref 0.0–0.2)

## 2022-03-28 MED ORDER — OXYCODONE HCL 10 MG PO TABS: 10.0000 mg | ORAL_TABLET | Freq: Four times a day (QID) | ORAL | 0 refills | Status: DC | PRN

## 2022-03-28 MED ORDER — HYDROMORPHONE 1 MG/ML IV SOLN: INTRAVENOUS | Status: DC

## 2022-03-30 ENCOUNTER — Ambulatory Visit (HOSPITAL_COMMUNITY): Payer: Medicaid Other | Admitting: Student

## 2022-03-31 NOTE — Discharge Summary (Signed)
Physician Discharge Summary  Lindsey [name] [id number] DOB: 10-Jun-1970 DOA: 03/24/2022  PCP: Dorena Dew, FNP  Admit date: 03/24/2022  Discharge date: 03/28/2022  Discharge Diagnoses:  Principal Problem:   Sickle cell pain crisis ([place]) Active Problems:   Sickle cell disease ([place])   Acute low back pain   GAD (generalized anxiety disorder)   Discharge Condition: Stable  Disposition:   Follow-up Information     Dorena Dew, FNP Follow up.   Specialty: Family Medicine Contact information: [place]. [place] [address] [phone]                Pt is discharged home in good condition and is to follow up with Dorena Dew, FNP this week to have labs evaluated. Lindsey [name] is instructed to increase activity slowly and balance with rest for the next few days, and use prescribed medication to complete treatment of pain  Diet: Regular Wt Readings from Last 3 Encounters:  03/28/22 66.7 kg  02/11/22 63.5 kg  01/28/22 63.5 kg    History of present illness:  Lindsey [name] is a 51 year old female with a medical history significant for sickle cell disease with multiple prior hospitilizations for acute sickle cell pain crisis, generalized anxiety disorder, who is admitted to [facility] on 03/24/2022 with acute sickle cell pain crisis after presenting from home to [facility] ED complaining of low back pain.  In this patient with a documented history of sickle cell disease, the patient reports 1 day of progressive sharp bilateral paraspinal low back discomfort as well as sharp discomfort to the anterior aspects of the proximal aspects of the bilateral lower extremities, and distribution, quality, and intensity that is consistent with pain experienced at times of her prior acute sickle cell pain crisis.  She notes poor pain control in spite of home analgesic regimen, consisting of long-acting OxyContin as well as oxycodone,  prompting her to present to [facility] emergency department for further evaluation and management thereof.  Denies any associated chest pain, shortness of breath, palpitations, dizziness, nausea, vomiting, diarrhea, dizziness, presyncope, or syncope.  She did also denies any recent subjective fever, chills, rigors, or generalized myalgias.  No recent headache, neck stiffness, rash, cough, abdominal pain, dysuria, or gross hematuria.  She notes multiple prior hospitalizations for acute sickle cell pain crisis, with most prior hospitalization for such occurring in October 2023, specifically 02/11/2022 to 02/14/2022.  Per chart review, baseline hemoglobin range appears to be 8.5-10.5, with most recent prior hemoglobin data point noted to be 8.9 on 02/12/2022.  Additionally, per chart review, most recent reticulocyte count percentage is noted to be 23.7 on the day of most recent prior admission on 02/11/2022, preceded by 17.3% on 01/28/2022. Denies any routine or recent alcohol consumption, and denies history of recreational drug use.  ED course: Vital signs in ED were notable for the following.  Maximum temperature 99.3, initial heart rate of 102, subsequent improvement into the 60s to 80s following IV fluids as well as multiple doses of IV pain medication, as further detailed below; systolic blood pressures in the low 100s to 120s, respiratory rate 14-23, oxygen saturation of 94 to 100% on room air.  Labs are notable for the following: BMP notable for the following: Creatinine 0.92 compared to most recent prior serum creatinine data point of 0.81 on 02/11/2022.  CBC notable for WBC 7700 with 50% neutrophils, hemoglobin 10.0, platelet count 480.  Reticulocyte percentage 24.7%. While  in the ED, the following were administered.  Dilaudid 1.5 mg IV x 2 doses, Dilaudid 2 mg IV x 1, Toradol 30 mg x 1 dose, Benadryl 25 mg IV x 1, Zofran 4 mg IV x 1, and 1 L normal saline bolus.  In the setting of poorly controlled  pain following at least 3 doses of IV analgesia in the ED, patient is subsequently being admitted for further evaluation/management of presenting acute sickle cell pain crisis, including an emphasis on pain control.  Hospital Course:  Sickle cell disease with pain crisis: Patient was admitted for sickle cell pain crisis and managed appropriately with IVF, IV Dilaudid via PCA and IV Toradol, as well as other adjunct therapies per sickle cell pain management protocols.  IV Dilaudid PCA was weaned appropriately and patient was transition to home medications.  Patient's home medications include OxyContin 10 mg every 12 hours and oxycodone 10 mg every 6 hours.  Medication was sent to patient's pharmacy and PDMP was reviewed prior to sending and no inconsistencies were noted. Patient was slow to respond to pain medication regimen, but continues to improve daily and is requesting discharge home today.  Pain intensity is 3/10.  Patient is alert, oriented, and ambulating without assistance.  Patient will follow-up with PCP for medication management in 2 weeks.  Patient was therefore discharged home today in a hemodynamically stable condition.   Lindsey [name] will follow-up with PCP within 1 week of this discharge. Lindsey [name] was counseled extensively about nonpharmacologic means of pain management, patient verbalized understanding and was appreciative of  the care received during this admission.   We discussed the need for good hydration, monitoring of hydration status, avoidance of heat, cold, stress, and infection triggers. We discussed the need to be adherent with home medications. Patient was reminded of the need to seek medical attention immediately if any symptom of bleeding, anemia, or infection occurs.  Discharge Exam: Vitals:   03/28/22 1101 03/28/22 1158  BP: 113/66   Pulse: 70   Resp: 20 16  Temp: 98.8 F (37.1 C)   SpO2: 95% 95%   Vitals:   03/28/22 0608 03/28/22 0815 03/28/22 1101 03/28/22  1158  BP: 114/70  113/66   Pulse: 71  70   Resp: 16 16 20 16   Temp: 98.8 F (37.1 C)  98.8 F (37.1 C)   TempSrc: Oral  Oral   SpO2: 96% 95% 95% 95%  Weight: 66.7 kg     Height:        General appearance : Awake, alert, not in any distress. Speech Clear. Not toxic looking HEENT: Atraumatic and Normocephalic, pupils equally reactive to light and accomodation Neck: Supple, no JVD. No cervical lymphadenopathy.  Chest: Good air entry bilaterally, no added sounds  CVS: S1 S2 regular, no murmurs.  Abdomen: Bowel sounds present, Non tender and not distended with no gaurding, rigidity or rebound. Extremities: B/L Lower Ext shows no edema, both legs are warm to touch Neurology: Awake alert, and oriented X 3, CN II-XII intact, Non focal Skin: No Rash  Discharge Instructions  Discharge Instructions     Discharge patient   Complete by: As directed    Discharge disposition: 01-Home or Self Care   Discharge patient date: 03/28/2022      Allergies as of 03/28/2022       Reactions   Ketamine Anxiety, Other (See Comments)   Tachycardia        Medication List     TAKE these medications  ALPRAZolam 0.25 MG tablet Commonly known as: XANAX Take 1 tablet (0.25 mg total) by mouth 2 (two) times daily as needed for anxiety.   Benadryl Allergy 25 MG tablet Generic drug: diphenhydrAMINE Take 50 mg by mouth at bedtime as needed for sleep.   carboxymethylcellulose 0.5 % Soln Commonly known as: REFRESH PLUS Place 1 drop into both eyes daily as needed (dry eyes).   DULoxetine 20 MG capsule Commonly known as: Cymbalta Take 1 capsule (20 mg total) by mouth daily.   folic acid 1 MG tablet Commonly known as: FOLVITE Take 1 tablet (1 mg total) by mouth daily.   gabapentin 400 MG capsule Commonly known as: NEURONTIN Take 1 capsule (400 mg total) by mouth 3 (three) times daily.   ibuprofen 200 MG tablet Commonly known as: ADVIL Take 400 mg by mouth every 6 (six) hours as needed  for mild pain.   oxyCODONE 10 mg 12 hr tablet Commonly known as: OXYCONTIN Take 1 tablet (10 mg total) by mouth every 12 (twelve) hours.   Oxycodone HCl 10 MG Tabs Take 1 tablet (10 mg total) by mouth every 6 (six) hours as needed for up to 15 days (for moderate to severe pain).        The results of significant diagnostics from this hospitalization (including imaging, microbiology, ancillary and laboratory) are listed below for reference.    Significant Diagnostic Studies: DG Chest Port 1 View  Result Date: 03/25/2022 CLINICAL DATA:  [id number] Sickle cell pain crisis ([place]) [id number] EXAM: PORTABLE CHEST 1 VIEW COMPARISON:  None Available. FINDINGS: Lungs are well expanded, symmetric, and clear. No pneumothorax or pleural effusion. Cardiac size within normal limits. Pulmonary vascularity is normal. Sclerotic changes within the humeral heads bilaterally likely relate to avascular necrosis. No acute bone abnormality. IMPRESSION: 1. No active disease. 2. Bilateral humeral head avascular necrosis. Electronically Signed   By: Fidela Salisbury M.D.   On: 03/25/2022 01:23    Microbiology: No results found for this or any previous visit (from the past 240 hour(s)).   Labs: Basic Metabolic Panel: Recent Labs  Lab 03/25/22 0122 03/26/22 0610  NA 142 141  K 3.9 4.6  CL 113* 113*  CO2 25 27  GLUCOSE 104* 91  BUN 11 12  CREATININE 0.86 0.84  CALCIUM 8.7* 8.6*  MG 2.1  --    Liver Function Tests: Recent Labs  Lab 03/25/22 0122  AST 16  ALT 12  ALKPHOS 36*  BILITOT 1.1  PROT 6.6  ALBUMIN 3.8   No results for input(s): "LIPASE", "AMYLASE" in the last 168 hours. No results for input(s): "AMMONIA" in the last 168 hours. CBC: Recent Labs  Lab 03/25/22 0122 03/26/22 0610 03/28/22 1030  WBC 8.8 9.4 9.6  NEUTROABS 3.3  --   --   HGB 8.8* 8.3* 10.1*  HCT 23.9* 22.4* 27.5*  MCV 85.4 84.8 84.9  PLT 365 351 466*   Cardiac Enzymes: No results for input(s): "CKTOTAL", "CKMB",  "CKMBINDEX", "TROPONINI" in the last 168 hours. BNP: Invalid input(s): "POCBNP" CBG: No results for input(s): "GLUCAP" in the last 168 hours.  Time coordinating discharge: 30 minutes  Signed:  [name]  APRN, MSN, FNP-C Patient [place], [place] [address] [phone]  [facility] 03/31/2022, 11:18 PM

## 2022-04-04 ENCOUNTER — Emergency Department (HOSPITAL_COMMUNITY): Payer: Medicaid Other

## 2022-04-04 ENCOUNTER — Inpatient Hospital Stay (HOSPITAL_COMMUNITY)
Admission: EM | Admit: 2022-04-04 | Discharge: 2022-04-07 | DRG: 812 | Disposition: A | Discharge status: Home / Self Care | Payer: Medicaid Other | Attending: Internal Medicine | Admitting: Internal Medicine

## 2022-04-04 ENCOUNTER — Encounter (HOSPITAL_COMMUNITY): Payer: Self-pay

## 2022-04-04 ENCOUNTER — Other Ambulatory Visit: Payer: Self-pay

## 2022-04-04 DIAGNOSIS — G8929 Other chronic pain: Secondary | ICD-10-CM | POA: Diagnosis present

## 2022-04-04 DIAGNOSIS — F411 Generalized anxiety disorder: Secondary | ICD-10-CM | POA: Diagnosis present

## 2022-04-04 DIAGNOSIS — Z79899 Other long term (current) drug therapy: Secondary | ICD-10-CM

## 2022-04-04 DIAGNOSIS — F17290 Nicotine dependence, other tobacco product, uncomplicated: Secondary | ICD-10-CM | POA: Diagnosis present

## 2022-04-04 DIAGNOSIS — Z888 Allergy status to other drugs, medicaments and biological substances status: Secondary | ICD-10-CM

## 2022-04-04 DIAGNOSIS — G894 Chronic pain syndrome: Secondary | ICD-10-CM | POA: Diagnosis present

## 2022-04-04 DIAGNOSIS — D638 Anemia in other chronic diseases classified elsewhere: Secondary | ICD-10-CM | POA: Diagnosis present

## 2022-04-04 DIAGNOSIS — F1120 Opioid dependence, uncomplicated: Secondary | ICD-10-CM | POA: Diagnosis present

## 2022-04-04 DIAGNOSIS — F17210 Nicotine dependence, cigarettes, uncomplicated: Secondary | ICD-10-CM | POA: Diagnosis present

## 2022-04-04 DIAGNOSIS — D5700 Hb-SS disease with crisis, unspecified: Secondary | ICD-10-CM | POA: Diagnosis present

## 2022-04-04 LAB — CBC
HCT: 25.4 % — ABNORMAL LOW (ref 36.0–46.0)
Hemoglobin: 9.4 g/dL — ABNORMAL LOW (ref 12.0–15.0)
MCH: 31.6 pg (ref 26.0–34.0)
MCHC: 37 g/dL — ABNORMAL HIGH (ref 30.0–36.0)
MCV: 85.5 fL (ref 80.0–100.0)
Platelets: 399 10*3/uL (ref 150–400)
RBC: 2.97 MIL/uL — ABNORMAL LOW (ref 3.87–5.11)
RDW: 14 % (ref 11.5–15.5)
WBC: 13.3 10*3/uL — ABNORMAL HIGH (ref 4.0–10.5)
nRBC: 0.2 % (ref 0.0–0.2)

## 2022-04-04 LAB — CBC WITH DIFFERENTIAL/PLATELET
Abs Immature Granulocytes: 0.05 10*3/uL (ref 0.00–0.07)
Basophils Absolute: 0.1 10*3/uL (ref 0.0–0.1)
Basophils Relative: 0 %
Eosinophils Absolute: 0.1 10*3/uL (ref 0.0–0.5)
Eosinophils Relative: 1 %
HCT: 26.2 % — ABNORMAL LOW (ref 36.0–46.0)
Hemoglobin: 9.8 g/dL — ABNORMAL LOW (ref 12.0–15.0)
Immature Granulocytes: 0 %
Lymphocytes Relative: 11 %
Lymphs Abs: 1.5 10*3/uL (ref 0.7–4.0)
MCH: 31.9 pg (ref 26.0–34.0)
MCHC: 37.4 g/dL — ABNORMAL HIGH (ref 30.0–36.0)
MCV: 85.3 fL (ref 80.0–100.0)
Monocytes Absolute: 0.9 10*3/uL (ref 0.1–1.0)
Monocytes Relative: 7 %
Neutro Abs: 10.8 10*3/uL — ABNORMAL HIGH (ref 1.7–7.7)
Neutrophils Relative %: 81 %
Platelets: 392 10*3/uL (ref 150–400)
RBC: 3.07 MIL/uL — ABNORMAL LOW (ref 3.87–5.11)
RDW: 14 % (ref 11.5–15.5)
WBC: 13.4 10*3/uL — ABNORMAL HIGH (ref 4.0–10.5)
nRBC: 0 % (ref 0.0–0.2)

## 2022-04-04 LAB — BASIC METABOLIC PANEL
Anion gap: 8 (ref 5–15)
BUN: 7 mg/dL (ref 6–20)
CO2: 24 mmol/L (ref 22–32)
Calcium: 8.9 mg/dL (ref 8.9–10.3)
Chloride: 106 mmol/L (ref 98–111)
Creatinine, Ser: 0.64 mg/dL (ref 0.44–1.00)
GFR, Estimated: >60 mL/min (ref >60)
Glucose, Bld: 111 mg/dL — ABNORMAL HIGH (ref 70–99)
Potassium: 3.8 mmol/L (ref 3.5–5.1)
Sodium: 138 mmol/L (ref 135–145)

## 2022-04-04 LAB — RETICULOCYTES
Immature Retic Fract: 28.2 % — ABNORMAL HIGH (ref 2.3–15.9)
RBC.: 3.19 MIL/uL — ABNORMAL LOW (ref 3.87–5.11)
Retic Count, Absolute: 131.7 10*3/uL (ref 19.0–186.0)
Retic Ct Pct: 4.1 % — ABNORMAL HIGH (ref 0.4–3.1)

## 2022-04-04 LAB — TROPONIN I (HIGH SENSITIVITY)
Troponin I (High Sensitivity): 2 ng/L (ref <18)
Troponin I (High Sensitivity): 2 ng/L (ref <18)

## 2022-04-04 MED ORDER — POLYETHYLENE GLYCOL 3350 17 G PO PACK: 17.0000 g | PACK | Freq: Every day | ORAL | Status: DC | PRN

## 2022-04-04 MED ORDER — GABAPENTIN 400 MG PO CAPS
400.0000 mg | ORAL_CAPSULE | Freq: Three times a day (TID) | ORAL | Status: DC
  Administered 2022-04-04 – 2022-04-07 (×10): 400 mg via ORAL
  Filled 2022-04-04 (×10): qty 1

## 2022-04-04 MED ORDER — ONDANSETRON HCL 4 MG/2ML IJ SOLN
4.0000 mg | INTRAMUSCULAR | Status: DC | PRN
  Administered 2022-04-04 (×2): 4 mg via INTRAVENOUS
  Filled 2022-04-04 (×2): qty 2

## 2022-04-04 MED ORDER — KETOROLAC TROMETHAMINE 15 MG/ML IJ SOLN
15.0000 mg | INTRAMUSCULAR | Status: AC
  Administered 2022-04-04: 15 mg via INTRAVENOUS
  Filled 2022-04-04: qty 1

## 2022-04-04 MED ORDER — HYDROMORPHONE HCL 2 MG/ML IJ SOLN
2.0000 mg | INTRAMUSCULAR | Status: AC
  Administered 2022-04-04: 2 mg via INTRAVENOUS
  Filled 2022-04-04: qty 1

## 2022-04-04 MED ORDER — SODIUM CHLORIDE 0.45 % IV SOLN: INTRAVENOUS | Status: DC

## 2022-04-04 MED ORDER — KETOROLAC TROMETHAMINE 15 MG/ML IJ SOLN
15.0000 mg | Freq: Four times a day (QID) | INTRAMUSCULAR | Status: DC
  Administered 2022-04-04 – 2022-04-07 (×13): 15 mg via INTRAVENOUS
  Filled 2022-04-04 (×13): qty 1

## 2022-04-04 MED ORDER — ENOXAPARIN SODIUM 40 MG/0.4ML IJ SOSY
40.0000 mg | PREFILLED_SYRINGE | INTRAMUSCULAR | Status: DC
  Administered 2022-04-04 – 2022-04-06 (×3): 40 mg via SUBCUTANEOUS
  Filled 2022-04-04 (×4): qty 0.4

## 2022-04-04 MED ORDER — SODIUM CHLORIDE 0.9% FLUSH: 9.0000 mL | INTRAVENOUS | Status: DC | PRN

## 2022-04-04 MED ORDER — FOLIC ACID 1 MG PO TABS
1.0000 mg | ORAL_TABLET | Freq: Every day | ORAL | Status: DC
  Administered 2022-04-04 – 2022-04-07 (×4): 1 mg via ORAL
  Filled 2022-04-04 (×4): qty 1

## 2022-04-04 MED ORDER — SENNOSIDES-DOCUSATE SODIUM 8.6-50 MG PO TABS
1.0000 | ORAL_TABLET | Freq: Two times a day (BID) | ORAL | Status: DC
  Administered 2022-04-04 – 2022-04-06 (×5): 1 via ORAL
  Filled 2022-04-04 (×7): qty 1

## 2022-04-04 MED ORDER — HYDROMORPHONE 1 MG/ML IV SOLN
INTRAVENOUS | Status: DC
  Administered 2022-04-04: 30 mg via INTRAVENOUS
  Administered 2022-04-05: 1.5 mg via INTRAVENOUS
  Administered 2022-04-05: 0.5 mg via INTRAVENOUS
  Filled 2022-04-04: qty 30

## 2022-04-04 MED ORDER — ONDANSETRON HCL 4 MG/2ML IJ SOLN: 4.0000 mg | Freq: Four times a day (QID) | INTRAMUSCULAR | Status: DC | PRN

## 2022-04-04 MED ORDER — SODIUM CHLORIDE 0.9 % IV SOLN
12.5000 mg | Freq: Once | INTRAVENOUS | Status: AC
  Administered 2022-04-04: 12.5 mg via INTRAVENOUS
  Filled 2022-04-04: qty 12.5

## 2022-04-04 MED ORDER — NALOXONE HCL 0.4 MG/ML IJ SOLN: 0.4000 mg | INTRAMUSCULAR | Status: DC | PRN

## 2022-04-04 MED ORDER — OXYCODONE HCL ER 10 MG PO T12A
10.0000 mg | EXTENDED_RELEASE_TABLET | Freq: Two times a day (BID) | ORAL | Status: DC
  Administered 2022-04-04 – 2022-04-07 (×7): 10 mg via ORAL
  Filled 2022-04-04 (×7): qty 1

## 2022-04-04 MED ORDER — DIPHENHYDRAMINE HCL 25 MG PO CAPS
25.0000 mg | ORAL_CAPSULE | ORAL | Status: DC | PRN
  Administered 2022-04-04: 25 mg via ORAL
  Filled 2022-04-04: qty 1

## 2022-04-04 MED ORDER — DULOXETINE HCL 20 MG PO CPEP: 20.0000 mg | ORAL_CAPSULE | Freq: Every day | ORAL | Status: DC

## 2022-04-04 NOTE — ED Triage Notes (Addendum)
Having chest pain due to her sickle cell. Patient is actively vomiting in triage.

## 2022-04-04 NOTE — TOC Progression Note (Signed)
Transition of Care [facility]) - Progression Note    Patient Details  Name: Lindsey [name] MRN: [id number] Date of Birth: 30-Mar-1971  Transition of Care [facility]) CM/SW Contact  Beckie Busing, RN Phone Number:[phone]  04/04/2022, 4:09 PM  Clinical Narrative:     Transition of Care [facility]) Screening Note   Patient Details  Name: Lindsey [name] Date of Birth: 01-27-1971   Transition of Care [facility]) CM/SW Contact:    Beckie Busing, RN Phone Number: 04/04/2022, 4:10 PM    Transition of Care [facility]) has reviewed patient and no TOC needs have been identified at this time. We will continue to monitor patient advancement through interdisciplinary progression rounds. If new patient transition needs arise, please place a TOC consult.          Expected Discharge Plan and Services                                                 Social Determinants of Health (SDOH) Interventions    Readmission Risk Interventions    02/14/2022   10:02 AM 11/30/2021   10:59 AM 10/29/2021    1:04 PM  Readmission Risk Prevention Plan  Transportation Screening Complete Complete Complete  PCP or Specialist Appt within 3-5 Days   Complete  HRI or Home Care Consult   Complete  Social Work Consult for Recovery Care Planning/Counseling   Complete  Palliative Care Screening   Not Applicable  Medication Review [facility]) Complete Complete Complete  PCP or Specialist appointment within 3-5 days of discharge Complete Complete   HRI or Home Care Consult Complete Complete   SW Recovery Care/Counseling Consult Complete Complete   Palliative Care Screening Not Applicable Not Applicable   Skilled Nursing Facility Not Applicable Not Applicable

## 2022-04-04 NOTE — H&P (Cosign Needed Addendum)
H&P  Patient Demographics:  Lindsey [name], is a 51 y.o. female  MRN: [id number]   DOB - 09/13/1970  Admit Date - 04/04/2022  Outpatient Primary MD for the patient is Massie Maroon, FNP  Chief Complaint  Patient presents with   Sickle Cell Pain Crisis      HPI:   Lindsey [name]  is a 52 y.o. female with a medical history significant for sickle cell disease, chronic pain syndrome, opiate dependence and tolerance, frequent hospitalizations, and history of anemia of chronic disease presents to the emergency department with complaints of low back and lower extremity pain over the past several days.  Patient also endorses chest pain.  Patient states that pain intensity has been elevated for 4 days and unrelieved by her home medications.  Her home medications include OxyContin, oxycodone, and gabapentin.  Patient attributes pain crisis to changes in weather and increase stressors during the recent holiday.  Patient rates pain as 9/10, constant, and throbbing.  Chest pain primarily central, nonradiating.  Patient has not had any recent falls or injuries.  No headache, fever, chills, shortness of breath, or chest pain.  No urinary symptoms, nausea, vomiting, or diarrhea. No sick contacts, recent travel, or known exposure to COVID-19.  ER course: While in ER, vital signs remained stable.  Recorded as:BP 108/64 (BP Location: Left Arm)   Pulse 62   Temp 98.2 F (36.8 C) (Oral)   Resp 20   Ht 5' (1.524 m)   Wt 61.2 kg   LMP 07/09/2021 (Approximate)   SpO2 96%   BMI 26.37 kg/m  Complete blood count notable for WBCs 13.4, hemoglobin 9.8 g/dL, and platelets 662,947.  Basic metabolic panel unremarkable.  Troponin negative.  Reticulocytes 4.1%.  Chest x-ray shows no acute cardiopulmonary process.  Patient's pain persists despite IV Dilaudid, IV fluids, and IV Toradol.  Patient admitted for further management of sickle cell pain crisis   Review of systems:  In addition to the HPI above, patient  reports No fever or chills No Headache, No changes with vision or hearing No problems swallowing food or liquids No chest pain, cough or shortness of breath No abdominal pain, No nausea or vomiting, Bowel movements are regular No blood in stool or urine No dysuria No new skin rashes or bruises No new joints pains-aches No new weakness, tingling, numbness in any extremity No recent weight gain or loss No polyuria, polydypsia or polyphagia No significant Mental Stressors  A full 10 point Review of Systems was done, except as stated above, all other Review of Systems were negative.  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      Past Surgical History:  Procedure Laterality Date   APPENDECTOMY     CESAREAN SECTION     OTHER SURGICAL HISTORY     c-section     Social History:   Social History   Tobacco Use   Smoking status: Some Days    Packs/day: 0.00    Types: Cigars, Cigarettes   Smokeless tobacco: Never  Substance Use Topics  Alcohol use: Yes    Comment: occasionally     Lives - At home   Family History :   Family History  Problem Relation Age of Onset   Stroke Neg Hx        none that she knows of    Seizures Neg Hx      Home Medications:   Prior to Admission medications   Medication Sig Start Date End Date Taking? Authorizing Provider  ALPRAZolam (XANAX) 0.25 MG tablet Take 1 tablet (0.25 mg total) by mouth 2 (two) times daily as needed for anxiety. Patient not taking: Reported on 03/25/2022 12/31/21   Rometta Emery, MD  carboxymethylcellulose (REFRESH PLUS) 0.5 % SOLN Place 1 drop into  both eyes daily as needed (dry eyes).    [provider]  diphenhydrAMINE (BENADRYL ALLERGY) 25 MG tablet Take 50 mg by mouth at bedtime as needed for sleep.    [provider]  DULoxetine (CYMBALTA) 20 MG capsule Take 1 capsule (20 mg total) by mouth daily. Patient not taking: Reported on 03/25/2022 12/31/21 12/31/22  Rometta Emery, MD  folic acid (FOLVITE) 1 MG tablet Take 1 tablet (1 mg total) by mouth daily. 12/31/21 12/31/22  Rometta Emery, MD  gabapentin (NEURONTIN) 400 MG capsule Take 1 capsule (400 mg total) by mouth 3 (three) times daily. 01/28/22   Massie Maroon, FNP  ibuprofen (ADVIL) 200 MG tablet Take 400 mg by mouth every 6 (six) hours as needed for mild pain.    [provider]  oxyCODONE (OXYCONTIN) 10 mg 12 hr tablet Take 1 tablet (10 mg total) by mouth every 12 (twelve) hours. 03/06/22   Massie Maroon, FNP  Oxycodone HCl 10 MG TABS Take 1 tablet (10 mg total) by mouth every 6 (six) hours as needed for up to 15 days (for moderate to severe pain). 03/29/22 04/13/22  Massie Maroon, FNP     Allergies:   Allergies  Allergen Reactions   Ketamine Anxiety and Other (See Comments)    Tachycardia     Physical Exam:   Vitals:   Vitals:   04/04/22 0918 04/04/22 1211  BP:    Pulse:    Resp: 20 17  Temp:    SpO2: 98% 98%    Physical Exam: Constitutional: Patient appears well-developed and well-nourished. Not in obvious distress. HENT: Normocephalic, atraumatic, External right and left ear normal. Oropharynx is clear and moist.  Eyes: Conjunctivae and EOM are normal. PERRLA, no scleral icterus. Neck: Normal ROM. Neck supple. No JVD. No tracheal deviation. No thyromegaly. CVS: RRR, S1/S2 +, no murmurs, no gallops, no carotid bruit.  Pulmonary: Effort and breath sounds normal, no stridor, rhonchi, wheezes, rales.  Abdominal: Soft. BS +, no distension, tenderness, rebound or guarding.  Musculoskeletal: Normal range of motion. No edema  and no tenderness.  Lymphadenopathy: No lymphadenopathy noted, cervical, inguinal or axillary Neuro: Alert. Normal reflexes, muscle tone coordination. No cranial nerve deficit. Skin: Skin is warm and dry. No rash noted. Not diaphoretic. No erythema. No pallor. Psychiatric: Normal mood and affect. Behavior, judgment, thought content normal.   Data Review:   CBC Recent Labs  Lab 04/04/22 0505 04/04/22 0859  WBC 13.4* 13.3*  HGB 9.8* 9.4*  HCT 26.2* 25.4*  PLT 392 399  MCV 85.3 85.5  MCH 31.9 31.6  MCHC 37.4* 37.0*  RDW 14.0 14.0  LYMPHSABS 1.5  --   MONOABS 0.9  --   EOSABS 0.1  --   BASOSABS 0.1  --    ------------------------------------------------------------------------------------------------------------------  Chemistries  Recent Labs  Lab 04/04/22 0505  NA 138  K 3.8  CL 106  CO2 24  GLUCOSE 111*  BUN 7  CREATININE 0.64  CALCIUM 8.9   ------------------------------------------------------------------------------------------------------------------ estimated creatinine clearance is 68.8 mL/min (by C-G formula based on SCr of 0.64 mg/dL). ------------------------------------------------------------------------------------------------------------------ No results for input(s): "TSH", "T4TOTAL", "T3FREE", "THYROIDAB" in the last 72 hours.  Invalid input(s): "FREET3"  Coagulation profile No results for input(s): "INR", "PROTIME" in the last 168 hours. ------------------------------------------------------------------------------------------------------------------- No results for input(s): "DDIMER" in the last 72 hours. -------------------------------------------------------------------------------------------------------------------  Cardiac Enzymes No results for input(s): "CKMB", "TROPONINI", "MYOGLOBIN" in the last 168 hours.  Invalid input(s):  "CK" ------------------------------------------------------------------------------------------------------------------ No results found for: "BNP"  ---------------------------------------------------------------------------------------------------------------  Urinalysis    Component Value Date/Time   COLORURINE YELLOW 05/31/2021 1350   APPEARANCEUR TURBID (A) 05/31/2021 1350   LABSPEC 1.012 05/31/2021 1350   PHURINE 5.0 05/31/2021 1350   GLUCOSEU NEGATIVE 05/31/2021 1350   HGBUR SMALL (A) 05/31/2021 1350   BILIRUBINUR NEGATIVE 05/31/2021 1350   BILIRUBINUR neg 04/07/2020 1014   KETONESUR NEGATIVE 05/31/2021 1350   PROTEINUR 100 (A) 05/31/2021 1350   UROBILINOGEN 0.2 04/07/2020 1014   UROBILINOGEN 1.0 12/25/2012 1755   NITRITE NEGATIVE 05/31/2021 1350   LEUKOCYTESUR MODERATE (A) 05/31/2021 1350    ----------------------------------------------------------------------------------------------------------------   Imaging Results:    DG Chest 2 View  Result Date: 04/04/2022 CLINICAL DATA:  Chest pain, history of sickle cell disease EXAM: CHEST - 2 VIEW COMPARISON:  03/25/2022 FINDINGS: Cardiac shadow is within normal limits. The lungs are clear bilaterally. No focal infiltrate or effusion is seen. Changes in the thoracic spine consistent with the given clinical history are noted. 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 IMPRESSION: No acute abnormality noted. Bony sequelae of known sickle cell disease. Electronically Signed   By: Alcide Clever M.D.   On: 04/04/2022 03:58     Assessment & Plan:  Principal Problem:   Sickle cell pain crisis (HCC) Active Problems:   Chronic pain   Anemia of chronic disease   GAD (generalized anxiety disorder)  Sickle cell disease with pain crisis: IV fluids, 0.45% saline at 75 mL/h IV Dilaudid PCA with settings of 0.5 mg/10/3. Toradol 15 mg IV every 6 hours OxyContin 10 mg every 12 hours Hold oxycodone,  will restart as pain intensity improves. Monitor vital signs very closely, reevaluate pain scale regularly, and supplemental oxygen as needed  Leukocytosis: WBCs mildly elevated.  More than likely reactive.  Patient afebrile.  Chest x-ray shows no acute cardiopulmonary process.  Follow labs in AM.  Anemia of chronic disease: Patient's hemoglobin is stable and consistent with her baseline.  There is no clinical indication for blood transfusion at this time.  Continue to monitor closely.  Chronic pain syndrome: Continue home medications  Generalized anxiety disorder: Clinically stable.  No suicidal homicidal ideations.  Continue home medications. DVT Prophylaxis: Subcut Lovenox   AM Labs Ordered, also please review Full Orders  Family Communication: Admission, patient's condition and plan of care including tests being ordered have been discussed with the patient who indicate understanding and agree with the plan and Code Status.  Code Status: Full Code  Consults called: None    Admission status: Inpatient    Time spent in minutes : 30 minutes   [name]  APRN, MSN, FNP-C Patient Care [facility] [address] [place], [place] [address] [phone]  04/04/2022 at 1:09 PM

## 2022-04-04 NOTE — ED Provider Notes (Signed)
[facility]-EMERGENCY DEPT Provider Note   CSN: [id number] Arrival date & time: 04/04/22  5638     History  Chief Complaint  Patient presents with   Sickle Cell Pain Crisis    Lindsey [name] is a 51 y.o. female with past medical history significant for narcotic use, sickle cell disease, anemia of chronic disease, anxiety who presents with concern for chest pain, back pain secondary to sickle cell crisis.  Patient with pain worsening over the last 2 days.  Patient reports that she is taking her home medications as prescribed.  Patient does not frequently come to the emergency department and frequently does get admitted if she has to come to the emergency department.  She reports her pain is not normally located in her chest.  She denies that her sternal nature of pain.   Sickle Cell Pain Crisis      Home Medications Prior to Admission medications   Medication Sig Start Date End Date Taking? Authorizing Provider  ALPRAZolam (XANAX) 0.25 MG tablet Take 1 tablet (0.25 mg total) by mouth 2 (two) times daily as needed for anxiety. Patient not taking: Reported on 03/25/2022 12/31/21   Rometta Emery, MD  carboxymethylcellulose (REFRESH PLUS) 0.5 % SOLN Place 1 drop into both eyes daily as needed (dry eyes).    [provider]  diphenhydrAMINE (BENADRYL ALLERGY) 25 MG tablet Take 50 mg by mouth at bedtime as needed for sleep.    [provider]  DULoxetine (CYMBALTA) 20 MG capsule Take 1 capsule (20 mg total) by mouth daily. Patient not taking: Reported on 03/25/2022 12/31/21 12/31/22  Rometta Emery, MD  folic acid (FOLVITE) 1 MG tablet Take 1 tablet (1 mg total) by mouth daily. 12/31/21 12/31/22  Rometta Emery, MD  gabapentin (NEURONTIN) 400 MG capsule Take 1 capsule (400 mg total) by mouth 3 (three) times daily. 01/28/22   Massie Maroon, FNP  ibuprofen (ADVIL) 200 MG tablet Take 400 mg by mouth every 6 (six) hours as needed for mild  pain.    [provider]  oxyCODONE (OXYCONTIN) 10 mg 12 hr tablet Take 1 tablet (10 mg total) by mouth every 12 (twelve) hours. 03/06/22   Massie Maroon, FNP  Oxycodone HCl 10 MG TABS Take 1 tablet (10 mg total) by mouth every 6 (six) hours as needed for up to 15 days (for moderate to severe pain). 03/29/22 04/13/22  Massie Maroon, FNP      Allergies    Ketamine    Review of Systems   Review of Systems  Physical Exam Updated Vital Signs BP (!) 103/57   Pulse (!) 58   Temp 98.5 F (36.9 C) (Oral)   Resp 16   Ht 5' (1.524 m)   Wt 61.2 kg   LMP 07/09/2021 (Approximate)   SpO2 97%   BMI 26.37 kg/m  Physical Exam  ED Results / Procedures / Treatments   Labs (all labs ordered are listed, but only abnormal results are displayed) Labs Reviewed  CBC WITH DIFFERENTIAL/PLATELET - Abnormal; Notable for the following components:      Result Value   WBC 13.4 (*)    RBC 3.07 (*)    Hemoglobin 9.8 (*)    HCT 26.2 (*)    MCHC 37.4 (*)    Neutro Abs 10.8 (*)    All other components within normal limits  BASIC METABOLIC PANEL - Abnormal; Notable for the following components:   Glucose, Bld 111 (*)  All other components within normal limits  RETICULOCYTES - Abnormal; Notable for the following components:   Retic Ct Pct 4.1 (*)    RBC. 3.19 (*)    Immature Retic Fract 28.2 (*)    All other components within normal limits  TROPONIN I (HIGH SENSITIVITY)  TROPONIN I (HIGH SENSITIVITY)    EKG None  Radiology DG Chest 2 View  Result Date: 04/04/2022 CLINICAL DATA:  Chest pain, history of sickle cell disease EXAM: CHEST - 2 VIEW COMPARISON:  03/25/2022 FINDINGS: Cardiac shadow is within normal limits. The lungs are clear bilaterally. No focal infiltrate or effusion is seen. Changes in the thoracic spine consistent with the given clinical history are noted. 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 IMPRESSION: No acute abnormality  noted. Bony sequelae of known sickle cell disease. Electronically Signed   By: Inez Catalina M.D.   On: 04/04/2022 03:58    Procedures Procedures    Medications Ordered in ED Medications  0.45 % sodium chloride infusion ( Intravenous New Bag/Given 04/04/22 0400)  ondansetron (ZOFRAN) injection 4 mg (4 mg Intravenous Given 04/04/22 0416)  ketorolac (TORADOL) 15 MG/ML injection 15 mg (15 mg Intravenous Given 04/04/22 0401)  HYDROmorphone (DILAUDID) injection 2 mg (2 mg Intravenous Given 04/04/22 0402)  HYDROmorphone (DILAUDID) injection 2 mg (2 mg Intravenous Given 04/04/22 0448)  HYDROmorphone (DILAUDID) injection 2 mg (2 mg Intravenous Given 04/04/22 0533)  diphenhydrAMINE (BENADRYL) 12.5 mg in sodium chloride 0.9 % 50 mL IVPB (0 mg Intravenous Stopped 04/04/22 0438)    ED Course/ Medical Decision Making/ A&P                           Medical Decision Making Amount and/or Complexity of Data Reviewed Labs: ordered. Radiology: ordered.  Risk Prescription drug management.   This patient is a 51 y.o. female who presents to the ED for concern of chest pain, sickle cell pain, this involves an extensive number of treatment options, and is a complaint that carries with it a high risk of complications and morbidity. The emergent differential diagnosis prior to evaluation includes, but is not limited to,  ACS, AAS, PE, Mallory-Weiss, Boerhaave's, Pneumonia, acute bronchitis, asthma or COPD exacerbation, anxiety, MSK pain or traumatic injury to the chest, acid reflux, acute chest syndrome, splenic infarct, pulmonary infarct versus other .   This is not an exhaustive differential.   Past Medical History / Co-morbidities / Social History: Sickle cell disease, chronic pain syndrome  Additional history: Chart reviewed. Pertinent results include: reviewed labwork, imaging from previous ED visits  Physical Exam: Physical exam performed. The pertinent findings include: patient in acute distress  on initial eval, nearly hyperventilating 2/2 pain, VSS and afebrile otherwise  Lab Tests: I ordered, and personally interpreted labs.  The pertinent results include: Patient with elevated reticulocytes, leukocytosis, white blood cell 13.4, she has anemia with hemoglobin 9.8, not significantly changed from baseline, and normal platelets, no evidence of aplastic crisis.  Her BMP is unremarkable.  Initial troponin 2, no evidence of acute ischemia.   Imaging Studies: I ordered imaging studies including plain film chest x-ray. I independently visualized and interpreted imaging which showed no acute intrathoracic abnormality. I agree with the radiologist interpretation.   Cardiac Monitoring:  The patient was maintained on a cardiac monitor.  My attending physician Dr. Stark Jock viewed and interpreted the cardiac monitored which showed an underlying rhythm of: NSR. I agree with this interpretation.  Medications: I ordered medication including Dilaudid, fluid bolus, Benadryl, Toradol for pain. Reevaluation of the patient after these medicines showed that the patient has some improvement of pain but is overall still in a fair amount of significant pain.  Consultations Obtained: I requested consultation with the hospitalist, spoke with Dr. Bridgett Larsson, and discussed lab and imaging findings as well as pertinent plan - they recommend: admission   Disposition: After consideration of the diagnostic results and the patients response to treatment, I feel that patient would benefit from admission for pain control in poorly controlled sickle cell crisis .   I discussed this case with my attending physician Dr. Stark Jock who cosigned this note including patient's presenting symptoms, physical exam, and planned diagnostics and interventions. Attending physician stated agreement with plan or made changes to plan which were implemented.    Final Clinical Impression(s) / ED Diagnoses Final diagnoses:  Sickle cell pain crisis  [facility])    Rx / DC Orders ED Discharge Orders     None         Anselmo Pickler, PA-C 04/04/22 AT:[id number]    Veryl Speak, MD 04/04/22 [phone]

## 2022-04-05 DIAGNOSIS — D5700 Hb-SS disease with crisis, unspecified: Secondary | ICD-10-CM | POA: Diagnosis not present

## 2022-04-05 MED ORDER — LIP MEDEX EX OINT
TOPICAL_OINTMENT | CUTANEOUS | Status: DC | PRN
  Filled 2022-04-05: qty 7

## 2022-04-05 MED ORDER — OXYCODONE HCL 5 MG PO TABS
10.0000 mg | ORAL_TABLET | ORAL | Status: DC | PRN
  Administered 2022-04-05 – 2022-04-07 (×8): 10 mg via ORAL
  Filled 2022-04-05 (×8): qty 2

## 2022-04-05 MED ORDER — HYDROMORPHONE 1 MG/ML IV SOLN
INTRAVENOUS | Status: DC
  Administered 2022-04-05: 30 mg via INTRAVENOUS
  Administered 2022-04-05: 4 mg via INTRAVENOUS
  Administered 2022-04-05: 10.5 mg via INTRAVENOUS
  Administered 2022-04-06: 9.5 mg via INTRAVENOUS
  Administered 2022-04-06: 1 mg via INTRAVENOUS
  Administered 2022-04-06: 3.5 mg via INTRAVENOUS
  Filled 2022-04-05: qty 30

## 2022-04-05 NOTE — Progress Notes (Signed)
Mobility Specialist Cancellation/Refusal Note:   Pt declined mobility twice today due to not getting any sleep last night. Requested that I come back tomorrow morning. Will check back as schedule permits.      [facility]

## 2022-04-05 NOTE — Progress Notes (Signed)
Subjective: Lindsey [name] is a 51 year old female with a medical history significant for sickle cell disease, chronic pain syndrome, opiate dependence and tolerance that was admitted for sickle cell pain crisis.   Patient is complaining of pain primarily to low back and lower extremities. Pain intensity is 6/10. Patient denies headache, chest pain, shortness of breath, urinary symptoms, nausea, vomiting, or diarrhea.   Objective:  Vital signs in last 24 hours:  Vitals:   04/05/22 0821 04/05/22 1046 04/05/22 1233 04/05/22 1438  BP:  106/63  (!) 118/59  Pulse:  69  71  Resp: 16 14 16 15   Temp:  99 F (37.2 C)  98.9 F (37.2 C)  TempSrc:  Oral  Oral  SpO2: 97% 100% 100% 100%  Weight:      Height:        Intake/Output from previous day:   Intake/Output Summary (Last 24 hours) at 04/05/2022 1525 Last data filed at 04/05/2022 0900 Gross per 24 hour  Intake 120 ml  Output --  Net 120 ml    Physical Exam: General: Alert, awake, oriented x3, in no acute distress.  HEENT: [place]/AT PEERL, EOMI Neck: Trachea midline,  no masses, no thyromegal,y no JVD, no carotid bruit OROPHARYNX:  Moist, No exudate/ erythema/lesions.  Heart: Regular rate and rhythm, without murmurs, rubs, gallops, PMI non-displaced, no heaves or thrills on palpation.  Lungs: Clear to auscultation, no wheezing or rhonchi noted. No increased vocal fremitus resonant to percussion  Abdomen: Soft, nontender, nondistended, positive bowel sounds, no masses no hepatosplenomegaly noted..  Neuro: No focal neurological deficits noted cranial nerves II through XII grossly intact. DTRs 2+ bilaterally upper and lower extremities. Strength 5 out of 5 in bilateral upper and lower extremities. Musculoskeletal: No warm swelling or erythema around joints, no spinal tenderness noted. Psychiatric: Patient alert and oriented x3, good insight and cognition, good recent to remote recall. Lymph node survey: No cervical axillary or inguinal  lymphadenopathy noted.  Lab Results:  Basic Metabolic Panel:    Component Value Date/Time   NA 138 04/04/2022 0505   NA 142 05/20/2021 1101   K 3.8 04/04/2022 0505   CL 106 04/04/2022 0505   CO2 24 04/04/2022 0505   BUN 7 04/04/2022 0505   BUN 10 05/20/2021 1101   CREATININE 0.64 04/04/2022 0505   GLUCOSE 111 (H) 04/04/2022 0505   CALCIUM 8.9 04/04/2022 0505   CBC:    Component Value Date/Time   WBC 13.3 (H) 04/04/2022 0859   HGB 9.4 (L) 04/04/2022 0859   HGB 11.0 (L) 05/20/2021 1101   HCT 25.4 (L) 04/04/2022 0859   HCT 31.3 (L) 05/20/2021 1101   PLT 399 04/04/2022 0859   PLT 509 (H) 05/20/2021 1101   MCV 85.5 04/04/2022 0859   MCV 88 05/20/2021 1101   NEUTROABS 10.8 (H) 04/04/2022 0505   NEUTROABS 5.4 05/20/2021 1101   LYMPHSABS 1.5 04/04/2022 0505   LYMPHSABS 2.5 05/20/2021 1101   MONOABS 0.9 04/04/2022 0505   EOSABS 0.1 04/04/2022 0505   EOSABS 0.3 05/20/2021 1101   BASOSABS 0.1 04/04/2022 0505   BASOSABS 0.1 05/20/2021 1101    No results found for this or any previous visit (from the past 240 hour(s)).  Studies/Results: DG Chest 2 View  Result Date: 04/04/2022 CLINICAL DATA:  Chest pain, history of sickle cell disease EXAM: CHEST - 2 VIEW COMPARISON:  03/25/2022 FINDINGS: Cardiac shadow is within normal limits. The lungs are clear bilaterally. No focal infiltrate or effusion is seen. Changes in  the thoracic spine consistent with the given clinical history are noted. 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 IMPRESSION: No acute abnormality noted. Bony sequelae of known sickle cell disease. Electronically Signed   By: Alcide Clever M.D.   On: 04/04/2022 03:58    Medications: Scheduled Meds:  enoxaparin (LOVENOX) injection  40 mg Subcutaneous Q24H   folic acid  1 mg Oral Daily   gabapentin  400 mg Oral TID   HYDROmorphone   Intravenous Q4H   ketorolac  15 mg Intravenous Q6H   oxyCODONE  10 mg Oral Q12H   senna-docusate  1  tablet Oral BID   Continuous Infusions:  sodium chloride 75 mL/hr at 04/05/22 1141   PRN Meds:.diphenhydrAMINE, naloxone **AND** sodium chloride flush, ondansetron, ondansetron (ZOFRAN) IV, oxyCODONE, polyethylene glycol  Consultants: None  Procedures: None  Antibiotics: None  Assessment/Plan: Principal Problem:   Sickle cell pain crisis (HCC) Active Problems:   Chronic pain   Anemia of chronic disease   GAD (generalized anxiety disorder)  Sickle cell disease with pain crisis:  Weaning IV dilaudid PCA Decrease IV fluids to [facility] Oxycodone 10 mg every 6 hours as needed for severe breakthrough pain OxyContin 10 mg every 12 hours Toradol 15 mg every 6 hours Monitor vital signs very closely, reevaluate pain scale regularly, and supplemental oxygen as needed  Anemia of chronic disease: Hemoglobin is stable and consistent with patient's baseline.  There is no clinical indication for blood transfusion at this time.  Monitor closely.  Chronic pain syndrome: Continue home medications   Code Status: Full Code Family Communication: N/A Disposition Plan: Not yet ready for discharge.  Discharge planned for 04/06/2022  [name]  APRN, MSN, FNP-C Patient Care [facility] [address] [place], [place] [address] [phone]  If 7PM-7AM, please contact night-coverage.  04/05/2022, 3:25 PM  LOS: 1 day

## 2022-04-06 DIAGNOSIS — D5700 Hb-SS disease with crisis, unspecified: Secondary | ICD-10-CM | POA: Diagnosis not present

## 2022-04-06 MED ORDER — HYDROMORPHONE HCL 2 MG/ML IJ SOLN
2.0000 mg | Freq: Four times a day (QID) | INTRAMUSCULAR | Status: DC | PRN
  Administered 2022-04-06 – 2022-04-07 (×4): 2 mg via INTRAVENOUS
  Filled 2022-04-06 (×5): qty 1

## 2022-04-06 NOTE — Plan of Care (Signed)

## 2022-04-06 NOTE — Progress Notes (Signed)
Subjective: Lindsey [name] is a 51 year old female with a medical history significant for sickle cell disease, chronic pain syndrome, opiate dependence and tolerance that was admitted for sickle cell pain crisis.   Patient has no new complaints on today.  Pain intensity continues to improve.  Pain is mostly to low back and lower extremities.  Pain intensity is 6/10. Patient denies headache, chest pain, shortness of breath, urinary symptoms, nausea, vomiting, or diarrhea.   Objective:  Vital signs in last 24 hours:  Vitals:   04/06/22 0753 04/06/22 1003 04/06/22 1244 04/06/22 1309  BP:  (!) 96/55  114/66  Pulse:  64  73  Resp: 16 18 16 17   Temp:  98.3 F (36.8 C)  99.7 F (37.6 C)  TempSrc:  Oral  Oral  SpO2: 98% 99% 100% 100%  Weight:      Height:        Intake/Output from previous day:   Intake/Output Summary (Last 24 hours) at 04/06/2022 1507 Last data filed at 04/06/2022 0309 Gross per 24 hour  Intake 2591 ml  Output --  Net 2591 ml    Physical Exam: General: Alert, awake, oriented x3, in no acute distress.  HEENT: [place]/AT PEERL, EOMI Neck: Trachea midline,  no masses, no thyromegal,y no JVD, no carotid bruit OROPHARYNX:  Moist, No exudate/ erythema/lesions.  Heart: Regular rate and rhythm, without murmurs, rubs, gallops, PMI non-displaced, no heaves or thrills on palpation.  Lungs: Clear to auscultation, no wheezing or rhonchi noted. No increased vocal fremitus resonant to percussion  Abdomen: Soft, nontender, nondistended, positive bowel sounds, no masses no hepatosplenomegaly noted..  Neuro: No focal neurological deficits noted cranial nerves II through XII grossly intact. DTRs 2+ bilaterally upper and lower extremities. Strength 5 out of 5 in bilateral upper and lower extremities. Musculoskeletal: No warm swelling or erythema around joints, no spinal tenderness noted. Psychiatric: Patient alert and oriented x3, good insight and cognition, good recent to remote  recall. Lymph node survey: No cervical axillary or inguinal lymphadenopathy noted.  Lab Results:  Basic Metabolic Panel:    Component Value Date/Time   NA 138 04/04/2022 0505   NA 142 05/20/2021 1101   K 3.8 04/04/2022 0505   CL 106 04/04/2022 0505   CO2 24 04/04/2022 0505   BUN 7 04/04/2022 0505   BUN 10 05/20/2021 1101   CREATININE 0.64 04/04/2022 0505   GLUCOSE 111 (H) 04/04/2022 0505   CALCIUM 8.9 04/04/2022 0505   CBC:    Component Value Date/Time   WBC 13.3 (H) 04/04/2022 0859   HGB 9.4 (L) 04/04/2022 0859   HGB 11.0 (L) 05/20/2021 1101   HCT 25.4 (L) 04/04/2022 0859   HCT 31.3 (L) 05/20/2021 1101   PLT 399 04/04/2022 0859   PLT 509 (H) 05/20/2021 1101   MCV 85.5 04/04/2022 0859   MCV 88 05/20/2021 1101   NEUTROABS 10.8 (H) 04/04/2022 0505   NEUTROABS 5.4 05/20/2021 1101   LYMPHSABS 1.5 04/04/2022 0505   LYMPHSABS 2.5 05/20/2021 1101   MONOABS 0.9 04/04/2022 0505   EOSABS 0.1 04/04/2022 0505   EOSABS 0.3 05/20/2021 1101   BASOSABS 0.1 04/04/2022 0505   BASOSABS 0.1 05/20/2021 1101    No results found for this or any previous visit (from the past 240 hour(s)).  Studies/Results: No results found.  Medications: Scheduled Meds:  enoxaparin (LOVENOX) injection  40 mg Subcutaneous Q24H   folic acid  1 mg Oral Daily   gabapentin  400 mg Oral TID  HYDROmorphone   Intravenous Q4H   ketorolac  15 mg Intravenous Q6H   oxyCODONE  10 mg Oral Q12H   senna-docusate  1 tablet Oral BID   Continuous Infusions:  sodium chloride 10 mL/hr at 04/06/22 0309   PRN Meds:.diphenhydrAMINE, lip balm, naloxone **AND** sodium chloride flush, ondansetron, ondansetron (ZOFRAN) IV, oxyCODONE, polyethylene glycol  Consultants: None  Procedures: None  Antibiotics: None  Assessment/Plan: Principal Problem:   Sickle cell pain crisis (HCC) Active Problems:   Chronic pain   Anemia of chronic disease   GAD (generalized anxiety disorder)  Sickle cell disease with pain  crisis:  Discontinue IV Dilaudid PCA.  Dilaudid 2 mg every 4 hours as needed for severe breakthrough pain Decrease IV fluids to [facility] Oxycodone 10 mg every 6 hours as needed for severe breakthrough pain OxyContin 10 mg every 12 hours Toradol 15 mg every 6 hours Monitor vital signs very closely, reevaluate pain scale regularly, and supplemental oxygen as needed Patient encouraged to ambulate in halls in preparation for discharge on 04/07/2022.   Anemia of chronic disease: Hemoglobin is stable and consistent with patient's baseline.  There is no clinical indication for blood transfusion at this time.  Monitor closely.  Chronic pain syndrome: Continue home medications   Code Status: Full Code Family Communication: N/A Disposition Plan: Not yet ready for discharge.  Discharge planned for 04/07/2022  [name]  APRN, MSN, FNP-C Patient Care [facility] [address] [place], [place] [address] [phone]  If 7PM-7AM, please contact night-coverage.  04/06/2022, 3:07 PM  LOS: 2 days

## 2022-04-06 NOTE — Progress Notes (Signed)
Mobility Specialist - Progress Note   04/06/22 1459  Oxygen Therapy  O2 Device Nasal Cannula  O2 Flow Rate (L/min) 1 L/min  Mobility  Activity Ambulated with assistance in hallway  Level of Assistance Independent after set-up  Assistive Device  (IV Pole)  Distance Ambulated (ft) 500 ft  Activity Response Tolerated well  Mobility Referral Yes  $Mobility charge 1 Mobility   Pt received in bed and agreeable to mobility. No complaints during mobility. Pt to bed after session with all needs met.     [facility]

## 2022-04-07 DIAGNOSIS — D5700 Hb-SS disease with crisis, unspecified: Secondary | ICD-10-CM | POA: Diagnosis not present

## 2022-04-07 MED ORDER — GABAPENTIN 400 MG PO CAPS: 400.0000 mg | ORAL_CAPSULE | Freq: Three times a day (TID) | ORAL | 5 refills | Status: DC

## 2022-04-07 MED ORDER — OXYCODONE HCL 10 MG PO TABS: 10.0000 mg | ORAL_TABLET | Freq: Four times a day (QID) | ORAL | 0 refills | Status: DC | PRN

## 2022-04-09 NOTE — Discharge Summary (Addendum)
Physician Discharge Summary  Lindsey [name]:[id number] DOB: 1970/12/24 DOA: 04/04/2022  PCP: Lindsey Maroon, FNP  Admit date: 04/04/2022  Discharge date: 04/07/2022  Discharge Diagnoses:  Principal Problem:   Sickle cell pain crisis (HCC) Active Problems:   Chronic pain   Anemia of chronic disease   GAD (generalized anxiety disorder)   Discharge Condition: Stable  Disposition:   Follow-up Information     Lindsey Maroon, FNP Follow up.   Specialty: Family Medicine Contact information: [address] [place] [address] [phone]                Pt is discharged home in good condition and is to follow up with Lindsey Maroon, FNP this week to have labs evaluated. Lindsey [name] is instructed to increase activity slowly and balance with rest for the next few days, and use prescribed medication to complete treatment of pain  Diet: Regular Wt Readings from Last 3 Encounters:  04/04/22 61.2 kg  03/28/22 66.7 kg  02/11/22 63.5 kg    History of present illness:  Lindsey [name]  is a 51 y.o. female with a medical history significant for sickle cell disease, chronic pain syndrome, opiate dependence and tolerance, frequent hospitalizations, and history of anemia of chronic disease presents to the emergency department with complaints of low back and lower extremity pain over the past several days.  Patient also endorses chest pain.  Patient states that pain intensity has been elevated for 4 days and unrelieved by her home medications.  Her home medications include OxyContin, oxycodone, and gabapentin.  Patient attributes pain crisis to changes in weather and increase stressors during the recent holiday.  Patient rates pain as 9/10, constant, and throbbing.  Chest pain primarily central, nonradiating.  Patient has not had any recent falls or injuries.  No headache, fever, chills, shortness of breath, or chest pain.  No urinary symptoms, nausea,  vomiting, or diarrhea. No sick contacts, recent travel, or known exposure to COVID-19.   ER course: While in ER, vital signs remained stable.  Recorded as:BP 108/64 (BP Location: Left Arm)   Pulse 62   Temp 98.2 F (36.8 C) (Oral)   Resp 20   Ht 5' (1.524 m)   Wt 61.2 kg   LMP 07/09/2021 (Approximate)   SpO2 96%   BMI 26.37 kg/m  Complete blood count notable for WBCs 13.4, hemoglobin 9.8 g/dL, and platelets 540,086.  Basic metabolic panel unremarkable.  Troponin negative.  Reticulocytes 4.1%.  Chest x-ray shows no acute cardiopulmonary process.  Patient's pain persists despite IV Dilaudid, IV fluids, and IV Toradol.  Patient admitted for further management of sickle cell pain crisis  Hospital Course:  Sickle cell pain crisis: Patient was admitted for sickle cell pain crisis and managed appropriately with IVF, IV Dilaudid via PCA and IV Toradol, as well as other adjunct therapies per sickle cell pain management protocols. IV Dilaudid PCA was weaned appropriately and patient transition to her home medication.  Oxycodone 10 mg #16 was sent to patient's pharmacy.  PDMP reviewed prior to prescribing opiate medications, no inconsistencies were noted. Discussed the addition of disease modifying agents to patient's current medication regimen was discussed at length.  Patient expressed understanding.  She will schedule first available appointment for hospital follow-up and medication management.  Patient also interested and starting Suboxone therapy. Patient was therefore discharged home today in a hemodynamically stable condition.   Lindsey [name] will follow-up with PCP within 1 week of this  discharge. Lindsey [name] was counseled extensively about nonpharmacologic means of pain management, patient verbalized understanding and was appreciative of  the care received during this admission.   We discussed the need for good hydration, monitoring of hydration status, avoidance of heat, cold, stress, and infection  triggers. We discussed the need to be adherent with taking home medications. Patient was reminded of the need to seek medical attention immediately if any symptom of bleeding, anemia, or infection occurs.  Discharge Exam: Vitals:   04/07/22 0257 04/07/22 0959  BP: 118/81 114/69  Pulse: 63 65  Resp: 18 16  Temp: 98.7 F (37.1 C) 98.5 F (36.9 C)  SpO2: 99% 98%   Vitals:   04/06/22 1244 04/06/22 1309 04/07/22 0257 04/07/22 0959  BP:  114/66 118/81 114/69  Pulse:  73 63 65  Resp: 16 17 18 16   Temp:  99.7 F (37.6 C) 98.7 F (37.1 C) 98.5 F (36.9 C)  TempSrc:  Oral Oral Oral  SpO2: 100% 100% 99% 98%  Weight:      Height:        General appearance : Awake, alert, not in any distress. Speech Clear. Not toxic looking HEENT: Atraumatic and Normocephalic, pupils equally reactive to light and accomodation Neck: Supple, no JVD. No cervical lymphadenopathy.  Chest: Good air entry bilaterally, no added sounds  CVS: S1 S2 regular, no murmurs.  Abdomen: Bowel sounds present, Non tender and not distended with no gaurding, rigidity or rebound. Extremities: B/L Lower Ext shows no edema, both legs are warm to touch Neurology: Awake alert, and oriented X 3, CN II-XII intact, Non focal Skin: No Rash  Discharge Instructions  Discharge Instructions     Discharge patient   Complete by: As directed    Discharge disposition: 01-Home or Self Care   Discharge patient date: 04/07/2022      Allergies as of 04/07/2022       Reactions   Ketamine Anxiety, Other (See Comments)   Tachycardia        Medication List     TAKE these medications    ALPRAZolam 0.25 MG tablet Commonly known as: XANAX Take 1 tablet (0.25 mg total) by mouth 2 (two) times daily as needed for anxiety.   Benadryl Allergy 25 MG tablet Generic drug: diphenhydrAMINE Take 50 mg by mouth at bedtime as needed for sleep.   carboxymethylcellulose 0.5 % Soln Commonly known as: REFRESH PLUS Place 1 drop into both  eyes daily as needed (dry eyes).   DULoxetine 20 MG capsule Commonly known as: Cymbalta Take 1 capsule (20 mg total) by mouth daily.   folic acid 1 MG tablet Commonly known as: FOLVITE Take 1 tablet (1 mg total) by mouth daily.   gabapentin 400 MG capsule Commonly known as: NEURONTIN Take 1 capsule (400 mg total) by mouth 3 (three) times daily.   ibuprofen 200 MG tablet Commonly known as: ADVIL Take 400 mg by mouth every 6 (six) hours as needed for mild pain.   oxyCODONE 10 mg 12 hr tablet Commonly known as: OXYCONTIN Take 1 tablet (10 mg total) by mouth every 12 (twelve) hours. What changed: Another medication with the same name was changed. Make sure you understand how and when to take each.   Oxycodone HCl 10 MG Tabs Take 1 tablet (10 mg total) by mouth every 6 (six) hours as needed for up to 15 days (for moderate to severe pain). Start taking on: April 12, 2022 What changed: These instructions start on April 12, 2022.  If you are unsure what to do until then, ask your doctor or other care provider.        The results of significant diagnostics from this hospitalization (including imaging, microbiology, ancillary and laboratory) are listed below for reference.    Significant Diagnostic Studies: DG Chest 2 View  Result Date: 04/04/2022 CLINICAL DATA:  Chest pain, history of sickle cell disease EXAM: CHEST - 2 VIEW COMPARISON:  03/25/2022 FINDINGS: Cardiac shadow is within normal limits. The lungs are clear bilaterally. No focal infiltrate or effusion is seen. Changes in the thoracic spine consistent with the given clinical history are noted. 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 IMPRESSION: No acute abnormality noted. Bony sequelae of known sickle cell disease. Electronically Signed   By: Alcide Clever M.D.   On: 04/04/2022 03:58   DG Chest Port 1 View  Result Date: 03/25/2022 CLINICAL DATA:  [id number] Sickle cell pain crisis (HCC)  [id number] EXAM: PORTABLE CHEST 1 VIEW COMPARISON:  None Available. FINDINGS: Lungs are well expanded, symmetric, and clear. No pneumothorax or pleural effusion. Cardiac size within normal limits. Pulmonary vascularity is normal. Sclerotic changes within the humeral heads bilaterally likely relate to avascular necrosis. No acute bone abnormality. IMPRESSION: 1. No active disease. 2. Bilateral humeral head avascular necrosis. Electronically Signed   By: Helyn Numbers M.D.   On: 03/25/2022 01:23    Microbiology: No results found for this or any previous visit (from the past 240 hour(s)).   Labs: Basic Metabolic Panel: Recent Labs  Lab 04/04/22 0505  NA 138  K 3.8  CL 106  CO2 24  GLUCOSE 111*  BUN 7  CREATININE 0.64  CALCIUM 8.9   Liver Function Tests: No results for input(s): "AST", "ALT", "ALKPHOS", "BILITOT", "PROT", "ALBUMIN" in the last 168 hours. No results for input(s): "LIPASE", "AMYLASE" in the last 168 hours. No results for input(s): "AMMONIA" in the last 168 hours. CBC: Recent Labs  Lab 04/04/22 0505 04/04/22 0859  WBC 13.4* 13.3*  NEUTROABS 10.8*  --   HGB 9.8* 9.4*  HCT 26.2* 25.4*  MCV 85.3 85.5  PLT 392 399   Cardiac Enzymes: No results for input(s): "CKTOTAL", "CKMB", "CKMBINDEX", "TROPONINI" in the last 168 hours. BNP: Invalid input(s): "POCBNP" CBG: No results for input(s): "GLUCAP" in the last 168 hours.  Time coordinating discharge: 30 minutes  Signed:  [name]  APRN, MSN, FNP-C Patient Care [facility] [address] [place], [place] [address] [phone]  [facility] 04/09/2022, 10:10 AM   Evaluation and management procedures were performed by the Advanced Practitioner under my supervision and collaboration. I have reviewed the Advanced Practitioner's note and chart, and I agree with the management and plan.   Jeanann Lewandowsky, MD, MHA, CPE, Sherle Poe [facility] Patient [facility] [place], [place] [phone] 04/09/2022, 11:21 AM

## 2022-04-18 ENCOUNTER — Other Ambulatory Visit: Payer: Self-pay | Admitting: Family Medicine

## 2022-04-18 DIAGNOSIS — G894 Chronic pain syndrome: Secondary | ICD-10-CM

## 2022-04-18 MED ORDER — OXYCODONE HCL ER 10 MG PO T12A: 10.0000 mg | EXTENDED_RELEASE_TABLET | Freq: Two times a day (BID) | ORAL | 0 refills | Status: DC

## 2022-04-18 NOTE — Telephone Encounter (Signed)
From: [name] To: Office of Julianne Handler, [place] Sent: 04/17/2022 11:59 AM EST Subject: Medication Renewal Request  Refills have been requested for the following medications:   oxyCODONE (OXYCONTIN) 10 mg 12 hr tablet [Amika Tassin]  Preferred pharmacy: [id number] - [place], [place] - [address] AT [facility] OF [address] Delivery method: Daryll Drown

## 2022-04-18 NOTE — Telephone Encounter (Signed)
Reviewed PDMP substance reporting system prior to prescribing opiate medications. No inconsistencies noted.  Meds ordered this encounter  Medications   oxyCODONE (OXYCONTIN) 10 mg 12 hr tablet    Sig: Take 1 tablet (10 mg total) by mouth every 12 (twelve) hours.    Dispense:  60 tablet    Refill:  0    Order Specific Question:   Supervising Provider    Answer:   [name], [name]      Jacques Fife Moore Rainn Zupko  APRN, MSN, FNP-C Patient [facility] [address]  [place], [place] [address] [phone]  

## 2022-04-25 ENCOUNTER — Other Ambulatory Visit: Payer: Self-pay | Admitting: Family Medicine

## 2022-04-25 ENCOUNTER — Telehealth: Payer: Self-pay | Admitting: Family Medicine

## 2022-04-25 DIAGNOSIS — G894 Chronic pain syndrome: Secondary | ICD-10-CM

## 2022-04-25 DIAGNOSIS — D5700 Hb-SS disease with crisis, unspecified: Secondary | ICD-10-CM

## 2022-04-25 MED ORDER — OXYCODONE HCL 10 MG PO TABS: 10.0000 mg | ORAL_TABLET | Freq: Four times a day (QID) | ORAL | 0 refills | Status: DC | PRN

## 2022-04-25 NOTE — Telephone Encounter (Signed)
Reviewed PDMP substance reporting system prior to prescribing opiate medications. No inconsistencies noted.  Meds ordered this encounter  Medications   Oxycodone HCl 10 MG TABS    Sig: Take 1 tablet (10 mg total) by mouth every 6 (six) hours as needed for up to 15 days (for moderate to severe pain).    Dispense:  60 tablet    Refill:  0    Order Specific Question:   Supervising Provider    Answer:   Tresa Garter [id number]   [name]  APRN, MSN, FNP-C Patient [place] [address] [place], [place] [address] [phone]

## 2022-04-25 NOTE — Telephone Encounter (Signed)
Oxycodone 10 mg refill request  

## 2022-04-25 NOTE — Telephone Encounter (Signed)
From: [name] To: Office of Julianne Handler, [place] Sent: 04/25/2022 11:43 AM EST Subject: Medication Renewal Request  Refills have been requested for the following medications:   Oxycodone HCl 10 MG TABS [Lindsey [name]]  Preferred pharmacy: [facility] #[id number] - [place], [place] - [address] AT [facility] OF [address] Delivery method: Daryll Drown

## 2022-05-03 ENCOUNTER — Other Ambulatory Visit: Payer: Self-pay

## 2022-05-03 ENCOUNTER — Encounter (HOSPITAL_COMMUNITY): Payer: Self-pay | Admitting: *Deleted

## 2022-05-03 ENCOUNTER — Inpatient Hospital Stay (HOSPITAL_COMMUNITY)
Admission: EM | Admit: 2022-05-03 | Discharge: 2022-05-07 | DRG: 812 | Disposition: A | Discharge status: Home / Self Care | Payer: Medicaid Other | Attending: Internal Medicine | Admitting: Internal Medicine

## 2022-05-03 DIAGNOSIS — Z79899 Other long term (current) drug therapy: Secondary | ICD-10-CM

## 2022-05-03 DIAGNOSIS — R001 Bradycardia, unspecified: Secondary | ICD-10-CM | POA: Diagnosis present

## 2022-05-03 DIAGNOSIS — G8929 Other chronic pain: Secondary | ICD-10-CM | POA: Diagnosis present

## 2022-05-03 DIAGNOSIS — F411 Generalized anxiety disorder: Secondary | ICD-10-CM | POA: Diagnosis present

## 2022-05-03 DIAGNOSIS — Z888 Allergy status to other drugs, medicaments and biological substances status: Secondary | ICD-10-CM

## 2022-05-03 DIAGNOSIS — D638 Anemia in other chronic diseases classified elsewhere: Secondary | ICD-10-CM | POA: Diagnosis present

## 2022-05-03 DIAGNOSIS — G894 Chronic pain syndrome: Secondary | ICD-10-CM | POA: Diagnosis present

## 2022-05-03 DIAGNOSIS — D5700 Hb-SS disease with crisis, unspecified: Secondary | ICD-10-CM | POA: Diagnosis present

## 2022-05-03 DIAGNOSIS — F17290 Nicotine dependence, other tobacco product, uncomplicated: Secondary | ICD-10-CM | POA: Diagnosis present

## 2022-05-03 DIAGNOSIS — F1120 Opioid dependence, uncomplicated: Secondary | ICD-10-CM | POA: Diagnosis present

## 2022-05-03 LAB — CBC WITH DIFFERENTIAL/PLATELET
Abs Immature Granulocytes: 0.02 10*3/uL (ref 0.00–0.07)
Basophils Absolute: 0.1 10*3/uL (ref 0.0–0.1)
Basophils Relative: 1 %
Eosinophils Absolute: 0.2 10*3/uL (ref 0.0–0.5)
Eosinophils Relative: 2 %
HCT: 30.2 % — ABNORMAL LOW (ref 36.0–46.0)
Hemoglobin: 11.2 g/dL — ABNORMAL LOW (ref 12.0–15.0)
Immature Granulocytes: 0 %
Lymphocytes Relative: 44 %
Lymphs Abs: 4.2 10*3/uL — ABNORMAL HIGH (ref 0.7–4.0)
MCH: 29.9 pg (ref 26.0–34.0)
MCHC: 37.1 g/dL — ABNORMAL HIGH (ref 30.0–36.0)
MCV: 80.7 fL (ref 80.0–100.0)
Monocytes Absolute: 0.5 10*3/uL (ref 0.1–1.0)
Monocytes Relative: 5 %
Neutro Abs: 4.6 10*3/uL (ref 1.7–7.7)
Neutrophils Relative %: 48 %
Platelets: 393 10*3/uL (ref 150–400)
RBC: 3.74 MIL/uL — ABNORMAL LOW (ref 3.87–5.11)
RDW: 16.7 % — ABNORMAL HIGH (ref 11.5–15.5)
WBC: 9.5 10*3/uL (ref 4.0–10.5)
nRBC: 0.4 % — ABNORMAL HIGH (ref 0.0–0.2)

## 2022-05-03 LAB — COMPREHENSIVE METABOLIC PANEL
ALT: 16 U/L (ref 0–44)
AST: 20 U/L (ref 15–41)
Albumin: 4.3 g/dL (ref 3.5–5.0)
Alkaline Phosphatase: 41 U/L (ref 38–126)
Anion gap: 7 (ref 5–15)
BUN: 10 mg/dL (ref 6–20)
CO2: 24 mmol/L (ref 22–32)
Calcium: 9.3 mg/dL (ref 8.9–10.3)
Chloride: 111 mmol/L (ref 98–111)
Creatinine, Ser: 0.81 mg/dL (ref 0.44–1.00)
GFR, Estimated: >60 mL/min (ref >60)
Glucose, Bld: 96 mg/dL (ref 70–99)
Potassium: 3.7 mmol/L (ref 3.5–5.1)
Sodium: 142 mmol/L (ref 135–145)
Total Bilirubin: 1.3 mg/dL — ABNORMAL HIGH (ref 0.3–1.2)
Total Protein: 7.3 g/dL (ref 6.5–8.1)

## 2022-05-03 LAB — RETICULOCYTES
Immature Retic Fract: 26.6 % — ABNORMAL HIGH (ref 2.3–15.9)
RBC.: 3.72 MIL/uL — ABNORMAL LOW (ref 3.87–5.11)
Retic Count, Absolute: 143.2 10*3/uL (ref 19.0–186.0)
Retic Ct Pct: 3.9 % — ABNORMAL HIGH (ref 0.4–3.1)

## 2022-05-03 LAB — MAGNESIUM: Magnesium: 2 mg/dL (ref 1.7–2.4)

## 2022-05-03 LAB — PHOSPHORUS: Phosphorus: 3 mg/dL (ref 2.5–4.6)

## 2022-05-03 LAB — HCG, QUANTITATIVE, PREGNANCY: hCG, Beta Chain, Quant, S: 1 m[IU]/mL (ref <5)

## 2022-05-03 LAB — CK: Total CK: 43 U/L (ref 38–234)

## 2022-05-03 MED ORDER — SODIUM CHLORIDE 0.9% FLUSH: 9.0000 mL | INTRAVENOUS | Status: DC | PRN

## 2022-05-03 MED ORDER — GABAPENTIN 400 MG PO CAPS
400.0000 mg | ORAL_CAPSULE | Freq: Three times a day (TID) | ORAL | Status: DC
  Administered 2022-05-03 – 2022-05-07 (×11): 400 mg via ORAL
  Filled 2022-05-03 (×11): qty 1

## 2022-05-03 MED ORDER — HYDROMORPHONE HCL 2 MG/ML IJ SOLN
2.0000 mg | INTRAMUSCULAR | Status: AC
  Administered 2022-05-03: 2 mg via INTRAVENOUS
  Filled 2022-05-03: qty 1

## 2022-05-03 MED ORDER — ONDANSETRON HCL 4 MG/2ML IJ SOLN: 4.0000 mg | INTRAMUSCULAR | Status: DC | PRN

## 2022-05-03 MED ORDER — ONDANSETRON HCL 4 MG/2ML IJ SOLN
4.0000 mg | INTRAMUSCULAR | Status: DC | PRN
  Administered 2022-05-03: 4 mg via INTRAVENOUS
  Filled 2022-05-03: qty 2

## 2022-05-03 MED ORDER — HYDROMORPHONE 1 MG/ML IV SOLN
INTRAVENOUS | Status: DC
  Administered 2022-05-03 – 2022-05-04 (×2): 30 mg via INTRAVENOUS
  Filled 2022-05-03: qty 30

## 2022-05-03 MED ORDER — OXYCODONE HCL ER 10 MG PO T12A
10.0000 mg | EXTENDED_RELEASE_TABLET | Freq: Two times a day (BID) | ORAL | Status: DC
  Administered 2022-05-03 – 2022-05-07 (×8): 10 mg via ORAL
  Filled 2022-05-03 (×8): qty 1

## 2022-05-03 MED ORDER — ONDANSETRON 4 MG PO TBDP: 4.0000 mg | ORAL_TABLET | Freq: Three times a day (TID) | ORAL | Status: DC | PRN

## 2022-05-03 MED ORDER — SENNOSIDES-DOCUSATE SODIUM 8.6-50 MG PO TABS
1.0000 | ORAL_TABLET | Freq: Two times a day (BID) | ORAL | Status: DC
  Administered 2022-05-03 – 2022-05-06 (×4): 1 via ORAL
  Filled 2022-05-03 (×7): qty 1

## 2022-05-03 MED ORDER — ONDANSETRON HCL 4 MG/2ML IJ SOLN: 4.0000 mg | Freq: Four times a day (QID) | INTRAMUSCULAR | Status: DC | PRN

## 2022-05-03 MED ORDER — DEXTROSE-NACL 5-0.45 % IV SOLN: INTRAVENOUS | Status: DC

## 2022-05-03 MED ORDER — HYDROXYZINE HCL 25 MG PO TABS
25.0000 mg | ORAL_TABLET | ORAL | Status: DC | PRN
  Administered 2022-05-03 – 2022-05-05 (×2): 25 mg via ORAL
  Filled 2022-05-03 (×2): qty 1

## 2022-05-03 MED ORDER — FOLIC ACID 1 MG PO TABS
1.0000 mg | ORAL_TABLET | Freq: Every day | ORAL | Status: DC
  Administered 2022-05-04 – 2022-05-07 (×4): 1 mg via ORAL
  Filled 2022-05-03 (×4): qty 1

## 2022-05-03 MED ORDER — KETOROLAC TROMETHAMINE 15 MG/ML IJ SOLN
15.0000 mg | Freq: Four times a day (QID) | INTRAMUSCULAR | Status: DC
  Administered 2022-05-03 – 2022-05-07 (×14): 15 mg via INTRAVENOUS
  Filled 2022-05-03 (×14): qty 1

## 2022-05-03 MED ORDER — DIPHENHYDRAMINE HCL 25 MG PO CAPS
25.0000 mg | ORAL_CAPSULE | ORAL | Status: DC | PRN
  Administered 2022-05-03: 25 mg via ORAL
  Filled 2022-05-03: qty 1

## 2022-05-03 MED ORDER — NALOXONE HCL 0.4 MG/ML IJ SOLN: 0.4000 mg | INTRAMUSCULAR | Status: DC | PRN

## 2022-05-03 MED ORDER — ENOXAPARIN SODIUM 40 MG/0.4ML IJ SOSY
40.0000 mg | PREFILLED_SYRINGE | INTRAMUSCULAR | Status: DC
  Filled 2022-05-03 (×3): qty 0.4

## 2022-05-03 MED ORDER — ACETAMINOPHEN 325 MG PO TABS
650.0000 mg | ORAL_TABLET | Freq: Four times a day (QID) | ORAL | Status: AC | PRN
  Administered 2022-05-03 – 2022-05-06 (×2): 650 mg via ORAL
  Filled 2022-05-03 (×2): qty 2

## 2022-05-03 MED ORDER — KETOROLAC TROMETHAMINE 15 MG/ML IJ SOLN
15.0000 mg | INTRAMUSCULAR | Status: AC
  Administered 2022-05-03: 15 mg via INTRAVENOUS
  Filled 2022-05-03: qty 1

## 2022-05-03 MED ORDER — ONDANSETRON HCL 4 MG PO TABS: 4.0000 mg | ORAL_TABLET | ORAL | Status: DC | PRN

## 2022-05-03 MED ORDER — OXYCODONE HCL 5 MG PO TABS
10.0000 mg | ORAL_TABLET | Freq: Once | ORAL | Status: AC
  Administered 2022-05-03: 10 mg via ORAL
  Filled 2022-05-03: qty 2

## 2022-05-03 MED ORDER — DIPHENHYDRAMINE HCL 25 MG PO CAPS: 25.0000 mg | ORAL_CAPSULE | ORAL | Status: DC | PRN

## 2022-05-03 NOTE — ED Notes (Signed)
Hospitalist at bedside. Will transport after she completes her assessment.

## 2022-05-03 NOTE — ED Provider Notes (Signed)
[facility]-EMERGENCY DEPT Provider Note   CSN: [id number] Arrival date & time: 05/03/22  1152     History  Chief Complaint  Patient presents with   Sickle Cell Pain Crisis    Lindsey [name] is a 51 y.o. female.  Patient with a history of sickle cell disease, opiate dependence, chronic pain presenting with typical sickle cell pain involving her low back and bilateral legs.  Symptoms worsening since yesterday.  Denies any fall or trauma.  Pain is similar to previous sickle cell exacerbations but not controlled by her home oxycodone.  Denies any change in her chronic pain pattern.  No new weakness, numbness or tingling.  No fever, chest pain or shortness of breath.  No bowel or bladder incontinence.  No fever.  No pain with urination or blood in the urine.  Denies any history of previous back surgery.  The history is provided by the patient and the spouse.  Sickle Cell Pain Crisis Associated symptoms: no chest pain, no congestion, no cough, no fever, no headaches, no nausea and no shortness of breath        Home Medications Prior to Admission medications   Medication Sig Start Date End Date Taking? Authorizing Provider  ALPRAZolam (XANAX) 0.25 MG tablet Take 1 tablet (0.25 mg total) by mouth 2 (two) times daily as needed for anxiety. Patient not taking: Reported on 03/25/2022 12/31/21   Rometta Emery, MD  carboxymethylcellulose (REFRESH PLUS) 0.5 % SOLN Place 1 drop into both eyes daily as needed (dry eyes).    [provider]  diphenhydrAMINE (BENADRYL ALLERGY) 25 MG tablet Take 50 mg by mouth at bedtime as needed for sleep.    [provider]  DULoxetine (CYMBALTA) 20 MG capsule Take 1 capsule (20 mg total) by mouth daily. Patient not taking: Reported on 03/25/2022 12/31/21 12/31/22  Rometta Emery, MD  folic acid (FOLVITE) 1 MG tablet Take 1 tablet (1 mg total) by mouth daily. 12/31/21 12/31/22  Rometta Emery, MD  gabapentin  (NEURONTIN) 400 MG capsule Take 1 capsule (400 mg total) by mouth 3 (three) times daily. 04/07/22   Massie Maroon, FNP  ibuprofen (ADVIL) 200 MG tablet Take 400 mg by mouth every 6 (six) hours as needed for mild pain.    [provider]  oxyCODONE (OXYCONTIN) 10 mg 12 hr tablet Take 1 tablet (10 mg total) by mouth every 12 (twelve) hours. 04/18/22   Massie Maroon, FNP  Oxycodone HCl 10 MG TABS Take 1 tablet (10 mg total) by mouth every 6 (six) hours as needed for up to 15 days (for moderate to severe pain). 04/26/22 05/11/22  Massie Maroon, FNP      Allergies    Ketamine    Review of Systems   Review of Systems  Constitutional:  Negative for activity change, appetite change and fever.  HENT:  Negative for congestion.   Respiratory:  Negative for cough, chest tightness and shortness of breath.   Cardiovascular:  Negative for chest pain.  Gastrointestinal:  Negative for abdominal pain and nausea.  Genitourinary:  Negative for dysuria and hematuria.  Musculoskeletal:  Positive for arthralgias, back pain and myalgias.  Skin:  Negative for rash.  Neurological:  Negative for dizziness, weakness and headaches.    all other systems are negative except as noted in the HPI and PMH.   Physical Exam Updated Vital Signs BP 137/75 (BP Location: Left Arm)   Pulse 98   Temp 98.5  F (36.9 C) (Oral)   LMP 07/09/2021 (Approximate)   SpO2 99%  Physical Exam Vitals and nursing note reviewed.  Constitutional:      General: She is not in acute distress.    Appearance: She is well-developed.     Comments: Uncomfortable, tearful  HENT:     Head: Normocephalic and atraumatic.     Mouth/Throat:     Pharynx: No oropharyngeal exudate.  Eyes:     Conjunctiva/sclera: Conjunctivae normal.     Pupils: Pupils are equal, round, and reactive to light.  Neck:     Comments: No meningismus. Cardiovascular:     Rate and Rhythm: Normal rate and regular rhythm.     Heart sounds: Normal heart  sounds. No murmur heard. Pulmonary:     Effort: Pulmonary effort is normal. No respiratory distress.     Breath sounds: Normal breath sounds.  Abdominal:     Palpations: Abdomen is soft.     Tenderness: There is no abdominal tenderness. There is no guarding or rebound.  Musculoskeletal:        General: Tenderness present. Normal range of motion.     Cervical back: Normal range of motion and neck supple.     Comments: Diffuse paraspinal lumbar tenderness, no midline tenderness  5/5 strength in bilateral lower extremities. Ankle plantar and dorsiflexion intact. Great toe extension intact bilaterally. +2 DP and PT pulses.    Skin:    General: Skin is warm.  Neurological:     Mental Status: She is alert and oriented to person, place, and time.     Cranial Nerves: No cranial nerve deficit.     Motor: No abnormal muscle tone.     Coordination: Coordination normal.     Comments:  5/5 strength throughout. CN 2-12 intact.Equal grip strength.   Psychiatric:        Behavior: Behavior normal.     ED Results / Procedures / Treatments   Labs (all labs ordered are listed, but only abnormal results are displayed) Labs Reviewed  RETICULOCYTES - Abnormal; Notable for the following components:      Result Value   Retic Ct Pct 3.9 (*)    RBC. 3.72 (*)    Immature Retic Fract 26.6 (*)    All other components within normal limits  COMPREHENSIVE METABOLIC PANEL - Abnormal; Notable for the following components:   Total Bilirubin 1.3 (*)    All other components within normal limits  CBC WITH DIFFERENTIAL/PLATELET - Abnormal; Notable for the following components:   RBC 3.74 (*)    Hemoglobin 11.2 (*)    HCT 30.2 (*)    MCHC 37.1 (*)    RDW 16.7 (*)    nRBC 0.4 (*)    Lymphs Abs 4.2 (*)    All other components within normal limits  HCG, QUANTITATIVE, PREGNANCY    EKG None  Radiology No results found.  Procedures Procedures    Medications Ordered in ED Medications  ondansetron  (ZOFRAN-ODT) disintegrating tablet 4 mg (has no administration in time range)  dextrose 5 %-0.45 % sodium chloride infusion (has no administration in time range)  ketorolac (TORADOL) 15 MG/ML injection 15 mg (has no administration in time range)  HYDROmorphone (DILAUDID) injection 2 mg (has no administration in time range)  HYDROmorphone (DILAUDID) injection 2 mg (has no administration in time range)  HYDROmorphone (DILAUDID) injection 2 mg (has no administration in time range)  diphenhydrAMINE (BENADRYL) capsule 25-50 mg (has no administration in time range)  ondansetron (ZOFRAN)  injection 4 mg (has no administration in time range)  oxyCODONE (Oxy IR/ROXICODONE) immediate release tablet 10 mg (10 mg Oral Given 05/03/22 1438)    ED Course/ Medical Decision Making/ A&P                           Medical Decision Making Amount and/or Complexity of Data Reviewed Labs: ordered. Decision-making details documented in ED Course. Radiology: ordered and independent interpretation performed. Decision-making details documented in ED Course. ECG/medicine tests: ordered and independent interpretation performed. Decision-making details documented in ED Course.  Risk Prescription drug management. Decision regarding hospitalization.  Typical sickle cell pain involving low back and lower extremities.  Vital stable, no distress.  No fever.  Low suspicion for cord compression or cauda equina.  Given IV fluids, pain and nausea medications per protocol.  Labs are reassuring with stable hemoglobin.  Reticulocyte count is adequate.  Pain remains uncontrolled despite multiple doses of IV pain medication.  She is requesting admission to the hospital.  Discussed with sickle cell service Dr. Hyman Hopes who is leaving for the day and request hospitalist admission.  Awaiting hospitalist call back at shift change.          Final Clinical Impression(s) / ED Diagnoses Final diagnoses:  Sickle cell pain  crisis [facility])    Rx / DC Orders ED Discharge Orders     None         Prestyn Mahn, Jeannett Senior, MD 05/03/22 1753

## 2022-05-03 NOTE — Subjective & Objective (Signed)
Hx of sickle cell pain crisis Has chronic pain  Presents with typical sickle cell pain legs and back started from yesterday  No fever, chest pain or shortness of breath.

## 2022-05-03 NOTE — Assessment & Plan Note (Signed)
Continue OxyContin 10 mg p.o. twice daily

## 2022-05-03 NOTE — ED Triage Notes (Signed)
Pt presents with SCC starting yesterday. Tearful in triage. Pain in back and legs.

## 2022-05-03 NOTE — Assessment & Plan Note (Signed)
-   will admit per sickle cell protocol,    control pain,    hydrate with IVF D5 .45% Saline @ 100 mls/hour,    Weight based Dilaudid PCA for opioid tolerant patients.    Continue  folic acid   Transfuse as needed if Hg drops significantly below baseline.    No evidence of acute chest at this time   Sickle cell team to take over management in AM  

## 2022-05-03 NOTE — ED Provider Triage Note (Signed)
Emergency Medicine Provider Triage Evaluation Note  Nyela Cortinas , a 51 y.o. female  was evaluated in triage.  Pt complains of still some pain.  States it is all over but worse on the low back and right leg.  Has to be admitted to the hospital every time she has similar pain.  Took 10 mg oxycodone 3 hours prior to arrival.  Typically has to take 2-3 doses for the pain to go away.  Does not know what her baseline hemoglobin is or what she needs to be transfused that.  Denies chest pain, shortness of breath or fevers.  Review of Systems  Positive: As above Negative: As above  Physical Exam  BP 137/75 (BP Location: Left Arm)   Pulse 98   Temp 98.5 F (36.9 C) (Oral)   LMP 07/09/2021 (Approximate)   SpO2 99%  Gen:   Awake, crying in distress   Resp:  Normal effort  MSK:   Moves extremities without difficulty  Other:    Medical Decision Making  Medically screening exam initiated at 2:34 PM.  Appropriate orders placed.  Itati Cheyne Bungert was informed that the remainder of the evaluation will be completed by another provider, this initial triage assessment does not replace that evaluation, and the importance of remaining in the ED until their evaluation is complete.  Oxycodone 10 and Zofran 4 ordered in triage   Michelle Piper, Cordelia Poche 05/03/22 1436

## 2022-05-03 NOTE — H&P (Signed)
Lindsey [name] EAV:[id number] DOB: [birth year] DOA: 05/03/2022     PCP: Massie MaroonHollis, Lachina M, FNP      Patient arrived to ER on 05/03/22 at 1152 Referred by Attending Vanetta MuldersZackowski, Scott, MD   Patient coming from:    home Lives With family    Chief Complaint:   Chief Complaint  Patient presents with   Sickle Cell Pain Crisis    HPI: Lindsey [name] is a 51 y.o. female with medical history significant of   sickle cell disease, chronic pain, anemia, generalized anxiety disorder    Presented with typical sickle cell pain crisis pain Hx of sickle cell pain crisis Has chronic pain  Presents with typical sickle cell pain legs and back started from yesterday  No fever, chest pain or shortness of breath.  Patient takes folic acid At home on OxyContin 10 mg twice daily And oxycodone 10 mg every 6 hours for pain has been taking her pain med   Does not smoke or drink    Regarding pertinent Chronic problems:        Chronic anemia - baseline hg Hemoglobin & Hematocrit  Recent Labs    04/04/22 0505 04/04/22 0859 05/03/22 1509  HGB 9.8* 9.4* 11.2*     While in ER:  Started management for sickle cell pain crisis with Dilaudid IV x 3 doses with no improvement of pain patient noted to be transiently bradycardic while in the emergency department But still states her pain is uncontrolled    Following Medications were ordered in ER: Medications  ondansetron (ZOFRAN-ODT) disintegrating tablet 4 mg (has no administration in time range)  dextrose 5 %-0.45 % sodium chloride infusion ( Intravenous New Bag/Given 05/03/22 1517)  diphenhydrAMINE (BENADRYL) capsule 25-50 mg (25 mg Oral Given 05/03/22 1618)  ondansetron (ZOFRAN) injection 4 mg (4 mg Intravenous Given 05/03/22 1517)  oxyCODONE (Oxy IR/ROXICODONE) immediate release tablet 10 mg (10 mg Oral Given 05/03/22 1438)  ketorolac (TORADOL) 15 MG/ML injection 15 mg (15 mg Intravenous Given 05/03/22 1517)  HYDROmorphone  (DILAUDID) injection 2 mg (2 mg Intravenous Given 05/03/22 1517)  HYDROmorphone (DILAUDID) injection 2 mg (2 mg Intravenous Given 05/03/22 1618)  HYDROmorphone (DILAUDID) injection 2 mg (2 mg Intravenous Given 05/03/22 1650)       ED Triage Vitals  Enc Vitals Group     BP 05/03/22 1325 137/75     Pulse Rate 05/03/22 1325 98     Resp 05/03/22 1615 16     Temp 05/03/22 1325 98.5 F (36.9 C)     Temp Source 05/03/22 1325 Oral     SpO2 05/03/22 1325 99 %     Weight --      Height --      Head Circumference --      Peak Flow --      Pain Score 05/03/22 1426 10     Pain Loc --      Pain Edu? --      Excl. in GC? --   TMAX(24)@     _________________________________________ Significant initial  Findings: Abnormal Labs Reviewed  RETICULOCYTES - Abnormal; Notable for the following components:      Result Value   Retic Ct Pct 3.9 (*)    RBC. 3.72 (*)    Immature Retic Fract 26.6 (*)    All other components within normal limits  COMPREHENSIVE METABOLIC PANEL - Abnormal; Notable for the following components:   Total Bilirubin 1.3 (*)    All other components within normal  limits  CBC WITH DIFFERENTIAL/PLATELET - Abnormal; Notable for the following components:   RBC 3.74 (*)    Hemoglobin 11.2 (*)    HCT 30.2 (*)    MCHC 37.1 (*)    RDW 16.7 (*)    nRBC 0.4 (*)    Lymphs Abs 4.2 (*)    All other components within normal limits    ECG: Ordered    The recent clinical data is shown below. Vitals:   05/03/22 1700 05/03/22 1715 05/03/22 1800 05/03/22 1840  BP: (!) 98/58 100/62 94/67 106/64  Pulse: 64 (!) 58 66 (!) 54  Resp: 16 16 16 18   Temp:   98.1 F (36.7 C)   TempSrc:      SpO2: 97% 96% 91% 95%    WBC     Component Value Date/Time   WBC 9.5 05/03/2022 1509   LYMPHSABS 4.2 (H) 05/03/2022 1509   LYMPHSABS 2.5 05/20/2021 1101   MONOABS 0.5 05/03/2022 1509   EOSABS 0.2 05/03/2022 1509   EOSABS 0.3 05/20/2021 1101   BASOSABS 0.1 05/03/2022 1509   BASOSABS 0.1  05/20/2021 1101      _______________________________________________ Hospitalist was called for admission for   Sickle cell pain crisis    The following Work up has been ordered so far:  Orders Placed This Encounter  Procedures   Reticulocytes   Comprehensive metabolic panel   CBC WITH DIFFERENTIAL   hCG, quantitative, pregnancy   Vital signs with O2 sat, q1hour   Cardiac monitoring   Monitor O2 SATs   Weigh patient in Kg   Saline Lock IV-Maintain IV access   Patient may eat/drink   Initiate Carrier Fluid Protocol   Vital signs with O2 sat, q1hour   Monitor O2 SATs   Weigh patient in Kg   Saline Lock IV-Maintain IV access   Patient may eat/drink   Initiate Carrier Fluid Protocol   Consult to sickle cell medicine Reason for Consult? Sickle Cell Crisis   Consult to hospitalist   Consult to hospitalist     OTHER Significant initial  Findings:  labs showing:  Recent Labs  Lab 05/03/22 1509  NA 142  K 3.7  CO2 24  GLUCOSE 96  BUN 10  CREATININE 0.81  CALCIUM 9.3    Cr   stable,    Lab Results  Component Value Date   CREATININE 0.81 05/03/2022   CREATININE 0.64 04/04/2022   CREATININE 0.84 03/26/2022    Recent Labs  Lab 05/03/22 1509  AST 20  ALT 16  ALKPHOS 41  BILITOT 1.3*  PROT 7.3  ALBUMIN 4.3   Lab Results  Component Value Date   CALCIUM 9.3 05/03/2022   PHOS 3.9 01/01/2022    Plt: Lab Results  Component Value Date   PLT 393 05/03/2022    COVID-19 Labs  No results for input(s): "DDIMER", "FERRITIN", "LDH", "CRP" in the last 72 hours.  Lab Results  Component Value Date   SARSCOV2NAA NEGATIVE 07/16/2021   SARSCOV2NAA NEGATIVE 06/19/2021   SARSCOV2NAA NEGATIVE 05/31/2021   SARSCOV2NAA NEGATIVE 05/09/2021     Recent Labs  Lab 05/03/22 1509  WBC 9.5  NEUTROABS 4.6  HGB 11.2*  HCT 30.2*  MCV 80.7  PLT 393    HG/HCT  stable,      Component Value Date/Time   HGB 11.2 (L) 05/03/2022 1509   HGB 11.0 (L) 05/20/2021 1101   HCT  30.2 (L) 05/03/2022 1509   HCT 31.3 (L) 05/20/2021 1101   MCV 80.7  05/03/2022 1509   MCV 88 05/20/2021 1101     No results for input(s): "LIPASE", "AMYLASE" in the last 168 hours. No results for input(s): "AMMONIA" in the last 168 hours.        Cultures:    Component Value Date/Time   SDES  05/31/2021 1350    URINE, CLEAN CATCH Performed at [facility], [address] [place], [place] [address]    SPECREQUEST  05/31/2021 1350    NONE Performed at [facility], [address], [place], [place] [address]    CULT MULTIPLE SPECIES PRESENT, SUGGEST RECOLLECTION (A) 05/31/2021 1350   REPTSTATUS 06/01/2021 FINAL 05/31/2021 1350     Radiological Exams on Admission: No results found. _______________________________________________________________________________________________________ Latest  Blood pressure 106/64, pulse (!) 54, temperature 98.1 F (36.7 C), resp. rate 18, last menstrual period 07/09/2021, SpO2 95 %.   Vitals  labs and radiology finding personally reviewed  Review of Systems:    Pertinent positives include: Body aches , fatigue, Constitutional:  No weight loss, night sweats, Fevers, chills weight loss  HEENT:  No headaches, Difficulty swallowing,Tooth/dental problems,Sore throat,  No sneezing, itching, ear ache, nasal congestion, post nasal drip,  Cardio-vascular:  No chest pain, Orthopnea, PND, anasarca, dizziness, palpitations.no Bilateral lower extremity swelling  GI:  No heartburn, indigestion, abdominal pain, nausea, vomiting, diarrhea, change in bowel habits, loss of appetite, melena, blood in stool, hematemesis Resp:  no shortness of breath at rest. No dyspnea on exertion, No excess mucus, no productive cough, No non-productive cough, No coughing up of blood.No change in color of mucus.No wheezing. Skin:  no rash or lesions. No jaundice GU:  no dysuria, change in color of urine, no urgency or frequency. No  straining to urinate.  No flank pain.  Musculoskeletal:  No joint pain or no joint swelling. No decreased range of motion. No back pain.  Psych:  No change in mood or affect. No depression or anxiety. No memory loss.  Neuro: no localizing neurological complaints, no tingling, no weakness, no double vision, no gait abnormality, no slurred speech, no confusion  All systems reviewed and apart from HOPI all are negative _______________________________________________________________________________________________ Past Medical History:   Past Medical History:  Diagnosis Date   Acute kidney injury (HCC) 01/18/2021   Acute pain of left shoulder    Cough 06/27/2020   Hypokalemia 06/27/2020   Hypoxia    Leukocytosis 09/03/2016   Opiate abuse, episodic (HCC) 09/25/2017   Opioid dependence in remission (HCC)    Right leg pain    Sickle cell crisis (HCC)    Vasoocclusive sickle cell crisis (HCC) 07/17/2021      Past Surgical History:  Procedure Laterality Date   APPENDECTOMY     CESAREAN SECTION     OTHER SURGICAL HISTORY     c-section    Social History:  Ambulatory   independently      reports that she has been smoking cigars and cigarettes. She has never used smokeless tobacco. She reports current alcohol use. She reports that she does not use drugs.    Family History:   Family History  Problem Relation Age of Onset   Stroke Neg Hx        none that she knows of    Seizures Neg Hx    ______________________________________________________________________________________________ Allergies: Allergies  Allergen Reactions   Ketamine Anxiety and Other (See Comments)    Tachycardia     Prior to Admission medications   Medication Sig Start Date End  Date Taking? Authorizing Provider  ALPRAZolam (XANAX) 0.25 MG tablet Take 1 tablet (0.25 mg total) by mouth 2 (two) times daily as needed for anxiety. Patient not taking: Reported on 03/25/2022 12/31/21   Rometta Emery, MD   carboxymethylcellulose (REFRESH PLUS) 0.5 % SOLN Place 1 drop into both eyes daily as needed (dry eyes).    [provider]  diphenhydrAMINE (BENADRYL ALLERGY) 25 MG tablet Take 50 mg by mouth at bedtime as needed for sleep.    [provider]  DULoxetine (CYMBALTA) 20 MG capsule Take 1 capsule (20 mg total) by mouth daily. Patient not taking: Reported on 03/25/2022 12/31/21 12/31/22  Rometta Emery, MD  folic acid (FOLVITE) 1 MG tablet Take 1 tablet (1 mg total) by mouth daily. 12/31/21 12/31/22  Rometta Emery, MD  gabapentin (NEURONTIN) 400 MG capsule Take 1 capsule (400 mg total) by mouth 3 (three) times daily. 04/07/22   Massie Maroon, FNP  ibuprofen (ADVIL) 200 MG tablet Take 400 mg by mouth every 6 (six) hours as needed for mild pain.    [provider]  oxyCODONE (OXYCONTIN) 10 mg 12 hr tablet Take 1 tablet (10 mg total) by mouth every 12 (twelve) hours. 04/18/22   Massie Maroon, FNP  Oxycodone HCl 10 MG TABS Take 1 tablet (10 mg total) by mouth every 6 (six) hours as needed for up to 15 days (for moderate to severe pain). 04/26/22 05/11/22  Massie Maroon, FNP    ___________________________________________________________________________________________________ Physical Exam:    05/03/2022    6:40 PM 05/03/2022    6:00 PM 05/03/2022    5:15 PM  Vitals with BMI  Systolic 106 94 100  Diastolic 64 67 62  Pulse 54 66 58     1. General:  in    Acute distress tearful  Chronically ill  -appearing 2. Psychological: Alert and   Oriented 3. Head/ENT: Dry Mucous Membranes                          Head Non traumatic, neck supple                         Poor Dentition 4. SKIN: decreased Skin turgor,  Skin clean Dry and intact no rash 5. Heart: Regular rate and rhythm no  Murmur, no Rub or gallop 6. Lungs:   no wheezes or crackles   7. Abdomen: Soft,  non-tender, Non distended   obese   bowel sounds present 8. Lower extremities: no clubbing,  cyanosis, no  edema 9. Neurologically Grossly intact, moving all 4 extremities equally  10. MSK: Normal range of motion    Chart has been reviewed  __________ ______________________  Assessment/Plan 51 y.o. female with medical history significant of   sickle cell disease, chronic pain, anemia, generalized anxiety disorder  Admitted for   Sickle cell pain crisis    Present on Admission:  Sickle cell anemia with crisis (HCC)  Chronic pain     Sickle cell anemia with crisis (HCC) - will admit per sickle cell protocol,    control pain,    hydrate with IVF D5 .45% Saline @ 100 mls/hour,    Weight based Dilaudid PCA for opioid tolerant patients.    Continue folic acid   Transfuse as needed if Hg drops significantly below baseline.    No evidence of acute chest at this time   Sickle cell team to take  over management in AM   Chronic pain Continue OxyContin 10 mg p.o. twice daily    Other plan as per orders.  DVT prophylaxis:   Lovenox       Code Status:    Code Status: Prior FULL CODE as per patient   I had personally discussed CODE STATUS with patient      Family Communication:   Family not at  Bedside    Disposition Plan:     To home once workup is complete and patient is stable   Following barriers for discharge:                                                          Pain controlled with PO medications                                  Consults called:  none  Admission status:  ED Disposition     ED Disposition  Admit   Condition  --   Comment  Hospital [facility] [[id number]]  Level of Care: Telemetry [5]  Admit to tele based on following criteria: Other see comments  Comments: bradycardia  May admit patient to [facility] or [facility] if equivalent level of care is available:: No  Covid Evaluation: Asymptomatic - no recent exposure (last 10 days) testing not required  Diagnosis: Sickle cell anemia with crisis [facility]) [[id number]]   Admitting Physician: Therisa Doyne [[id number]]  Attending Physician: Therisa Doyne [[id number]]  Certification:: I certify this patient will need inpatient services for at least 2 midnights  Estimated Length of Stay: 2            inpatient     I Expect 2 midnight stay secondary to severity of patient's current illness need for inpatient interventions justified by the following:    Severe lab/radiological/exam abnormalities including:   Sickle cell pain crisis and extensive comorbidities including:  Chronic pain    sickle cell disease     That are currently affecting medical management.   I expect  patient to be hospitalized for 2 midnights requiring inpatient medical care.  Patient is at high risk for adverse outcome (such as loss of life or disability) if not treated.  Indication for inpatient stay as follows:   severe pain requiring acute inpatient management,     Need for   IV pain medications, PCA [place] there is ae    Level of care    medical floor     Therisa Doyne 05/03/2022, 7:13 PM    [facility]     after 2 AM please page floor coverage PA If 7AM-7PM, please contact the day team taking care of the patient using [email or web address]   Patient was evaluated in the context of the global COVID-19 pandemic, which necessitated consideration that the patient might be at risk for infection with the SARS-CoV-2 virus that causes COVID-19. Institutional protocols and algorithms that pertain to the evaluation of patients at risk for COVID-19 are in a state of rapid change based on information released by regulatory bodies including the CDC and federal and state organizations. These policies and algorithms were followed during the patient's care.

## 2022-05-03 NOTE — ED Notes (Signed)
Assumed care, pt is alert and oriented but tearful in bed. She reports a 8/10 pain at this time. RN updated her of her current bed status up on the floor.  Pt called out after RN left room asking for pain medication.  RN sent message to provider who is on her way to see the pt.

## 2022-05-04 ENCOUNTER — Encounter (HOSPITAL_COMMUNITY): Payer: Self-pay | Admitting: Internal Medicine

## 2022-05-04 DIAGNOSIS — D5700 Hb-SS disease with crisis, unspecified: Secondary | ICD-10-CM | POA: Diagnosis not present

## 2022-05-04 LAB — CBC
HCT: 26.1 % — ABNORMAL LOW (ref 36.0–46.0)
Hemoglobin: 9.5 g/dL — ABNORMAL LOW (ref 12.0–15.0)
MCH: 29.8 pg (ref 26.0–34.0)
MCHC: 36.4 g/dL — ABNORMAL HIGH (ref 30.0–36.0)
MCV: 81.8 fL (ref 80.0–100.0)
Platelets: 338 10*3/uL (ref 150–400)
RBC: 3.19 MIL/uL — ABNORMAL LOW (ref 3.87–5.11)
RDW: 16.5 % — ABNORMAL HIGH (ref 11.5–15.5)
WBC: 8.8 10*3/uL (ref 4.0–10.5)
nRBC: 0.3 % — ABNORMAL HIGH (ref 0.0–0.2)

## 2022-05-04 LAB — COMPREHENSIVE METABOLIC PANEL
ALT: 14 U/L (ref 0–44)
AST: 19 U/L (ref 15–41)
Albumin: 4.2 g/dL (ref 3.5–5.0)
Alkaline Phosphatase: 34 U/L — ABNORMAL LOW (ref 38–126)
Anion gap: 5 (ref 5–15)
BUN: 12 mg/dL (ref 6–20)
CO2: 25 mmol/L (ref 22–32)
Calcium: 8.7 mg/dL — ABNORMAL LOW (ref 8.9–10.3)
Chloride: 109 mmol/L (ref 98–111)
Creatinine, Ser: 0.97 mg/dL (ref 0.44–1.00)
GFR, Estimated: >60 mL/min (ref >60)
Glucose, Bld: 102 mg/dL — ABNORMAL HIGH (ref 70–99)
Potassium: 3.7 mmol/L (ref 3.5–5.1)
Sodium: 139 mmol/L (ref 135–145)
Total Bilirubin: 1.2 mg/dL (ref 0.3–1.2)
Total Protein: 7.3 g/dL (ref 6.5–8.1)

## 2022-05-04 LAB — GLUCOSE, CAPILLARY
Glucose-Capillary: 100 mg/dL — ABNORMAL HIGH (ref 70–99)
Glucose-Capillary: 101 mg/dL — ABNORMAL HIGH (ref 70–99)
Glucose-Capillary: 105 mg/dL — ABNORMAL HIGH (ref 70–99)
Glucose-Capillary: 108 mg/dL — ABNORMAL HIGH (ref 70–99)
Glucose-Capillary: 138 mg/dL — ABNORMAL HIGH (ref 70–99)

## 2022-05-04 LAB — RETICULOCYTES
Immature Retic Fract: 23.2 % — ABNORMAL HIGH (ref 2.3–15.9)
RBC.: 3.21 MIL/uL — ABNORMAL LOW (ref 3.87–5.11)
Retic Count, Absolute: 105.3 10*3/uL (ref 19.0–186.0)
Retic Ct Pct: 3.3 % — ABNORMAL HIGH (ref 0.4–3.1)

## 2022-05-04 LAB — TSH: TSH: 3.303 u[IU]/mL (ref 0.350–4.500)

## 2022-05-04 MED ORDER — OXYCODONE HCL 5 MG PO TABS
10.0000 mg | ORAL_TABLET | Freq: Four times a day (QID) | ORAL | Status: DC | PRN
  Administered 2022-05-04 – 2022-05-07 (×10): 10 mg via ORAL
  Filled 2022-05-04 (×10): qty 2

## 2022-05-04 MED ORDER — SODIUM CHLORIDE 0.9 % IV BOLUS
250.0000 mL | Freq: Once | INTRAVENOUS | Status: AC
  Administered 2022-05-04: 250 mL via INTRAVENOUS

## 2022-05-04 MED ORDER — HYDROMORPHONE 1 MG/ML IV SOLN
INTRAVENOUS | Status: DC
  Administered 2022-05-04: 30 mg via INTRAVENOUS
  Administered 2022-05-05: 2 mg via INTRAVENOUS
  Administered 2022-05-05: 30 mg via INTRAVENOUS
  Administered 2022-05-05: 5 mg via INTRAVENOUS
  Administered 2022-05-05: 1.5 mg via INTRAVENOUS
  Administered 2022-05-05: 3 mg via INTRAVENOUS
  Administered 2022-05-06: 4 mg via INTRAVENOUS
  Administered 2022-05-06: 3.5 mg via INTRAVENOUS
  Administered 2022-05-06: 30 mg via INTRAVENOUS
  Administered 2022-05-06: 4.5 mg via INTRAVENOUS
  Administered 2022-05-06: 3 mg via INTRAVENOUS
  Administered 2022-05-06: 1 mg via INTRAVENOUS
  Administered 2022-05-06: 2.5 mg via INTRAVENOUS
  Administered 2022-05-06: 1.5 mg via INTRAVENOUS
  Administered 2022-05-07: 1 mg via INTRAVENOUS
  Administered 2022-05-07: 1.5 mg via INTRAVENOUS
  Administered 2022-05-07: 0.5 mg via INTRAVENOUS
  Filled 2022-05-04 (×2): qty 30

## 2022-05-04 NOTE — Progress Notes (Signed)
.   Transition of Care [facility]) Screening Note   Patient Details  Name: Lindsey [name] Date of Birth: February 16, 1971   Transition of Care [facility]) CM/SW Contact:    Larrie Kass, LCSW Phone Number: 05/04/2022, 2:43 PM    Transition of Care Department [facility]) has reviewed patient and no TOC needs have been identified at this time. We will continue to monitor patient advancement through interdisciplinary progression rounds. If new patient transition needs arise, please place a TOC consult.

## 2022-05-04 NOTE — Progress Notes (Signed)
PROGRESS NOTE    Lindsey [name]  AGT:[id number] DOB: 02/20/1971 DOA: 05/03/2022 PCP: Massie Maroon, FNP  51/F with sickle cell disease, chronic pain on oxycodone presenting to the ED with worsening pain in her legs and back X 1 day, symptoms not managed on home regimen of narcotics -Presented to the ED, required 3 doses of Dilaudid and then admitted  Subjective: -Uncomfortable, not getting much relief on current meds  Assessment and Plan:  Sickle cell anemia with crisis -Continue supportive care with O2, IV fluids at 100 cc/h -Dilaudid weight-based PCA, increase max dose -Continue Toradol, folic acid -Check CBC, LDH in a.m. -Resume oxycodone  Chronic pain -Continue home regimen of OxyContin   DVT prophylaxis: Lovenox Code Status: Full code Family Communication: None present Disposition Plan: Home likely 2 to 3 days  Consultants:    Procedures:   Antimicrobials:    Objective: Vitals:   05/04/22 0012 05/04/22 0452 05/04/22 0511 05/04/22 1018  BP: (!) 84/55 (!) 88/43  (!) 83/66  Pulse: (!) 55 (!) 55  (!) 51  Resp: 17 17 15 18   Temp: 98.9 F (37.2 C) 98.5 F (36.9 C)  98 F (36.7 C)  TempSrc: Oral Oral  Oral  SpO2: 96% 97% 95% 90%   No intake or output data in the 24 hours ending 05/04/22 1113 There were no vitals filed for this visit.  Examination:  General exam: Chronically ill female uncomfortable appearing, AAOx3 Respiratory system: Clear to auscultation Cardiovascular system: S1 & S2 heard, RRR.  Abd: nondistended, soft and nontender.Normal bowel sounds heard. Central nervous system: Alert and oriented. No focal neurological deficits. Extremities: no edema Skin: No rashes Psychiatry:  Mood & affect appropriate.     Data Reviewed:   CBC: Recent Labs  Lab 05/03/22 1509 05/04/22 0858  WBC 9.5 8.8  NEUTROABS 4.6  --   HGB 11.2* 9.5*  HCT 30.2* 26.1*  MCV 80.7 81.8  PLT 393 338   Basic Metabolic Panel: Recent Labs  Lab  05/03/22 1509 05/03/22 1529 05/04/22 0858  NA 142  --  139  K 3.7  --  3.7  CL 111  --  109  CO2 24  --  25  GLUCOSE 96  --  102*  BUN 10  --  12  CREATININE 0.81  --  0.97  CALCIUM 9.3  --  8.7*  MG  --  2.0  --   PHOS  --  3.0  --    GFR: CrCl cannot be calculated (Unknown ideal weight.). Liver Function Tests: Recent Labs  Lab 05/03/22 1509 05/04/22 0858  AST 20 19  ALT 16 14  ALKPHOS 41 34*  BILITOT 1.3* 1.2  PROT 7.3 7.3  ALBUMIN 4.3 4.2   No results for input(s): "LIPASE", "AMYLASE" in the last 168 hours. No results for input(s): "AMMONIA" in the last 168 hours. Coagulation Profile: No results for input(s): "INR", "PROTIME" in the last 168 hours. Cardiac Enzymes: Recent Labs  Lab 05/03/22 1529  CKTOTAL 43   BNP (last 3 results) No results for input(s): "PROBNP" in the last 8760 hours. HbA1C: No results for input(s): "HGBA1C" in the last 72 hours. CBG: Recent Labs  Lab 05/04/22 0011 05/04/22 0455 05/04/22 0744  GLUCAP 101* 105* 100*   Lipid Profile: No results for input(s): "CHOL", "HDL", "LDLCALC", "TRIG", "CHOLHDL", "LDLDIRECT" in the last 72 hours. Thyroid Function Tests: Recent Labs    05/04/22 0858  TSH 3.303   Anemia Panel: Recent Labs  05/03/22 1509 05/04/22 0858  RETICCTPCT 3.9* 3.3*   Urine analysis:    Component Value Date/Time   COLORURINE YELLOW 05/31/2021 1350   APPEARANCEUR TURBID (A) 05/31/2021 1350   LABSPEC 1.012 05/31/2021 1350   PHURINE 5.0 05/31/2021 1350   GLUCOSEU NEGATIVE 05/31/2021 1350   HGBUR SMALL (A) 05/31/2021 1350   BILIRUBINUR NEGATIVE 05/31/2021 1350   BILIRUBINUR neg 04/07/2020 1014   KETONESUR NEGATIVE 05/31/2021 1350   PROTEINUR 100 (A) 05/31/2021 1350   UROBILINOGEN 0.2 04/07/2020 1014   UROBILINOGEN 1.0 12/25/2012 1755   NITRITE NEGATIVE 05/31/2021 1350   LEUKOCYTESUR MODERATE (A) 05/31/2021 1350   Sepsis Labs: @LABRCNTIP (procalcitonin:4,lacticidven:4)  )No results found for this or any  previous visit (from the past 240 hour(s)).   Radiology Studies: No results found.   Scheduled Meds:  enoxaparin (LOVENOX) injection  40 mg Subcutaneous Q24H   folic acid  1 mg Oral Daily   gabapentin  400 mg Oral TID   HYDROmorphone   Intravenous Q4H   ketorolac  15 mg Intravenous Q6H   oxyCODONE  10 mg Oral Q12H   senna-docusate  1 tablet Oral BID   Continuous Infusions:  dextrose 5 % and 0.45% NaCl 100 mL/hr at 05/03/22 2237     LOS: 1 day    Time spent: 2238    , MD [facility]   05/04/2022, 11:13 AM

## 2022-05-05 DIAGNOSIS — D5700 Hb-SS disease with crisis, unspecified: Secondary | ICD-10-CM | POA: Diagnosis not present

## 2022-05-05 LAB — CBC
HCT: 23.3 % — ABNORMAL LOW (ref 36.0–46.0)
Hemoglobin: 8.5 g/dL — ABNORMAL LOW (ref 12.0–15.0)
MCH: 29.6 pg (ref 26.0–34.0)
MCHC: 36.5 g/dL — ABNORMAL HIGH (ref 30.0–36.0)
MCV: 81.2 fL (ref 80.0–100.0)
Platelets: 270 10*3/uL (ref 150–400)
RBC: 2.87 MIL/uL — ABNORMAL LOW (ref 3.87–5.11)
RDW: 16.4 % — ABNORMAL HIGH (ref 11.5–15.5)
WBC: 8.6 10*3/uL (ref 4.0–10.5)
nRBC: 0.3 % — ABNORMAL HIGH (ref 0.0–0.2)

## 2022-05-05 LAB — LACTATE DEHYDROGENASE: LDH: 121 U/L (ref 98–192)

## 2022-05-05 NOTE — Progress Notes (Signed)
PROGRESS NOTE    Lindsey [name]  QTM:[id number] DOB: [birth year] DOA: 05/03/2022 PCP: Massie Maroon, FNP  51/F with sickle cell disease, chronic pain on oxycodone presenting to the ED with worsening pain in her legs and back X 1 day, symptoms not managed on home regimen of narcotics -Presented to the ED, required 3 doses of Dilaudid and then admitted  Subjective: -Starting to get more comfortable today, did not get much sleep last night  Assessment and Plan:  Sickle cell anemia with crisis -Slowly improving, continue supportive care, O2, NS at 100 mL/h -Dilaudid weight-based PCA, max dose was increased yesterday -Continue Toradol, folic acid -Hemoglobin is stable, oxycodone resumed yesterday -Increase activity as tolerated  Chronic pain -Continue home regimen of OxyContin   DVT prophylaxis: Lovenox Code Status: Full code Family Communication: None present Disposition Plan: Home likely 2 to 3 days  Consultants:    Procedures:   Antimicrobials:    Objective: Vitals:   05/05/22 0201 05/05/22 0540 05/05/22 0807 05/05/22 1005  BP: 96/66   101/62  Pulse: (!) 51   (!) 54  Resp: 18 17 14 18   Temp: 98.7 F (37.1 C)   98 F (36.7 C)  TempSrc: Oral   Oral  SpO2: 95% 98% 95% 96%    Intake/Output Summary (Last 24 hours) at 05/05/2022 1039 Last data filed at 05/05/2022 05/07/2022 Gross per 24 hour  Intake 240 ml  Output --  Net 240 ml   There were no vitals filed for this visit.  Examination:  General exam: Chronically ill female, appears more comfortable today, AAOx3, no distress CVS: S1-S2, regular rhythm Lungs: Decreased breath sounds to bases Abdomen: Soft, nontender, bowel sounds present Extremities: No edema Skin: No rashes Psychiatry: Flat affect    Data Reviewed:   CBC: Recent Labs  Lab 05/03/22 1509 05/04/22 0858 05/05/22 0750  WBC 9.5 8.8 8.6  NEUTROABS 4.6  --   --   HGB 11.2* 9.5* 8.5*  HCT 30.2* 26.1* 23.3*  MCV 80.7 81.8 81.2   PLT 393 338 270   Basic Metabolic Panel: Recent Labs  Lab 05/03/22 1509 05/03/22 1529 05/04/22 0858  NA 142  --  139  K 3.7  --  3.7  CL 111  --  109  CO2 24  --  25  GLUCOSE 96  --  102*  BUN 10  --  12  CREATININE 0.81  --  0.97  CALCIUM 9.3  --  8.7*  MG  --  2.0  --   PHOS  --  3.0  --    GFR: CrCl cannot be calculated (Unknown ideal weight.). Liver Function Tests: Recent Labs  Lab 05/03/22 1509 05/04/22 0858  AST 20 19  ALT 16 14  ALKPHOS 41 34*  BILITOT 1.3* 1.2  PROT 7.3 7.3  ALBUMIN 4.3 4.2   No results for input(s): "LIPASE", "AMYLASE" in the last 168 hours. No results for input(s): "AMMONIA" in the last 168 hours. Coagulation Profile: No results for input(s): "INR", "PROTIME" in the last 168 hours. Cardiac Enzymes: Recent Labs  Lab 05/03/22 1529  CKTOTAL 43   BNP (last 3 results) No results for input(s): "PROBNP" in the last 8760 hours. HbA1C: No results for input(s): "HGBA1C" in the last 72 hours. CBG: Recent Labs  Lab 05/04/22 0011 05/04/22 0455 05/04/22 0744 05/04/22 1147 05/04/22 1605  GLUCAP 101* 105* 100* 108* 138*   Lipid Profile: No results for input(s): "CHOL", "HDL", "LDLCALC", "TRIG", "CHOLHDL", "LDLDIRECT" in the last  72 hours. Thyroid Function Tests: Recent Labs    05/04/22 0858  TSH 3.303   Anemia Panel: Recent Labs    05/03/22 1509 05/04/22 0858  RETICCTPCT 3.9* 3.3*   Urine analysis:    Component Value Date/Time   COLORURINE YELLOW 05/31/2021 1350   APPEARANCEUR TURBID (A) 05/31/2021 1350   LABSPEC 1.012 05/31/2021 1350   PHURINE 5.0 05/31/2021 1350   GLUCOSEU NEGATIVE 05/31/2021 1350   HGBUR SMALL (A) 05/31/2021 1350   BILIRUBINUR NEGATIVE 05/31/2021 1350   BILIRUBINUR neg 04/07/2020 1014   KETONESUR NEGATIVE 05/31/2021 1350   PROTEINUR 100 (A) 05/31/2021 1350   UROBILINOGEN 0.2 04/07/2020 1014   UROBILINOGEN 1.0 12/25/2012 1755   NITRITE NEGATIVE 05/31/2021 1350   LEUKOCYTESUR MODERATE (A) 05/31/2021  1350   Sepsis Labs: @LABRCNTIP (procalcitonin:4,lacticidven:4)  )No results found for this or any previous visit (from the past 240 hour(s)).   Radiology Studies: No results found.   Scheduled Meds:  enoxaparin (LOVENOX) injection  40 mg Subcutaneous Q24H   folic acid  1 mg Oral Daily   gabapentin  400 mg Oral TID   HYDROmorphone   Intravenous Q4H   ketorolac  15 mg Intravenous Q6H   oxyCODONE  10 mg Oral Q12H   senna-docusate  1 tablet Oral BID   Continuous Infusions:  dextrose 5 % and 0.45% NaCl 100 mL/hr at 05/03/22 2237     LOS: 2 days    Time spent: 2238    , MD [facility]   05/05/2022, 10:39 AM

## 2022-05-06 DIAGNOSIS — D5700 Hb-SS disease with crisis, unspecified: Secondary | ICD-10-CM | POA: Diagnosis not present

## 2022-05-06 LAB — CBC
HCT: 23 % — ABNORMAL LOW (ref 36.0–46.0)
Hemoglobin: 8.4 g/dL — ABNORMAL LOW (ref 12.0–15.0)
MCH: 29.9 pg (ref 26.0–34.0)
MCHC: 36.5 g/dL — ABNORMAL HIGH (ref 30.0–36.0)
MCV: 81.9 fL (ref 80.0–100.0)
Platelets: 256 10*3/uL (ref 150–400)
RBC: 2.81 MIL/uL — ABNORMAL LOW (ref 3.87–5.11)
RDW: 16.4 % — ABNORMAL HIGH (ref 11.5–15.5)
WBC: 8.2 10*3/uL (ref 4.0–10.5)
nRBC: 0.4 % — ABNORMAL HIGH (ref 0.0–0.2)

## 2022-05-06 LAB — LACTATE DEHYDROGENASE: LDH: 121 U/L (ref 98–192)

## 2022-05-06 NOTE — Progress Notes (Signed)
PROGRESS NOTE    Lindsey [name]  NWG:[id number] DOB: 12/06/1970 DOA: 05/03/2022 PCP: Massie Maroon, FNP  51/F with sickle cell disease, chronic pain on oxycodone presenting to the ED with worsening pain in her legs and back X 1 day, symptoms not managed on home regimen of narcotics -Presented to the ED, required 3 doses of Dilaudid and then admitted  Subjective: -Pain improving, rates it at 3-5, thinks she might be ready to go home tomorrow  Assessment and Plan:  Sickle cell anemia with crisis -Slowly improving, continue supportive care, O2, NS at 100 mL/h -Dilaudid weight-based PCA, max dose was increased yesterday -Continue Toradol, folic acid -Hemoglobin is stable, LDH is normal -Increase activity as tolerated -Discharge planning, home likely tomorrow  Chronic pain -Continue home regimen of OxyContin -Gabapentin   DVT prophylaxis: Lovenox Code Status: Full code Disposition Plan: Home likely tomorrow  Consultants:    Procedures:   Antimicrobials:    Objective: Vitals:   05/06/22 0008 05/06/22 0521 05/06/22 0825 05/06/22 1043  BP:  110/71  131/77  Pulse:  (!) 56  (!) 55  Resp:  18 18 20   Temp:  98.6 F (37 C)  98.4 F (36.9 C)  TempSrc:  Oral  Oral  SpO2: 96% 97% 98% 100%    Intake/Output Summary (Last 24 hours) at 05/06/2022 1104 Last data filed at 05/06/2022 0448 Gross per 24 hour  Intake 1912.68 ml  Output --  Net 1912.68 ml   There were no vitals filed for this visit.  Examination:  General exam: Pleasant chronically ill female laying in bed, appears more comfortable today, AAOx3 CVS: S1-S2, regular rhythm Lungs: Improving air movement, clear Abdomen: Soft, nontender, bowel sounds present Extremities: No edema Skin: No rashes Psychiatry: Flat affect    Data Reviewed:   CBC: Recent Labs  Lab 05/03/22 1509 05/04/22 0858 05/05/22 0750 05/06/22 0648  WBC 9.5 8.8 8.6 8.2  NEUTROABS 4.6  --   --   --   HGB 11.2* 9.5* 8.5*  8.4*  HCT 30.2* 26.1* 23.3* 23.0*  MCV 80.7 81.8 81.2 81.9  PLT 393 338 270 256   Basic Metabolic Panel: Recent Labs  Lab 05/03/22 1509 05/03/22 1529 05/04/22 0858  NA 142  --  139  K 3.7  --  3.7  CL 111  --  109  CO2 24  --  25  GLUCOSE 96  --  102*  BUN 10  --  12  CREATININE 0.81  --  0.97  CALCIUM 9.3  --  8.7*  MG  --  2.0  --   PHOS  --  3.0  --    GFR: CrCl cannot be calculated (Unknown ideal weight.). Liver Function Tests: Recent Labs  Lab 05/03/22 1509 05/04/22 0858  AST 20 19  ALT 16 14  ALKPHOS 41 34*  BILITOT 1.3* 1.2  PROT 7.3 7.3  ALBUMIN 4.3 4.2   No results for input(s): "LIPASE", "AMYLASE" in the last 168 hours. No results for input(s): "AMMONIA" in the last 168 hours. Coagulation Profile: No results for input(s): "INR", "PROTIME" in the last 168 hours. Cardiac Enzymes: Recent Labs  Lab 05/03/22 1529  CKTOTAL 43   BNP (last 3 results) No results for input(s): "PROBNP" in the last 8760 hours. HbA1C: No results for input(s): "HGBA1C" in the last 72 hours. CBG: Recent Labs  Lab 05/04/22 0011 05/04/22 0455 05/04/22 0744 05/04/22 1147 05/04/22 1605  GLUCAP 101* 105* 100* 108* 138*   Lipid Profile: No  results for input(s): "CHOL", "HDL", "LDLCALC", "TRIG", "CHOLHDL", "LDLDIRECT" in the last 72 hours. Thyroid Function Tests: Recent Labs    05/04/22 0858  TSH 3.303   Anemia Panel: Recent Labs    05/03/22 1509 05/04/22 0858  RETICCTPCT 3.9* 3.3*   Urine analysis:    Component Value Date/Time   COLORURINE YELLOW 05/31/2021 1350   APPEARANCEUR TURBID (A) 05/31/2021 1350   LABSPEC 1.012 05/31/2021 1350   PHURINE 5.0 05/31/2021 1350   GLUCOSEU NEGATIVE 05/31/2021 1350   HGBUR SMALL (A) 05/31/2021 1350   BILIRUBINUR NEGATIVE 05/31/2021 1350   BILIRUBINUR neg 04/07/2020 1014   KETONESUR NEGATIVE 05/31/2021 1350   PROTEINUR 100 (A) 05/31/2021 1350   UROBILINOGEN 0.2 04/07/2020 1014   UROBILINOGEN 1.0 12/25/2012 1755   NITRITE  NEGATIVE 05/31/2021 1350   LEUKOCYTESUR MODERATE (A) 05/31/2021 1350   Sepsis Labs: @LABRCNTIP (procalcitonin:4,lacticidven:4)  )No results found for this or any previous visit (from the past 240 hour(s)).   Radiology Studies: No results found.   Scheduled Meds:  enoxaparin (LOVENOX) injection  40 mg Subcutaneous [id number]   folic acid  1 mg Oral Daily   gabapentin  400 mg Oral TID   HYDROmorphone   Intravenous Q4H   ketorolac  15 mg Intravenous Q6H   oxyCODONE  10 mg Oral Q12H   senna-docusate  1 tablet Oral BID   Continuous Infusions:  dextrose 5 % and 0.45% NaCl 100 mL/hr at 05/06/22 0825     LOS: 3 days    Time spent: 59min    Domenic Polite, MD [facility]   05/06/2022, 11:04 AM

## 2022-05-07 ENCOUNTER — Other Ambulatory Visit: Payer: Self-pay | Admitting: Family Medicine

## 2022-05-07 DIAGNOSIS — G894 Chronic pain syndrome: Secondary | ICD-10-CM

## 2022-05-07 DIAGNOSIS — D5700 Hb-SS disease with crisis, unspecified: Secondary | ICD-10-CM

## 2022-05-07 LAB — CBC
HCT: 24.6 % — ABNORMAL LOW (ref 36.0–46.0)
Hemoglobin: 8.9 g/dL — ABNORMAL LOW (ref 12.0–15.0)
MCH: 29.5 pg (ref 26.0–34.0)
MCHC: 36.2 g/dL — ABNORMAL HIGH (ref 30.0–36.0)
MCV: 81.5 fL (ref 80.0–100.0)
Platelets: 266 10*3/uL (ref 150–400)
RBC: 3.02 MIL/uL — ABNORMAL LOW (ref 3.87–5.11)
RDW: 16.7 % — ABNORMAL HIGH (ref 11.5–15.5)
WBC: 8.8 10*3/uL (ref 4.0–10.5)
nRBC: 0.2 % (ref 0.0–0.2)

## 2022-05-07 LAB — LACTATE DEHYDROGENASE: LDH: 146 U/L (ref 98–192)

## 2022-05-07 MED ORDER — OXYCODONE HCL 10 MG PO TABS: 10.0000 mg | ORAL_TABLET | Freq: Four times a day (QID) | ORAL | 0 refills | Status: DC | PRN

## 2022-05-07 NOTE — Progress Notes (Signed)
Lindsey [name] to be D/C'd per MD order. Discussed with the patient and all questions fully answered. ? VSS, Skin clean, dry and intact without evidence of skin break down, no evidence of skin tears noted. ? IV catheter discontinued intact. Site without signs and symptoms of complications. Dressing and pressure applied. ? An After Visit Summary was printed and given to the patient. Patient informed where to pickup prescriptions. ? D/c education completed with patient/family including follow up instructions, medication list, d/c activities limitations if indicated, with other d/c instructions as indicated by MD - patient able to verbalize understanding, all questions fully answered.  ? Patient instructed to return to ED, call 911, or call MD for any changes in condition.  ? Patient to be escorted via WC, and D/C home via private auto.  Patient satisfied that all belongings have been returned, including cell phone, purse and jacket.

## 2022-05-07 NOTE — Progress Notes (Signed)
Reviewed PDMP substance reporting system prior to prescribing opiate medications. No inconsistencies noted.  Meds ordered this encounter  Medications   Oxycodone HCl 10 MG TABS    Sig: Take 1 tablet (10 mg total) by mouth every 6 (six) hours as needed for up to 15 days (for moderate to severe pain).    Dispense:  60 tablet    Refill:  0    Order Specific Question:   Supervising Provider    Answer:   Tresa Garter [id number]   [name]  APRN, MSN, FNP-C Patient [place] [address] [place], [place] [address] [phone]

## 2022-05-07 NOTE — Discharge Summary (Signed)
Physician Discharge Summary  Lindsey [name] [id number]:[id number] DOB: 1971-03-01 DOA: 05/03/2022  PCP: Massie Maroon, FNP  Admit date: 05/03/2022  Discharge date: 05/07/2022  Discharge Diagnoses:  Active Problems:   Chronic pain   Sickle cell anemia with crisis [facility])   Discharge Condition: Stable  Disposition:   Follow-up Information     Massie Maroon, FNP Follow up.   Specialty: Family Medicine Contact information: [address] [place] [address] [phone]                Pt is discharged home in good condition and is to follow up with Massie Maroon, FNP this week to have labs evaluated. Lindsey [name] is instructed to increase activity slowly and balance with rest for the next few days, and use prescribed medication to complete treatment of pain  Diet: Regular Wt Readings from Last 3 Encounters:  04/04/22 61.2 kg  03/28/22 66.7 kg  02/11/22 63.5 kg    History of present illness:  Lindsey [name] is a 51 year old female with a medical history significant for sickle cell disease, chronic pain syndrome, opiate dependence and tolerance, and anemia of chronic disease presented with typical sickle cell pain crisis pain.  Pain primarily to legs and back, which started from 1 day prior.  No fever, chest pain, or shortness of breath.  Patient takes folic acid and OxyContin at home twice daily.  Patient also takes oxycodone 10 mg every 6 hours for pain and has been taking consistently.  Patient does not smoke or drink alcohol. While in ER: Started management for sickle cell pain crisis with Dilaudid IV x 3 doses with no improvement in the ER.  But still states that her pain is uncontrolled.  Patient admitted for management of sickle cell pain crisis.  Hospital Course:  Sickle cell disease with pain crisis: Patient was admitted for sickle cell pain crisis and managed appropriately with IVF, IV Dilaudid via PCA and IV Toradol, as well as  other adjunct therapies per sickle cell pain management protocols.  Patient states that pain intensity is 3/10 primarily to lower extremities.  She states that she can manage at home on current medication regimen.  Patient states that pain medications are due to be filled on Tuesdays.  Oxycodone 10 mg #60 was sent to patient's pharmacy and can be filled on 05/10/2022.  Reviewed PDMP prior to prescribing opiate medications, no inconsistencies noted. Patient alert, oriented, and ambulating without assistance.  Patient was therefore discharged home today in a hemodynamically stable condition.   Lindsey [name] will follow-up with PCP within 1 week of this discharge. Lindsey [name] was counseled extensively about nonpharmacologic means of pain management, patient verbalized understanding and was appreciative of  the care received during this admission.   We discussed the need for good hydration, monitoring of hydration status, avoidance of heat, cold, stress, and infection triggers. We discussed the need to be adherent with taking home medications. Patient was reminded of the need to seek medical attention immediately if any symptom of bleeding, anemia, or infection occurs.  Discharge Exam: Vitals:   05/07/22 0759 05/07/22 1123  BP:    Pulse:    Resp: 15 15  Temp:    SpO2: 98% 98%   Vitals:   05/07/22 0456 05/07/22 0549 05/07/22 0759 05/07/22 1123  BP:  108/70    Pulse:  (!) 54    Resp: 19 18 15 15   Temp:  98 F (36.7 C)    TempSrc:  Oral    SpO2: 99% 100% 98% 98%    General appearance : Awake, alert, not in any distress. Speech Clear. Not toxic looking HEENT: Atraumatic and Normocephalic, pupils equally reactive to light and accomodation Neck: Supple, no JVD. No cervical lymphadenopathy.  Chest: Good air entry bilaterally, no added sounds  CVS: S1 S2 regular, no murmurs.  Abdomen: Bowel sounds present, Non tender and not distended with no gaurding, rigidity or rebound. Extremities: B/L Lower Ext  shows no edema, both legs are warm to touch Neurology: Awake alert, and oriented X 3, CN II-XII intact, Non focal Skin: No Rash  Discharge Instructions  Discharge Instructions     Discharge patient   Complete by: As directed    Discharge disposition: 01-Home or Self Care   Discharge patient date: 05/07/2022      Allergies as of 05/07/2022       Reactions   Ketamine Anxiety, Other (See Comments)   Tachycardia        Medication List     TAKE these medications    ALPRAZolam 0.25 MG tablet Commonly known as: XANAX Take 1 tablet (0.25 mg total) by mouth 2 (two) times daily as needed for anxiety.   Benadryl Allergy 25 MG tablet Generic drug: diphenhydrAMINE Take 50 mg by mouth at bedtime as needed for sleep.   carboxymethylcellulose 0.5 % Soln Commonly known as: REFRESH PLUS Place 1 drop into both eyes daily as needed (dry eyes).   folic acid 1 MG tablet Commonly known as: FOLVITE Take 1 tablet (1 mg total) by mouth daily.   gabapentin 400 MG capsule Commonly known as: NEURONTIN Take 1 capsule (400 mg total) by mouth 3 (three) times daily.   ibuprofen 200 MG tablet Commonly known as: ADVIL Take 400 mg by mouth every 6 (six) hours as needed for mild pain.   oxyCODONE 10 mg 12 hr tablet Commonly known as: OXYCONTIN Take 1 tablet (10 mg total) by mouth every 12 (twelve) hours. What changed: Another medication with the same name was changed. Make sure you understand how and when to take each.   Oxycodone HCl 10 MG Tabs Take 1 tablet (10 mg total) by mouth every 6 (six) hours as needed for up to 15 days (for moderate to severe pain). Start taking on: May 10, 2022 What changed: These instructions start on May 10, 2022. If you are unsure what to do until then, ask your doctor or other care provider.        The results of significant diagnostics from this hospitalization (including imaging, microbiology, ancillary and laboratory) are listed below for  reference.    Significant Diagnostic Studies: No results found.  Microbiology: No results found for this or any previous visit (from the past 240 hour(s)).   Labs: Basic Metabolic Panel: Recent Labs  Lab 05/03/22 1509 05/03/22 1529 05/04/22 0858  NA 142  --  139  K 3.7  --  3.7  CL 111  --  109  CO2 24  --  25  GLUCOSE 96  --  102*  BUN 10  --  12  CREATININE 0.81  --  0.97  CALCIUM 9.3  --  8.7*  MG  --  2.0  --   PHOS  --  3.0  --    Liver Function Tests: Recent Labs  Lab 05/03/22 1509 05/04/22 0858  AST 20 19  ALT 16 14  ALKPHOS 41 34*  BILITOT 1.3* 1.2  PROT 7.3 7.3  ALBUMIN 4.3  4.2   No results for input(s): "LIPASE", "AMYLASE" in the last 168 hours. No results for input(s): "AMMONIA" in the last 168 hours. CBC: Recent Labs  Lab 05/03/22 1509 05/04/22 0858 05/05/22 0750 05/06/22 0648 05/07/22 0802  WBC 9.5 8.8 8.6 8.2 8.8  NEUTROABS 4.6  --   --   --   --   HGB 11.2* 9.5* 8.5* 8.4* 8.9*  HCT 30.2* 26.1* 23.3* 23.0* 24.6*  MCV 80.7 81.8 81.2 81.9 81.5  PLT 393 338 270 256 266   Cardiac Enzymes: Recent Labs  Lab 05/03/22 1529  CKTOTAL 43   BNP: Invalid input(s): "POCBNP" CBG: Recent Labs  Lab 05/04/22 0011 05/04/22 0455 05/04/22 0744 05/04/22 1147 05/04/22 1605  GLUCAP 101* 105* 100* 108* 138*    Time coordinating discharge: 30 minutes  Signed:  [name]  APRN, MSN, FNP-C Patient Care [facility] [address] [place], [place] [address] [phone]  [facility] 05/07/2022, 3:21 PM

## 2022-05-10 ENCOUNTER — Other Ambulatory Visit: Payer: Self-pay | Admitting: Family Medicine

## 2022-05-10 DIAGNOSIS — G894 Chronic pain syndrome: Secondary | ICD-10-CM

## 2022-05-10 DIAGNOSIS — D5700 Hb-SS disease with crisis, unspecified: Secondary | ICD-10-CM

## 2022-05-10 MED ORDER — OXYCODONE HCL 10 MG PO TABS: 10.0000 mg | ORAL_TABLET | Freq: Four times a day (QID) | ORAL | 0 refills | Status: DC | PRN

## 2022-05-10 NOTE — Progress Notes (Signed)
Reviewed PDMP substance reporting system prior to prescribing opiate medications. No inconsistencies noted.  Meds ordered this encounter  Medications   Oxycodone HCl 10 MG TABS    Sig: Take 1 tablet (10 mg total) by mouth every 6 (six) hours as needed for up to 15 days (for moderate to severe pain).    Dispense:  60 tablet    Refill:  0    Order Specific Question:   Supervising Provider    Answer:   Tresa Garter [id number]   [name]  APRN, MSN, FNP-C Patient [place] [address] [place], [place] [address] [phone]

## 2022-05-20 ENCOUNTER — Emergency Department (HOSPITAL_COMMUNITY): Payer: Medicaid Other

## 2022-05-20 ENCOUNTER — Encounter (HOSPITAL_COMMUNITY): Payer: Self-pay

## 2022-05-20 ENCOUNTER — Other Ambulatory Visit: Payer: Self-pay

## 2022-05-20 ENCOUNTER — Inpatient Hospital Stay (HOSPITAL_COMMUNITY)
Admission: EM | Admit: 2022-05-20 | Discharge: 2022-05-23 | DRG: 812 | Disposition: A | Discharge status: Home / Self Care | Payer: Medicaid Other | Attending: Internal Medicine | Admitting: Internal Medicine

## 2022-05-20 DIAGNOSIS — Z79899 Other long term (current) drug therapy: Secondary | ICD-10-CM

## 2022-05-20 DIAGNOSIS — R52 Pain, unspecified: Secondary | ICD-10-CM

## 2022-05-20 DIAGNOSIS — F1121 Opioid dependence, in remission: Secondary | ICD-10-CM | POA: Diagnosis present

## 2022-05-20 DIAGNOSIS — F17210 Nicotine dependence, cigarettes, uncomplicated: Secondary | ICD-10-CM | POA: Diagnosis present

## 2022-05-20 DIAGNOSIS — F411 Generalized anxiety disorder: Secondary | ICD-10-CM | POA: Diagnosis present

## 2022-05-20 DIAGNOSIS — Z888 Allergy status to other drugs, medicaments and biological substances status: Secondary | ICD-10-CM | POA: Diagnosis not present

## 2022-05-20 DIAGNOSIS — D638 Anemia in other chronic diseases classified elsewhere: Secondary | ICD-10-CM | POA: Diagnosis present

## 2022-05-20 DIAGNOSIS — D5700 Hb-SS disease with crisis, unspecified: Secondary | ICD-10-CM | POA: Diagnosis present

## 2022-05-20 DIAGNOSIS — G894 Chronic pain syndrome: Secondary | ICD-10-CM | POA: Diagnosis present

## 2022-05-20 DIAGNOSIS — G8929 Other chronic pain: Secondary | ICD-10-CM | POA: Diagnosis present

## 2022-05-20 LAB — CBC WITH DIFFERENTIAL/PLATELET
Abs Immature Granulocytes: 0.02 10*3/uL (ref 0.00–0.07)
Abs Immature Granulocytes: 0.03 10*3/uL (ref 0.00–0.07)
Basophils Absolute: 0.1 10*3/uL (ref 0.0–0.1)
Basophils Absolute: 0.1 10*3/uL (ref 0.0–0.1)
Basophils Relative: 1 %
Basophils Relative: 1 %
Eosinophils Absolute: 0.3 10*3/uL (ref 0.0–0.5)
Eosinophils Absolute: 0.4 10*3/uL (ref 0.0–0.5)
Eosinophils Relative: 3 %
Eosinophils Relative: 4 %
HCT: 28.9 % — ABNORMAL LOW (ref 36.0–46.0)
HCT: 27.6 % — ABNORMAL LOW (ref 36.0–46.0)
Hemoglobin: 10.7 g/dL — ABNORMAL LOW (ref 12.0–15.0)
Hemoglobin: 10.3 g/dL — ABNORMAL LOW (ref 12.0–15.0)
Immature Granulocytes: 0 %
Immature Granulocytes: 0 %
Lymphocytes Relative: 39 %
Lymphocytes Relative: 45 %
Lymphs Abs: 3.9 10*3/uL (ref 0.7–4.0)
Lymphs Abs: 4.9 10*3/uL — ABNORMAL HIGH (ref 0.7–4.0)
MCH: 29.6 pg (ref 26.0–34.0)
MCH: 29.9 pg (ref 26.0–34.0)
MCHC: 37 g/dL — ABNORMAL HIGH (ref 30.0–36.0)
MCHC: 37.3 g/dL — ABNORMAL HIGH (ref 30.0–36.0)
MCV: 79.8 fL — ABNORMAL LOW (ref 80.0–100.0)
MCV: 80.2 fL (ref 80.0–100.0)
Monocytes Absolute: 0.5 10*3/uL (ref 0.1–1.0)
Monocytes Absolute: 0.6 10*3/uL (ref 0.1–1.0)
Monocytes Relative: 5 %
Monocytes Relative: 6 %
Neutro Abs: 5.1 10*3/uL (ref 1.7–7.7)
Neutro Abs: 4.7 10*3/uL (ref 1.7–7.7)
Neutrophils Relative %: 52 %
Neutrophils Relative %: 44 %
Platelets: 554 10*3/uL — ABNORMAL HIGH (ref 150–400)
Platelets: 486 10*3/uL — ABNORMAL HIGH (ref 150–400)
RBC: 3.62 MIL/uL — ABNORMAL LOW (ref 3.87–5.11)
RBC: 3.44 MIL/uL — ABNORMAL LOW (ref 3.87–5.11)
RDW: 15.8 % — ABNORMAL HIGH (ref 11.5–15.5)
RDW: 15.8 % — ABNORMAL HIGH (ref 11.5–15.5)
WBC: 10 10*3/uL (ref 4.0–10.5)
WBC: 10.7 10*3/uL — ABNORMAL HIGH (ref 4.0–10.5)
nRBC: 0.2 % (ref 0.0–0.2)
nRBC: 0 % (ref 0.0–0.2)

## 2022-05-20 LAB — COMPREHENSIVE METABOLIC PANEL
ALT: 27 U/L (ref 0–44)
ALT: 26 U/L (ref 0–44)
AST: 19 U/L (ref 15–41)
AST: 18 U/L (ref 15–41)
Albumin: 4.4 g/dL (ref 3.5–5.0)
Albumin: 4.5 g/dL (ref 3.5–5.0)
Alkaline Phosphatase: 51 U/L (ref 38–126)
Alkaline Phosphatase: 50 U/L (ref 38–126)
Anion gap: 9 (ref 5–15)
Anion gap: 7 (ref 5–15)
BUN: 15 mg/dL (ref 6–20)
BUN: 16 mg/dL (ref 6–20)
CO2: 22 mmol/L (ref 22–32)
CO2: 23 mmol/L (ref 22–32)
Calcium: 9.1 mg/dL (ref 8.9–10.3)
Calcium: 9 mg/dL (ref 8.9–10.3)
Chloride: 109 mmol/L (ref 98–111)
Chloride: 109 mmol/L (ref 98–111)
Creatinine, Ser: 0.99 mg/dL (ref 0.44–1.00)
Creatinine, Ser: 0.97 mg/dL (ref 0.44–1.00)
GFR, Estimated: >60 mL/min (ref >60)
GFR, Estimated: >60 mL/min (ref >60)
Glucose, Bld: 99 mg/dL (ref 70–99)
Glucose, Bld: 102 mg/dL — ABNORMAL HIGH (ref 70–99)
Potassium: 4.3 mmol/L (ref 3.5–5.1)
Potassium: 4.4 mmol/L (ref 3.5–5.1)
Sodium: 140 mmol/L (ref 135–145)
Sodium: 139 mmol/L (ref 135–145)
Total Bilirubin: 1 mg/dL (ref 0.3–1.2)
Total Bilirubin: 1.1 mg/dL (ref 0.3–1.2)
Total Protein: 7.6 g/dL (ref 6.5–8.1)
Total Protein: 7.7 g/dL (ref 6.5–8.1)

## 2022-05-20 LAB — I-STAT BETA HCG BLOOD, ED (MC, WL, AP ONLY): I-stat hCG, quantitative: <5 m[IU]/mL (ref <5)

## 2022-05-20 LAB — CBC
HCT: 27.3 % — ABNORMAL LOW (ref 36.0–46.0)
Hemoglobin: 9.9 g/dL — ABNORMAL LOW (ref 12.0–15.0)
MCH: 29.6 pg (ref 26.0–34.0)
MCHC: 36.3 g/dL — ABNORMAL HIGH (ref 30.0–36.0)
MCV: 81.5 fL (ref 80.0–100.0)
Platelets: 353 10*3/uL (ref 150–400)
RBC: 3.35 MIL/uL — ABNORMAL LOW (ref 3.87–5.11)
RDW: 16.3 % — ABNORMAL HIGH (ref 11.5–15.5)
WBC: 13 10*3/uL — ABNORMAL HIGH (ref 4.0–10.5)
nRBC: 0.2 % (ref 0.0–0.2)

## 2022-05-20 LAB — TYPE AND SCREEN
ABO/RH(D): A POS
Antibody Screen: NEGATIVE

## 2022-05-20 LAB — RETICULOCYTES
Immature Retic Fract: 17.5 % — ABNORMAL HIGH (ref 2.3–15.9)
RBC.: 3.62 MIL/uL — ABNORMAL LOW (ref 3.87–5.11)
Retic Count, Absolute: 55.4 10*3/uL (ref 19.0–186.0)
Retic Ct Pct: 1.5 % (ref 0.4–3.1)

## 2022-05-20 LAB — APTT: aPTT: 20 seconds — ABNORMAL LOW (ref 24–36)

## 2022-05-20 LAB — PROTIME-INR
INR: 1.2 (ref 0.8–1.2)
Prothrombin Time: 15 seconds (ref 11.4–15.2)

## 2022-05-20 LAB — MAGNESIUM: Magnesium: 2.4 mg/dL (ref 1.7–2.4)

## 2022-05-20 MED ORDER — HYDROMORPHONE HCL 2 MG/ML IJ SOLN: 2.0000 mg | INTRAMUSCULAR | Status: DC | PRN

## 2022-05-20 MED ORDER — POLYETHYLENE GLYCOL 3350 17 G PO PACK: 17.0000 g | PACK | Freq: Every day | ORAL | Status: DC | PRN

## 2022-05-20 MED ORDER — ONDANSETRON HCL 4 MG/2ML IJ SOLN
4.0000 mg | Freq: Once | INTRAMUSCULAR | Status: AC
  Administered 2022-05-20: 4 mg via INTRAVENOUS
  Filled 2022-05-20: qty 2

## 2022-05-20 MED ORDER — FOLIC ACID 1 MG PO TABS
1.0000 mg | ORAL_TABLET | Freq: Every day | ORAL | Status: DC
  Administered 2022-05-20 – 2022-05-23 (×4): 1 mg via ORAL
  Filled 2022-05-20 (×4): qty 1

## 2022-05-20 MED ORDER — KETOROLAC TROMETHAMINE 15 MG/ML IJ SOLN
15.0000 mg | Freq: Four times a day (QID) | INTRAMUSCULAR | Status: DC | PRN
  Administered 2022-05-20: 15 mg via INTRAVENOUS
  Filled 2022-05-20: qty 1

## 2022-05-20 MED ORDER — HYDROMORPHONE HCL 2 MG/ML IJ SOLN
2.0000 mg | INTRAMUSCULAR | Status: DC | PRN
  Administered 2022-05-20 (×3): 2 mg via INTRAVENOUS
  Filled 2022-05-20 (×3): qty 1

## 2022-05-20 MED ORDER — ACETAMINOPHEN 650 MG RE SUPP: 650.0000 mg | Freq: Four times a day (QID) | RECTAL | Status: DC | PRN

## 2022-05-20 MED ORDER — ENOXAPARIN SODIUM 40 MG/0.4ML IJ SOSY
40.0000 mg | PREFILLED_SYRINGE | INTRAMUSCULAR | Status: DC
  Administered 2022-05-20 – 2022-05-21 (×2): 40 mg via SUBCUTANEOUS
  Filled 2022-05-20 (×3): qty 0.4

## 2022-05-20 MED ORDER — NALOXONE HCL 0.4 MG/ML IJ SOLN: 0.4000 mg | INTRAMUSCULAR | Status: DC | PRN

## 2022-05-20 MED ORDER — OXYCODONE HCL ER 10 MG PO T12A
10.0000 mg | EXTENDED_RELEASE_TABLET | Freq: Two times a day (BID) | ORAL | Status: DC
  Administered 2022-05-20 – 2022-05-23 (×6): 10 mg via ORAL
  Filled 2022-05-20 (×7): qty 1

## 2022-05-20 MED ORDER — DIPHENHYDRAMINE HCL 25 MG PO CAPS
25.0000 mg | ORAL_CAPSULE | ORAL | Status: DC | PRN
  Administered 2022-05-23: 25 mg via ORAL
  Filled 2022-05-20: qty 1

## 2022-05-20 MED ORDER — HYDROMORPHONE HCL 2 MG/ML IJ SOLN
2.0000 mg | INTRAMUSCULAR | Status: AC
  Administered 2022-05-20: 2 mg via SUBCUTANEOUS
  Filled 2022-05-20: qty 1

## 2022-05-20 MED ORDER — ONDANSETRON HCL 4 MG/2ML IJ SOLN: 4.0000 mg | Freq: Four times a day (QID) | INTRAMUSCULAR | Status: DC | PRN

## 2022-05-20 MED ORDER — SODIUM CHLORIDE 0.9% FLUSH: 9.0000 mL | INTRAVENOUS | Status: DC | PRN

## 2022-05-20 MED ORDER — SODIUM CHLORIDE 0.9 % IV BOLUS
500.0000 mL | Freq: Once | INTRAVENOUS | Status: AC
  Administered 2022-05-20: 500 mL via INTRAVENOUS

## 2022-05-20 MED ORDER — DIPHENHYDRAMINE HCL 25 MG PO CAPS
25.0000 mg | ORAL_CAPSULE | ORAL | Status: DC | PRN
  Filled 2022-05-20: qty 1

## 2022-05-20 MED ORDER — SENNOSIDES-DOCUSATE SODIUM 8.6-50 MG PO TABS
1.0000 | ORAL_TABLET | Freq: Two times a day (BID) | ORAL | Status: DC
  Administered 2022-05-20 – 2022-05-22 (×4): 1 via ORAL
  Filled 2022-05-20 (×7): qty 1

## 2022-05-20 MED ORDER — ENOXAPARIN SODIUM 40 MG/0.4ML IJ SOSY: 40.0000 mg | PREFILLED_SYRINGE | INTRAMUSCULAR | Status: DC

## 2022-05-20 MED ORDER — HYDROMORPHONE 1 MG/ML IV SOLN
INTRAVENOUS | Status: DC
  Administered 2022-05-20: 3.5 mg via INTRAVENOUS
  Administered 2022-05-20: 30 mg via INTRAVENOUS
  Administered 2022-05-20 – 2022-05-21 (×2): 2.5 mg via INTRAVENOUS
  Administered 2022-05-21 (×2): 3.5 mg via INTRAVENOUS
  Administered 2022-05-21: 1.5 mg via INTRAVENOUS
  Administered 2022-05-21: 5.5 mg via INTRAVENOUS
  Administered 2022-05-21: 30 mg via INTRAVENOUS
  Administered 2022-05-22 (×2): 4 mg via INTRAVENOUS
  Administered 2022-05-22: 3.5 mg via INTRAVENOUS
  Administered 2022-05-22: 3 mg via INTRAVENOUS
  Administered 2022-05-22: 4.5 mg via INTRAVENOUS
  Administered 2022-05-22: 4 mg via INTRAVENOUS
  Administered 2022-05-22: 2 mg via INTRAVENOUS
  Administered 2022-05-23: 30 mg via INTRAVENOUS
  Administered 2022-05-23 (×2): 2.5 mg via INTRAVENOUS
  Filled 2022-05-20 (×3): qty 30

## 2022-05-20 MED ORDER — ACETAMINOPHEN 325 MG PO TABS: 650.0000 mg | ORAL_TABLET | Freq: Four times a day (QID) | ORAL | Status: DC | PRN

## 2022-05-20 MED ORDER — ONDANSETRON HCL 4 MG/2ML IJ SOLN
4.0000 mg | Freq: Four times a day (QID) | INTRAMUSCULAR | Status: DC | PRN
  Administered 2022-05-20: 4 mg via INTRAVENOUS
  Filled 2022-05-20: qty 2

## 2022-05-20 MED ORDER — KETOROLAC TROMETHAMINE 15 MG/ML IJ SOLN
15.0000 mg | Freq: Four times a day (QID) | INTRAMUSCULAR | Status: DC
  Administered 2022-05-20 – 2022-05-23 (×12): 15 mg via INTRAVENOUS
  Filled 2022-05-20 (×13): qty 1

## 2022-05-20 MED ORDER — ALPRAZOLAM 0.25 MG PO TABS
0.2500 mg | ORAL_TABLET | Freq: Once | ORAL | Status: AC
  Administered 2022-05-20: 0.25 mg via ORAL
  Filled 2022-05-20 (×2): qty 1

## 2022-05-20 MED ORDER — GABAPENTIN 400 MG PO CAPS
400.0000 mg | ORAL_CAPSULE | Freq: Three times a day (TID) | ORAL | Status: DC
  Administered 2022-05-20 – 2022-05-23 (×10): 400 mg via ORAL
  Filled 2022-05-20 (×10): qty 1

## 2022-05-20 NOTE — TOC Initial Note (Signed)
Transition of Care [facility]) - Initial/Assessment Note    Patient Details  Name: Lindsey [name] MRN: [id number] Date of Birth: [birth year]  Transition of Care [facility]) CM/SW Contact:    Angelita Ingles, RN Phone Number:[phone]  05/20/2022, 3:47 PM  Clinical Narrative:                  Transition of Care [facility]) Screening Note   Patient Details  Name: Lindsey [name] Date of Birth: 03-Mar-1971   Transition of Care [facility]) CM/SW Contact:    Angelita Ingles, RN Phone Number: 05/20/2022, 3:47 PM    Transition of Care Department [facility]) has reviewed patient and no TOC needs have been identified at this time. We will continue to monitor patient advancement through interdisciplinary progression rounds. If new patient transition needs arise, please place a TOC consult.          Patient Goals and CMS Choice            Expected Discharge Plan and Services                                              Prior Living Arrangements/Services                       Activities of Daily Living Home Assistive Devices/Equipment: None ADL Screening (condition at time of admission) Patient's cognitive ability adequate to safely complete daily activities?: Yes Is the patient deaf or have difficulty hearing?: No Does the patient have difficulty seeing, even when wearing glasses/contacts?: No Does the patient have difficulty concentrating, remembering, or making decisions?: No Patient able to express need for assistance with ADLs?: No Does the patient have difficulty dressing or bathing?: No Independently performs ADLs?: Yes (appropriate for developmental age) Does the patient have difficulty walking or climbing stairs?: No Weakness of Legs: None Weakness of Arms/Hands: None  Permission Sought/Granted                  Emotional Assessment              Admission diagnosis:  Intractable pain [R52] Sickle cell pain crisis ([place]) [D57.00] Patient Active  Problem List   Diagnosis Date Noted   Sickle cell anemia with crisis ([place]) 05/03/2022   Acute low back pain 03/25/2022   GAD (generalized anxiety disorder) 03/25/2022   Sickle cell pain crisis ([place]) 02/11/2022   Acute sickle cell crisis ([place]) 01/28/2022   Anxiety and depression 12/29/2021   Anemia of chronic disease 10/29/2021   Sickle cell anemia ([place]) 09/10/2019   Sickle cell disease ([place]) 10/01/2017   Chronic pain 10/01/2017   Chronic narcotic use 09/25/2017   PCP:  Dorena Dew, FNP Pharmacy:   [facility] [place], [place] AT [address] [place] [address] Phone: [phone] Fax: [phone]  [facility] [place] ([place]), [place] [address] [place] [address] Phone: [phone] Fax: [phone]     Social Determinants of Health (SDOH) Social History: SDOH Screenings   Food Insecurity: No Food Insecurity (05/20/2022)  Housing: Low Risk  (05/20/2022)  Transportation Needs: No Transportation Needs (05/20/2022)  Utilities: Not At Risk (05/20/2022)  Recent Concern: Utilities - At Risk (04/06/2022)  Depression (PHQ2-9): High Risk (  12/14/2021)  Tobacco Use: High Risk (05/20/2022)   SDOH Interventions:     Readmission Risk Interventions    02/14/2022   10:02 AM 11/30/2021   10:59 AM 10/29/2021    1:04 PM  Readmission Risk Prevention Plan  Transportation Screening Complete Complete Complete  PCP or Specialist Appt within 3-5 Days   Complete  HRI or [place]   Complete  Social Work Consult for [place] Planning/Counseling   Complete  Palliative Care Screening   Not Applicable  Medication Review [employer]) Complete Complete Complete  PCP or Specialist appointment within 3-5 days of discharge Complete Complete   HRI or Home Care Consult Complete Complete   SW Recovery Care/Counseling Consult Complete Complete    Palliative Care Screening Not Applicable Not [place] Not Applicable Not Applicable

## 2022-05-20 NOTE — Progress Notes (Signed)
  Carryover admission to the Day Admitter.  I discussed this case with the EDP, Dr. Wynona Dove.  Per these discussions:   This is a 52 year old female with history of sickle cell disease and recurrent hospitalizations for acute sickle cell pain crises who is being admitted with acute sickle cell pain crisis after presenting with 1 to 2 days of crampy pain in her bilateral lower extremities, similar to that which she typically experiences with prior acute sickle cell pain crisis.  Not associate with any chest pain.  Poor pain control following 3 doses of Dilaudid in the emergency department this evening.  Chest x-ray shows no evidence of acute cardiopulmonary process.  I have placed an order for observation to med/tele for further evaluation management of the above.  I have placed some additional preliminary admit orders via the adult multi-morbid admission order set. I have also ordered Dilaudid 2 mg IV every 30 minutes as needed for severe pain x8 additional doses, prn IV Toradol, prn IV Zofran.  Also continued existing order for prn Benadryl for pruritus, which placing orders for prn Narcan, in addition order for  type and screen associated with sickle cell protocol.  Also, placed orders for morning labs in the form of CMP, CBC, and serum magnesium level.    Babs Bertin, DO Hospitalist

## 2022-05-20 NOTE — H&P (Signed)
H&P  Patient Demographics:  Lindsey [name], is a 52 y.o. female  MRN: [id number]   DOB - 1970/09/06  Admit Date - 05/20/2022  Outpatient Primary MD for the patient is Massie Maroon, FNP  Chief Complaint  Patient presents with   Sickle Cell Pain Crisis      HPI:   Lindsey [name]  is a 52 y.o. female with a medical history significant for sickle cell disease, chronic pain syndrome, opiate dependence and tolerance, frequent hospitalizations, and history of anemia of chronic disease presents to the emergency department with complaints of bilateral lower extremity pain.  Pain intensity has been elevated over the past 2 days and unrelieved by her home medications.  Patient's home medications include OxyContin and oxycodone.  Patient has not identified any provocative factors concerning crisis. Pain intensity is 8/10, constant and throbbing.  No recent falls or injuries.  No fever, chills, or chest pain.  No shortness of breath.  No urinary send nausea, vomiting, or diarrhea.  No recent travel or sick contacts.  ER course: While in ER, patient's vital signs remained stable.  Complete blood count notable for WBCs 10, hemoglobin 10.7, and platelets 554,000.  Comprehensive metabolic panel unremarkable.  Reticulocyte percentage 1.5.  Chest x-ray shows no acute cardiopulmonary process. Sickle cell team notified for further management of sickle cell pain crisis.    Review of systems:  Review of Systems  Constitutional: Negative.   HENT: Negative.    Eyes: Negative.   Respiratory: Negative.    Cardiovascular: Negative.   Gastrointestinal: Negative.   Genitourinary: Negative.   Musculoskeletal:  Positive for back pain and joint pain.  Skin: Negative.   Neurological: Negative.   Endo/Heme/Allergies: Negative.   Psychiatric/Behavioral: Negative.      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      Past Surgical History:  Procedure Laterality Date   APPENDECTOMY     CESAREAN SECTION     OTHER SURGICAL HISTORY     c-section     Social History:   Social History   Tobacco Use   Smoking status: Some Days    Packs/day: 0.00    Types: Cigars, Cigarettes   Smokeless tobacco: Never  Substance Use Topics   Alcohol use: Yes    Comment: occasionally     Lives - At home   Family History :   Family History  Problem Relation Age of Onset   Stroke Neg Hx        none that she knows of    Seizures Neg Hx      Home Medications:   Prior to Admission medications   Medication Sig Start Date End Date Taking? Authorizing Provider  ALPRAZolam (XANAX) 0.25 MG tablet Take 1 tablet (0.25 mg total) by mouth 2 (two) times daily as needed for anxiety. Patient not taking: Reported on 03/25/2022 12/31/21   Rometta Emery, MD  carboxymethylcellulose (REFRESH PLUS) 0.5 % SOLN Place 1 drop into both eyes daily as needed (dry eyes).    [provider]  diphenhydrAMINE (BENADRYL ALLERGY)  25 MG tablet Take 50 mg by mouth at bedtime as needed for sleep.    [provider]  folic acid (FOLVITE) 1 MG tablet Take 1 tablet (1 mg total) by mouth daily. 12/31/21 12/31/22  Elwyn Reach, MD  gabapentin (NEURONTIN) 400 MG capsule Take 1 capsule (400 mg total) by mouth 3 (three) times daily. 04/07/22   Dorena Dew, FNP  ibuprofen (ADVIL) 200 MG tablet Take 400 mg by mouth every 6 (six) hours as needed for mild pain.    [provider]  oxyCODONE (OXYCONTIN) 10 mg 12 hr tablet Take 1 tablet (10 mg total) by mouth every 12 (twelve) hours. 04/18/22   Dorena Dew, FNP  Oxycodone HCl 10 MG TABS  Take 1 tablet (10 mg total) by mouth every 6 (six) hours as needed for up to 15 days (for moderate to severe pain). 05/10/22 05/25/22  Dorena Dew, FNP     Allergies:   Allergies  Allergen Reactions   Ketamine Anxiety and Other (See Comments)    Tachycardia     Physical Exam:   Vitals:   Vitals:   05/20/22 0906 05/20/22 1148  BP:  (!) 81/61  Pulse:  94  Resp:  16  Temp: 98 F (36.7 C) 97.8 F (36.6 C)  SpO2:  95%    Physical Exam: Constitutional: Patient appears well-developed and well-nourished. Not in obvious distress. HENT: Normocephalic, atraumatic, External right and left ear normal. Oropharynx is clear and moist.  Eyes: Conjunctivae and EOM are normal. PERRLA, no scleral icterus. Neck: Normal ROM. Neck supple. No JVD. No tracheal deviation. No thyromegaly. CVS: RRR, S1/S2 +, no murmurs, no gallops, no carotid bruit.  Pulmonary: Effort and breath sounds normal, no stridor, rhonchi, wheezes, rales.  Abdominal: Soft. BS +, no distension, tenderness, rebound or guarding.  Musculoskeletal: Normal range of motion. No edema and no tenderness.  Lymphadenopathy: No lymphadenopathy noted, cervical, inguinal or axillary Neuro: Alert. Normal reflexes, muscle tone coordination. No cranial nerve deficit. Skin: Skin is warm and dry. No rash noted. Not diaphoretic. No erythema. No pallor. Psychiatric: Normal mood and affect. Behavior, judgment, thought content normal.   Data Review:   CBC Recent Labs  Lab 05/20/22 0241 05/20/22 0453 05/20/22 1030  WBC 10.0 10.7* 13.0*  HGB 10.7* 10.3* 9.9*  HCT 28.9* 27.6* 27.3*  PLT 554* 486* 353  MCV 79.8* 80.2 81.5  MCH 29.6 29.9 29.6  MCHC 37.0* 37.3* 36.3*  RDW 15.8* 15.8* 16.3*  LYMPHSABS 3.9 4.9*  --   MONOABS 0.5 0.6  --   EOSABS 0.3 0.4  --   BASOSABS 0.1 0.1  --    ------------------------------------------------------------------------------------------------------------------  Chemistries  Recent Labs  Lab  05/20/22 0241 05/20/22 0453  NA 140 139  K 4.3 4.4  CL 109 109  CO2 22 23  GLUCOSE 99 102*  BUN 15 16  CREATININE 0.99 0.97  CALCIUM 9.1 9.0  MG  --  2.4  AST 19 18  ALT 27 26  ALKPHOS 51 50  BILITOT 1.0 1.1   ------------------------------------------------------------------------------------------------------------------ estimated creatinine clearance is 57.7 mL/min (by C-G formula based on SCr of 0.97 mg/dL). ------------------------------------------------------------------------------------------------------------------ No results for input(s): "TSH", "T4TOTAL", "T3FREE", "THYROIDAB" in the last 72 hours.  Invalid input(s): "FREET3"  Coagulation profile Recent Labs  Lab 05/20/22 0241  INR 1.2   ------------------------------------------------------------------------------------------------------------------- No results for input(s): "DDIMER" in the last 72 hours. -------------------------------------------------------------------------------------------------------------------  Cardiac Enzymes No results for input(s): "CKMB", "TROPONINI", "MYOGLOBIN" in the last 168 hours.  Invalid input(s): "CK" ------------------------------------------------------------------------------------------------------------------ No results found for: "BNP"  ---------------------------------------------------------------------------------------------------------------  Urinalysis    Component Value Date/Time   COLORURINE YELLOW 05/31/2021 1350   APPEARANCEUR TURBID (A) 05/31/2021 1350   LABSPEC 1.012 05/31/2021 1350   PHURINE 5.0 05/31/2021 1350   GLUCOSEU NEGATIVE 05/31/2021 1350   HGBUR SMALL (A) 05/31/2021 1350   BILIRUBINUR NEGATIVE 05/31/2021 1350   BILIRUBINUR neg 04/07/2020 1014   KETONESUR NEGATIVE 05/31/2021 1350   PROTEINUR 100 (A) 05/31/2021 1350   UROBILINOGEN 0.2 04/07/2020 1014   UROBILINOGEN 1.0 12/25/2012 1755   NITRITE NEGATIVE 05/31/2021 1350    LEUKOCYTESUR MODERATE (A) 05/31/2021 1350    ----------------------------------------------------------------------------------------------------------------   Imaging Results:    DG Chest Port 1 View  Result Date: 05/20/2022 CLINICAL DATA:  Sickle cell crisis EXAM: PORTABLE CHEST 1 VIEW COMPARISON:  04/04/2022 FINDINGS: The heart size and mediastinal contours are within normal limits. Low lung volumes. Both lungs are clear. No displaced rib fractures. IMPRESSION: No active disease. Electronically Signed   By: Placido Sou M.D.   On: 05/20/2022 02:09     Assessment & Plan:  Principal Problem:   Sickle cell pain crisis ([place]) Active Problems:   Chronic pain   Anemia of chronic disease  Sickle cell disease with pain crisis: Admit.  IV fluids, 0.45% saline at 100 mL/h IV Dilaudid PCA with settings of 0.5 mg, 10-minute lockout, and 3 mg/h Toradol 15 mg every 6 hours Continue OxyContin 10 mg every 12 hours Hold oxycodone, will restart as pain intensity improves Monitor vital signs very closely, reevaluate pain scale regularly, and supplemental oxygen as needed.  Anemia of chronic disease: Patient's hemoglobin is stable and consistent with her baseline.  There is no clinical indication for blood transfusion at this time.  Continue to monitor closely.  Labs in AM.  Chronic pain syndrome: Continue patient's home medications  Generalized anxiety: Stable today.  Patient is no longer on anxiety medications.  No suicidal homicidal ideations on today.  Will continue to monitor closely.  DVT Prophylaxis: Subcut Lovenox   AM Labs Ordered, also please review Full Orders  Family Communication: Admission, patient's condition and plan of care including tests being ordered have been discussed with the patient who indicate understanding and agree with the plan and Code Status.  Code Status: Full Code  Consults called: None    Admission status: Inpatient    Time spent in minutes : 30  minutes  [place], MSN, FNP-C [facility] [address] [place], [place] [address] [phone]  05/20/2022 at 12:17 PM

## 2022-05-20 NOTE — ED Triage Notes (Signed)
Pt reports with [facility] x 2 days. Pt complains of bilateral leg pain.

## 2022-05-20 NOTE — ED Provider Notes (Signed)
[facility] DEPT Provider Note  CSN: [id number] Arrival date & time: 05/20/22 0103  Chief Complaint(s) Sickle Cell Pain Crisis  HPI Lindsey [name] is a 52 y.o. female with past medical history as below, significant for sickle cell disease, chronic opiate use,  who presents to the ED with complaint of leg pain, sickle cell pain crisis. Onset 2 days ago, cramping/throbbing sensation to b/l legs, typical of her usual sickle cell pain. Unrelieved with home opiate. Emesis just pta, mild nausea. No sig abd pain. No fever or chills, no dib, no bleeding noted in stool or urine. No rashes. No recent medication/diet changes, no trauma. 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[place]) 07/17/2021   Patient Active Problem List   Diagnosis Date Noted   Sickle cell anemia with crisis ([place]) 05/03/2022   Acute low back pain 03/25/2022   GAD (generalized anxiety disorder) 03/25/2022   Sickle cell pain crisis ([place]) 02/11/2022   Acute sickle cell crisis ([place]) 01/28/2022   Anxiety and depression 12/29/2021   Anemia of chronic disease 10/29/2021   Sickle cell anemia ([place]) 09/10/2019   Sickle cell disease ([place]) 10/01/2017   Chronic pain 10/01/2017   Chronic narcotic use 09/25/2017   Home Medication(s) Prior to Admission medications   Medication Sig Start Date End Date Taking? Authorizing Provider  ALPRAZolam (XANAX) 0.25 MG tablet Take 1 tablet (0.25 mg total) by mouth 2 (two) times daily as needed for anxiety. Patient not taking: Reported on 03/25/2022 12/31/21   Elwyn Reach, MD  carboxymethylcellulose (REFRESH PLUS) 0.5 % SOLN Place 1 drop into both eyes daily as needed (dry eyes).    [provider]  diphenhydrAMINE (BENADRYL ALLERGY) 25 MG tablet Take 50 mg by mouth at bedtime as needed for sleep.    [provider]  folic acid (FOLVITE) 1 MG tablet Take 1 tablet (1 mg total) by mouth daily. 12/31/21 12/31/22  Elwyn Reach, MD  gabapentin (NEURONTIN) 400 MG capsule Take 1 capsule (400 mg total) by mouth 3 (three) times daily. 04/07/22   Dorena Dew, FNP  ibuprofen (ADVIL) 200 MG tablet Take 400 mg by mouth every 6 (six) hours as needed for mild pain.    [provider]  oxyCODONE (OXYCONTIN) 10 mg 12 hr tablet Take 1 tablet (10 mg total) by mouth every 12 (twelve) hours. 04/18/22   Dorena Dew, FNP  Oxycodone HCl 10 MG TABS Take 1 tablet (10 mg total) by mouth every 6 (six) hours as needed for up to 15 days (for moderate to severe pain). 05/10/22 05/25/22  Dorena Dew, FNP  Past Surgical History Past Surgical History:  Procedure Laterality Date   APPENDECTOMY     CESAREAN SECTION     OTHER SURGICAL HISTORY     c-section   Family History Family History  Problem Relation Age of Onset   Stroke Neg Hx        none that she knows of    Seizures Neg Hx     Social History Social History   Tobacco Use   Smoking status: Some Days    Packs/day: 0.00    Types: Cigars, Cigarettes   Smokeless tobacco: Never  Vaping Use   Vaping Use: Former  Substance Use Topics   Alcohol use: Yes    Comment: occasionally   Drug use: No   Allergies Ketamine  Review of Systems Review of Systems  Constitutional:  Negative for activity change and fever.  HENT:  Negative for facial swelling and trouble swallowing.   Eyes:  Negative for discharge and redness.  Respiratory:  Negative for cough and  shortness of breath.   Cardiovascular:  Negative for chest pain and palpitations.  Gastrointestinal:  Negative for abdominal pain and nausea.  Genitourinary:  Negative for dysuria and flank pain.  Musculoskeletal:  Positive for myalgias. Negative for back pain and gait problem.  Skin:  Negative for pallor and rash.  Neurological:  Negative for syncope and headaches.    Physical Exam Vital Signs  I have reviewed the triage vital signs BP 92/64   Pulse 82   Temp 98.2 F (36.8 C)   Resp 16   Ht 5' (1.524 m)   Wt 63.5 kg   LMP 07/09/2021 (Approximate)   SpO2 91%   BMI 27.34 kg/m  Physical Exam Vitals and nursing note reviewed.  Constitutional:      General: She is not in acute distress.    Appearance: She is not diaphoretic.     Comments: tearful  HENT:     Head: Normocephalic and atraumatic.     Right Ear: External ear normal.     Left Ear: External ear normal.     Nose: Nose normal.     Mouth/Throat:     Mouth: Mucous membranes are moist.  Eyes:     General: No scleral icterus.       Right eye: No discharge.        Left eye: No discharge.  Cardiovascular:     Rate and Rhythm: Normal rate and regular rhythm.     Pulses: Normal pulses.     Heart sounds: Normal heart sounds.  Pulmonary:     Effort: Pulmonary effort is normal. No respiratory distress.     Breath sounds: Normal breath sounds.  Abdominal:     General: Abdomen is flat.     Palpations: Abdomen is soft.     Tenderness: There is no abdominal tenderness. There is no guarding or rebound.  Musculoskeletal:        General: Normal range of motion.     Cervical back: Normal range of motion.     Right lower leg: No edema.     Left lower leg: No edema.     Comments: Compartments are soft to BLLE LE NVI Normal ROM to b/l LE No sig pain with log roll BLLE   Skin:    General: Skin is warm and dry.     Capillary Refill: Capillary refill takes less than 2 seconds.  Neurological:     Mental Status: She is  alert.  Psychiatric:  Mood and Affect: Mood normal. Affect is tearful.        Behavior: Behavior normal.     ED Results and Treatments Labs (all labs ordered are listed, but only abnormal results are displayed) Labs Reviewed  CBC WITH DIFFERENTIAL/PLATELET - Abnormal; Notable for the following components:      Result Value   RBC 3.62 (*)    Hemoglobin 10.7 (*)    HCT 28.9 (*)    MCV 79.8 (*)    MCHC 37.0 (*)    RDW 15.8 (*)    Platelets 554 (*)    All other components within normal limits  RETICULOCYTES - Abnormal; Notable for the following components:   RBC. 3.62 (*)    Immature Retic Fract 17.5 (*)    All other components within normal limits  APTT - Abnormal; Notable for the following components:   aPTT 20 (*)    All other components within normal limits  CBC WITH DIFFERENTIAL/PLATELET - Abnormal; Notable for the following components:   WBC 10.7 (*)    RBC 3.44 (*)    Hemoglobin 10.3 (*)    HCT 27.6 (*)    MCHC 37.3 (*)    RDW 15.8 (*)    Platelets 486 (*)    Lymphs Abs 4.9 (*)    All other components within normal limits  COMPREHENSIVE METABOLIC PANEL - Abnormal; Notable for the following components:   Glucose, Bld 102 (*)    All other components within normal limits  COMPREHENSIVE METABOLIC PANEL  PROTIME-INR  MAGNESIUM  I-STAT BETA HCG BLOOD, ED ([facility], [facility], [facility] ONLY)  TYPE AND SCREEN                                                                                                                          Radiology DG Chest Port 1 View  Result Date: 05/20/2022 CLINICAL DATA:  Sickle cell crisis EXAM: PORTABLE CHEST 1 VIEW COMPARISON:  04/04/2022 FINDINGS: The heart size and mediastinal contours are within normal limits. Low lung volumes. Both lungs are clear. No displaced rib fractures. IMPRESSION: No active disease. Electronically Signed   By: Minerva Fester M.D.   On: 05/20/2022 02:09    Pertinent labs & imaging results that were available during my care  of the patient were reviewed by me and considered in my medical decision making (see MDM for details).  Medications Ordered in ED Medications  diphenhydrAMINE (BENADRYL) capsule 25-50 mg (has no administration in time range)  naloxone [facility]) injection 0.4 mg (has no administration in time range)  ketorolac (TORADOL) 15 MG/ML injection 15 mg (15 mg Intravenous Given 05/20/22 0528)  ondansetron (ZOFRAN) injection 4 mg (has no administration in time range)  HYDROmorphone (DILAUDID) injection 2 mg (2 mg Intravenous Given 05/20/22 0528)  acetaminophen (TYLENOL) tablet 650 mg (has no administration in time range)    Or  acetaminophen (TYLENOL) suppository 650 mg (has no administration in time range)  enoxaparin (LOVENOX) injection 40 mg (has no  administration in time range)  HYDROmorphone (DILAUDID) injection 2 mg (2 mg Subcutaneous Given 05/20/22 0238)  HYDROmorphone (DILAUDID) injection 2 mg (2 mg Subcutaneous Given 05/20/22 0327)  HYDROmorphone (DILAUDID) injection 2 mg (2 mg Subcutaneous Given 05/20/22 0239)  ondansetron (ZOFRAN) injection 4 mg (4 mg Intravenous Given 05/20/22 0324)  sodium chloride 0.9 % bolus 500 mL (500 mLs Intravenous New Bag/Given 05/20/22 0528)                                                                                                                                     Procedures .Critical Care  Performed by: Jeanell Sparrow, DO Authorized by: Jeanell Sparrow, DO   Critical care provider statement:    Critical care time (minutes):  30   Critical care time was exclusive of:  Separately billable procedures and treating other patients   Critical care was necessary to treat or prevent imminent or life-threatening deterioration of the following conditions: intractable pain.   Critical care was time spent personally by me on the following activities:  Development of treatment plan with patient or surrogate, discussions with consultants, evaluation of patient's response to  treatment, examination of patient, ordering and review of laboratory studies, ordering and review of radiographic studies, ordering and performing treatments and interventions, pulse oximetry, re-evaluation of patient's condition, review of old charts and obtaining history from patient or surrogate   Care discussed with: admitting provider     (including critical care time)  Medical Decision Making / ED Course   MDM:  Taisley Mordan is a 52 y.o. female with past medical history as below, significant for sickle cell disease, chronic opiate use,  who presents to the ED with complaint of leg pain, sickle cell pain crisis.. The complaint involves an extensive differential diagnosis and also carries with it a high risk of complications and morbidity.  Serious etiology was considered. Ddx includes but is not limited to: sickle cell pain crisis, vaso occlusive crisis, electrolyte derangement, dehydration, etc  On initial assessment the patient is: tearful, nad, on room air, afebrile Vital signs and nursing notes were reviewed    Labs reviewed and stable, cxr neg, not hypoxic of febrile Minimal relief after 6mg  dilaudid Recommend admission for intractable pain/ sickle cell pain crisis Spoke with [facility] who accepts pt for admission  Additional history obtained: -Additional history obtained from na -External records from outside source obtained and reviewed including: Chart review including previous notes, labs, imaging, consultation notes including oprior ed visits/labs/imaging/home meds   Lab Tests: -I ordered, reviewed, and interpreted labs.   The pertinent results include:   Labs Reviewed  CBC WITH DIFFERENTIAL/PLATELET - Abnormal; Notable for the following components:      Result Value   RBC 3.62 (*)    Hemoglobin 10.7 (*)    HCT 28.9 (*)    MCV 79.8 (*)    MCHC 37.0 (*)    RDW  15.8 (*)    Platelets 554 (*)    All other components within normal limits  RETICULOCYTES - Abnormal;  Notable for the following components:   RBC. 3.62 (*)    Immature Retic Fract 17.5 (*)    All other components within normal limits  APTT - Abnormal; Notable for the following components:   aPTT 20 (*)    All other components within normal limits  CBC WITH DIFFERENTIAL/PLATELET - Abnormal; Notable for the following components:   WBC 10.7 (*)    RBC 3.44 (*)    Hemoglobin 10.3 (*)    HCT 27.6 (*)    MCHC 37.3 (*)    RDW 15.8 (*)    Platelets 486 (*)    Lymphs Abs 4.9 (*)    All other components within normal limits  COMPREHENSIVE METABOLIC PANEL - Abnormal; Notable for the following components:   Glucose, Bld 102 (*)    All other components within normal limits  COMPREHENSIVE METABOLIC PANEL  PROTIME-INR  MAGNESIUM  I-STAT BETA HCG BLOOD, ED ([facility], [facility], [facility] ONLY)  TYPE AND SCREEN    Notable for similar to prev  EKG   EKG Interpretation  Date/Time:    Ventricular Rate:    PR Interval:    QRS Duration:   QT Interval:    QTC Calculation:   R Axis:     Text Interpretation:           Imaging Studies ordered: I ordered imaging studies including cxr I independently visualized the following imaging with scope of interpretation limited to determining acute life threatening conditions related to emergency care: cxr, which revealed no acute process I independently visualized and interpreted imaging. I agree with the radiologist interpretation   Medicines ordered and prescription drug management: Meds ordered this encounter  Medications   HYDROmorphone (DILAUDID) injection 2 mg   HYDROmorphone (DILAUDID) injection 2 mg   HYDROmorphone (DILAUDID) injection 2 mg   diphenhydrAMINE (BENADRYL) capsule 25-50 mg   ondansetron (ZOFRAN) injection 4 mg   naloxone (NARCAN) injection 0.4 mg   DISCONTD: HYDROmorphone (DILAUDID) injection 2 mg   ketorolac (TORADOL) 15 MG/ML injection 15 mg   ondansetron (ZOFRAN) injection 4 mg   HYDROmorphone (DILAUDID) injection 2 mg   OR Linked  Order Group    acetaminophen (TYLENOL) tablet 650 mg    acetaminophen (TYLENOL) suppository 650 mg   enoxaparin (LOVENOX) injection 40 mg   sodium chloride 0.9 % bolus 500 mL    -I have reviewed the patients home medicines and have made adjustments as needed   Consultations Obtained: I requested consultation with the na,  and discussed lab and imaging findings as well as pertinent plan - they recommend: na   Cardiac Monitoring: The patient was maintained on a cardiac monitor.  I personally viewed and interpreted the cardiac monitored which showed an underlying rhythm of: NSR  Social Determinants of Health:  Diagnosis or treatment significantly limited by social determinants of health: chronic opiates    Reevaluation: After the interventions noted above, I reevaluated the patient and found that they have stayed the same  Co morbidities that complicate the patient evaluation  Past Medical History:  Diagnosis Date   Acute kidney injury ([facility]) 01/18/2021   Acute pain of left shoulder    Cough 06/27/2020   Hypokalemia 06/27/2020   Hypoxia    Leukocytosis 09/03/2016   Opiate abuse, episodic ([facility]) 09/25/2017   Opioid dependence in remission ([facility])    Right leg pain  Sickle cell crisis ([facility])    Vasoocclusive sickle cell crisis ([facility]) 07/17/2021      Dispostion: Disposition decision including need for hospitalization was considered, and patient admitted to the hospital.    Final Clinical Impression(s) / ED Diagnoses Final diagnoses:  Sickle cell pain crisis ([facility])  Intractable pain     This chart was dictated using voice recognition software.  Despite best efforts to proofread,  errors can occur which can change the documentation meaning.    Sloan Leiter, DO 05/20/22 [phone]

## 2022-05-21 DIAGNOSIS — D5700 Hb-SS disease with crisis, unspecified: Secondary | ICD-10-CM | POA: Diagnosis not present

## 2022-05-21 LAB — BASIC METABOLIC PANEL
Anion gap: 9 (ref 5–15)
BUN: 27 mg/dL — ABNORMAL HIGH (ref 6–20)
CO2: 20 mmol/L — ABNORMAL LOW (ref 22–32)
Calcium: 8.6 mg/dL — ABNORMAL LOW (ref 8.9–10.3)
Chloride: 109 mmol/L (ref 98–111)
Creatinine, Ser: 1.36 mg/dL — ABNORMAL HIGH (ref 0.44–1.00)
GFR, Estimated: 47 mL/min — ABNORMAL LOW (ref >60)
Glucose, Bld: 93 mg/dL (ref 70–99)
Potassium: 4.9 mmol/L (ref 3.5–5.1)
Sodium: 138 mmol/L (ref 135–145)

## 2022-05-21 MED ORDER — OXYCODONE HCL 5 MG PO TABS
10.0000 mg | ORAL_TABLET | Freq: Four times a day (QID) | ORAL | Status: DC | PRN
  Administered 2022-05-21 – 2022-05-23 (×7): 10 mg via ORAL
  Filled 2022-05-21 (×7): qty 2

## 2022-05-21 MED ORDER — LIP MEDEX EX OINT
TOPICAL_OINTMENT | CUTANEOUS | Status: DC | PRN
  Filled 2022-05-21: qty 7

## 2022-05-21 NOTE — Progress Notes (Signed)
SICKLE CELL SERVICE PROGRESS NOTE  Lindsey [name] IRW:[id number] DOB: 10-Jul-1970 DOA: 05/20/2022 PCP: Dorena Dew, FNP  Assessment/Plan: Principal Problem:   Sickle cell pain crisis [facility]) Active Problems:   Chronic pain   Anemia of chronic disease  Sickle cell pain crisis: Pain is down to 6 out of 10.  Patient is still on Dilaudid PCA.  Also on Toradol.  Will restart her home regimen of oxycodone IR in addition to the OxyContin. Anemia of chronic disease: H&H remained stable.  Continue to monitor Chronic pain syndrome: Continue OxyContin  Code Status: Full code Family Communication: No family at bedside Disposition Plan: Home when ready  [facility]  Pager [phone]. If 7PM-7AM, please contact night-coverage.  05/21/2022, 7:11 AM  LOS: 1 day   Brief narrative: Lindsey [name]  is a 52 y.o. female with a medical history significant for sickle cell disease, chronic pain syndrome, opiate dependence and tolerance, frequent hospitalizations, and history of anemia of chronic disease presents to the emergency department with complaints of bilateral lower extremity pain.  Pain intensity has been elevated over the past 2 days and unrelieved by her home medications.  Patient's home medications include OxyContin and oxycodone.  Patient has not identified any provocative factors concerning crisis. Pain intensity is 8/10, constant and throbbing.  No recent falls or injuries.  No fever, chills, or chest pain.  No shortness of breath.  No urinary send nausea, vomiting, or diarrhea.  No recent travel or sick contacts.  Consultants: None  Procedures: Chest x-ray  Antibiotics: None  HPI/Subjective: Patient is complaining of 6 out of 10 pain now.  She is laying in bed.  She has been using the PCA.  Pain is improved.  Objective: Vitals:   05/20/22 2232 05/20/22 2305 05/21/22 0310 05/21/22 0452  BP: 113/64   (!) 99/56  Pulse: 71   71  Resp: 17 16 16 16   Temp: 98.6 F (37 C)    98.4 F (36.9 C)  TempSrc: Oral   Oral  SpO2: 100% 100% 100% 100%  Weight:      Height:       Weight change:   Intake/Output Summary (Last 24 hours) at 05/21/2022 0711 Last data filed at 05/21/2022 0310 Gross per 24 hour  Intake 507.5 ml  Output --  Net 507.5 ml    General: Alert, awake, oriented x3, in no acute distress.  HEENT: [place]/AT PEERL, EOMI Neck: Trachea midline,  no masses, no thyromegal,y no JVD, no carotid bruit OROPHARYNX:  Moist, No exudate/ erythema/lesions.  Heart: Regular rate and rhythm, without murmurs, rubs, gallops, PMI non-displaced, no heaves or thrills on palpation.  Lungs: Clear to auscultation, no wheezing or rhonchi noted. No increased vocal fremitus resonant to percussion  Abdomen: Soft, nontender, nondistended, positive bowel sounds, no masses no hepatosplenomegaly noted..  Neuro: No focal neurological deficits noted cranial nerves II through XII grossly intact. DTRs 2+ bilaterally upper and lower extremities. Strength 5 out of 5 in bilateral upper and lower extremities. Musculoskeletal: No warm swelling or erythema around joints, no spinal tenderness noted. Psychiatric: Patient alert and oriented x3, good insight and cognition, good recent to remote recall. Lymph node survey: No cervical axillary or inguinal lymphadenopathy noted.   Data Reviewed: Basic Metabolic Panel: Recent Labs  Lab 05/20/22 0241 05/20/22 0453  NA 140 139  K 4.3 4.4  CL 109 109  CO2 22 23  GLUCOSE 99 102*  BUN 15 16  CREATININE 0.99 0.97  CALCIUM 9.1 9.0  MG  --  2.4   Liver Function Tests: Recent Labs  Lab 05/20/22 0241 05/20/22 0453  AST 19 18  ALT 27 26  ALKPHOS 51 50  BILITOT 1.0 1.1  PROT 7.6 7.7  ALBUMIN 4.4 4.5   No results for input(s): "LIPASE", "AMYLASE" in the last 168 hours. No results for input(s): "AMMONIA" in the last 168 hours. CBC: Recent Labs  Lab 05/20/22 0241 05/20/22 0453 05/20/22 1030  WBC 10.0 10.7* 13.0*  NEUTROABS 5.1 4.7  --    HGB 10.7* 10.3* 9.9*  HCT 28.9* 27.6* 27.3*  MCV 79.8* 80.2 81.5  PLT 554* 486* 353   Cardiac Enzymes: No results for input(s): "CKTOTAL", "CKMB", "CKMBINDEX", "TROPONINI" in the last 168 hours. BNP (last 3 results) No results for input(s): "BNP" in the last 8760 hours.  ProBNP (last 3 results) No results for input(s): "PROBNP" in the last 8760 hours.  CBG: No results for input(s): "GLUCAP" in the last 168 hours.  No results found for this or any previous visit (from the past 240 hour(s)).   Studies: DG Chest Port 1 View  Result Date: 05/20/2022 CLINICAL DATA:  Sickle cell crisis EXAM: PORTABLE CHEST 1 VIEW COMPARISON:  04/04/2022 FINDINGS: The heart size and mediastinal contours are within normal limits. Low lung volumes. Both lungs are clear. No displaced rib fractures. IMPRESSION: No active disease. Electronically Signed   By: Placido Sou M.D.   On: 05/20/2022 02:09    Scheduled Meds:  enoxaparin (LOVENOX) injection  40 mg Subcutaneous H37J   folic acid  1 mg Oral Daily   gabapentin  400 mg Oral TID   HYDROmorphone   Intravenous Q4H   ketorolac  15 mg Intravenous Q6H   oxyCODONE  10 mg Oral Q12H   senna-docusate  1 tablet Oral BID   Continuous Infusions:  Principal Problem:   Sickle cell pain crisis (HCC) Active Problems:   Chronic pain   Anemia of chronic disease

## 2022-05-22 LAB — BASIC METABOLIC PANEL
Anion gap: 7 (ref 5–15)
BUN: 20 mg/dL (ref 6–20)
CO2: 23 mmol/L (ref 22–32)
Calcium: 8.6 mg/dL — ABNORMAL LOW (ref 8.9–10.3)
Chloride: 110 mmol/L (ref 98–111)
Creatinine, Ser: 0.95 mg/dL (ref 0.44–1.00)
GFR, Estimated: >60 mL/min (ref >60)
Glucose, Bld: 93 mg/dL (ref 70–99)
Potassium: 4.9 mmol/L (ref 3.5–5.1)
Sodium: 140 mmol/L (ref 135–145)

## 2022-05-22 MED ORDER — MELATONIN 3 MG PO TABS
3.0000 mg | ORAL_TABLET | Freq: Once | ORAL | Status: AC
  Administered 2022-05-22: 3 mg via ORAL
  Filled 2022-05-22: qty 1

## 2022-05-23 DIAGNOSIS — D5700 Hb-SS disease with crisis, unspecified: Secondary | ICD-10-CM | POA: Diagnosis not present

## 2022-05-23 NOTE — Discharge Summary (Signed)
Physician Discharge Summary   Patient: Lindsey [name] MRN: [id number] DOB: 1970-07-14  Admit date:     05/20/2022  Discharge date: {dischdate:26783}  Discharge Physician: Barbette Merino   PCP: Dorena Dew, FNP   Recommendations at discharge:  {Tip this will not be part of the note when signed- Example include specific recommendations for outpatient follow-up, pending tests to follow-up on. (Optional):26781}  ***  Discharge Diagnoses: Principal Problem:   Sickle cell pain crisis ([place]) Active Problems:   Chronic pain   Anemia of chronic disease  Resolved Problems:   * No resolved hospital problems. [facility] Course: No notes on file  Assessment and Plan: No notes have been filed under this hospital service. Service: Hospitalist     {Tip this will not be part of the note when signed Body mass index is 27.34 kg/m. , ,  (Optional):26781}  {(NOTE) Pain control PDMP Statment (Optional):26782} Consultants: *** Procedures performed: ***  Disposition: {Plan; Disposition:26390} Diet recommendation:  Discharge Diet Orders (From admission, onward)     Start     Ordered   05/23/22 0000  Diet - low sodium heart healthy        05/23/22 1646           {Diet_Plan:26776} DISCHARGE MEDICATION: Allergies as of 05/23/2022       Reactions   Ketamine Anxiety, Other (See Comments)   Tachycardia        Medication List     TAKE these medications    ALPRAZolam 0.25 MG tablet Commonly known as: XANAX Take 1 tablet (0.25 mg total) by mouth 2 (two) times daily as needed for anxiety.   Benadryl Allergy 25 MG tablet Generic drug: diphenhydrAMINE Take 50 mg by mouth at bedtime as needed for sleep.   carboxymethylcellulose 0.5 % Soln Commonly known as: REFRESH PLUS Place 1 drop into both eyes daily as needed (dry eyes).   folic acid 1 MG tablet Commonly known as: FOLVITE Take 1 tablet (1 mg total) by mouth daily.   gabapentin 400 MG capsule Commonly known  as: NEURONTIN Take 1 capsule (400 mg total) by mouth 3 (three) times daily.   ibuprofen 200 MG tablet Commonly known as: ADVIL Take 400 mg by mouth every 6 (six) hours as needed for mild pain.   oxyCODONE 10 mg 12 hr tablet Commonly known as: OXYCONTIN Take 1 tablet (10 mg total) by mouth every 12 (twelve) hours.   Oxycodone HCl 10 MG Tabs Take 1 tablet (10 mg total) by mouth every 6 (six) hours as needed for up to 15 days (for moderate to severe pain).        Discharge Exam: Filed Weights   05/20/22 0107  Weight: 63.5 kg   ***  Condition at discharge: {DC Condition:26389}  The results of significant diagnostics from this hospitalization (including imaging, microbiology, ancillary and laboratory) are listed below for reference.   Imaging Studies: DG Chest Port 1 View  Result Date: 05/20/2022 CLINICAL DATA:  Sickle cell crisis EXAM: PORTABLE CHEST 1 VIEW COMPARISON:  04/04/2022 FINDINGS: The heart size and mediastinal contours are within normal limits. Low lung volumes. Both lungs are clear. No displaced rib fractures. IMPRESSION: No active disease. Electronically Signed   By: Placido Sou M.D.   On: 05/20/2022 02:09    Microbiology: Results for orders placed or performed during the hospital encounter of 01/28/22  MRSA Next Gen by PCR, Nasal     Status: Abnormal   Collection Time: 01/29/22 12:13 AM  Specimen: Nasal Mucosa; Nasal Swab  Result Value Ref Range Status   MRSA by PCR Next Gen DETECTED (A) NOT DETECTED Final    Comment: RESULT CALLED TO, READ BACK BY AND VERIFIED WITH: [place], R @ [phone] JMK (NOTE) The GeneXpert MRSA Assay (FDA approved for NASAL specimens only), is one component of a comprehensive MRSA colonization surveillance program. It is not intended to diagnose MRSA infection nor to guide or monitor treatment for MRSA infections. Test performance is not FDA approved in patients less than 8 years old. Performed at [facility],  [place] [address] [place], [place] [address]     Labs: CBC: Recent Labs  Lab 05/20/22 0241 05/20/22 0453 05/20/22 1030  WBC 10.0 10.7* 13.0*  NEUTROABS 5.1 4.7  --   HGB 10.7* 10.3* 9.9*  HCT 28.9* 27.6* 27.3*  MCV 79.8* 80.2 81.5  PLT 554* 486* 778   Basic Metabolic Panel: Recent Labs  Lab 05/20/22 0241 05/20/22 0453 05/21/22 0806 05/22/22 0705  NA 140 139 138 140  K 4.3 4.4 4.9 4.9  CL 109 109 109 110  CO2 22 23 20* 23  GLUCOSE 99 102* 93 93  BUN 15 16 27* 20  CREATININE 0.99 0.97 1.36* 0.95  CALCIUM 9.1 9.0 8.6* 8.6*  MG  --  2.4  --   --    Liver Function Tests: Recent Labs  Lab 05/20/22 0241 05/20/22 0453  AST 19 18  ALT 27 26  ALKPHOS 51 50  BILITOT 1.0 1.1  PROT 7.6 7.7  ALBUMIN 4.4 4.5   CBG: No results for input(s): "GLUCAP" in the last 168 hours.  Discharge time spent: {LESS THAN/GREATER EUMP:53614} 30 minutes.  SignedBarbette Merino, MD [facility] 05/23/2022

## 2022-05-24 ENCOUNTER — Telehealth: Payer: Self-pay | Admitting: Family Medicine

## 2022-05-24 ENCOUNTER — Other Ambulatory Visit: Payer: Self-pay

## 2022-05-24 ENCOUNTER — Telehealth: Payer: Self-pay

## 2022-05-24 DIAGNOSIS — G894 Chronic pain syndrome: Secondary | ICD-10-CM

## 2022-05-24 DIAGNOSIS — D5700 Hb-SS disease with crisis, unspecified: Secondary | ICD-10-CM

## 2022-05-24 MED ORDER — OXYCODONE HCL 10 MG PO TABS: 10.0000 mg | ORAL_TABLET | Freq: Four times a day (QID) | ORAL | 0 refills | Status: DC | PRN

## 2022-05-24 NOTE — Telephone Encounter (Signed)
Oxycodone and oxycontin refill request. Please send to [facility] on [address]

## 2022-05-24 NOTE — Hospital Course (Signed)
Patient admitted with sickle cell pain crisis.  Was having pain rated as 10 out of 10.  Admitted and initiated on Dilaudid PCA, Toradol, IV fluids and some other supportive care.  Patient has done better.  Pain has gone down to 3 out of 10.  At the time of discharge patient is back to baseline.  Patient transition to her home regimen and discharged home to follow-up with PCP.

## 2022-05-24 NOTE — Telephone Encounter (Signed)
Transition Care Management Unsuccessful Follow-up Telephone Call  Date of discharge and from where:  [name] 05/23/2022  Attempts:  1st Attempt  Reason for unsuccessful TCM follow-up call:  Left voice message [name], [place] Direct Dial [phone]

## 2022-05-24 NOTE — Telephone Encounter (Signed)
From: Tereasa Coop To: Office of [name], [place] Sent: 05/23/2022 5:28 PM EST Subject: Medication Renewal Request  Refills have been requested for the following medications:   Oxycodone HCl 10 MG TABS [Lachina Hollis]  Preferred pharmacy: [facility] ([facility]), [place] - [address] Delivery method: [employer]

## 2022-05-25 ENCOUNTER — Other Ambulatory Visit: Payer: Self-pay

## 2022-05-25 ENCOUNTER — Telehealth: Payer: Self-pay

## 2022-05-25 NOTE — Telephone Encounter (Signed)
Approved until 08/23/2022 

## 2022-05-25 NOTE — Telephone Encounter (Signed)
Oxycodone prior auth submitted today via [place] confirmation # [id number]

## 2022-05-25 NOTE — Telephone Encounter (Signed)
Transition Care Management Unsuccessful Follow-up Telephone Call  Date of discharge and from where:  [facility]/2022  Attempts:  2nd Attempt  Reason for unsuccessful TCM follow-up call:  Left voice message [name], [place] Direct Dial [phone]

## 2022-05-26 NOTE — Telephone Encounter (Signed)
Transition Care Management Unsuccessful Follow-up Telephone Call  Date of discharge and from where:  [facility] 05/23/2022  Attempts:  3rd Attempt  Reason for unsuccessful TCM follow-up call:  Left voice message [name], [place] Direct Dial [phone]

## 2022-05-27 ENCOUNTER — Other Ambulatory Visit: Payer: Self-pay

## 2022-05-27 DIAGNOSIS — G894 Chronic pain syndrome: Secondary | ICD-10-CM

## 2022-05-27 MED ORDER — OXYCODONE HCL ER 10 MG PO T12A: 10.0000 mg | EXTENDED_RELEASE_TABLET | Freq: Two times a day (BID) | ORAL | 0 refills | Status: DC

## 2022-05-27 NOTE — Telephone Encounter (Signed)
From: Tereasa Coop To: Office of [name], [place] Sent: 05/27/2022 12:13 PM EST Subject: Medication Renewal Request  Refills have been requested for the following medications:   oxyCODONE (OXYCONTIN) 10 mg 12 hr tablet [Lachina Hollis]  Preferred pharmacy: [facility], [address] AT [place] Delivery method: Arlyss Gandy

## 2022-05-30 ENCOUNTER — Other Ambulatory Visit: Payer: Self-pay | Admitting: Family Medicine

## 2022-05-30 ENCOUNTER — Other Ambulatory Visit: Payer: Self-pay

## 2022-05-30 ENCOUNTER — Telehealth: Payer: Self-pay | Admitting: Family Medicine

## 2022-05-30 DIAGNOSIS — G894 Chronic pain syndrome: Secondary | ICD-10-CM

## 2022-05-30 DIAGNOSIS — D5700 Hb-SS disease with crisis, unspecified: Secondary | ICD-10-CM

## 2022-05-30 MED ORDER — OXYCODONE HCL 10 MG PO TABS: 10.0000 mg | ORAL_TABLET | Freq: Four times a day (QID) | ORAL | 0 refills | Status: DC | PRN

## 2022-05-30 NOTE — Telephone Encounter (Signed)
From: Tereasa Coop To: Office of [name],  Sent: 05/28/2022 5:19 PM EST Subject: Medication Renewal Request  Refills have been requested for the following medications:   Oxycodone HCl 10 MG TABS [Lindsey [name]]  Patient Comment: The remaining 35  Preferred pharmacy: [facility] ([facility]), [place] - [address] Delivery method: [employer]

## 2022-05-30 NOTE — Telephone Encounter (Signed)
Pt states she picked up her oxycodone last week and they only let her get 25 of them. They stated she would need to get another prescription for the remaining 35.

## 2022-05-30 NOTE — Progress Notes (Signed)
Reviewed PDMP substance reporting system prior to prescribing opiate medications. No inconsistencies noted.  Meds ordered this encounter  Medications   Oxycodone HCl 10 MG TABS    Sig: Take 1 tablet (10 mg total) by mouth every 6 (six) hours as needed for up to 15 days (for moderate to severe pain).    Dispense:  60 tablet    Refill:  0    Order Specific Question:   Supervising Provider    Answer:   Tresa Garter [id number]   [name]  APRN, MSN, FNP-C [facility] [address] [place], [place] [address] [phone]

## 2022-06-09 ENCOUNTER — Inpatient Hospital Stay (HOSPITAL_COMMUNITY)
Admission: AD | Admit: 2022-06-09 | Discharge: 2022-06-11 | DRG: 812 | Disposition: A | Discharge status: Home / Self Care | Payer: Medicaid Other | Source: Ambulatory Visit | Attending: Internal Medicine | Admitting: Internal Medicine

## 2022-06-09 ENCOUNTER — Telehealth (HOSPITAL_COMMUNITY): Payer: Self-pay | Admitting: *Deleted

## 2022-06-09 DIAGNOSIS — D638 Anemia in other chronic diseases classified elsewhere: Secondary | ICD-10-CM | POA: Diagnosis present

## 2022-06-09 DIAGNOSIS — G894 Chronic pain syndrome: Secondary | ICD-10-CM | POA: Diagnosis present

## 2022-06-09 DIAGNOSIS — F1120 Opioid dependence, uncomplicated: Secondary | ICD-10-CM | POA: Diagnosis present

## 2022-06-09 DIAGNOSIS — D5700 Hb-SS disease with crisis, unspecified: Secondary | ICD-10-CM | POA: Diagnosis present

## 2022-06-09 DIAGNOSIS — F17210 Nicotine dependence, cigarettes, uncomplicated: Secondary | ICD-10-CM | POA: Diagnosis present

## 2022-06-09 DIAGNOSIS — Z79899 Other long term (current) drug therapy: Secondary | ICD-10-CM | POA: Diagnosis not present

## 2022-06-09 DIAGNOSIS — G8929 Other chronic pain: Secondary | ICD-10-CM | POA: Diagnosis present

## 2022-06-09 LAB — CBC WITH DIFFERENTIAL/PLATELET
Abs Immature Granulocytes: 0.02 10*3/uL (ref 0.00–0.07)
Basophils Absolute: 0.1 10*3/uL (ref 0.0–0.1)
Basophils Relative: 1 %
Eosinophils Absolute: 0.2 10*3/uL (ref 0.0–0.5)
Eosinophils Relative: 3 %
HCT: 29.2 % — ABNORMAL LOW (ref 36.0–46.0)
Hemoglobin: 10.6 g/dL — ABNORMAL LOW (ref 12.0–15.0)
Immature Granulocytes: 0 %
Lymphocytes Relative: 24 %
Lymphs Abs: 2 10*3/uL (ref 0.7–4.0)
MCH: 29.7 pg (ref 26.0–34.0)
MCHC: 36.3 g/dL — ABNORMAL HIGH (ref 30.0–36.0)
MCV: 81.8 fL (ref 80.0–100.0)
Monocytes Absolute: 0.3 10*3/uL (ref 0.1–1.0)
Monocytes Relative: 4 %
Neutro Abs: 5.7 10*3/uL (ref 1.7–7.7)
Neutrophils Relative %: 68 %
Platelets: 402 10*3/uL — ABNORMAL HIGH (ref 150–400)
RBC: 3.57 MIL/uL — ABNORMAL LOW (ref 3.87–5.11)
RDW: 16.7 % — ABNORMAL HIGH (ref 11.5–15.5)
WBC: 8.3 10*3/uL (ref 4.0–10.5)
nRBC: 0.2 % (ref 0.0–0.2)

## 2022-06-09 LAB — COMPREHENSIVE METABOLIC PANEL
ALT: 11 U/L (ref 0–44)
AST: 16 U/L (ref 15–41)
Albumin: 4.1 g/dL (ref 3.5–5.0)
Alkaline Phosphatase: 37 U/L — ABNORMAL LOW (ref 38–126)
Anion gap: 9 (ref 5–15)
BUN: 12 mg/dL (ref 6–20)
CO2: 25 mmol/L (ref 22–32)
Calcium: 8.6 mg/dL — ABNORMAL LOW (ref 8.9–10.3)
Chloride: 107 mmol/L (ref 98–111)
Creatinine, Ser: 0.91 mg/dL (ref 0.44–1.00)
GFR, Estimated: >60 mL/min (ref >60)
Glucose, Bld: 95 mg/dL (ref 70–99)
Potassium: 3.8 mmol/L (ref 3.5–5.1)
Sodium: 141 mmol/L (ref 135–145)
Total Bilirubin: 1 mg/dL (ref 0.3–1.2)
Total Protein: 7.5 g/dL (ref 6.5–8.1)

## 2022-06-09 LAB — RETICULOCYTES
Immature Retic Fract: 29.8 % — ABNORMAL HIGH (ref 2.3–15.9)
RBC.: 3.5 MIL/uL — ABNORMAL LOW (ref 3.87–5.11)
Retic Count, Absolute: 122.5 10*3/uL (ref 19.0–186.0)
Retic Ct Pct: 3.5 % — ABNORMAL HIGH (ref 0.4–3.1)

## 2022-06-09 MED ORDER — NALOXONE HCL 0.4 MG/ML IJ SOLN: 0.4000 mg | INTRAMUSCULAR | Status: DC | PRN

## 2022-06-09 MED ORDER — SENNOSIDES-DOCUSATE SODIUM 8.6-50 MG PO TABS
1.0000 | ORAL_TABLET | Freq: Two times a day (BID) | ORAL | Status: DC
  Administered 2022-06-09 – 2022-06-10 (×2): 1 via ORAL
  Filled 2022-06-09 (×4): qty 1

## 2022-06-09 MED ORDER — GABAPENTIN 400 MG PO CAPS
400.0000 mg | ORAL_CAPSULE | Freq: Three times a day (TID) | ORAL | Status: DC
  Administered 2022-06-09 – 2022-06-11 (×5): 400 mg via ORAL
  Filled 2022-06-09 (×5): qty 1

## 2022-06-09 MED ORDER — DIPHENHYDRAMINE HCL 25 MG PO CAPS
25.0000 mg | ORAL_CAPSULE | ORAL | Status: DC | PRN
  Administered 2022-06-09 – 2022-06-11 (×4): 25 mg via ORAL
  Filled 2022-06-09 (×4): qty 1

## 2022-06-09 MED ORDER — OXYCODONE HCL ER 10 MG PO T12A
10.0000 mg | EXTENDED_RELEASE_TABLET | Freq: Two times a day (BID) | ORAL | Status: DC
  Administered 2022-06-09 – 2022-06-11 (×4): 10 mg via ORAL
  Filled 2022-06-09 (×4): qty 1

## 2022-06-09 MED ORDER — HYDROMORPHONE 1 MG/ML IV SOLN
INTRAVENOUS | Status: DC
  Administered 2022-06-09: 6 mg via INTRAVENOUS
  Administered 2022-06-09: 30 mg via INTRAVENOUS
  Administered 2022-06-09: 4 mg via INTRAVENOUS
  Administered 2022-06-09: 9.5 mg via INTRAVENOUS
  Administered 2022-06-10: 3.5 mg via INTRAVENOUS
  Administered 2022-06-10: 4.5 mg via INTRAVENOUS
  Administered 2022-06-10: 7 mg via INTRAVENOUS
  Administered 2022-06-10: 4 mg via INTRAVENOUS
  Administered 2022-06-10: 3 mg via INTRAVENOUS
  Administered 2022-06-10: 2.5 mg via INTRAVENOUS
  Administered 2022-06-11: 3 mg via INTRAVENOUS
  Administered 2022-06-11: 1 mg via INTRAVENOUS
  Administered 2022-06-11: 5.5 mg via INTRAVENOUS
  Filled 2022-06-09 (×2): qty 30

## 2022-06-09 MED ORDER — KETOROLAC TROMETHAMINE 30 MG/ML IJ SOLN
15.0000 mg | Freq: Once | INTRAMUSCULAR | Status: AC
  Administered 2022-06-09: 15 mg via INTRAVENOUS
  Filled 2022-06-09: qty 1

## 2022-06-09 MED ORDER — SODIUM CHLORIDE 0.45 % IV SOLN: INTRAVENOUS | Status: DC

## 2022-06-09 MED ORDER — POLYETHYLENE GLYCOL 3350 17 G PO PACK: 17.0000 g | PACK | Freq: Every day | ORAL | Status: DC | PRN

## 2022-06-09 MED ORDER — FOLIC ACID 1 MG PO TABS
1.0000 mg | ORAL_TABLET | Freq: Every day | ORAL | Status: DC
  Administered 2022-06-09 – 2022-06-11 (×3): 1 mg via ORAL
  Filled 2022-06-09 (×3): qty 1

## 2022-06-09 MED ORDER — SODIUM CHLORIDE 0.9% FLUSH: 9.0000 mL | INTRAVENOUS | Status: DC | PRN

## 2022-06-09 MED ORDER — ONDANSETRON HCL 4 MG/2ML IJ SOLN: 4.0000 mg | Freq: Four times a day (QID) | INTRAMUSCULAR | Status: DC | PRN

## 2022-06-09 MED ORDER — ACETAMINOPHEN 500 MG PO TABS
1000.0000 mg | ORAL_TABLET | Freq: Once | ORAL | Status: AC
  Administered 2022-06-09: 1000 mg via ORAL
  Filled 2022-06-09: qty 2

## 2022-06-09 MED ORDER — ENOXAPARIN SODIUM 40 MG/0.4ML IJ SOSY
40.0000 mg | PREFILLED_SYRINGE | INTRAMUSCULAR | Status: DC
  Administered 2022-06-09 – 2022-06-10 (×2): 40 mg via SUBCUTANEOUS
  Filled 2022-06-09 (×2): qty 0.4

## 2022-06-09 MED ORDER — KETOROLAC TROMETHAMINE 15 MG/ML IJ SOLN
15.0000 mg | Freq: Four times a day (QID) | INTRAMUSCULAR | Status: DC
  Administered 2022-06-09 – 2022-06-11 (×7): 15 mg via INTRAVENOUS
  Filled 2022-06-09 (×7): qty 1

## 2022-06-09 NOTE — H&P (Signed)
H&P  Patient Demographics:  Lindsey [name], is a 52 y.o. female  MRN: [id number]   DOB - [birth year]  Admit Date - 06/09/2022  Outpatient Primary MD for the patient is Dorena Dew, FNP       HPI:   History of Present Illness: Lindsey [name] is a 52 year old female with a medical history significant for sickle cell disease, chronic pain syndrome, opiate dependence and tolerance, and anemia of chronic disease that presents with complaints of pain primarily to left shoulder and lower extremities.  Pain is consistent with previous sickle cell pain crisis.  Patient states that she has been attempting to manage sickle cell crisis at home for 4 days.  She has been taking both OxyContin and oxycodone without very much relief.  Patient last had both medications this a.m.  Her pain intensity remains 9/10, constant, and throbbing.  Patient denies any fever, chills, chest pain, or shortness of breath.  No urinary symptoms, nausea, vomiting, or diarrhea.  No sick contacts, recent travel, or known exposure to COVID-19.   Sickle cell day infusion center course: Vital signs recorded as: BP 125/72 (BP Location: Right Arm)   Pulse (!) 56   Temp 98.1 F (36.7 C) (Oral)   Resp 18   LMP 07/09/2021 (Approximate)   SpO2 100%  .  All laboratory values, complete metabolic panel notable for hemoglobin of 10.6 otherwise unremarkable.  Awaiting CMP results.  Reticulocytes 3.5%.  Pain persists despite IV Dilaudid PCA, IV fluids, and IV Toradol.  Admit to [facility] for further management of sickle cell pain crisis.  Review of systems:  In addition to the HPI above, patient reports No fever or chills No Headache, No changes with vision or hearing No problems swallowing food or liquids No chest pain, cough or shortness of breath No abdominal pain, No nausea or vomiting, Bowel movements are regular No blood in stool or urine No dysuria No new skin rashes or bruises No new joints pains-aches No new  weakness, tingling, numbness in any extremity No recent weight gain or loss No polyuria, polydypsia or polyphagia No significant Mental Stressors  With Past History of the following :   Past Medical History:  Diagnosis Date   Acute kidney injury ([place]) 01/18/2021   Acute pain of left shoulder    Cough 06/27/2020   Hypokalemia 06/27/2020   Hypoxia    Leukocytosis 09/03/2016   Opiate abuse, episodic ([place]) 09/25/2017   Opioid dependence in remission ([place])    Right leg pain    Sickle cell crisis ([place])    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      Past Surgical History:  Procedure Laterality Date   APPENDECTOMY     CESAREAN SECTION     OTHER SURGICAL HISTORY     c-section     Social History:   Social History   Tobacco Use   Smoking status: Some Days    Packs/day: 0.00    Types: Cigars, Cigarettes   Smokeless tobacco: Never  Substance Use Topics   Alcohol use: Yes    Comment: occasionally     Lives - At home   Family History :   Family History  Problem Relation Age of Onset   Stroke Neg Hx        none that she knows of    Seizures Neg Hx      Home Medications:   Prior to Admission medications   Medication Sig Start Date End Date Taking?  Authorizing Provider  folic acid (FOLVITE) 1 MG tablet Take 1 tablet (1 mg total) by mouth daily. 12/31/21 12/31/22 Yes Elwyn Reach, MD  gabapentin (NEURONTIN) 400 MG capsule Take 1 capsule (400 mg total) by mouth 3 (three) times daily. 04/07/22  Yes Dorena Dew, FNP  oxyCODONE (OXYCONTIN) 10 mg 12 hr tablet Take 1 tablet (10 mg total) by mouth every 12 (twelve) hours. 05/27/22  Yes Dorena Dew, FNP  Oxycodone HCl 10 MG TABS Take 1 tablet (10 mg total) by mouth every 6 (six) hours as needed for up to 15 days (for moderate to severe pain). 05/30/22 06/14/22 Yes Dorena Dew, FNP  ALPRAZolam Duanne Moron) 0.25 MG tablet Take 1 tablet (0.25 mg total) by mouth 2 (two) times daily as needed for anxiety. Patient not  taking: Reported on 03/25/2022 12/31/21   Elwyn Reach, MD  carboxymethylcellulose (REFRESH PLUS) 0.5 % SOLN Place 1 drop into both eyes daily as needed (dry eyes).    [provider]  diphenhydrAMINE (BENADRYL ALLERGY) 25 MG tablet Take 50 mg by mouth at bedtime as needed for sleep.    [provider]  ibuprofen (ADVIL) 200 MG tablet Take 400 mg by mouth every 6 (six) hours as needed for mild pain.    [provider]     Allergies:   Allergies  Allergen Reactions   Ketamine Anxiety and Other (See Comments)    Tachycardia     Physical Exam:   Vitals:   Vitals:   06/09/22 1439 06/09/22 1551  BP: (!) 98/55 125/72  Pulse: 67 (!) 56  Resp: 14 18  Temp: 97.8 F (36.6 C) 98.1 F (36.7 C)  SpO2: 98% 100%    Physical Exam: Constitutional: Patient appears well-developed and well-nourished. Not in obvious distress. HENT: Normocephalic, atraumatic, External right and left ear normal. Oropharynx is clear and moist.  Eyes: Conjunctivae and EOM are normal. PERRLA, no scleral icterus. Neck: Normal ROM. Neck supple. No JVD. No tracheal deviation. No thyromegaly. CVS: RRR, S1/S2 +, no murmurs, no gallops, no carotid bruit.  Pulmonary: Effort and breath sounds normal, no stridor, rhonchi, wheezes, rales.  Abdominal: Soft. BS +, no distension, tenderness, rebound or guarding.  Musculoskeletal: Normal range of motion. No edema and no tenderness.  Lymphadenopathy: No lymphadenopathy noted, cervical, inguinal or axillary Neuro: Alert. Normal reflexes, muscle tone coordination. No cranial nerve deficit. Skin: Skin is warm and dry. No rash noted. Not diaphoretic. No erythema. No pallor. Psychiatric: Normal mood and affect. Behavior, judgment, thought content normal.   Data Review:   CBC Recent Labs  Lab 06/09/22 1015  WBC 8.3  HGB 10.6*  HCT 29.2*  PLT 402*  MCV 81.8  MCH 29.7  MCHC 36.3*  RDW 16.7*  LYMPHSABS 2.0  MONOABS 0.3  EOSABS 0.2  BASOSABS  0.1   ------------------------------------------------------------------------------------------------------------------  Chemistries  No results for input(s): "NA", "K", "CL", "CO2", "GLUCOSE", "BUN", "CREATININE", "CALCIUM", "MG", "AST", "ALT", "ALKPHOS", "BILITOT" in the last 168 hours.  Invalid input(s): "GFRCGP" ------------------------------------------------------------------------------------------------------------------ CrCl cannot be calculated (Unknown ideal weight.). ------------------------------------------------------------------------------------------------------------------ No results for input(s): "TSH", "T4TOTAL", "T3FREE", "THYROIDAB" in the last 72 hours.  Invalid input(s): "FREET3"  Coagulation profile No results for input(s): "INR", "PROTIME" in the last 168 hours. ------------------------------------------------------------------------------------------------------------------- No results for input(s): "DDIMER" in the last 72 hours. -------------------------------------------------------------------------------------------------------------------  Cardiac Enzymes No results for input(s): "CKMB", "TROPONINI", "MYOGLOBIN" in the last 168 hours.  Invalid input(s): "CK" ------------------------------------------------------------------------------------------------------------------ No results found for: "BNP"  ---------------------------------------------------------------------------------------------------------------  Urinalysis  Component Value Date/Time   COLORURINE YELLOW 05/31/2021 1350   APPEARANCEUR TURBID (A) 05/31/2021 1350   LABSPEC 1.012 05/31/2021 1350   PHURINE 5.0 05/31/2021 1350   GLUCOSEU NEGATIVE 05/31/2021 1350   HGBUR SMALL (A) 05/31/2021 1350   BILIRUBINUR NEGATIVE 05/31/2021 1350   BILIRUBINUR neg 04/07/2020 1014   KETONESUR NEGATIVE 05/31/2021 1350   PROTEINUR 100 (A) 05/31/2021 1350   UROBILINOGEN 0.2 04/07/2020 1014    UROBILINOGEN 1.0 12/25/2012 1755   NITRITE NEGATIVE 05/31/2021 1350   LEUKOCYTESUR MODERATE (A) 05/31/2021 1350    ----------------------------------------------------------------------------------------------------------------   Imaging Results:    No results found.   Assessment & Plan:  Active Problems:   Sickle cell pain crisis ([place])   Sickle cell disease with pain crisis: Admit to [facility].  Continue IV Dilaudid PCA without any changes. OxyContin 10 mg every 12 hours Hold oxycodone, will restart as pain intensity improves Toradol 15 mg IV every 6 hours Monitor vital signs very closely, reevaluate pain scale regularly, and supplemental oxygen as needed.  Chronic pain syndrome: Continue home medications  Anemia of chronic disease: Hemoglobin is stable and consistent with patient's baseline.  There is no clinical negation for blood transfusion at this time.  Monitor closely.   DVT Prophylaxis: Subcut Lovenox   AM Labs Ordered, also please review Full Orders  Family Communication: Admission, patient's condition and plan of care including tests being ordered have been discussed with the patient who indicate understanding and agree with the plan and Code Status.  Code Status: Full Code  Consults called: None    Admission status: Inpatient    Time spent in minutes : 30 minutes  [place], MSN, FNP-C [facility] [address] [place], [place] [address] [phone]  06/09/2022 at 4:40 PM

## 2022-06-09 NOTE — Progress Notes (Signed)
Patient admitted to the day hospital for sickle cell pain crisis. Patient initially reported left arm and shoulder pain rated 6/10. For pain management, patient placed on Dilaudid PCA, given 15 mg IV Toradol, 1000 mg Tylenol and hydrated with IV fluids. Patient's pain remained elevated at 5/10. Provider assessed patient and placed orders for admission to inpatient unit for continued pain management. Report called to Ebony Hail, RN on [address]. Patient transferred to [place] in wheelchair on PCA (settings 0.5/10/3 verified with Marnette Burgess, Therapist, sports). Vital signs wnl. Patient alert, oriented and stable at transfer.

## 2022-06-09 NOTE — H&P (Signed)
[facility] History and Physical   Date: 06/09/2022  Patient name: Lindsey [name] Medical record number: [id number] Date of birth: 1971-02-13 Age: 52 y.o. Gender: female PCP: Dorena Dew, FNP  Attending physician: Tresa Garter, MD  Chief Complaint: Sickle cell pain   History of Present Illness: Lindsey [name] is a 52 year old female with a medical history significant for sickle cell disease, chronic pain syndrome, opiate dependence and tolerance, and anemia of chronic disease that presents with complaints of pain primarily to left shoulder and lower extremities.  Pain is consistent with previous sickle cell pain crisis.  Patient states that she has been attempting to manage sickle cell crisis at home for 4 days.  She has been taking both OxyContin and oxycodone without very much relief.  Patient last had both medications this a.m.  Her pain intensity remains 9/10, constant, and throbbing.  Patient denies any fever, chills, chest pain, or shortness of breath.  No urinary symptoms, nausea, vomiting, or diarrhea.  No sick contacts, recent travel, or known exposure to COVID-19.  Meds: Medications Prior to Admission  Medication Sig Dispense Refill Last Dose   ALPRAZolam (XANAX) 0.25 MG tablet Take 1 tablet (0.25 mg total) by mouth 2 (two) times daily as needed for anxiety. (Patient not taking: Reported on 03/25/2022) 20 tablet 0    carboxymethylcellulose (REFRESH PLUS) 0.5 % SOLN Place 1 drop into both eyes daily as needed (dry eyes).      diphenhydrAMINE (BENADRYL ALLERGY) 25 MG tablet Take 50 mg by mouth at bedtime as needed for sleep.      folic acid (FOLVITE) 1 MG tablet Take 1 tablet (1 mg total) by mouth daily. 30 tablet 11    gabapentin (NEURONTIN) 400 MG capsule Take 1 capsule (400 mg total) by mouth 3 (three) times daily. 90 capsule 5    ibuprofen (ADVIL) 200 MG tablet Take 400 mg by mouth every 6 (six) hours as needed for mild pain.      oxyCODONE  (OXYCONTIN) 10 mg 12 hr tablet Take 1 tablet (10 mg total) by mouth every 12 (twelve) hours. 60 tablet 0    Oxycodone HCl 10 MG TABS Take 1 tablet (10 mg total) by mouth every 6 (six) hours as needed for up to 15 days (for moderate to severe pain). 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   Past Surgical History:  Procedure Laterality Date   APPENDECTOMY     CESAREAN SECTION     OTHER SURGICAL HISTORY     c-section   Family History  Problem Relation Age of Onset   Stroke Neg Hx        none that she knows of    Seizures Neg Hx    Social History   Socioeconomic History   Marital status: Married    Spouse name: Not on file   Number of children: 2   Years of education: Not on file   Highest education level: Not on file  Occupational History   Not on file  Tobacco Use  Smoking status: Some Days    Packs/day: 0.00    Types: Cigars, Cigarettes   Smokeless tobacco: Never  Vaping Use   Vaping Use: Former  Substance and Sexual Activity   Alcohol use: Yes    Comment: occasionally   Drug use: No   Sexual activity: Yes    Birth control/protection: None  Other Topics Concern   Not on file  Social History Narrative   Lives at home with husband and son   Right handed   Social Determinants of Health   Financial Resource Strain: Not on file  Food Insecurity: No Food Insecurity (05/20/2022)   Hunger Vital Sign    Worried About Running Out of Food in the Last Year: Never true    Ran Out of Food in the Last Year: Never true  Transportation Needs: No Transportation Needs (05/20/2022)   PRAPARE - Civil engineer, contracting (Medical): No    Lack of Transportation (Non-Medical): No  Physical Activity: Not on file  Stress: Not on file  Social Connections: Not on file  Intimate Partner Violence: Not At Risk (05/20/2022)   Humiliation, Afraid, Rape, and Kick questionnaire    Fear of Current or Ex-Partner: No    Emotionally Abused: No    Physically Abused: No    Sexually Abused: No   Review of Systems  Constitutional: Negative.   HENT: Negative.    Respiratory: Negative.    Cardiovascular: Negative.   Gastrointestinal: Negative.   Genitourinary: Negative.   Musculoskeletal:  Positive for back pain and joint pain.  Skin: Negative.   Neurological: Negative.   Psychiatric/Behavioral: Negative.       Physical Exam: Last menstrual period 07/09/2021. Physical Exam Constitutional:      Appearance: Normal appearance.  Eyes:     Pupils: Pupils are equal, round, and reactive to light.  Cardiovascular:     Rate and Rhythm: Normal rate and regular rhythm.  Pulmonary:     Effort: Pulmonary effort is normal.  Abdominal:     General: Bowel sounds are normal.  Skin:    General: Skin is warm.  Neurological:     General: No focal deficit present.     Mental Status: She is alert. Mental status is at baseline.  Psychiatric:        Mood and Affect: Mood normal.        Behavior: Behavior normal.        Thought Content: Thought content normal.        Judgment: Judgment normal.      Lab results: No results found for this or any previous visit (from the past 24 hour(s)).  Imaging results:  No results found.   Assessment & Plan: Patient admitted to sickle cell day infusion center for management of pain crisis.  Patient is opiate tolerant Initiate IV dilaudid PCA.  IV fluids, 0.45% saline at 100 ml/hr Toradol 15 mg IV times one dose Tylenol 1000 mg by mouth times one dose Review CBC with differential, complete metabolic panel, and reticulocytes as results become available. Pain  intensity will be reevaluated in context of functioning and relationship to baseline as care progresses If pain intensity remains elevated and/or sudden change in hemodynamic stability transition to inpatient services for higher level of care.     [name]  APRN, MSN, FNP-C [facility] [address] [place], [place] [address] [phone]  06/09/2022, 9:24 AM

## 2022-06-09 NOTE — Telephone Encounter (Signed)
Patient called requesting to come to the day hospital for sickle cell pain. Patient reports left should and arm pain rated 5/10. Reports taking Oxycodone and Oxycontin around 5 hours ago.[name] COVID-19 screening done and patient denies all symptoms and exposures. Admits to some nausea but denies vomiting, fever, chest pain, diarrhea and abdominal pain. Admits to having transportation without driving self. Per patient, her husband will be her transportation. [place], [place] notified. Patient advised and expresses an understanding.[name]

## 2022-06-10 ENCOUNTER — Encounter (HOSPITAL_COMMUNITY): Payer: Self-pay | Admitting: Family Medicine

## 2022-06-10 ENCOUNTER — Other Ambulatory Visit: Payer: Self-pay

## 2022-06-10 DIAGNOSIS — D5700 Hb-SS disease with crisis, unspecified: Secondary | ICD-10-CM | POA: Diagnosis not present

## 2022-06-10 LAB — CBC
HCT: 23.9 % — ABNORMAL LOW (ref 36.0–46.0)
Hemoglobin: 8.7 g/dL — ABNORMAL LOW (ref 12.0–15.0)
MCH: 30.1 pg (ref 26.0–34.0)
MCHC: 36.4 g/dL — ABNORMAL HIGH (ref 30.0–36.0)
MCV: 82.7 fL (ref 80.0–100.0)
Platelets: 355 10*3/uL (ref 150–400)
RBC: 2.89 MIL/uL — ABNORMAL LOW (ref 3.87–5.11)
RDW: 16.5 % — ABNORMAL HIGH (ref 11.5–15.5)
WBC: 8.8 10*3/uL (ref 4.0–10.5)
nRBC: 0.5 % — ABNORMAL HIGH (ref 0.0–0.2)

## 2022-06-10 LAB — BASIC METABOLIC PANEL
Anion gap: 8 (ref 5–15)
BUN: 16 mg/dL (ref 6–20)
CO2: 23 mmol/L (ref 22–32)
Calcium: 8.4 mg/dL — ABNORMAL LOW (ref 8.9–10.3)
Chloride: 107 mmol/L (ref 98–111)
Creatinine, Ser: 0.98 mg/dL (ref 0.44–1.00)
GFR, Estimated: >60 mL/min (ref >60)
Glucose, Bld: 93 mg/dL (ref 70–99)
Potassium: 4 mmol/L (ref 3.5–5.1)
Sodium: 138 mmol/L (ref 135–145)

## 2022-06-10 LAB — HIV ANTIBODY (ROUTINE TESTING W REFLEX): HIV Screen 4th Generation wRfx: NONREACTIVE

## 2022-06-10 MED ORDER — OXYCODONE HCL 5 MG PO TABS
10.0000 mg | ORAL_TABLET | Freq: Four times a day (QID) | ORAL | Status: DC | PRN
  Administered 2022-06-10 – 2022-06-11 (×3): 10 mg via ORAL
  Filled 2022-06-10 (×3): qty 2

## 2022-06-10 MED ORDER — SALINE SPRAY 0.65 % NA SOLN
1.0000 | NASAL | Status: DC | PRN
  Administered 2022-06-10: 1 via NASAL
  Filled 2022-06-10: qty 44

## 2022-06-10 NOTE — Progress Notes (Signed)
Subjective: Lindsey [name] is a 52 year old female with a medical history significant for sickle cell disease, chronic pain syndrome, opiate dependence and tolerance, and anemia of chronic disease was admitted for sickle cell pain crisis.  Patient continues to complain of pain primarily to low back and lower extremities.  Pain persists despite IV Dilaudid PCA.  Patient states that she is unable to manage at home at this time.  She denies any headache, dizziness, shortness of breath, urinary symptoms, nausea, vomiting, or diarrhea.  Objective:  Vital signs in last 24 hours:  Vitals:   06/10/22 0354 06/10/22 0755 06/10/22 0909 06/10/22 1035  BP: 103/75   115/78  Pulse: (!) 50   (!) 58  Resp:  14 16 17   Temp: 98.5 F (36.9 C)   98.5 F (36.9 C)  TempSrc: Oral   Oral  SpO2: 98% 97% 97% 96%    Intake/Output from previous day:   Intake/Output Summary (Last 24 hours) at 06/10/2022 1232 Last data filed at 06/10/2022 0913 Gross per 24 hour  Intake 1933.01 ml  Output --  Net 1933.01 ml    Physical Exam: General: Alert, awake, oriented x3, in no acute distress.  HEENT: [place]/AT PEERL, EOMI Neck: Trachea midline,  no masses, no thyromegal,y no JVD, no carotid bruit OROPHARYNX:  Moist, No exudate/ erythema/lesions.  Heart: Regular rate and rhythm, without murmurs, rubs, gallops, PMI non-displaced, no heaves or thrills on palpation.  Lungs: Clear to auscultation, no wheezing or rhonchi noted. No increased vocal fremitus resonant to percussion  Abdomen: Soft, nontender, nondistended, positive bowel sounds, no masses no hepatosplenomegaly noted..  Neuro: No focal neurological deficits noted cranial nerves II through XII grossly intact. DTRs 2+ bilaterally upper and lower extremities. Strength 5 out of 5 in bilateral upper and lower extremities. Musculoskeletal: No warm swelling or erythema around joints, no spinal tenderness noted. Psychiatric: Patient alert and oriented x3, good insight and  cognition, good recent to remote recall. Lymph node survey: No cervical axillary or inguinal lymphadenopathy noted.  Lab Results:  Basic Metabolic Panel:    Component Value Date/Time   NA 138 06/10/2022 0541   NA 142 05/20/2021 1101   K 4.0 06/10/2022 0541   CL 107 06/10/2022 0541   CO2 23 06/10/2022 0541   BUN 16 06/10/2022 0541   BUN 10 05/20/2021 1101   CREATININE 0.98 06/10/2022 0541   GLUCOSE 93 06/10/2022 0541   CALCIUM 8.4 (L) 06/10/2022 0541   CBC:    Component Value Date/Time   WBC 8.8 06/10/2022 0541   HGB 8.7 (L) 06/10/2022 0541   HGB 11.0 (L) 05/20/2021 1101   HCT 23.9 (L) 06/10/2022 0541   HCT 31.3 (L) 05/20/2021 1101   PLT 355 06/10/2022 0541   PLT 509 (H) 05/20/2021 1101   MCV 82.7 06/10/2022 0541   MCV 88 05/20/2021 1101   NEUTROABS 5.7 06/09/2022 1015   NEUTROABS 5.4 05/20/2021 1101   LYMPHSABS 2.0 06/09/2022 1015   LYMPHSABS 2.5 05/20/2021 1101   MONOABS 0.3 06/09/2022 1015   EOSABS 0.2 06/09/2022 1015   EOSABS 0.3 05/20/2021 1101   BASOSABS 0.1 06/09/2022 1015   BASOSABS 0.1 05/20/2021 1101    No results found for this or any previous visit (from the past 240 hour(s)).  Studies/Results: No results found.  Medications: Scheduled Meds:  enoxaparin (LOVENOX) injection  40 mg Subcutaneous C78L   folic acid  1 mg Oral Daily   gabapentin  400 mg Oral TID   HYDROmorphone   Intravenous Q4H  ketorolac  15 mg Intravenous Q6H   oxyCODONE  10 mg Oral Q12H   senna-docusate  1 tablet Oral BID   Continuous Infusions:  sodium chloride 100 mL/hr at 06/10/22 0518   PRN Meds:.diphenhydrAMINE, naloxone **AND** sodium chloride flush, ondansetron (ZOFRAN) IV, polyethylene glycol, sodium chloride  Consultants: none  Procedures: none  Antibiotics: none  Assessment/Plan: Principal Problem:   Sickle cell pain crisis ([place]) Active Problems:   Chronic pain   Anemia of chronic disease  Sickle cell disease with pain crisis: Continue IV Dilaudid PCA  without any changes Decrease IV fluids to KVO Toradol 15 mg IV every 6 hours for total of 5 days OxyContin 10 mg every 12 hours Hold oxycodone, will restart as pain intensity improves Monitor vital signs very closely, reevaluate pain scale regularly, and supplemental oxygen as needed.  Chronic pain syndrome: Continue home medications  Anemia of chronic disease: Hemoglobin is stable and consistent with her baseline.  There is no clinical indication for blood transfusion at this time.  Continue to monitor closely.    Code Status: Full Code Family Communication: N/A Disposition Plan: Not yet ready for discharge  Malin, MSN, FNP-C [facility] [address] [place], [place] [address] [phone]  If 7PM-7AM, please contact night-coverage.  06/10/2022, 12:32 PM  LOS: 1 day

## 2022-06-10 NOTE — TOC Progression Note (Signed)
Transition of Care [facility]) - Progression Note    Patient Details  Name: Lindsey [name] MRN: [id number] Date of Birth: Sep 08, 1970  Transition of Care [facility]) CM/SW [place], RN Phone [phone]  06/10/2022, 3:52 PM  Clinical Narrative:     Transition of Care [facility]) Screening Note   Patient Details  Name: Lindsey [name] Date of Birth: 1970/12/30   Transition of Care [facility]) CM/SW Contact:    Angelita Ingles, RN Phone Number: 06/10/2022, 3:52 PM    Transition of Care Department [facility]) has reviewed patient and no TOC needs have been identified at this time. We will continue to monitor patient advancement through interdisciplinary progression rounds. If new patient transition needs arise, please place a TOC consult.          Expected Discharge Plan and Services                                               Social Determinants of Health (SDOH) Interventions SDOH Screenings   Food Insecurity: No Food Insecurity (05/20/2022)  Housing: Low Risk  (05/20/2022)  Transportation Needs: No Transportation Needs (05/20/2022)  Utilities: Not At Risk (05/20/2022)  Recent Concern: Utilities - At Risk (04/06/2022)  Depression (PHQ2-9): High Risk (12/14/2021)  Tobacco Use: High Risk (05/20/2022)    Readmission Risk Interventions    02/14/2022   10:02 AM 11/30/2021   10:59 AM 10/29/2021    1:04 PM  Readmission Risk Prevention Plan  Transportation Screening Complete Complete Complete  PCP or Specialist Appt within 3-5 Days   Complete  HRI or [place]   Complete  Social Work Consult for [place] Planning/Counseling   Complete  Palliative Care Screening   Not Applicable  Medication Review [employer]) Complete Complete Complete  PCP or Specialist appointment within 3-5 days of discharge Complete Complete   HRI or [place] Complete Complete   SW Recovery Care/Counseling Consult Complete Complete   Palliative Care Screening  Not Applicable Not [place] Not Applicable Not Applicable

## 2022-06-10 NOTE — Plan of Care (Signed)
  Problem: Pain Managment: Goal: General experience of comfort will improve Outcome: Not Progressing   Problem: Safety: Goal: Ability to remain free from injury will improve Outcome: Not Progressing

## 2022-06-11 LAB — CBC
HCT: 27.8 % — ABNORMAL LOW (ref 36.0–46.0)
Hemoglobin: 9.9 g/dL — ABNORMAL LOW (ref 12.0–15.0)
MCH: 29.9 pg (ref 26.0–34.0)
MCHC: 35.6 g/dL (ref 30.0–36.0)
MCV: 84 fL (ref 80.0–100.0)
Platelets: 409 10*3/uL — ABNORMAL HIGH (ref 150–400)
RBC: 3.31 MIL/uL — ABNORMAL LOW (ref 3.87–5.11)
RDW: 16.5 % — ABNORMAL HIGH (ref 11.5–15.5)
WBC: 9.4 10*3/uL (ref 4.0–10.5)
nRBC: 0.2 % (ref 0.0–0.2)

## 2022-06-11 LAB — MRSA NEXT GEN BY PCR, NASAL: MRSA by PCR Next Gen: DETECTED — AB

## 2022-06-11 MED ORDER — MUPIROCIN 2 % EX OINT
1.0000 | TOPICAL_OINTMENT | Freq: Two times a day (BID) | CUTANEOUS | Status: DC
  Administered 2022-06-11: 1 via NASAL
  Filled 2022-06-11: qty 22

## 2022-06-11 MED ORDER — CHLORHEXIDINE GLUCONATE CLOTH 2 % EX PADS: 6.0000 | MEDICATED_PAD | Freq: Every day | CUTANEOUS | Status: DC

## 2022-06-11 NOTE — Discharge Summary (Signed)
Physician Discharge Summary  Lindsey [name] QVZ:[id number] DOB: 12-08-1970 DOA: 06/09/2022  PCP: Dorena Dew, FNP  Admit date: 06/09/2022  Discharge date: 06/11/2022  Discharge Diagnoses:  Principal Problem:   Sickle cell pain crisis ([place]) Active Problems:   Chronic pain   Anemia of chronic disease   Discharge Condition: Stable  Disposition:   Follow-up Information     Dorena Dew, FNP. Schedule an appointment as soon as possible for a visit in 1 week(s).   Specialty: Family Medicine Contact information: [place]. [place] [address] [phone]                Pt is discharged home in good condition and is to follow up with Dorena Dew, FNP this week to have labs evaluated. Lindsey [name] is instructed to increase activity slowly and balance with rest for the next few days, and use prescribed medication to complete treatment of pain  Diet: Regular Wt Readings from Last 3 Encounters:  06/10/22 66.9 kg  05/20/22 63.5 kg  04/04/22 61.2 kg    History of present illness:  History of Present Illness: Lindsey [name] is a 52 year old female with a medical history significant for sickle cell disease, chronic pain syndrome, opiate dependence and tolerance, and anemia of chronic disease that presents with complaints of pain primarily to left shoulder and lower extremities. Pain is consistent with previous sickle cell pain crisis. Patient states that she has been attempting to manage sickle cell crisis at home for 4 days. She has been taking both OxyContin and oxycodone without very much relief. Patient last had both medications this a.m. Her pain intensity remains 9/10, constant, and throbbing. Patient denies any fever, chills, chest pain, or shortness of breath. No urinary symptoms, nausea, vomiting, or diarrhea.  No sick contacts, recent travel, or known exposure to COVID-19.    Sickle cell day infusion center course: Vital signs  recorded as: BP 125/72 (BP Location: Right Arm)   Pulse (!) 56   Temp 98.1 F (36.7 C) (Oral)   Resp 18   LMP 07/09/2021 (Approximate)   SpO2 100%. All laboratory values, complete metabolic panel notable for hemoglobin of 10.6 otherwise unremarkable.  Awaiting CMP results.  Reticulocytes 3.5%.  Pain persists despite IV Dilaudid PCA, IV fluids, and IV Toradol.  Admit to [facility] for further management of sickle cell pain crisis.  Hospital Course:  Patient was admitted for sickle cell pain crisis and managed appropriately with IVF, IV Dilaudid via PCA and IV Toradol, as well as other adjunct therapies per sickle cell pain management protocols.  Patient did well on this regimen.  Pain quickly returns to baseline.  Hemoglobin noted to be at baseline, she did not require blood transfusion during this admission.  As at today, patient said her pain is back to baseline, she is tolerating p.o. intake with no significant restrictions and ambulating well with no significant pain.  She requested to be discharged because she thinks her pain is at a level that she can manage at home. Patient was therefore discharged home today in a hemodynamically stable condition.   Lindsey [name] will follow-up with PCP within 1 week of this discharge. Lindsey [name] was counseled extensively about nonpharmacologic means of pain management, patient verbalized understanding and was appreciative of  the care received during this admission.   We discussed the need for good hydration, monitoring of hydration status, avoidance of heat, cold, stress, and infection triggers. We discussed the  need to be adherent with taking Hydrea and other home medications. Patient was reminded of the need to seek medical attention immediately if any symptom of bleeding, anemia, or infection occurs.  Discharge Exam: Vitals:   06/11/22 0503 06/11/22 0949  BP: 99/67 (!) 100/56  Pulse: (!) 53 62  Resp: 18 14  Temp: 97.6 F (36.4 C) 98.5 F (36.9  C)  SpO2: 95% 96%   Vitals:   06/11/22 0115 06/11/22 0333 06/11/22 0503 06/11/22 0949  BP: 99/64  99/67 (!) 100/56  Pulse: 62  (!) 53 62  Resp: 18 14 18 14   Temp: 97.8 F (36.6 C)  97.6 F (36.4 C) 98.5 F (36.9 C)  TempSrc: Oral  Oral Oral  SpO2: 98% 96% 95% 96%  Weight:      Height:       General appearance : Awake, alert, not in any distress. Speech Clear. Not toxic looking HEENT: Atraumatic and Normocephalic, pupils equally reactive to light and accomodation Neck: Supple, no JVD. No cervical lymphadenopathy.  Chest: Good air entry bilaterally, no added sounds  CVS: S1 S2 regular, no murmurs.  Abdomen: Bowel sounds present, Non tender and not distended with no gaurding, rigidity or rebound. Extremities: B/L Lower Ext shows no edema, both legs are warm to touch Neurology: Awake alert, and oriented X 3, CN II-XII intact, Non focal Skin: No Rash  Discharge Instructions  Discharge Instructions     Diet - low sodium heart healthy   Complete by: As directed    Increase activity slowly   Complete by: As directed       Allergies as of 06/11/2022       Reactions   Ketamine Anxiety, Other (See Comments)   Tachycardia        Medication List     TAKE these medications    ALPRAZolam 0.25 MG tablet Commonly known as: XANAX Take 1 tablet (0.25 mg total) by mouth 2 (two) times daily as needed for anxiety.   carboxymethylcellulose 0.5 % Soln Commonly known as: REFRESH PLUS Place 1 drop into both eyes in the morning, at noon, and at bedtime.   folic acid 1 MG tablet Commonly known as: FOLVITE Take 1 tablet (1 mg total) by mouth daily.   gabapentin 400 MG capsule Commonly known as: NEURONTIN Take 1 capsule (400 mg total) by mouth 3 (three) times daily.   oxyCODONE 10 mg 12 hr tablet Commonly known as: OXYCONTIN Take 1 tablet (10 mg total) by mouth every 12 (twelve) hours. What changed: Another medication with the same name was changed. Make sure you understand  how and when to take each.   Oxycodone HCl 10 MG Tabs Take 1 tablet (10 mg total) by mouth every 6 (six) hours as needed for up to 15 days (for moderate to severe pain). What changed: when to take this   Tylenol 8 Hour Arthritis Pain 650 MG CR tablet Generic drug: acetaminophen Take 650 mg by mouth every 8 (eight) hours as needed for pain.        The results of significant diagnostics from this hospitalization (including imaging, microbiology, ancillary and laboratory) are listed below for reference.    Significant Diagnostic Studies: DG Chest Port 1 View  Result Date: 05/20/2022 CLINICAL DATA:  Sickle cell crisis EXAM: PORTABLE CHEST 1 VIEW COMPARISON:  04/04/2022 FINDINGS: The heart size and mediastinal contours are within normal limits. Low lung volumes. Both lungs are clear. No displaced rib fractures. IMPRESSION: No active disease. Electronically Signed  By: Placido Sou M.D.   On: 05/20/2022 02:09    Microbiology: Recent Results (from the past 240 hour(s))  MRSA Next Gen by PCR, Nasal     Status: Abnormal   Collection Time: 06/11/22  1:06 AM   Specimen: Nasal Mucosa; Nasal Swab  Result Value Ref Range Status   MRSA by PCR Next Gen DETECTED (A) NOT DETECTED Final    Comment: RESULT CALLED TO, READ BACK BY AND VERIFIED WITH: Luanna Salk RN AT [phone] ON 06/11/2022 BY MECIAL J. (NOTE) The GeneXpert MRSA Assay (FDA approved for NASAL specimens only), is one component of a comprehensive MRSA colonization surveillance program. It is not intended to diagnose MRSA infection nor to guide or monitor treatment for MRSA infections. Test performance is not FDA approved in patients less than 86 years old. Performed at [facility], [place] [address], [place], [place] [address]      Labs: Basic Metabolic Panel: Recent Labs  Lab 06/09/22 1613 06/10/22 0541  NA 141 138  K 3.8 4.0  CL 107 107  CO2 25 23  GLUCOSE 95 93  BUN 12 16  CREATININE 0.91 0.98   CALCIUM 8.6* 8.4*   Liver Function Tests: Recent Labs  Lab 06/09/22 1613  AST 16  ALT 11  ALKPHOS 37*  BILITOT 1.0  PROT 7.5  ALBUMIN 4.1   No results for input(s): "LIPASE", "AMYLASE" in the last 168 hours. No results for input(s): "AMMONIA" in the last 168 hours. CBC: Recent Labs  Lab 06/09/22 1015 06/10/22 0541 06/11/22 0556  WBC 8.3 8.8 9.4  NEUTROABS 5.7  --   --   HGB 10.6* 8.7* 9.9*  HCT 29.2* 23.9* 27.8*  MCV 81.8 82.7 84.0  PLT 402* 355 409*   Cardiac Enzymes: No results for input(s): "CKTOTAL", "CKMB", "CKMBINDEX", "TROPONINI" in the last 168 hours. BNP: Invalid input(s): "POCBNP" CBG: No results for input(s): "GLUCAP" in the last 168 hours.  Time coordinating discharge: 50 minutes  Signed:  [facility] 06/11/2022, 10:49 AM

## 2022-06-13 ENCOUNTER — Other Ambulatory Visit: Payer: Self-pay

## 2022-06-13 ENCOUNTER — Other Ambulatory Visit: Payer: Self-pay | Admitting: Nurse Practitioner

## 2022-06-13 ENCOUNTER — Telehealth: Payer: Self-pay

## 2022-06-13 DIAGNOSIS — D5700 Hb-SS disease with crisis, unspecified: Secondary | ICD-10-CM

## 2022-06-13 DIAGNOSIS — G894 Chronic pain syndrome: Secondary | ICD-10-CM

## 2022-06-13 MED ORDER — OXYCODONE HCL 10 MG PO TABS: 10.0000 mg | ORAL_TABLET | Freq: Four times a day (QID) | ORAL | 0 refills | Status: DC | PRN

## 2022-06-13 NOTE — Telephone Encounter (Signed)
Transition Care Management Follow-up Telephone Call Date of discharge and from where: [place] 06-11-22 Dx: Sickle cell pain crisis  How have you been since you were released from the hospital? In a little pain but feeling a little better  Any questions or concerns? No  Items Reviewed: Did the pt receive and understand the discharge instructions provided? Yes  Medications obtained and verified? Yes  Other? No  Any new allergies since your discharge? No  Dietary orders reviewed? Yes Do you have support at home? Yes   Home Care and Equipment/Supplies: Were home health services ordered? no If so, what is the name of the agency? na  Has the agency set up a time to come to the patient's home? not applicable Were any new equipment or medical supplies ordered?  No What is the name of the medical supply agency? na Were you able to get the supplies/equipment? not applicable Do you have any questions related to the use of the equipment or supplies? No  Functional Questionnaire: (I = Independent and D = Dependent) ADLs: I  Bathing/Dressing- I  Meal Prep- I  Eating- I  Maintaining continence- I  Transferring/Ambulation- I  Managing Meds- I  Follow up appointments reviewed:  PCP Hospital f/u appt confirmed? Yes  Scheduled to see [name] FNP on 06-14-22 @ [facility] f/u appt confirmed? No . Are transportation arrangements needed? No  If their condition worsens, is the pt aware to call PCP or go to the Emergency Dept.? Yes Was the patient provided with contact information for the PCP's office or ED? Yes Was to pt encouraged to call back with questions or concerns? Yes   [name] LPN [place] Direct Dial [phone]

## 2022-06-13 NOTE — Telephone Encounter (Signed)
Caller & Relationship to patient:  MRN #  [id number]   Call Back Number:   Date of Last Office Visit: 06/13/2022     Date of Next Office Visit: 06/14/2022    Medication(s) to be Refilled: Oxycodone  Preferred Pharmacy: [facility].  ** Please notify patient to allow 48-72 hours to process** **Let patient know to contact pharmacy at the end of the day to make sure medication is ready. ** **If patient has not been seen in a year or longer, book an appointment **Advise to use MyChart for refill requests OR to contact their pharmacy

## 2022-06-13 NOTE — Telephone Encounter (Signed)
From: Tereasa Coop To: Office of [name], [place] Sent: 06/13/2022 12:13 PM EST Subject: Medication Renewal Request  Refills have been requested for the following medications:   Oxycodone HCl 10 MG TABS [Lachina Hollis]  Preferred pharmacy: [facility], [address] AT [place] Delivery method: Arlyss Gandy

## 2022-06-14 ENCOUNTER — Ambulatory Visit: Payer: Medicaid Other | Admitting: Family Medicine

## 2022-06-14 VITALS — BP 109/68 | HR 70 | Temp 97.7°F | Wt 144.6 lb

## 2022-06-14 DIAGNOSIS — G894 Chronic pain syndrome: Secondary | ICD-10-CM | POA: Diagnosis not present

## 2022-06-14 DIAGNOSIS — M25512 Pain in left shoulder: Secondary | ICD-10-CM | POA: Diagnosis not present

## 2022-06-14 DIAGNOSIS — E559 Vitamin D deficiency, unspecified: Secondary | ICD-10-CM

## 2022-06-14 DIAGNOSIS — D5700 Hb-SS disease with crisis, unspecified: Secondary | ICD-10-CM | POA: Diagnosis not present

## 2022-06-14 MED ORDER — HYDROXYUREA 500 MG PO CAPS: 500.0000 mg | ORAL_CAPSULE | Freq: Every day | ORAL | 1 refills | Status: DC

## 2022-06-14 MED ORDER — IBUPROFEN 800 MG PO TABS: 800.0000 mg | ORAL_TABLET | Freq: Three times a day (TID) | ORAL | 0 refills | Status: DC | PRN

## 2022-06-14 NOTE — Patient Instructions (Signed)
Hydroxyurea Capsules What is this medication? HYDROXYUREA (hye drox ee yoor EE a) prevents the symptoms of sickle cell disease, such as pain crises and acute chest syndrome. It may also reduce the need for blood transfusions. It works by keeping red blood cells round and flexible, which prevents blood cells from clumping together. This also increases blood flow and the amount of oxygen that gets to your tissues. It can also be used to treat some types of cancer. It works by slowing down the growth of cancer cells. This medicine may be used for other purposes; ask your health care provider or pharmacist if you have questions. COMMON BRAND NAME(S): DROXIA, HYDREA What should I tell my care team before I take this medication? They need to know if you have any of these conditions: Gout or high levels of uric acid in the blood HIV or AIDS Kidney disease or on hemodialysis Leg wounds or ulcers Low blood counts, such as low white cell, platelet, or red cell counts Prior or current interferon therapy Recent or ongoing radiation therapy Scheduled to receive a vaccine An unusual or allergic reaction to hydroxyurea, other medications, foods, dyes, or preservatives Pregnant or trying to get pregnant Breast-feeding How should I use this medication? Take this medication by mouth with a glass of water. Take it as directed on the prescription label at the same time every day. Keep taking it unless your care team tells you to stop. Handling this medication may be harmful. Wash your hands before or after touching this medication or the bottle. Caregivers should wear gloves while touching the medication or bottle. Talk to your care team about how to handle this medication, special instructions may apply. A special MedGuide will be given to you by the pharmacist with each prescription and refill. Be sure to read this information carefully each time. Talk to your care team about the use of this medication in  children. Special care may be needed. Overdosage: If you think you have taken too much of this medicine contact a poison control center or emergency room at once. NOTE: This medicine is only for you. Do not share this medicine with others. What if I miss a dose? If you miss a dose, take it as soon as you can. If it is almost time for your next dose, take only that dose. Do not take double or extra doses. What may interact with this medication? Didanosine Live virus vaccines Stavudine This list may not describe all possible interactions. Give your health care provider a list of all the medicines, herbs, non-prescription drugs, or dietary supplements you use. Also tell them if you smoke, drink alcohol, or use illegal drugs. Some items may interact with your medicine. What should I watch for while using this medication? Visit your care team for regular checks on your progress. Tell your care team if you symptoms do not start to get better or if they get worse. This medication may make you feel generally unwell. Report any side effects. Continue your course of treatment even though you feel ill unless your care team tells you to stop. You may need blood work while taking this medication. This medication may increase your risk of getting an infection. Call your care team for advice if you get a fever, chills or sore throat, or other symptoms of a cold or flu. Do not treat yourself. Try to avoid being around people who are sick. Talk to your care team about your risk of cancer. You may  be more at risk for certain types of cancers if you take this medication. Talk to your care team if you or your partner wish to become pregnant or think either of your might be pregnant. This medication can cause serious birth defects if taken during pregnancy or for 6 months after stopping therapy. A negative pregnancy test is required before starting this medication. A reliable form of contraception is recommended while  taking this medication and for 6 months after stopping treatment. Talk to your care team about reliable forms of contraception. Men should not father a child while taking this medication and for at least 1 year after stopping it. Use a condom during sex during and for 1 year after stopping treatment. Tell your care team right away if you think you or your partner may be pregnant. This medication may cause infertility. Talk to your care team if you are concerned about your fertility. Talk to your care team before breastfeeding. Changes to your treatment plan may be needed. What side effects may I notice from receiving this medication? Side effects that you should report to your care team as soon as possible: Allergic reactions--skin rash, itching, hives, swelling of the face, lips, tongue, or throat Dry cough, shortness of breath or trouble breathing Hemolytic anemia--unusual weakness or fatigue, dizziness, headache, trouble breathing, dark urine, yellowing skin or eyes Infection--fever, chills, cough, sore throat, wounds that don't heal, pain or trouble when passing urine, general feeling of discomfort or being unwell Painful swelling, warmth, or redness of the skin, blisters or sores Unusual bruising or bleeding Side effects that usually do not require medical attention (report to your care team if they continue or are bothersome): Constipation Diarrhea Loss of appetite Nausea Pain, redness, or swelling with sores inside the mouth or throat This list may not describe all possible side effects. Call your doctor for medical advice about side effects. You may report side effects to FDA at 1-800-FDA-1088. Where should I keep my medication? Keep out of the reach of children and pets. See product for storage instructions. Each product may have different instructions. Keep the container tightly closed. Get rid of any unused medication after the expiration date. To get rid of medications that are no  longer wanted or have expired: Take the medication to a medication take-back program. Check with your pharmacy or law enforcement to find a location. If you cannot return the medication, check the label or package insert to see if the medication should be thrown out in the garbage or flushed down the toilet. If you are not sure, ask your care team. If it is safe to put it in the trash, empty the medication out of the container. Mix the medication with cat litter, dirt, coffee grounds, or other unwanted substance. Seal the mixture in a bag or container. Put it in the trash. NOTE: This sheet is a summary. It may not cover all possible information. If you have questions about this medicine, talk to your doctor, pharmacist, or health care provider.  2023 Elsevier/Gold Standard (2021-07-05 00:00:00)

## 2022-06-16 LAB — DRUG SCREEN 764883 11+OXYCO+ALC+CRT-BUND

## 2022-06-21 LAB — OXYCODONE/OXYMORPHONE, CONFIRM
OXYCODONE/OXYMORPH: POSITIVE — AB
OXYCODONE: 821 ng/mL
OXYCODONE: POSITIVE — AB
OXYMORPHONE (GC/MS): 2580 ng/mL
OXYMORPHONE: POSITIVE — AB

## 2022-06-21 LAB — DRUG SCREEN 764883 11+OXYCO+ALC+CRT-BUND
Amphetamines, Urine: NEGATIVE ng/mL
BENZODIAZ UR QL: NEGATIVE ng/mL
Barbiturate: NEGATIVE ng/mL
Cocaine (Metabolite): NEGATIVE ng/mL
Creatinine: 75.2 mg/dL (ref 20.0–300.0)
Ethanol: NEGATIVE %
Meperidine: NEGATIVE ng/mL
Methadone Screen, Urine: NEGATIVE ng/mL
Phencyclidine: NEGATIVE ng/mL
Propoxyphene: NEGATIVE ng/mL
Tramadol: NEGATIVE ng/mL
pH, Urine: 5.9 (ref 4.5–8.9)

## 2022-06-21 LAB — OPIATES CONFIRMATION, URINE
Codeine: NEGATIVE
Hydrocodone: NEGATIVE
Hydromorphone Confirm: 380 ng/mL
Hydromorphone: POSITIVE — AB
Morphine: NEGATIVE
Opiates: POSITIVE ng/mL — AB

## 2022-06-21 LAB — CANNABINOID CONFIRMATION, UR
CANNABINOIDS: POSITIVE — AB
Carboxy THC GC/MS Conf: 25 ng/mL

## 2022-06-24 ENCOUNTER — Other Ambulatory Visit: Payer: Self-pay

## 2022-06-24 DIAGNOSIS — G894 Chronic pain syndrome: Secondary | ICD-10-CM

## 2022-06-24 NOTE — Telephone Encounter (Signed)
From: Tereasa Coop To: Office of [name], [place] Sent: 06/23/2022 6:45 PM EST Subject: Medication Renewal Request  Refills have been requested for the following medications:   oxyCODONE (OXYCONTIN) 10 mg 12 hr tablet [Lachina Hollis]  Preferred pharmacy: [facility], [address] AT [place] Delivery method: Arlyss Gandy

## 2022-06-27 ENCOUNTER — Other Ambulatory Visit: Payer: Self-pay | Admitting: Family Medicine

## 2022-06-27 ENCOUNTER — Telehealth: Payer: Self-pay | Admitting: Family Medicine

## 2022-06-27 DIAGNOSIS — D5700 Hb-SS disease with crisis, unspecified: Secondary | ICD-10-CM

## 2022-06-27 DIAGNOSIS — G894 Chronic pain syndrome: Secondary | ICD-10-CM

## 2022-06-27 MED ORDER — OXYCODONE HCL ER 10 MG PO T12A: 10.0000 mg | EXTENDED_RELEASE_TABLET | Freq: Two times a day (BID) | ORAL | 0 refills | Status: DC

## 2022-06-27 MED ORDER — OXYCODONE HCL 10 MG PO TABS: 10.0000 mg | ORAL_TABLET | Freq: Four times a day (QID) | ORAL | 0 refills | Status: DC | PRN

## 2022-06-27 NOTE — Progress Notes (Signed)
Reviewed PDMP substance reporting system prior to prescribing opiate medications. No inconsistencies noted.  Meds ordered this encounter  Medications   oxyCODONE (OXYCONTIN) 10 mg 12 hr tablet    Sig: Take 1 tablet (10 mg total) by mouth every 12 (twelve) hours.    Dispense:  60 tablet    Refill:  0    Order Specific Question:   Supervising Provider    Answer:   Tresa Garter [id number]   Oxycodone HCl 10 MG TABS    Sig: Take 1 tablet (10 mg total) by mouth every 6 (six) hours as needed for up to 15 days (for moderate to severe pain).    Dispense:  60 tablet    Refill:  0    Order Specific Question:   Supervising Provider    Answer:   Tresa Garter [id number]   [name]  APRN, MSN, FNP-C [facility] [address] [place], [place] [address] [phone]

## 2022-06-27 NOTE — Telephone Encounter (Signed)
Oxycodone immediate release and Oxycotin XR

## 2022-06-27 NOTE — Telephone Encounter (Signed)
Caller & Relationship to patient:  MRN #  [id number]   Call Back Number:   Date of Last Office Visit: 06/14/2022     Date of Next Office Visit: 06/24/2022    Medication(s) to be Refilled: Oxycodone   Preferred Pharmacy:   ** Please notify patient to allow 48-72 hours to process** **Let patient know to contact pharmacy at the end of the day to make sure medication is ready. ** **If patient has not been seen in a year or longer, book an appointment **Advise to use MyChart for refill requests OR to contact their pharmacy

## 2022-06-28 ENCOUNTER — Telehealth: Payer: Self-pay

## 2022-06-28 ENCOUNTER — Other Ambulatory Visit: Payer: Self-pay

## 2022-06-28 DIAGNOSIS — D5700 Hb-SS disease with crisis, unspecified: Secondary | ICD-10-CM

## 2022-06-28 DIAGNOSIS — G894 Chronic pain syndrome: Secondary | ICD-10-CM

## 2022-06-28 NOTE — Telephone Encounter (Signed)
CONFIRMATION [id number] W

## 2022-06-28 NOTE — Telephone Encounter (Signed)
Please advise KH 

## 2022-06-28 NOTE — Telephone Encounter (Signed)
From: Tereasa Coop To: Office of [name], [place] Sent: 06/25/2022 11:24 AM EST Subject: Medication Renewal Request  Refills have been requested for the following medications:   Oxycodone HCl 10 MG TABS [Lachina Hollis]  Preferred pharmacy: [facility], [address] AT [place] Delivery method: Arlyss Gandy

## 2022-06-28 NOTE — Telephone Encounter (Signed)
Approved until 09/26/22

## 2022-06-28 NOTE — Telephone Encounter (Signed)
PRIOR AUTHORIZATION FOR OXYCONTIN SUBMITTED VIA [place] [employer] TODAY. CONFIRMATION [id number] W

## 2022-06-29 NOTE — Progress Notes (Signed)
Established Patient Office Visit  Subjective   Patient ID: Lindsey [name], female    DOB: 1970/11/26  Age: 52 y.o. MRN: [id number]  Chief Complaint  Patient presents with   Follow-up    Lindsey [name] is a 52 year old female with a medical history significant for sickle cell disease, chronic pain syndrome, opiate dependence and tolerance, and history of anemia of chronic disease presents for a post hospital follow-up.  Patient was hospitalized 2/1 - 06/11/2022 for sickle cell pain crisis.  Throughout admission, patient was treated with IV Dilaudid PCA, IV Toradol, and IV fluids.  Patient states that her pain intensity improved throughout admission.  She resumed her home medications prior to discharge.  Patient states that pain has been well-controlled on home medications since discharge.  She currently has pain in her lower extremities rated as 5/10.  She last had oxycodone this a.m. with moderate relief. Patient denies headache, chest pain, urinary symptoms, nausea, vomiting, or diarrhea.      Review of Systems  Constitutional: Negative.   HENT: Negative.    Eyes: Negative.   Respiratory: Negative.    Cardiovascular: Negative.   Gastrointestinal: Negative.   Genitourinary: Negative.   Musculoskeletal:  Positive for back pain and joint pain.  Skin: Negative.   Neurological: Negative.   Endo/Heme/Allergies: Negative.   Psychiatric/Behavioral: Negative.        Objective:     BP 109/68   Pulse 70   Temp 97.7 F (36.5 C)   Wt 144 lb 9.6 oz (65.6 kg)   LMP 07/09/2021 (Approximate)   SpO2 99%   BMI 28.24 kg/m    Physical Exam Constitutional:      Appearance: Normal appearance.  Eyes:     Pupils: Pupils are equal, round, and reactive to light.  Cardiovascular:     Rate and Rhythm: Normal rate and regular rhythm.     Pulses: Normal pulses.  Abdominal:     General: Bowel sounds are normal.  Musculoskeletal:        General: Normal range of motion.  Skin:     General: Skin is warm.  Neurological:     General: No focal deficit present.     Mental Status: She is alert. Mental status is at baseline.  Psychiatric:        Mood and Affect: Mood normal.        Behavior: Behavior normal.        Thought Content: Thought content normal.        Judgment: Judgment normal.      Results for orders placed or performed in visit on 06/14/22  [id number] 11+Oxyco+Alc+Crt-Bund  Result Value Ref Range   Ethanol Negative Cutoff=0.020 %   Amphetamines, Urine Negative Cutoff=1000 ng/mL   Barbiturate Negative Cutoff=200 ng/mL   BENZODIAZ UR QL Negative Cutoff=200 ng/mL   Cannabinoid Quant, Ur See Final Results Cutoff=50 ng/mL   Cocaine (Metabolite) Negative Cutoff=300 ng/mL   OPIATE SCREEN URINE See Final Results Cutoff=300 ng/mL   Oxycodone/Oxymorphone, Urine See Final Results Cutoff=300 ng/mL   Phencyclidine Negative Cutoff=25 ng/mL   Methadone Screen, Urine Negative Cutoff=300 ng/mL   Propoxyphene Negative Cutoff=300 ng/mL   Meperidine Negative Cutoff=200 ng/mL   Tramadol Negative Cutoff=200 ng/mL   Creatinine 75.2 20.0 - 300.0 mg/dL   pH, Urine 5.9 4.5 - 8.9  Cannabinoid Conf, Ur  Result Value Ref Range   CANNABINOIDS Positive (A) Cutoff=50   Carboxy THC GC/MS Conf 25 Cutoff=15 ng/mL  Opiates Confirmation, Urine  Result Value Ref Range  Opiates Positive (A) Cutoff=300 ng/mL     Codeine Negative Cutoff=300     Morphine Negative Cutoff=300     Hydromorphone Positive (A)       Hydromorphone Confirm 380 Cutoff=300 ng/mL     Hydrocodone Negative Cutoff=300  Oxycodone/Oxymorphone, Confirm  Result Value Ref Range   OXYCODONE/OXYMORPH Positive (A) Cutoff=300   OXYCODONE Positive (A)    OXYCODONE 821 Cutoff=300 ng/mL   OXYMORPHONE Positive (A)    OXYMORPHONE (GC/MS) 2,580 Cutoff=300 ng/mL      The ASCVD Risk score (Arnett DK, et al., 2019) failed to calculate for the following reasons:   Cannot find a previous HDL lab   Cannot find a previous total  cholesterol lab    Assessment & Plan:   Problem List Items Addressed This Visit       Other   Chronic pain   Relevant Medications   ibuprofen (ADVIL) 800 MG tablet   Other Relevant Orders   [id number] 11+Oxyco+Alc+Crt-Bund (Completed)   Other Visit Diagnoses     Sickle cell anemia with pain ([place])    -  Primary   Relevant Medications   hydroxyurea (HYDREA) 500 MG capsule   Acute pain of left shoulder       Relevant Orders   DG Shoulder Left   Vitamin D deficiency         1. Acute pain of left shoulder Patient is complaining of increased pain to left shoulder.  Recommend imaging.  Will continue current pain medication regimen. - DG Shoulder Left; Future  2. Chronic pain syndrome  - [id number] 11+Oxyco+Alc+Crt-Bund - ibuprofen (ADVIL) 800 MG tablet; Take 1 tablet (800 mg total) by mouth every 8 (eight) hours as needed.  Dispense: 30 tablet; Refill: 0  3. Vitamin D deficiency Will recheck vitamin D level at regular follow-up.  4. Sickle cell anemia with pain ([facility])  - hydroxyurea (HYDREA) 500 MG capsule; Take 1 capsule (500 mg total) by mouth daily. May take with food to minimize GI side effects.  Dispense: 90 capsule; Refill: 1   Return in about 3 months (around 09/12/2022) for sickle cell anemia.  [name]  APRN, MSN, FNP-C [facility] [address] [place], [place] [address] [phone]

## 2022-07-04 ENCOUNTER — Other Ambulatory Visit: Payer: Self-pay

## 2022-07-04 ENCOUNTER — Encounter (HOSPITAL_COMMUNITY): Payer: Self-pay | Admitting: Family Medicine

## 2022-07-04 ENCOUNTER — Inpatient Hospital Stay (HOSPITAL_COMMUNITY)
Admission: EM | Admit: 2022-07-04 | Discharge: 2022-07-08 | DRG: 812 | Disposition: A | Discharge status: Home / Self Care | Payer: Medicaid Other | Attending: Internal Medicine | Admitting: Internal Medicine

## 2022-07-04 DIAGNOSIS — Z79899 Other long term (current) drug therapy: Secondary | ICD-10-CM

## 2022-07-04 DIAGNOSIS — F1121 Opioid dependence, in remission: Secondary | ICD-10-CM | POA: Diagnosis present

## 2022-07-04 DIAGNOSIS — F32A Depression, unspecified: Secondary | ICD-10-CM | POA: Diagnosis present

## 2022-07-04 DIAGNOSIS — G894 Chronic pain syndrome: Secondary | ICD-10-CM | POA: Diagnosis present

## 2022-07-04 DIAGNOSIS — Z884 Allergy status to anesthetic agent status: Secondary | ICD-10-CM | POA: Diagnosis not present

## 2022-07-04 DIAGNOSIS — F419 Anxiety disorder, unspecified: Secondary | ICD-10-CM | POA: Diagnosis present

## 2022-07-04 DIAGNOSIS — D5700 Hb-SS disease with crisis, unspecified: Principal | ICD-10-CM

## 2022-07-04 DIAGNOSIS — G8929 Other chronic pain: Secondary | ICD-10-CM | POA: Diagnosis present

## 2022-07-04 DIAGNOSIS — M545 Low back pain, unspecified: Secondary | ICD-10-CM | POA: Diagnosis present

## 2022-07-04 DIAGNOSIS — Z1152 Encounter for screening for COVID-19: Secondary | ICD-10-CM | POA: Diagnosis not present

## 2022-07-04 DIAGNOSIS — D638 Anemia in other chronic diseases classified elsewhere: Secondary | ICD-10-CM | POA: Diagnosis present

## 2022-07-04 DIAGNOSIS — D72829 Elevated white blood cell count, unspecified: Secondary | ICD-10-CM | POA: Diagnosis present

## 2022-07-04 LAB — COMPREHENSIVE METABOLIC PANEL
ALT: 12 U/L (ref 0–44)
AST: 39 U/L (ref 15–41)
Albumin: 4.2 g/dL (ref 3.5–5.0)
Alkaline Phosphatase: 35 U/L — ABNORMAL LOW (ref 38–126)
Anion gap: 7 (ref 5–15)
BUN: 14 mg/dL (ref 6–20)
CO2: 25 mmol/L (ref 22–32)
Calcium: 8.6 mg/dL — ABNORMAL LOW (ref 8.9–10.3)
Chloride: 109 mmol/L (ref 98–111)
Creatinine, Ser: 0.94 mg/dL (ref 0.44–1.00)
GFR, Estimated: >60 mL/min (ref >60)
Glucose, Bld: 104 mg/dL — ABNORMAL HIGH (ref 70–99)
Potassium: 4.8 mmol/L (ref 3.5–5.1)
Sodium: 141 mmol/L (ref 135–145)
Total Bilirubin: 1.3 mg/dL — ABNORMAL HIGH (ref 0.3–1.2)
Total Protein: 7.8 g/dL (ref 6.5–8.1)

## 2022-07-04 LAB — CBC WITH DIFFERENTIAL/PLATELET
Abs Immature Granulocytes: 0.03 10*3/uL (ref 0.00–0.07)
Basophils Absolute: 0 10*3/uL (ref 0.0–0.1)
Basophils Relative: 0 %
Eosinophils Absolute: 0.4 10*3/uL (ref 0.0–0.5)
Eosinophils Relative: 4 %
HCT: 28.7 % — ABNORMAL LOW (ref 36.0–46.0)
Hemoglobin: 10.3 g/dL — ABNORMAL LOW (ref 12.0–15.0)
Immature Granulocytes: 0 %
Lymphocytes Relative: 36 %
Lymphs Abs: 4.3 10*3/uL — ABNORMAL HIGH (ref 0.7–4.0)
MCH: 29.8 pg (ref 26.0–34.0)
MCHC: 35.9 g/dL (ref 30.0–36.0)
MCV: 82.9 fL (ref 80.0–100.0)
Monocytes Absolute: 0.8 10*3/uL (ref 0.1–1.0)
Monocytes Relative: 7 %
Neutro Abs: 6.4 10*3/uL (ref 1.7–7.7)
Neutrophils Relative %: 53 %
Platelets: 386 10*3/uL (ref 150–400)
RBC: 3.46 MIL/uL — ABNORMAL LOW (ref 3.87–5.11)
RDW: 15.9 % — ABNORMAL HIGH (ref 11.5–15.5)
WBC: 12 10*3/uL — ABNORMAL HIGH (ref 4.0–10.5)
nRBC: 0.3 % — ABNORMAL HIGH (ref 0.0–0.2)

## 2022-07-04 LAB — RETICULOCYTES
Immature Retic Fract: 22.2 % — ABNORMAL HIGH (ref 2.3–15.9)
RBC.: 3.43 MIL/uL — ABNORMAL LOW (ref 3.87–5.11)
Retic Count, Absolute: 141.3 10*3/uL (ref 19.0–186.0)
Retic Ct Pct: 4.1 % — ABNORMAL HIGH (ref 0.4–3.1)

## 2022-07-04 LAB — TYPE AND SCREEN
ABO/RH(D): A POS
Antibody Screen: NEGATIVE

## 2022-07-04 LAB — I-STAT BETA HCG BLOOD, ED (MC, WL, AP ONLY): I-stat hCG, quantitative: <5 m[IU]/mL (ref <5)

## 2022-07-04 MED ORDER — ONDANSETRON HCL 4 MG/2ML IJ SOLN: 4.0000 mg | INTRAMUSCULAR | Status: DC | PRN

## 2022-07-04 MED ORDER — ONDANSETRON 4 MG PO TBDP
4.0000 mg | ORAL_TABLET | Freq: Once | ORAL | Status: AC
  Administered 2022-07-04: 4 mg via ORAL
  Filled 2022-07-04: qty 1

## 2022-07-04 MED ORDER — ENOXAPARIN SODIUM 40 MG/0.4ML IJ SOSY
40.0000 mg | PREFILLED_SYRINGE | INTRAMUSCULAR | Status: DC
  Administered 2022-07-04 – 2022-07-07 (×4): 40 mg via SUBCUTANEOUS
  Filled 2022-07-04 (×4): qty 0.4

## 2022-07-04 MED ORDER — OXYCODONE HCL ER 10 MG PO T12A
10.0000 mg | EXTENDED_RELEASE_TABLET | Freq: Two times a day (BID) | ORAL | Status: DC
  Administered 2022-07-04 – 2022-07-08 (×8): 10 mg via ORAL
  Filled 2022-07-04 (×8): qty 1

## 2022-07-04 MED ORDER — HYDROMORPHONE HCL 2 MG/ML IJ SOLN
2.0000 mg | INTRAMUSCULAR | Status: DC | PRN
  Administered 2022-07-04 – 2022-07-05 (×3): 2 mg via INTRAVENOUS
  Filled 2022-07-04 (×3): qty 1

## 2022-07-04 MED ORDER — DIPHENHYDRAMINE HCL 25 MG PO CAPS: 25.0000 mg | ORAL_CAPSULE | ORAL | Status: DC | PRN

## 2022-07-04 MED ORDER — SODIUM CHLORIDE 0.9 % IV SOLN
12.5000 mg | Freq: Once | INTRAVENOUS | Status: AC
  Administered 2022-07-04: 12.5 mg via INTRAVENOUS
  Filled 2022-07-04: qty 12.5

## 2022-07-04 MED ORDER — OXYCODONE HCL 5 MG PO TABS
10.0000 mg | ORAL_TABLET | Freq: Once | ORAL | Status: AC
  Administered 2022-07-04: 10 mg via ORAL
  Filled 2022-07-04: qty 2

## 2022-07-04 MED ORDER — HYDROMORPHONE HCL 1 MG/ML IJ SOLN: 0.5000 mg | INTRAMUSCULAR | Status: DC

## 2022-07-04 MED ORDER — HYDROMORPHONE HCL 2 MG/ML IJ SOLN
2.0000 mg | INTRAMUSCULAR | Status: AC
  Administered 2022-07-04: 2 mg via INTRAVENOUS
  Filled 2022-07-04: qty 1

## 2022-07-04 MED ORDER — HYDROXYUREA 500 MG PO CAPS
500.0000 mg | ORAL_CAPSULE | Freq: Every day | ORAL | Status: DC
  Filled 2022-07-04 (×5): qty 1

## 2022-07-04 MED ORDER — KETOROLAC TROMETHAMINE 15 MG/ML IJ SOLN
15.0000 mg | Freq: Four times a day (QID) | INTRAMUSCULAR | Status: DC
  Administered 2022-07-04 – 2022-07-08 (×16): 15 mg via INTRAVENOUS
  Filled 2022-07-04 (×16): qty 1

## 2022-07-04 MED ORDER — KETOROLAC TROMETHAMINE 15 MG/ML IJ SOLN
15.0000 mg | INTRAMUSCULAR | Status: AC
  Administered 2022-07-04: 15 mg via INTRAVENOUS
  Filled 2022-07-04: qty 1

## 2022-07-04 MED ORDER — GABAPENTIN 400 MG PO CAPS
400.0000 mg | ORAL_CAPSULE | Freq: Three times a day (TID) | ORAL | Status: DC
  Administered 2022-07-04 – 2022-07-08 (×12): 400 mg via ORAL
  Filled 2022-07-04 (×12): qty 1

## 2022-07-04 MED ORDER — SODIUM CHLORIDE 0.45 % IV SOLN: INTRAVENOUS | Status: DC

## 2022-07-04 MED ORDER — SODIUM CHLORIDE 0.9% FLUSH: 9.0000 mL | INTRAVENOUS | Status: DC | PRN

## 2022-07-04 MED ORDER — POLYETHYLENE GLYCOL 3350 17 G PO PACK: 17.0000 g | PACK | Freq: Every day | ORAL | Status: DC | PRN

## 2022-07-04 MED ORDER — HYDROMORPHONE HCL 1 MG/ML IJ SOLN
0.5000 mg | INTRAMUSCULAR | Status: DC
  Filled 2022-07-04: qty 1

## 2022-07-04 MED ORDER — SENNOSIDES-DOCUSATE SODIUM 8.6-50 MG PO TABS
1.0000 | ORAL_TABLET | Freq: Two times a day (BID) | ORAL | Status: DC
  Administered 2022-07-04 – 2022-07-07 (×6): 1 via ORAL
  Filled 2022-07-04 (×8): qty 1

## 2022-07-04 MED ORDER — HYDROMORPHONE 1 MG/ML IV SOLN
INTRAVENOUS | Status: DC
  Administered 2022-07-04 (×2): 5 mg via INTRAVENOUS
  Administered 2022-07-04: 30 mg via INTRAVENOUS
  Administered 2022-07-05: 2.5 mg via INTRAVENOUS
  Administered 2022-07-05: 30 mg via INTRAVENOUS
  Administered 2022-07-05: 3.5 mg via INTRAVENOUS
  Administered 2022-07-05: 5.5 mg via INTRAVENOUS
  Administered 2022-07-05: 3.5 mg via INTRAVENOUS
  Administered 2022-07-06: 3 mg via INTRAVENOUS
  Administered 2022-07-06: 2 mg via INTRAVENOUS
  Administered 2022-07-06 (×2): 3.5 mg via INTRAVENOUS
  Administered 2022-07-06: 4 mg via INTRAVENOUS
  Administered 2022-07-06: 0.5 mg via INTRAVENOUS
  Administered 2022-07-07: 3 mg via INTRAVENOUS
  Administered 2022-07-07: 2 mg via INTRAVENOUS
  Administered 2022-07-07: 5 mg via INTRAVENOUS
  Administered 2022-07-07: 4 mg via INTRAVENOUS
  Administered 2022-07-07: 30 mg via INTRAVENOUS
  Administered 2022-07-08: 4 mg via INTRAVENOUS
  Administered 2022-07-08: 6 mg via INTRAVENOUS
  Filled 2022-07-04 (×3): qty 30

## 2022-07-04 MED ORDER — NALOXONE HCL 0.4 MG/ML IJ SOLN: 0.4000 mg | INTRAMUSCULAR | Status: DC | PRN

## 2022-07-04 MED ORDER — FOLIC ACID 1 MG PO TABS
1.0000 mg | ORAL_TABLET | Freq: Every day | ORAL | Status: DC
  Administered 2022-07-04 – 2022-07-08 (×4): 1 mg via ORAL
  Filled 2022-07-04 (×4): qty 1

## 2022-07-04 MED ORDER — ONDANSETRON HCL 4 MG/2ML IJ SOLN: 4.0000 mg | Freq: Four times a day (QID) | INTRAMUSCULAR | Status: DC | PRN

## 2022-07-04 NOTE — ED Provider Notes (Addendum)
[facility] Provider Note   CSN: [id number] Arrival date & time: 07/04/22  1221     History  Chief Complaint  Patient presents with   Sickle Cell Pain Crisis    Lindsey [name] is a 52 y.o. female.   Sickle Cell Pain Crisis    Patient has a history of sickle cell disease, chronic pain syndrome, who presents to the ED with complaints of sickle cell pain.  Patient states she started having symptoms 3 days ago.  She is having pain primarily in her lower back.  She has tried home medications including chronic opiates without relief.  Patient denies any chest pain.  No shortness of breath.  No fevers or chills.  No dysuria  Home Medications Prior to Admission medications   Medication Sig Start Date End Date Taking? Authorizing Provider  carboxymethylcellulose (REFRESH PLUS) 0.5 % SOLN Place 1 drop into both eyes in the morning, at noon, and at bedtime.    [provider]  folic acid (FOLVITE) 1 MG tablet Take 1 tablet (1 mg total) by mouth daily. 12/31/21 12/31/22  Elwyn Reach, MD  gabapentin (NEURONTIN) 400 MG capsule Take 1 capsule (400 mg total) by mouth 3 (three) times daily. 04/07/22   Dorena Dew, FNP  hydroxyurea (HYDREA) 500 MG capsule Take 1 capsule (500 mg total) by mouth daily. May take with food to minimize GI side effects. 06/14/22   Dorena Dew, FNP  ibuprofen (ADVIL) 800 MG tablet Take 1 tablet (800 mg total) by mouth every 8 (eight) hours as needed. 06/14/22   Dorena Dew, FNP  oxyCODONE (OXYCONTIN) 10 mg 12 hr tablet Take 1 tablet (10 mg total) by mouth every 12 (twelve) hours. 06/28/22   Dorena Dew, FNP  Oxycodone HCl 10 MG TABS Take 1 tablet (10 mg total) by mouth every 6 (six) hours as needed for up to 15 days (for moderate to severe pain). 06/28/22 07/13/22  Dorena Dew, FNP  TYLENOL 8 HOUR ARTHRITIS PAIN 650 MG CR tablet Take 650 mg by mouth every 8 (eight) hours as needed for  pain. Patient not taking: Reported on 06/13/2022    [provider]      Allergies    Ketamine    Review of Systems   Review of Systems  Physical Exam Updated Vital Signs BP 139/81   Pulse 98   Temp 98.7 F (37.1 C) (Oral)   Resp 20   Ht 1.524 m (5')   Wt 63.5 kg   LMP 07/09/2021 (Approximate)   SpO2 100%   BMI 27.34 kg/m  Physical Exam Vitals and nursing note reviewed.  Constitutional:      General: She is in acute distress.     Appearance: She is well-developed. She is ill-appearing. She is not diaphoretic.     Comments: Appears to be in pain  HENT:     Head: Normocephalic and atraumatic.     Right Ear: External ear normal.     Left Ear: External ear normal.  Eyes:     General: No scleral icterus.       Right eye: No discharge.        Left eye: No discharge.     Conjunctiva/sclera: Conjunctivae normal.  Neck:     Trachea: No tracheal deviation.  Cardiovascular:     Rate and Rhythm: Normal rate and regular rhythm.  Pulmonary:     Effort: Pulmonary effort is normal. No  respiratory distress.     Breath sounds: Normal breath sounds. No stridor. No wheezing or rales.  Abdominal:     General: Bowel sounds are normal. There is no distension.     Palpations: Abdomen is soft.     Tenderness: There is no abdominal tenderness. There is no guarding or rebound.  Musculoskeletal:        General: No tenderness or deformity.     Cervical back: Neck supple.     Lumbar back: No swelling or tenderness.  Skin:    General: Skin is warm and dry.     Findings: No rash.  Neurological:     General: No focal deficit present.     Mental Status: She is alert.     Cranial Nerves: No cranial nerve deficit, dysarthria or facial asymmetry.     Sensory: No sensory deficit.     Motor: No abnormal muscle tone or seizure activity.     Coordination: Coordination normal.  Psychiatric:        Mood and Affect: Mood normal.     ED Results / Procedures / Treatments   Labs (all  labs ordered are listed, but only abnormal results are displayed) Labs Reviewed  COMPREHENSIVE METABOLIC PANEL - Abnormal; Notable for the following components:      Result Value   Glucose, Bld 104 (*)    Calcium 8.6 (*)    Alkaline Phosphatase 35 (*)    Total Bilirubin 1.3 (*)    All other components within normal limits  CBC WITH DIFFERENTIAL/PLATELET - Abnormal; Notable for the following components:   WBC 12.0 (*)    RBC 3.46 (*)    Hemoglobin 10.3 (*)    HCT 28.7 (*)    RDW 15.9 (*)    nRBC 0.3 (*)    Lymphs Abs 4.3 (*)    All other components within normal limits  RETICULOCYTES - Abnormal; Notable for the following components:   Retic Ct Pct 4.1 (*)    RBC. 3.43 (*)    Immature Retic Fract 22.2 (*)    All other components within normal limits  I-STAT BETA HCG BLOOD, ED ([facility], [facility], [facility] ONLY)  TYPE AND SCREEN    EKG None  Radiology No results found.  Procedures .Critical Care  Performed by: Dorie Rank, MD Authorized by: Dorie Rank, MD   Critical care provider statement:    Critical care time (minutes):  30   Critical care was time spent personally by me on the following activities:  Development of treatment plan with patient or surrogate, discussions with consultants, evaluation of patient's response to treatment, examination of patient, ordering and review of laboratory studies, ordering and review of radiographic studies, ordering and performing treatments and interventions, pulse oximetry, re-evaluation of patient's condition and review of old charts     Medications Ordered in ED Medications  0.45 % sodium chloride infusion ( Intravenous New Bag/Given 07/04/22 1312)  ondansetron (ZOFRAN) injection 4 mg (has no administration in time range)  ondansetron (ZOFRAN-ODT) disintegrating tablet 4 mg (4 mg Oral Given 07/04/22 1249)  oxyCODONE (Oxy IR/ROXICODONE) immediate release tablet 10 mg (10 mg Oral Given 07/04/22 1249)  ketorolac (TORADOL) 15 MG/ML injection 15 mg (15 mg  Intravenous Given 07/04/22 1306)  HYDROmorphone (DILAUDID) injection 2 mg (2 mg Intravenous Given 07/04/22 1305)  HYDROmorphone (DILAUDID) injection 2 mg (2 mg Intravenous Given 07/04/22 1329)  HYDROmorphone (DILAUDID) injection 2 mg (2 mg Intravenous Given 07/04/22 1430)  diphenhydrAMINE (BENADRYL) 12.5 mg in sodium chloride  0.9 % 50 mL IVPB (12.5 mg Intravenous New Bag/Given 07/04/22 1322)    ED Course/ Medical Decision Making/ A&P Clinical Course as of 07/04/22 1514  Mon Jul 04, 2022  1451 CBC with Differential(!) Anemia stable.  Metabolic panel unremarkable. [id number] Case discussed with FNP Smith Robert regarding admission [JK]    Clinical Course User Index [JK] Dorie Rank, MD                             Medical Decision Making Problems Addressed: Sickle cell pain crisis [facility]): acute illness or injury that poses a threat to life or bodily functions  Amount and/or Complexity of Data Reviewed Labs: ordered. Decision-making details documented in ED Course.  Risk Prescription drug management. Parenteral controlled substances. Drug therapy requiring intensive monitoring for toxicity. Decision regarding hospitalization.   Patient presents ED for evaluation of sickle cell pain crisis.  Labs without signs of anemia.  No acute electrolyte abnormalities.  Patient unfortunately continues to have severe pain despite 3 rounds of IV narcotic pain medications.  Patient feels that she requires admission to the hospital for further treatment.  I will consult with the sickle service for further treatment.        Final Clinical Impression(s) / ED Diagnoses Final diagnoses:  Sickle cell pain crisis [facility])    Rx / DC Orders ED Discharge Orders     None         Dorie Rank, MD 07/04/22 1513    Dorie Rank, MD 07/04/22 [phone]

## 2022-07-04 NOTE — H&P (Signed)
Sickle Cell Medical History and Physical   Date: 07/04/2022  Patient name: Lindsey [name] Medical record number: [id number] Date of birth: [birth year] Age: 52 y.o. Gender: female PCP: Dorena Dew, FNP  Attending physician: Tresa Garter, MD  Chief Complaint: Sickle cell pain   History of Present Illness: Lindsey [name] is a 52 year old female with a medical history significant for sickle cell disease, chronic pain syndrome, opiate dependence and tolerance, and frequent hospitilazations presents with complaints of worsening back pain over the past 3 days.  Patient reports that pain is consistent with previous sickle cell pain crisis.  Patient also endorses pain to her lower extremities.  She has been taking home medications without very much relief.  Patient takes oxycodone and OxyContin for home pain management.  She attributes pain crisis to increase stressors at home and weather changes.  Currently, patient's pain intensity is 10/10, constant, and sharp.  Patient denies any fever, chills, chest pain, or shortness of breath.  No nausea, vomiting, or diarrhea.  No sick contacts, recent travel, or known exposure to COVID-19.  ER course: While in ER, patient's vital signs remained stable.  Complete blood count shows WBCs 12, hemoglobin 10.3, and platelets 386,000.  Complete metabolic panel shows total bilirubin 1.3, otherwise unremarkable.  Absolute reticulocyte count 141.3.  Beta-hCG negative.  Patient's pain persists despite IV Dilaudid, IV Toradol, and IV fluids.  Patient will be admitted for further management of her sickle cell crisis.  Meds: Medications Prior to Admission  Medication Sig Dispense Refill Last Dose   carboxymethylcellulose (REFRESH PLUS) 0.5 % SOLN Place 1 drop into both eyes daily.   Past Week   folic acid (FOLVITE) 1 MG tablet Take 1 tablet (1 mg total) by mouth daily. 30 tablet 11    gabapentin (NEURONTIN) 400 MG capsule Take 1 capsule (400 mg total) by  mouth 3 (three) times daily. 90 capsule 5    hydroxyurea (HYDREA) 500 MG capsule Take 1 capsule (500 mg total) by mouth daily. May take with food to minimize GI side effects. (Patient not taking: Reported on 07/04/2022) 90 capsule 1 Not Taking   ibuprofen (ADVIL) 800 MG tablet Take 1 tablet (800 mg total) by mouth every 8 (eight) hours as needed. 30 tablet 0    oxyCODONE (OXYCONTIN) 10 mg 12 hr tablet Take 1 tablet (10 mg total) by mouth every 12 (twelve) hours. 60 tablet 0    Oxycodone HCl 10 MG TABS Take 1 tablet (10 mg total) by mouth every 6 (six) hours as needed for up to 15 days (for moderate to severe pain). 60 tablet 0    TYLENOL 8 HOUR ARTHRITIS PAIN 650 MG CR tablet Take 650 mg by mouth every 8 (eight) hours as needed for pain. (Patient not taking: Reported on 06/13/2022)       Allergies: Ketamine Past Medical History:  Diagnosis Date   Acute kidney injury ([place]) 01/18/2021   Acute pain of left shoulder    Cough 06/27/2020   Hypokalemia 06/27/2020   Hypoxia    Leukocytosis 09/03/2016   Opiate abuse, episodic ([place]) 09/25/2017   Opioid dependence in remission ([place])    Right leg pain    Sickle cell crisis ([place])    Vasoocclusive sickle cell crisis ([place]) 07/17/2021   Past Surgical History:  Procedure Laterality Date   APPENDECTOMY     CESAREAN SECTION     OTHER SURGICAL HISTORY     c-section   Family History  Problem Relation Age  of Onset   Stroke Neg Hx        none that she knows of    Seizures Neg Hx    Social History   Socioeconomic History   Marital status: Married    Spouse name: Not on file   Number of children: 2   Years of education: Not on file   Highest education level: Not on file  Occupational History   Not on file  Tobacco Use   Smoking status: Some Days    Packs/day: 0.00    Types: Cigars, Cigarettes   Smokeless tobacco: Never  Vaping Use   Vaping Use: Former  Substance and Sexual Activity   Alcohol use: Yes    Comment: occasionally   Drug use: No    Sexual activity: Yes    Birth control/protection: None  Other Topics Concern   Not on file  Social History Narrative   Lives at home with husband and son   Right handed   Social Determinants of Health   Financial Resource Strain: Not on file  Food Insecurity: No Food Insecurity (07/04/2022)   Hunger Vital Sign    Worried About Running Out of Food in the Last Year: Never true    Ran Out of Food in the Last Year: Never true  Transportation Needs: No Transportation Needs (07/04/2022)   PRAPARE - Hydrologist (Medical): No    Lack of Transportation (Non-Medical): No  Physical Activity: Not on file  Stress: Not on file  Social Connections: Not on file  Intimate Partner Violence: Not At Risk (07/04/2022)   Humiliation, Afraid, Rape, and Kick questionnaire    Fear of Current or Ex-Partner: No    Emotionally Abused: No    Physically Abused: No    Sexually Abused: No   Review of Systems  Constitutional: Negative.   HENT: Negative.    Eyes: Negative.   Respiratory: Negative.    Cardiovascular: Negative.   Gastrointestinal: Negative.   Genitourinary: Negative.   Musculoskeletal:  Positive for back pain and joint pain.  Skin: Negative.   Neurological: Negative.   Endo/Heme/Allergies: Negative.   Psychiatric/Behavioral: Negative.      Physical Exam: Blood pressure 108/70, pulse 93, temperature 98.1 F (36.7 C), temperature source Oral, resp. rate 12, height 5' (1.524 m), weight 63.5 kg, last menstrual period 07/09/2021, SpO2 93 %. Physical Exam Constitutional:      Appearance: Normal appearance.  Eyes:     Pupils: Pupils are equal, round, and reactive to light.  Cardiovascular:     Rate and Rhythm: Normal rate and regular rhythm.  Pulmonary:     Effort: Pulmonary effort is normal.  Abdominal:     General: Bowel sounds are normal.  Skin:    General: Skin is warm.  Neurological:     General: No focal deficit present.     Mental Status: She is  alert. Mental status is at baseline.  Psychiatric:        Attention and Perception: Attention and perception normal.        Mood and Affect: Mood normal. Affect is tearful.        Behavior: Behavior normal.        Thought Content: Thought content normal.        Judgment: Judgment normal.      Lab results: Results for orders placed or performed during the hospital encounter of 07/04/22 (from the past 24 hour(s))  Comprehensive metabolic panel     Status:  Abnormal   Collection Time: 07/04/22  1:09 PM  Result Value Ref Range   Sodium 141 135 - 145 mmol/L   Potassium 4.8 3.5 - 5.1 mmol/L   Chloride 109 98 - 111 mmol/L   CO2 25 22 - 32 mmol/L   Glucose, Bld 104 (H) 70 - 99 mg/dL   BUN 14 6 - 20 mg/dL   Creatinine, Ser 0.94 0.44 - 1.00 mg/dL   Calcium 8.6 (L) 8.9 - 10.3 mg/dL   Total Protein 7.8 6.5 - 8.1 g/dL   Albumin 4.2 3.5 - 5.0 g/dL   AST 39 15 - 41 U/L   ALT 12 0 - 44 U/L   Alkaline Phosphatase 35 (L) 38 - 126 U/L   Total Bilirubin 1.3 (H) 0.3 - 1.2 mg/dL   GFR, Estimated >60 >60 mL/min   Anion gap 7 5 - 15  CBC with Differential     Status: Abnormal   Collection Time: 07/04/22  1:09 PM  Result Value Ref Range   WBC 12.0 (H) 4.0 - 10.5 K/uL   RBC 3.46 (L) 3.87 - 5.11 MIL/uL   Hemoglobin 10.3 (L) 12.0 - 15.0 g/dL   HCT 28.7 (L) 36.0 - 46.0 %   MCV 82.9 80.0 - 100.0 fL   MCH 29.8 26.0 - 34.0 pg   MCHC 35.9 30.0 - 36.0 g/dL   RDW 15.9 (H) 11.5 - 15.5 %   Platelets 386 150 - 400 K/uL   nRBC 0.3 (H) 0.0 - 0.2 %   Neutrophils Relative % 53 %   Neutro Abs 6.4 1.7 - 7.7 K/uL   Lymphocytes Relative 36 %   Lymphs Abs 4.3 (H) 0.7 - 4.0 K/uL   Monocytes Relative 7 %   Monocytes Absolute 0.8 0.1 - 1.0 K/uL   Eosinophils Relative 4 %   Eosinophils Absolute 0.4 0.0 - 0.5 K/uL   Basophils Relative 0 %   Basophils Absolute 0.0 0.0 - 0.1 K/uL   Immature Granulocytes 0 %   Abs Immature Granulocytes 0.03 0.00 - 0.07 K/uL  Reticulocytes     Status: Abnormal   Collection Time:  07/04/22  1:09 PM  Result Value Ref Range   Retic Ct Pct 4.1 (H) 0.4 - 3.1 %   RBC. 3.43 (L) 3.87 - 5.11 MIL/uL   Retic Count, Absolute 141.3 19.0 - 186.0 K/uL   Immature Retic Fract 22.2 (H) 2.3 - 15.9 %  I-Stat beta hCG blood, ED     Status: None   Collection Time: 07/04/22  1:18 PM  Result Value Ref Range   I-stat hCG, quantitative <5.0 <5 mIU/mL   Comment 3          Type and screen [place]     Status: None   Collection Time: 07/04/22  1:26 PM  Result Value Ref Range   ABO/RH(D) A POS    Antibody Screen NEG    Sample Expiration      07/07/2022,2359 Performed at [facility], [place] [address], [employer], [place] [address]     Imaging results:  No results found.   Assessment & Plan:  Sickle cell disease with pain crisis: Admit.  Initiate IV Dilaudid PCA with settings of 0.5 mg, 10-minute lockout, and 3 mg/h. Toradol 15 mg IV every 6 hours OxyContin 10 mg every 12 hours Hold oxycodone, restart as pain intensity improves.  IV fluids, 0.45% saline at 100 mL/h Monitor vital signs very closely, reevaluate pain scale regularly, and supplemental oxygen  as needed. Patient will be reevaluated for pain in the context of function and relationship to baseline as care progresses.  Leukocytosis: WBCs 12.  More than likely secondary to sickle cell crisis.  Monitor closely.  Labs in AM.  Anemia of chronic disease: Patient's hemoglobin is stable and consistent with her baseline.  There is no clinical indication for blood transfusion at this time.  Monitor closely.  Chronic pain syndrome: Continue home medications  VTE: Lovenox and SCDs  [name]  APRN, MSN, FNP-C [facility] [address] [place], [place] [address] [phone]  07/04/2022, 7:27 PM

## 2022-07-04 NOTE — ED Triage Notes (Signed)
Sickle cell flare up that started 3 days ago. Little relief with warm baths, heating pads, and home pain medications. Pain is in pt lower back, worse when breathing.

## 2022-07-05 ENCOUNTER — Inpatient Hospital Stay (HOSPITAL_COMMUNITY): Payer: Medicaid Other

## 2022-07-05 DIAGNOSIS — D5700 Hb-SS disease with crisis, unspecified: Secondary | ICD-10-CM | POA: Diagnosis not present

## 2022-07-05 LAB — RESPIRATORY PANEL BY PCR

## 2022-07-05 LAB — CBC
HCT: 25.3 % — ABNORMAL LOW (ref 36.0–46.0)
Hemoglobin: 9.2 g/dL — ABNORMAL LOW (ref 12.0–15.0)
MCH: 30.6 pg (ref 26.0–34.0)
MCHC: 36.4 g/dL — ABNORMAL HIGH (ref 30.0–36.0)
MCV: 84.1 fL (ref 80.0–100.0)
Platelets: 316 10*3/uL (ref 150–400)
RBC: 3.01 MIL/uL — ABNORMAL LOW (ref 3.87–5.11)
RDW: 16.8 % — ABNORMAL HIGH (ref 11.5–15.5)
WBC: 11.7 10*3/uL — ABNORMAL HIGH (ref 4.0–10.5)
nRBC: 0.3 % — ABNORMAL HIGH (ref 0.0–0.2)

## 2022-07-05 MED ORDER — CYCLOBENZAPRINE HCL 5 MG PO TABS
5.0000 mg | ORAL_TABLET | Freq: Three times a day (TID) | ORAL | Status: DC | PRN
  Administered 2022-07-05 – 2022-07-08 (×3): 5 mg via ORAL
  Filled 2022-07-05 (×3): qty 1

## 2022-07-05 MED ORDER — ACETAMINOPHEN 325 MG PO TABS
650.0000 mg | ORAL_TABLET | Freq: Four times a day (QID) | ORAL | Status: DC | PRN
  Administered 2022-07-05 (×2): 650 mg via ORAL
  Filled 2022-07-05 (×2): qty 2

## 2022-07-05 MED ORDER — OXYCODONE HCL 5 MG PO TABS
10.0000 mg | ORAL_TABLET | Freq: Four times a day (QID) | ORAL | Status: DC | PRN
  Administered 2022-07-05 – 2022-07-08 (×7): 10 mg via ORAL
  Filled 2022-07-05 (×7): qty 2

## 2022-07-05 MED ORDER — MELATONIN 3 MG PO TABS
3.0000 mg | ORAL_TABLET | Freq: Once | ORAL | Status: AC
  Administered 2022-07-05: 3 mg via ORAL
  Filled 2022-07-05: qty 1

## 2022-07-05 NOTE — Progress Notes (Signed)
Subjective: Lindsey [name] is a 52 year old female with a medical history significant for sickle cell disease, chronic pain syndrome, opiate dependence and tolerance, and frequent hospitalizations was admitted for sickle cell pain crisis.  Patient continues to have significant pain to low back and lower extremities.  She rates her pain as 7/10.  Patient also endorses fatigue, headache and sore throat today.  Patient afebrile.  She denies any chest pain, shortness of breath, urinary symptoms, nausea, vomiting, or diarrhea.  Objective:  Vital signs in last 24 hours:  Vitals:   07/05/22 0428 07/05/22 0438 07/05/22 1115 07/05/22 1524  BP:  135/85 92/61 (!) 109/53  Pulse:  70 (!) 54 (!) 59  Resp: '13 14 14 15  '$ Temp:  97.9 F (36.6 C) 98.3 F (36.8 C) 98 F (36.7 C)  TempSrc:  Oral Oral Oral  SpO2: 100% 100% 100% 91%  Weight:      Height:        Intake/Output from previous day:   Intake/Output Summary (Last 24 hours) at 07/05/2022 1555 Last data filed at 07/05/2022 1200 Gross per 24 hour  Intake 575.21 ml  Output 1 ml  Net 574.21 ml    Physical Exam: General: Alert, awake, oriented x3, in no acute distress.  HEENT: [place]/AT PEERL, EOMI Neck: Trachea midline,  no masses, no thyromegal,y no JVD, no carotid bruit OROPHARYNX:  Moist, No exudate/ erythema/lesions.  Heart: Regular rate and rhythm, without murmurs, rubs, gallops, PMI non-displaced, no heaves or thrills on palpation.  Lungs: Clear to auscultation, no wheezing or rhonchi noted. No increased vocal fremitus resonant to percussion  Abdomen: Soft, nontender, nondistended, positive bowel sounds, no masses no hepatosplenomegaly noted..  Neuro: No focal neurological deficits noted cranial nerves II through XII grossly intact. DTRs 2+ bilaterally upper and lower extremities. Strength 5 out of 5 in bilateral upper and lower extremities. Musculoskeletal: No warm swelling or erythema around joints, no spinal tenderness  noted. Psychiatric: Patient alert and oriented x3, good insight and cognition, good recent to remote recall. Lymph node survey: No cervical axillary or inguinal lymphadenopathy noted.  Lab Results:  Basic Metabolic Panel:    Component Value Date/Time   NA 141 07/04/2022 1309   NA 142 05/20/2021 1101   K 4.8 07/04/2022 1309   CL 109 07/04/2022 1309   CO2 25 07/04/2022 1309   BUN 14 07/04/2022 1309   BUN 10 05/20/2021 1101   CREATININE 0.94 07/04/2022 1309   GLUCOSE 104 (H) 07/04/2022 1309   CALCIUM 8.6 (L) 07/04/2022 1309   CBC:    Component Value Date/Time   WBC 11.7 (H) 07/05/2022 0653   HGB 9.2 (L) 07/05/2022 0653   HGB 11.0 (L) 05/20/2021 1101   HCT 25.3 (L) 07/05/2022 0653   HCT 31.3 (L) 05/20/2021 1101   PLT 316 07/05/2022 0653   PLT 509 (H) 05/20/2021 1101   MCV 84.1 07/05/2022 0653   MCV 88 05/20/2021 1101   NEUTROABS 6.4 07/04/2022 1309   NEUTROABS 5.4 05/20/2021 1101   LYMPHSABS 4.3 (H) 07/04/2022 1309   LYMPHSABS 2.5 05/20/2021 1101   MONOABS 0.8 07/04/2022 1309   EOSABS 0.4 07/04/2022 1309   EOSABS 0.3 05/20/2021 1101   BASOSABS 0.0 07/04/2022 1309   BASOSABS 0.1 05/20/2021 1101    No results found for this or any previous visit (from the past 240 hour(s)).  Studies/Results: No results found.  Medications: Scheduled Meds:  enoxaparin (LOVENOX) injection  40 mg Subcutaneous [id number]   folic acid  1 mg Oral Daily  gabapentin  400 mg Oral TID   HYDROmorphone   Intravenous Q4H   hydroxyurea  500 mg Oral Daily   ketorolac  15 mg Intravenous Q6H   oxyCODONE  10 mg Oral Q12H   senna-docusate  1 tablet Oral BID   Continuous Infusions:  sodium chloride 10 mL/hr at 07/05/22 1207   PRN Meds:.acetaminophen, diphenhydrAMINE, naloxone **AND** sodium chloride flush, ondansetron (ZOFRAN) IV, polyethylene glycol  Consultants: none  Procedures: none  Antibiotics: none  Assessment/Plan: Active Problems:   Sickle cell pain crisis (HCC)   Sickle cell  disease with pain crisis: Decrease IV fluids to KVO Continue IV Dilaudid PCA no changes in settings Toradol 15 mg IV every 6 hours OxyContin 10 mg every 12 hours Restart oxycodone 10 mg every 6 hours as needed for severe breakthrough pain Monitor vital signs very closely, reevaluate pain scale regularly, and supplemental oxygen as needed  Chronic pain syndrome: Continue home medications  Anemia of chronic disease: Hemoglobin is stable and consistent with patient's baseline.  There is no clinical indication for blood transfusion at this time.  Sore throat pain: Follow respiratory panel.  Droplet precautions.  R/O COVID-19.  Patient continues to be afebrile  Leukocytosis: Mild leukocytosis.  Secondary to sickle cell disease.  Continue to monitor closely.   Code Status: Full Code Family Communication: N/A Disposition Plan: Not yet ready for discharge  [place], MSN, FNP-C [facility] [address] [place], [place] [address] [phone]  If 7PM-7AM, please contact night-coverage.  07/05/2022, 3:55 PM  LOS: 1 day

## 2022-07-06 DIAGNOSIS — D5700 Hb-SS disease with crisis, unspecified: Secondary | ICD-10-CM | POA: Diagnosis not present

## 2022-07-06 NOTE — Progress Notes (Signed)
Subjective: Lindsey [name] is a 52 year old female with a medical history significant for sickle cell disease, chronic pain syndrome, opiate dependence and tolerance, and history of anemia of chronic disease was admitted for sickle cell pain crisis.  Patient continues to have pain primarily to low back and lower extremities. Patient rates pain as 8/10, constant and throbbing.   Patient denies headache, chest pain, shortness of breath, nausea, vomiting, and diarrhea.   Objective:  Vital signs in last 24 hours:  Vitals:   07/06/22 1509 07/06/22 1832 07/06/22 2003 07/06/22 2028  BP:  124/81  (!) 102/58  Pulse:  66  66  Resp: '18 15 12 18  '$ Temp:  98.5 F (36.9 C)  98.3 F (36.8 C)  TempSrc:  Oral  Oral  SpO2:  91% 92% 94%  Weight:      Height:        Intake/Output from previous day:   Intake/Output Summary (Last 24 hours) at 07/06/2022 2047 Last data filed at 07/06/2022 [id number] Gross per 24 hour  Intake 1360.72 ml  Output --  Net 1360.72 ml    Physical Exam: General: Alert, awake, oriented x3, in no acute distress.  HEENT: [place]/AT PEERL, EOMI Neck: Trachea midline,  no masses, no thyromegal,y no JVD, no carotid bruit OROPHARYNX:  Moist, No exudate/ erythema/lesions.  Heart: Regular rate and rhythm, without murmurs, rubs, gallops, PMI non-displaced, no heaves or thrills on palpation.  Lungs: Clear to auscultation, no wheezing or rhonchi noted. No increased vocal fremitus resonant to percussion  Abdomen: Soft, nontender, nondistended, positive bowel sounds, no masses no hepatosplenomegaly noted..  Neuro: No focal neurological deficits noted cranial nerves II through XII grossly intact. DTRs 2+ bilaterally upper and lower extremities. Strength 5 out of 5 in bilateral upper and lower extremities. Musculoskeletal: No warm swelling or erythema around joints, no spinal tenderness noted. Psychiatric: Patient alert and oriented x3, good insight and cognition, good recent to remote  recall. Lymph node survey: No cervical axillary or inguinal lymphadenopathy noted.  Lab Results:  Basic Metabolic Panel:    Component Value Date/Time   NA 141 07/04/2022 1309   NA 142 05/20/2021 1101   K 4.8 07/04/2022 1309   CL 109 07/04/2022 1309   CO2 25 07/04/2022 1309   BUN 14 07/04/2022 1309   BUN 10 05/20/2021 1101   CREATININE 0.94 07/04/2022 1309   GLUCOSE 104 (H) 07/04/2022 1309   CALCIUM 8.6 (L) 07/04/2022 1309   CBC:    Component Value Date/Time   WBC 11.7 (H) 07/05/2022 0653   HGB 9.2 (L) 07/05/2022 0653   HGB 11.0 (L) 05/20/2021 1101   HCT 25.3 (L) 07/05/2022 0653   HCT 31.3 (L) 05/20/2021 1101   PLT 316 07/05/2022 0653   PLT 509 (H) 05/20/2021 1101   MCV 84.1 07/05/2022 0653   MCV 88 05/20/2021 1101   NEUTROABS 6.4 07/04/2022 1309   NEUTROABS 5.4 05/20/2021 1101   LYMPHSABS 4.3 (H) 07/04/2022 1309   LYMPHSABS 2.5 05/20/2021 1101   MONOABS 0.8 07/04/2022 1309   EOSABS 0.4 07/04/2022 1309   EOSABS 0.3 05/20/2021 1101   BASOSABS 0.0 07/04/2022 1309   BASOSABS 0.1 05/20/2021 1101    Recent Results (from the past 240 hour(s))  Respiratory (~20 pathogens) panel by PCR     Status: None   Collection Time: 07/05/22 11:42 AM   Specimen: Nasopharyngeal Swab; Respiratory  Result Value Ref Range Status   Adenovirus NOT DETECTED NOT DETECTED Final   Coronavirus 229E NOT DETECTED  NOT DETECTED Final    Comment: (NOTE) The Coronavirus on the Respiratory Panel, DOES NOT test for the novel  Coronavirus (2019 nCoV)    Coronavirus HKU1 NOT DETECTED NOT DETECTED Final   Coronavirus NL63 NOT DETECTED NOT DETECTED Final   Coronavirus OC43 NOT DETECTED NOT DETECTED Final   Metapneumovirus NOT DETECTED NOT DETECTED Final   Rhinovirus / Enterovirus NOT DETECTED NOT DETECTED Final   Influenza A NOT DETECTED NOT DETECTED Final   Influenza B NOT DETECTED NOT DETECTED Final   Parainfluenza Virus 1 NOT DETECTED NOT DETECTED Final   Parainfluenza Virus 2 NOT DETECTED NOT  DETECTED Final   Parainfluenza Virus 3 NOT DETECTED NOT DETECTED Final   Parainfluenza Virus 4 NOT DETECTED NOT DETECTED Final   Respiratory Syncytial Virus NOT DETECTED NOT DETECTED Final   Bordetella pertussis NOT DETECTED NOT DETECTED Final   Bordetella Parapertussis NOT DETECTED NOT DETECTED Final   Chlamydophila pneumoniae NOT DETECTED NOT DETECTED Final   Mycoplasma pneumoniae NOT DETECTED NOT DETECTED Final    Comment: Performed at [facility], [place] [address] [place], [place] [address]    Studies/Results: DG Lumbar Spine Complete  Result Date: 07/05/2022 CLINICAL DATA:  Sickle cell crisis, lower back pain, bilateral lower extremity radiculopathy EXAM: LUMBAR SPINE - COMPLETE 4+ VIEW COMPARISON:  None Available. FINDINGS: Frontal, bilateral oblique, and lateral views of the lumbar spine are obtained. There are 5 non-rib-bearing lumbar type vertebral bodies identified, with mild right convex curvature centered at L4-5. Otherwise alignment is anatomic. No acute fractures. There is L4-5 and L5-S1 spondylosis and facet hypertrophy. Remaining disc spaces are well preserved. Sacroiliac joints are normal. IMPRESSION: 1. Prominent spondylosis and facet hypertrophy at L4-5 and L5-S1. 2. No acute bony abnormality. Electronically Signed   By: Randa Ngo M.D.   On: 07/05/2022 19:36    Medications: Scheduled Meds:  enoxaparin (LOVENOX) injection  40 mg Subcutaneous [id number]   folic acid  1 mg Oral Daily   gabapentin  400 mg Oral TID   HYDROmorphone   Intravenous Q4H   hydroxyurea  500 mg Oral Daily   ketorolac  15 mg Intravenous Q6H   oxyCODONE  10 mg Oral Q12H   senna-docusate  1 tablet Oral BID   Continuous Infusions:  sodium chloride 10 mL/hr at 07/06/22 1852   PRN Meds:.acetaminophen, cyclobenzaprine, diphenhydrAMINE, naloxone **AND** sodium chloride flush, ondansetron (ZOFRAN) IV, oxyCODONE, polyethylene  glycol  Consultants: none  Procedures: none  Antibiotics: none  Assessment/Plan: Principal Problem:   Sickle cell pain crisis (HCC) Active Problems:   Chronic pain   Anemia of chronic disease   Anxiety and depression   Sickle cell disease with pain crisis:  Continue IV dilaudid PCA Toradol 15 mg every 6 hours  Oxycontin 10 mg every 12 hours Monitor vital signs very closely, evaluate pain scale regularly, and supplemental oxygen as needed.   Chronic pain syndrome:  Continue home medications  Anemia of chronic disease: Patient's hemoglobin is stable and consistent with her baseline.  There is no clinical indication for blood transfusion at this time.    Code Status: Full Code Family Communication: N/A Disposition Plan: Not yet ready for discharge  [place], MSN, FNP-C [facility] [place] [address] [place], [place] [address] [phone]  If 7PM-7AM, please contact night-coverage.  07/06/2022, 8:47 PM  LOS: 2 days

## 2022-07-06 NOTE — TOC Progression Note (Signed)
Transition of Care [facility]) - Progression Note    Patient Details  Name: Lindsey [name] MRN: [id number] Date of Birth: 06/05/1970  Transition of Care [facility]) CM/SW [place], RN Phone Number:[phone]  07/06/2022, 8:25 AM  Clinical Narrative:     Transition of Care [facility]) Screening Note   Patient Details  Name: Lindsey [name] Date of Birth: 1970/12/12   Transition of Care [facility]) CM/SW Contact:    Angelita Ingles, RN Phone Number: 07/06/2022, 8:25 AM    Transition of Care Department [facility]) has reviewed patient and no TOC needs have been identified at this time. We will continue to monitor patient advancement through interdisciplinary progression rounds. If new patient transition needs arise, please place a TOC consult.          Expected Discharge Plan and Services                                               Social Determinants of Health (SDOH) Interventions SDOH Screenings   Food Insecurity: No Food Insecurity (07/04/2022)  Housing: Low Risk  (07/04/2022)  Transportation Needs: No Transportation Needs (07/04/2022)  Utilities: Not At Risk (07/04/2022)  Recent Concern: Utilities - At Risk (04/06/2022)  Depression (PHQ2-9): Low Risk  (06/14/2022)  Tobacco Use: High Risk (07/04/2022)    Readmission Risk Interventions    02/14/2022   10:02 AM 11/30/2021   10:59 AM 10/29/2021    1:04 PM  Readmission Risk Prevention Plan  Transportation Screening Complete Complete Complete  PCP or Specialist Appt within 3-5 Days   Complete  HRI or [place]   Complete  Social Work Consult for [place] Planning/Counseling   Complete  Palliative Care Screening   Not Applicable  Medication Review Press photographer) Complete Complete Complete  PCP or Specialist appointment within 3-5 days of discharge Complete Complete   HRI or [place] Complete Complete   SW Recovery Care/Counseling Consult Complete Complete   Palliative Care Screening  Not Applicable Not [place] Not Applicable Not Applicable

## 2022-07-06 NOTE — Progress Notes (Addendum)
Patient turned CO2 monitor off from PCA pump off, primary RN found tubing on the floor. The channel was turned off.   When education was provided regarding order for continuous CO2 monitoring, patient became defensive and agitated.   Stating, "I heard you already, you said it like three times".  Clarification was obtained when patient verbalized understanding.    Charge RN informed.

## 2022-07-08 DIAGNOSIS — D5700 Hb-SS disease with crisis, unspecified: Secondary | ICD-10-CM | POA: Diagnosis not present

## 2022-07-08 MED ORDER — PHENOL 1.4 % MT LIQD
1.0000 | OROMUCOSAL | Status: DC | PRN
  Filled 2022-07-08: qty 177

## 2022-07-08 MED ORDER — GUAIFENESIN-DM 100-10 MG/5ML PO SYRP
10.0000 mL | ORAL_SOLUTION | ORAL | Status: DC | PRN
  Filled 2022-07-08: qty 10

## 2022-07-08 NOTE — Plan of Care (Addendum)
Patient alert and oriented X4, came in for sickle cell pain crisis, IV HM PCA disconnected, IV removed, all questions answered and ride to come in 20 mins, no further concerns at this time. Discharge order placed and awaiting family member to come pick her up.  Problem: Education: Goal: Knowledge of vaso-occlusive preventative measures will improve Outcome: Completed/Met Goal: Awareness of infection prevention will improve Outcome: Completed/Met Goal: Awareness of signs and symptoms of anemia will improve Outcome: Completed/Met Goal: Long-term complications will improve Outcome: Completed/Met   Problem: Self-Care: Goal: Ability to incorporate actions that prevent/reduce pain crisis will improve Outcome: Completed/Met   Problem: Bowel/Gastric: Goal: Gut motility will be maintained Outcome: Completed/Met   Problem: Tissue Perfusion: Goal: Complications related to inadequate tissue perfusion will be avoided or minimized Outcome: Completed/Met   Problem: Respiratory: Goal: Pulmonary complications will be avoided or minimized Outcome: Completed/Met Goal: Acute Chest Syndrome will be identified early to prevent complications Outcome: Completed/Met   Problem: Fluid Volume: Goal: Ability to maintain a balanced intake and output will improve Outcome: Completed/Met   Problem: Sensory: Goal: Pain level will decrease with appropriate interventions Outcome: Completed/Met   Problem: Health Behavior: Goal: Postive changes in compliance with treatment and prescription regimens will improve Outcome: Completed/Met   Problem: Education: Goal: Knowledge of General Education information will improve Description: Including pain rating scale, medication(s)/side effects and non-pharmacologic comfort measures Outcome: Completed/Met   Problem: Health Behavior/Discharge Planning: Goal: Ability to manage health-related needs will improve Outcome: Completed/Met   Problem: Clinical  Measurements: Goal: Ability to maintain clinical measurements within normal limits will improve Outcome: Completed/Met Goal: Will remain free from infection Outcome: Completed/Met Goal: Diagnostic test results will improve Outcome: Completed/Met Goal: Respiratory complications will improve Outcome: Completed/Met Goal: Cardiovascular complication will be avoided Outcome: Completed/Met   Problem: Activity: Goal: Risk for activity intolerance will decrease Outcome: Completed/Met   Problem: Nutrition: Goal: Adequate nutrition will be maintained Outcome: Completed/Met   Problem: Coping: Goal: Level of anxiety will decrease Outcome: Completed/Met   Problem: Elimination: Goal: Will not experience complications related to bowel motility Outcome: Completed/Met Goal: Will not experience complications related to urinary retention Outcome: Completed/Met   Problem: Pain Managment: Goal: General experience of comfort will improve Outcome: Completed/Met   Problem: Safety: Goal: Ability to remain free from injury will improve Outcome: Completed/Met   Problem: Skin Integrity: Goal: Risk for impaired skin integrity will decrease Outcome: Completed/Met

## 2022-07-08 NOTE — Discharge Summary (Signed)
Physician Discharge Summary  Lindsey [name] [id number] DOB: 01/22/1971 DOA: 07/04/2022  PCP: Dorena Dew, FNP  Admit date: 07/04/2022  Discharge date: 07/08/2022  Discharge Diagnoses:  Principal Problem:   Sickle cell pain crisis ([place]) Active Problems:   Chronic pain   Anemia of chronic disease   Anxiety and depression   Discharge Condition: Stable  Disposition:  Pt is discharged home in good condition and is to follow up with Dorena Dew, FNP this week to have labs evaluated. Lindsey [name] is instructed to increase activity slowly and balance with rest for the next few days, and use prescribed medication to complete treatment of pain  Diet: Regular Wt Readings from Last 3 Encounters:  07/04/22 63.5 kg  06/14/22 65.6 kg  06/10/22 66.9 kg   History of Present Illness: Lindsey [name] is a 52 year old female with a medical history significant for sickle cell disease, chronic pain syndrome, opiate dependence and tolerance, and frequent hospitilazations presents with complaints of worsening back pain over the past 3 days.  Patient reports that pain is consistent with previous sickle cell pain crisis.  Patient also endorses pain to her lower extremities.  She has been taking home medications without very much relief.  Patient takes oxycodone and OxyContin for home pain management.  She attributes pain crisis to increase stressors at home and weather changes.  Currently, patient's pain intensity is 10/10, constant, and sharp.  Patient denies any fever, chills, chest pain, or shortness of breath.  No nausea, vomiting, or diarrhea.  No sick contacts, recent travel, or known exposure to COVID-19.   ER course: While in ER, patient's vital signs remained stable.  Complete blood count shows WBCs 12, hemoglobin 10.3, and platelets 386,000.  Complete metabolic panel shows total bilirubin 1.3, otherwise unremarkable.  Absolute reticulocyte count 141.3.  Beta-hCG negative.   Patient's pain persists despite IV Dilaudid, IV Toradol, and IV fluids.  Patient will be admitted for further management of her sickle cell crisis.   Hospital Course:  Sickle cell disease with pain crisis:  Patient was admitted for sickle cell pain crisis and managed appropriately with IVF, IV Dilaudid via PCA and IV Toradol, as well as other adjunct therapies per sickle cell pain management protocols.  Patient was therefore discharged home today in a hemodynamically stable condition.   Lillia will follow-up with PCP within 1 week of this discharge. [name] was counseled extensively about nonpharmacologic means of pain management, patient verbalized understanding and was appreciative of  the care received during this admission.   We discussed the need for good hydration, monitoring of hydration status, avoidance of heat, cold, stress, and infection triggers. We discussed the need to be adherent with taking home medications. Patient was reminded of the need to seek medical attention immediately if any symptom of bleeding, anemia, or infection occurs.  Discharge Exam: Vitals:   07/08/22 0950 07/08/22 1253  BP: 120/86   Pulse: (!) 53   Resp: 16 10  Temp: 98.3 F (36.8 C)   SpO2: 100% 98%   Vitals:   07/08/22 0556 07/08/22 0828 07/08/22 0950 07/08/22 1253  BP: 117/76  120/86   Pulse: (!) 48  (!) 53   Resp: '14 16 16 10  '$ Temp: 98.6 F (37 C)  98.3 F (36.8 C)   TempSrc: Oral  Oral   SpO2: 92% 94% 100% 98%  Weight:      Height:        General appearance : Awake, alert, not  in any distress. Speech Clear. Not toxic looking HEENT: Atraumatic and Normocephalic, pupils equally reactive to light and accomodation Neck: Supple, no JVD. No cervical lymphadenopathy.  Chest: Good air entry bilaterally, no added sounds  CVS: S1 S2 regular, no murmurs.  Abdomen: Bowel sounds present, Non tender and not distended with no gaurding, rigidity or rebound. Extremities: B/L Lower Ext shows no  edema, both legs are warm to touch Neurology: Awake alert, and oriented X 3, CN II-XII intact, Non focal Skin: No Rash  Discharge Instructions  Discharge Instructions     Discharge patient   Complete by: As directed    Discharge disposition: 01-Home or Self Care   Discharge patient date: 07/08/2022      Allergies as of 07/08/2022       Reactions   Ketamine Anxiety, Other (See Comments)   Tachycardia        Medication List     TAKE these medications    carboxymethylcellulose 0.5 % Soln Commonly known as: REFRESH PLUS Place 1 drop into both eyes daily as needed (dry eyes).   folic acid 1 MG tablet Commonly known as: FOLVITE Take 1 tablet (1 mg total) by mouth daily.   gabapentin 400 MG capsule Commonly known as: NEURONTIN Take 1 capsule (400 mg total) by mouth 3 (three) times daily.   hydroxyurea 500 MG capsule Commonly known as: HYDREA Take 1 capsule (500 mg total) by mouth daily. May take with food to minimize GI side effects.   ibuprofen 800 MG tablet Commonly known as: ADVIL Take 1 tablet (800 mg total) by mouth every 8 (eight) hours as needed. What changed: reasons to take this   oxyCODONE 10 mg 12 hr tablet Commonly known as: OXYCONTIN Take 1 tablet (10 mg total) by mouth every 12 (twelve) hours.   Oxycodone HCl 10 MG Tabs Take 1 tablet (10 mg total) by mouth every 6 (six) hours as needed for up to 15 days (for moderate to severe pain).   Tylenol 8 Hour Arthritis Pain 650 MG CR tablet Generic drug: acetaminophen Take 650 mg by mouth every 8 (eight) hours as needed for pain.        The results of significant diagnostics from this hospitalization (including imaging, microbiology, ancillary and laboratory) are listed below for reference.    Significant Diagnostic Studies: DG Lumbar Spine Complete  Result Date: 07/05/2022 CLINICAL DATA:  Sickle cell crisis, lower back pain, bilateral lower extremity radiculopathy EXAM: LUMBAR SPINE - COMPLETE 4+ VIEW  COMPARISON:  None Available. FINDINGS: Frontal, bilateral oblique, and lateral views of the lumbar spine are obtained. There are 5 non-rib-bearing lumbar type vertebral bodies identified, with mild right convex curvature centered at L4-5. Otherwise alignment is anatomic. No acute fractures. There is L4-5 and L5-S1 spondylosis and facet hypertrophy. Remaining disc spaces are well preserved. Sacroiliac joints are normal. IMPRESSION: 1. Prominent spondylosis and facet hypertrophy at L4-5 and L5-S1. 2. No acute bony abnormality. Electronically Signed   By: Randa Ngo M.D.   On: 07/05/2022 19:36    Microbiology: Recent Results (from the past 240 hour(s))  Respiratory (~20 pathogens) panel by PCR     Status: None   Collection Time: 07/05/22 11:42 AM   Specimen: Nasopharyngeal Swab; Respiratory  Result Value Ref Range Status   Adenovirus NOT DETECTED NOT DETECTED Final   Coronavirus 229E NOT DETECTED NOT DETECTED Final    Comment: (NOTE) The Coronavirus on the Respiratory Panel, DOES NOT test for the novel  Coronavirus (2019 nCoV)  Coronavirus HKU1 NOT DETECTED NOT DETECTED Final   Coronavirus NL63 NOT DETECTED NOT DETECTED Final   Coronavirus OC43 NOT DETECTED NOT DETECTED Final   Metapneumovirus NOT DETECTED NOT DETECTED Final   Rhinovirus / Enterovirus NOT DETECTED NOT DETECTED Final   Influenza A NOT DETECTED NOT DETECTED Final   Influenza B NOT DETECTED NOT DETECTED Final   Parainfluenza Virus 1 NOT DETECTED NOT DETECTED Final   Parainfluenza Virus 2 NOT DETECTED NOT DETECTED Final   Parainfluenza Virus 3 NOT DETECTED NOT DETECTED Final   Parainfluenza Virus 4 NOT DETECTED NOT DETECTED Final   Respiratory Syncytial Virus NOT DETECTED NOT DETECTED Final   Bordetella pertussis NOT DETECTED NOT DETECTED Final   Bordetella Parapertussis NOT DETECTED NOT DETECTED Final   Chlamydophila pneumoniae NOT DETECTED NOT DETECTED Final   Mycoplasma pneumoniae NOT DETECTED NOT DETECTED Final     Comment: Performed at [facility], [place] [address], [place], [place] [address]     Labs: Basic Metabolic Panel: Recent Labs  Lab 07/04/22 1309  NA 141  K 4.8  CL 109  CO2 25  GLUCOSE 104*  BUN 14  CREATININE 0.94  CALCIUM 8.6*   Liver Function Tests: Recent Labs  Lab 07/04/22 1309  AST 39  ALT 12  ALKPHOS 35*  BILITOT 1.3*  PROT 7.8  ALBUMIN 4.2   No results for input(s): "LIPASE", "AMYLASE" in the last 168 hours. No results for input(s): "AMMONIA" in the last 168 hours. CBC: Recent Labs  Lab 07/04/22 1309 07/05/22 0653  WBC 12.0* 11.7*  NEUTROABS 6.4  --   HGB 10.3* 9.2*  HCT 28.7* 25.3*  MCV 82.9 84.1  PLT 386 316   Cardiac Enzymes: No results for input(s): "CKTOTAL", "CKMB", "CKMBINDEX", "TROPONINI" in the last 168 hours. BNP: Invalid input(s): "POCBNP" CBG: No results for input(s): "GLUCAP" in the last 168 hours.  Time coordinating discharge: 30 minutes  Signed:  [name]  APRN, MSN, FNP-C Patient [facility] [address] [place], [place] [address] [phone]  [facility] 07/08/2022, 7:13 PM

## 2022-07-11 ENCOUNTER — Other Ambulatory Visit: Payer: Self-pay

## 2022-07-11 ENCOUNTER — Telehealth: Payer: Self-pay

## 2022-07-11 DIAGNOSIS — G894 Chronic pain syndrome: Secondary | ICD-10-CM

## 2022-07-11 DIAGNOSIS — D5700 Hb-SS disease with crisis, unspecified: Secondary | ICD-10-CM

## 2022-07-11 MED ORDER — OXYCODONE HCL 10 MG PO TABS: 10.0000 mg | ORAL_TABLET | Freq: Four times a day (QID) | ORAL | 0 refills | Status: DC | PRN

## 2022-07-11 NOTE — Transitions of Care (Post Inpatient/ED Visit) (Signed)
   07/11/2022  Name: Lindsey [name] MRN: [id number] DOB: 29-Oct-1970  Today's TOC FU Call Status: Today's TOC FU Call Status:: Successful TOC FU Call Competed TOC FU Call Complete Date: 07/11/22  Transition Care Management Follow-up Telephone Call Date of Discharge: 07/08/22 Discharge Facility: [facility]) Type of Discharge: Inpatient Admission Primary Inpatient Discharge Diagnosis:: Sickle cell pain crisis How have you been since you were released from the hospital?: Better Any questions or concerns?: No  Items Reviewed: Did you receive and understand the discharge instructions provided?: Yes Medications obtained and verified?: Yes (Medications Reviewed) Any new allergies since your discharge?: No Dietary orders reviewed?: NA Do you have support at home?: Yes People in Home: significant other  Home Care and Equipment/Supplies: [facility] Ordered?: No Any new equipment or medical supplies ordered?: No  Functional Questionnaire: Do you need assistance with bathing/showering or dressing?: No Do you need assistance with meal preparation?: No Do you need assistance with eating?: No Do you have difficulty maintaining continence: No Do you need assistance with getting out of bed/getting out of a chair/moving?: No Do you have difficulty managing or taking your medications?: No  Folllow up appointments reviewed: PCP Follow-up appointment confirmed?: Yes Date of PCP follow-up appointment?: 07/08/22 Follow-up Provider: [name], [facility] Follow-up appointment confirmed?: NA Do you need transportation to your follow-up appointment?: No Do you understand care options if your condition(s) worsen?: Yes-patient verbalized understanding    Willard LPN [place] Direct Dial [phone]

## 2022-07-11 NOTE — Telephone Encounter (Signed)
Reviewed PDMP substance reporting system prior to prescribing opiate medications. No inconsistencies noted.  Meds ordered this encounter  Medications   Oxycodone HCl 10 MG TABS    Sig: Take 1 tablet (10 mg total) by mouth every 6 (six) hours as needed for up to 15 days (for moderate to severe pain).    Dispense:  60 tablet    Refill:  0   [name]  APRN, MSN, FNP-C Patient [facility] [address] [place], [place] [address] [phone]

## 2022-07-11 NOTE — Telephone Encounter (Signed)
From: Tereasa Coop To: Office of [name], [place] Sent: 07/11/2022 11:39 AM EST Subject: Medication Renewal Request  Refills have been requested for the following medications:   Oxycodone HCl 10 MG TABS [Lachina Hollis]  Preferred pharmacy: [facility], [address] AT [place] Delivery method: Arlyss Gandy

## 2022-07-11 NOTE — Telephone Encounter (Signed)
Please advise KH 

## 2022-07-12 ENCOUNTER — Inpatient Hospital Stay: Payer: Self-pay | Admitting: Family Medicine

## 2022-07-12 ENCOUNTER — Other Ambulatory Visit: Payer: Self-pay | Admitting: Family Medicine

## 2022-07-12 NOTE — Telephone Encounter (Signed)
Please advise Kh

## 2022-07-25 ENCOUNTER — Non-Acute Institutional Stay (HOSPITAL_COMMUNITY)
Admission: AD | Admit: 2022-07-25 | Discharge: 2022-07-25 | Disposition: A | Discharge status: Home / Self Care | Payer: Medicaid Other | Attending: Internal Medicine | Admitting: Internal Medicine

## 2022-07-25 ENCOUNTER — Telehealth (HOSPITAL_COMMUNITY): Payer: Self-pay | Admitting: *Deleted

## 2022-07-25 DIAGNOSIS — G894 Chronic pain syndrome: Secondary | ICD-10-CM | POA: Insufficient documentation

## 2022-07-25 DIAGNOSIS — D638 Anemia in other chronic diseases classified elsewhere: Secondary | ICD-10-CM | POA: Diagnosis not present

## 2022-07-25 DIAGNOSIS — F1120 Opioid dependence, uncomplicated: Secondary | ICD-10-CM | POA: Diagnosis not present

## 2022-07-25 DIAGNOSIS — D5700 Hb-SS disease with crisis, unspecified: Secondary | ICD-10-CM | POA: Diagnosis present

## 2022-07-25 LAB — COMPREHENSIVE METABOLIC PANEL
ALT: 27 U/L (ref 0–44)
AST: 48 U/L — ABNORMAL HIGH (ref 15–41)
Albumin: 4.6 g/dL (ref 3.5–5.0)
Alkaline Phosphatase: 37 U/L — ABNORMAL LOW (ref 38–126)
Anion gap: 8 (ref 5–15)
BUN: 12 mg/dL (ref 6–20)
CO2: 25 mmol/L (ref 22–32)
Calcium: 9 mg/dL (ref 8.9–10.3)
Chloride: 105 mmol/L (ref 98–111)
Creatinine, Ser: 0.91 mg/dL (ref 0.44–1.00)
GFR, Estimated: >60 mL/min (ref >60)
Glucose, Bld: 84 mg/dL (ref 70–99)
Potassium: 5.2 mmol/L — ABNORMAL HIGH (ref 3.5–5.1)
Sodium: 138 mmol/L (ref 135–145)
Total Bilirubin: 1.2 mg/dL (ref 0.3–1.2)
Total Protein: 8.1 g/dL (ref 6.5–8.1)

## 2022-07-25 LAB — CBC WITH DIFFERENTIAL/PLATELET
Abs Immature Granulocytes: 0.04 10*3/uL (ref 0.00–0.07)
Basophils Absolute: 0.1 10*3/uL (ref 0.0–0.1)
Basophils Relative: 1 %
Eosinophils Absolute: 0.3 10*3/uL (ref 0.0–0.5)
Eosinophils Relative: 3 %
HCT: 29.1 % — ABNORMAL LOW (ref 36.0–46.0)
Hemoglobin: 10.4 g/dL — ABNORMAL LOW (ref 12.0–15.0)
Immature Granulocytes: 0 %
Lymphocytes Relative: 31 %
Lymphs Abs: 3.2 10*3/uL (ref 0.7–4.0)
MCH: 30.4 pg (ref 26.0–34.0)
MCHC: 35.7 g/dL (ref 30.0–36.0)
MCV: 85.1 fL (ref 80.0–100.0)
Monocytes Absolute: 0.7 10*3/uL (ref 0.1–1.0)
Monocytes Relative: 7 %
Neutro Abs: 5.9 10*3/uL (ref 1.7–7.7)
Neutrophils Relative %: 58 %
Platelets: 487 10*3/uL — ABNORMAL HIGH (ref 150–400)
RBC: 3.42 MIL/uL — ABNORMAL LOW (ref 3.87–5.11)
RDW: 15.5 % (ref 11.5–15.5)
WBC: 10.2 10*3/uL (ref 4.0–10.5)
nRBC: 0.5 % — ABNORMAL HIGH (ref 0.0–0.2)

## 2022-07-25 LAB — RETICULOCYTES
Immature Retic Fract: 29.3 % — ABNORMAL HIGH (ref 2.3–15.9)
RBC.: 3.37 MIL/uL — ABNORMAL LOW (ref 3.87–5.11)
Retic Count, Absolute: 156.7 10*3/uL (ref 19.0–186.0)
Retic Ct Pct: 4.7 % — ABNORMAL HIGH (ref 0.4–3.1)

## 2022-07-25 MED ORDER — NALOXONE HCL 0.4 MG/ML IJ SOLN: 0.4000 mg | INTRAMUSCULAR | Status: DC | PRN

## 2022-07-25 MED ORDER — KETOROLAC TROMETHAMINE 30 MG/ML IJ SOLN
15.0000 mg | Freq: Once | INTRAMUSCULAR | Status: AC
  Administered 2022-07-25: 15 mg via INTRAVENOUS
  Filled 2022-07-25: qty 1

## 2022-07-25 MED ORDER — SODIUM CHLORIDE 0.45 % IV SOLN: INTRAVENOUS | Status: DC

## 2022-07-25 MED ORDER — SODIUM CHLORIDE 0.9% FLUSH: 9.0000 mL | INTRAVENOUS | Status: DC | PRN

## 2022-07-25 MED ORDER — ACETAMINOPHEN 500 MG PO TABS
1000.0000 mg | ORAL_TABLET | Freq: Once | ORAL | Status: AC
  Administered 2022-07-25: 1000 mg via ORAL
  Filled 2022-07-25: qty 2

## 2022-07-25 MED ORDER — DIPHENHYDRAMINE HCL 25 MG PO CAPS: 25.0000 mg | ORAL_CAPSULE | ORAL | Status: DC | PRN

## 2022-07-25 MED ORDER — HYDROMORPHONE 1 MG/ML IV SOLN
INTRAVENOUS | Status: DC
  Administered 2022-07-25: 30 mg via INTRAVENOUS
  Administered 2022-07-25: 11 mg via INTRAVENOUS
  Filled 2022-07-25: qty 30

## 2022-07-25 MED ORDER — ONDANSETRON HCL 4 MG/2ML IJ SOLN: 4.0000 mg | Freq: Four times a day (QID) | INTRAMUSCULAR | Status: DC | PRN

## 2022-07-25 NOTE — Progress Notes (Signed)
Patient admitted to the day infusion hospital for sickle cell pain. Patient initially reported left leg pain rated 5/10. For pain management, patient placed on Sickle Cell Dose Dilaudid PCA, given IV Toradol, Tylenol and hydrated with IV fluids. At discharge, patient rated pain 3/10. Vital signs stable. AVS offered but patient refused. Patient alert, oriented and ambulatory at discharge.

## 2022-07-25 NOTE — H&P (Signed)
[facility] History and Physical   Date: 07/25/2022  Patient name: Lindsey [name] Medical record number: [id number] Date of birth: July 30, 1970 Age: 52 y.o. Gender: female PCP: Dorena Dew, FNP  Attending physician: Tresa Garter, MD  Chief Complaint: Sickle cell pain  History of present illness:  Lindsey [name] is a 52 year old  female with a medical history significant for sickle cell disease,chronic pain syndrome,opiate dependence and tolerance, and history of anemia of chronic disease presents with complaints of upper and lower extremity pain over the past several days. Pain unrelieved by patient's home medications. She last had oxycodone and oxycontin this am without very much relief. She rates pain as 10/10, constant, and throbbing. Denies fever, chills, chest pain, headache. No nausea, vomiting, or diarrhea. No recent travel or sick contacts.   Meds: Medications Prior to Admission  Medication Sig Dispense Refill Last Dose   carboxymethylcellulose (REFRESH PLUS) 0.5 % SOLN Place 1 drop into both eyes daily as needed (dry eyes).      folic acid (FOLVITE) 1 MG tablet Take 1 tablet (1 mg total) by mouth daily. 30 tablet 11    gabapentin (NEURONTIN) 400 MG capsule Take 1 capsule (400 mg total) by mouth 3 (three) times daily. 90 capsule 5    hydroxyurea (HYDREA) 500 MG capsule Take 1 capsule (500 mg total) by mouth daily. May take with food to minimize GI side effects. (Patient not taking: Reported on 07/11/2022) 90 capsule 1    ibuprofen (ADVIL) 800 MG tablet Take 1 tablet (800 mg total) by mouth every 8 (eight) hours as needed. (Patient taking differently: Take 800 mg by mouth every 8 (eight) hours as needed for moderate pain.) 30 tablet 0    oxyCODONE (OXYCONTIN) 10 mg 12 hr tablet Take 1 tablet (10 mg total) by mouth every 12 (twelve) hours. 60 tablet 0    Oxycodone HCl 10 MG TABS Take 1 tablet (10 mg total) by mouth every 6 (six) hours as needed for up to  15 days (for moderate to severe pain). 60 tablet 0    TYLENOL 8 HOUR ARTHRITIS PAIN 650 MG CR tablet Take 650 mg by mouth every 8 (eight) hours as needed for pain.       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[place]) 07/17/2021   Past Surgical History:  Procedure Laterality Date   APPENDECTOMY     CESAREAN SECTION     OTHER SURGICAL HISTORY     c-section   Family History  Problem Relation Age of Onset   Stroke Neg Hx        none that she knows of    Seizures Neg Hx    Social History   Socioeconomic History   Marital status: Married    Spouse name: Not on file   Number of children: 2   Years of education: Not on file   Highest education level: Not on file  Occupational History   Not on file  Tobacco Use   Smoking status: Some Days    Packs/day: 0    Types: Cigars, Cigarettes  Smokeless tobacco: Never  Vaping Use   Vaping Use: Former  Substance and Sexual Activity   Alcohol use: Yes    Comment: occasionally   Drug use: No   Sexual activity: Yes    Birth control/protection: None  Other Topics Concern   Not on file  Social History Narrative   Lives at home with husband and son   Right handed   Social Determinants of Health   Financial Resource Strain: Not on file  Food Insecurity: No Food Insecurity (07/04/2022)   Hunger Vital Sign    Worried About Running Out of Food in the Last Year: Never true    Ran Out of Food in the Last Year: Never true  Transportation Needs: No Transportation Needs (07/04/2022)   PRAPARE - Hydrologist (Medical): No    Lack of Transportation (Non-Medical): No  Physical Activity:  Not on file  Stress: Not on file  Social Connections: Not on file  Intimate Partner Violence: Not At Risk (07/04/2022)   Humiliation, Afraid, Rape, and Kick questionnaire    Fear of Current or Ex-Partner: No    Emotionally Abused: No    Physically Abused: No    Sexually Abused: No  Review of Systems  Constitutional: Negative.   HENT: Negative.    Eyes: Negative.   Respiratory: Negative.    Cardiovascular: Negative.   Gastrointestinal: Negative.   Genitourinary: Negative.   Musculoskeletal:  Positive for back pain and joint pain.  Skin: Negative.   Neurological: Negative.   Endo/Heme/Allergies: Negative.   Psychiatric/Behavioral: Negative.      Physical Exam: Last menstrual period 07/09/2021. Physical Exam Constitutional:      Appearance: Normal appearance.  Eyes:     Pupils: Pupils are equal, round, and reactive to light.  Cardiovascular:     Rate and Rhythm: Normal rate and regular rhythm.     Pulses: Normal pulses.  Pulmonary:     Effort: Pulmonary effort is normal.  Abdominal:     General: Abdomen is flat. Bowel sounds are normal.  Skin:    General: Skin is warm.  Neurological:     General: No focal deficit present.     Mental Status: She is alert. Mental status is at baseline.  Psychiatric:        Mood and Affect: Mood normal.        Thought Content: Thought content normal.        Judgment: Judgment normal.      Lab results: No results found for this or any previous visit (from the past 24 hour(s)).  Imaging results:  No results found.   Assessment & Plan: Patient admitted to sickle cell day infusion center for management of pain crisis.  Patient is opiate tolerant  Initiate IV dilaudid PCA.  IV fluids, 0.45% saline at 100 ml/hr Toradol 15 mg IV times one dose Tylenol 1000 mg by mouth times one dose Review CBC with differential, complete metabolic panel, and reticulocytes as results become available.  Pain intensity will be reevaluated in context  of functioning and relationship to baseline as care progresses If pain intensity remains elevated and/or sudden change in hemodynamic stability transition to inpatient services for higher level of care.      [name]  APRN, MSN, FNP-C Patient [facility] [address] [place], [place] [address] [phone]  07/25/2022, 9:29 AM

## 2022-07-25 NOTE — Discharge Summary (Signed)
[facility] Discharge Summary   Patient ID: Lindsey [name] MRN: [id number] DOB/AGE: 51-May-1972 51 y.o.  Admit date: 07/25/2022 Discharge date: 07/25/2022  Primary Care Physician:  Dorena Dew, FNP  Admission Diagnoses:  Active Problems:   Sickle cell pain crisis [facility])   Discharge Medications:  Allergies as of 07/25/2022       Reactions   Ketamine Anxiety, Other (See Comments)   Tachycardia        Medication List     TAKE these medications    carboxymethylcellulose 0.5 % Soln Commonly known as: REFRESH PLUS Place 1 drop into both eyes daily as needed (dry eyes).   folic acid 1 MG tablet Commonly known as: FOLVITE Take 1 tablet (1 mg total) by mouth daily.   gabapentin 400 MG capsule Commonly known as: NEURONTIN Take 1 capsule (400 mg total) by mouth 3 (three) times daily.   hydroxyurea 500 MG capsule Commonly known as: HYDREA Take 1 capsule (500 mg total) by mouth daily. May take with food to minimize GI side effects.   ibuprofen 800 MG tablet Commonly known as: ADVIL Take 1 tablet (800 mg total) by mouth every 8 (eight) hours as needed. What changed: reasons to take this   oxyCODONE 10 mg 12 hr tablet Commonly known as: OXYCONTIN Take 1 tablet (10 mg total) by mouth every 12 (twelve) hours.   Oxycodone HCl 10 MG Tabs Take 1 tablet (10 mg total) by mouth every 6 (six) hours as needed for up to 15 days (for moderate to severe pain).   Tylenol 8 Hour Arthritis Pain 650 MG CR tablet Generic drug: acetaminophen Take 650 mg by mouth every 8 (eight) hours as needed for pain.         Consults:  None  Significant Diagnostic Studies:  DG Lumbar Spine Complete  Result Date: 07/05/2022 CLINICAL DATA:  Sickle cell crisis, lower back pain, bilateral lower extremity radiculopathy EXAM: LUMBAR SPINE - COMPLETE 4+ VIEW COMPARISON:  None Available. FINDINGS: Frontal, bilateral oblique, and lateral views of the lumbar spine are obtained.  There are 5 non-rib-bearing lumbar type vertebral bodies identified, with mild right convex curvature centered at L4-5. Otherwise alignment is anatomic. No acute fractures. There is L4-5 and L5-S1 spondylosis and facet hypertrophy. Remaining disc spaces are well preserved. Sacroiliac joints are normal. IMPRESSION: 1. Prominent spondylosis and facet hypertrophy at L4-5 and L5-S1. 2. No acute bony abnormality. Electronically Signed   By: Randa Ngo M.D.   On: 07/05/2022 19:36    History of present illness:  Lindsey [name] is a 52 year old  female with a medical history significant for sickle cell disease,chronic pain syndrome,opiate dependence and tolerance, and history of anemia of chronic disease presents with complaints of upper and lower extremity pain over the past several days. Pain unrelieved by patient's home medications. She last had oxycodone and oxycontin this am without very much relief. She rates pain as 10/10, constant, and throbbing. Denies fever, chills, chest pain, headache. No nausea, vomiting, or diarrhea. No recent travel or sick contacts.   [facility] Course: Reviewed all laboratory values, consistent with baseline.  Pain managed with IV dilaudid PCA Toradol 15 mg IV x 1 Tylenol 1000 mg x 1 0.45% saline at 100 ml/hr Pain intensity decreased throughout admission and patient is requesting discharge home.  Patient alert, oriented, and ambulating without assistance Patient will be discharged home in a hemodynamically stable condition.   Discharge instructions:  Resume all home medications.  Follow up with PCP as previously  scheduled.   Discussed the importance of drinking 64 ounces of water daily, dehydration of red blood cells may lead further sickling.   Avoid all stressors that precipitate sickle cell pain crisis.     The patient was given clear instructions to go to ER or return to medical center if symptoms do not improve, worsen or new problems  develop.     Physical Exam at Discharge:  BP 111/63 (BP Location: Right Arm)   Pulse 70   Temp 98.4 F (36.9 C) (Temporal)   Resp 15   LMP 07/09/2021 (Approximate)   SpO2 98%   Physical Exam Constitutional:      Appearance: Normal appearance.  Eyes:     Pupils: Pupils are equal, round, and reactive to light.  Cardiovascular:     Rate and Rhythm: Normal rate and regular rhythm.     Pulses: Normal pulses.  Pulmonary:     Effort: Pulmonary effort is normal.  Abdominal:     General: Bowel sounds are normal.  Musculoskeletal:        General: Normal range of motion.  Skin:    General: Skin is warm.  Neurological:     General: No focal deficit present.     Mental Status: She is alert. Mental status is at baseline.  Psychiatric:        Mood and Affect: Mood normal.        Behavior: Behavior normal.        Thought Content: Thought content normal.        Judgment: Judgment normal.     Disposition at Discharge: Discharge disposition: 01-Home or Self Care       Discharge Orders: Discharge Instructions     Discharge patient   Complete by: As directed    Discharge disposition: 01-Home or Self Care   Discharge patient date: 07/25/2022       Condition at Discharge:   Stable  Time spent on Discharge:  Greater than 30 minutes.  Signed: [name]  APRN, MSN, FNP-C Patient [facility] [address] [place], [place] [address] [phone]  07/25/2022, 3:35 PM

## 2022-07-25 NOTE — Telephone Encounter (Signed)
Patient called requesting to come to the day hospital for sickle cell pain. Patient reports left leg pain rated 6/10. Reports taking Oxycodone 10 mg and Ibuprofen around 3 hours ago. COVID-19 screening done and patient denies all symptoms exposures. Admits to some nausea but denies vomiting, diarrhea, fever, chest pain and abdominal pain. Admits to having transportation without driving self. [place], [place] notified. Patient can come to the day hospital for pain management. Patient advised and expresses an understanding.

## 2022-07-26 ENCOUNTER — Other Ambulatory Visit: Payer: Self-pay | Admitting: Family Medicine

## 2022-07-26 ENCOUNTER — Inpatient Hospital Stay: Payer: Self-pay | Admitting: Family Medicine

## 2022-07-26 DIAGNOSIS — D5700 Hb-SS disease with crisis, unspecified: Secondary | ICD-10-CM

## 2022-07-26 DIAGNOSIS — G894 Chronic pain syndrome: Secondary | ICD-10-CM

## 2022-07-26 MED ORDER — OXYCODONE HCL 10 MG PO TABS: 10.0000 mg | ORAL_TABLET | Freq: Four times a day (QID) | ORAL | 0 refills | Status: DC | PRN

## 2022-07-26 MED ORDER — OXYCODONE HCL ER 10 MG PO T12A: 10.0000 mg | EXTENDED_RELEASE_TABLET | Freq: Two times a day (BID) | ORAL | 0 refills | Status: DC

## 2022-07-26 NOTE — Telephone Encounter (Signed)
Reviewed PDMP substance reporting system prior to prescribing opiate medications. No inconsistencies noted.  Meds ordered this encounter  Medications   Oxycodone HCl 10 MG TABS    Sig: Take 1 tablet (10 mg total) by mouth every 6 (six) hours as needed for up to 15 days (for moderate to severe pain).    Dispense:  60 tablet    Refill:  0    Order Specific Question:   Supervising Provider    Answer:   Tresa Garter [id number]   [name]  APRN, MSN, FNP-C Patient [facility] [address] [place], [place] [address] [phone]

## 2022-07-26 NOTE — Telephone Encounter (Signed)
Reviewed PDMP substance reporting system prior to prescribing opiate medications. No inconsistencies noted.  Meds ordered this encounter  Medications   Oxycodone HCl 10 MG TABS    Sig: Take 1 tablet (10 mg total) by mouth every 6 (six) hours as needed for up to 15 days (for moderate to severe pain).    Dispense:  60 tablet    Refill:  0    Order Specific Question:   Supervising Provider    Answer:   Tresa Garter [id number]   oxyCODONE (OXYCONTIN) 10 mg 12 hr tablet    Sig: Take 1 tablet (10 mg total) by mouth every 12 (twelve) hours.    Dispense:  60 tablet    Refill:  0    Order Specific Question:   Supervising Provider    Answer:   Tresa Garter [id number]   [name]  APRN, MSN, FNP-C Patient [facility] [address] [place], [place] [address] [phone]

## 2022-07-26 NOTE — Telephone Encounter (Signed)
From: Tereasa Coop To: Office of [name], [place] Sent: 07/25/2022 5:56 PM EDT Subject: Medication Renewal Request  Refills have been requested for the following medications:   Oxycodone HCl 10 MG TABS [Lucina Betty]  Preferred pharmacy: [facility], [address] AT [place] Delivery method: Arlyss Gandy

## 2022-07-27 ENCOUNTER — Other Ambulatory Visit: Payer: Self-pay

## 2022-07-27 DIAGNOSIS — G894 Chronic pain syndrome: Secondary | ICD-10-CM

## 2022-07-27 DIAGNOSIS — D5700 Hb-SS disease with crisis, unspecified: Secondary | ICD-10-CM

## 2022-07-27 NOTE — Telephone Encounter (Signed)
Please advise KH 

## 2022-07-27 NOTE — Telephone Encounter (Signed)
From: Tereasa Coop To: Office of [name], [place] Sent: 07/24/2022 4:43 PM EDT Subject: Medication Renewal Request  Refills have been requested for the following medications:   Oxycodone HCl 10 MG TABS [Lachina Hollis]  Preferred pharmacy: [facility], [address] AT [place] Delivery method: Arlyss Gandy

## 2022-08-01 ENCOUNTER — Ambulatory Visit: Payer: Self-pay | Admitting: Family Medicine

## 2022-08-02 ENCOUNTER — Ambulatory Visit: Payer: Medicaid Other | Admitting: Family Medicine

## 2022-08-02 ENCOUNTER — Encounter: Payer: Self-pay | Admitting: Family Medicine

## 2022-08-02 VITALS — BP 114/59 | HR 75 | Temp 97.3°F | Wt 141.8 lb

## 2022-08-02 DIAGNOSIS — G894 Chronic pain syndrome: Secondary | ICD-10-CM | POA: Diagnosis not present

## 2022-08-02 DIAGNOSIS — Z23 Encounter for immunization: Secondary | ICD-10-CM | POA: Diagnosis not present

## 2022-08-02 DIAGNOSIS — D571 Sickle-cell disease without crisis: Secondary | ICD-10-CM

## 2022-08-02 DIAGNOSIS — E559 Vitamin D deficiency, unspecified: Secondary | ICD-10-CM | POA: Diagnosis not present

## 2022-08-02 DIAGNOSIS — Z1211 Encounter for screening for malignant neoplasm of colon: Secondary | ICD-10-CM

## 2022-08-02 DIAGNOSIS — Z1231 Encounter for screening mammogram for malignant neoplasm of breast: Secondary | ICD-10-CM

## 2022-08-02 NOTE — Patient Instructions (Signed)
Buprenorphine; Naloxone Dissolving Film What is this medication? BUPRENORPHINE; NALOXONE (byoo pre NOR feen; nal OX one) treats opioid use disorder. Buprenorphine works by reducing withdrawal symptoms and cravings to use opioids. Naloxone supports proper use of this medication. It is most effective when used in combination with counseling and behavior therapy. This medicine may be used for other purposes; ask your health care provider or pharmacist if you have questions. COMMON BRAND NAME(S): BUNAVAIL, Suboxone What should I tell my care team before I take this medication? They need to know if you have any of these conditions: Brain tumor Dental or gum disease Frequently drink alcohol Gallbladder disease Head injury Heart disease Irregular heartbeat or rhythm Liver disease Low adrenal gland function Lung or breathing disease, such as asthma or COPD Mouth sores Pancreatic disease Seizures Sleep apnea Stomach or intestine problems Substance use disorder Taken an MAOI, such as Marplan, Nardil, or Parnate in the last 14 days An unusual or allergic reaction to buprenorphine, naloxone, other medications, foods, dyes, or preservatives Pregnant or trying to get pregnant Breastfeeding How should I use this medication? Take this medication by mouth. Take it as directed on the prescription label at the same time every day. Drink a sip of water to moisten the mouth. Be sure to use the right dose. Place it under the tongue or against the inside of the cheek (follow the instructions for use for the product you are taking). After you put this medication in your mouth as directed, do not move it. If your dose requires you to take more than 2 films, let the first 2 films dissolve, then use the others. Do not cut, chew, or swallow the film. Keep taking it unless your care team tells you to stop. A special MedGuide will be given to you by the pharmacist with each prescription and refill. Be sure to read  this information carefully each time. This medication comes with INSTRUCTIONS FOR USE. Ask your pharmacist for directions on how to use this medicine. Read the information carefully. Talk to your pharmacist or care team if you have questions. Talk to your care team about the use of this medication in children. Special care may be needed. Overdosage: If you think you have taken too much of this medicine contact a poison control center or emergency room at once. NOTE: This medicine is only for you. Do not share this medicine with others. What if I miss a dose? If you miss a dose, take it as soon as you can. If it is almost time for your next dose, take only that dose. Do not take double or extra doses. What may interact with this medication? Do not take this medication with any of the following: Cisapride Dronedarone Pimozide Safinamide Samidorphan Thioridazine This medication may also interact with the following: Alcohol Antihistamines for allergy, cough, and cold Atropine Benzodiazepines, such as alprazolam, diazepam, or lorazepam Certain antibiotics, such as clarithromycin, erythromycin Certain antivirals for hepatitis or HIV Certain medications for bladder problems, such as oxybutynin or tolterodine Certain medications for depression or mental health conditions Certain medications for fungal infections, such as fluconazole, ketoconazole, posaconazole Certain medications for migraine headache like almotriptan, eletriptan, frovatriptan, naratriptan, rizatriptan, sumatriptan, zolmitriptan Certain medications for nausea or vomiting like dolasetron, ondansetron, palonosetron Certain medications for seizures, such as carbamazepine, phenobarbital, phenytoin Certain medications for stomach problems, such as dicyclomine or hyoscyamine Certain medications for travel sickness, such as scopolamine Certain medications for Parkinson disease, such as benztropine or  trihexyphenidyl Ipratropium Linezolid MAOIs,  such as Marplan, Nardil, and Parnate Medications that cause drowsiness before a procedure, such as propofol Medications that help you fall asleep Medications that relax muscles Methylene blue (injected into a vein) Other medications that cause heart rhythm changes Opioid medications for pain or cough Phenothiazines, such as chlorpromazine, prochlorperazine Rifampin This list may not describe all possible interactions. Give your health care provider a list of all the medicines, herbs, non-prescription drugs, or dietary supplements you use. Also tell them if you smoke, drink alcohol, or use illegal drugs. Some items may interact with your medicine. What should I watch for while using this medication? Visit your care team regularly. For this medication to be most effective, you should attend any counseling or support groups that your care team recommends. Do not try to overcome the effects of the medication by taking large amounts of opioids. This can cause severe problems including death. Also, you may be more sensitive to lower doses of opioids after you stop taking this medication. Wear a medical ID bracelet or chain. Carry a card that describes your condition. List the medications and doses you take on the card. Taking this medication with other substances that cause drowsiness, such as alcohol, benzodiazepines, or other opioids can cause serious side effects. Give your care team a list of all medications you use. They will tell you how much medication to take. Do not take more medication than directed. Call emergency services if you have problems breathing or staying awake. Long term use of this medication may cause your brain and body to depend on it. This can happen even when used as directed by your care team. You and your care team will work together to determine how long you will need to take this medication. If your care team wants you to stop  this medication, the dose will be slowly lowered over time to reduce the risk of side effects. Naloxone is an emergency medication used for an opioid overdose. An overdose can happen if you take too much of an opioid. It can also happen if an opioid is taken with some other medications or substances such as alcohol. Know the symptoms of an overdose, such as trouble breathing, unusually tired or sleepy, or not being able to respond or wake up. Make sure to tell caregivers and close contacts where your naloxone is stored. Make sure they know how to use it. After naloxone is given, the person giving it must call emergency services. Naloxone is a temporary treatment. Repeat doses may be needed. This medication may affect your coordination, reaction time, or judgment. Do not drive or operate machinery until you know how this medication affects you. Sit up or stand slowly to reduce the risk of dizzy or fainting spells. Drinking alcohol with this medication can increase the risk of these side effects. This medication will cause constipation. If you do not have a bowel movement for 3 days, call your care team. Your mouth may get dry. Chewing sugarless gum or sucking hard candy and drinking plenty of water may help. Contact your care team if the problem does not go away or is severe. Talk to your care team if you are pregnant or think you might be pregnant. This medication can cause serious birth defects if taken during pregnancy. Prolonged use of this medication during pregnancy can cause withdrawal in a newborn. Talk to your care team before breastfeeding. Changes to your treatment plan may be needed. If you breastfeed while taking this medication, seek medical  care right away if you notice the child has slow or noisy breathing, is unusually sleepy or not able to wake up, or is limp. Long-term use of this medication may cause infertility. Talk to your care team if you are concerned about your fertility. What side  effects may I notice from receiving this medication? Side effects that you should report to your care team as soon as possible: Allergic reactions--skin rash, itching, hives, swelling of the face, lips, tongue, or throat CNS depression--slow or shallow breathing, shortness of breath, feeling faint, dizziness, confusion, trouble staying awake Liver injury--right upper belly pain, loss of appetite, nausea, light-colored stool, dark yellow or brown urine, yellowing skin or eyes, unusual weakness or fatigue Low adrenal gland function--nausea, vomiting, loss of appetite, unusual weakness or fatigue, dizziness Low blood pressure--dizziness, feeling faint or lightheaded, blurry vision Side effects that usually do not require medical attention (report to your care team if they continue or are bothersome): Constipation Dizziness Drowsiness Dry mouth Headache Nausea Vomiting This list may not describe all possible side effects. Call your doctor for medical advice about side effects. You may report side effects to FDA at 1-800-FDA-1088. Where should I keep my medication? Keep out of the reach of children and pets. This medication can be abused. Keep it in a safe place to protect it from theft. Do not share it with anyone. It is only for you. Selling or giving away this medication is dangerous and against the law. Store at room temperature at 25 degrees C (77 degrees F). Keep this medication in the original packaging until you are ready to take it. Get rid of any unused medication after the expiration date. This medication may cause harm and death if it is taken by other adults, children, or pets. It is important to get rid of the medication as soon as you no longer need it, or it is expired. You can do this in two ways: Take the medication to a medication take-back program. Check with your pharmacy or law enforcement to find a location. If you cannot return the medication, remove it from the packaging and  flush it down the toilet. NOTE: This sheet is a summary. It may not cover all possible information. If you have questions about this medicine, talk to your doctor, pharmacist, or health care provider.  2023 Elsevier/Gold Standard (2020-07-08 00:00:00)

## 2022-08-02 NOTE — Progress Notes (Unsigned)
Established Patient Office Visit  Subjective   Patient ID: Lindsey [name], female    DOB: [birth year]  Age: 52 y.o. MRN: [id number]  Chief Complaint  Patient presents with   Sickle Cell Anemia    Lindsey [name] is a very pleasant 52 year old female with a medical history significant for sickle cell disease, chronic pain syndrome, opiate dependence and tolerance, and anemia of chronic disease presents for follow-up of her chronic conditions.  Patient says that she is doing well today and is without complaint.  Patient was recently hospitalized for sickle cell pain crisis several weeks ago.  She says that she has periods of poor control on her current pain medication regimen.  Patient is requesting to start Suboxone.  Patient has started this medication in the past.  Currently, patient's pain intensity is 5/10.  She last had oxycodone and OxyContin this a.m. with moderate relief.  Patient denies any fatigue, urinary symptoms, nausea, vomiting, diarrhea, headache, shortness of breath, or chest pain.    Patient Active Problem List   Diagnosis Date Noted   Sickle cell anemia with crisis ([place]) 05/03/2022   Acute low back pain 03/25/2022   GAD (generalized anxiety disorder) 03/25/2022   Sickle cell pain crisis ([place]) 02/11/2022   Acute sickle cell crisis ([place]) 01/28/2022   Anxiety and depression 12/29/2021   Anemia of chronic disease 10/29/2021   Sickle cell anemia ([place]) 09/10/2019   Sickle cell disease ([place]) 10/01/2017   Chronic pain 10/01/2017   Chronic narcotic use 09/25/2017   Past Medical History:  Diagnosis Date   Acute kidney injury ([place]) 01/18/2021   Acute pain of left shoulder    Cough 06/27/2020   Hypokalemia 06/27/2020   Hypoxia    Leukocytosis 09/03/2016   Opiate abuse, episodic ([facility]) 09/25/2017   Opioid dependence in remission ([facility])    Right leg pain    Sickle cell crisis ([place])    Vasoocclusive sickle cell crisis ([place]) 07/17/2021   Past Surgical History:  Procedure  Laterality Date   APPENDECTOMY     CESAREAN SECTION     OTHER SURGICAL HISTORY     c-section   Social History   Tobacco Use   Smoking status: Some Days    Packs/day: 0    Types: Cigars, Cigarettes   Smokeless tobacco: Never  Vaping Use   Vaping Use: Former  Substance Use Topics   Alcohol use: Yes    Comment: occasionally   Drug use: No   Social History   Socioeconomic History   Marital status: Married    Spouse name: Not on file   Number of children: 2   Years of education: Not on file   Highest education level: Not on file  Occupational History   Not on file  Tobacco Use   Smoking status: Some Days    Packs/day: 0    Types: Cigars, Cigarettes   Smokeless tobacco: Never  Vaping Use   Vaping Use: Former  Substance and Sexual Activity   Alcohol use: Yes    Comment: occasionally   Drug use: No   Sexual activity: Yes    Birth control/protection: None  Other Topics Concern   Not on file  Social History Narrative   Lives at home with husband and son   Right handed   Social Determinants of Health   Financial Resource Strain: Not on file  Food Insecurity: No Food Insecurity (07/04/2022)   Hunger Vital Sign    Worried About Running Out of Food in  the Last Year: Never true    Congress in the Last Year: Never true  Transportation Needs: No Transportation Needs (07/04/2022)   PRAPARE - Hydrologist (Medical): No    Lack of Transportation (Non-Medical): No  Physical Activity: Not on file  Stress: Not on file  Social Connections: Not on file  Intimate Partner Violence: Not At Risk (07/04/2022)   Humiliation, Afraid, Rape, and Kick questionnaire    Fear of Current or Ex-Partner: No    Emotionally Abused: No    Physically Abused: No    Sexually Abused: No   Family Status  Relation Name Status   Mother HTN Alive   Father GSW Deceased   Neg Hx  (Not Specified)   Family History  Problem Relation Age of Onset   Stroke Neg Hx         none that she knows of    Seizures Neg Hx    Allergies  Allergen Reactions   Ketamine Anxiety and Other (See Comments)    Tachycardia      Review of Systems  Constitutional: Negative.   HENT: Negative.    Eyes: Negative.   Respiratory: Negative.    Cardiovascular: Negative.   Gastrointestinal: Negative.   Genitourinary: Negative.   Musculoskeletal:  Positive for back pain and joint pain.  Skin: Negative.   Neurological: Negative.   Psychiatric/Behavioral: Negative.        Objective:     BP (!) 114/59   Pulse 75   Temp (!) 97.3 F (36.3 C)   Wt 141 lb 12.8 oz (64.3 kg)   LMP 07/09/2021 (Approximate)   SpO2 99%   BMI 27.69 kg/m  BP Readings from Last 3 Encounters:  08/02/22 (!) 114/59  07/25/22 111/63  07/08/22 120/86   Wt Readings from Last 3 Encounters:  08/02/22 141 lb 12.8 oz (64.3 kg)  07/04/22 139 lb 15.9 oz (63.5 kg)  06/14/22 144 lb 9.6 oz (65.6 kg)      Physical Exam Constitutional:      Appearance: Normal appearance.  Eyes:     Pupils: Pupils are equal, round, and reactive to light.  Cardiovascular:     Rate and Rhythm: Normal rate and regular rhythm.     Pulses: Normal pulses.  Pulmonary:     Effort: Pulmonary effort is normal.  Abdominal:     General: Bowel sounds are normal.  Musculoskeletal:        General: Normal range of motion.  Skin:    General: Skin is warm.  Neurological:     General: No focal deficit present.     Mental Status: She is alert. Mental status is at baseline.  Psychiatric:        Mood and Affect: Mood normal.        Behavior: Behavior normal.        Thought Content: Thought content normal.        Judgment: Judgment normal.      No results found for any visits on 08/02/22.  Last CBC Lab Results  Component Value Date   WBC 10.2 07/25/2022   HGB 10.4 (L) 07/25/2022   HCT 29.1 (L) 07/25/2022   MCV 85.1 07/25/2022   MCH 30.4 07/25/2022   RDW 15.5 07/25/2022   PLT 487 (H) [id number]   Last  metabolic panel Lab Results  Component Value Date   GLUCOSE 84 07/25/2022   NA 138 07/25/2022   K 5.2 (H) 07/25/2022  CL 105 07/25/2022   CO2 25 07/25/2022   BUN 12 07/25/2022   CREATININE 0.91 07/25/2022   GFRNONAA >60 07/25/2022   CALCIUM 9.0 07/25/2022   PHOS 3.0 05/03/2022   PROT 8.1 07/25/2022   ALBUMIN 4.6 07/25/2022   LABGLOB 2.5 05/20/2021   AGRATIO 1.8 05/20/2021   BILITOT 1.2 07/25/2022   ALKPHOS 37 (L) 07/25/2022   AST 48 (H) 07/25/2022   ALT 27 07/25/2022   ANIONGAP 8 07/25/2022   Last lipids Lab Results  Component Value Date   CHOL  07/25/2007    119        ATP III CLASSIFICATION:  <200     mg/dL   Desirable  200-239  mg/dL   Borderline High  >=240    mg/dL   High   HDL 33 (L) 07/25/2007   LDLCALC  07/25/2007    78        Total Cholesterol/HDL:CHD Risk Coronary Heart Disease Risk Table                     Men   Women  1/2 Average Risk   3.4   3.3   TRIG 38 07/25/2007   CHOLHDL 3.6 07/25/2007   Last hemoglobin A1c No results found for: "HGBA1C" Last thyroid functions Lab Results  Component Value Date   TSH 3.303 05/04/2022   Last vitamin D Lab Results  Component Value Date   VD25OH 25.2 (L) 05/20/2021   Last vitamin B12 and Folate No results found for: "VITAMINB12", "FOLATE"    The ASCVD Risk score (Arnett DK, et al., 2019) failed to calculate for the following reasons:   Cannot find a previous HDL lab   Cannot find a previous total cholesterol lab    Assessment & Plan:   Problem List Items Addressed This Visit       Other   Chronic pain   Relevant Orders   [id number] 11+Oxyco+Alc+Crt-Bund   Other Visit Diagnoses     Hb-SS disease without crisis [facility])    -  Primary   Relevant Orders   [id number] 11+Oxyco+Alc+Crt-Bund   Vitamin D deficiency       Need for Tdap vaccination       Relevant Orders   Tdap vaccine greater than or equal to 51yo IM (Completed)   Encounter for screening mammogram for malignant neoplasm of breast        Relevant Orders   MM 3D SCREENING MAMMOGRAM BILATERAL BREAST   Colon cancer screening       Relevant Orders   Cologuard      1. Hb-SS disease without crisis [facility]) Patient is not on any disease modifying agents at this time.  Recommend restarting hydroxyurea, patient quit in the past because "I did not feel anything".  Patient educated extensively about this medication.  She expressed understanding. [id number] 11+Oxyco+Alc+Crt-Bund  2. Chronic pain syndrome Reviewed PDMP substance reporting system prior to prescribing opiate medications. No inconsistencies noted.   [id number] 11+Oxyco+Alc+Crt-Bund  3. Vitamin D deficiency Advised to continue vitamin D repletion.  Also, vitamin D fortified foods  4. Need for Tdap vaccination  - Tdap vaccine greater than or equal to 51yo IM  5. Encounter for screening mammogram for malignant neoplasm of breast  - MM 3D SCREENING MAMMOGRAM BILATERAL BREAST; Future  6. Colon cancer screening  - Cologuard   Health maintenance: Agree that we would start Suboxone on 08/29/2022.  Provided written materials.      Return in about  3 months (around 11/02/2022) for sickle cell anemia.     [name]  APRN, MSN, FNP-C [facility] [address] [place], [place] [address] [phone]

## 2022-08-04 LAB — DRUG SCREEN 764883 11+OXYCO+ALC+CRT-BUND

## 2022-08-07 LAB — DRUG SCREEN 764883 11+OXYCO+ALC+CRT-BUND
Amphetamines, Urine: NEGATIVE ng/mL
BENZODIAZ UR QL: NEGATIVE ng/mL
Barbiturate: NEGATIVE ng/mL
Cocaine (Metabolite): NEGATIVE ng/mL
Creatinine: 98 mg/dL (ref 20.0–300.0)
Ethanol: NEGATIVE %
Meperidine: NEGATIVE ng/mL
Methadone Screen, Urine: NEGATIVE ng/mL
Phencyclidine: NEGATIVE ng/mL
Propoxyphene: NEGATIVE ng/mL
Tramadol: NEGATIVE ng/mL
pH, Urine: 5.1 (ref 4.5–8.9)

## 2022-08-07 LAB — OXYCODONE/OXYMORPHONE, CONFIRM
OXYCODONE/OXYMORPH: POSITIVE — AB
OXYCODONE: 2594 ng/mL
OXYCODONE: POSITIVE — AB
OXYMORPHONE (GC/MS): >3000 ng/mL
OXYMORPHONE: POSITIVE — AB

## 2022-08-07 LAB — CANNABINOID CONFIRMATION, UR
CANNABINOIDS: POSITIVE — AB
Carboxy THC GC/MS Conf: 79 ng/mL

## 2022-08-07 LAB — OPIATES CONFIRMATION, URINE: Opiates: NEGATIVE ng/mL

## 2022-08-08 ENCOUNTER — Other Ambulatory Visit: Payer: Self-pay

## 2022-08-08 ENCOUNTER — Inpatient Hospital Stay (HOSPITAL_COMMUNITY)
Admission: EM | Admit: 2022-08-08 | Discharge: 2022-08-10 | DRG: 812 | Disposition: A | Discharge status: Home / Self Care | Payer: Medicaid Other | Attending: Internal Medicine | Admitting: Internal Medicine

## 2022-08-08 ENCOUNTER — Other Ambulatory Visit: Payer: Self-pay | Admitting: Family Medicine

## 2022-08-08 ENCOUNTER — Encounter (HOSPITAL_COMMUNITY): Payer: Self-pay | Admitting: Emergency Medicine

## 2022-08-08 DIAGNOSIS — F1121 Opioid dependence, in remission: Secondary | ICD-10-CM | POA: Diagnosis present

## 2022-08-08 DIAGNOSIS — F17210 Nicotine dependence, cigarettes, uncomplicated: Secondary | ICD-10-CM | POA: Diagnosis present

## 2022-08-08 DIAGNOSIS — D5700 Hb-SS disease with crisis, unspecified: Secondary | ICD-10-CM | POA: Diagnosis not present

## 2022-08-08 DIAGNOSIS — Z1152 Encounter for screening for COVID-19: Secondary | ICD-10-CM

## 2022-08-08 DIAGNOSIS — G894 Chronic pain syndrome: Secondary | ICD-10-CM

## 2022-08-08 DIAGNOSIS — M1990 Unspecified osteoarthritis, unspecified site: Secondary | ICD-10-CM | POA: Diagnosis present

## 2022-08-08 DIAGNOSIS — Z79899 Other long term (current) drug therapy: Secondary | ICD-10-CM

## 2022-08-08 DIAGNOSIS — F17290 Nicotine dependence, other tobacco product, uncomplicated: Secondary | ICD-10-CM | POA: Diagnosis present

## 2022-08-08 DIAGNOSIS — G8929 Other chronic pain: Secondary | ICD-10-CM | POA: Diagnosis present

## 2022-08-08 DIAGNOSIS — Z888 Allergy status to other drugs, medicaments and biological substances status: Secondary | ICD-10-CM

## 2022-08-08 DIAGNOSIS — D638 Anemia in other chronic diseases classified elsewhere: Secondary | ICD-10-CM | POA: Diagnosis present

## 2022-08-08 LAB — COMPREHENSIVE METABOLIC PANEL WITH GFR
ALT: 33 U/L (ref 0–44)
AST: 23 U/L (ref 15–41)
Albumin: 4.9 g/dL (ref 3.5–5.0)
Alkaline Phosphatase: 62 U/L (ref 38–126)
Anion gap: 9 (ref 5–15)
BUN: 12 mg/dL (ref 6–20)
CO2: 25 mmol/L (ref 22–32)
Calcium: 9.6 mg/dL (ref 8.9–10.3)
Chloride: 107 mmol/L (ref 98–111)
Creatinine, Ser: 0.79 mg/dL (ref 0.44–1.00)
GFR, Estimated: >60 mL/min
Glucose, Bld: 117 mg/dL — ABNORMAL HIGH (ref 70–99)
Potassium: 3.8 mmol/L (ref 3.5–5.1)
Sodium: 141 mmol/L (ref 135–145)
Total Bilirubin: 1.8 mg/dL — ABNORMAL HIGH (ref 0.3–1.2)
Total Protein: 8.4 g/dL — ABNORMAL HIGH (ref 6.5–8.1)

## 2022-08-08 LAB — RETICULOCYTES
Immature Retic Fract: 32.1 % — ABNORMAL HIGH (ref 2.3–15.9)
RBC.: 3.56 MIL/uL — ABNORMAL LOW (ref 3.87–5.11)
Retic Count, Absolute: 148.5 10*3/uL (ref 19.0–186.0)
Retic Ct Pct: 4.2 % — ABNORMAL HIGH (ref 0.4–3.1)

## 2022-08-08 LAB — SARS CORONAVIRUS 2 BY RT PCR: SARS Coronavirus 2 by RT PCR: NEGATIVE

## 2022-08-08 LAB — CBC WITH DIFFERENTIAL/PLATELET
Abs Immature Granulocytes: 0.05 10*3/uL (ref 0.00–0.07)
Basophils Absolute: 0.1 10*3/uL (ref 0.0–0.1)
Basophils Relative: 1 %
Eosinophils Absolute: 0.5 10*3/uL (ref 0.0–0.5)
Eosinophils Relative: 5 %
HCT: 30.5 % — ABNORMAL LOW (ref 36.0–46.0)
Hemoglobin: 11.1 g/dL — ABNORMAL LOW (ref 12.0–15.0)
Immature Granulocytes: 0 %
Lymphocytes Relative: 38 %
Lymphs Abs: 4.4 10*3/uL — ABNORMAL HIGH (ref 0.7–4.0)
MCH: 30.6 pg (ref 26.0–34.0)
MCHC: 36.4 g/dL — ABNORMAL HIGH (ref 30.0–36.0)
MCV: 84 fL (ref 80.0–100.0)
Monocytes Absolute: 0.7 10*3/uL (ref 0.1–1.0)
Monocytes Relative: 6 %
Neutro Abs: 6 10*3/uL (ref 1.7–7.7)
Neutrophils Relative %: 50 %
Platelets: 437 10*3/uL — ABNORMAL HIGH (ref 150–400)
RBC: 3.63 MIL/uL — ABNORMAL LOW (ref 3.87–5.11)
RDW: 14.9 % (ref 11.5–15.5)
WBC: 11.7 10*3/uL — ABNORMAL HIGH (ref 4.0–10.5)
nRBC: 0.5 % — ABNORMAL HIGH (ref 0.0–0.2)

## 2022-08-08 LAB — TROPONIN I (HIGH SENSITIVITY)
Troponin I (High Sensitivity): 4 ng/L (ref <18)
Troponin I (High Sensitivity): 4 ng/L (ref <18)

## 2022-08-08 MED ORDER — KETOROLAC TROMETHAMINE 30 MG/ML IJ SOLN
15.0000 mg | Freq: Once | INTRAMUSCULAR | Status: AC
  Administered 2022-08-08: 15 mg via INTRAVENOUS
  Filled 2022-08-08: qty 1

## 2022-08-08 MED ORDER — SODIUM CHLORIDE 0.45 % IV SOLN: INTRAVENOUS | Status: AC

## 2022-08-08 MED ORDER — SODIUM CHLORIDE 0.9 % IV SOLN
12.5000 mg | Freq: Once | INTRAVENOUS | Status: AC
  Administered 2022-08-08: 12.5 mg via INTRAVENOUS
  Filled 2022-08-08: qty 12.5

## 2022-08-08 MED ORDER — ENOXAPARIN SODIUM 40 MG/0.4ML IJ SOSY
40.0000 mg | PREFILLED_SYRINGE | INTRAMUSCULAR | Status: DC
  Administered 2022-08-08 – 2022-08-09 (×2): 40 mg via SUBCUTANEOUS
  Filled 2022-08-08 (×2): qty 0.4

## 2022-08-08 MED ORDER — ONDANSETRON HCL 4 MG/2ML IJ SOLN: 4.0000 mg | INTRAMUSCULAR | Status: DC | PRN

## 2022-08-08 MED ORDER — HYDROMORPHONE HCL 2 MG/ML IJ SOLN
2.0000 mg | INTRAMUSCULAR | Status: DC | PRN
  Administered 2022-08-08: 2 mg via INTRAVENOUS
  Filled 2022-08-08: qty 1

## 2022-08-08 MED ORDER — HYDROMORPHONE HCL 2 MG/ML IJ SOLN
2.0000 mg | INTRAMUSCULAR | Status: AC
  Administered 2022-08-08: 2 mg via INTRAVENOUS
  Filled 2022-08-08: qty 1

## 2022-08-08 MED ORDER — DIPHENHYDRAMINE HCL 25 MG PO CAPS
25.0000 mg | ORAL_CAPSULE | ORAL | Status: DC | PRN
  Administered 2022-08-09: 25 mg via ORAL
  Filled 2022-08-08: qty 1

## 2022-08-08 MED ORDER — NALOXONE HCL 0.4 MG/ML IJ SOLN: 0.4000 mg | INTRAMUSCULAR | Status: DC | PRN

## 2022-08-08 MED ORDER — KETOROLAC TROMETHAMINE 15 MG/ML IJ SOLN
15.0000 mg | Freq: Four times a day (QID) | INTRAMUSCULAR | Status: DC
  Administered 2022-08-08 – 2022-08-10 (×7): 15 mg via INTRAVENOUS
  Filled 2022-08-08 (×7): qty 1

## 2022-08-08 MED ORDER — SENNOSIDES-DOCUSATE SODIUM 8.6-50 MG PO TABS
1.0000 | ORAL_TABLET | Freq: Two times a day (BID) | ORAL | Status: DC
  Administered 2022-08-08 – 2022-08-09 (×2): 1 via ORAL
  Filled 2022-08-08 (×3): qty 1

## 2022-08-08 MED ORDER — OXYCODONE HCL 10 MG PO TABS
10.0000 mg | ORAL_TABLET | Freq: Four times a day (QID) | ORAL | 0 refills | Status: DC | PRN
Start: 2022-08-11 — End: 2022-08-10

## 2022-08-08 MED ORDER — HYDROMORPHONE 1 MG/ML IV SOLN
INTRAVENOUS | Status: DC
  Administered 2022-08-08: 30 mg via INTRAVENOUS
  Administered 2022-08-08: 7 mg via INTRAVENOUS
  Administered 2022-08-09 (×2): 5 mg via INTRAVENOUS
  Administered 2022-08-09: 1.5 mg via INTRAVENOUS
  Administered 2022-08-09: 3 mg via INTRAVENOUS
  Administered 2022-08-09: 1 mg via INTRAVENOUS
  Administered 2022-08-09: 2.5 mg via INTRAVENOUS
  Administered 2022-08-10: 2 mg via INTRAVENOUS
  Administered 2022-08-10: 3 mg via INTRAVENOUS
  Filled 2022-08-08 (×2): qty 30

## 2022-08-08 MED ORDER — POLYETHYLENE GLYCOL 3350 17 G PO PACK: 17.0000 g | PACK | Freq: Every day | ORAL | Status: DC | PRN

## 2022-08-08 MED ORDER — OXYCODONE HCL ER 10 MG PO T12A
10.0000 mg | EXTENDED_RELEASE_TABLET | Freq: Two times a day (BID) | ORAL | Status: DC
  Administered 2022-08-08 – 2022-08-09 (×3): 10 mg via ORAL
  Filled 2022-08-08 (×3): qty 1

## 2022-08-08 MED ORDER — ACETAMINOPHEN 325 MG PO TABS: 650.0000 mg | ORAL_TABLET | Freq: Three times a day (TID) | ORAL | Status: DC | PRN

## 2022-08-08 MED ORDER — SODIUM CHLORIDE 0.45 % IV SOLN: INTRAVENOUS | Status: DC

## 2022-08-08 MED ORDER — SODIUM CHLORIDE 0.9% FLUSH: 9.0000 mL | INTRAVENOUS | Status: DC | PRN

## 2022-08-08 MED ORDER — ONDANSETRON HCL 4 MG PO TABS: 4.0000 mg | ORAL_TABLET | ORAL | Status: DC | PRN

## 2022-08-08 NOTE — H&P (Signed)
History and Physical    Lindsey [name] [id number] DOB: 07-12-1970 DOA: 08/08/2022  PCP: Dorena Dew, FNP  Patient coming from: Home  I have personally briefly reviewed patient's old medical records in [place]  Chief Complaint: Sickle cell pain crisis  HPI: Lindsey [name] is a 52 y.o. female with medical history significant for sickle cell anemia, chronic pain syndrome who presented to the ED for evaluation of sickle cell pain crisis.  Patient states that she has been having significant right shoulder pain beginning 3 days ago similar to her usual sickle cell pain crisis episodes.  She has not had any significant relief from her home OxyContin or oxycodone therefore she came to the ED for further management.  She reports some nausea without vomiting.  She denies chest pain, dyspnea, fevers, chills, diaphoresis.  ED Course  Labs/Imaging on admission: I have personally reviewed following labs and imaging studies.  Initial vitals showed BP 153/104, pulse 106, RR 16, temp 99.7 F, SpO2 95% on room air.  Labs show WBC 11.7, hemoglobin 11.1, platelets 437,000, sodium 141, potassium 3.8, bicarb 25, BUN 12, creatinine 0.79, serum glucose 117, troponin 4.  SARS-CoV-2 PCR negative.  Patient was given IV Dilaudid 2 mg x 3, IV Toradol 15 mg.  The hospitalist service was consulted to admit for further evaluation and management.  Review of Systems: All systems reviewed and are negative except as documented in history of present illness above.   Past Medical History:  Diagnosis Date   Acute kidney injury 01/18/2021   Acute pain of left shoulder    Cough 06/27/2020   Hypokalemia 06/27/2020   Hypoxia    Leukocytosis 09/03/2016   Opiate abuse, episodic 09/25/2017   Opioid dependence in remission    Right leg pain    Sickle cell crisis    Vasoocclusive sickle cell crisis 07/17/2021    Past Surgical History:  Procedure Laterality Date   APPENDECTOMY     CESAREAN  SECTION     OTHER SURGICAL HISTORY     c-section    Social History:  reports that she has been smoking cigars and cigarettes. She has never used smokeless tobacco. She reports current alcohol use. She reports that she does not use drugs.  Allergies  Allergen Reactions   Ketamine Anxiety and Other (See Comments)    Tachycardia    Family History  Problem Relation Age of Onset   Stroke Neg Hx        none that she knows of    Seizures Neg Hx      Prior to Admission medications   Medication Sig Start Date End Date Taking? Authorizing Provider  carboxymethylcellulose (REFRESH PLUS) 0.5 % SOLN Place 1 drop into both eyes daily as needed (dry eyes). Patient not taking: Reported on 08/02/2022    [provider]  folic acid (FOLVITE) 1 MG tablet Take 1 tablet (1 mg total) by mouth daily. 12/31/21 12/31/22  Elwyn Reach, MD  gabapentin (NEURONTIN) 400 MG capsule Take 1 capsule (400 mg total) by mouth 3 (three) times daily. 04/07/22   Dorena Dew, FNP  hydroxyurea (HYDREA) 500 MG capsule Take 1 capsule (500 mg total) by mouth daily. May take with food to minimize GI side effects. Patient not taking: Reported on 07/11/2022 06/14/22   Dorena Dew, FNP  ibuprofen (ADVIL) 800 MG tablet Take 1 tablet (800 mg total) by mouth every 8 (eight) hours as needed. Patient taking differently: Take 800 mg by  mouth every 8 (eight) hours as needed for moderate pain. 06/14/22   Dorena Dew, FNP  oxyCODONE (OXYCONTIN) 10 mg 12 hr tablet Take 1 tablet (10 mg total) by mouth every 12 (twelve) hours. 07/28/22   Dorena Dew, FNP  Oxycodone HCl 10 MG TABS Take 1 tablet (10 mg total) by mouth every 6 (six) hours as needed for up to 15 days (for moderate to severe pain). 08/11/22 08/26/22  Dorena Dew, FNP  TYLENOL 8 HOUR ARTHRITIS PAIN 650 MG CR tablet Take 650 mg by mouth every 8 (eight) hours as needed for pain.    [provider]    Physical Exam: Vitals:   08/08/22 1338  08/08/22 1700  BP: (!) 153/104 (!) 104/56  Pulse: (!) 106 62  Resp: 16 16  Temp: 99.7 F (37.6 C) 99 F (37.2 C)  TempSrc: Oral   SpO2: 95% 97%   Constitutional: Crying in pain Eyes: EOMI, lids and conjunctivae normal ENMT: Mucous membranes are moist. Posterior pharynx clear of any exudate or lesions.Normal dentition.  Neck: normal, supple, no masses. Respiratory: clear to auscultation bilaterally, no wheezing, no crackles. Normal respiratory effort. No accessory muscle use.  Cardiovascular: Tachycardic, no murmurs / rubs / gallops. No extremity edema. 2+ pedal pulses. Abdomen: no tenderness, no masses palpated. No hepatosplenomegaly. Bowel sounds positive.  Musculoskeletal: no clubbing / cyanosis. No joint deformity upper and lower extremities. Good ROM, no contractures. Normal muscle tone.  Skin: no rashes, lesions, ulcers. No induration Neurologic:  Sensation intact. Strength 5/5 in all 4.  Psychiatric:  Alert and oriented x 3.  Tearful.  EKG: Personally reviewed. Sinus tachycardia, rate 107.  Rate is faster when compared to prior.  Assessment/Plan Principal Problem:   Sickle cell pain crisis   Lindsey [name] is a 52 y.o. female with medical history significant for sickle cell anemia, chronic pain syndrome who is admitted with sickle cell pain crisis.  Assessment and Plan: * Sickle cell pain crisis Presenting with right shoulder pain typical for sickle cell pain crisis.  Still with uncontrolled pain despite initial treatment in the ED. -Start IV Dilaudid PCA pump -IV Toradol 15 mg every 6 hours -IV fluids with 0.45 normal saline -Continue home OxyContin 10 mg q12h -Hemoglobin stable 11.1  DVT prophylaxis: enoxaparin (LOVENOX) injection 40 mg Start: 08/08/22 1915 Code Status: Full code, confirmed with patient on admission Family Communication: Discussed with patient, she has discussed with family Disposition Plan: From home and likely discharge to home pending  clinical progress Consults called: None Severity of Illness: The appropriate patient status for this patient is OBSERVATION. Observation status is judged to be reasonable and necessary in order to provide the required intensity of service to ensure the patient's safety. The patient's presenting symptoms, physical exam findings, and initial radiographic and laboratory data in the context of their medical condition is felt to place them at decreased risk for further clinical deterioration. Furthermore, it is anticipated that the patient will be medically stable for discharge from the hospital within 2 midnights of admission.   Zada Finders MD [facility]  If 7PM-7AM, please contact night-coverage [email or web address]  08/08/2022, 7:19 PM

## 2022-08-08 NOTE — Hospital Course (Signed)
Lindsey [name] is a 53 y.o. female with medical history significant for sickle cell anemia, chronic pain syndrome who is admitted with sickle cell pain crisis.

## 2022-08-08 NOTE — Telephone Encounter (Signed)
From: Tereasa Coop To: Office of [name], [place] Sent: 08/07/2022 11:47 PM EDT Subject: Medication Renewal Request  Refills have been requested for the following medications:   Oxycodone HCl 10 MG TABS [Lachina Hollis]  Preferred pharmacy: [facility], [address] [facility] [place] Delivery method: Arlyss Gandy

## 2022-08-08 NOTE — ED Provider Notes (Signed)
[place] EMERGENCY DEPARTMENT AT [facility] Provider Note   CSN: [id number] Arrival date & time: 08/08/22  1328     History  Chief Complaint  Patient presents with   Sickle Cell Pain Crisis    Lindsey [name] is a 52 y.o. female.  52 year old female with history of sickle disease presents with atraumatic pain to her right shoulder.  Denies any fever or chills.  States this is similar to her pain crisis in the past.  Unresponsive to her home opiates.  Denies any new shortness of breath.  No chest pain.  No abdominal discomfort.       Home Medications Prior to Admission medications   Medication Sig Start Date End Date Taking? Authorizing Provider  carboxymethylcellulose (REFRESH PLUS) 0.5 % SOLN Place 1 drop into both eyes daily as needed (dry eyes). Patient not taking: Reported on 08/02/2022    [provider]  folic acid (FOLVITE) 1 MG tablet Take 1 tablet (1 mg total) by mouth daily. 12/31/21 12/31/22  Elwyn Reach, MD  gabapentin (NEURONTIN) 400 MG capsule Take 1 capsule (400 mg total) by mouth 3 (three) times daily. 04/07/22   Dorena Dew, FNP  hydroxyurea (HYDREA) 500 MG capsule Take 1 capsule (500 mg total) by mouth daily. May take with food to minimize GI side effects. Patient not taking: Reported on 07/11/2022 06/14/22   Dorena Dew, FNP  ibuprofen (ADVIL) 800 MG tablet Take 1 tablet (800 mg total) by mouth every 8 (eight) hours as needed. Patient taking differently: Take 800 mg by mouth every 8 (eight) hours as needed for moderate pain. 06/14/22   Dorena Dew, FNP  oxyCODONE (OXYCONTIN) 10 mg 12 hr tablet Take 1 tablet (10 mg total) by mouth every 12 (twelve) hours. 07/28/22   Dorena Dew, FNP  Oxycodone HCl 10 MG TABS Take 1 tablet (10 mg total) by mouth every 6 (six) hours as needed for up to 15 days (for moderate to severe pain). 08/11/22 08/26/22  Dorena Dew, FNP  TYLENOL 8 HOUR ARTHRITIS PAIN 650 MG CR tablet Take 650  mg by mouth every 8 (eight) hours as needed for pain.    [provider]      Allergies    Ketamine    Review of Systems   Review of Systems  All other systems reviewed and are negative.   Physical Exam Updated Vital Signs BP (!) 153/104 (BP Location: Left Arm)   Pulse (!) 106   Temp 99.7 F (37.6 C) (Oral)   Resp 16   LMP 07/09/2021 (Approximate)   SpO2 95%  Physical Exam Vitals and nursing note reviewed.  Constitutional:      General: She is not in acute distress.    Appearance: Normal appearance. She is well-developed. She is not toxic-appearing.  HENT:     Head: Normocephalic and atraumatic.  Eyes:     General: Lids are normal.     Conjunctiva/sclera: Conjunctivae normal.     Pupils: Pupils are equal, round, and reactive to light.  Neck:     Thyroid: No thyroid mass.     Trachea: No tracheal deviation.  Cardiovascular:     Rate and Rhythm: Normal rate and regular rhythm.     Heart sounds: Normal heart sounds. No murmur heard.    No gallop.  Pulmonary:     Effort: Pulmonary effort is normal. No respiratory distress.     Breath sounds: Normal breath sounds. No stridor. No  decreased breath sounds, wheezing, rhonchi or rales.  Abdominal:     General: There is no distension.     Palpations: Abdomen is soft.     Tenderness: There is no abdominal tenderness. There is no rebound.  Musculoskeletal:        General: Normal range of motion.     Right shoulder: Tenderness present. No bony tenderness. Normal range of motion.     Cervical back: Normal range of motion and neck supple.  Skin:    General: Skin is warm and dry.     Findings: No abrasion or rash.  Neurological:     Mental Status: She is alert and oriented to person, place, and time. Mental status is at baseline.     GCS: GCS eye subscore is 4. GCS verbal subscore is 5. GCS motor subscore is 6.     Cranial Nerves: No cranial nerve deficit.     Sensory: No sensory deficit.     Motor: Motor function is  intact.  Psychiatric:        Attention and Perception: Attention normal.        Speech: Speech normal.        Behavior: Behavior normal.     ED Results / Procedures / Treatments   Labs (all labs ordered are listed, but only abnormal results are displayed) Labs Reviewed  CBC WITH DIFFERENTIAL/PLATELET - Abnormal; Notable for the following components:      Result Value   WBC 11.7 (*)    RBC 3.63 (*)    Hemoglobin 11.1 (*)    HCT 30.5 (*)    MCHC 36.4 (*)    Platelets 437 (*)    nRBC 0.5 (*)    Lymphs Abs 4.4 (*)    All other components within normal limits  COMPREHENSIVE METABOLIC PANEL - Abnormal; Notable for the following components:   Glucose, Bld 117 (*)    Total Protein 8.4 (*)    Total Bilirubin 1.8 (*)    All other components within normal limits  RETICULOCYTES - Abnormal; Notable for the following components:   Retic Ct Pct 4.2 (*)    RBC. 3.56 (*)    Immature Retic Fract 32.1 (*)    All other components within normal limits  SARS CORONAVIRUS 2 BY RT PCR  TROPONIN I (HIGH SENSITIVITY)  TROPONIN I (HIGH SENSITIVITY)    EKG EKG Interpretation  Date/Time:  Monday August 08 2022 14:13:07 EDT Ventricular Rate:  107 PR Interval:  159 QRS Duration: 77 QT Interval:  333 QTC Calculation: 445 R Axis:   86 Text Interpretation: Sinus tachycardia Confirmed by Lacretia Leigh ([id number]) on 08/08/2022 3:45:39 PM  Radiology No results found.  Procedures Procedures    Medications Ordered in ED Medications  0.45 % sodium chloride infusion (has no administration in time range)  HYDROmorphone (DILAUDID) injection 2 mg (has no administration in time range)  HYDROmorphone (DILAUDID) injection 2 mg (has no administration in time range)  HYDROmorphone (DILAUDID) injection 2 mg (has no administration in time range)  diphenhydrAMINE (BENADRYL) 12.5 mg in sodium chloride 0.9 % 50 mL IVPB (has no administration in time range)  ondansetron (ZOFRAN) injection 4 mg (has no  administration in time range)  ketorolac (TORADOL) 30 MG/ML injection 15 mg (has no administration in time range)    ED Course/ Medical Decision Making/ A&P  Medical Decision Making Risk Prescription drug management.   Patient is EKG per my interpretation shows no signs of acute coronary ischemia.  Patient medicated with IV hydromorphone x 2.  She still remains an extremis.  No evidence of aplastic crisis at this time.  Low suspicion for acute chest syndrome.  Patient will be admitted for pain management.  CRITICAL CARE Performed by: Leota Jacobsen Total critical care time: 40 minutes Critical care time was exclusive of separately billable procedures and treating other patients. Critical care was necessary to treat or prevent imminent or life-threatening deterioration. Critical care was time spent personally by me on the following activities: development of treatment plan with patient and/or surrogate as well as nursing, discussions with consultants, evaluation of patient's response to treatment, examination of patient, obtaining history from patient or surrogate, ordering and performing treatments and interventions, ordering and review of laboratory studies, ordering and review of radiographic studies, pulse oximetry and re-evaluation of patient's condition.         Final Clinical Impression(s) / ED Diagnoses Final diagnoses:  None    Rx / DC Orders ED Discharge Orders     None         Lacretia Leigh, MD 08/08/22 1806

## 2022-08-08 NOTE — Telephone Encounter (Signed)
Please advise Kh 

## 2022-08-08 NOTE — ED Triage Notes (Signed)
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 States home meds aren't working.

## 2022-08-08 NOTE — ED Provider Triage Note (Signed)
Emergency Medicine Provider Triage Evaluation Note  Lindsey [name] , a 52 y.o. female  was evaluated in triage.  Pt complains of pain to left shoulder.  Feels like previous sickle cell pain crisis, it happens all over parts of her body.  Denies any chest pain or shortness of breath.  She has not been sick recently.  Started 3 days ago..  Review of Systems  Per HPI  Physical Exam  BP (!) 153/104 (BP Location: Left Arm)   Pulse (!) 106   Temp 99.7 F (37.6 C) (Oral)   Resp 16   LMP 07/09/2021 (Approximate)   SpO2 95%  Gen:   Awake, no distress   Resp:  Normal effort  MSK:   Moves extremities without difficulty  Other:  Actively sobbing in room  Medical Decision Making  Medically screening exam initiated at 2:03 PM.  Appropriate orders placed.  Lindsey [name] was informed that the remainder of the evaluation will be completed by another provider, this initial triage assessment does not replace that evaluation, and the importance of remaining in the ED until their evaluation is complete.     Lindsey Raring, PA-C 08/08/22 [phone]

## 2022-08-08 NOTE — Assessment & Plan Note (Addendum)
Presenting with right shoulder pain typical for sickle cell pain crisis.  Still with uncontrolled pain despite initial treatment in the ED. -Start IV Dilaudid PCA pump -IV Toradol 15 mg every 6 hours -IV fluids with 0.45 normal saline -Continue home OxyContin 10 mg q12h -Hemoglobin stable 11.1

## 2022-08-09 DIAGNOSIS — D5700 Hb-SS disease with crisis, unspecified: Secondary | ICD-10-CM | POA: Diagnosis not present

## 2022-08-09 DIAGNOSIS — F17210 Nicotine dependence, cigarettes, uncomplicated: Secondary | ICD-10-CM | POA: Diagnosis present

## 2022-08-09 DIAGNOSIS — G894 Chronic pain syndrome: Secondary | ICD-10-CM | POA: Diagnosis present

## 2022-08-09 DIAGNOSIS — Z888 Allergy status to other drugs, medicaments and biological substances status: Secondary | ICD-10-CM | POA: Diagnosis not present

## 2022-08-09 DIAGNOSIS — Z1152 Encounter for screening for COVID-19: Secondary | ICD-10-CM | POA: Diagnosis not present

## 2022-08-09 DIAGNOSIS — D638 Anemia in other chronic diseases classified elsewhere: Secondary | ICD-10-CM | POA: Diagnosis present

## 2022-08-09 DIAGNOSIS — Z79899 Other long term (current) drug therapy: Secondary | ICD-10-CM | POA: Diagnosis not present

## 2022-08-09 DIAGNOSIS — F17290 Nicotine dependence, other tobacco product, uncomplicated: Secondary | ICD-10-CM | POA: Diagnosis present

## 2022-08-09 DIAGNOSIS — F1121 Opioid dependence, in remission: Secondary | ICD-10-CM | POA: Diagnosis present

## 2022-08-09 DIAGNOSIS — M1990 Unspecified osteoarthritis, unspecified site: Secondary | ICD-10-CM | POA: Diagnosis present

## 2022-08-09 LAB — BASIC METABOLIC PANEL
Anion gap: 8 (ref 5–15)
BUN: 15 mg/dL (ref 6–20)
CO2: 24 mmol/L (ref 22–32)
Calcium: 8.7 mg/dL — ABNORMAL LOW (ref 8.9–10.3)
Chloride: 107 mmol/L (ref 98–111)
Creatinine, Ser: 0.84 mg/dL (ref 0.44–1.00)
GFR, Estimated: >60 mL/min (ref >60)
Glucose, Bld: 86 mg/dL (ref 70–99)
Potassium: 4.1 mmol/L (ref 3.5–5.1)
Sodium: 139 mmol/L (ref 135–145)

## 2022-08-09 LAB — CBC
HCT: 23.6 % — ABNORMAL LOW (ref 36.0–46.0)
Hemoglobin: 8.5 g/dL — ABNORMAL LOW (ref 12.0–15.0)
MCH: 30.6 pg (ref 26.0–34.0)
MCHC: 36 g/dL (ref 30.0–36.0)
MCV: 84.9 fL (ref 80.0–100.0)
Platelets: 344 10*3/uL (ref 150–400)
RBC: 2.78 MIL/uL — ABNORMAL LOW (ref 3.87–5.11)
RDW: 14.9 % (ref 11.5–15.5)
WBC: 9.2 10*3/uL (ref 4.0–10.5)
nRBC: 0.5 % — ABNORMAL HIGH (ref 0.0–0.2)

## 2022-08-09 LAB — MRSA NEXT GEN BY PCR, NASAL: MRSA by PCR Next Gen: DETECTED — AB

## 2022-08-09 MED ORDER — SODIUM CHLORIDE 0.45 % IV SOLN: INTRAVENOUS | Status: AC

## 2022-08-09 NOTE — Progress Notes (Signed)
Subjective: Lindsey [name] is a 52 year old female with a medical history significant for sickle cell disease, chronic pain syndrome, opiate dependence and tolerance, anemia of chronic disease, and frequent hospitalizations was admitted overnight for sickle cell pain crisis. She says that pain intensity has improved some.  Pain is 6/10 primarily to low back and lower extremities.  She denies chest pain, shortness of breath, urinary symptoms, nausea, vomiting, or diarrhea.  Objective:  Vital signs in last 24 hours:  Vitals:   08/09/22 0409 08/09/22 0654 08/09/22 0734 08/09/22 0814  BP:  (!) 113/59  (!) 102/52  Pulse:  75  (!) 49  Resp: 11 16 10 16   Temp:  98.3 F (36.8 C)  98.3 F (36.8 C)  TempSrc:  Oral  Oral  SpO2: 97% 100% 96% 98%    Intake/Output from previous day:   Intake/Output Summary (Last 24 hours) at 08/09/2022 1039 Last data filed at 08/09/2022 0551 Gross per 24 hour  Intake 1756.48 ml  Output --  Net 1756.48 ml    Physical Exam: General: Alert, awake, oriented x3, in no acute distress.  HEENT: [place]/AT PEERL, EOMI Neck: Trachea midline,  no masses, no thyromegal,y no JVD, no carotid bruit OROPHARYNX:  Moist, No exudate/ erythema/lesions.  Heart: Regular rate and rhythm, without murmurs, rubs, gallops, PMI non-displaced, no heaves or thrills on palpation.  Lungs: Clear to auscultation, no wheezing or rhonchi noted. No increased vocal fremitus resonant to percussion  Abdomen: Soft, nontender, nondistended, positive bowel sounds, no masses no hepatosplenomegaly noted..  Neuro: No focal neurological deficits noted cranial nerves II through XII grossly intact. DTRs 2+ bilaterally upper and lower extremities. Strength 5 out of 5 in bilateral upper and lower extremities. Musculoskeletal: No warm swelling or erythema around joints, no spinal tenderness noted. Psychiatric: Patient alert and oriented x3, good insight and cognition, good recent to remote recall. Lymph node  survey: No cervical axillary or inguinal lymphadenopathy noted.  Lab Results:  Basic Metabolic Panel:    Component Value Date/Time   NA 139 08/09/2022 0822   NA 142 05/20/2021 1101   K 4.1 08/09/2022 0822   CL 107 08/09/2022 0822   CO2 24 08/09/2022 0822   BUN 15 08/09/2022 0822   BUN 10 05/20/2021 1101   CREATININE 0.84 08/09/2022 0822   GLUCOSE 86 08/09/2022 0822   CALCIUM 8.7 (L) 08/09/2022 0822   CBC:    Component Value Date/Time   WBC 9.2 08/09/2022 0822   HGB 8.5 (L) 08/09/2022 0822   HGB 11.0 (L) 05/20/2021 1101   HCT 23.6 (L) 08/09/2022 0822   HCT 31.3 (L) 05/20/2021 1101   PLT 344 08/09/2022 0822   PLT 509 (H) 05/20/2021 1101   MCV 84.9 08/09/2022 0822   MCV 88 05/20/2021 1101   NEUTROABS 6.0 08/08/2022 1432   NEUTROABS 5.4 05/20/2021 1101   LYMPHSABS 4.4 (H) 08/08/2022 1432   LYMPHSABS 2.5 05/20/2021 1101   MONOABS 0.7 08/08/2022 1432   EOSABS 0.5 08/08/2022 1432   EOSABS 0.3 05/20/2021 1101   BASOSABS 0.1 08/08/2022 1432   BASOSABS 0.1 05/20/2021 1101    Recent Results (from the past 240 hour(s))  SARS Coronavirus 2 by RT PCR (hospital order, performed in [facility] hospital lab) *cepheid single result test* Anterior Nasal Swab     Status: None   Collection Time: 08/08/22  2:26 PM   Specimen: Anterior Nasal Swab  Result Value Ref Range Status   SARS Coronavirus 2 by RT PCR NEGATIVE NEGATIVE Final  Comment: (NOTE) SARS-CoV-2 target nucleic acids are NOT DETECTED.  The SARS-CoV-2 RNA is generally detectable in upper and lower respiratory specimens during the acute phase of infection. The lowest concentration of SARS-CoV-2 viral copies this assay can detect is 250 copies / mL. A negative result does not preclude SARS-CoV-2 infection and should not be used as the sole basis for treatment or other patient management decisions.  A negative result may occur with improper specimen collection / handling, submission of specimen other than nasopharyngeal  swab, presence of viral mutation(s) within the areas targeted by this assay, and inadequate number of viral copies (<250 copies / mL). A negative result must be combined with clinical observations, patient history, and epidemiological information.  Fact Sheet for Patients:   https://www.patel.info/  Fact Sheet for Healthcare Providers: https://hall.com/  This test is not yet approved or  cleared by the [place] FDA and has been authorized for detection and/or diagnosis of SARS-CoV-2 by FDA under an Emergency Use Authorization (EUA).  This EUA will remain in effect (meaning this test can be used) for the duration of the COVID-19 declaration under Section 564(b)(1) of the Act, 21 U.S.C. section 360bbb-3(b)(1), unless the authorization is terminated or revoked sooner.  Performed at [facility], [place] [address] [place], [place] [address]     Studies/Results: No results found.  Medications: Scheduled Meds:  enoxaparin (LOVENOX) injection  40 mg Subcutaneous Q24H   HYDROmorphone   Intravenous Q4H   ketorolac  15 mg Intravenous Q6H   oxyCODONE  10 mg Oral Q12H   senna-docusate  1 tablet Oral BID   Continuous Infusions:  sodium chloride     PRN Meds:.acetaminophen, diphenhydrAMINE, naloxone **AND** sodium chloride flush, ondansetron **OR** ondansetron (ZOFRAN) IV, polyethylene glycol  Consultants: none  Procedures: none  Antibiotics: none  Assessment/Plan: Principal Problem:   Sickle cell pain crisis   Sickle cell disease with pain crisis: Continue IV Dilaudid PCA Toradol 15 mg IV every 6 hours OxyContin 10 mg every 12 hours Will restart oxycodone as pain intensity improves Monitor vital signs very closely, reevaluate pain scale regularly, and supplemental oxygen as needed  Anemia of chronic disease: Hemoglobin stable and consistent with patient's baseline.  There is no clinical indication for  blood transfusion at this time.  Monitor closely.  Chronic pain syndrome: Continue home medications  Code Status: Full Code Family Communication: N/A Disposition Plan: Not yet ready for discharge  [place], MSN, FNP-C [facility] [place] [address] [place], [place] [address] [phone]  If 7PM-7AM, please contact night-coverage.  08/09/2022, 10:39 AM  LOS: 0 days

## 2022-08-09 NOTE — TOC Progression Note (Signed)
Transition of Care [facility]) - Progression Note    Patient Details  Name: Lindsey [name] MRN: [id number] Date of Birth: [birth year]  Transition of Care [facility]) CM/SW Contact  Purcell Mouton, RN Phone Number: 08/09/2022, 11:36 AM  Clinical Narrative:      Transition of Care [facility]) Screening Note   Patient Details  Name: Lindsey [name] Date of Birth: July 26, 1970   Transition of Care [facility]) CM/SW Contact:    Purcell Mouton, RN Phone Number: 08/09/2022, 11:36 AM    Transition of Care Department [facility]) has reviewed patient and no TOC needs have been identified at this time. We will continue to monitor patient advancement through interdisciplinary progression rounds. If new patient transition needs arise, please place a TOC consult.         Expected Discharge Plan and Services                                               Social Determinants of Health (SDOH) Interventions SDOH Screenings   Food Insecurity: No Food Insecurity (08/08/2022)  Housing: Low Risk  (08/08/2022)  Transportation Needs: No Transportation Needs (08/08/2022)  Utilities: Not At Risk (08/08/2022)  Depression (PHQ2-9): Low Risk  (08/02/2022)  Tobacco Use: High Risk (08/08/2022)    Readmission Risk Interventions    07/08/2022    8:42 AM 02/14/2022   10:02 AM 11/30/2021   10:59 AM  Readmission Risk Prevention Plan  Transportation Screening Complete Complete Complete  Medication Review [employer] photographer) Complete Complete Complete  PCP or Specialist appointment within 3-5 days of discharge Complete Complete Complete  HRI or Home Care Consult Complete Complete Complete  SW Recovery Care/Counseling Consult Complete Complete Complete  Palliative Care Screening Not Applicable Not Applicable Not [place] Not Applicable Not Applicable Not Applicable

## 2022-08-10 MED ORDER — OXYCODONE HCL 10 MG PO TABS
10.0000 mg | ORAL_TABLET | Freq: Four times a day (QID) | ORAL | 0 refills | Status: DC | PRN
Start: 2022-08-11 — End: 2022-08-23

## 2022-08-10 NOTE — Plan of Care (Signed)
Patient is alert and oriented X4, PIV removed, discharge instructions send with patient and all questions answered, Pt awaiting husband for pick up, no further needs at this time.  Problem: Education: Goal: Knowledge of vaso-occlusive preventative measures will improve Outcome: Completed/Met Goal: Awareness of infection prevention will improve Outcome: Completed/Met Goal: Awareness of signs and symptoms of anemia will improve Outcome: Completed/Met Goal: Long-term complications will improve Outcome: Completed/Met   Problem: Self-Care: Goal: Ability to incorporate actions that prevent/reduce pain crisis will improve Outcome: Completed/Met   Problem: Bowel/Gastric: Goal: Gut motility will be maintained Outcome: Completed/Met   Problem: Tissue Perfusion: Goal: Complications related to inadequate tissue perfusion will be avoided or minimized Outcome: Completed/Met   Problem: Respiratory: Goal: Pulmonary complications will be avoided or minimized Outcome: Completed/Met Goal: Acute Chest Syndrome will be identified early to prevent complications Outcome: Completed/Met   Problem: Fluid Volume: Goal: Ability to maintain a balanced intake and output will improve Outcome: Completed/Met   Problem: Sensory: Goal: Pain level will decrease with appropriate interventions Outcome: Completed/Met   Problem: Health Behavior: Goal: Postive changes in compliance with treatment and prescription regimens will improve Outcome: Completed/Met   Problem: Education: Goal: Knowledge of General Education information will improve Description: Including pain rating scale, medication(s)/side effects and non-pharmacologic comfort measures Outcome: Completed/Met   Problem: Health Behavior/Discharge Planning: Goal: Ability to manage health-related needs will improve Outcome: Completed/Met   Problem: Clinical Measurements: Goal: Ability to maintain clinical measurements within normal limits will  improve Outcome: Completed/Met Goal: Will remain free from infection Outcome: Completed/Met Goal: Diagnostic test results will improve Outcome: Completed/Met Goal: Respiratory complications will improve Outcome: Completed/Met Goal: Cardiovascular complication will be avoided Outcome: Completed/Met   Problem: Activity: Goal: Risk for activity intolerance will decrease Outcome: Completed/Met   Problem: Nutrition: Goal: Adequate nutrition will be maintained Outcome: Completed/Met   Problem: Coping: Goal: Level of anxiety will decrease Outcome: Completed/Met   Problem: Elimination: Goal: Will not experience complications related to bowel motility Outcome: Completed/Met Goal: Will not experience complications related to urinary retention Outcome: Completed/Met   Problem: Pain Managment: Goal: General experience of comfort will improve Outcome: Completed/Met   Problem: Safety: Goal: Ability to remain free from injury will improve Outcome: Completed/Met   Problem: Skin Integrity: Goal: Risk for impaired skin integrity will decrease Outcome: Completed/Met

## 2022-08-10 NOTE — Discharge Summary (Signed)
Physician Discharge Summary   Patient: Lindsey [name] MRN: [id number] DOB: 17-Feb-1971  Admit date:     08/08/2022  Discharge date: 08/10/2022  Discharge Physician: Lonia Blood   PCP: Massie Maroon, FNP   Recommendations at discharge:   Patient to resume home regimen and follow-up with PCP  Discharge Diagnoses: Principal Problem:   Sickle cell pain crisis Active Problems:   Chronic pain   Anemia of chronic disease  Resolved Problems:   * No resolved hospital problems. *  Hospital Course: Lindsey [name] is a 52 y.o. female with medical history significant for sickle cell anemia, chronic pain syndrome who is admitted with sickle cell pain crisis.  Assessment and Plan: * Sickle cell pain crisis Presenting with right shoulder pain typical for sickle cell pain crisis.  Still with uncontrolled pain despite initial treatment in the ED. -Start IV Dilaudid PCA pump -IV Toradol 15 mg every 6 hours -IV fluids with 0.45 normal saline -Continue home OxyContin 10 mg q12h -Hemoglobin stable 11.1         Consultants: None Procedures performed: Chest x-ray Disposition: Home Diet recommendation:  Discharge Diet Orders (From admission, onward)     Start     Ordered   08/10/22 0000  Diet - low sodium heart healthy        08/10/22 0755           Regular diet DISCHARGE MEDICATION: Allergies as of 08/10/2022       Reactions   Ketamine Anxiety, Other (See Comments)   Tachycardia        Medication List     TAKE these medications    carboxymethylcellulose 0.5 % Soln Commonly known as: REFRESH PLUS Place 1 drop into both eyes daily as needed (dry eyes).   folic acid 1 MG tablet Commonly known as: FOLVITE Take 1 tablet (1 mg total) by mouth daily.   gabapentin 400 MG capsule Commonly known as: NEURONTIN Take 1 capsule (400 mg total) by mouth 3 (three) times daily.   hydroxyurea 500 MG capsule Commonly known as: HYDREA Take 1 capsule (500 mg total) by  mouth daily. May take with food to minimize GI side effects.   ibuprofen 800 MG tablet Commonly known as: ADVIL Take 1 tablet (800 mg total) by mouth every 8 (eight) hours as needed. What changed: reasons to take this   oxyCODONE 10 mg 12 hr tablet Commonly known as: OXYCONTIN Take 1 tablet (10 mg total) by mouth every 12 (twelve) hours.   Oxycodone HCl 10 MG Tabs Take 1 tablet (10 mg total) by mouth every 6 (six) hours as needed for up to 15 days (for moderate to severe pain).   Tylenol 8 Hour Arthritis Pain 650 MG CR tablet Generic drug: acetaminophen Take 650 mg by mouth every 8 (eight) hours as needed for pain.        Discharge Exam: There were no vitals filed for this visit. Constitutional: NAD, calm, comfortable Eyes: PERRL, lids and conjunctivae normal ENMT: Mucous membranes are moist. Posterior pharynx clear of any exudate or lesions.Normal dentition.  Neck: normal, supple, no masses, no thyromegaly Respiratory: clear to auscultation bilaterally, no wheezing, no crackles. Normal respiratory effort. No accessory muscle use.  Cardiovascular: Regular rate and rhythm, no murmurs / rubs / gallops. No extremity edema. 2+ pedal pulses. No carotid bruits.  Abdomen: no tenderness, no masses palpated. No hepatosplenomegaly. Bowel sounds positive.  Musculoskeletal: Good range of motion, no joint swelling or tenderness, Skin: no rashes, lesions, ulcers.  No induration Neurologic: CN 2-12 grossly intact. Sensation intact, DTR normal. Strength 5/5 in all 4.  Psychiatric: Normal judgment and insight. Alert and oriented x 3. Normal mood   Condition at discharge: good  The results of significant diagnostics from this hospitalization (including imaging, microbiology, ancillary and laboratory) are listed below for reference.   Imaging Studies: No results found.  Microbiology: Results for orders placed or performed during the hospital encounter of 08/08/22  SARS Coronavirus 2 by RT  PCR (hospital order, performed in [facility] hospital lab) *cepheid single result test* Anterior Nasal Swab     Status: None   Collection Time: 08/08/22  2:26 PM   Specimen: Anterior Nasal Swab  Result Value Ref Range Status   SARS Coronavirus 2 by RT PCR NEGATIVE NEGATIVE Final    Comment: (NOTE) SARS-CoV-2 target nucleic acids are NOT DETECTED.  The SARS-CoV-2 RNA is generally detectable in upper and lower respiratory specimens during the acute phase of infection. The lowest concentration of SARS-CoV-2 viral copies this assay can detect is 250 copies / mL. A negative result does not preclude SARS-CoV-2 infection and should not be used as the sole basis for treatment or other patient management decisions.  A negative result may occur with improper specimen collection / handling, submission of specimen other than nasopharyngeal swab, presence of viral mutation(s) within the areas targeted by this assay, and inadequate number of viral copies (<250 copies / mL). A negative result must be combined with clinical observations, patient history, and epidemiological information.  Fact Sheet for Patients:   RoadLapTop.co.za  Fact Sheet for Healthcare Providers: http://kim-miller.com/  This test is not yet approved or  cleared by the [place] FDA and has been authorized for detection and/or diagnosis of SARS-CoV-2 by FDA under an Emergency Use Authorization (EUA).  This EUA will remain in effect (meaning this test can be used) for the duration of the COVID-19 declaration under Section 564(b)(1) of the Act, 21 U.S.C. section 360bbb-3(b)(1), unless the authorization is terminated or revoked sooner.  Performed at [facility], [address], [place], [place] [address]   MRSA Next Gen by PCR, Nasal     Status: Abnormal   Collection Time: 08/09/22  7:38 AM   Specimen: Nasal Mucosa; Nasal Swab  Result Value Ref Range Status    MRSA by PCR Next Gen DETECTED (A) NOT DETECTED Final    Comment: (NOTE) The GeneXpert MRSA Assay (FDA approved for NASAL specimens only), is one component of a comprehensive MRSA colonization surveillance program. It is not intended to diagnose MRSA infection nor to guide or monitor treatment for MRSA infections. Test performance is not FDA approved in patients less than 89 years old. Performed at [facility], [address], [place], [place] [address]     Labs: CBC: No results for input(s): "WBC", "NEUTROABS", "HGB", "HCT", "MCV", "PLT" in the last 168 hours.  Basic Metabolic Panel: No results for input(s): "NA", "K", "CL", "CO2", "GLUCOSE", "BUN", "CREATININE", "CALCIUM", "MG", "PHOS" in the last 168 hours.  Liver Function Tests: No results for input(s): "AST", "ALT", "ALKPHOS", "BILITOT", "PROT", "ALBUMIN" in the last 168 hours.  CBG: No results for input(s): "GLUCAP" in the last 168 hours.  Discharge time spent: greater than 30 minutes.  SignedLonia Blood, MD [facility] 08/23/2022

## 2022-08-11 ENCOUNTER — Telehealth: Payer: Self-pay

## 2022-08-11 NOTE — Transitions of Care (Post Inpatient/ED Visit) (Signed)
   08/11/2022  Name: Lindsey [name] MRN: [id number] DOB: Apr 17, 1971  Today's TOC FU Call Status: Today's TOC FU Call Status:: Unsuccessul Call (1st Attempt) Unsuccessful Call (1st Attempt) Date: 08/11/22  Attempted to reach the patient regarding the most recent Inpatient/ED visit.  Follow Up Plan: Additional outreach attempts will be made to reach the patient to complete the Transitions of Care (Post Inpatient/ED visit) call.   Norton Blizzard, [place] ([facility])  Man Program [phone]

## 2022-08-13 IMAGING — CR DG CHEST 2V
2 series · 2 of 2 positions shown · non-contrast
Comparison: 07/23/2019

CLINICAL DATA: History of sickle cell disease with chest pain,
initial encounter

EXAM:
CHEST - 2 VIEW

[w chest lat]
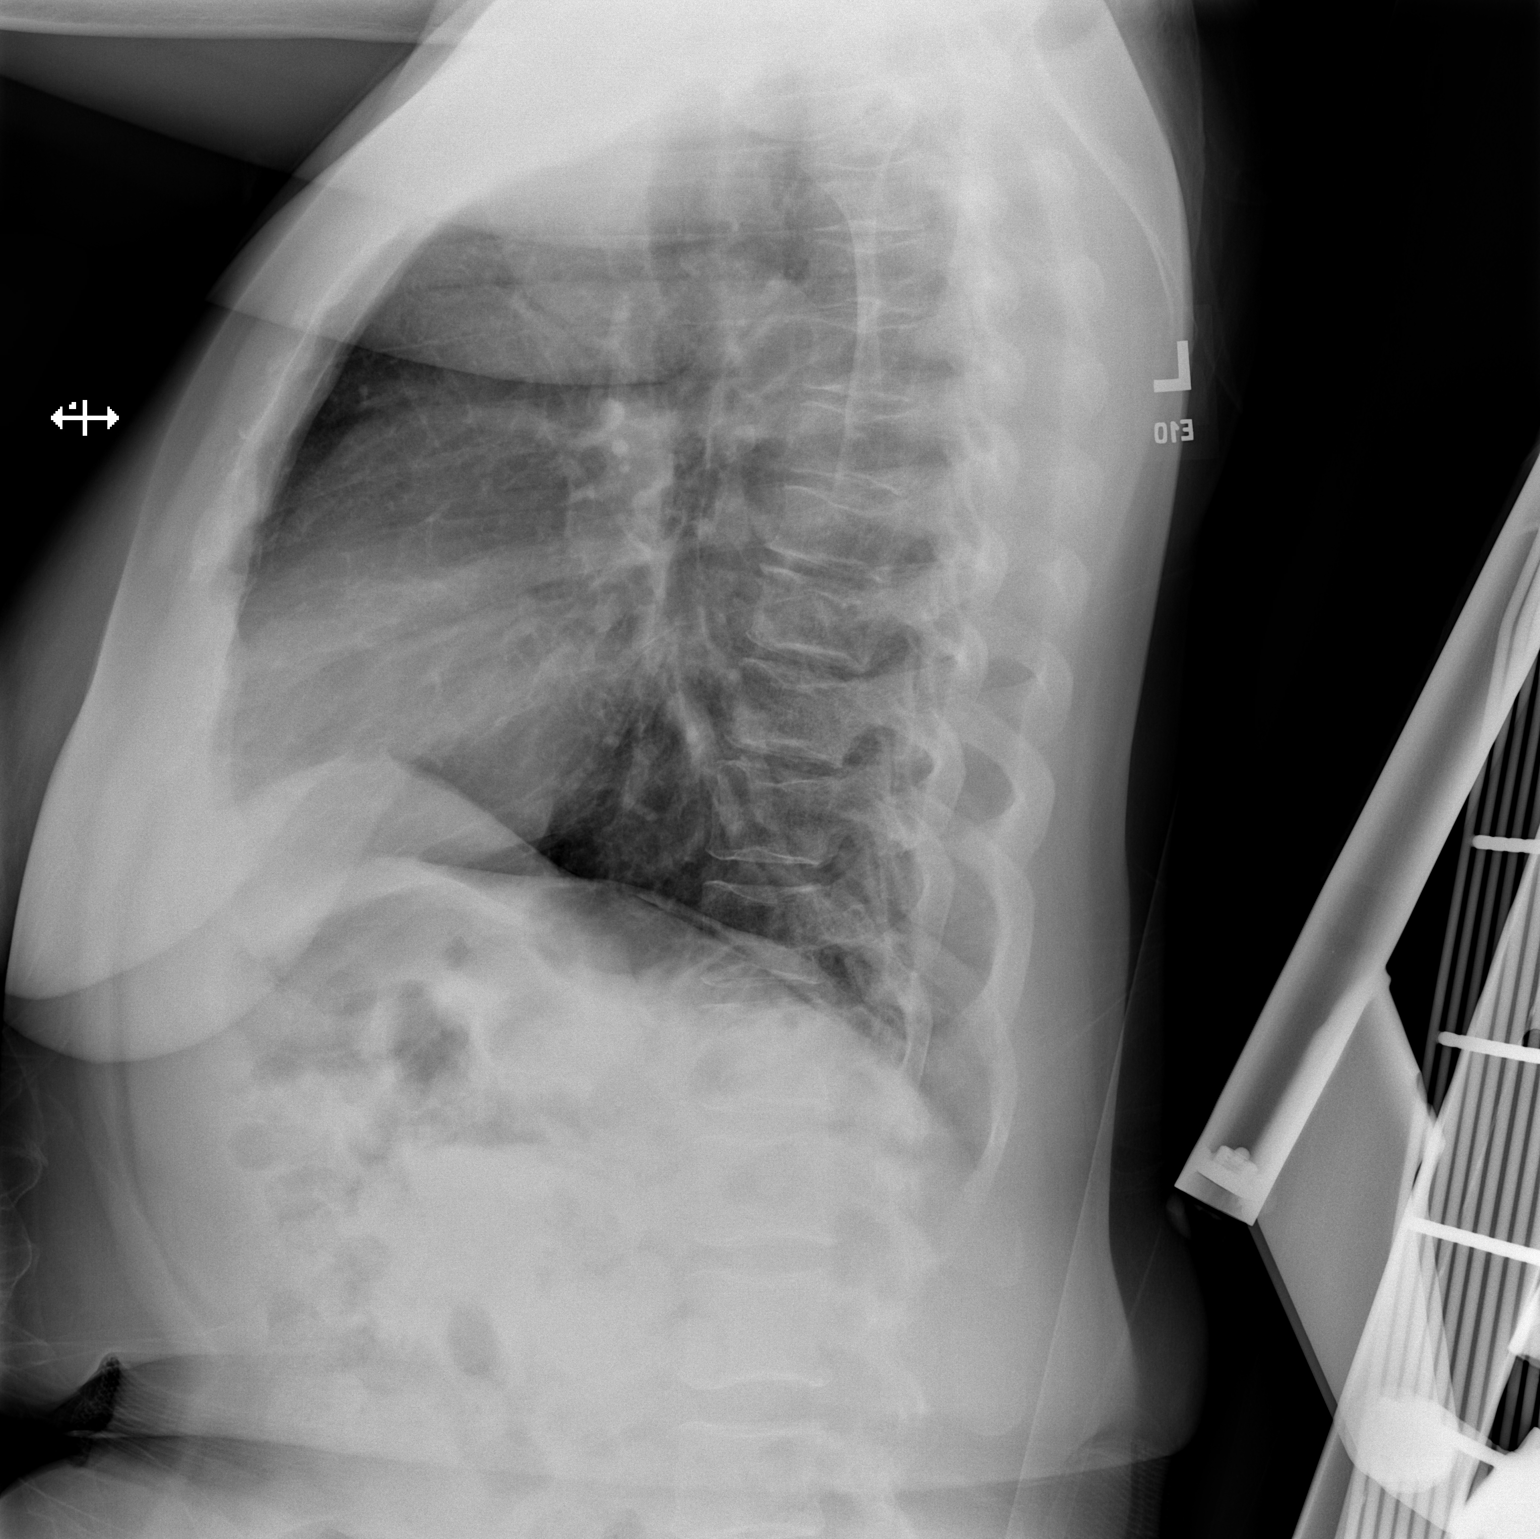

[x chest ap]
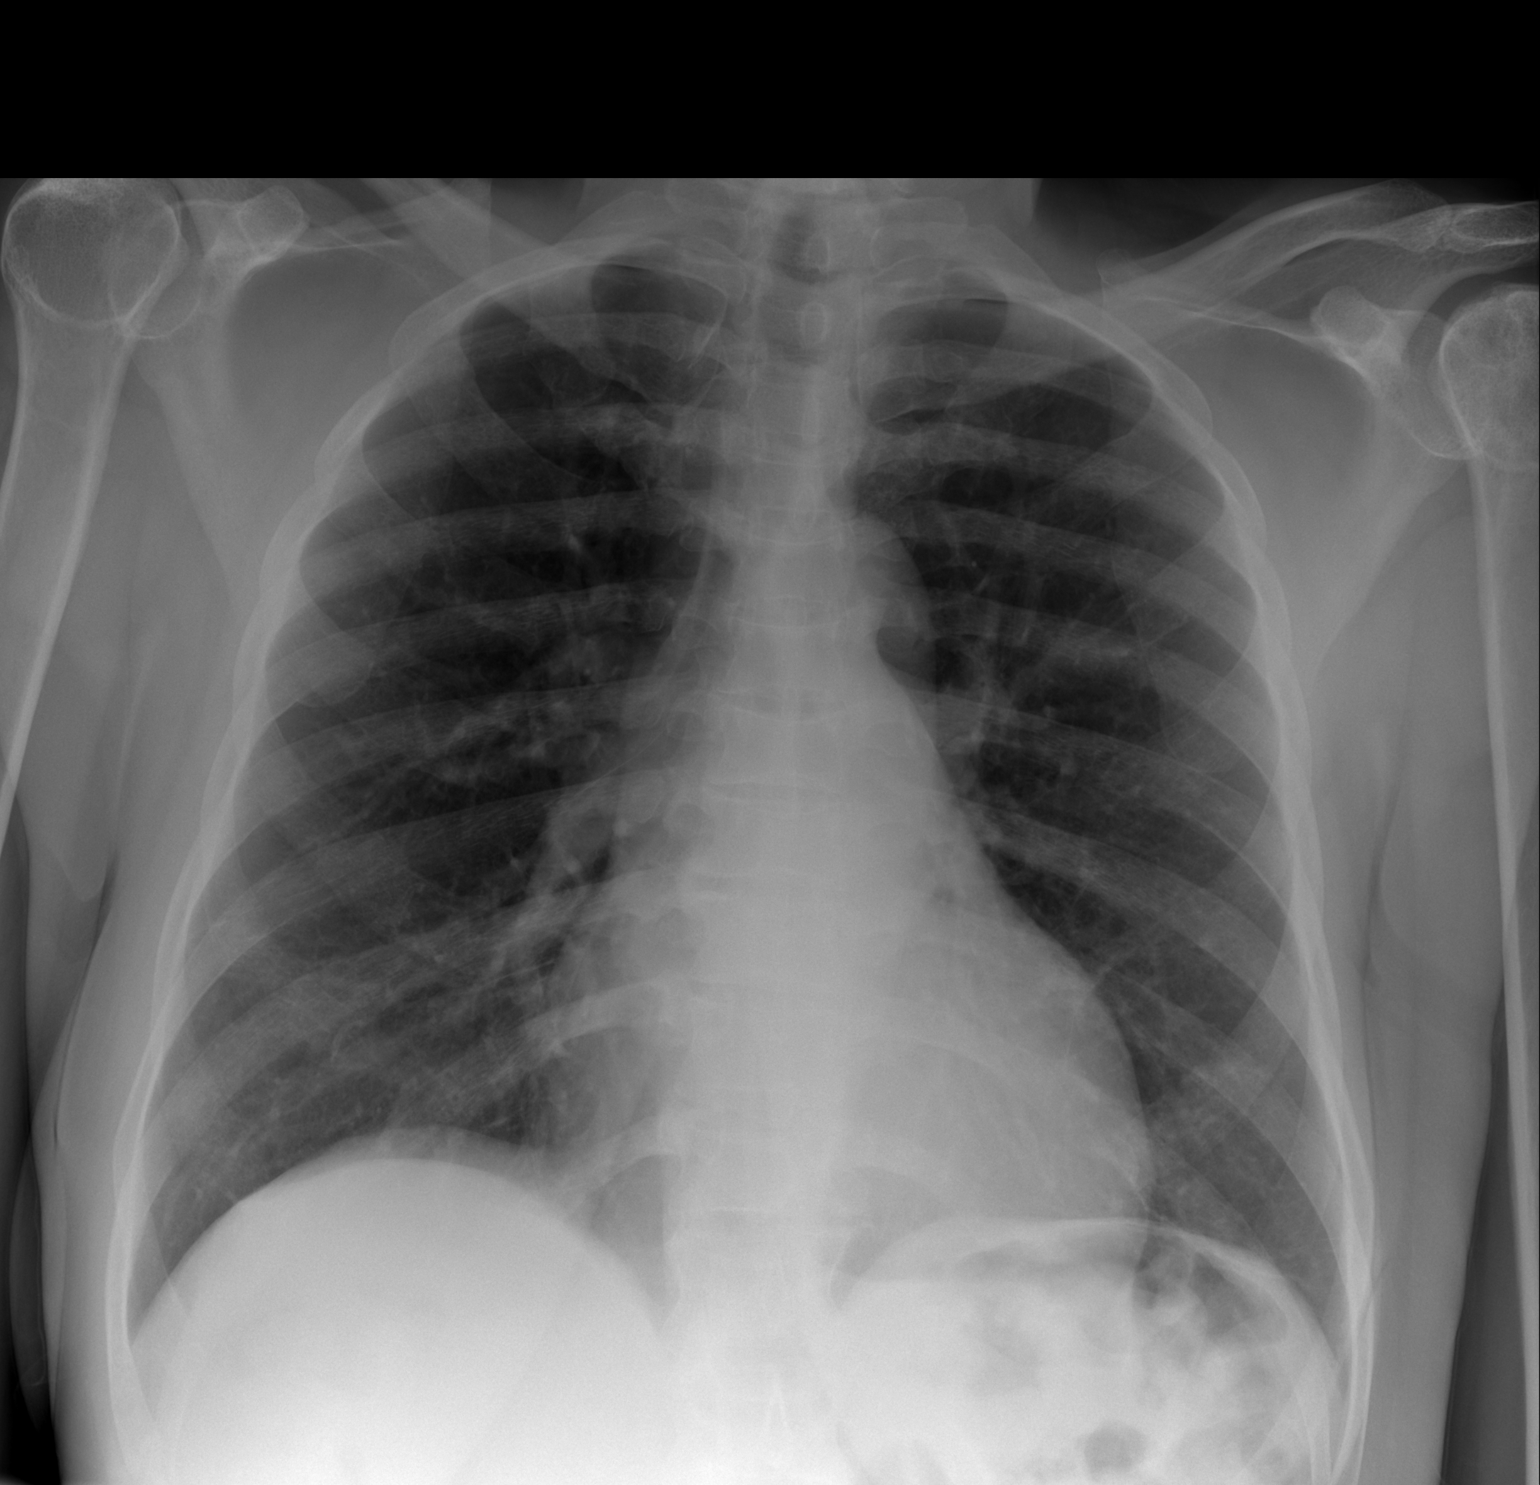

[2 of 2 positions shown; findings below may reference images not displayed]

FINDINGS: Cardiac shadows within normal limits. Are clear bilaterally. Mild
changes of the thoracic spine are noted consistent with the given
clinical history. No acute bony abnormality is seen.
IMPRESSION: No acute abnormality noted.

## 2022-08-23 ENCOUNTER — Other Ambulatory Visit: Payer: Self-pay | Admitting: Family Medicine

## 2022-08-23 DIAGNOSIS — D5700 Hb-SS disease with crisis, unspecified: Secondary | ICD-10-CM

## 2022-08-23 DIAGNOSIS — G894 Chronic pain syndrome: Secondary | ICD-10-CM

## 2022-08-23 MED ORDER — OXYCODONE HCL ER 10 MG PO T12A
10.0000 mg | EXTENDED_RELEASE_TABLET | Freq: Two times a day (BID) | ORAL | 0 refills | Status: DC
Start: 2022-08-29 — End: 2022-09-16

## 2022-08-23 MED ORDER — OXYCODONE HCL 10 MG PO TABS
10.0000 mg | ORAL_TABLET | Freq: Four times a day (QID) | ORAL | 0 refills | Status: DC | PRN
Start: 2022-08-26 — End: 2022-09-06

## 2022-08-23 NOTE — Hospital Course (Signed)
Patient with admitted with sickle cell crisis.  Treated with Dilaudid PCA Toradol and IV fluids.  Also H&H was monitored.  Did not require any transfusion.  Pain came down to 4 out of 10.  Patient was ready for discharge and was discharged home to follow-up with PCP.  At time of discharge prescription was given and patient to follow-up as indicated.

## 2022-08-23 NOTE — Progress Notes (Signed)
Reviewed PDMP substance reporting system prior to prescribing opiate medications. No inconsistencies noted.  Meds ordered this encounter  Medications   oxyCODONE (OXYCONTIN) 10 mg 12 hr tablet    Sig: Take 1 tablet (10 mg total) by mouth every 12 (twelve) hours.    Dispense:  60 tablet    Refill:  0    Order Specific Question:   Supervising Provider    Answer:   [name], [name]   Oxycodone HCl 10 MG TABS    Sig: Take 1 tablet (10 mg total) by mouth every 6 (six) hours as needed for up to 15 days (for moderate to severe pain).    Dispense:  60 tablet    Refill:  0    Order Specific Question:   Supervising Provider    Answer:   [name], [name]   Summar Lindsey Moore Deshia Vanderhoof  APRN, MSN, FNP-C [facility] [address]  [place], [place] [address] [phone]  

## 2022-08-23 NOTE — Telephone Encounter (Signed)
Caller & Relationship to patient:  MRN #  [id number]   Call Back Number:   Date of Last Office Visit: 08/11/2022     Date of Next Office Visit: 09/13/2022    Medication(s) to be Refilled: Oxycodone   Preferred Pharmacy:   ** Please notify patient to allow 48-72 hours to process** **Let patient know to contact pharmacy at the end of the day to make sure medication is ready. ** **If patient has not been seen in a year or longer, book an appointment **Advise to use MyChart for refill requests OR to contact their pharmacy

## 2022-09-06 ENCOUNTER — Other Ambulatory Visit: Payer: Self-pay | Admitting: Family Medicine

## 2022-09-06 ENCOUNTER — Other Ambulatory Visit: Payer: Self-pay

## 2022-09-06 DIAGNOSIS — D5700 Hb-SS disease with crisis, unspecified: Secondary | ICD-10-CM

## 2022-09-06 DIAGNOSIS — G894 Chronic pain syndrome: Secondary | ICD-10-CM

## 2022-09-06 MED ORDER — OXYCODONE HCL 10 MG PO TABS
10.0000 mg | ORAL_TABLET | Freq: Four times a day (QID) | ORAL | 0 refills | Status: DC | PRN
Start: 2022-09-10 — End: 2022-09-16

## 2022-09-06 NOTE — Telephone Encounter (Signed)
From: [name] To: Office of Julianne Handler, [place] Sent: 09/06/2022 11:10 AM EDT Subject: Medication Renewal Request  Refills have been requested for the following medications:   Oxycodone HCl 10 MG TABS [Lachina Hollis]  Preferred pharmacy: [id number] - [place], [place] - [address] AT [facility] OF [address] Delivery method: Daryll Drown

## 2022-09-06 NOTE — Telephone Encounter (Signed)
Please advise KH 

## 2022-09-06 NOTE — Progress Notes (Signed)
Reviewed PDMP substance reporting system prior to prescribing opiate medications. No inconsistencies noted.  Meds ordered this encounter  Medications   Oxycodone HCl 10 MG TABS    Sig: Take 1 tablet (10 mg total) by mouth every 6 (six) hours as needed for up to 15 days (for moderate to severe pain).    Dispense:  60 tablet    Refill:  0    Order Specific Question:   Supervising Provider    Answer:   [name], [name]   Rikayla Demmon Moore Dicky Boer  APRN, MSN, FNP-C [facility] [address]  [place], [place] [address] [phone]  

## 2022-09-13 ENCOUNTER — Ambulatory Visit: Payer: Medicaid Other | Admitting: Family Medicine

## 2022-09-13 VITALS — BP 130/72 | HR 82 | Temp 97.4°F | Wt 146.0 lb

## 2022-09-13 DIAGNOSIS — E559 Vitamin D deficiency, unspecified: Secondary | ICD-10-CM

## 2022-09-13 DIAGNOSIS — G894 Chronic pain syndrome: Secondary | ICD-10-CM | POA: Diagnosis not present

## 2022-09-13 DIAGNOSIS — D5700 Hb-SS disease with crisis, unspecified: Secondary | ICD-10-CM

## 2022-09-13 NOTE — Progress Notes (Signed)
Established Patient Office Visit  Subjective   Patient ID: Lindsey [name], female    DOB: 22-Sep-1970  Age: 52 y.o. MRN: [id number]  Chief Complaint  Patient presents with   Sickle Cell Anemia    Lindsey [name] is a very pleasant 52 year old female with a medical history significant for sickle cell disease, chronic pain syndrome, opiate dependence and tolerance, and history of anemia of chronic disease that presents for medication management. Lindsey [name] has been considering discontinuing opiates and is requesting to start buprenorphine/naloxone therapy.  Patient has started this process in the past without success.  Today, patient's pain is controlled on oxycodone and OxyContin.  Rates pain as 5/10, intermittent, and aching.  Pain is mostly to her low back and lower extremities. Patient denies any illicit drug use.  No urinary symptoms, nausea, vomiting, or diarrhea.    Patient Active Problem List   Diagnosis Date Noted   Sickle cell anemia with crisis (HCC) 05/03/2022   Acute low back pain 03/25/2022   GAD (generalized anxiety disorder) 03/25/2022   Sickle cell pain crisis (HCC) 02/11/2022   Acute sickle cell crisis (HCC) 01/28/2022   Anxiety and depression 12/29/2021   Anemia of chronic disease 10/29/2021   Sickle cell anemia (HCC) 09/10/2019   Sickle cell disease (HCC) 10/01/2017   Chronic pain 10/01/2017   Chronic narcotic use 09/25/2017   Past Medical History:  Diagnosis Date   Acute kidney injury (HCC) 01/18/2021   Acute pain of left shoulder    Cough 06/27/2020   Hypokalemia 06/27/2020   Hypoxia    Leukocytosis 09/03/2016   Opiate abuse, episodic (HCC) 09/25/2017   Opioid dependence in remission (HCC)    Right leg pain    Sickle cell crisis (HCC)    Vasoocclusive sickle cell crisis (HCC) 07/17/2021   Past Surgical History:  Procedure Laterality Date   APPENDECTOMY     CESAREAN SECTION     OTHER SURGICAL HISTORY     c-section   Social History   Tobacco Use    Smoking status: Some Days    Packs/day: 0    Types: Cigars, Cigarettes   Smokeless tobacco: Never  Vaping Use   Vaping Use: Former  Substance Use Topics   Alcohol use: Yes    Comment: occasionally   Drug use: No   Social History   Socioeconomic History   Marital status: Married    Spouse name: Not on file   Number of children: 2   Years of education: Not on file   Highest education level: Not on file  Occupational History   Not on file  Tobacco Use   Smoking status: Some Days    Packs/day: 0    Types: Cigars, Cigarettes   Smokeless tobacco: Never  Vaping Use   Vaping Use: Former  Substance and Sexual Activity   Alcohol use: Yes    Comment: occasionally   Drug use: No   Sexual activity: Yes    Birth control/protection: None  Other Topics Concern   Not on file  Social History Narrative   Lives at home with husband and son   Right handed   Social Determinants of Health   Financial Resource Strain: Not on file  Food Insecurity: No Food Insecurity (08/08/2022)   Hunger Vital Sign    Worried About Running Out of Food in the Last Year: Never true    Ran Out of Food in the Last Year: Never true  Transportation Needs: No Transportation Needs (08/08/2022)  PRAPARE - Administrator, Civil Service (Medical): No    Lack of Transportation (Non-Medical): No  Physical Activity: Not on file  Stress: Not on file  Social Connections: Not on file  Intimate Partner Violence: Not At Risk (08/08/2022)   Humiliation, Afraid, Rape, and Kick questionnaire    Fear of Current or Ex-Partner: No    Emotionally Abused: No    Physically Abused: No    Sexually Abused: No   Family Status  Relation Name Status   Mother HTN Alive   Father GSW Deceased   Neg Hx  (Not Specified)   Family History  Problem Relation Age of Onset   Stroke Neg Hx        none that she knows of    Seizures Neg Hx    Allergies  Allergen Reactions   Ketamine Anxiety and Other (See Comments)     Tachycardia      Review of Systems  Constitutional: Negative.   HENT: Negative.    Eyes: Negative.   Respiratory: Negative.    Cardiovascular: Negative.   Gastrointestinal: Negative.   Genitourinary: Negative.   Musculoskeletal:  Positive for back pain and joint pain.  Skin: Negative.   Neurological: Negative.   Psychiatric/Behavioral: Negative.        Objective:     BP 130/72   Pulse 82   Temp (!) 97.4 F (36.3 C)   Wt 146 lb (66.2 kg)   LMP 07/09/2021 (Approximate)   SpO2 100%   BMI 28.51 kg/m  BP Readings from Last 3 Encounters:  09/13/22 130/72  08/10/22 133/82  08/02/22 (!) 114/59   Wt Readings from Last 3 Encounters:  09/13/22 146 lb (66.2 kg)  08/02/22 141 lb 12.8 oz (64.3 kg)  07/04/22 139 lb 15.9 oz (63.5 kg)      Physical Exam Constitutional:      Appearance: Normal appearance.  Eyes:     Pupils: Pupils are equal, round, and reactive to light.  Cardiovascular:     Rate and Rhythm: Normal rate and regular rhythm.     Pulses: Normal pulses.  Pulmonary:     Effort: Pulmonary effort is normal.  Abdominal:     General: Bowel sounds are normal.  Musculoskeletal:        General: Normal range of motion.  Skin:    General: Skin is warm.  Neurological:     General: No focal deficit present.     Mental Status: She is alert. Mental status is at baseline.  Psychiatric:        Mood and Affect: Mood normal.        Behavior: Behavior normal.        Thought Content: Thought content normal.        Judgment: Judgment normal.      No results found for any visits on 09/13/22.  Last CBC Lab Results  Component Value Date   WBC 9.2 08/09/2022   HGB 8.5 (L) 08/09/2022   HCT 23.6 (L) 08/09/2022   MCV 84.9 08/09/2022   MCH 30.6 08/09/2022   RDW 14.9 08/09/2022   PLT 344 08/09/2022   Last metabolic panel Lab Results  Component Value Date   GLUCOSE 86 08/09/2022   NA 139 08/09/2022   K 4.1 08/09/2022   CL 107 08/09/2022   CO2 24 08/09/2022    BUN 15 08/09/2022   CREATININE 0.84 08/09/2022   GFRNONAA >60 08/09/2022   CALCIUM 8.7 (L) 08/09/2022   PHOS 3.0 05/03/2022  PROT 8.4 (H) 08/08/2022   ALBUMIN 4.9 08/08/2022   LABGLOB 2.5 05/20/2021   AGRATIO 1.8 05/20/2021   BILITOT 1.8 (H) 08/08/2022   ALKPHOS 62 08/08/2022   AST 23 08/08/2022   ALT 33 08/08/2022   ANIONGAP 8 08/09/2022   Last lipids Lab Results  Component Value Date   CHOL  07/25/2007    119        ATP III CLASSIFICATION:  <200     mg/dL   Desirable  161-096  mg/dL   Borderline High  >=045    mg/dL   High   HDL 33 (L) 40/98/1191   LDLCALC  07/25/2007    78        Total Cholesterol/HDL:CHD Risk Coronary Heart Disease Risk Table                     Men   Women  1/2 Average Risk   3.4   3.3   TRIG 38 07/25/2007   CHOLHDL 3.6 07/25/2007   Last hemoglobin A1c No results found for: "HGBA1C" Last thyroid functions Lab Results  Component Value Date   TSH 3.303 05/04/2022   Last vitamin D Lab Results  Component Value Date   VD25OH 25.2 (L) 05/20/2021   Last vitamin B12 and Folate No results found for: "VITAMINB12", "FOLATE"    The ASCVD Risk score (Arnett DK, et al., 2019) failed to calculate for the following reasons:   Cannot find a previous HDL lab   Cannot find a previous total cholesterol lab    Assessment & Plan:   Problem List Items Addressed This Visit       Other   Chronic pain - Primary   Other Visit Diagnoses     Sickle cell anemia with pain (HCC)       Vitamin D deficiency          1. Sickle cell anemia with pain (HCC) Will begin buprenorphine/naloxone on 09/18/2020.  Patient provided written information.  She will discontinue OxyContin on 09/17/2022.  She will discontinue oxycodone on 09/18/2022.  Patient understands that she will start with therapy upon presentation of withdrawal symptoms.  Discussed at length. - Sickle Cell Panel - [id number] 11+Oxyco+Alc+Crt-Bund  2. Chronic pain syndrome Reviewed PDMP substance  reporting system prior to prescribing opiate medications. No inconsistencies noted.   - [id number] 11+Oxyco+Alc+Crt-Bund  3. Vitamin D deficiency  - Sickle Cell Panel  Lindsey [name] will follow-up on 09/19/2022 for buprenorphine/naloxone initiation.    [name]  APRN, MSN, FNP-C Patient Care [facility] [address] [place], [place] [address] [phone]

## 2022-09-15 LAB — CMP14+CBC/D/PLT+FER+RETIC+V...
ALT: 16 IU/L (ref 0–32)
AST: 26 IU/L (ref 0–40)
Albumin/Globulin Ratio: 1.7 (ref 1.2–2.2)
Albumin: 4.7 g/dL (ref 3.9–4.9)
Alkaline Phosphatase: 51 IU/L (ref 44–121)
BUN/Creatinine Ratio: 12 (ref 9–23)
BUN: 10 mg/dL (ref 6–24)
Basophils Absolute: 0 10*3/uL (ref 0.0–0.2)
Basos: 0 %
Bilirubin Total: 1.1 mg/dL (ref 0.0–1.2)
CO2: 22 mmol/L (ref 20–29)
Calcium: 10 mg/dL (ref 8.7–10.2)
Chloride: 107 mmol/L — ABNORMAL HIGH (ref 96–106)
Creatinine, Ser: 0.86 mg/dL (ref 0.57–1.00)
EOS (ABSOLUTE): 0.2 10*3/uL (ref 0.0–0.4)
Eos: 2 %
Ferritin: 266 ng/mL — ABNORMAL HIGH (ref 15–150)
Globulin, Total: 2.8 g/dL (ref 1.5–4.5)
Glucose: 109 mg/dL — ABNORMAL HIGH (ref 70–99)
Hematocrit: 32.6 % — ABNORMAL LOW (ref 34.0–46.6)
Hemoglobin: 10.9 g/dL — ABNORMAL LOW (ref 11.1–15.9)
Immature Grans (Abs): 0 10*3/uL (ref 0.0–0.1)
Immature Granulocytes: 0 %
Lymphocytes Absolute: 3.9 10*3/uL — ABNORMAL HIGH (ref 0.7–3.1)
Lymphs: 41 %
MCH: 30.8 pg (ref 26.6–33.0)
MCHC: 33.4 g/dL (ref 31.5–35.7)
MCV: 92 fL (ref 79–97)
Monocytes Absolute: 0.5 10*3/uL (ref 0.1–0.9)
Monocytes: 5 %
Neutrophils Absolute: 4.7 10*3/uL (ref 1.4–7.0)
Neutrophils: 52 %
Platelets: 453 10*3/uL — ABNORMAL HIGH (ref 150–450)
Potassium: 4.7 mmol/L (ref 3.5–5.2)
RBC: 3.54 x10E6/uL — ABNORMAL LOW (ref 3.77–5.28)
RDW: 16.7 % — ABNORMAL HIGH (ref 11.7–15.4)
Retic Ct Pct: 3 % — ABNORMAL HIGH (ref 0.6–2.6)
Sodium: 144 mmol/L (ref 134–144)
Total Protein: 7.5 g/dL (ref 6.0–8.5)
Vit D, 25-Hydroxy: 19.4 ng/mL — ABNORMAL LOW (ref 30.0–100.0)
WBC: 9.3 10*3/uL (ref 3.4–10.8)
eGFR: 82 mL/min/{1.73_m2} (ref >59)

## 2022-09-16 ENCOUNTER — Ambulatory Visit: Payer: Medicaid Other

## 2022-09-16 ENCOUNTER — Other Ambulatory Visit: Payer: Self-pay | Admitting: Family Medicine

## 2022-09-16 DIAGNOSIS — G894 Chronic pain syndrome: Secondary | ICD-10-CM

## 2022-09-16 DIAGNOSIS — F1120 Opioid dependence, uncomplicated: Secondary | ICD-10-CM

## 2022-09-16 MED ORDER — BUPRENORPHINE HCL-NALOXONE HCL 2-0.5 MG SL SUBL
2.0000 | SUBLINGUAL_TABLET | Freq: Every day | SUBLINGUAL | 0 refills | Status: DC
Start: 2022-09-18 — End: 2022-10-11

## 2022-09-16 NOTE — Progress Notes (Signed)
Lindsey [name] will begin buprenorphine/naloxone induction on 09/19/2022.  Patient will start with 4-1 mg sublingual tablet, which will be 2 tablets under tongue daily.  Patient will report to clinic on Monday.  Advised of how to initiate medication.  Also, sending written instructions through patient's MyChart.   [name]  APRN, MSN, FNP-C Patient Care [facility] [address] [place], [place] [address] [phone]

## 2022-09-18 ENCOUNTER — Telehealth: Payer: Self-pay | Admitting: Family Medicine

## 2022-09-18 NOTE — Telephone Encounter (Signed)
Lindsey [name] is a very pleasant 52 year old female with a medical history significant for sickle cell disease, chronic pain syndrome, opiate dependence and tolerance, and anemia of chronic disease started buprenorphine/naloxone initiation on yesterday when withdrawal symptoms occurred. She discontinued OxyContin 5 days ago and Oxycodone 2 days prior.   She started with a 4 mg dose. Patient denies any pain on today. She will follow up by phone on 09/20/2022.   [name]  APRN, MSN, FNP-C Patient Care [facility] [address] [place], [place] [address] [phone]

## 2022-09-19 LAB — CANNABINOID CONFIRMATION, UR
CANNABINOIDS: POSITIVE — AB
Carboxy THC GC/MS Conf: 42 ng/mL

## 2022-09-19 LAB — DRUG SCREEN 764883 11+OXYCO+ALC+CRT-BUND
Amphetamines, Urine: NEGATIVE ng/mL
BENZODIAZ UR QL: NEGATIVE ng/mL
Barbiturate: NEGATIVE ng/mL
Cocaine (Metabolite): NEGATIVE ng/mL
Creatinine: 71 mg/dL (ref 20.0–300.0)
Ethanol: NEGATIVE %
Meperidine: NEGATIVE ng/mL
Methadone Screen, Urine: NEGATIVE ng/mL
Phencyclidine: NEGATIVE ng/mL
Propoxyphene: NEGATIVE ng/mL
Tramadol: NEGATIVE ng/mL
pH, Urine: 5.4 (ref 4.5–8.9)

## 2022-09-19 LAB — OXYCODONE/OXYMORPHONE, CONFIRM
OXYCODONE/OXYMORPH: POSITIVE — AB
OXYCODONE: >3000 ng/mL
OXYCODONE: POSITIVE — AB
OXYMORPHONE (GC/MS): >3000 ng/mL
OXYMORPHONE: POSITIVE — AB

## 2022-09-19 LAB — OPIATES CONFIRMATION, URINE: Opiates: NEGATIVE ng/mL

## 2022-09-21 ENCOUNTER — Other Ambulatory Visit: Payer: Self-pay | Admitting: Family Medicine

## 2022-09-21 DIAGNOSIS — G894 Chronic pain syndrome: Secondary | ICD-10-CM

## 2022-09-21 DIAGNOSIS — D5700 Hb-SS disease with crisis, unspecified: Secondary | ICD-10-CM

## 2022-09-21 NOTE — Telephone Encounter (Signed)
Please advise KH 

## 2022-10-11 ENCOUNTER — Other Ambulatory Visit: Payer: Self-pay | Admitting: Family Medicine

## 2022-10-11 ENCOUNTER — Other Ambulatory Visit: Payer: Self-pay

## 2022-10-11 DIAGNOSIS — G894 Chronic pain syndrome: Secondary | ICD-10-CM

## 2022-10-11 DIAGNOSIS — F1120 Opioid dependence, uncomplicated: Secondary | ICD-10-CM

## 2022-10-11 MED ORDER — BUPRENORPHINE HCL-NALOXONE HCL 2-0.5 MG SL SUBL
2.0000 | SUBLINGUAL_TABLET | Freq: Every day | SUBLINGUAL | 0 refills | Status: DC
Start: 2022-10-11 — End: 2022-11-11

## 2022-10-11 NOTE — Telephone Encounter (Signed)
Please advise KH 

## 2022-10-11 NOTE — Progress Notes (Signed)
Reviewed PDMP substance reporting system prior to prescribing opiate medications. No inconsistencies noted.  Meds ordered this encounter  Medications   buprenorphine-naloxone (SUBOXONE) 2-0.5 mg SUBL SL tablet    Sig: Place 2 tablets under the tongue daily.    Dispense:  60 tablet    Refill:  0    Order Specific Question:   Supervising Provider    Answer:   Quentin Angst [[id number]]   [name]  APRN, MSN, FNP-C Patient Care [facility] [address] [place], [place] [address] [phone]

## 2022-10-17 ENCOUNTER — Other Ambulatory Visit: Payer: Self-pay

## 2022-10-24 ENCOUNTER — Other Ambulatory Visit: Payer: Self-pay | Admitting: Family Medicine

## 2022-11-01 ENCOUNTER — Ambulatory Visit: Payer: Self-pay | Admitting: Family Medicine

## 2022-11-11 ENCOUNTER — Other Ambulatory Visit: Payer: Self-pay | Admitting: Family Medicine

## 2022-11-11 DIAGNOSIS — F1120 Opioid dependence, uncomplicated: Secondary | ICD-10-CM

## 2022-11-11 DIAGNOSIS — G894 Chronic pain syndrome: Secondary | ICD-10-CM

## 2022-11-11 MED ORDER — BUPRENORPHINE HCL-NALOXONE HCL 2-0.5 MG SL SUBL
2.0000 | SUBLINGUAL_TABLET | Freq: Every day | SUBLINGUAL | 0 refills | Status: DC
Start: 2022-11-15 — End: 2022-12-12

## 2022-11-11 NOTE — Telephone Encounter (Signed)
Reviewed PDMP substance reporting system prior to prescribing opiate medications. No inconsistencies noted.  Meds ordered this encounter  Medications   buprenorphine-naloxone (SUBOXONE) 2-0.5 mg SUBL SL tablet    Sig: Place 2 tablets under the tongue daily.    Dispense:  60 tablet    Refill:  0    Order Specific Question:   Supervising Provider    Answer:   [name], [name]   Lindsey Paolini Moore Bellami Farrelly  APRN, MSN, FNP-C Patient Care [facility] [address]  [place], [place] [address] [phone]  

## 2022-11-11 NOTE — Telephone Encounter (Signed)
From: [name] To: Office of Julianne Handler, [place] Sent: 11/09/2022 9:56 PM EDT Subject: Medication Renewal Request  Refills have been requested for the following medications:   buprenorphine-naloxone (SUBOXONE) 2-0.5 mg SUBL SL tablet [Otha Monical]  Preferred pharmacy: [id number] - [place], [place] - [address] AT [facility] OF [address] Delivery method: Daryll Drown

## 2022-11-25 ENCOUNTER — Ambulatory Visit
Admission: EM | Admit: 2022-11-25 | Discharge: 2022-11-25 | Disposition: A | Discharge status: Home / Self Care | Payer: MEDICAID | Attending: Family Medicine | Admitting: Family Medicine

## 2022-11-25 DIAGNOSIS — M25512 Pain in left shoulder: Secondary | ICD-10-CM | POA: Diagnosis not present

## 2022-11-25 MED ORDER — KETOROLAC TROMETHAMINE 10 MG PO TABS: 10.0000 mg | ORAL_TABLET | Freq: Four times a day (QID) | ORAL | 0 refills | Status: DC | PRN

## 2022-11-25 MED ORDER — KETOROLAC TROMETHAMINE 30 MG/ML IJ SOLN
30.0000 mg | Freq: Once | INTRAMUSCULAR | Status: AC
  Administered 2022-11-25: 30 mg via INTRAMUSCULAR

## 2022-11-25 NOTE — ED Triage Notes (Signed)
Patient with c/o left shoulder and collar bone pain for 4 days. Patient states she has sickle cell.

## 2022-11-25 NOTE — ED Provider Notes (Addendum)
[facility]    CSN: [id number] Arrival date & time: 11/25/22  0820         History   Chief Complaint Chief Complaint  Patient presents with   Shoulder Pain    HPI Lindsey [name] is a 52 y.o. female.    Shoulder Pain Here for a 3 day h/o left shoulder pain, throbbing. No trauma or injury. No fever or cough or URI symptoms. No n/v/d. The pain hurts her in her clavicle area the most.  She has a history of sickle cell. Currently on suboxone, trying to get off chronic pain meds. Also takes gabapentin.  Past Medical History:  Diagnosis Date   Acute kidney injury (HCC) 01/18/2021   Acute pain of left shoulder    Cough 06/27/2020   Hypokalemia 06/27/2020   Hypoxia    Leukocytosis 09/03/2016   Opiate abuse, episodic (HCC) 09/25/2017   Opioid dependence in remission (HCC)    Right leg pain    Sickle cell crisis (HCC)    Vasoocclusive sickle cell crisis (HCC) 07/17/2021    Patient Active Problem List   Diagnosis Date Noted   Sickle cell anemia with crisis (HCC) 05/03/2022   Acute low back pain 03/25/2022   GAD (generalized anxiety disorder) 03/25/2022   Sickle cell pain crisis (HCC) 02/11/2022   Acute sickle cell crisis (HCC) 01/28/2022   Anxiety and depression 12/29/2021   Anemia of chronic disease 10/29/2021   Sickle cell anemia (HCC) 09/10/2019   Sickle cell disease (HCC) 10/01/2017   Chronic pain 10/01/2017   Chronic narcotic use 09/25/2017    Past Surgical History:  Procedure Laterality Date   APPENDECTOMY     CESAREAN SECTION     OTHER SURGICAL HISTORY     c-section    OB History   No obstetric history on file.      Home Medications    Prior to Admission medications   Medication Sig Start Date End Date Taking? Authorizing Provider  buprenorphine-naloxone (SUBOXONE) 2-0.5 mg SUBL SL tablet Place 2 tablets under the tongue daily. 11/15/22   Massie Maroon, FNP  carboxymethylcellulose (REFRESH PLUS) 0.5 % SOLN Place 1 drop into both  eyes daily as needed (dry eyes).    [provider]  folic acid (FOLVITE) 1 MG tablet Take 1 tablet (1 mg total) by mouth daily. 12/31/21 12/31/22  Rometta Emery, MD  gabapentin (NEURONTIN) 400 MG capsule TAKE 1 CAPSULE(400 MG) BY MOUTH THREE TIMES DAILY 09/21/22   Massie Maroon, FNP  hydroxyurea (HYDREA) 500 MG capsule Take 1 capsule (500 mg total) by mouth daily. May take with food to minimize GI side effects. 06/14/22   Massie Maroon, FNP  ketorolac (TORADOL) 10 MG tablet Take 1 tablet (10 mg total) by mouth every 6 (six) hours as needed (pain). 11/25/22  Yes Briseidy Spark, Janace Aris, MD  TYLENOL 8 HOUR ARTHRITIS PAIN 650 MG CR tablet Take 650 mg by mouth every 8 (eight) hours as needed for pain.    [provider]    Family History Family History  Problem Relation Age of Onset   Stroke Neg Hx        none that she knows of    Seizures Neg Hx     Social History Social History   Tobacco Use   Smoking status: Some Days    Types: Cigars, Cigarettes   Smokeless tobacco: Never  Vaping Use   Vaping status: Former  Substance Use Topics   Alcohol  use: Yes    Comment: occasionally   Drug use: No     Allergies   Ketamine   Review of Systems Review of Systems   Physical Exam Triage Vital Signs ED Triage Vitals [11/25/22 0838]  Encounter Vitals Group     BP 139/83     Systolic BP Percentile      Diastolic BP Percentile      Pulse Rate 66     Resp 18     Temp 97.9 F (36.6 C)     Temp Source Oral     SpO2 95 %     Weight      Height      Head Circumference      Peak Flow      Pain Score 6     Pain Loc      Pain Education      Exclude from Growth Chart    No data found.  Updated Vital Signs BP 139/83 (BP Location: Right Arm)   Pulse 66   Temp 97.9 F (36.6 C) (Oral)   Resp 18   LMP 07/09/2021 (Approximate)   SpO2 95%   Visual Acuity Right Eye Distance:   Left Eye Distance:   Bilateral Distance:    Right Eye Near:   Left Eye Near:     Bilateral Near:     Physical Exam Vitals reviewed.  Constitutional:      Appearance: She is not ill-appearing, toxic-appearing or diaphoretic.     Comments: She is tearful in the exam room with pained facies.   HENT:     Mouth/Throat:     Mouth: Mucous membranes are moist.  Eyes:     Extraocular Movements: Extraocular movements intact.     Conjunctiva/sclera: Conjunctivae normal.     Pupils: Pupils are equal, round, and reactive to light.  Cardiovascular:     Rate and Rhythm: Normal rate and regular rhythm.     Heart sounds: No murmur heard. Pulmonary:     Effort: Pulmonary effort is normal. No respiratory distress.     Breath sounds: Normal breath sounds. No stridor. No wheezing, rhonchi or rales.  Musculoskeletal:     Cervical back: Neck supple.     Comments: Left clavicle and anterior shoulder/humerus are tender. No rash/erythema/ecchymosis/deformity   Lymphadenopathy:     Cervical: No cervical adenopathy.  Skin:    Coloration: Skin is not jaundiced or pale.  Neurological:     Mental Status: She is alert and oriented to person, place, and time.  Psychiatric:        Behavior: Behavior normal.      UC Treatments / Results  Labs (all labs ordered are listed, but only abnormal results are displayed) Labs Reviewed - No data to display  EKG   Radiology No results found.  Procedures Procedures (including critical care time)  Medications Ordered in UC Medications  ketorolac (TORADOL) 30 MG/ML injection 30 mg (30 mg Intramuscular Given 11/25/22 0845)    Initial Impression / Assessment and Plan / UC Course  I have reviewed the triage vital signs and the nursing notes.  Pertinent labs & imaging results that were available during my care of the patient were reviewed by me and considered in my medical decision making (see chart for details).        I think this is most likely a pain crisis with her sickle cell. No imaging indicated at this time.   Toradol  injection given here, and toradol tabs  sent to the pharmacy. I have also asked her to push oral fluid intake. If she does not improve, or if she worsens in anyway, with worse pain, or fever, shortness of breath, vomiting, she should proceed to the ER for further eval and tx. Final Clinical Impressions(s) / UC Diagnoses   Final diagnoses:  Acute pain of left shoulder     Discharge Instructions      You have been given a shot of Toradol 30 mg today.  Ketorolac 10 mg tablets--take 1 tablet every 6 hours as needed for pain.  This is the same medicine that is in the shot we just gave you  Drink lots of fluids.  If not improving, or worsening or if new symptoms like fever or vomiting, please go to the ER for further evaluation and treatment     ED Prescriptions     Medication Sig Dispense Auth. Provider   ketorolac (TORADOL) 10 MG tablet Take 1 tablet (10 mg total) by mouth every 6 (six) hours as needed (pain). 20 tablet Faustina Gebert, Janace Aris, MD      I have reviewed the PDMP during this encounter.   Zenia Resides, MD 11/25/22 1610    Zenia Resides, MD 11/25/22 [phone]

## 2022-11-25 NOTE — Discharge Instructions (Addendum)
You have been given a shot of Toradol 30 mg today.  Ketorolac 10 mg tablets--take 1 tablet every 6 hours as needed for pain.  This is the same medicine that is in the shot we just gave you  Drink lots of fluids.  If not improving, or worsening or if new symptoms like fever or vomiting, please go to the ER for further evaluation and treatment

## 2022-12-12 ENCOUNTER — Other Ambulatory Visit: Payer: Self-pay | Admitting: Family Medicine

## 2022-12-12 DIAGNOSIS — F1120 Opioid dependence, uncomplicated: Secondary | ICD-10-CM

## 2022-12-12 DIAGNOSIS — G894 Chronic pain syndrome: Secondary | ICD-10-CM

## 2022-12-12 MED ORDER — BUPRENORPHINE HCL-NALOXONE HCL 2-0.5 MG SL SUBL
2.0000 | SUBLINGUAL_TABLET | Freq: Every day | SUBLINGUAL | 0 refills | Status: DC
Start: 2022-12-15 — End: 2023-01-11

## 2022-12-12 NOTE — Progress Notes (Signed)
Reviewed PDMP substance reporting system prior to prescribing opiate medications. No inconsistencies noted.  Meds ordered this encounter  Medications   buprenorphine-naloxone (SUBOXONE) 2-0.5 mg SUBL SL tablet    Sig: Place 2 tablets under the tongue daily.    Dispense:  60 tablet    Refill:  0    Order Specific Question:   Supervising Provider    Answer:   Quentin Angst [[id number]]   [name]  APRN, MSN, FNP-C Patient Care [facility] [address] [place], [place] [address] [phone]

## 2022-12-20 ENCOUNTER — Ambulatory Visit: Payer: MEDICAID | Admitting: Family Medicine

## 2022-12-20 VITALS — BP 108/76 | HR 73 | Temp 97.2°F | Wt 148.0 lb

## 2022-12-20 DIAGNOSIS — E559 Vitamin D deficiency, unspecified: Secondary | ICD-10-CM

## 2022-12-20 DIAGNOSIS — G894 Chronic pain syndrome: Secondary | ICD-10-CM | POA: Diagnosis not present

## 2022-12-20 DIAGNOSIS — D571 Sickle-cell disease without crisis: Secondary | ICD-10-CM | POA: Diagnosis not present

## 2022-12-20 NOTE — Progress Notes (Signed)
Established Patient Office Visit  Subjective   Patient ID: Lindsey [name], female    DOB: Sep 11, 1970  Age: 52 y.o. MRN: [id number]  Chief Complaint  Patient presents with   Sickle Cell Anemia    Follow up     Lindsey [name] is a very pleasant 52 year old female with a medical history significant for sickle cell disease and chronic pain presents for follow-up of chronic conditions.  Lindsey [name] states that she has been doing well and is without complaint on today.  Patient started buprenorphine therapy for chronic pain 3 months ago.  Patient has been doing very well on this medication.  She states that she does not have any pain and has not had sickle cell crisis.  Patient states that "this is the best that I've felt in years".  Patient continues with folic acid.  She denies any fever, chills, headache, shortness of breath, chest pain, urinary symptoms, nausea, vomiting, or diarrhea.    Patient Active Problem List   Diagnosis Date Noted   Sickle cell anemia with crisis (HCC) 05/03/2022   Acute low back pain 03/25/2022   GAD (generalized anxiety disorder) 03/25/2022   Sickle cell pain crisis (HCC) 02/11/2022   Acute sickle cell crisis (HCC) 01/28/2022   Anxiety and depression 12/29/2021   Anemia of chronic disease 10/29/2021   Sickle cell anemia (HCC) 09/10/2019   Sickle cell disease (HCC) 10/01/2017   Chronic pain 10/01/2017   Chronic narcotic use 09/25/2017   Past Medical History:  Diagnosis Date   Acute kidney injury (HCC) 01/18/2021   Acute pain of left shoulder    Cough 06/27/2020   Hypokalemia 06/27/2020   Hypoxia    Leukocytosis 09/03/2016   Opiate abuse, episodic (HCC) 09/25/2017   Opioid dependence in remission (HCC)    Right leg pain    Sickle cell crisis (HCC)    Vasoocclusive sickle cell crisis (HCC) 07/17/2021   Past Surgical History:  Procedure Laterality Date   APPENDECTOMY     CESAREAN SECTION     OTHER SURGICAL HISTORY     c-section   Social History    Tobacco Use   Smoking status: Some Days    Types: Cigars, Cigarettes   Smokeless tobacco: Never  Vaping Use   Vaping status: Former  Substance Use Topics   Alcohol use: Yes    Comment: occasionally   Drug use: No   Social History   Socioeconomic History   Marital status: Married    Spouse name: Not on file   Number of children: 2   Years of education: Not on file   Highest education level: Not on file  Occupational History   Not on file  Tobacco Use   Smoking status: Some Days    Types: Cigars, Cigarettes   Smokeless tobacco: Never  Vaping Use   Vaping status: Former  Substance and Sexual Activity   Alcohol use: Yes    Comment: occasionally   Drug use: No   Sexual activity: Yes    Birth control/protection: None  Other Topics Concern   Not on file  Social History Narrative   Lives at home with husband and son   Right handed   Social Determinants of Health   Financial Resource Strain: Not on file  Food Insecurity: No Food Insecurity (08/08/2022)   Hunger Vital Sign    Worried About Running Out of Food in the Last Year: Never true    Ran Out of Food in the Last  Year: Never true  Transportation Needs: No Transportation Needs (08/08/2022)   PRAPARE - Administrator, Civil Service (Medical): No    Lack of Transportation (Non-Medical): No  Physical Activity: Not on file  Stress: Not on file  Social Connections: Not on file  Intimate Partner Violence: Not At Risk (08/08/2022)   Humiliation, Afraid, Rape, and Kick questionnaire    Fear of Current or Ex-Partner: No    Emotionally Abused: No    Physically Abused: No    Sexually Abused: No   Family Status  Relation Name Status   Mother HTN Alive   Father GSW Deceased   Neg Hx  (Not Specified)  No partnership data on file   Family History  Problem Relation Age of Onset   Stroke Neg Hx        none that she knows of    Seizures Neg Hx    Allergies  Allergen Reactions   Ketamine Anxiety and Other  (See Comments)    Tachycardia      Review of Systems  Constitutional: Negative.   HENT: Negative.    Eyes: Negative.   Respiratory: Negative.    Cardiovascular: Negative.   Gastrointestinal: Negative.   Genitourinary: Negative.   Musculoskeletal:  Positive for back pain and joint pain.  Skin: Negative.   Neurological: Negative.   Psychiatric/Behavioral: Negative.        Objective:     BP 108/76   Pulse 73   Temp (!) 97.2 F (36.2 C)   Wt 148 lb (67.1 kg)   LMP 07/09/2021 (Approximate)   SpO2 97%   BMI 28.90 kg/m  BP Readings from Last 3 Encounters:  12/20/22 108/76  11/25/22 139/83  09/13/22 130/72   Wt Readings from Last 3 Encounters:  12/20/22 148 lb (67.1 kg)  09/13/22 146 lb (66.2 kg)  08/02/22 141 lb 12.8 oz (64.3 kg)   Physical Exam Constitutional:      Appearance: Normal appearance.  Eyes:     Pupils: Pupils are equal, round, and reactive to light.  Cardiovascular:     Rate and Rhythm: Normal rate and regular rhythm.  Pulmonary:     Effort: Pulmonary effort is normal.  Neurological:     Mental Status: She is alert.  Psychiatric:        Mood and Affect: Mood normal.        Thought Content: Thought content normal.        Judgment: Judgment normal.      No results found for any visits on 12/20/22.  Last CBC Lab Results  Component Value Date   WBC 9.3 09/13/2022   HGB 10.9 (L) 09/13/2022   HCT 32.6 (L) 09/13/2022   MCV 92 09/13/2022   MCH 30.8 09/13/2022   RDW 16.7 (H) 09/13/2022   PLT 453 (H) 09/13/2022   Last metabolic panel Lab Results  Component Value Date   GLUCOSE 109 (H) 09/13/2022   NA 144 09/13/2022   K 4.7 09/13/2022   CL 107 (H) 09/13/2022   CO2 22 09/13/2022   BUN 10 09/13/2022   CREATININE 0.86 09/13/2022   EGFR 82 09/13/2022   CALCIUM 10.0 09/13/2022   PHOS 3.0 05/03/2022   PROT 7.5 09/13/2022   ALBUMIN 4.7 09/13/2022   LABGLOB 2.8 09/13/2022   AGRATIO 1.7 09/13/2022   BILITOT 1.1 09/13/2022   ALKPHOS 51  09/13/2022   AST 26 09/13/2022   ALT 16 09/13/2022   ANIONGAP 8 08/09/2022   Last lipids Lab  Results  Component Value Date   CHOL  07/25/2007    119        ATP III CLASSIFICATION:  <200     mg/dL   Desirable  161-096  mg/dL   Borderline High  >=045    mg/dL   High   HDL 33 (L) 40/98/1191   LDLCALC  07/25/2007    78        Total Cholesterol/HDL:CHD Risk Coronary Heart Disease Risk Table                     Men   Women  1/2 Average Risk   3.4   3.3   TRIG 38 07/25/2007   CHOLHDL 3.6 07/25/2007   Last hemoglobin A1c No results found for: "HGBA1C" Last thyroid functions Lab Results  Component Value Date   TSH 3.303 05/04/2022   Last vitamin D Lab Results  Component Value Date   VD25OH 19.4 (L) 09/13/2022   Last vitamin B12 and Folate No results found for: "VITAMINB12", "FOLATE"    The ASCVD Risk score (Arnett DK, et al., 2019) failed to calculate for the following reasons:   Cannot find a previous HDL lab   Cannot find a previous total cholesterol lab    Assessment & Plan:   Problem List Items Addressed This Visit       Other   Chronic pain - Primary   Relevant Orders   [id number] 11+Oxyco+Alc+Crt-Bund   Other Visit Diagnoses     Vitamin D deficiency       Relevant Orders   Sickle Cell Panel   Hb-SS disease without crisis (HCC)       Relevant Orders   Sickle Cell Panel      1. Chronic pain syndrome Reviewed PDMP substance reporting system prior to prescribing opiate medications. No inconsistencies noted.   - [id number] 11+Oxyco+Alc+Crt-Bund  2. Vitamin D deficiency  - Sickle Cell Panel  3. Hb-SS disease without crisis (HCC) Will continue buprenorphine therapy.  Continue folic acid - Sickle Cell Panel  Return in about 6 months (around 06/22/2023) for sickle cell anemia.    [name]  APRN, MSN, FNP-C Patient Care [facility] [address] [place], [place] [address] [phone]

## 2023-01-04 ENCOUNTER — Encounter: Payer: Self-pay | Admitting: Family Medicine

## 2023-01-11 ENCOUNTER — Other Ambulatory Visit: Payer: Self-pay | Admitting: Family Medicine

## 2023-01-11 DIAGNOSIS — F1120 Opioid dependence, uncomplicated: Secondary | ICD-10-CM

## 2023-01-11 DIAGNOSIS — G894 Chronic pain syndrome: Secondary | ICD-10-CM

## 2023-01-11 MED ORDER — BUPRENORPHINE HCL-NALOXONE HCL 2-0.5 MG SL SUBL
2.0000 | SUBLINGUAL_TABLET | Freq: Every day | SUBLINGUAL | 0 refills | Status: DC
Start: 2023-01-14 — End: 2023-02-13

## 2023-01-11 NOTE — Telephone Encounter (Signed)
Reviewed PDMP substance reporting system prior to prescribing opiate medications. No inconsistencies noted.  Meds ordered this encounter  Medications   buprenorphine-naloxone (SUBOXONE) 2-0.5 mg SUBL SL tablet    Sig: Place 2 tablets under the tongue daily.    Dispense:  60 tablet    Refill:  0    Order Specific Question:   Supervising Provider    Answer:   Quentin Angst [[id number]]   [name]  APRN, MSN, FNP-C Patient Care [facility] [address] [place], [place] [address] [phone]

## 2023-02-13 ENCOUNTER — Other Ambulatory Visit: Payer: Self-pay | Admitting: Family Medicine

## 2023-02-13 DIAGNOSIS — G894 Chronic pain syndrome: Secondary | ICD-10-CM

## 2023-02-13 DIAGNOSIS — F1120 Opioid dependence, uncomplicated: Secondary | ICD-10-CM

## 2023-02-13 MED ORDER — BUPRENORPHINE HCL-NALOXONE HCL 2-0.5 MG SL SUBL
2.0000 | SUBLINGUAL_TABLET | Freq: Every day | SUBLINGUAL | 0 refills | Status: DC
Start: 2023-02-13 — End: 2023-03-14

## 2023-02-13 NOTE — Telephone Encounter (Signed)
Reviewed PDMP substance reporting system prior to prescribing opiate medications. No inconsistencies noted.  Meds ordered this encounter  Medications   buprenorphine-naloxone (SUBOXONE) 2-0.5 mg SUBL SL tablet    Sig: Place 2 tablets under the tongue daily.    Dispense:  60 tablet    Refill:  0    Order Specific Question:   Supervising Provider    Answer:   Quentin Angst [[id number]]   [name]  APRN, MSN, FNP-C Patient Care [facility] [address] [place], [place] [address] [phone]

## 2023-03-11 ENCOUNTER — Other Ambulatory Visit: Payer: Self-pay | Admitting: Family Medicine

## 2023-03-11 DIAGNOSIS — D5700 Hb-SS disease with crisis, unspecified: Secondary | ICD-10-CM

## 2023-03-11 DIAGNOSIS — G894 Chronic pain syndrome: Secondary | ICD-10-CM

## 2023-03-14 ENCOUNTER — Other Ambulatory Visit: Payer: Self-pay | Admitting: Family Medicine

## 2023-03-14 DIAGNOSIS — F1120 Opioid dependence, uncomplicated: Secondary | ICD-10-CM

## 2023-03-14 DIAGNOSIS — G894 Chronic pain syndrome: Secondary | ICD-10-CM

## 2023-03-14 MED ORDER — BUPRENORPHINE HCL-NALOXONE HCL 2-0.5 MG SL SUBL
2.0000 | SUBLINGUAL_TABLET | Freq: Every day | SUBLINGUAL | 0 refills | Status: DC
Start: 2023-03-16 — End: 2023-04-11

## 2023-03-14 NOTE — Telephone Encounter (Signed)
Reviewed PDMP substance reporting system prior to prescribing opiate medications. No inconsistencies noted.  Meds ordered this encounter  Medications   buprenorphine-naloxone (SUBOXONE) 2-0.5 mg SUBL SL tablet    Sig: Place 2 tablets under the tongue daily.    Dispense:  60 tablet    Refill:  0    Order Specific Question:   Supervising Provider    Answer:   Quentin Angst [[id number]]   [name]  APRN, MSN, FNP-C Patient Care [facility] [address] [place], [place] [address] [phone]

## 2023-03-31 ENCOUNTER — Ambulatory Visit: Payer: MEDICAID

## 2023-03-31 ENCOUNTER — Ambulatory Visit
Admission: EM | Admit: 2023-03-31 | Discharge: 2023-03-31 | Disposition: A | Discharge status: Home / Self Care | Payer: MEDICAID | Attending: Family Medicine | Admitting: Family Medicine

## 2023-03-31 DIAGNOSIS — R0789 Other chest pain: Secondary | ICD-10-CM

## 2023-03-31 DIAGNOSIS — M25511 Pain in right shoulder: Secondary | ICD-10-CM

## 2023-03-31 MED ORDER — KETOROLAC TROMETHAMINE 30 MG/ML IJ SOLN
30.0000 mg | Freq: Once | INTRAMUSCULAR | Status: AC
  Administered 2023-03-31: 30 mg via INTRAMUSCULAR

## 2023-03-31 MED ORDER — KETOROLAC TROMETHAMINE 10 MG PO TABS: 10.0000 mg | ORAL_TABLET | Freq: Four times a day (QID) | ORAL | 0 refills | Status: DC | PRN

## 2023-03-31 MED ORDER — TIZANIDINE HCL 4 MG PO TABS: 4.0000 mg | ORAL_TABLET | Freq: Three times a day (TID) | ORAL | 0 refills | Status: DC | PRN

## 2023-03-31 NOTE — ED Provider Notes (Signed)
[facility]    CSN: [id number] Arrival date & time: 03/31/23  4098      History   Chief Complaint Chief Complaint  Patient presents with   Shoulder Pain    HPI Airyn Cotes is a 52 y.o. female.    Shoulder Pain  Patient is here for right shoulder pain.  It is throbbing.  Cannot move her neck, or move her shoulder due to pain.  Pain goes into the chest and into the posterior shoulder.  This has been going on x 3-4 days.  It did start last week but not as bad.  It stopped and came back.  No  known injury.  No recent heavy lifting.  She took some left over toradol, and then advil/motrin.  She also used a lidocaine patch.   She does have sickle cell, but does not think this is a crises as this is very atypical for her.  She is on suboxone currently. She does not want narcotics for pain.       Past Medical History:  Diagnosis Date   Acute kidney injury (HCC) 01/18/2021   Acute pain of left shoulder    Cough 06/27/2020   Hypokalemia 06/27/2020   Hypoxia    Leukocytosis 09/03/2016   Opiate abuse, episodic (HCC) 09/25/2017   Opioid dependence in remission (HCC)    Right leg pain    Sickle cell crisis (HCC)    Vasoocclusive sickle cell crisis (HCC) 07/17/2021    Patient Active Problem List   Diagnosis Date Noted   Sickle cell anemia with crisis (HCC) 05/03/2022   Acute low back pain 03/25/2022   GAD (generalized anxiety disorder) 03/25/2022   Sickle cell pain crisis (HCC) 02/11/2022   Acute sickle cell crisis (HCC) 01/28/2022   Anxiety and depression 12/29/2021   Anemia of chronic disease 10/29/2021   Sickle cell anemia (HCC) 09/10/2019   Sickle cell disease (HCC) 10/01/2017   Chronic pain 10/01/2017   Chronic narcotic use 09/25/2017    Past Surgical History:  Procedure Laterality Date   APPENDECTOMY     CESAREAN SECTION     OTHER SURGICAL HISTORY     c-section    OB History   No obstetric history on file.      Home Medications     Prior to Admission medications   Medication Sig Start Date End Date Taking? Authorizing Provider  buprenorphine-naloxone (SUBOXONE) 2-0.5 mg SUBL SL tablet Place 2 tablets under the tongue daily. 03/16/23   Massie Maroon, FNP  carboxymethylcellulose (REFRESH PLUS) 0.5 % SOLN Place 1 drop into both eyes daily as needed (dry eyes).    [provider]  gabapentin (NEURONTIN) 400 MG capsule TAKE 1 CAPSULE(400 MG) BY MOUTH THREE TIMES DAILY 03/13/23   Massie Maroon, FNP  hydroxyurea (HYDREA) 500 MG capsule Take 1 capsule (500 mg total) by mouth daily. May take with food to minimize GI side effects. Patient not taking: Reported on 12/20/2022 06/14/22   Massie Maroon, FNP  ketorolac (TORADOL) 10 MG tablet Take 1 tablet (10 mg total) by mouth every 6 (six) hours as needed (pain). 11/25/22   Banister, Janace Aris, MD  TYLENOL 8 HOUR ARTHRITIS PAIN 650 MG CR tablet Take 650 mg by mouth every 8 (eight) hours as needed for pain.    [provider]    Family History Family History  Problem Relation Age of Onset   Stroke Neg Hx        none that  she knows of    Seizures Neg Hx     Social History Social History   Tobacco Use   Smoking status: Some Days    Types: Cigars, Cigarettes   Smokeless tobacco: Never  Vaping Use   Vaping status: Former  Substance Use Topics   Alcohol use: Yes    Comment: occasionally   Drug use: No     Allergies   Ketamine   Review of Systems Review of Systems  Constitutional: Negative.   HENT: Negative.    Respiratory: Negative.    Cardiovascular: Negative.   Gastrointestinal: Negative.   Musculoskeletal:  Positive for arthralgias.  Skin: Negative.   Psychiatric/Behavioral: Negative.       Physical Exam Triage Vital Signs ED Triage Vitals  Encounter Vitals Group     BP 03/31/23 0918 130/86     Systolic BP Percentile --      Diastolic BP Percentile --      Pulse Rate 03/31/23 0918 69     Resp 03/31/23 0918 16     Temp  03/31/23 0918 99.2 F (37.3 C)     Temp Source 03/31/23 0918 Oral     SpO2 03/31/23 0918 97 %     Weight --      Height --      Head Circumference --      Peak Flow --      Pain Score 03/31/23 0917 6     Pain Loc --      Pain Education --      Exclude from Growth Chart --    No data found.  Updated Vital Signs BP 130/86 (BP Location: Left Arm)   Pulse 69   Temp 99.2 F (37.3 C) (Oral)   Resp 16   LMP 07/09/2021 (Approximate)   SpO2 97%   Visual Acuity Right Eye Distance:   Left Eye Distance:   Bilateral Distance:    Right Eye Near:   Left Eye Near:    Bilateral Near:     Physical Exam Constitutional:      General: She is in acute distress.     Appearance: Normal appearance. She is not ill-appearing.     Comments: In tears due to pain  Musculoskeletal:     Comments: No spinous TTP;  TTP to the right trapezius from the base of the neck to the shoulder;  she hast TTP to the right shoulder;  very TTP to the right anterior chest wall;  very limited rom of the shoulder due to pain.   Skin:    General: Skin is warm.  Neurological:     General: No focal deficit present.     Mental Status: She is alert.  Psychiatric:        Mood and Affect: Mood normal.      UC Treatments / Results  Labs (all labs ordered are listed, but only abnormal results are displayed) Labs Reviewed - No data to display  EKG   Radiology No results found.  Procedures Procedures (including critical care time)  Medications Ordered in UC Medications  ketorolac (TORADOL) 30 MG/ML injection 30 mg (30 mg Intramuscular Given 03/31/23 1039)    Initial Impression / Assessment and Plan / UC Course  I have reviewed the triage vital signs and the nursing notes.  Pertinent labs & imaging results that were available during my care of the patient were reviewed by me and considered in my medical decision making (see chart for details).  Final Clinical Impressions(s) / UC Diagnoses   Final  diagnoses:  Acute pain of right shoulder  Chest wall pain     Discharge Instructions      You were seen today for right shoulder, back and neck pain.  As discussed, your xray appears normal.  This may be a sickle cell crisis.  I have sent out medications to help with pain control, but if not helpful or symptoms worsen then please go to the ER for further evaluation.     ED Prescriptions     Medication Sig Dispense Auth. Provider   ketorolac (TORADOL) 10 MG tablet Take 1 tablet (10 mg total) by mouth every 6 (six) hours as needed (pain). 20 tablet Kinley Dozier, MD   tiZANidine (ZANAFLEX) 4 MG tablet Take 1 tablet (4 mg total) by mouth every 8 (eight) hours as needed for muscle spasms. 30 tablet Jannifer Franklin, MD      PDMP not reviewed this encounter.   Jannifer Franklin, MD 03/31/23 [phone]

## 2023-03-31 NOTE — ED Triage Notes (Addendum)
Patient presents to UC for right shoulder pain x 2 weeks ago. States it worsened 4 days. States it increases with movement of arm. Treating with Advil, Toradol, lidocaine patch with no relief. Last dose of Advil 600 mg this morning.   Denies injury.

## 2023-03-31 NOTE — Discharge Instructions (Signed)
You were seen today for right shoulder, back and neck pain.  As discussed, your xray appears normal.  This may be a sickle cell crisis.  I have sent out medications to help with pain control, but if not helpful or symptoms worsen then please go to the ER for further evaluation.

## 2023-04-02 ENCOUNTER — Emergency Department (HOSPITAL_COMMUNITY): Payer: MEDICAID

## 2023-04-02 ENCOUNTER — Other Ambulatory Visit: Payer: Self-pay

## 2023-04-02 ENCOUNTER — Encounter (HOSPITAL_COMMUNITY): Payer: Self-pay

## 2023-04-02 ENCOUNTER — Emergency Department (HOSPITAL_COMMUNITY)
Admission: EM | Admit: 2023-04-02 | Discharge: 2023-04-02 | Disposition: A | Discharge status: Home / Self Care | Payer: MEDICAID | Attending: Emergency Medicine | Admitting: Emergency Medicine

## 2023-04-02 DIAGNOSIS — M87011 Idiopathic aseptic necrosis of right shoulder: Secondary | ICD-10-CM | POA: Insufficient documentation

## 2023-04-02 DIAGNOSIS — D5700 Hb-SS disease with crisis, unspecified: Secondary | ICD-10-CM | POA: Insufficient documentation

## 2023-04-02 DIAGNOSIS — M87019 Idiopathic aseptic necrosis of unspecified shoulder: Secondary | ICD-10-CM

## 2023-04-02 LAB — COMPREHENSIVE METABOLIC PANEL
ALT: 24 U/L (ref 0–44)
AST: 23 U/L (ref 15–41)
Albumin: 3.9 g/dL (ref 3.5–5.0)
Alkaline Phosphatase: 44 U/L (ref 38–126)
Anion gap: 6 (ref 5–15)
BUN: 15 mg/dL (ref 6–20)
CO2: 25 mmol/L (ref 22–32)
Calcium: 8.8 mg/dL — ABNORMAL LOW (ref 8.9–10.3)
Chloride: 110 mmol/L (ref 98–111)
Creatinine, Ser: 0.86 mg/dL (ref 0.44–1.00)
GFR, Estimated: >60 mL/min (ref >60)
Glucose, Bld: 106 mg/dL — ABNORMAL HIGH (ref 70–99)
Potassium: 4.3 mmol/L (ref 3.5–5.1)
Sodium: 141 mmol/L (ref 135–145)
Total Bilirubin: 1.2 mg/dL — ABNORMAL HIGH (ref <1.2)
Total Protein: 7.1 g/dL (ref 6.5–8.1)

## 2023-04-02 LAB — RETICULOCYTES
Immature Retic Fract: 22.6 % — ABNORMAL HIGH (ref 2.3–15.9)
RBC.: 2.84 MIL/uL — ABNORMAL LOW (ref 3.87–5.11)
Retic Count, Absolute: 125.8 10*3/uL (ref 19.0–186.0)
Retic Ct Pct: 4.4 % — ABNORMAL HIGH (ref 0.4–3.1)

## 2023-04-02 LAB — CBC WITH DIFFERENTIAL/PLATELET
Abs Immature Granulocytes: 0.02 10*3/uL (ref 0.00–0.07)
Basophils Absolute: 0 10*3/uL (ref 0.0–0.1)
Basophils Relative: 0 %
Eosinophils Absolute: 0.4 10*3/uL (ref 0.0–0.5)
Eosinophils Relative: 4 %
HCT: 25 % — ABNORMAL LOW (ref 36.0–46.0)
Hemoglobin: 9.3 g/dL — ABNORMAL LOW (ref 12.0–15.0)
Immature Granulocytes: 0 %
Lymphocytes Relative: 31 %
Lymphs Abs: 2.6 10*3/uL (ref 0.7–4.0)
MCH: 31.1 pg (ref 26.0–34.0)
MCHC: 37.2 g/dL — ABNORMAL HIGH (ref 30.0–36.0)
MCV: 83.6 fL (ref 80.0–100.0)
Monocytes Absolute: 0.6 10*3/uL (ref 0.1–1.0)
Monocytes Relative: 6 %
Neutro Abs: 5 10*3/uL (ref 1.7–7.7)
Neutrophils Relative %: 59 %
Platelets: 378 10*3/uL (ref 150–400)
RBC: 2.99 MIL/uL — ABNORMAL LOW (ref 3.87–5.11)
RDW: 14.8 % (ref 11.5–15.5)
WBC: 8.5 10*3/uL (ref 4.0–10.5)
nRBC: 0.7 % — ABNORMAL HIGH (ref 0.0–0.2)

## 2023-04-02 MED ORDER — SODIUM CHLORIDE 0.9 % IV SOLN
12.5000 mg | Freq: Once | INTRAVENOUS | Status: AC
  Administered 2023-04-02: 12.5 mg via INTRAVENOUS
  Filled 2023-04-02: qty 0.25
  Filled 2023-04-02: qty 12.5

## 2023-04-02 MED ORDER — HYDROMORPHONE HCL 1 MG/ML IJ SOLN
0.5000 mg | INTRAMUSCULAR | Status: AC
  Administered 2023-04-02: 0.5 mg via INTRAVENOUS
  Filled 2023-04-02: qty 1

## 2023-04-02 MED ORDER — LIDOCAINE 5 % EX PTCH
1.0000 | MEDICATED_PATCH | CUTANEOUS | Status: DC
  Filled 2023-04-02: qty 1

## 2023-04-02 MED ORDER — HYDROMORPHONE HCL 1 MG/ML IJ SOLN
1.0000 mg | INTRAMUSCULAR | Status: AC
  Filled 2023-04-02: qty 1

## 2023-04-02 MED ORDER — HYDROMORPHONE HCL 1 MG/ML IJ SOLN: 1.0000 mg | INTRAMUSCULAR | Status: AC

## 2023-04-02 MED ORDER — ONDANSETRON HCL 4 MG/2ML IJ SOLN: 4.0000 mg | INTRAMUSCULAR | Status: DC | PRN

## 2023-04-02 MED ORDER — ACETAMINOPHEN 500 MG PO TABS: 1000.0000 mg | ORAL_TABLET | Freq: Once | ORAL | Status: DC

## 2023-04-02 MED ORDER — KETOROLAC TROMETHAMINE 15 MG/ML IJ SOLN
15.0000 mg | INTRAMUSCULAR | Status: AC
  Administered 2023-04-02: 15 mg via INTRAVENOUS
  Filled 2023-04-02: qty 1

## 2023-04-02 NOTE — Discharge Instructions (Signed)
You are seen in the emergency room today for right shoulder pain.  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  I spoke with orthopedic doctor recommend follow-up in our clinic tomorrow at 8:30 in the morning.  I have attached information to discharge paper please follow-up with them.  I recommend taking Toradol as discussed, regular taking Tylenol.  You can also take muscle relaxer and continue using lidocaine patch and heat.  Please return to emergency room with any new or worsening symptoms.

## 2023-04-02 NOTE — ED Triage Notes (Signed)
Patient is here for evaluation of sickle cell crisis. Pt reports pain in right shoulder that radiates down into her chest. Reports being seen at urgent care for the same recently and was given toradol, which helped to begin with.

## 2023-04-02 NOTE — ED Provider Notes (Signed)
[place] [facility]MENT AT [facility] Provider Note   CSN: [id number] Arrival date & time: 04/02/23  4098     History  Chief Complaint  Patient presents with   Sickle Cell Pain Crisis    Lindsey [name] is a 52 y.o. female with past medical history of sickle cell disease, chronic pain, opioid dependence in remission presenting to emergency room with right shoulder pain.  Patient reports right shoulder pain has been ongoing for 5 days.  Patient feels like she is able to move her shoulder but it is worse with movements and worse when she lies on that shoulder.  She has no recent injuries traumas or fall.  Was recently seen in urgent care for the same thing and given Toradol.  She has been using heating pad lidocaine patch and Tylenol at home.  Patient reports she has not taken anything today but has used heating pad.  Patient has not had recent sickle cell pain crisis, feels this is atypical for her.  Patient is currently on Suboxone and does not want narcotics for pain control, however, requesting pain medication. Denies chest pain, tightness, shortness of breath, NVD.    Sickle Cell Pain Crisis      Home Medications Prior to Admission medications   Medication Sig Start Date End Date Taking? Authorizing Provider  buprenorphine-naloxone (SUBOXONE) 2-0.5 mg SUBL SL tablet Place 2 tablets under the tongue daily. 03/16/23   Massie Maroon, FNP  carboxymethylcellulose (REFRESH PLUS) 0.5 % SOLN Place 1 drop into both eyes daily as needed (dry eyes).    [provider]  gabapentin (NEURONTIN) 400 MG capsule TAKE 1 CAPSULE(400 MG) BY MOUTH THREE TIMES DAILY 03/13/23   Massie Maroon, FNP  hydroxyurea (HYDREA) 500 MG capsule Take 1 capsule (500 mg total) by mouth daily. May take with food to minimize GI side effects. Patient not taking: Reported on 12/20/2022 06/14/22   Massie Maroon, FNP  ketorolac (TORADOL) 10 MG tablet Take 1 tablet (10 mg total) by  mouth every 6 (six) hours as needed (pain). 03/31/23   Piontek, Denny Peon, MD  tiZANidine (ZANAFLEX) 4 MG tablet Take 1 tablet (4 mg total) by mouth every 8 (eight) hours as needed for muscle spasms. 03/31/23   Piontek, Denny Peon, MD  TYLENOL 8 HOUR ARTHRITIS PAIN 650 MG CR tablet Take 650 mg by mouth every 8 (eight) hours as needed for pain.    [provider]      Allergies    Ketamine    Review of Systems   Review of Systems  Musculoskeletal:  Positive for arthralgias.    Physical Exam Updated Vital Signs BP 130/81 (BP Location: Left Arm)   Pulse 77   Temp 98.8 F (37.1 C) (Oral)   Resp 18   Ht 5' (1.524 m)   Wt 67.1 kg   LMP 07/09/2021 (Approximate)   SpO2 98%   BMI 28.89 kg/m  Physical Exam Vitals and nursing note reviewed.  Constitutional:      General: She is not in acute distress.    Appearance: She is not toxic-appearing.  HENT:     Head: Normocephalic and atraumatic.  Eyes:     General: No scleral icterus.    Conjunctiva/sclera: Conjunctivae normal.  Cardiovascular:     Rate and Rhythm: Normal rate and regular rhythm.     Pulses: Normal pulses.     Heart sounds: Normal heart sounds.  Pulmonary:     Effort: Pulmonary effort is normal.  No respiratory distress.     Breath sounds: Normal breath sounds.     Comments: Tearful but otherwise well-appearing.  No respiratory distress. Abdominal:     General: Abdomen is flat. Bowel sounds are normal.     Palpations: Abdomen is soft.     Tenderness: There is no abdominal tenderness.  Musculoskeletal:     Right lower leg: No edema.     Left lower leg: No edema.     Comments: Tenderness to palpation along deltoid muscle anteriorly and posteriorly.  Patient neurovascularly intact with strong radial pulse bilaterally.  No palpable area of swelling over joint or erythema.  No area of deformity.  Skin:    General: Skin is warm and dry.     Findings: No lesion.  Neurological:     General: No focal deficit present.      Mental Status: She is alert and oriented to person, place, and time. Mental status is at baseline.     ED Results / Procedures / Treatments   Labs (all labs ordered are listed, but only abnormal results are displayed) Labs Reviewed - No data to display  EKG None  Radiology DG Shoulder Right  Result Date: 03/31/2023 CLINICAL DATA:  Right shoulder pain over the last 2 weeks, worsening EXAM: RIGHT SHOULDER - 2+ VIEW COMPARISON:  None Available. FINDINGS: Increased diffuse sclerosis in the skeleton including the humerus and scapula, probably a manifestation of the patient's sickle cell disease although other potential causes might include renal osteodystrophy, mastocytosis, myelofibrosis, and other etiologies. Moderate spurring of the acetabulum and humeral head. AC joint unremarkable. No malalignment, fracture, or acute bony findings. IMPRESSION: 1. Increased diffuse sclerosis in the skeleton, probably a manifestation of the patient's sickle cell disease although other potential causes might include renal osteodystrophy, mastocytosis, myelofibrosis, and other etiologies. 2. Moderate spurring of the humeral head and acetabulum. Electronically Signed   By: Gaylyn Rong M.D.   On: 03/31/2023 12:04    Procedures Procedures    Medications Ordered in ED Medications - No data to display  ED Course/ Medical Decision Making/ A&P Clinical Course as of 04/02/23 1712  Sun Apr 02, 2023  1123 Spoke with pharmacy on pain control -pharmacy feels that buprenorphine use okay to treat as typical sickle cell pain crisis with Dilaudid since pain has been unresponsive to over-the-counter pain management as well as Toradol [JB]    Clinical Course User Index [JB] Demarco Bacci, Horald Chestnut, PA-C                                 Medical Decision Making Amount and/or Complexity of Data Reviewed Labs: ordered. Radiology: ordered.  Risk OTC drugs. Prescription drug management.   This patient presents to the  ED for concern of sickle cell pain crisis, this involves an extensive number of treatment options, and is a complaint that carries with it a high risk of complications and morbidity.  The differential diagnosis includes avascular necrosis, fracture,'s sprain, acute chest syndrome, chronic pain, sickle cell crisis   Co morbidities that complicate the patient evaluation  SCD   Additional history obtained:  None   Lab Tests:  I personally interpreted labs.  The pertinent results include:   Cbc no leukocytosis, hemoglobin 9.3 which appears stable to prior recent labs Cmp no obvious electrolyte abnormality Reticulocyte 4.4%   Imaging Studies ordered:  I ordered imaging studies including chest x-ray   I independently visualized  and interpreted imaging which showed no cardiopulmonary pathology shows concern for right-sided avascular necrosis I agree with the radiologist interpretation   Cardiac Monitoring: / EKG:  The patient was maintained on a cardiac monitor.   Consultations Obtained:  I requested consultation with the ortho,  and discussed lab and imaging findings as well as pertinent plan - they recommend: No immediate action to emergency room patient can follow-up tomorrow in office at 8 AM for joint injection and further outpatient management   Problem List / ED Course / Critical interventions / Medication management  Patient reporting right arm pain.  Patient had normal right arm x-ray at urgent care yesterday.  Chest x-ray shows concern for avascular necrosis right arm.  Patient neurovascularly intact and pain under control prior to discharge.  Consulted Ortho for follow-up who recommended coming to office tomorrow at 8 AM and trying joint injection.  Patient agrees.  Patient's labs reassuring without leukocytosis without significant change in hemoglobin from baseline.  Does not appear to have systemic illness, prior to discharge patient well-appearing and reporting  improvement symptoms discussed follow-up and return precautions for ED I ordered medication including tried managing pain with Toradol, heat and Tylenol however patient's symptoms are severe thus consulted pharmacy for further pain control.  Recommended daughter.  Patient's pain improved significantly after 1 dose of IV Dilaudid.  Patient does not want any further outpatient narcotics and will follow-up with primary care or pain management doctor for further care. Reevaluation of the patient after these medicines showed that the patient improved I have reviewed the patients home medicines and have made adjustments as needed   Plan  F/u w/ PCP in 2-3d to ensure resolution of sx.  Patient was given return precautions. Patient stable for discharge at this time.  Patient educated on sx/dx and verbalized understanding of plan. Return to ER w/ new or worsening sx.          Final Clinical Impression(s) / ED Diagnoses Final diagnoses:  Sickle cell pain crisis (HCC)  Avascular necrosis of bone of shoulder [facility])    Rx / DC Orders ED Discharge Orders     None         Reinaldo Raddle 04/02/23 1716    Arby Barrette, MD 04/05/23 2323

## 2023-04-11 ENCOUNTER — Other Ambulatory Visit: Payer: Self-pay | Admitting: Family Medicine

## 2023-04-11 DIAGNOSIS — G894 Chronic pain syndrome: Secondary | ICD-10-CM

## 2023-04-11 DIAGNOSIS — F1120 Opioid dependence, uncomplicated: Secondary | ICD-10-CM

## 2023-04-12 MED ORDER — BUPRENORPHINE HCL-NALOXONE HCL 2-0.5 MG SL SUBL: 2.0000 | SUBLINGUAL_TABLET | Freq: Every day | SUBLINGUAL | 0 refills | Status: DC

## 2023-04-12 NOTE — Telephone Encounter (Signed)
subs

## 2023-04-12 NOTE — Telephone Encounter (Signed)
Reviewed PDMP substance reporting system prior to prescribing opiate medications. No inconsistencies noted.  Meds ordered this encounter  Medications   DISCONTD: buprenorphine-naloxone (SUBOXONE) 2-0.5 mg SUBL SL tablet    Sig: Place 2 tablets under the tongue daily.    Dispense:  60 tablet    Refill:  0    Order Specific Question:   Supervising Provider    Answer:   Quentin Angst [[id number]]   buprenorphine-naloxone (SUBOXONE) 2-0.5 mg SUBL SL tablet    Sig: Place 2 tablets under the tongue daily.    Dispense:  60 tablet    Refill:  0    Order Specific Question:   Supervising Provider    Answer:   Quentin Angst [[id number]]   Lindsey Nations  APRN, MSN, FNP-C [facility] [address] [place], [place] [address] [phone]

## 2023-06-27 ENCOUNTER — Ambulatory Visit: Payer: Self-pay | Admitting: Family Medicine

## 2023-06-27 ENCOUNTER — Other Ambulatory Visit: Payer: Self-pay | Admitting: Nurse Practitioner

## 2023-06-27 ENCOUNTER — Ambulatory Visit (INDEPENDENT_AMBULATORY_CARE_PROVIDER_SITE_OTHER): Payer: MEDICAID | Admitting: Nurse Practitioner

## 2023-06-27 ENCOUNTER — Encounter: Payer: Self-pay | Admitting: Nurse Practitioner

## 2023-06-27 VITALS — BP 122/59 | HR 71 | Temp 97.2°F | Wt 152.0 lb

## 2023-06-27 DIAGNOSIS — G4700 Insomnia, unspecified: Secondary | ICD-10-CM | POA: Insufficient documentation

## 2023-06-27 DIAGNOSIS — D571 Sickle-cell disease without crisis: Secondary | ICD-10-CM | POA: Insufficient documentation

## 2023-06-27 DIAGNOSIS — Z1231 Encounter for screening mammogram for malignant neoplasm of breast: Secondary | ICD-10-CM | POA: Insufficient documentation

## 2023-06-27 DIAGNOSIS — Z23 Encounter for immunization: Secondary | ICD-10-CM | POA: Diagnosis not present

## 2023-06-27 DIAGNOSIS — G894 Chronic pain syndrome: Secondary | ICD-10-CM | POA: Diagnosis not present

## 2023-06-27 NOTE — Patient Instructions (Signed)
At a minimum, adult patients with SCD should complete the following primary series (per 2014 SCD Evidence Based Guidelines):  One Dose of PCV13 (Prevnar) followed by PPSV23 (Pneumovax) at least 8 weeks later   One Dose of Haemophilus Influenzae type B (HIB), followed by One Dose of MenACWY (Menveo) one month later  Two Doses of MenB (Bexero) one month apart (this can be given at the same time as HIB and/or MenACWY visits)  We also recommend that patients receive:  Annual Influenza Vaccine  Booster of PPSV23 (Pneumovax) every 5 years  Booster of MenACWY (Menveo) every 5 years  We also suggest for specific patients:  Immunization for Hepatitis B (Twinrix at 0, 1 and 6 month intervals) for patients receiving regular blood transfusions, history of HIV, current or recent drug use or incarceration  Tdap booster every 10 years  Tdap booster for all pregnant women  If no history of primary vaccination series for tetanus, diphtheria, or pertussis:   At least 1 dose Tdap followed by 1 dose Td or Tdap at least 4 weeks later, another dose Td or Tdap 6-12 months later, and a booster every 10 years  1 dose Tdap during each pregnancy      It is important that you exercise regularly at least 30 minutes 5 times a week as tolerated  Think about what you will eat, plan ahead. Choose " clean, green, fresh or frozen" over canned, processed or packaged foods which are more sugary, salty and fatty. 70 to 75% of food eaten should be vegetables and fruit. Three meals at set times with snacks allowed between meals, but they must be fruit or vegetables. Aim to eat over a 12 hour period , example 7 am to 7 pm, and STOP after  your last meal of the day. Drink water,generally about 64 ounces per day, no other drink is as healthy. Fruit juice is best enjoyed in a healthy way, by EATING the fruit.  Thanks for choosing [facility] we consider it a privelige to serve you.

## 2023-06-27 NOTE — Assessment & Plan Note (Addendum)
 Continue gabapentin 400 mg 3 times daily, Suboxone 2-0.5 mg sublingual tablets, Tylenol arthritis 650 mg every 8 hours as needed Need to avoid use of marijuana discussed - Sickle Cell Panel - Reticulocytes - ToxAssure Flex 15, Ur

## 2023-06-27 NOTE — Assessment & Plan Note (Signed)
 Sickle cell disease - The patient was reminded of the need to seek medical attention of any symptoms of bleeding, anemia, or infection.  Has hydroxyurea 500 mg daily ordered but she is not taking the medication   Eye - High risk of proliferative retinopathy. Annual eye exam with retinal exam recommended to patient.  Immunization status - Patient received influenza vaccination on today  Acute and chronic painful episodes -    We discussed that pt is to receive her Schedule II prescriptions only from [place]. Pt is also aware that the prescription history is available to [place] online through the [facility] CSRS. We reminded the patient that all patients receiving Schedule II narcotics must be seen for follow within the 3 months in the office.  We reviewed the terms of our pain agreement, including the need to keep medicines in a safe locked location away from children or pets, and the need to report excess sedation or constipation, measures to avoid constipation, and policies related to early refills and stolen prescriptions. According to the [place] Chronic [facility] Initiative program, we have reviewed details related to analgesia, adverse effects, aberrant behaviors. Reviewed [place] Substance Reporting system prior to prescribing opiate medication, no inconsistencies noted.   Continue Tylenol arthritis 650 mg every 8 hours as needed, gabapentin 400 mg 3 times daily as needed, Suboxone 2-0.5 mg tablets daily    Chronic pain syndrome  - Sickle Cell Panel - Reticulocytes - ToxAssure Flex 15, Ur

## 2023-06-27 NOTE — Assessment & Plan Note (Signed)
 Sleep hygiene discussed Home sleep study ordered

## 2023-06-27 NOTE — Progress Notes (Signed)
 New Patient Office Visit  Subjective:  Patient ID: Lindsey [name], female    DOB: 1970-08-15  Age: 53 y.o. MRN: [id number]  CC:  Chief Complaint  Patient presents with   Sickle Cell Anemia    HPI Lindsey [name] is a 53 y.o. 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  Patient presented establish care for her chronic medical conditions.  Previous PCP Julianne Handler NP  Sickle cell disease.  Currently on Suboxone, 2-0.5 mg sublingual tablet, 2 tablets daily. stated that she was started on this medication because she was addicted to opiates.  Stated that she has felt a lot better since she stopped taking opiates .  She is currently trying to wean off Suboxone and has been taking 1 tablet instead of 2 tablets daily.  Her goal is to not take any controlled substance for pain. takes gabapentin 400 mg 3 times daily, Tylenol arthritis 650 mg every 8 hours as needed. patient currently denies fever, chills, chest pain, shortness of breath, abdominal pain nausea vomiting.  Has not been taking hydroxyurea,, not sure why she is not taking med, also not on folic acid.  She is not followed by hematology at this time.  Insomnia.  Patient complains of chronic insomnia, gets 3 to 4 hours of sleep nightly and does not feel well rested when she wakes up in the morning.  She reports snoring and apnea. states that she has tried different medications including hydroxyzine, melatonin without success.  Smokes marijuana to help her sleep   Mammogram ordered, plans for cervical cancer screening at next visit.  Flu vaccine given in the office today.  Patient encouraged to get shingles vaccine and  pneumococcal vaccine at the pharmacy         Past Medical History:  Diagnosis Date   Acute kidney injury (HCC) 01/18/2021   Acute pain of left shoulder    Cough 06/27/2020   Hypokalemia 06/27/2020   Hypoxia    Leukocytosis 09/03/2016   Opiate abuse, episodic (HCC) 09/25/2017   Opioid dependence in remission (HCC)    Right leg pain    Sickle cell crisis (HCC)    Vasoocclusive sickle cell crisis (HCC) 07/17/2021    Past Surgical History:  Procedure Laterality Date   APPENDECTOMY     CESAREAN SECTION     OTHER SURGICAL HISTORY     c-section    Family History  Problem Relation Age of Onset   Stroke Neg Hx        none that she knows of    Seizures Neg Hx     Social History   Socioeconomic History   Marital status: Legally Separated    Spouse name: Not on file   Number of children: 2   Years of education: Not on file   Highest education level: Not on file  Occupational History   Not on file  Tobacco Use   Smoking status: Former    Types: Cigars, Cigarettes   Smokeless tobacco: Never  Vaping Use   Vaping status: Former  Substance and Sexual Activity   Alcohol use: Yes    Comment: occasionally   Drug use: Yes    Types: Marijuana   Sexual activity: Yes    Birth control/protection:  None  Other Topics Concern   Not on file  Social History Narrative   Lives at home with  son   Right handed   Social Drivers of Health   Financial Resource Strain: Not on file  Food Insecurity: No Food Insecurity (08/08/2022)   Hunger Vital Sign    Worried About Running Out of Food in the Last Year: Never true    Ran Out of Food in the Last Year: Never true  Transportation Needs: No Transportation Needs (08/08/2022)   PRAPARE - Administrator, Civil Service (Medical): No    Lack of Transportation (Non-Medical): No  Physical Activity: Not on file  Stress: Not on file  Social Connections: Not on file  Intimate Partner Violence: Not At Risk (08/08/2022)   Humiliation,  Afraid, Rape, and Kick questionnaire    Fear of Current or Ex-Partner: No    Emotionally Abused: No    Physically Abused: No    Sexually Abused: No    ROS Review of Systems  Constitutional:  Negative for appetite change, chills, fatigue and fever.  HENT:  Negative for congestion, postnasal drip, rhinorrhea and sneezing.   Respiratory:  Negative for cough, shortness of breath and wheezing.   Cardiovascular:  Negative for chest pain, palpitations and leg swelling.  Gastrointestinal:  Negative for abdominal pain, constipation, nausea and vomiting.  Genitourinary:  Negative for difficulty urinating, dysuria, flank pain and frequency.  Musculoskeletal:  Negative for arthralgias, back pain, joint swelling and myalgias.  Skin:  Negative for color change, pallor, rash and wound.  Neurological:  Negative for dizziness, facial asymmetry, weakness, numbness and headaches.  Psychiatric/Behavioral:  Positive for sleep disturbance. Negative for behavioral problems, confusion, self-injury and suicidal ideas.     Objective:   Today's Vitals: BP (!) 122/59   Pulse 71   Temp (!) 97.2 F (36.2 C)   Wt 152 lb (68.9 kg)   LMP 07/09/2021 (Approximate)   SpO2 100%   BMI 29.69 kg/m   Physical Exam Vitals and nursing note reviewed.  Constitutional:      General: She is not in acute distress.    Appearance: Normal appearance. She is not ill-appearing, toxic-appearing or diaphoretic.  HENT:     Mouth/Throat:     Mouth: Mucous membranes are moist.     Pharynx: Oropharynx is clear. No oropharyngeal exudate or posterior oropharyngeal erythema.  Eyes:     General: Scleral icterus present.        Right eye: No discharge.        Left eye: No discharge.     Extraocular Movements: Extraocular movements intact.     Conjunctiva/sclera: Conjunctivae normal.  Cardiovascular:     Rate and Rhythm: Normal rate and regular rhythm.     Pulses: Normal pulses.     Heart sounds: Normal heart sounds. No murmur  heard.    No friction rub. No gallop.  Pulmonary:     Effort: Pulmonary effort is normal. No respiratory distress.     Breath sounds: Normal breath sounds. No stridor. No wheezing, rhonchi or rales.  Chest:     Chest wall: No tenderness.  Abdominal:     General: There is no distension.     Palpations: Abdomen is soft.     Tenderness: There is no abdominal tenderness. There is no right CVA tenderness, left CVA tenderness or guarding.  Musculoskeletal:        General: No swelling, tenderness, deformity or signs of injury.  Right lower leg: No edema.     Left lower leg: No edema.  Skin:    General: Skin is warm and dry.     Capillary Refill: Capillary refill takes less than 2 seconds.     Coloration: Skin is not jaundiced or pale.     Findings: No bruising, erythema or lesion.  Neurological:     Mental Status: She is alert and oriented to person, place, and time.     Motor: No weakness.     Coordination: Coordination normal.     Gait: Gait normal.  Psychiatric:        Mood and Affect: Mood normal.        Behavior: Behavior normal.        Thought Content: Thought content normal.        Judgment: Judgment normal.     Assessment & Plan:   Problem List Items Addressed This Visit       Other   Chronic pain   Continue gabapentin 400 mg 3 times daily, Suboxone 2-0.5 mg sublingual tablets, Tylenol arthritis 650 mg every 8 hours as needed Need to avoid use of marijuana discussed - Sickle Cell Panel - Reticulocytes - ToxAssure Flex 15, Ur       Relevant Orders   Sickle Cell Panel   Reticulocytes   ToxAssure Flex 15, Ur   Need for influenza vaccination   Patient educated on CDC recommendation for the vaccine. Verbal consent was obtained from the patient, vaccine administered by nurse, no sign of adverse reactions noted at this time. Patient education on arm soreness and use of tylenol o for this patient  was discussed. Patient educated on the signs and symptoms of adverse  effect and advise to contact the office if they occur. Vaccine information sheet given to patient.        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  Has hydroxyurea 500 mg daily ordered but she is not taking the medication   Eye - High risk of proliferative retinopathy. Annual eye exam with retinal exam recommended to patient.  Immunization status - Patient received influenza vaccination on today  Acute and chronic painful episodes -    We discussed that pt is to receive her Schedule II prescriptions only from [place]. Pt is also aware that the prescription history is available to [place] online through the [facility] CSRS. We reminded the patient that all patients receiving Schedule II narcotics must be seen for follow within the 3 months in the office.  We reviewed the terms of our pain agreement, including the need to keep medicines in a safe locked location away from children or pets, and the need to report excess sedation or constipation, measures to avoid constipation, and policies related to early refills and stolen prescriptions. According to the [place] [facility] program, we have reviewed details related to analgesia, adverse effects, aberrant behaviors. Reviewed [facility] Substance Reporting system prior to prescribing opiate medication, no inconsistencies noted.   Continue Tylenol arthritis 650 mg every 8 hours as needed, gabapentin 400 mg 3 times daily as needed, Suboxone 2-0.5 mg tablets daily    Chronic pain syndrome  - Sickle Cell Panel - Reticulocytes - ToxAssure Flex 15, Ur       Insomnia  Sleep hygiene discussed Home sleep study ordered      Relevant Orders   Home sleep test   Screening mammogram for breast cancer   Relevant Orders   MM  3D SCREENING MAMMOGRAM BILATERAL BREAST    Outpatient Encounter Medications as of 06/27/2023  Medication Sig   buprenorphine-naloxone (SUBOXONE) 2-0.5 mg SUBL SL tablet Place 2 tablets under the tongue daily.   carboxymethylcellulose (REFRESH PLUS) 0.5 % SOLN Place 1 drop into both eyes daily as needed (dry eyes).   gabapentin (NEURONTIN) 400 MG capsule TAKE 1 CAPSULE(400 MG) BY MOUTH THREE TIMES DAILY   TYLENOL 8 HOUR ARTHRITIS PAIN 650 MG CR tablet Take 650 mg by mouth every 8 (eight) hours as needed for pain.   hydroxyurea (HYDREA) 500 MG capsule Take 1 capsule (500 mg total) by mouth daily. May take with food to minimize GI side effects. (Patient not taking: Reported on 12/20/2022)   ketorolac (TORADOL) 10 MG tablet Take 1 tablet (10 mg total) by mouth every 6 (six) hours as needed (pain). (Patient not taking: Reported on 06/27/2023)   tiZANidine (ZANAFLEX) 4 MG tablet Take 1 tablet (4 mg total) by mouth every 8 (eight) hours as needed for muscle spasms. (Patient not taking: Reported on 06/27/2023)   No facility-administered encounter medications on file as of 06/27/2023.    Follow-up: Return in about 3 months (around 09/24/2023) for CPE.   Donell Beers, FNP

## 2023-06-27 NOTE — Assessment & Plan Note (Signed)
Patient educated on CDC recommendation for the vaccine. Verbal consent was obtained from the patient, vaccine administered by nurse, no sign of adverse reactions noted at this time. Patient education on arm soreness and use of tylenol o for this patient  was discussed. Patient educated on the signs and symptoms of adverse effect and advise to contact the office if they occur. Vaccine information sheet given to patient.

## 2023-06-28 ENCOUNTER — Other Ambulatory Visit: Payer: Self-pay | Admitting: Nurse Practitioner

## 2023-06-28 DIAGNOSIS — E559 Vitamin D deficiency, unspecified: Secondary | ICD-10-CM

## 2023-06-28 MED ORDER — VITAMIN D (ERGOCALCIFEROL) 1.25 MG (50000 UNIT) PO CAPS: 50000.0000 [IU] | ORAL_CAPSULE | ORAL | 2 refills | Status: DC

## 2023-06-30 LAB — CMP14+CBC/D/PLT+FER+RETIC+V...
ALT: 16 [IU]/L (ref 0–32)
AST: 24 [IU]/L (ref 0–40)
Albumin: 4.5 g/dL (ref 3.8–4.9)
Alkaline Phosphatase: 50 [IU]/L (ref 44–121)
BUN/Creatinine Ratio: 15 (ref 9–23)
BUN: 14 mg/dL (ref 6–24)
Basophils Absolute: 0.1 10*3/uL (ref 0.0–0.2)
Basos: 1 %
Bilirubin Total: 1 mg/dL (ref 0.0–1.2)
CO2: 23 mmol/L (ref 20–29)
Calcium: 9.6 mg/dL (ref 8.7–10.2)
Chloride: 106 mmol/L (ref 96–106)
Creatinine, Ser: 0.92 mg/dL (ref 0.57–1.00)
EOS (ABSOLUTE): 0.4 10*3/uL (ref 0.0–0.4)
Eos: 4 %
Ferritin: 453 ng/mL — ABNORMAL HIGH (ref 15–150)
Globulin, Total: 2.6 g/dL (ref 1.5–4.5)
Glucose: 91 mg/dL (ref 70–99)
Hematocrit: 31.7 % — ABNORMAL LOW (ref 34.0–46.6)
Hemoglobin: 10.7 g/dL — ABNORMAL LOW (ref 11.1–15.9)
Immature Grans (Abs): 0 10*3/uL (ref 0.0–0.1)
Immature Granulocytes: 0 %
Lymphocytes Absolute: 3.4 10*3/uL — ABNORMAL HIGH (ref 0.7–3.1)
Lymphs: 32 %
MCH: 31.5 pg (ref 26.6–33.0)
MCHC: 33.8 g/dL (ref 31.5–35.7)
MCV: 93 fL (ref 79–97)
Monocytes Absolute: 0.8 10*3/uL (ref 0.1–0.9)
Monocytes: 8 %
NRBC: 1 % — ABNORMAL HIGH (ref 0–0)
Neutrophils Absolute: 6 10*3/uL (ref 1.4–7.0)
Neutrophils: 55 %
Platelets: 455 10*3/uL — ABNORMAL HIGH (ref 150–450)
Potassium: 5 mmol/L (ref 3.5–5.2)
RBC: 3.4 x10E6/uL — ABNORMAL LOW (ref 3.77–5.28)
RDW: 16 % — ABNORMAL HIGH (ref 11.7–15.4)
Retic Ct Pct: 4 % — ABNORMAL HIGH (ref 0.6–2.6)
Sodium: 145 mmol/L — ABNORMAL HIGH (ref 134–144)
Total Protein: 7.1 g/dL (ref 6.0–8.5)
Vit D, 25-Hydroxy: 20.5 ng/mL — ABNORMAL LOW (ref 30.0–100.0)
WBC: 10.7 10*3/uL (ref 3.4–10.8)
eGFR: 75 mL/min/{1.73_m2} (ref >59)

## 2023-06-30 LAB — TOXASSURE FLEX 15, UR
Buprenorphine: 25 ng/mg{creat}
Creatinine: 63 mg/dL
Norbuprenorphine: 110 ng/mg{creat}

## 2023-06-30 LAB — CANNABINOIDS, MS, UR RFX
Cannabinoids Confirmation: POSITIVE
Carboxy-THC: 41 ng/mg{creat}

## 2023-08-05 ENCOUNTER — Ambulatory Visit (HOSPITAL_COMMUNITY)
Admission: EM | Admit: 2023-08-05 | Discharge: 2023-08-05 | Disposition: A | Discharge status: Home / Self Care | Payer: MEDICAID

## 2023-08-05 ENCOUNTER — Encounter (HOSPITAL_COMMUNITY): Payer: Self-pay | Admitting: Emergency Medicine

## 2023-08-05 DIAGNOSIS — H9312 Tinnitus, left ear: Secondary | ICD-10-CM

## 2023-08-05 NOTE — ED Triage Notes (Signed)
 Pt reports for past 4 days having ringing/buzzing in left ear. Denies pain.

## 2023-08-05 NOTE — Discharge Instructions (Addendum)
 Unfortunately tinnitus is very difficult to treat.  Please follow-up with ENT, call their office on Monday.  Seek follow-up care for any new or concerning symptoms.

## 2023-08-05 NOTE — ED Provider Notes (Signed)
 [facility]    CSN: [id number] Arrival date & time: 08/05/23  1712      History   Chief Complaint Chief Complaint  Patient presents with   Tinnitus    HPI Lindsey [name] is a 53 y.o. female.   Patient presents to clinic over concern of a ringing in her left ear that has been present for the past 4 days.  She is not having any pain.  Denies congestion.  Denies hearing loss in the ear.  Has not had any medication changes recently, she was started on Suboxone many months ago and has been taking this and off of her chronic pain medications for sickle cell disease.  The history is provided by the patient and medical records.    Past Medical History:  Diagnosis Date   Acute kidney injury (HCC) 01/18/2021   Acute pain of left shoulder    Cough 06/27/2020   Hypokalemia 06/27/2020   Hypoxia    Leukocytosis 09/03/2016   Opiate abuse, episodic (HCC) 09/25/2017   Opioid dependence in remission (HCC)    Right leg pain    Sickle cell crisis (HCC)    Vasoocclusive sickle cell crisis (HCC) 07/17/2021    Patient Active Problem List   Diagnosis Date Noted   Need for influenza vaccination 06/27/2023   Hb-SS disease without crisis (HCC) 06/27/2023   Insomnia 06/27/2023   Screening mammogram for breast cancer 06/27/2023   Sickle cell anemia with crisis (HCC) 05/03/2022   Acute low back pain 03/25/2022   GAD (generalized anxiety disorder) 03/25/2022   Sickle cell pain crisis (HCC) 02/11/2022   Acute sickle cell crisis (HCC) 01/28/2022   Anxiety and depression 12/29/2021   Anemia of chronic disease 10/29/2021   Sickle cell anemia (HCC) 09/10/2019   Sickle cell disease (HCC) 10/01/2017   Chronic pain 10/01/2017   Chronic narcotic use 09/25/2017    Past Surgical History:  Procedure Laterality Date   APPENDECTOMY     CESAREAN SECTION     OTHER SURGICAL HISTORY     c-section    OB History   No obstetric history on file.      Home Medications    Prior to  Admission medications   Medication Sig Start Date End Date Taking? Authorizing Provider  buprenorphine-naloxone (SUBOXONE) 2-0.5 mg SUBL SL tablet Place 2 tablets under the tongue daily. 04/12/23   Massie Maroon, FNP  carboxymethylcellulose (REFRESH PLUS) 0.5 % SOLN Place 1 drop into both eyes daily as needed (dry eyes).    [provider]  gabapentin (NEURONTIN) 400 MG capsule TAKE 1 CAPSULE(400 MG) BY MOUTH THREE TIMES DAILY 03/13/23   Massie Maroon, FNP  hydroxyurea (HYDREA) 500 MG capsule Take 1 capsule (500 mg total) by mouth daily. May take with food to minimize GI side effects. Patient not taking: Reported on 12/20/2022 06/14/22   Massie Maroon, FNP  ketorolac (TORADOL) 10 MG tablet Take 1 tablet (10 mg total) by mouth every 6 (six) hours as needed (pain). Patient not taking: Reported on 06/27/2023 03/31/23   Jannifer Franklin, MD  tiZANidine (ZANAFLEX) 4 MG tablet Take 1 tablet (4 mg total) by mouth every 8 (eight) hours as needed for muscle spasms. Patient not taking: Reported on 06/27/2023 03/31/23   Jannifer Franklin, MD  TYLENOL 8 HOUR ARTHRITIS PAIN 650 MG CR tablet Take 650 mg by mouth every 8 (eight) hours as needed for pain.    [provider]  Vitamin D, Ergocalciferol, (DRISDOL) 1.25 MG (  50000 UNIT) CAPS capsule Take 1 capsule (50,000 Units total) by mouth every 7 (seven) days. 06/28/23   Donell Beers, FNP    Family History Family History  Problem Relation Age of Onset   Stroke Neg Hx        none that she knows of    Seizures Neg Hx     Social History Social History   Tobacco Use   Smoking status: Former    Types: Cigars, Cigarettes   Smokeless tobacco: Never  Vaping Use   Vaping status: Former  Substance Use Topics   Alcohol use: Yes    Comment: occasionally   Drug use: Yes    Types: Marijuana     Allergies   Ketamine   Review of Systems Review of Systems  Per HPI  Physical Exam Triage Vital Signs ED Triage Vitals [08/05/23  1742]  Encounter Vitals Group     BP 119/75     Systolic BP Percentile      Diastolic BP Percentile      Pulse Rate 71     Resp 16     Temp 98.4 F (36.9 C)     Temp Source Oral     SpO2 97 %     Weight      Height      Head Circumference      Peak Flow      Pain Score 0     Pain Loc      Pain Education      Exclude from Growth Chart    No data found.  Updated Vital Signs BP 119/75 (BP Location: Left Arm)   Pulse 71   Temp 98.4 F (36.9 C) (Oral)   Resp 16   LMP 07/09/2021 (Approximate)   SpO2 97%   Visual Acuity Right Eye Distance:   Left Eye Distance:   Bilateral Distance:    Right Eye Near:   Left Eye Near:    Bilateral Near:     Physical Exam Vitals and nursing note reviewed.  Constitutional:      Appearance: Normal appearance.  HENT:     Head: Normocephalic and atraumatic.     Right Ear: Tympanic membrane, ear canal and external ear normal.     Left Ear: Tympanic membrane, ear canal and external ear normal.     Nose: Nose normal.     Mouth/Throat:     Mouth: Mucous membranes are moist.  Eyes:     Conjunctiva/sclera: Conjunctivae normal.  Cardiovascular:     Rate and Rhythm: Normal rate.  Pulmonary:     Effort: Pulmonary effort is normal. No respiratory distress.  Skin:    General: Skin is warm and dry.  Neurological:     General: No focal deficit present.     Mental Status: She is alert.  Psychiatric:        Mood and Affect: Mood normal.      UC Treatments / Results  Labs (all labs ordered are listed, but only abnormal results are displayed) Labs Reviewed - No data to display  EKG   Radiology No results found.  Procedures Procedures (including critical care time)  Medications Ordered in UC Medications - No data to display  Initial Impression / Assessment and Plan / UC Course  I have reviewed the triage vital signs and the nursing notes.  Pertinent labs & imaging results that were available during my care of the patient were  reviewed by me and considered in my  medical decision making (see chart for details).  Vitals and triage reviewed, patient is hemodynamically stable.  Bilateral ear canals are clear, tympanic membranes are pearly gray.  No clear cause to tendinitis.  She is no longer taking hydroxyurea, which has been known to occasionally cause tinnitus.  Encouraged ENT follow-up for further vesication of the symptoms.  Plan of care, follow-up care return precautions given, no questions at this time.     Final Clinical Impressions(s) / UC Diagnoses   Final diagnoses:  Tinnitus of left ear     Discharge Instructions      Unfortunately tinnitus is very difficult to treat.  Please follow-up with ENT, call their office on Monday.  Seek follow-up care for any new or concerning symptoms.    ED Prescriptions   None    PDMP not reviewed this encounter.   Sheronda Parran, [place], [place] 08/05/23 [phone]

## 2023-08-07 ENCOUNTER — Inpatient Hospital Stay (HOSPITAL_COMMUNITY)
Admission: EM | Admit: 2023-08-07 | Discharge: 2023-08-09 | DRG: 812 | Disposition: A | Discharge status: Home / Self Care | Payer: MEDICAID | Attending: Internal Medicine | Admitting: Internal Medicine

## 2023-08-07 ENCOUNTER — Other Ambulatory Visit: Payer: Self-pay

## 2023-08-07 ENCOUNTER — Encounter (HOSPITAL_COMMUNITY): Payer: Self-pay | Admitting: Emergency Medicine

## 2023-08-07 DIAGNOSIS — Z79899 Other long term (current) drug therapy: Secondary | ICD-10-CM | POA: Diagnosis not present

## 2023-08-07 DIAGNOSIS — H6692 Otitis media, unspecified, left ear: Secondary | ICD-10-CM | POA: Diagnosis present

## 2023-08-07 DIAGNOSIS — F419 Anxiety disorder, unspecified: Secondary | ICD-10-CM | POA: Diagnosis present

## 2023-08-07 DIAGNOSIS — Z884 Allergy status to anesthetic agent status: Secondary | ICD-10-CM | POA: Diagnosis not present

## 2023-08-07 DIAGNOSIS — G4700 Insomnia, unspecified: Secondary | ICD-10-CM | POA: Diagnosis present

## 2023-08-07 DIAGNOSIS — H9312 Tinnitus, left ear: Secondary | ICD-10-CM | POA: Diagnosis present

## 2023-08-07 DIAGNOSIS — D638 Anemia in other chronic diseases classified elsewhere: Secondary | ICD-10-CM | POA: Diagnosis present

## 2023-08-07 DIAGNOSIS — Z87891 Personal history of nicotine dependence: Secondary | ICD-10-CM | POA: Diagnosis not present

## 2023-08-07 DIAGNOSIS — D5700 Hb-SS disease with crisis, unspecified: Secondary | ICD-10-CM | POA: Diagnosis present

## 2023-08-07 DIAGNOSIS — Z9049 Acquired absence of other specified parts of digestive tract: Secondary | ICD-10-CM

## 2023-08-07 DIAGNOSIS — D72829 Elevated white blood cell count, unspecified: Secondary | ICD-10-CM | POA: Diagnosis present

## 2023-08-07 DIAGNOSIS — G894 Chronic pain syndrome: Secondary | ICD-10-CM | POA: Diagnosis present

## 2023-08-07 DIAGNOSIS — F1121 Opioid dependence, in remission: Secondary | ICD-10-CM | POA: Diagnosis present

## 2023-08-07 DIAGNOSIS — Z98891 History of uterine scar from previous surgery: Secondary | ICD-10-CM

## 2023-08-07 LAB — COMPREHENSIVE METABOLIC PANEL WITH GFR
ALT: 14 U/L (ref 0–44)
AST: 24 U/L (ref 15–41)
Albumin: 4.2 g/dL (ref 3.5–5.0)
Alkaline Phosphatase: 39 U/L (ref 38–126)
Anion gap: 7 (ref 5–15)
BUN: 14 mg/dL (ref 6–20)
CO2: 24 mmol/L (ref 22–32)
Calcium: 9.2 mg/dL (ref 8.9–10.3)
Chloride: 112 mmol/L — ABNORMAL HIGH (ref 98–111)
Creatinine, Ser: 1.01 mg/dL — ABNORMAL HIGH (ref 0.44–1.00)
GFR, Estimated: >60 mL/min (ref >60)
Glucose, Bld: 109 mg/dL — ABNORMAL HIGH (ref 70–99)
Potassium: 3.9 mmol/L (ref 3.5–5.1)
Sodium: 143 mmol/L (ref 135–145)
Total Bilirubin: 1.2 mg/dL (ref 0.0–1.2)
Total Protein: 7.4 g/dL (ref 6.5–8.1)

## 2023-08-07 LAB — CBC WITH DIFFERENTIAL/PLATELET
Abs Immature Granulocytes: 0.04 10*3/uL (ref 0.00–0.07)
Basophils Absolute: 0 10*3/uL (ref 0.0–0.1)
Basophils Relative: 0 %
Eosinophils Absolute: 0.3 10*3/uL (ref 0.0–0.5)
Eosinophils Relative: 2 %
HCT: 29.2 % — ABNORMAL LOW (ref 36.0–46.0)
Hemoglobin: 10.5 g/dL — ABNORMAL LOW (ref 12.0–15.0)
Immature Granulocytes: 0 %
Lymphocytes Relative: 24 %
Lymphs Abs: 2.9 10*3/uL (ref 0.7–4.0)
MCH: 31.1 pg (ref 26.0–34.0)
MCHC: 36 g/dL (ref 30.0–36.0)
MCV: 86.4 fL (ref 80.0–100.0)
Monocytes Absolute: 0.7 10*3/uL (ref 0.1–1.0)
Monocytes Relative: 6 %
Neutro Abs: 8.2 10*3/uL — ABNORMAL HIGH (ref 1.7–7.7)
Neutrophils Relative %: 68 %
Platelets: 414 10*3/uL — ABNORMAL HIGH (ref 150–400)
RBC: 3.38 MIL/uL — ABNORMAL LOW (ref 3.87–5.11)
RDW: 14.8 % (ref 11.5–15.5)
WBC: 12.2 10*3/uL — ABNORMAL HIGH (ref 4.0–10.5)
nRBC: 0.3 % — ABNORMAL HIGH (ref 0.0–0.2)

## 2023-08-07 LAB — RETICULOCYTES
Immature Retic Fract: 28.6 % — ABNORMAL HIGH (ref 2.3–15.9)
RBC.: 3.36 MIL/uL — ABNORMAL LOW (ref 3.87–5.11)
Retic Count, Absolute: 125.7 10*3/uL (ref 19.0–186.0)
Retic Ct Pct: 3.7 % — ABNORMAL HIGH (ref 0.4–3.1)

## 2023-08-07 MED ORDER — SODIUM CHLORIDE 0.45 % IV SOLN
INTRAVENOUS | Status: AC
Start: 2023-08-07 — End: 2023-08-07

## 2023-08-07 MED ORDER — KETOROLAC TROMETHAMINE 30 MG/ML IJ SOLN
30.0000 mg | Freq: Once | INTRAMUSCULAR | Status: AC
  Administered 2023-08-07: 30 mg via INTRAVENOUS
  Filled 2023-08-07: qty 1

## 2023-08-07 MED ORDER — AMOXICILLIN-POT CLAVULANATE 875-125 MG PO TABS
1.0000 | ORAL_TABLET | Freq: Two times a day (BID) | ORAL | Status: DC
  Administered 2023-08-07 – 2023-08-09 (×4): 1 via ORAL
  Filled 2023-08-07 (×4): qty 1

## 2023-08-07 MED ORDER — ONDANSETRON HCL 4 MG/2ML IJ SOLN
4.0000 mg | Freq: Once | INTRAMUSCULAR | Status: AC
  Administered 2023-08-07: 4 mg via INTRAVENOUS
  Filled 2023-08-07: qty 2

## 2023-08-07 MED ORDER — SENNOSIDES-DOCUSATE SODIUM 8.6-50 MG PO TABS
1.0000 | ORAL_TABLET | Freq: Two times a day (BID) | ORAL | Status: DC
  Administered 2023-08-07 – 2023-08-08 (×3): 1 via ORAL
  Filled 2023-08-07 (×4): qty 1

## 2023-08-07 MED ORDER — DIPHENHYDRAMINE HCL 25 MG PO CAPS
25.0000 mg | ORAL_CAPSULE | Freq: Once | ORAL | Status: AC
  Administered 2023-08-07: 25 mg via ORAL
  Filled 2023-08-07: qty 1

## 2023-08-07 MED ORDER — NALOXONE HCL 0.4 MG/ML IJ SOLN: 0.4000 mg | INTRAMUSCULAR | Status: DC | PRN

## 2023-08-07 MED ORDER — ENOXAPARIN SODIUM 40 MG/0.4ML IJ SOSY
40.0000 mg | PREFILLED_SYRINGE | INTRAMUSCULAR | Status: DC
  Administered 2023-08-07 – 2023-08-08 (×2): 40 mg via SUBCUTANEOUS
  Filled 2023-08-07 (×2): qty 0.4

## 2023-08-07 MED ORDER — MECLIZINE HCL 25 MG PO TABS
12.5000 mg | ORAL_TABLET | Freq: Two times a day (BID) | ORAL | Status: DC
  Administered 2023-08-07: 12.5 mg via ORAL
  Filled 2023-08-07: qty 1

## 2023-08-07 MED ORDER — HYDROMORPHONE HCL 1 MG/ML IJ SOLN: 1.0000 mg | INTRAMUSCULAR | Status: DC | PRN

## 2023-08-07 MED ORDER — POLYETHYLENE GLYCOL 3350 17 G PO PACK: 17.0000 g | PACK | Freq: Every day | ORAL | Status: DC | PRN

## 2023-08-07 MED ORDER — HYDROMORPHONE HCL 1 MG/ML IJ SOLN
1.0000 mg | Freq: Once | INTRAMUSCULAR | Status: AC
  Administered 2023-08-07: 1 mg via INTRAVENOUS
  Filled 2023-08-07: qty 1

## 2023-08-07 MED ORDER — SODIUM CHLORIDE 0.45 % IV SOLN: INTRAVENOUS | Status: DC

## 2023-08-07 MED ORDER — KETOROLAC TROMETHAMINE 15 MG/ML IJ SOLN
15.0000 mg | Freq: Four times a day (QID) | INTRAMUSCULAR | Status: DC
  Administered 2023-08-07 – 2023-08-09 (×8): 15 mg via INTRAVENOUS
  Filled 2023-08-07 (×8): qty 1

## 2023-08-07 MED ORDER — SODIUM CHLORIDE 0.9% FLUSH: 9.0000 mL | INTRAVENOUS | Status: DC | PRN

## 2023-08-07 MED ORDER — HYDROMORPHONE HCL 1 MG/ML IJ SOLN
0.5000 mg | Freq: Once | INTRAMUSCULAR | Status: AC
  Administered 2023-08-07: 0.5 mg via INTRAVENOUS
  Filled 2023-08-07: qty 1

## 2023-08-07 MED ORDER — ONDANSETRON HCL 4 MG/2ML IJ SOLN
4.0000 mg | Freq: Four times a day (QID) | INTRAMUSCULAR | Status: DC | PRN
  Administered 2023-08-08 – 2023-08-09 (×2): 4 mg via INTRAVENOUS
  Filled 2023-08-07 (×2): qty 2

## 2023-08-07 MED ORDER — HYDROMORPHONE 1 MG/ML IV SOLN
INTRAVENOUS | Status: DC
  Administered 2023-08-07 (×2): 1 mg via INTRAVENOUS
  Administered 2023-08-07: 30 mg via INTRAVENOUS
  Administered 2023-08-07: 1 mg via INTRAVENOUS
  Administered 2023-08-07 – 2023-08-08 (×2): 0.5 mg via INTRAVENOUS
  Administered 2023-08-08: 1 mg via INTRAVENOUS
  Administered 2023-08-08 (×2): 1.5 mg via INTRAVENOUS
  Administered 2023-08-08: 2 mg via INTRAVENOUS
  Administered 2023-08-08: 0.5 mg via INTRAVENOUS
  Administered 2023-08-08: 1 mg via INTRAVENOUS
  Administered 2023-08-09: 4 mg via INTRAVENOUS
  Administered 2023-08-09: 2 mg via INTRAVENOUS
  Filled 2023-08-07: qty 30

## 2023-08-07 MED ORDER — DIPHENHYDRAMINE HCL 25 MG PO CAPS: 25.0000 mg | ORAL_CAPSULE | ORAL | Status: DC | PRN

## 2023-08-07 NOTE — ED Triage Notes (Signed)
 Patient c/o bilateral leg pain secondary to [facility] that started yesterday.

## 2023-08-07 NOTE — Plan of Care (Signed)

## 2023-08-07 NOTE — ED Provider Notes (Signed)
 [place] [facility] AT [facility] Provider Note   CSN: [id number] Arrival date & time: 08/07/23  0143     History  Chief Complaint  Patient presents with   Sickle Cell Pain Crisis    Lindsey [name] is a 53 y.o. female.   Sickle Cell Pain Crisis  53 year old female with history of sickle cell anemia, anxiety, insomnia, presenting to the ED with sickle cell pain crisis.  She reports pain in her right leg that began yesterday and has been worsening since onset.  This is typical for her during crises.  She denies any injury, trauma, or fall.  She has been using over-the-counter ibuprofen, Aleve, and heating pad without much relief.  She chooses not to take controlled substances due to prior issues with addiction.  She is currently on Suboxone but is working on tapering off that as well.  She requested very limited doses of narcotics here.  Denies any chest pain, shortness of breath, cough, fever, or chills.  Home Medications Prior to Admission medications   Medication Sig Start Date End Date Taking? Authorizing Provider  buprenorphine-naloxone (SUBOXONE) 2-0.5 mg SUBL SL tablet Place 2 tablets under the tongue daily. 04/12/23   Massie Maroon, FNP  carboxymethylcellulose (REFRESH PLUS) 0.5 % SOLN Place 1 drop into both eyes daily as needed (dry eyes).    [provider]  gabapentin (NEURONTIN) 400 MG capsule TAKE 1 CAPSULE(400 MG) BY MOUTH THREE TIMES DAILY 03/13/23   Massie Maroon, FNP  hydroxyurea (HYDREA) 500 MG capsule Take 1 capsule (500 mg total) by mouth daily. May take with food to minimize GI side effects. Patient not taking: Reported on 12/20/2022 06/14/22   Massie Maroon, FNP  ketorolac (TORADOL) 10 MG tablet Take 1 tablet (10 mg total) by mouth every 6 (six) hours as needed (pain). Patient not taking: Reported on 06/27/2023 03/31/23   Jannifer Franklin, MD  tiZANidine (ZANAFLEX) 4 MG tablet Take 1 tablet (4 mg total) by mouth every 8  (eight) hours as needed for muscle spasms. Patient not taking: Reported on 06/27/2023 03/31/23   Jannifer Franklin, MD  TYLENOL 8 HOUR ARTHRITIS PAIN 650 MG CR tablet Take 650 mg by mouth every 8 (eight) hours as needed for pain.    [provider]  Vitamin D, Ergocalciferol, (DRISDOL) 1.25 MG (50000 UNIT) CAPS capsule Take 1 capsule (50,000 Units total) by mouth every 7 (seven) days. 06/28/23   Donell Beers, FNP      Allergies    Ketamine    Review of Systems   Review of Systems  Musculoskeletal:  Positive for arthralgias.  All other systems reviewed and are negative.   Physical Exam Updated Vital Signs BP (!) 138/51 (BP Location: Left Arm)   Pulse (!) 103   Temp 98.7 F (37.1 C) (Oral)   Wt 70 kg   LMP 07/09/2021 (Approximate)   SpO2 98%   BMI 30.14 kg/m   Physical Exam Vitals and nursing note reviewed.  Constitutional:      Appearance: She is well-developed.     Comments: Tearful, appears uncomfortable  HENT:     Head: Normocephalic and atraumatic.  Eyes:     Conjunctiva/sclera: Conjunctivae normal.     Pupils: Pupils are equal, round, and reactive to light.  Cardiovascular:     Rate and Rhythm: Normal rate and regular rhythm.     Heart sounds: Normal heart sounds.  Pulmonary:     Effort: Pulmonary effort is normal.  Breath sounds: Normal breath sounds.  Musculoskeletal:        General: Normal range of motion.     Cervical back: Normal range of motion.     Comments: Right leg wrapped in heating pad, no significant swelling, no bony deformities  Skin:    General: Skin is warm and dry.  Neurological:     Mental Status: She is alert and oriented to person, place, and time.     ED Results / Procedures / Treatments   Labs (all labs ordered are listed, but only abnormal results are displayed) Labs Reviewed  CBC WITH DIFFERENTIAL/PLATELET - Abnormal; Notable for the following components:      Result Value   WBC 12.2 (*)    RBC 3.38 (*)     Hemoglobin 10.5 (*)    HCT 29.2 (*)    Platelets 414 (*)    nRBC 0.3 (*)    Neutro Abs 8.2 (*)    All other components within normal limits  COMPREHENSIVE METABOLIC PANEL WITH GFR - Abnormal; Notable for the following components:   Chloride 112 (*)    Glucose, Bld 109 (*)    Creatinine, Ser 1.01 (*)    All other components within normal limits  RETICULOCYTES - Abnormal; Notable for the following components:   Retic Ct Pct 3.7 (*)    RBC. 3.36 (*)    Immature Retic Fract 28.6 (*)    All other components within normal limits    EKG None  Radiology No results found.  Procedures Procedures    CRITICAL CARE Performed by: Garlon Hatchet   Total critical care time: 45 minutes  Critical care time was exclusive of separately billable procedures and treating other patients.  Critical care was necessary to treat or prevent imminent or life-threatening deterioration.  Critical care was time spent personally by me on the following activities: development of treatment plan with patient and/or surrogate as well as nursing, discussions with consultants, evaluation of patient's response to treatment, examination of patient, obtaining history from patient or surrogate, ordering and performing treatments and interventions, ordering and review of laboratory studies, ordering and review of radiographic studies, pulse oximetry and re-evaluation of patient's condition.   Medications Ordered in ED Medications  0.45 % sodium chloride infusion ( Intravenous New Bag/Given 08/07/23 0230)  HYDROmorphone (DILAUDID) injection 1 mg (has no administration in time range)  diphenhydrAMINE (BENADRYL) capsule 25 mg (has no administration in time range)  ondansetron (ZOFRAN) injection 4 mg (has no administration in time range)  HYDROmorphone (DILAUDID) injection 0.5 mg (0.5 mg Intravenous Given 08/07/23 0229)  ondansetron (ZOFRAN) injection 4 mg (4 mg Intravenous Given 08/07/23 0228)  HYDROmorphone  (DILAUDID) injection 1 mg (1 mg Intravenous Given 08/07/23 0338)  ketorolac (TORADOL) 30 MG/ML injection 30 mg (30 mg Intravenous Given 08/07/23 1610)    ED Course/ Medical Decision Making/ A&P                                 Medical Decision Making Amount and/or Complexity of Data Reviewed Labs: ordered. ECG/medicine tests: ordered and independent interpretation performed.  Risk Prescription drug management. Decision regarding hospitalization.   53 year old female presenting to the ED with sickle cell pain crisis.  Began yesterday.  Pain in the right leg which is common during crises.  She is tearful on exam and appears uncomfortable.  She has a heating pad on the right leg but I do not  appreciate any significant swelling or bony deformities.  Labs will be sent.  She is currently in recovery following prior history of narcotic abuse, will start with very low doses.  She is also given IV fluids.  Labs as above--mild leukocytosis but stable hemoglobin at 10.5.  No electrolyte derangement.  After initial meds she has not had any meaningful improvement.  Increased Dilaudid dose to 1 mg, added Toradol.  Will reassess.  4:44 AM Patient without any significant improvement of her pain.  She is still quite tearful and appears to be in significant pain.  She is concerned about taking continued narcotics given her history of opiate abuse.  I have made her aware that we are aiming for lowest therapeutic dosages but pain currently is still not controlled adequately.  She is agreeable to continued low dose medications as long as strictly monitored.  At this point, she will require admission for pain control.  She is agreeable.  Discussed with Dr. Antionette Char-- will admit.  He is aware of her request regarding cautious narcotic dosing.  Final Clinical Impression(s) / ED Diagnoses Final diagnoses:  Sickle cell pain crisis [facility])    Rx / DC Orders ED Discharge Orders     None         Garlon Hatchet,  PA-C 08/07/23 0517    Tilden Fossa, MD 08/07/23 [phone]

## 2023-08-07 NOTE — H&P (Signed)
 History and Physical    Zully Frane YNW:[id number] DOB: 12/24/1970 DOA: 08/07/2023  PCP: Donell Beers, FNP   Patient coming from: Home   Chief Complaint: Atraumatic leg pain   HPI: Hetal Proano is a 53 y.o. female with medical history significant for sickle cell anemia, chronic pain, and insomnia who presents with atraumatic pain in bilateral legs similar to prior sickle cell pain crises.   Patient reports that she developed pain in the bilateral legs yesterday.  It has been severe despite home medications.  She denies any chest pain, cough, shortness of breath, fever, chills, or abdominal pain.  She denies urinary symptoms.  ED Course: Upon arrival to the ED, patient is found to be afebrile and saturating well on room air with mild tachycardia and stable BP. Labs are notable for SCr 1.01, WBC 12,200, Hgb 10.5, and platelets 414,000.   She was treated in the ED with IVF, Toradol, and multiple doses of IV Dilaudid.   Review of Systems:  All other systems reviewed and apart from HPI, are negative.  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 Her smoking use included cigars and cigarettes. She has never used smokeless tobacco. She reports current alcohol use. She reports current drug use. Drug: Marijuana.  Allergies  Allergen Reactions   Ketamine Anxiety and Other (See Comments)    Tachycardia    Family History  Problem Relation Age of Onset    Stroke Neg Hx        none that she knows of    Seizures Neg Hx      Prior to Admission medications   Medication Sig Start Date End Date Taking? Authorizing Provider  buprenorphine-naloxone (SUBOXONE) 2-0.5 mg SUBL SL tablet Place 2 tablets under the tongue daily. 04/12/23   Massie Maroon, FNP  carboxymethylcellulose (REFRESH PLUS) 0.5 % SOLN Place 1 drop into both eyes daily as needed (dry eyes).    [provider]  gabapentin (NEURONTIN) 400 MG capsule TAKE 1 CAPSULE(400 MG) BY MOUTH THREE TIMES DAILY 03/13/23   Massie Maroon, FNP  hydroxyurea (HYDREA) 500 MG capsule Take 1 capsule (500 mg total) by mouth daily. May take with food to minimize GI side effects. Patient not taking: Reported on 12/20/2022 06/14/22   Massie Maroon, FNP  ketorolac (TORADOL) 10 MG tablet Take 1 tablet (10 mg total) by mouth every 6 (six) hours as needed (pain). Patient not taking: Reported on 06/27/2023 03/31/23   Jannifer Franklin, MD  tiZANidine (ZANAFLEX) 4 MG tablet Take 1 tablet (4 mg total) by mouth every 8 (eight) hours as needed for muscle spasms. Patient not taking: Reported on 06/27/2023 03/31/23   Jannifer Franklin, MD  TYLENOL 8 HOUR ARTHRITIS PAIN 650 MG CR tablet Take 650 mg by mouth every 8 (eight) hours as needed for pain.    [provider]  Vitamin D, Ergocalciferol, (DRISDOL) 1.25 MG (50000 UNIT) CAPS capsule Take 1 capsule (50,000 Units total) by mouth every 7 (seven) days. 06/28/23   Donell Beers, FNP    Physical Exam: Vitals:   08/07/23 0150 08/07/23 0455 08/07/23 0549  BP: (!) 138/51    Pulse: (!) 103    Resp:  14 14  Temp: 98.7 F (37.1 C)    TempSrc: Oral    SpO2: 98%    Weight: 70 kg       Constitutional: NAD, no pallor or diaphoresis   Eyes: PERTLA, lids and conjunctivae normal ENMT: Mucous membranes are moist. Posterior pharynx clear of any exudate or lesions.   Neck: supple, no masses  Respiratory: no wheezing, no crackles. No accessory muscle use.   Cardiovascular: S1 & S2 heard, regular rate and rhythm. No extremity edema.  Abdomen: No distension, no tenderness, soft. Bowel sounds active.  Musculoskeletal: no clubbing / cyanosis. No joint deformity upper and lower extremities.   Skin: no significant rashes, lesions, ulcers. Warm, dry, well-perfused. Neurologic: CN 2-12 grossly intact. Moving all extremities. Alert and oriented.  Psychiatric: Calm. Cooperative.    Labs and Imaging on Admission: I have personally reviewed following labs and imaging studies  CBC: Recent Labs  Lab 08/07/23 0242  WBC 12.2*  NEUTROABS 8.2*  HGB 10.5*  HCT 29.2*  MCV 86.4  PLT 414*   Basic Metabolic Panel: Recent Labs  Lab 08/07/23 0242  NA 143  K 3.9  CL 112*  CO2 24  GLUCOSE 109*  BUN 14  CREATININE 1.01*  CALCIUM 9.2   GFR: Estimated Creatinine Clearance: 57.5 mL/min (A) (by C-G formula based on SCr of 1.01 mg/dL (H)). Liver Function Tests: Recent Labs  Lab 08/07/23 0242  AST 24  ALT 14  ALKPHOS 39  BILITOT 1.2  PROT 7.4  ALBUMIN 4.2   No results for input(s): "LIPASE", "AMYLASE" in the last 168 hours. No results for input(s): "AMMONIA" in the last 168 hours. Coagulation Profile: No results for input(s): "INR", "PROTIME" in the last 168 hours. Cardiac Enzymes: No results for input(s): "CKTOTAL", "CKMB", "CKMBINDEX", "TROPONINI" in the last 168 hours. BNP (last 3 results) No results for input(s): "PROBNP" in the last 8760 hours. HbA1C: No results for input(s): "HGBA1C" in the last 72 hours. CBG: No results for input(s): "GLUCAP" in the last 168 hours. Lipid Profile: No results for input(s): "CHOL", "HDL", "LDLCALC", "TRIG", "CHOLHDL", "LDLDIRECT" in the last 72 hours. Thyroid Function Tests: No results for input(s): "TSH", "T4TOTAL", "FREET4", "T3FREE", "THYROIDAB" in the last 72 hours. Anemia Panel: Recent Labs    08/07/23 0242  RETICCTPCT 3.7*   Urine analysis:    Component Value Date/Time   COLORURINE  YELLOW 05/31/2021 1350   APPEARANCEUR TURBID (A) 05/31/2021 1350   LABSPEC 1.012 05/31/2021 1350   PHURINE 5.0 05/31/2021 1350   GLUCOSEU NEGATIVE 05/31/2021 1350   HGBUR SMALL (A) 05/31/2021 1350   BILIRUBINUR NEGATIVE 05/31/2021 1350   BILIRUBINUR neg 04/07/2020 1014   KETONESUR NEGATIVE 05/31/2021 1350   PROTEINUR 100 (A) 05/31/2021 1350   UROBILINOGEN 0.2 04/07/2020 1014   UROBILINOGEN 1.0 12/25/2012 1755   NITRITE NEGATIVE 05/31/2021 1350   LEUKOCYTESUR MODERATE (A) 05/31/2021 1350   Sepsis Labs: @LABRCNTIP (procalcitonin:4,lacticidven:4) )No results found for this or any previous visit (from the past 240 hours).   Radiological Exams on Admission: No results found.  Assessment/Plan   1. Sickle cell anemia with pain crisis  - Continue IVF hydration with 0.45% NaCl, continue  Toradol q6h, start Dilaudid PCA   8. Leukocytosis  - No fever or evidence for infection, likely reactive    DVT prophylaxis: Lovenox  Code Status: Full  Level of Care: Level of care: Med-Surg Family Communication: None present   Disposition Plan:  Patient is from: Home  Anticipated d/c is to: Home  Anticipated d/c date is: 08/09/23  Patient currently: Pending pain-control  Consults called: None  Admission status: Inpatient     Briscoe Deutscher, MD [facility]  08/07/2023, 5:53 AM

## 2023-08-08 DIAGNOSIS — H6692 Otitis media, unspecified, left ear: Secondary | ICD-10-CM | POA: Diagnosis present

## 2023-08-08 LAB — HIV ANTIBODY (ROUTINE TESTING W REFLEX): HIV Screen 4th Generation wRfx: NONREACTIVE

## 2023-08-08 NOTE — Progress Notes (Signed)
 Patient ID: Lindsey [name], female   DOB: 1970/11/27, 53 y.o.   MRN: [id number] Subjective: Lindsey [name] is a 53 year old  female with a medical history significant for sickle cell disease,chronic pain syndrome,opiate dependence and tolerance, and history of anemia of chronic disease presents with complaints of bilateral lower extremity pain over the past 24 hours. Pain unrelieved by patient's home medications. She rates pain as 10/10, constant, and throbbing. Denies fever, chills, chest pain, headache. No nausea, vomiting, or diarrhea. No recent travel or sick contacts.  Patient reports ongoing ringing in the left ear for the past 4 days. She rates bilateral lower pain today as 2/10  Objective:  Vital signs in last 24 hours:  Vitals:   08/08/23 0339 08/08/23 0610 08/08/23 0716 08/08/23 1047  BP:  118/72  (!) 110/52  Pulse:  (!) 56  77  Resp: 11 18 12 15   Temp:  98.2 F (36.8 C)  98.4 F (36.9 C)  TempSrc:  Oral  Oral  SpO2:  96%  99%  Weight:      Height:        Intake/Output from previous day:   Intake/Output Summary (Last 24 hours) at 08/08/2023 1218 Last data filed at 08/07/2023 1600 Gross per 24 hour  Intake 1026.03 ml  Output --  Net 1026.03 ml    Physical Exam: General: Alert, awake, oriented x3, in no acute distress.  HEENT: [place]/AT [place], EOMI Neck: Trachea midline,  no masses, no thyromegal,y no JVD, no carotid bruit OROPHARYNX:  Moist, No exudate/ erythema/lesions.  Heart: Regular rate and rhythm, without murmurs, rubs, gallops, PMI non-displaced, no heaves or thrills on palpation.  Lungs: Clear to auscultation, no wheezing or rhonchi noted. No increased vocal fremitus resonant to percussion  Abdomen: Soft, nontender, nondistended, positive bowel sounds, no masses no hepatosplenomegaly noted..  Neuro: No focal neurological deficits noted cranial nerves II through XII grossly intact. DTRs 2+ bilaterally upper and lower extremities. Strength 5 out of 5 in  bilateral upper and lower extremities. Musculoskeletal: Bilateral lower extremity pain Psychiatric: Patient alert and oriented x3, good insight and cognition, good recent to remote recall. Lymph node survey: No cervical axillary or inguinal lymphadenopathy noted.  Lab Results:  Basic Metabolic Panel:    Component Value Date/Time   NA 143 08/07/2023 0242   NA 145 (H) 06/27/2023 1206   K 3.9 08/07/2023 0242   CL 112 (H) 08/07/2023 0242   CO2 24 08/07/2023 0242   BUN 14 08/07/2023 0242   BUN 14 06/27/2023 1206   CREATININE 1.01 (H) 08/07/2023 0242   GLUCOSE 109 (H) 08/07/2023 0242   CALCIUM 9.2 08/07/2023 0242   CBC:    Component Value Date/Time   WBC 12.2 (H) 08/07/2023 0242   HGB 10.5 (L) 08/07/2023 0242   HGB 10.7 (L) 06/27/2023 1206   HCT 29.2 (L) 08/07/2023 0242   HCT 31.7 (L) 06/27/2023 1206   PLT 414 (H) 08/07/2023 0242   PLT 455 (H) 06/27/2023 1206   MCV 86.4 08/07/2023 0242   MCV 93 06/27/2023 1206   NEUTROABS 8.2 (H) 08/07/2023 0242   NEUTROABS 6.0 06/27/2023 1206   LYMPHSABS 2.9 08/07/2023 0242   LYMPHSABS 3.4 (H) 06/27/2023 1206   MONOABS 0.7 08/07/2023 0242   EOSABS 0.3 08/07/2023 0242   EOSABS 0.4 06/27/2023 1206   BASOSABS 0.0 08/07/2023 0242   BASOSABS 0.1 06/27/2023 1206    No results found for this or any previous visit (from the past 240 hours).  Studies/Results: No  results found.  Medications: Scheduled Meds:  amoxicillin-clavulanate  1 tablet Oral Q12H   enoxaparin (LOVENOX) injection  40 mg Subcutaneous Q24H   HYDROmorphone   Intravenous Q4H   ketorolac  15 mg Intravenous Q6H   senna-docusate  1 tablet Oral BID   Continuous Infusions: PRN Meds:.diphenhydrAMINE, naloxone **AND** sodium chloride flush, ondansetron (ZOFRAN) IV, polyethylene glycol  Consultants: None  Procedures: None  Antibiotics: Augmentin   Assessment/Plan: Active Problems:   Leukocytosis   Sickle cell pain crisis (HCC)   Otitis media of left ear   Hb  Sickle Cell Disease with Pain crisis: Continue IVF 0.45% Saline @ KVO. continue weight based Dilaudid PCA, IV Toradol 15 mg Q 6 H for a total of 5 days, continue oral home pain medications as ordered. Monitor vitals very closely, Re-evaluate pain scale regularly, 2 L of Oxygen by [place]. Patient encouraged to ambulate on the hallway today.  Leukocytosis: Slightly elevated no s/s of acute infection. Will continue to monitor.  Anemia of Chronic Disease: HGB stable and within patients base line. No need for transfusion. Will continue to monitor  Chronic pain Syndrome: Continue home medication Otitis media: Augmentin 875 mg 1 tab 2x/day for 5 days  Code Status: Full Code Family Communication: N/A Disposition Plan: Not yet ready for discharge  Daryll Drown NP  If 7PM-7AM, please contact night-coverage.  08/08/2023, 12:18 PM  LOS: 1 day

## 2023-08-08 NOTE — Plan of Care (Signed)

## 2023-08-09 ENCOUNTER — Other Ambulatory Visit: Payer: Self-pay | Admitting: Nurse Practitioner

## 2023-08-09 DIAGNOSIS — H6692 Otitis media, unspecified, left ear: Secondary | ICD-10-CM

## 2023-08-09 MED ORDER — AMOXICILLIN-POT CLAVULANATE 875-125 MG PO TABS: 1.0000 | ORAL_TABLET | Freq: Two times a day (BID) | ORAL | 0 refills | Status: DC

## 2023-08-09 NOTE — Progress Notes (Signed)
 Augmentin 3 days dose sent to pharmacy

## 2023-08-09 NOTE — Discharge Summary (Signed)
 Physician Discharge Summary  Lindsey [name] WGN:[id number] DOB: May 29, 1970 DOA: 08/07/2023  PCP: Donell Beers, FNP  Admit date: 08/07/2023  Discharge date: 08/09/2023  Discharge Diagnoses:  Active Problems:   Leukocytosis   Sickle cell pain crisis (HCC)   Otitis media of left ear   Discharge Condition: Stable  Disposition:  Pt is discharged home in good condition and is to follow up with Donell Beers, FNP this week to have labs evaluated. Lindsey [name] is instructed to increase activity slowly and balance with rest for the next few days, and use prescribed medication to complete treatment of pain  Diet: Regular Wt Readings from Last 3 Encounters:  08/07/23 70.2 kg  06/27/23 68.9 kg  04/02/23 67.1 kg    History of present illness:  Lindsey [name] is a 53 year old  female with a medical history significant for sickle cell disease,chronic pain syndrome,opiate dependence and tolerance, and history of anemia of chronic disease presents with complaints lower extremity pain over the past several days. Pain unrelieved by patient's home medications. She rates pain as 10/10, constant, and throbbing. Denies fever, chills, chest pain, headache. No nausea, vomiting, or diarrhea. No recent travel or sick contacts.   ED Course:  BP (!) 138/51 (BP Location: Left Arm)   Pulse (!) 103   Temp 98.7 F (37.1 C) (Oral)   Wt 70 kg   LMP 07/09/2021 (Approximate)   SpO2 98%   BMI 30.14 kg/m  Patients pain was not well managed in ED after several rounds of IV pain medication, patient was admitted in-patient for further  pain management    Labs Reviewed  CBC WITH DIFFERENTIAL/PLATELET - Abnormal; Notable for the following components:      Result Value   WBC 12.2 (*)    RBC 3.38 (*)    Hemoglobin 10.5 (*)    HCT 29.2 (*)    Platelets 414 (*)    nRBC 0.3 (*)    Neutro Abs 8.2 (*)    All other components within normal limits  COMPREHENSIVE METABOLIC PANEL WITH GFR -  Abnormal; Notable for the following components:   Chloride 112 (*)    Glucose, Bld 109 (*)    Creatinine, Ser 1.01 (*)    All other components within normal limits  RETICULOCYTES - Abnormal; Notable for the following components:   Retic Ct Pct 3.7 (*)    RBC. 3.36 (*)    Immature Retic Fract 28.6 (*)    All other components within normal limits  HIV ANTIBODY (ROUTINE TESTING W REFLEX)     Hospital Course:  Patient was admitted for sickle cell pain crisis and managed appropriately with IVF, IV Dilaudid via PCA and IV Toradol, as well as other adjunct therapies per sickle cell pain management protocols. Patient's pain is at base line this morning, she rates pain 0/10 from  10/10. Patient however continues to report tinnitus in her left ear. Encouraged to complete antibiotic treatment and keep follow up appointment in two days.  Patient was therefore discharged home today in a hemodynamically stable condition.   Lindsey [name] will follow-up with PCP within 1 week of this discharge. Lindsey [name] was counseled extensively about nonpharmacologic means of pain management, patient verbalized understanding and was appreciative of  the care received during this admission.   We discussed the need for good hydration, monitoring of hydration status, avoidance of heat, cold, stress, and infection triggers. We discussed the need to be adherent with taking Hydrea and other home medications.  Patient was reminded of the need to seek medical attention immediately if any symptom of bleeding, anemia, or infection occurs.  Discharge Exam: Vitals:   08/09/23 0730 08/09/23 0937  BP:  109/68  Pulse:  69  Resp: 16 20  Temp:  98.3 F (36.8 C)  SpO2:  99%   Vitals:   08/09/23 0354 08/09/23 0509 08/09/23 0730 08/09/23 0937  BP:  107/60  109/68  Pulse:  68  69  Resp: 12 13 16 20   Temp:  99.2 F (37.3 C)  98.3 F (36.8 C)  TempSrc:  Oral  Oral  SpO2:  100%  99%  Weight:      Height:        General appearance  : Awake, alert, not in any distress. Speech Clear. Not toxic looking HEENT: Atraumatic and Normocephalic, pupils equally reactive to light and accomodation Neck: Supple, no JVD. No cervical lymphadenopathy.  Chest: Good air entry bilaterally, no added sounds  CVS: S1 S2 regular, no murmurs.  Abdomen: Bowel sounds present, Non tender and not distended with no gaurding, rigidity or rebound. Extremities: B/L Lower Ext shows no edema, both legs are warm to touch Neurology: Awake alert, and oriented X 3, CN II-XII intact, Non focal Skin: No Rash  Discharge Instructions  Discharge Instructions     Diet - low sodium heart healthy   Complete by: As directed    Increase activity slowly   Complete by: As directed         The results of significant diagnostics from this hospitalization (including imaging, microbiology, ancillary and laboratory) are listed below for reference.    Significant Diagnostic Studies: No results found.  Microbiology: No results found for this or any previous visit (from the past 240 hours).   Labs: Basic Metabolic Panel: Recent Labs  Lab 08/07/23 0242  NA 143  K 3.9  CL 112*  CO2 24  GLUCOSE 109*  BUN 14  CREATININE 1.01*  CALCIUM 9.2   Liver Function Tests: Recent Labs  Lab 08/07/23 0242  AST 24  ALT 14  ALKPHOS 39  BILITOT 1.2  PROT 7.4  ALBUMIN 4.2   No results for input(s): "LIPASE", "AMYLASE" in the last 168 hours. No results for input(s): "AMMONIA" in the last 168 hours. CBC: Recent Labs  Lab 08/07/23 0242  WBC 12.2*  NEUTROABS 8.2*  HGB 10.5*  HCT 29.2*  MCV 86.4  PLT 414*   Cardiac Enzymes: No results for input(s): "CKTOTAL", "CKMB", "CKMBINDEX", "TROPONINI" in the last 168 hours. BNP: Invalid input(s): "POCBNP" CBG: No results for input(s): "GLUCAP" in the last 168 hours.  Time coordinating discharge: 50 minutes  Signed:  Daryll Drown NP  [facility] 08/09/2023, 2:26 PM

## 2023-08-10 ENCOUNTER — Telehealth: Payer: Self-pay

## 2023-08-10 NOTE — Transitions of Care (Post Inpatient/ED Visit) (Signed)
 08/10/2023  Name: Lindsey [name] MRN: [id number] DOB: 1970/05/28  Today's TOC FU Call Status: Today's TOC FU Call Status:: Successful TOC FU Call Completed TOC FU Call Complete Date: 08/10/23 Patient's Name and Date of Birth confirmed.  Transition Care Management Follow-up Telephone Call Date of Discharge: 08/09/23 Discharge Facility: [facility]) Type of Discharge: Inpatient Admission Primary Inpatient Discharge Diagnosis:: Sickle Cell pain crisis How have you been since you were released from the hospital?: Better Any questions or concerns?: No  Items Reviewed: Did you receive and understand the discharge instructions provided?: Yes Medications obtained,verified, and reconciled?: Yes (Medications Reviewed) Any new allergies since your discharge?: No Dietary orders reviewed?: Yes Type of Diet Ordered:: to continue a low sodium, heart healthy diet Do you have support at home?: Yes Name of Support/Comfort Primary Source: has network of supportive children, family, and friends  Medications Reviewed Today: Medications Reviewed Today     Reviewed by Marcos Eke, RN (Registered Nurse) on 08/10/23 at 1334  Med List Status: <None>   Medication Order Taking? Sig Documenting Provider Last Dose Status Informant  amoxicillin-clavulanate (AUGMENTIN) 875-125 MG tablet [id number]  Take 1 tablet by mouth 2 (two) times daily. Daryll Drown, NP  Active   buprenorphine-naloxone (SUBOXONE) 2-0.5 mg SUBL SL tablet [id number] No Place 2 tablets under the tongue daily.  Patient taking differently: Place 1 tablet under the tongue daily.   Massie Maroon, FNP Past Week Active Self, Pharmacy Records           Med Note Sedonia Small Aug 07, 2023 11:12 AM) Confirmed just one tablet once daily.   carboxymethylcellulose (REFRESH PLUS) 0.5 % SOLN [id number] No Place 1 drop into both eyes daily as needed (dry eyes). [provider] Unknown Active Self, Pharmacy Records   diphenhydrAMINE (BENADRYL) 25 MG tablet [id number] No Take 50 mg by mouth every 6 (six) hours as needed for itching or sleep. [provider] 08/06/2023 Active Self, Pharmacy Records  gabapentin (NEURONTIN) 400 MG capsule [id number] No TAKE 1 CAPSULE(400 MG) BY MOUTH THREE TIMES DAILY Massie Maroon, FNP 08/06/2023 Active Self, Pharmacy Records  ibuprofen (ADVIL) 200 MG tablet [id number] No Take 400 mg by mouth every 6 (six) hours as needed for moderate pain (pain score 4-6). [provider] Past Week Active Self, Pharmacy Records  ketorolac (TORADOL) 10 MG tablet [id number] No Take 1 tablet (10 mg total) by mouth every 6 (six) hours as needed (pain). Jannifer Franklin, MD Past Month Active Self, Pharmacy Records  TYLENOL 8 HOUR ARTHRITIS PAIN 650 MG CR tablet [id number] No Take 650 mg by mouth every 8 (eight) hours as needed for pain. [provider] Past Month Active Self, Pharmacy Records           Med Note (CRUTHIS, Carlota Raspberry Jul 04, 2022  5:25 PM)    Vitamin D, Ergocalciferol, (DRISDOL) 1.25 MG (50000 UNIT) CAPS capsule [id number] No Take 1 capsule (50,000 Units total) by mouth every 7 (seven) days. Donell Beers, FNP Past Week Active Self, Pharmacy Records            Home Care and Equipment/Supplies: Were Home Health Services Ordered?: NA Any new equipment or medical supplies ordered?: NA  Functional Questionnaire: Do you need assistance with bathing/showering or dressing?: No Do you need assistance with meal preparation?: No Do you need assistance with eating?: No Do you have difficulty maintaining continence: No Do you need assistance with getting out  of bed/getting out of a chair/moving?: No Do you have difficulty managing or taking your medications?: No  Follow up appointments reviewed: PCP Follow-up appointment confirmed?: Yes Date of PCP follow-up appointment?: 09/25/23 (earliest available, wants to see this practitioner) Follow-up Provider:  FNP, Edwin Dada Specialist [facility] Follow-up appointment confirmed?: Yes Date of Specialist follow-up appointment?: 08/11/23 Follow-Up Specialty Provider:: patient is suffering from a recent onset of persistent tinnitus - seeing ENT; also has overnight sleep study on 4/9 scheduled Do you need transportation to your follow-up appointment?: No Do you understand care options if your condition(s) worsen?: Yes-patient verbalized understanding   Kaitrin Seybold A. Mliss Fritz RN, BA, [facility], CRRN [facility] RN Care Manager, Transition of Care [phone]

## 2023-08-11 ENCOUNTER — Encounter (INDEPENDENT_AMBULATORY_CARE_PROVIDER_SITE_OTHER): Payer: Self-pay

## 2023-08-11 ENCOUNTER — Ambulatory Visit (INDEPENDENT_AMBULATORY_CARE_PROVIDER_SITE_OTHER): Payer: MEDICAID | Admitting: Physician Assistant

## 2023-08-11 ENCOUNTER — Ambulatory Visit (INDEPENDENT_AMBULATORY_CARE_PROVIDER_SITE_OTHER): Payer: MEDICAID | Admitting: Audiology

## 2023-08-11 VITALS — BP 127/83 | HR 78 | Ht 60.0 in | Wt 152.0 lb

## 2023-08-11 DIAGNOSIS — H903 Sensorineural hearing loss, bilateral: Secondary | ICD-10-CM

## 2023-08-11 DIAGNOSIS — H9312 Tinnitus, left ear: Secondary | ICD-10-CM

## 2023-08-11 DIAGNOSIS — H9319 Tinnitus, unspecified ear: Secondary | ICD-10-CM | POA: Diagnosis not present

## 2023-08-11 NOTE — Progress Notes (Signed)
 Dear Dr. Geoffery Spruce, Here is my assessment for our mutual patient, Lindsey [name]. Thank you for allowing me the opportunity to care for your patient. Please do not hesitate to contact me should you have any other questions. Sincerely, Burna Forts PA-C  Otolaryngology Clinic Note Referring provider: Dr. Geoffery Spruce HPI:  Lindsey [name] is a 53 y.o. female kindly referred by Dr. Geoffery Spruce   The patient is a 53 year old female seen in our office for evaluation of left-sided tinnitus.  The patient notes that she developed left-sided tinnitus on 08/05/2023.  She describes this is high-pitched and consistent, no pulsatile nature.  She followed up in urgent care was told that she had redness and otitis media.  She notes some associated minor nasal congestion, no fever.  She was placed on antibiotics.  She notes that after starting the antibiotics the upper respiratory symptoms resolved as well as the tinnitus.  She denies any ringing today, she denies any neurologic complaints.  She reports her hearing is at its baseline and does not perceive any hearing deficits.  She notes a history of some minimal seasonal allergies.      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 (Patient taking differently: Place 1 tablet under the tongue daily.), Disp: 60 tablet, Rfl: 0   carboxymethylcellulose (REFRESH PLUS) 0.5 % SOLN, Place 1 drop into both eyes daily as needed (dry eyes)., Disp: , Rfl:    diphenhydrAMINE (BENADRYL) 25 MG tablet, Take 50 mg by mouth every 6 (six) hours as needed for itching or sleep., Disp: , Rfl:    gabapentin (NEURONTIN) 400 MG capsule, TAKE 1 CAPSULE(400 MG) BY MOUTH THREE TIMES DAILY, Disp: 90 capsule, Rfl: 5   ibuprofen (ADVIL) 200 MG tablet, Take 400 mg by mouth every 6 (six) hours as needed for moderate pain (pain score 4-6)., Disp: , Rfl:    ketorolac (TORADOL) 10 MG tablet, Take 1 tablet (10 mg total) by mouth every 6 (six) hours as needed (pain)., Disp: 20 tablet, Rfl: 0   TYLENOL 8 HOUR ARTHRITIS PAIN 650 MG CR tablet, Take 650 mg by mouth every 8 (eight) hours as needed for pain., Disp: , Rfl:    Vitamin D, Ergocalciferol, (DRISDOL) 1.25 MG (50000 UNIT) CAPS capsule, Take 1 capsule (50,000 Units total) by  mouth every 7 (seven) days., Disp: 8 capsule, Rfl: 2   Physical Exam:   LMP 07/09/2021 (Approximate)   Pertinent Findings  CN II-XII intact Bilateral EAC clear and TM intact with well pneumatized middle ear spaces Weber 512: equal Rinne 512: AC > BC b/l  Anterior rhinoscopy: Septum left deviation; bilateral inferior turbinates with no hypertrophy No lesions of oral cavity/oropharynx; dentition wnl No obviously palpable neck masses/lymphadenopathy/thyromegaly No respiratory distress or stridor    Seprately Identifiable Procedures:  None  Impression & Plans:  Lindsey [name] is a 53 y.o. female with the following   Tinnitus-   This is a  26 YOF presenting today with tinnitus. This was unilateral and present at the time of otitis media. The ringing has since resolved, likely secondary to acute infection. She does have some sensorineural hearing loss, she does not perceive this. Overall she is aback to her baseline with no significant complaints today. No intervention warranted, ok to follow up PRN. Return precautions given.    - f/u PRN   Thank you for allowing me the opportunity to care for your patient. Please do not hesitate to contact me should you have any other questions.  Sincerely, Burna Forts PA-C [facility] Phone: [phone] Fax: [phone]  08/11/2023, 9:47 AM

## 2023-08-11 NOTE — Progress Notes (Signed)
  [address] [place], [place] [address] [phone]  Audiological Evaluation    Name: Lindsey [name]     DOB:   [birth year]      MRN:   [id number]                                                                                     Service Date: 08/11/2023     Accompanied by: unaccompanied   Patient comes today after [name], PA-C sent a referral for a hearing evaluation due to concerns with tinnitus.   Symptoms Yes Details  Hearing loss  []    Tinnitus  [x]  Left ear - since almost 1 weeks ago - reports to hear a hissing almost a buzzing sound  Ear pain/ infections/pressure  []  Was told at the hospital she had an left ear infections and was treated with antibiotics.  Balance problems  []    Noise exposure history  []    Previous ear surgeries  []    Family history of hearing loss  []    Amplification  []    Other  [x]  Reports today the tinnitus seems to be fading.    Otoscopy: Right ear: Clear external ear canals and notable landmarks visualized on the tympanic membrane. Left ear:  Clear external ear canals and notable landmarks visualized on the tympanic membrane.  Tympanometry: Right ear: Type A- Normal external ear canal volume with normal middle ear pressure and tympanic membrane compliance Left ear: Type A- Normal external ear canal volume with normal middle ear pressure and tympanic membrane compliance  Pure tone Audiometry: Both ears- Normal to mild sensorineural hearing loss from [phone] Hz.  Speech Audiometry: Right ear- Speech Reception Threshold (SRT) was obtained at 20 dBHL. Left ear-Speech Reception Threshold (SRT) was obtained at 25 dBHL.   Word Recognition Score Tested using NU-6 (MLV) Right ear: 100% was obtained at a presentation level of 55 dBHL with contralateral masking which is deemed as  excellent. Left ear: 100% was obtained at a presentation level of 55 dBHL with contralateral masking which is deemed as  excellent.   The  hearing test results were completed under headphones and re-checked with inserts and results are deemed to be of good reliability. Test technique:  conventional    Recommendations: Follow up with ENT as scheduled for today. Return for a hearing evaluation if concerns with hearing changes arise or per MD recommendation. Consider [facility] tinnitus clinic in case  patient needs other strategy to manage her tinnitus other than a white noise machine/sound generator.   [name], AUD

## 2023-08-14 ENCOUNTER — Other Ambulatory Visit: Payer: Self-pay

## 2023-08-14 ENCOUNTER — Telehealth: Payer: Self-pay

## 2023-08-14 DIAGNOSIS — F1120 Opioid dependence, uncomplicated: Secondary | ICD-10-CM

## 2023-08-14 DIAGNOSIS — G894 Chronic pain syndrome: Secondary | ICD-10-CM

## 2023-08-14 DIAGNOSIS — D5700 Hb-SS disease with crisis, unspecified: Secondary | ICD-10-CM

## 2023-08-14 NOTE — Telephone Encounter (Signed)
 Pt is requesting a refill of her oxycodone. Please advise [facility]

## 2023-08-15 NOTE — Telephone Encounter (Signed)
 Please advise in [place] absence. Thank you.   Renelda Loma RMA

## 2023-08-16 ENCOUNTER — Ambulatory Visit (HOSPITAL_BASED_OUTPATIENT_CLINIC_OR_DEPARTMENT_OTHER): Payer: MEDICAID | Attending: Nurse Practitioner | Admitting: Internal Medicine

## 2023-08-16 MED ORDER — BUPRENORPHINE HCL-NALOXONE HCL 2-0.5 MG SL SUBL: 2.0000 | SUBLINGUAL_TABLET | Freq: Every day | SUBLINGUAL | 0 refills | Status: DC

## 2023-08-17 ENCOUNTER — Emergency Department (HOSPITAL_COMMUNITY): Payer: MEDICAID

## 2023-08-17 ENCOUNTER — Ambulatory Visit
Admission: EM | Admit: 2023-08-17 | Discharge: 2023-08-17 | Disposition: A | Discharge status: Other Acute Inpatient Hospital | Payer: MEDICAID

## 2023-08-17 ENCOUNTER — Encounter (HOSPITAL_COMMUNITY): Payer: Self-pay | Admitting: Emergency Medicine

## 2023-08-17 ENCOUNTER — Encounter: Payer: Self-pay | Admitting: *Deleted

## 2023-08-17 ENCOUNTER — Other Ambulatory Visit: Payer: Self-pay

## 2023-08-17 ENCOUNTER — Emergency Department (HOSPITAL_COMMUNITY)
Admission: EM | Admit: 2023-08-17 | Discharge: 2023-08-17 | Disposition: A | Discharge status: Home / Self Care | Payer: MEDICAID | Attending: Emergency Medicine | Admitting: Emergency Medicine

## 2023-08-17 DIAGNOSIS — D5700 Hb-SS disease with crisis, unspecified: Secondary | ICD-10-CM | POA: Insufficient documentation

## 2023-08-17 DIAGNOSIS — H9202 Otalgia, left ear: Secondary | ICD-10-CM | POA: Diagnosis not present

## 2023-08-17 LAB — COMPREHENSIVE METABOLIC PANEL WITH GFR
ALT: 15 U/L (ref 0–44)
AST: 28 U/L (ref 15–41)
Albumin: 4.2 g/dL (ref 3.5–5.0)
Alkaline Phosphatase: 33 U/L — ABNORMAL LOW (ref 38–126)
Anion gap: 10 (ref 5–15)
BUN: 11 mg/dL (ref 6–20)
CO2: 20 mmol/L — ABNORMAL LOW (ref 22–32)
Calcium: 8.5 mg/dL — ABNORMAL LOW (ref 8.9–10.3)
Chloride: 110 mmol/L (ref 98–111)
Creatinine, Ser: 0.65 mg/dL (ref 0.44–1.00)
GFR, Estimated: >60 mL/min (ref >60)
Glucose, Bld: 98 mg/dL (ref 70–99)
Potassium: 4.4 mmol/L (ref 3.5–5.1)
Sodium: 140 mmol/L (ref 135–145)
Total Bilirubin: 1.5 mg/dL — ABNORMAL HIGH (ref 0.0–1.2)
Total Protein: 7 g/dL (ref 6.5–8.1)

## 2023-08-17 LAB — CBC WITH DIFFERENTIAL/PLATELET
Abs Immature Granulocytes: 0.04 10*3/uL (ref 0.00–0.07)
Basophils Absolute: 0.1 10*3/uL (ref 0.0–0.1)
Basophils Relative: 0 %
Eosinophils Absolute: 0.1 10*3/uL (ref 0.0–0.5)
Eosinophils Relative: 1 %
HCT: 29.7 % — ABNORMAL LOW (ref 36.0–46.0)
Hemoglobin: 10.6 g/dL — ABNORMAL LOW (ref 12.0–15.0)
Immature Granulocytes: 0 %
Lymphocytes Relative: 19 %
Lymphs Abs: 2.4 10*3/uL (ref 0.7–4.0)
MCH: 30.9 pg (ref 26.0–34.0)
MCHC: 35.7 g/dL (ref 30.0–36.0)
MCV: 86.6 fL (ref 80.0–100.0)
Monocytes Absolute: 0.7 10*3/uL (ref 0.1–1.0)
Monocytes Relative: 5 %
Neutro Abs: 9.1 10*3/uL — ABNORMAL HIGH (ref 1.7–7.7)
Neutrophils Relative %: 75 %
Platelets: 402 10*3/uL — ABNORMAL HIGH (ref 150–400)
RBC: 3.43 MIL/uL — ABNORMAL LOW (ref 3.87–5.11)
RDW: 14.8 % (ref 11.5–15.5)
WBC: 12.4 10*3/uL — ABNORMAL HIGH (ref 4.0–10.5)
nRBC: 0.2 % (ref 0.0–0.2)

## 2023-08-17 LAB — RETICULOCYTES
Immature Retic Fract: 21 % — ABNORMAL HIGH (ref 2.3–15.9)
RBC.: 3.44 MIL/uL — ABNORMAL LOW (ref 3.87–5.11)
Retic Count, Absolute: 111.5 10*3/uL (ref 19.0–186.0)
Retic Ct Pct: 3.2 % — ABNORMAL HIGH (ref 0.4–3.1)

## 2023-08-17 MED ORDER — ONDANSETRON HCL 4 MG/2ML IJ SOLN
4.0000 mg | Freq: Once | INTRAMUSCULAR | Status: AC
  Administered 2023-08-17: 4 mg via INTRAVENOUS
  Filled 2023-08-17: qty 2

## 2023-08-17 MED ORDER — GADOBUTROL 1 MMOL/ML IV SOLN
6.0000 mL | Freq: Once | INTRAVENOUS | Status: AC | PRN
  Administered 2023-08-17: 6 mL via INTRAVENOUS

## 2023-08-17 MED ORDER — LORAZEPAM 2 MG/ML IJ SOLN: 0.5000 mg | Freq: Once | INTRAMUSCULAR | Status: DC

## 2023-08-17 MED ORDER — LORAZEPAM 2 MG/ML IJ SOLN
0.5000 mg | Freq: Once | INTRAMUSCULAR | Status: AC
Start: 2023-08-17 — End: 2023-08-17
  Administered 2023-08-17: 0.5 mg via INTRAVENOUS
  Filled 2023-08-17: qty 1

## 2023-08-17 MED ORDER — KETOROLAC TROMETHAMINE 30 MG/ML IJ SOLN
15.0000 mg | Freq: Once | INTRAMUSCULAR | Status: AC
  Administered 2023-08-17: 15 mg via INTRAVENOUS
  Filled 2023-08-17: qty 1

## 2023-08-17 MED ORDER — DIPHENHYDRAMINE HCL 50 MG/ML IJ SOLN
12.5000 mg | Freq: Once | INTRAMUSCULAR | Status: AC
  Administered 2023-08-17: 12.5 mg via INTRAVENOUS
  Filled 2023-08-17: qty 1

## 2023-08-17 MED ORDER — SODIUM CHLORIDE 0.9 % IV BOLUS
1000.0000 mL | Freq: Once | INTRAVENOUS | Status: AC
  Administered 2023-08-17: 1000 mL via INTRAVENOUS

## 2023-08-17 MED ORDER — HYDROMORPHONE HCL 1 MG/ML IJ SOLN
1.0000 mg | Freq: Once | INTRAMUSCULAR | Status: AC
  Administered 2023-08-17: 1 mg via INTRAVENOUS
  Filled 2023-08-17: qty 1

## 2023-08-17 NOTE — ED Triage Notes (Addendum)
 Pt crying upon arrival to Urgent Care. She walked back to restroom and informed staff she felt like she was going to pass out. Pt drying heaving and crying. Assisted pt to chair in room 1 for triage. Pt reports history of tinnitus- She states she put 2 drops room temp cypress oil in her left ear around 1230 which then became very painful. She attempted to rinse the oil out pta

## 2023-08-17 NOTE — ED Provider Notes (Signed)
 [place] EMERGENCY DEPARTMENT AT [facility] Provider Note   CSN: [id number] Arrival date & time: 08/17/23  1525     History  Chief Complaint  Patient presents with   Sickle Cell Pain Crisis   HPI Lindsey [name] is a 53 y.o. female with sickle cell disease, chronic pain, vaso-occlusive disease presenting for back pain and left ear pain. States that left ear pain has been going on for about 2 weeks now with intermittent tinnitus. She has been seen in urgent care and then followed by ENT.  Initial concern was otitis media.  She has been taking Augmentin.  Was evaluated by ENT last week and workup was overall reassuring.  Also states she has been using "cypress oil" in the left ear to help with her symptoms.  She states that the pain at times seems to be rating up into her head causing a headache. She denies fever.  Also reports lower back pain that started 2 days ago.  It is in the midline of the back.  She states its pain she has had before with sickle cell pain.  Denies any radiation into the abdomen.  Denies chest pain shortness of breath cough and fever.   Sickle Cell Pain Crisis      Home Medications Prior to Admission medications   Medication Sig Start Date End Date Taking? Authorizing Provider  amoxicillin-clavulanate (AUGMENTIN) 875-125 MG tablet Take 1 tablet by mouth 2 (two) times daily. 08/09/23   Daryll Drown, NP  buprenorphine-naloxone (SUBOXONE) 2-0.5 mg SUBL SL tablet Place 2 tablets under the tongue daily. 08/16/23   Ivonne Andrew, NP  carboxymethylcellulose (REFRESH PLUS) 0.5 % SOLN Place 1 drop into both eyes daily as needed (dry eyes).    [provider]  diphenhydrAMINE (BENADRYL) 25 MG tablet Take 50 mg by mouth every 6 (six) hours as needed for itching or sleep.    [provider]  gabapentin (NEURONTIN) 400 MG capsule TAKE 1 CAPSULE(400 MG) BY MOUTH THREE TIMES DAILY 03/13/23   Massie Maroon, FNP  ibuprofen (ADVIL) 200 MG  tablet Take 400 mg by mouth every 6 (six) hours as needed for moderate pain (pain score 4-6).    [provider]  ketorolac (TORADOL) 10 MG tablet Take 1 tablet (10 mg total) by mouth every 6 (six) hours as needed (pain). 03/31/23   Piontek, Denny Peon, MD  TYLENOL 8 HOUR ARTHRITIS PAIN 650 MG CR tablet Take 650 mg by mouth every 8 (eight) hours as needed for pain.    [provider]  Vitamin D, Ergocalciferol, (DRISDOL) 1.25 MG (50000 UNIT) CAPS capsule Take 1 capsule (50,000 Units total) by mouth every 7 (seven) days. 06/28/23   Donell Beers, FNP      Allergies    Ketamine    Review of Systems   See HPI   Physical Exam   Vitals:   08/17/23 1951 08/17/23 2000  BP:  (!) 121/104  Pulse:  75  Resp:    Temp: 97.9 F (36.6 C)   SpO2:  98%    CONSTITUTIONAL:  well-appearing, NAD NEURO:  Alert and oriented x 3, CN 3-12 grossly intact EYES:  eyes equal and reactive ENT/NECK:  Supple, no stridor, right middle ear.  Normal, left ear was somewhat injected and there was some oily substance in the ear canal, no bleeding or pustulant discharge CARDIO:  regular rate and rhythm, appears well-perfused  PULM:  No respiratory distress, CTAB GI/GU:  non-distended, soft  MSK/SPINE:  No gross deformities, no edema, moves all extremities, some mild tenderness with palpation of the midline of the lumbar region, no step-off fluctuance erythema or ecchymosis noted SKIN:  no rash, atraumatic   *Additional and/or pertinent findings included in MDM below    ED Results / Procedures / Treatments   Labs (all labs ordered are listed, but only abnormal results are displayed) Labs Reviewed  COMPREHENSIVE METABOLIC PANEL WITH GFR - Abnormal; Notable for the following components:      Result Value   CO2 20 (*)    Calcium 8.5 (*)    Alkaline Phosphatase 33 (*)    Total Bilirubin 1.5 (*)    All other components within normal limits  CBC WITH DIFFERENTIAL/PLATELET - Abnormal; Notable for  the following components:   WBC 12.4 (*)    RBC 3.43 (*)    Hemoglobin 10.6 (*)    HCT 29.7 (*)    Platelets 402 (*)    Neutro Abs 9.1 (*)    All other components within normal limits  RETICULOCYTES - Abnormal; Notable for the following components:   Retic Ct Pct 3.2 (*)    RBC. 3.44 (*)    Immature Retic Fract 21.0 (*)    All other components within normal limits    EKG None  Radiology MR Brain W and Wo Contrast Result Date: 08/17/2023 CLINICAL DATA:  Initial evaluation for acute headache. EXAM: MRI HEAD WITHOUT AND WITH CONTRAST TECHNIQUE: Multiplanar, multiecho pulse sequences of the brain and surrounding structures were obtained without and with intravenous contrast. CONTRAST:  6mL GADAVIST GADOBUTROL 1 MMOL/ML IV SOLN COMPARISON:  None Available. FINDINGS: Brain: Cerebral volume within normal limits for age. No focal parenchymal signal abnormality. No abnormal foci of restricted diffusion to suggest acute or subacute ischemia. Gray-white matter differentiation well maintained. No encephalomalacia to suggest chronic cortical infarction or other insult. No foci of susceptibility artifact indicative of acute or chronic intracranial blood products. No mass lesion, midline shift or mass effect. Ventricles normal in size and morphology without hydrocephalus. No extra-axial fluid collection. Pituitary gland and suprasellar region within normal limits. No abnormal enhancement. Vascular: Major intracranial vascular flow voids are well maintained. Skull and upper cervical spine: Craniocervical junction within normal limits. Decreased T1 signal intensity noted within the visualized bone marrow, nonspecific, but most commonly related to anemia, smoking, or obesity. No scalp soft tissue abnormality. Sinuses/Orbits: Globes and orbital soft tissues are within normal limits. Paranasal sinuses are largely clear. No significant mastoid effusion. Other: None. IMPRESSION: Normal brain MRI. No acute intracranial  abnormality or findings to explain patient's symptoms. Electronically Signed   By: Rise Mu M.D.   On: 08/17/2023 21:02    Procedures Procedures    Medications Ordered in ED Medications  HYDROmorphone (DILAUDID) injection 1 mg (1 mg Intravenous Given 08/17/23 1658)  ketorolac (TORADOL) 30 MG/ML injection 15 mg (15 mg Intravenous Given 08/17/23 1659)  ondansetron (ZOFRAN) injection 4 mg (4 mg Intravenous Given 08/17/23 1659)  sodium chloride 0.9 % bolus 1,000 mL (0 mLs Intravenous Stopped 08/17/23 2134)  LORazepam (ATIVAN) injection 0.5 mg (0.5 mg Intravenous Given 08/17/23 1717)  gadobutrol (GADAVIST) 1 MMOL/ML injection 6 mL (6 mLs Intravenous Contrast Given 08/17/23 1812)  diphenhydrAMINE (BENADRYL) injection 12.5 mg (12.5 mg Intravenous Given 08/17/23 2133)    ED Course/ Medical Decision Making/ A&P  Medical Decision Making Amount and/or Complexity of Data Reviewed Labs: ordered. Radiology: ordered.  Risk Prescription drug management.   Initial Impression and Ddx 53 year old well-appearing female presenting for left ear pain and lower mid back pain.  Exam notable for oily substance in the left ear canal with some injected TM but otherwise reassuring.  DDx includes otitis media, brain abscess, mastoiditis, sickle cell pain crisis, other. Patient PMH that increases complexity of ED encounter:   sickle cell disease, chronic pain, vaso-occlusive disease  Interpretation of Diagnostics - I independent reviewed and interpreted the labs as followed: anemia (but improved from last check), mild elevated total bili (1.5), retic count elevated but improved from last check  - I independently visualized the following imaging with scope of interpretation limited to determining acute life threatening conditions related to emergency care: MRI brain, which revealed a normal brain  Patient Reassessment and Ultimate Disposition/Management On reassessment,  patient was resting comfortably asleep in her bed, heart rate was 65 and satting 100%.  Labs appear overall slightly improved from last check.  Patient stated that she was feeling much better as well aside from persistent pain in the left ear.  Reviewed her recent evaluation by ENT and had an overall reassuring workup at that time about a week ago.  I did advise her to reach back out to ENT if her symptoms persist.  Patient is followed by the sickle cell pain clinic.  Also advised her to follow-up for what is likely sickle cell pain in her lower back.  Discussed return precautions.  Discharged in good condition.  Suspect the only substance that her left rib was this time.  So we will she has been using for treatment.  Overall does not appear that she has an active middle ear infection at this time.  Patient management required discussion with the following services or consulting groups:  None  Complexity of Problems Addressed Acute complicated illness or Injury  Additional Data Reviewed and Analyzed Further history obtained from: Past medical history and medications listed in the EMR and Prior ED visit notes  Patient Encounter Risk Assessment Consideration of hospitalization         Final Clinical Impression(s) / ED Diagnoses Final diagnoses:  Sickle cell pain crisis [facility])  Left ear pain    Rx / DC Orders ED Discharge Orders     None         Gareth Eagle, PA-C 08/17/23 2147    Benjiman Core, MD 08/17/23 2321

## 2023-08-17 NOTE — ED Provider Notes (Signed)
 [facility]    CSN: [id number] Arrival date & time: 08/17/23  1330      History   Chief Complaint Chief Complaint  Patient presents with   Otalgia    HPI Ruhee Shalana Jardin is a 53 y.o. female, history of vaso-occlusive sickle cell anemia, chronic tinnitus involving both ears, history of opiate abuse  treated with Suboxone. Patient is here with severe left ear pain, nausea, and sensation that she is about to pass-out. Patient was ambulating in hallway, crying uncontrollably, and reporting pain deep within her ear. She instilled 2 drops of essential oil in the left ear after experiencing ear discomfort and ringing in her.  Ear problems have been on-going for greater than one month and she has seen ENT through [facility]  and was medically cleared and determined to have sensorial hearing loss.  Patient has sickle cell anemia and was recently admitted due to sickle cell crisis and presented at that time with left ear pain. Patient endorses feeling generally unwell and is agreement to go to the emergency department for evaluation for suspected sickle cell crisis.   Past Medical History:  Diagnosis Date   Acute kidney injury (HCC) 01/18/2021   Acute pain of left shoulder    Cough 06/27/2020   Hypokalemia 06/27/2020   Hypoxia    Leukocytosis 09/03/2016   Opiate abuse, episodic (HCC) 09/25/2017   Opioid dependence in remission (HCC)    Right leg pain    Sickle cell crisis (HCC)    Vasoocclusive sickle cell crisis (HCC) 07/17/2021    Patient Active Problem List   Diagnosis Date Noted   Otitis media of left ear 08/08/2023   Need for influenza vaccination 06/27/2023   Hb-SS disease without crisis (HCC) 06/27/2023   Insomnia 06/27/2023   Screening mammogram for breast cancer 06/27/2023   Sickle cell anemia with crisis (HCC) 05/03/2022   Acute low back pain 03/25/2022   GAD (generalized anxiety disorder) 03/25/2022   Sickle cell pain crisis (HCC) 02/11/2022   Acute  sickle cell crisis (HCC) 01/28/2022   Anxiety and depression 12/29/2021   Anemia of chronic disease 10/29/2021   Sickle cell anemia (HCC) 09/10/2019   Sickle cell disease (HCC) 10/01/2017   Chronic pain 10/01/2017   Chronic narcotic use 09/25/2017   Leukocytosis 09/03/2016    Past Surgical History:  Procedure Laterality Date   APPENDECTOMY     CESAREAN SECTION     OTHER SURGICAL HISTORY     c-section    OB History   No obstetric history on file.      Home Medications    Prior to Admission medications   Medication Sig Start Date End Date Taking? Authorizing Provider  amoxicillin-clavulanate (AUGMENTIN) 875-125 MG tablet Take 1 tablet by mouth 2 (two) times daily. 08/09/23   Daryll Drown, NP  buprenorphine-naloxone (SUBOXONE) 2-0.5 mg SUBL SL tablet Place 2 tablets under the tongue daily. 08/16/23   Ivonne Andrew, NP  carboxymethylcellulose (REFRESH PLUS) 0.5 % SOLN Place 1 drop into both eyes daily as needed (dry eyes).    [provider]  diphenhydrAMINE (BENADRYL) 25 MG tablet Take 50 mg by mouth every 6 (six) hours as needed for itching or sleep.    [provider]  gabapentin (NEURONTIN) 400 MG capsule TAKE 1 CAPSULE(400 MG) BY MOUTH THREE TIMES DAILY 03/13/23   Massie Maroon, FNP  ibuprofen (ADVIL) 200 MG tablet Take 400 mg by mouth every 6 (six) hours as needed for moderate pain (pain  score 4-6).    [provider]  ketorolac (TORADOL) 10 MG tablet Take 1 tablet (10 mg total) by mouth every 6 (six) hours as needed (pain). 03/31/23   Piontek, Denny Peon, MD  TYLENOL 8 HOUR ARTHRITIS PAIN 650 MG CR tablet Take 650 mg by mouth every 8 (eight) hours as needed for pain.    [provider]  Vitamin D, Ergocalciferol, (DRISDOL) 1.25 MG (50000 UNIT) CAPS capsule Take 1 capsule (50,000 Units total) by mouth every 7 (seven) days. 06/28/23   Donell Beers, FNP    Family History Family History  Problem Relation Age of Onset   Stroke Neg Hx         none that she knows of    Seizures Neg Hx     Social History Social History   Tobacco Use   Smoking status: Former    Types: Cigars, Cigarettes   Smokeless tobacco: Never  Vaping Use   Vaping status: Every Day  Substance Use Topics   Alcohol use: Yes    Comment: occasionally   Drug use: Not Currently    Types: Marijuana     Allergies   Ketamine   Review of Systems Review of Systems  HENT:  Positive for ear pain.      Physical Exam Triage Vital Signs ED Triage Vitals  Encounter Vitals Group     BP 08/17/23 1403 (!) 101/90     Systolic BP Percentile --      Diastolic BP Percentile --      Pulse Rate 08/17/23 1403 (!) 118     Resp 08/17/23 1403 20     Temp 08/17/23 1403 98.5 F (36.9 C)     Temp Source 08/17/23 1403 Oral     SpO2 08/17/23 1403 100 %     Weight --      Height --      Head Circumference --      Peak Flow --      Pain Score 08/17/23 1359 10     Pain Loc --      Pain Education --      Exclude from Growth Chart --    No data found.  Updated Vital Signs BP (!) 101/90 (BP Location: Right Arm)   Pulse (!) 118 Comment: Pt crying in pain  Temp 98.5 F (36.9 C) (Oral)   Resp 20   LMP 07/09/2021 (Approximate)   SpO2 100%   Visual Acuity Right Eye Distance:   Left Eye Distance:   Bilateral Distance:    Right Eye Near:   Left Eye Near:    Bilateral Near:     Physical Exam Constitutional:      General: She is in acute distress.     Appearance: She is ill-appearing.  HENT:     Head: Normocephalic and atraumatic.  Eyes:     Extraocular Movements: Extraocular movements intact.     Pupils: Pupils are equal, round, and reactive to light.  Cardiovascular:     Rate and Rhythm: Tachycardia present.  Pulmonary:     Effort: Pulmonary effort is normal. Tachypnea present.  Musculoskeletal:     Cervical back: Normal range of motion.  Skin:    General: Skin is warm.  Neurological:     General: No focal deficit present.     Mental  Status: She is oriented to person, place, and time.    UC Treatments / Results  Labs (all labs ordered are listed, but only abnormal results are  displayed) Labs Reviewed - No data to display  EKG   Radiology No results found.  Procedures Procedures (including critical care time)  Medications Ordered in UC Medications - No data to display  Initial Impression / Assessment and Plan / UC Course  I have reviewed the triage vital signs and the nursing notes.  Pertinent labs & imaging results that were available during my care of the patient were reviewed by me and considered in my medical decision making (see chart for details). Otalgia involving the left ear with acute distress in patient with history of vaso-occlusive sickle cell anemia. Inner left ear appears mildly excoriated this likely why she experienced discomfort when instilling oral into the ear.  Physician for possible sickle cell crisis as patient presented to the ED 2 weeks ago with similar course of symptoms and was admitted for an acute sickle cell crisis.  EMS called and will transport patient to nearest appropriate [facility]  Final Clinical Impressions(s) / UC Diagnoses   Final diagnoses:  Otalgia, left ear  Sickle cell disease with crisis [facility])   Discharge Instructions   None    ED Prescriptions   None    PDMP not reviewed this encounter.   Bing Neighbors, NP 08/17/23 1431

## 2023-08-17 NOTE — ED Notes (Signed)
 Patient is being discharged from the Urgent Care and sent to the Emergency Department via EMS . Per Jerrilyn Cairo, NP, patient is in need of higher level of care due to possible sickle cell crisis. Patient is aware and verbalizes understanding of plan of care.  Vitals:   08/17/23 1403  BP: (!) 101/90  Pulse: (!) 118  Resp: 20  Temp: 98.5 F (36.9 C)  SpO2: 100%

## 2023-08-17 NOTE — ED Triage Notes (Signed)
 Patient BIB EMS from UC c/o sickle cell crisis this afternoon.  500 ml NS Bolus, 650 tylenol, 300 mcg given by ems. Patient denies N/V. Patient 8/10 generalize pain.

## 2023-08-17 NOTE — Discharge Instructions (Signed)
 Evaluate and today was overall reassuring.  Your labs actually look somewhat improved from last check.  Recommend you follow-up with sickle cell pain clinic.  If the symptoms of your left ear persist please follow-up with ENT.  If you have worsening back pain, chest pain or shortness of breath, develop a fever or any other concerning symptom please return to the ED for further evaluation.

## 2023-08-17 NOTE — ED Notes (Signed)
 EMS at bedside

## 2023-08-17 NOTE — ED Triage Notes (Addendum)
 EMS contacted for transport- Pt has hx of sickle cell anemia and presented with same symptoms recently when she was in crisis. She drove herself here.  Poison control [phone]) also contacted [id number] oil. Spoke with Merdis Delay who states oil is an irritant and astringent. Recommends irrigation and pain management.

## 2023-08-18 ENCOUNTER — Ambulatory Visit: Payer: Self-pay

## 2023-08-18 NOTE — Telephone Encounter (Signed)
 Called pt and made an appointment. KH

## 2023-08-18 NOTE — Telephone Encounter (Signed)
  Chief Complaint: ringing in her right ear Symptoms: severe ringing and humming in her right ear Frequency: 2 weeks Pertinent Negatives: Patient denies fever, CP, SOB Disposition: [] ED /[] Urgent Care (no appt availability in office) / [] Appointment(In office/virtual)/ []  [facility]/ [] Home Care/ [] Refused Recommended Disposition /[] [facility]/ [x]  Follow-up with PCP Additional Notes: patient calling in crying with complaints of severe ringing and humming in her right ear. Patient endorses that her hearing on her right side is decreased with how loud the ringing is. Patient was seen at [facility] for ear pain last night and was sent to ED for sickle cell pain crisis. Patient states she has been unable to sleep due to ringing in her ear. Per protocol, patient is recommended to be seen in three days. Patient called ENT to see if they could see her today-unable to see patient until next Wednesday.  Patient is crying throughout phone call saying "no one will help me." "No one cares." Attempted to discuss with patient that ED evaluation is available to her but she refused. Patient is needing to speak with PCP staff today. Patient hung up on Nurse triage prior to any verbalization of understanding of plan.    Copied from CRM [id number]. Topic: Clinical - Red Word Triage >> Aug 18, 2023 10:34 AM Geroge Baseman wrote: Red Word that prompted transfer to Nurse Triage: Ear is still bothering her, just got out of the hospital, its causing severe pain. ENT not avail till Wednesday. No sleep ion days, hysterically crying Reason for Disposition  MODERATE-SEVERE tinnitus (i.e., interferes with work, school, or sleep)  Answer Assessment - Initial Assessment Questions 1. DESCRIPTION: "Describe the sound you are hearing." (e.g., buzzing, hissing, humming, ringing)     Ringing and hissing 2. LOCATION: "Is the sound in one or both ears?" If one, ask: "Which ear?"     Right ear 3. SEVERITY: "How bad is it?"     - MILD: Doesn't interfere with normal activities, only can hear in a quiet room.    - MODERATE-SEVERE: interferes with work, school, sleep, or other activities.      Severe-patient is crying uncontrollably 4. ONSET: "When did this begin?" "Did it start suddenly or come on gradually?"     Two weeks ago 5. PATTERN: "Does this come and go, or has it been constant since it started?"     constant 6. HEARING LOSS: "Is your hearing decreased?" (e.g., normal, decreased)       Yes-ringing is quite loud and patient is having decreased hearing 7. OTHER SYMPTOMS: "Do you have any other symptoms?" (e.g., dizziness, earache)     Slight dizziness  Protocols used: Tinnitus-A-AH

## 2023-08-21 ENCOUNTER — Encounter: Payer: Self-pay | Admitting: Nurse Practitioner

## 2023-08-21 ENCOUNTER — Ambulatory Visit (INDEPENDENT_AMBULATORY_CARE_PROVIDER_SITE_OTHER): Payer: MEDICAID | Admitting: Nurse Practitioner

## 2023-08-21 VITALS — BP 129/81 | HR 106 | Temp 98.6°F | Wt 142.6 lb

## 2023-08-21 DIAGNOSIS — H9202 Otalgia, left ear: Secondary | ICD-10-CM | POA: Diagnosis not present

## 2023-08-21 MED ORDER — KETOROLAC TROMETHAMINE 30 MG/ML IJ SOLN
30.0000 mg | Freq: Once | INTRAMUSCULAR | Status: AC
  Administered 2023-08-21: 30 mg via INTRAMUSCULAR

## 2023-08-21 MED ORDER — OFLOXACIN 0.3 % OT SOLN: 5.0000 [drp] | Freq: Two times a day (BID) | OTIC | 0 refills | Status: DC

## 2023-08-21 NOTE — Patient Instructions (Signed)
 1. Left ear pain (Primary)  - ofloxacin (FLOXIN) 0.3 % OTIC solution; Place 5 drops into the left ear 2 (two) times daily.  Dispense: 5 mL; Refill: 0

## 2023-08-21 NOTE — Progress Notes (Signed)
 Subjective   Patient ID: Lindsey [name], female    DOB: [birth year], 53 y.o.   MRN: [id number]  Chief Complaint  Patient presents with   Tinnitus    Left ear ringing x3 weeks    Follow-up    Referring provider: Donell Beers, FNP  Lindsey [name] is a 52 y.o. 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  She has been having ringing in her ears and decreased hearing.  She states that her ear feels full.  TM does look clear on exam.  Canals are erythematous.  Will order ofloxacin drops and give Toradol injection in office today.  Patient does have an appointment with otolaryngology tomorrow.  We discussed that she may need to talk to them about getting tubes placed in her ear. Denies f/c/s, n/v/d, hemoptysis, PND, leg swelling Denies chest pain or edema    Allergies  Allergen Reactions   Ketamine Anxiety and Other (See Comments)    Tachycardia    Immunization History  Administered Date(s) Administered   Influenza, Seasonal, Injecte, Preservative Fre 06/27/2023   Influenza,inj,Quad PF,6+ Mos 03/30/2018, 01/29/2019   Pneumococcal Polysaccharide-23 10/05/2017, 05/20/2021   Tdap 08/02/2022    Tobacco History: Social History   Tobacco Use  Smoking Status Former   Types: Cigars, Cigarettes  Smokeless Tobacco Never   Counseling given: Not Answered   Outpatient Encounter Medications as of 08/21/2023  Medication Sig   buprenorphine-naloxone (SUBOXONE) 2-0.5 mg SUBL SL tablet Place 2 tablets under the tongue daily.   carboxymethylcellulose (REFRESH PLUS) 0.5 % SOLN  Place 1 drop into both eyes daily as needed (dry eyes).   diphenhydrAMINE (BENADRYL) 25 MG tablet Take 50 mg by mouth every 6 (six) hours as needed for itching or sleep.   gabapentin (NEURONTIN) 400 MG capsule TAKE 1 CAPSULE(400 MG) BY MOUTH THREE TIMES DAILY   ibuprofen (ADVIL) 200 MG tablet Take 400 mg by mouth every 6 (six) hours as needed for moderate pain (pain score 4-6).   ketorolac (TORADOL) 10 MG tablet Take 1 tablet (10 mg total) by mouth every 6 (six) hours as needed (pain).   ofloxacin (FLOXIN) 0.3 % OTIC solution Place 5 drops into the left ear 2 (two) times daily.   Vitamin D, Ergocalciferol, (DRISDOL) 1.25 MG (50000 UNIT) CAPS capsule Take 1 capsule (50,000 Units total) by mouth every 7 (seven) days.   amoxicillin-clavulanate (AUGMENTIN) 875-125 MG tablet Take 1 tablet by mouth 2 (two) times daily. (Patient not taking: Reported on 08/21/2023)   TYLENOL 8 HOUR ARTHRITIS PAIN 650 MG CR tablet Take 650 mg by mouth every 8 (eight) hours as needed for pain. (Patient not taking: Reported on 08/21/2023)   Facility-Administered Encounter Medications as of 08/21/2023  Medication   ketorolac (TORADOL) 30 MG/ML injection 30 mg    Review of Systems  Review of Systems  Constitutional: Negative.   HENT: Negative.    Cardiovascular: Negative.   Gastrointestinal: Negative.   Allergic/Immunologic: Negative.   Neurological: Negative.   Psychiatric/Behavioral: Negative.       Objective:   BP 129/81   Pulse (!) 106   Temp 98.6 F (37 C) (Oral)  Wt 142 lb 9.6 oz (64.7 kg)   LMP 07/09/2021 (Approximate)   SpO2 99%   BMI 27.85 kg/m   Wt Readings from Last 5 Encounters:  08/21/23 142 lb 9.6 oz (64.7 kg)  08/17/23 151 lb 14.4 oz (68.9 kg)  08/11/23 152 lb (68.9 kg)  08/07/23 154 lb 12.2 oz (70.2 kg)  06/27/23 152 lb (68.9 kg)     Physical Exam Vitals and nursing note reviewed.  Constitutional:      General: She is not in acute distress.    Appearance: She is well-developed.   HENT:     Left Ear: Tympanic membrane and ear canal normal.  Cardiovascular:     Rate and Rhythm: Normal rate and regular rhythm.  Pulmonary:     Effort: Pulmonary effort is normal.     Breath sounds: Normal breath sounds.  Neurological:     Mental Status: She is alert and oriented to person, place, and time.       Assessment & Plan:   Left ear pain -     Ofloxacin; Place 5 drops into the left ear 2 (two) times daily.  Dispense: 5 mL; Refill: 0 -     Ketorolac Tromethamine     Return if symptoms worsen or fail to improve.   Jerrlyn Morel, NP 08/21/2023

## 2023-08-22 ENCOUNTER — Other Ambulatory Visit (INDEPENDENT_AMBULATORY_CARE_PROVIDER_SITE_OTHER): Payer: Self-pay | Admitting: Physician Assistant

## 2023-08-22 ENCOUNTER — Ambulatory Visit (INDEPENDENT_AMBULATORY_CARE_PROVIDER_SITE_OTHER): Payer: MEDICAID | Admitting: Audiology

## 2023-08-22 DIAGNOSIS — H90A32 Mixed conductive and sensorineural hearing loss, unilateral, left ear with restricted hearing on the contralateral side: Secondary | ICD-10-CM | POA: Diagnosis not present

## 2023-08-22 DIAGNOSIS — H9011 Conductive hearing loss, unilateral, right ear, with unrestricted hearing on the contralateral side: Secondary | ICD-10-CM

## 2023-08-22 DIAGNOSIS — H9041 Sensorineural hearing loss, unilateral, right ear, with unrestricted hearing on the contralateral side: Secondary | ICD-10-CM | POA: Diagnosis not present

## 2023-08-22 NOTE — Progress Notes (Addendum)
 Pt seen in audiology today with severe ear pain. Results show right conductive hearing loss, will order CT temporal for further evaluation.  I spoke with her on the phone today, I will be seeing her tomorrow.  She verbalized understanding and agreement to today's plan.

## 2023-08-22 NOTE — Progress Notes (Signed)
  [address] [place], [place] [address] [phone]  Audiological Evaluation    Name: Lindsey [name]     DOB:   [birth year]      MRN:   [id number]                                                                                     Service Date: 08/22/2023     Accompanied by: unaccompanied    Patient comes today after Lorane Rocker, PA-C sent a referral for a hearing evaluation due to concerns with ear pain.   Symptoms Yes Details  Hearing loss  [x]  08-11-2023: Both ears- Normal to mild sensorineural hearing loss from [phone] Hz.   Tinnitus  [x]  Left ear - reports it is better since she started using the drops  Ear pain/ infections/pressure  [x]  Left ear pain, clogged sensation - started with eardrops yesterday  Balance problems  []    Noise exposure history  []    Previous ear surgeries  []    Family history of hearing loss  []    Amplification  []    Other  []  Sickle cell anemia    Otoscopy: Right ear: Clear external ear canals and notable landmarks visualized on the tympanic membrane. Left ear:  Abnormal eardrum appearance.  Tympanometry: Right ear: Type Ad- Normal external ear canal volume with normal middle ear pressure and high tympanic membrane compliance Left ear: Type A- Normal external ear canal volume with normal middle ear pressure and tympanic membrane compliance  Tympanograms repeated several times.   Pure tone Audiometry: Right ear- Normal hearing from 250-300 Hz, then mild sensorineural hearing loss from 4000 Hz - 8000 Hz. Left ear-  Normal to severe mixed hearing loss from [phone] Hz.    Speech Audiometry: Right ear- Speech Reception Threshold (SRT) was obtained at 15 dBHL. Left ear-Speech Reception Threshold (SRT) was obtained at 20 dBHL.   Word Recognition Score Tested using NU-6 (MLV) Right ear: 100% was obtained at a presentation level of 55 dBHL with contralateral masking which is deemed as  excellent. Left ear: 100% was obtained at  a presentation level of 70 dBHL with contralateral masking which is deemed as  excellent.   The hearing test results were completed under headphones and re-checked with inserts and results are deemed to be of good to fair reliability. Test technique:  conventional    Impression: Previous audiogram did not have the left conductive component that was obtained today..    Recommendations: Follow up with ENT as scheduled. Return for a hearing evaluation if concerns with hearing changes arise or per MD recommendation. Repeat audiogram after medical care.   Emmelyn Schmale MARIE LEROUX-MARTINEZ, AUD

## 2023-08-23 ENCOUNTER — Ambulatory Visit (INDEPENDENT_AMBULATORY_CARE_PROVIDER_SITE_OTHER): Payer: MEDICAID

## 2023-08-23 ENCOUNTER — Encounter (INDEPENDENT_AMBULATORY_CARE_PROVIDER_SITE_OTHER): Payer: Self-pay

## 2023-08-23 DIAGNOSIS — H90A32 Mixed conductive and sensorineural hearing loss, unilateral, left ear with restricted hearing on the contralateral side: Secondary | ICD-10-CM

## 2023-08-23 DIAGNOSIS — H9312 Tinnitus, left ear: Secondary | ICD-10-CM | POA: Diagnosis not present

## 2023-08-23 MED ORDER — AMOXICILLIN-POT CLAVULANATE 875-125 MG PO TABS: 1.0000 | ORAL_TABLET | Freq: Two times a day (BID) | ORAL | 0 refills | Status: DC

## 2023-08-23 NOTE — Progress Notes (Signed)
 Dear Dr. Carlette Cheers, Here is my assessment for our mutual patient, Lindsey [name]. Thank you for allowing me the opportunity to care for your patient. Please do not hesitate to contact me should you have any other questions. Sincerely, Belma Boxer PA-C  Otolaryngology Clinic Note Referring provider: Dr. Carlette Cheers HPI:  Lindsey [name] is a 53 y.o. female kindly referred by Dr. Carlette Cheers   The patient is a 53 year old female seen in our office for evaluation of left-sided tinnitus.  The patient was last seen in the office on 08/11/2023.  Below is a recap of that encounter.  The patient is a 53 year old female seen in our office for evaluation of left-sided tinnitus.  The patient notes that she developed left-sided tinnitus on 08/05/2023.  She describes this is high-pitched and consistent, no pulsatile nature.  She followed up in urgent care was told that she had redness and otitis media.  She notes some associated minor nasal congestion, no fever.  She was placed on antibiotics.  She notes that after starting the antibiotics the upper respiratory symptoms resolved as well as the tinnitus.  She denies any ringing today, she denies any neurologic complaints.  She reports her hearing is at its baseline and does not perceive any hearing deficits.  She notes a history of some minimal seasonal allergies.  Update 08/23/2023  Since her last office visit she has had a return of the tinnitus which had previously stopped.  She has become very anxious about this and emotional.  She notes that she is not having any pain in the left ear but is having a high-pitched ringing all the time.  She has been on lying reading about tinnitus which has increased her anxiety.  She denies any associated dizziness, no neurologic deficits, no drainage, no fever.  She notes the sound machine has been helping.       Independent Review of Additional Tests or Records:  Audiological evaluation for 15 2025 Otoscopy: Right ear: Clear  external ear canals and notable landmarks visualized on the tympanic membrane. Left ear:  Abnormal eardrum appearance.   Tympanometry: Right ear: Type Ad- Normal external ear canal volume with normal middle ear pressure and high tympanic membrane compliance Left ear: Type A- Normal external ear canal volume with normal middle ear pressure and tympanic membrane compliance  Tympanograms repeated several times.    Pure tone Audiometry: Right ear- Normal hearing from 250-300 Hz, then mild sensorineural hearing loss from 4000 Hz - 8000 Hz. Left ear-  Normal to severe mixed hearing loss from [phone] Hz.     Speech Audiometry: Right ear- Speech Reception Threshold (SRT) was obtained at 15 dBHL. Left ear-Speech Reception Threshold (SRT) was obtained at 20 dBHL.   Word Recognition Score Tested using NU-6 (MLV) Right ear: 100% was obtained at a presentation level of 55 dBHL with contralateral masking which is deemed as  excellent. Left ear: 100% was obtained at a presentation level of 70 dBHL with contralateral masking which is deemed as  excellent.   The hearing test results were completed under headphones and re-checked with inserts and results are deemed to be of good to fair reliability. Test technique:  conventional     Impression: Previous audiogram did not have the left conductive component that was obtained today..       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     Past Surgical History:  Procedure Laterality Date   APPENDECTOMY     CESAREAN SECTION     OTHER SURGICAL HISTORY     c-section    Family History   Problem Relation Age of Onset   Stroke Neg Hx        none that she knows of    Seizures Neg Hx      Social Connections: Not on file      Current Outpatient Medications:    buprenorphine -naloxone  (SUBOXONE ) 2-0.5 mg SUBL SL tablet, Place 2 tablets under the tongue daily., Disp: 60 tablet, Rfl: 0   carboxymethylcellulose (REFRESH PLUS) 0.5 % SOLN, Place 1 drop into both eyes daily as needed (dry eyes)., Disp: , Rfl:    diphenhydrAMINE  (BENADRYL ) 25 MG tablet, Take 50 mg by mouth every 6 (six) hours as needed for itching or sleep., Disp: , Rfl:    gabapentin  (NEURONTIN ) 400 MG capsule, TAKE 1 CAPSULE(400 MG) BY MOUTH THREE TIMES DAILY, Disp: 90 capsule, Rfl: 5   ibuprofen  (ADVIL ) 200 MG tablet, Take 400 mg by mouth every 6 (six) hours as needed for moderate pain (pain score 4-6)., Disp: , Rfl:    ketorolac  (TORADOL ) 10 MG tablet, Take 1 tablet (10 mg total) by mouth every 6 (six) hours as needed (pain)., Disp: 20 tablet, Rfl: 0   ofloxacin  (FLOXIN ) 0.3 % OTIC solution, Place 5 drops into the left ear 2 (two) times daily., Disp: 5 mL, Rfl: 0   Vitamin D , Ergocalciferol , (DRISDOL ) 1.25 MG (50000 UNIT) CAPS capsule, Take 1 capsule (50,000 Units total) by mouth every 7 (seven) days., Disp: 8 capsule, Rfl: 2   amoxicillin -clavulanate (AUGMENTIN ) 875-125 MG tablet, Take 1 tablet by mouth 2 (two) times daily. (Patient not taking: Reported on 08/23/2023), Disp: 6 tablet, Rfl: 0   TYLENOL  8 HOUR ARTHRITIS PAIN 650 MG CR tablet, Take 650 mg by mouth every 8 (eight) hours as needed for pain. (Patient not taking: Reported on 08/21/2023), Disp: , Rfl:    Physical Exam:   LMP 07/09/2021 (Approximate)   Pertinent Findings  CN II-XII intact  Bilateral EAC clear and TM intact with well pneumatized middle ear spaces Weber 512: equal Rinne 512: AC > BC b/l  Anterior rhinoscopy: Septum midline; bilateral inferior turbinates with no hypertrophy No lesions of oral cavity/oropharynx; dentition WNL No obviously  palpable neck masses/lymphadenopathy/thyromegaly No respiratory distress or stridor  Seprately Identifiable Procedures:  None  Impression & Plans:  Lindsey [name] is a 53 y.o. female with the following   Left-sided tinnitus-  The patient presents today for follow-up evaluation of left-sided tinnitus.  She is very emotional today, I made very clear to delineate the sound from any pain, she is not having any pain.  She is terribly concerned for the sound she is having.  I reviewed her audiogram, she does have sensorineural hearing loss with a minimal conductive portion to it.  She does have a recent infection, this would not be abnormal.  Given her level of stress I would like to see her back shortly for repeat evaluation.  She is very happy with today's plan and verbalized understanding and agreement to today's plan.   - f/u 3-day follow-up   Thank you for  allowing me the opportunity to care for your patient. Please do not hesitate to contact me should you have any other questions.  Sincerely, Belma Boxer PA-C [facility] Phone: [phone] Fax: [phone]  08/23/2023, 11:29 AM

## 2023-08-25 ENCOUNTER — Ambulatory Visit (INDEPENDENT_AMBULATORY_CARE_PROVIDER_SITE_OTHER): Payer: MEDICAID

## 2023-08-25 ENCOUNTER — Encounter (INDEPENDENT_AMBULATORY_CARE_PROVIDER_SITE_OTHER): Payer: Self-pay

## 2023-08-25 VITALS — BP 119/75 | HR 95

## 2023-08-25 DIAGNOSIS — H9312 Tinnitus, left ear: Secondary | ICD-10-CM | POA: Diagnosis not present

## 2023-08-25 NOTE — Progress Notes (Unsigned)
 Dear Dr. Carlette Cheers, Here is my assessment for our mutual patient, Lindsey [name]. Thank you for allowing me the opportunity to care for your patient. Please do not hesitate to contact me should you have any other questions. Sincerely, Belma Boxer PA-C  Otolaryngology Clinic Note Referring provider: Dr. Carlette Cheers HPI:  Lindsey [name] is a 53 y.o. female kindly referred by Dr. Carlette Cheers   The patient is a 53 year old female seen in our office for follow-up evaluation of left-sided tinnitus.  The patient was last seen in the office on 08/23/2023 below is a recap of that encounter.   The patient is a 53 year old female seen in our office for evaluation of left-sided tinnitus.  The patient was last seen in the office on 08/11/2023.  Below is a recap of that encounter.   The patient is a 53 year old female seen in our office for evaluation of left-sided tinnitus.  The patient notes that she developed left-sided tinnitus on 08/05/2023.  She describes this is high-pitched and consistent, no pulsatile nature.  She followed up in urgent care was told that she had redness and otitis media.  She notes some associated minor nasal congestion, no fever.  She was placed on antibiotics.  She notes that after starting the antibiotics the upper respiratory symptoms resolved as well as the tinnitus.  She denies any ringing today, she denies any neurologic complaints.  She reports her hearing is at its baseline and does not perceive any hearing deficits.  She notes a history of some minimal seasonal allergies.   Update 08/23/2023   Since her last office visit she has had a return of the tinnitus which had previously stopped.  She has become very anxious about this and emotional.  She notes that she is not having any pain in the left ear but is having a high-pitched ringing all the time.  She has been on lying reading about tinnitus which has increased her anxiety.  She denies any associated dizziness, no neurologic deficits, no  drainage, no fever.  She notes the sound machine has been helping  08/25/2023 update  She notes that since her last office visit she continues to have decreased amount of tinnitus in the left ear, she denies any pain, she denies any infectious signs or symptoms.  She is feeling better about the tinnitus at this point. 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     Past Surgical History:  Procedure Laterality Date   APPENDECTOMY     CESAREAN SECTION     OTHER SURGICAL HISTORY     c-section    Family History  Problem Relation Age of Onset   Stroke Neg Hx        none that she knows of    Seizures Neg Hx      Social Connections: Not on file      Current Outpatient Medications:    amoxicillin -clavulanate (AUGMENTIN ) 875-125 MG tablet, Take 1 tablet by mouth 2 (two) times daily for 10 days., Disp: 20 tablet, Rfl: 0   buprenorphine -naloxone  (  SUBOXONE ) 2-0.5 mg SUBL SL tablet, Place 2 tablets under the tongue daily., Disp: 60 tablet, Rfl: 0   carboxymethylcellulose (REFRESH PLUS) 0.5 % SOLN, Place 1 drop into both eyes daily as needed (dry eyes)., Disp: , Rfl:    diphenhydrAMINE  (BENADRYL ) 25 MG tablet, Take 50 mg by mouth every 6 (six) hours as needed for itching or sleep., Disp: , Rfl:    gabapentin  (NEURONTIN ) 400 MG capsule, TAKE 1 CAPSULE(400 MG) BY MOUTH THREE TIMES DAILY, Disp: 90 capsule, Rfl: 5   ibuprofen  (ADVIL ) 200 MG tablet, Take 400 mg by mouth every 6 (six) hours as needed for moderate pain (pain score 4-6)., Disp: , Rfl:    ketorolac  (TORADOL ) 10 MG  tablet, Take 1 tablet (10 mg total) by mouth every 6 (six) hours as needed (pain)., Disp: 20 tablet, Rfl: 0   ofloxacin  (FLOXIN ) 0.3 % OTIC solution, Place 5 drops into the left ear 2 (two) times daily., Disp: 5 mL, Rfl: 0   Vitamin D , Ergocalciferol , (DRISDOL ) 1.25 MG (50000 UNIT) CAPS capsule, Take 1 capsule (50,000 Units total) by mouth every 7 (seven) days., Disp: 8 capsule, Rfl: 2   amoxicillin -clavulanate (AUGMENTIN ) 875-125 MG tablet, Take 1 tablet by mouth 2 (two) times daily. (Patient not taking: Reported on 08/21/2023), Disp: 6 tablet, Rfl: 0   TYLENOL  8 HOUR ARTHRITIS PAIN 650 MG CR tablet, Take 650 mg by mouth every 8 (eight) hours as needed for pain. (Patient not taking: Reported on 08/21/2023), Disp: , Rfl:    Physical Exam:   BP 119/75   Pulse 95   LMP 07/09/2021 (Approximate)   SpO2 92%   Pertinent Findings  CN II-XII intact  Bilateral EAC clear and TM intact with well pneumatized middle ear spaces Weber 512: equal Rinne 512: AC > BC b/l  Anterior rhinoscopy: Septum midline; bilateral inferior turbinates with no hypertrophy No lesions of oral cavity/oropharynx; dentition WNL No obviously palpable neck masses/lymphadenopathy/thyromegaly No respiratory distress or stridor  Seprately Identifiable Procedures:  None  Impression & Plans:  Lindsey [name] is a 53 y.o. female with the following   Left-sided tinnitus-  Symptoms continue to improve with no severe signs or symptoms, no alarming signs or symptoms.  The patient is still nervous about the tinnitus but is happy with the progression.  She would like to be seen 1 last time.  She will follow-up next week.   - f/u 1 week    Thank you for allowing me the opportunity to care for your patient. Please do not hesitate to contact me should you have any other questions.  Sincerely, Belma Boxer PA-C [facility] Phone: [phone] Fax: [phone]  08/25/2023, 10:45 AM

## 2023-08-29 ENCOUNTER — Telehealth: Payer: Self-pay

## 2023-08-29 NOTE — Telephone Encounter (Signed)
 Copied from CRM [id number]. Topic: General - Other >> Aug 29, 2023  3:14 PM Felizardo Hotter wrote: Reason for CRM: Pt is scheduled for 08/31/2023 for insomnia. Pt would like something called into pharmacy until then to get some sleep. Please call pt at [phone]

## 2023-08-30 ENCOUNTER — Telehealth: Payer: Self-pay

## 2023-08-30 NOTE — Telephone Encounter (Signed)
Sent my chart message. KH 

## 2023-08-31 ENCOUNTER — Ambulatory Visit (INDEPENDENT_AMBULATORY_CARE_PROVIDER_SITE_OTHER): Payer: MEDICAID | Admitting: Nurse Practitioner

## 2023-08-31 ENCOUNTER — Encounter: Payer: Self-pay | Admitting: Nurse Practitioner

## 2023-08-31 VITALS — BP 109/60 | HR 88 | Temp 98.6°F | Wt 143.6 lb

## 2023-08-31 DIAGNOSIS — G4700 Insomnia, unspecified: Secondary | ICD-10-CM | POA: Diagnosis not present

## 2023-08-31 DIAGNOSIS — H9202 Otalgia, left ear: Secondary | ICD-10-CM

## 2023-08-31 MED ORDER — TRAZODONE HCL 50 MG PO TABS: 50.0000 mg | ORAL_TABLET | Freq: Every day | ORAL | 2 refills | Status: DC

## 2023-08-31 MED ORDER — KETOROLAC TROMETHAMINE 30 MG/ML IJ SOLN
30.0000 mg | Freq: Once | INTRAMUSCULAR | Status: AC
  Administered 2023-08-31: 30 mg via INTRAMUSCULAR

## 2023-08-31 NOTE — Telephone Encounter (Signed)
 Patient advised.

## 2023-08-31 NOTE — Patient Instructions (Signed)
 1. Insomnia, unspecified type (Primary)  - traZODone  (DESYREL ) 50 MG tablet; Take 1 tablet (50 mg total) by mouth at bedtime.  Dispense: 90 tablet; Refill: 2  2. Left ear pain  - ketorolac  (TORADOL ) 30 MG/ML injection 30 mg

## 2023-08-31 NOTE — Progress Notes (Signed)
 Subjective   Patient ID: Lindsey [name], female    DOB: Jun 19, 1970, 53 y.o.   MRN: [id number]  Chief Complaint  Patient presents with   Depression    Patient stated that she needs some sleeping medication     Referring provider: Paseda, Folashade R, FNP  [name] is a 53 y.o. 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  She was seen here recently for earache to left ear.  She has followed with ENT and does have an appointment with them tomorrow.  She states that she is still having significant ear pain.  We will give Toradol  injection in the office today.  Patient states she has been unable to sleep due to the pain.  We will trial trazodone . Denies f/c/s, n/v/d, hemoptysis, PND, leg swelling Denies chest pain or edema      Allergies  Allergen Reactions   Ketamine  Anxiety and Other (See Comments)    Tachycardia    Immunization History  Administered Date(s) Administered   Influenza, Seasonal, Injecte, Preservative Fre 06/27/2023   Influenza,inj,Quad PF,6+ Mos 03/30/2018, 01/29/2019   Pneumococcal Polysaccharide-23 10/05/2017, 05/20/2021   Tdap 08/02/2022    Tobacco History: Social History   Tobacco Use  Smoking Status Former   Types: Cigars, Cigarettes  Smokeless Tobacco Never   Counseling given: Not Answered   Outpatient Encounter Medications as of 08/31/2023  Medication Sig   amoxicillin -clavulanate (AUGMENTIN ) 875-125 MG tablet Take 1 tablet by mouth 2 (two) times daily.   buprenorphine -naloxone  (SUBOXONE ) 2-0.5 mg SUBL SL tablet Place 2 tablets under  the tongue daily.   carboxymethylcellulose (REFRESH PLUS) 0.5 % SOLN Place 1 drop into both eyes daily as needed (dry eyes).   diphenhydrAMINE  (BENADRYL ) 25 MG tablet Take 50 mg by mouth every 6 (six) hours as needed for itching or sleep.   gabapentin  (NEURONTIN ) 400 MG capsule TAKE 1 CAPSULE(400 MG) BY MOUTH THREE TIMES DAILY   ibuprofen  (ADVIL ) 200 MG tablet Take 400 mg by mouth every 6 (six) hours as needed for moderate pain (pain score 4-6).   ketorolac  (TORADOL ) 10 MG tablet Take 1 tablet (10 mg total) by mouth every 6 (six) hours as needed (pain).   ofloxacin  (FLOXIN ) 0.3 % OTIC solution Place 5 drops into the left ear 2 (two) times daily.   traZODone  (DESYREL ) 50 MG tablet Take 1 tablet (50 mg total) by mouth at bedtime.   Vitamin D , Ergocalciferol , (DRISDOL ) 1.25 MG (50000 UNIT) CAPS capsule Take 1 capsule (50,000 Units total) by mouth every 7 (seven) days.   TYLENOL  8 HOUR ARTHRITIS PAIN 650 MG CR tablet Take 650 mg by mouth every 8 (eight) hours as needed for pain. (Patient not taking: Reported on 08/21/2023)   [DISCONTINUED] amoxicillin -clavulanate (AUGMENTIN ) 875-125 MG tablet Take 1 tablet by mouth 2 (two) times daily for 10 days.   Facility-Administered Encounter Medications as of 08/31/2023  Medication   ketorolac  (TORADOL ) 30 MG/ML injection 30 mg    Review of Systems  Review of Systems  Constitutional: Negative.   HENT: Negative.    Cardiovascular: Negative.   Gastrointestinal: Negative.   Allergic/Immunologic: Negative.   Neurological: Negative.  Objective:   BP 109/60   Pulse 88   Temp 98.6 F (37 C) (Oral)   Wt 143 lb 9.6 oz (65.1 kg)   LMP 07/09/2021 (Approximate)   SpO2 99%   BMI 28.04 kg/m   Wt Readings from Last 5 Encounters:  08/31/23 143 lb 9.6 oz (65.1 kg)  08/21/23 142 lb 9.6 oz (64.7 kg)  08/17/23 151 lb 14.4 oz (68.9 kg)  08/11/23 152 lb (68.9 kg)  08/07/23 154 lb 12.2 oz (70.2 kg)     Physical Exam Vitals and nursing note reviewed.   Constitutional:      General: She is not in acute distress.    Appearance: She is well-developed.  Cardiovascular:     Rate and Rhythm: Normal rate and regular rhythm.  Pulmonary:     Effort: Pulmonary effort is normal.     Breath sounds: Normal breath sounds.  Neurological:     Mental Status: She is alert and oriented to person, place, and time.       Assessment & Plan:   Insomnia, unspecified type -     traZODone  HCl; Take 1 tablet (50 mg total) by mouth at bedtime.  Dispense: 90 tablet; Refill: 2  Left ear pain -     Ketorolac  Tromethamine      No follow-ups on file.   Jerrlyn Morel, NP 08/31/2023

## 2023-09-01 ENCOUNTER — Ambulatory Visit (INDEPENDENT_AMBULATORY_CARE_PROVIDER_SITE_OTHER): Payer: MEDICAID | Admitting: Physician Assistant

## 2023-09-01 ENCOUNTER — Encounter (INDEPENDENT_AMBULATORY_CARE_PROVIDER_SITE_OTHER): Payer: Self-pay

## 2023-09-01 VITALS — HR 102 | Ht 60.0 in | Wt 143.0 lb

## 2023-09-01 DIAGNOSIS — H9312 Tinnitus, left ear: Secondary | ICD-10-CM

## 2023-09-01 NOTE — Progress Notes (Signed)
 Dear Dr. Carlette Cheers, Here is my assessment for our mutual patient, Lindsey [name]. Thank you for allowing me the opportunity to care for your patient. Please do not hesitate to contact me should you have any other questions. Sincerely, Belma Boxer PA-C  Otolaryngology Clinic Note Referring provider: Dr. Carlette Cheers HPI:  Lindsey [name] is a 53 y.o. female kindly referred by Dr. Carlette Cheers   The patient is a 53 year old female seen our office for evaluation of tinnitus.  She was last seen in the office on 08/25/2023.  Below is a recap of that encounter.  The patient is a 53 year old female seen in our office for follow-up evaluation of left-sided tinnitus.  The patient was last seen in the office on 08/23/2023 below is a recap of that encounter.     The patient is a 53 year old female seen in our office for evaluation of left-sided tinnitus.  The patient was last seen in the office on 08/11/2023.  Below is a recap of that encounter.   The patient is a 53 year old female seen in our office for evaluation of left-sided tinnitus.  The patient notes that she developed left-sided tinnitus on 08/05/2023.  She describes this is high-pitched and consistent, no pulsatile nature.  She followed up in urgent care was told that she had redness and otitis media.  She notes some associated minor nasal congestion, no fever.  She was placed on antibiotics.  She notes that after starting the antibiotics the upper respiratory symptoms resolved as well as the tinnitus.  She denies any ringing today, she denies any neurologic complaints.  She reports her hearing is at its baseline and does not perceive any hearing deficits.  She notes a history of some minimal seasonal allergies.   Update 08/23/2023   Since her last office visit she has had a return of the tinnitus which had previously stopped.  She has become very anxious about this and emotional.  She notes that she is not having any pain in the left ear but is having a  high-pitched ringing all the time.  She has been on lying reading about tinnitus which has increased her anxiety.  She denies any associated dizziness, no neurologic deficits, no drainage, no fever.  She notes the sound machine has been helping   08/25/2023 update   She notes that since her last office visit she continues to have decreased amount of tinnitus in the left ear, she denies any pain, she denies any infectious signs or symptoms.  She is feeling better about the tinnitus at this   Update 09/01/2023   Since her last office visit she notes she has been doing well, she only hears the ear ringing off and on at this point.  It is not severe, it is tolerable and she is happy that is moving in the right direction.  She has no signs of infections today.       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     Past Surgical History:  Procedure Laterality Date   APPENDECTOMY  CESAREAN SECTION     OTHER SURGICAL HISTORY     c-section    Family History  Problem Relation Age of Onset   Stroke Neg Hx        none that she knows of    Seizures Neg Hx      Social Connections: Not on file      Current Outpatient Medications:    amoxicillin -clavulanate (AUGMENTIN ) 875-125 MG tablet, Take 1 tablet by mouth 2 (two) times daily., Disp: 6 tablet, Rfl: 0   buprenorphine -naloxone  (SUBOXONE ) 2-0.5 mg SUBL SL tablet, Place 2 tablets under the tongue daily., Disp: 60 tablet, Rfl: 0   carboxymethylcellulose (REFRESH PLUS) 0.5 % SOLN, Place 1 drop into both eyes daily as needed  (dry eyes)., Disp: , Rfl:    diphenhydrAMINE  (BENADRYL ) 25 MG tablet, Take 50 mg by mouth every 6 (six) hours as needed for itching or sleep., Disp: , Rfl:    gabapentin  (NEURONTIN ) 400 MG capsule, TAKE 1 CAPSULE(400 MG) BY MOUTH THREE TIMES DAILY, Disp: 90 capsule, Rfl: 5   ibuprofen  (ADVIL ) 200 MG tablet, Take 400 mg by mouth every 6 (six) hours as needed for moderate pain (pain score 4-6)., Disp: , Rfl:    ketorolac  (TORADOL ) 10 MG tablet, Take 1 tablet (10 mg total) by mouth every 6 (six) hours as needed (pain)., Disp: 20 tablet, Rfl: 0   ofloxacin  (FLOXIN ) 0.3 % OTIC solution, Place 5 drops into the left ear 2 (two) times daily., Disp: 5 mL, Rfl: 0   traZODone  (DESYREL ) 50 MG tablet, Take 1 tablet (50 mg total) by mouth at bedtime., Disp: 90 tablet, Rfl: 2   TYLENOL  8 HOUR ARTHRITIS PAIN 650 MG CR tablet, Take 650 mg by mouth every 8 (eight) hours as needed for pain., Disp: , Rfl:    Vitamin D , Ergocalciferol , (DRISDOL ) 1.25 MG (50000 UNIT) CAPS capsule, Take 1 capsule (50,000 Units total) by mouth every 7 (seven) days., Disp: 8 capsule, Rfl: 2   Physical Exam:   Ht 5' (1.524 m)   Wt 143 lb (64.9 kg)   LMP 07/09/2021 (Approximate)   BMI 27.93 kg/m   Pertinent Findings  CN II-XII intact Bilateral EAC clear and TM intact with well pneumatized middle ear spaces Anterior rhinoscopy: Septum midline;  No lesions of oral cavity/oropharynx; dentition WNL No obviously palpable neck masses/lymphadenopathy/thyromegaly No respiratory distress or stridor  Seprately Identifiable Procedures:  None  Impression & Plans:  Lindsey [name] is a 53 y.o. female with the following   Left-sided tinnitus-  Symptoms nearly resolved at this point, they are intermittent.  This is likely secondary to previous infection which has resolved.  At this point the patient requires no follow-up evaluation.  I am happy to see her in the office at any point if she develops any new or worsening signs or symptoms.  She  was given return precautions.  Verbalized understanding and agreement to today's plan.   - f/u PRN   Thank you for allowing me the opportunity to care for your patient. Please do not hesitate to contact me should you have any other questions.  Sincerely, Belma Boxer PA-C [facility] Phone: [phone] Fax: [phone]  09/01/2023, 11:57 AM

## 2023-09-04 ENCOUNTER — Emergency Department (HOSPITAL_COMMUNITY): Payer: MEDICAID

## 2023-09-04 ENCOUNTER — Other Ambulatory Visit: Payer: Self-pay

## 2023-09-04 ENCOUNTER — Encounter (HOSPITAL_COMMUNITY): Payer: Self-pay

## 2023-09-04 ENCOUNTER — Emergency Department (HOSPITAL_COMMUNITY)
Admission: EM | Admit: 2023-09-04 | Discharge: 2023-09-04 | Disposition: A | Discharge status: Home / Self Care | Payer: MEDICAID | Attending: Emergency Medicine | Admitting: Emergency Medicine

## 2023-09-04 DIAGNOSIS — R519 Headache, unspecified: Secondary | ICD-10-CM | POA: Diagnosis present

## 2023-09-04 LAB — CBC WITH DIFFERENTIAL/PLATELET
Abs Immature Granulocytes: 0.03 10*3/uL (ref 0.00–0.07)
Basophils Absolute: 0.1 10*3/uL (ref 0.0–0.1)
Basophils Relative: 1 %
Eosinophils Absolute: 0.9 10*3/uL — ABNORMAL HIGH (ref 0.0–0.5)
Eosinophils Relative: 9 %
HCT: 30.5 % — ABNORMAL LOW (ref 36.0–46.0)
Hemoglobin: 10.9 g/dL — ABNORMAL LOW (ref 12.0–15.0)
Immature Granulocytes: 0 %
Lymphocytes Relative: 39 %
Lymphs Abs: 3.8 10*3/uL (ref 0.7–4.0)
MCH: 30.5 pg (ref 26.0–34.0)
MCHC: 35.7 g/dL (ref 30.0–36.0)
MCV: 85.4 fL (ref 80.0–100.0)
Monocytes Absolute: 0.6 10*3/uL (ref 0.1–1.0)
Monocytes Relative: 6 %
Neutro Abs: 4.4 10*3/uL (ref 1.7–7.7)
Neutrophils Relative %: 45 %
Platelets: 421 10*3/uL — ABNORMAL HIGH (ref 150–400)
RBC: 3.57 MIL/uL — ABNORMAL LOW (ref 3.87–5.11)
RDW: 14.4 % (ref 11.5–15.5)
WBC: 9.7 10*3/uL (ref 4.0–10.5)
nRBC: 0 % (ref 0.0–0.2)

## 2023-09-04 LAB — COMPREHENSIVE METABOLIC PANEL WITH GFR
ALT: 15 U/L (ref 0–44)
AST: 18 U/L (ref 15–41)
Albumin: 4.4 g/dL (ref 3.5–5.0)
Alkaline Phosphatase: 32 U/L — ABNORMAL LOW (ref 38–126)
Anion gap: 8 (ref 5–15)
BUN: 8 mg/dL (ref 6–20)
CO2: 27 mmol/L (ref 22–32)
Calcium: 9.4 mg/dL (ref 8.9–10.3)
Chloride: 107 mmol/L (ref 98–111)
Creatinine, Ser: 0.9 mg/dL (ref 0.44–1.00)
GFR, Estimated: >60 mL/min (ref >60)
Glucose, Bld: 115 mg/dL — ABNORMAL HIGH (ref 70–99)
Potassium: 3.8 mmol/L (ref 3.5–5.1)
Sodium: 142 mmol/L (ref 135–145)
Total Bilirubin: 1.6 mg/dL — ABNORMAL HIGH (ref 0.0–1.2)
Total Protein: 7.6 g/dL (ref 6.5–8.1)

## 2023-09-04 LAB — RETICULOCYTES
Immature Retic Fract: 23.4 % — ABNORMAL HIGH (ref 2.3–15.9)
RBC.: 3.56 MIL/uL — ABNORMAL LOW (ref 3.87–5.11)
Retic Count, Absolute: 106.8 10*3/uL (ref 19.0–186.0)
Retic Ct Pct: 3 % (ref 0.4–3.1)

## 2023-09-04 MED ORDER — DIPHENHYDRAMINE HCL 50 MG/ML IJ SOLN
25.0000 mg | Freq: Once | INTRAMUSCULAR | Status: AC
  Administered 2023-09-04: 25 mg via INTRAVENOUS
  Filled 2023-09-04: qty 1

## 2023-09-04 MED ORDER — KETOROLAC TROMETHAMINE 30 MG/ML IJ SOLN
30.0000 mg | Freq: Once | INTRAMUSCULAR | Status: AC
  Administered 2023-09-04: 30 mg via INTRAVENOUS
  Filled 2023-09-04: qty 1

## 2023-09-04 MED ORDER — SODIUM CHLORIDE 0.9 % IV BOLUS
500.0000 mL | Freq: Once | INTRAVENOUS | Status: AC
  Administered 2023-09-04: 500 mL via INTRAVENOUS

## 2023-09-04 MED ORDER — MIDAZOLAM HCL 2 MG/2ML IJ SOLN
1.0000 mg | Freq: Once | INTRAMUSCULAR | Status: AC
  Administered 2023-09-04: 1 mg via INTRAVENOUS
  Filled 2023-09-04: qty 2

## 2023-09-04 MED ORDER — IOHEXOL 350 MG/ML SOLN
100.0000 mL | Freq: Once | INTRAVENOUS | Status: AC | PRN
  Administered 2023-09-04: 100 mL via INTRAVENOUS

## 2023-09-04 MED ORDER — PROCHLORPERAZINE EDISYLATE 10 MG/2ML IJ SOLN
10.0000 mg | Freq: Once | INTRAMUSCULAR | Status: AC
  Administered 2023-09-04: 10 mg via INTRAVENOUS
  Filled 2023-09-04: qty 2

## 2023-09-04 NOTE — ED Provider Notes (Signed)
 [place] EMERGENCY DEPARTMENT AT [facility] Provider Note   CSN: [id number] Arrival date & time: 09/04/23  0411     History  Chief Complaint  Patient presents with   Headache   Sickle Cell Pain Crisis    Lindsey [name] is a 53 y.o. female.  Presents to the emerged department for evaluation of headache.  Patient reports that the headache began at around midnight tonight.  Prior to that she had some shoulder pain which resolved after she took Advil .  Patient reports that she does not normally have headaches.  Patient without vision change.  She has had nausea, no vomiting.       Home Medications Prior to Admission medications   Medication Sig Start Date End Date Taking? Authorizing Provider  amoxicillin -clavulanate (AUGMENTIN ) 875-125 MG tablet Take 1 tablet by mouth 2 (two) times daily. 08/09/23   Ijaola, Onyeje M, NP  buprenorphine -naloxone  (SUBOXONE ) 2-0.5 mg SUBL SL tablet Place 2 tablets under the tongue daily. 08/16/23   Nichols, Tonya S, NP  carboxymethylcellulose (REFRESH PLUS) 0.5 % SOLN Place 1 drop into both eyes daily as needed (dry eyes).    [provider]  diphenhydrAMINE  (BENADRYL ) 25 MG tablet Take 50 mg by mouth every 6 (six) hours as needed for itching or sleep.    [provider]  gabapentin  (NEURONTIN ) 400 MG capsule TAKE 1 CAPSULE(400 MG) BY MOUTH THREE TIMES DAILY 03/13/23   Sigurd Driver, FNP  ibuprofen  (ADVIL ) 200 MG tablet Take 400 mg by mouth every 6 (six) hours as needed for moderate pain (pain score 4-6).    [provider]  ketorolac  (TORADOL ) 10 MG tablet Take 1 tablet (10 mg total) by mouth every 6 (six) hours as needed (pain). 03/31/23   Piontek, Cleveland Dales, MD  ofloxacin  (FLOXIN ) 0.3 % OTIC solution Place 5 drops into the left ear 2 (two) times daily. 08/21/23   Jerrlyn Morel, NP  traZODone  (DESYREL ) 50 MG tablet Take 1 tablet (50 mg total) by mouth at bedtime. 08/31/23   Jerrlyn Morel, NP  TYLENOL  8 HOUR  ARTHRITIS PAIN 650 MG CR tablet Take 650 mg by mouth every 8 (eight) hours as needed for pain.    [provider]  Vitamin D , Ergocalciferol , (DRISDOL ) 1.25 MG (50000 UNIT) CAPS capsule Take 1 capsule (50,000 Units total) by mouth every 7 (seven) days. 06/28/23   Paseda, Folashade R, FNP      Allergies    Ketamine     Review of Systems   Review of Systems  Physical Exam Updated Vital Signs BP (!) 136/98   Pulse 67   Temp 97.8 F (36.6 C) (Oral)   Resp 20   Ht 5' (1.524 m)   Wt 64.9 kg   LMP 07/09/2021 (Approximate)   SpO2 100%   BMI 27.93 kg/m  Physical Exam Vitals and nursing note reviewed.  Constitutional:      General: She is not in acute distress.    Appearance: She is well-developed.  HENT:     Head: Normocephalic and atraumatic.     Mouth/Throat:     Mouth: Mucous membranes are moist.  Eyes:     General: Vision grossly intact. Gaze aligned appropriately.     Extraocular Movements: Extraocular movements intact.     Conjunctiva/sclera: Conjunctivae normal.  Cardiovascular:     Rate and Rhythm: Normal rate and regular rhythm.     Pulses: Normal pulses.     Heart sounds: Normal heart sounds, S1 normal  and S2 normal. No murmur heard.    No friction rub. No gallop.  Pulmonary:     Effort: Pulmonary effort is normal. No respiratory distress.     Breath sounds: Normal breath sounds.  Abdominal:     General: Bowel sounds are normal.     Palpations: Abdomen is soft.     Tenderness: There is no abdominal tenderness. There is no guarding or rebound.     Hernia: No hernia is present.  Musculoskeletal:        General: No swelling.     Cervical back: Full passive range of motion without pain, normal range of motion and neck supple. No spinous process tenderness or muscular tenderness. Normal range of motion.     Right lower leg: No edema.     Left lower leg: No edema.  Skin:    General: Skin is warm and dry.     Capillary Refill: Capillary refill takes less than  2 seconds.     Findings: No ecchymosis, erythema, rash or wound.  Neurological:     General: No focal deficit present.     Mental Status: She is alert and oriented to person, place, and time.     GCS: GCS eye subscore is 4. GCS verbal subscore is 5. GCS motor subscore is 6.     Cranial Nerves: Cranial nerves 2-12 are intact.     Sensory: Sensation is intact.     Motor: Motor function is intact.     Coordination: Coordination is intact.  Psychiatric:        Attention and Perception: Attention normal.        Mood and Affect: Mood normal.        Speech: Speech normal.        Behavior: Behavior normal.     ED Results / Procedures / Treatments   Labs (all labs ordered are listed, but only abnormal results are displayed) Labs Reviewed  COMPREHENSIVE METABOLIC PANEL WITH GFR - Abnormal; Notable for the following components:      Result Value   Glucose, Bld 115 (*)    Alkaline Phosphatase 32 (*)    Total Bilirubin 1.6 (*)    All other components within normal limits  CBC WITH DIFFERENTIAL/PLATELET - Abnormal; Notable for the following components:   RBC 3.57 (*)    Hemoglobin 10.9 (*)    HCT 30.5 (*)    Platelets 421 (*)    Eosinophils Absolute 0.9 (*)    All other components within normal limits  RETICULOCYTES - Abnormal; Notable for the following components:   RBC. 3.56 (*)    Immature Retic Fract 23.4 (*)    All other components within normal limits    EKG None  Radiology CT Angio Head W or Wo Contrast Result Date: 09/04/2023 CLINICAL DATA:  Headache, sudden and severe EXAM: CT ANGIOGRAPHY HEAD TECHNIQUE: Multidetector CT imaging of the head was performed using the standard protocol during bolus administration of intravenous contrast. Multiplanar CT image reconstructions and MIPs were obtained to evaluate the vascular anatomy. RADIATION DOSE REDUCTION: This exam was performed according to the departmental dose-optimization program which includes automated exposure control,  adjustment of the mA and/or kV according to patient size and/or use of iterative reconstruction technique. CONTRAST:  OMNIPAQUE  IOHEXOL  350 MG/ML SOLN COMPARISON:  Brain MRI 08/17/2023 FINDINGS: CT HEAD Brain: No evidence of acute infarction, hemorrhage, hydrocephalus, extra-axial collection or mass lesion/mass effect. Vascular: No hyperdense vessel or unexpected calcification. Skull: Normal. Negative for fracture  or focal lesion. Sinuses: Unremarkable CTA HEAD Anterior circulation: Vessels are smoothly contoured and widely patent. No branch occlusion, beading, aneurysm, or vascular malformation. Posterior circulation: Vessels are smoothly contoured and widely patent. 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 Venous sinuses: Unremarkable Anatomic variants: As above. 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 IMPRESSION: No emergent finding or explanation for headache. Electronically Signed   By: Ronnette Coke M.D.   On: 09/04/2023 06:43    Procedures Procedures    Medications Ordered in ED Medications  sodium chloride  0.9 % bolus 500 mL (500 mLs Intravenous New Bag/Given 09/04/23 0452)  ketorolac  (TORADOL ) 30 MG/ML injection 30 mg (30 mg Intravenous Given 09/04/23 0451)  prochlorperazine (COMPAZINE) injection 10 mg (10 mg Intravenous Given 09/04/23 0451)  diphenhydrAMINE  (BENADRYL ) injection 25 mg (25 mg Intravenous Given 09/04/23 0450)  iohexol  (OMNIPAQUE ) 350 MG/ML injection 100 mL (100 mLs Intravenous Contrast Given 09/04/23 0621)  midazolam (VERSED) injection 1 mg (1 mg Intravenous Given 09/04/23 0605)    ED Course/ Medical Decision Making/ A&P                                 Medical Decision Making Amount and/or Complexity of Data Reviewed Labs: ordered. Radiology: ordered.  Risk Prescription drug management.   Differential Diagnosis considered includes, but not limited to: Hypertensive emergencies,  Idiopathic intracranial hypertension, Space occupying lesions (tumors, abscesses, cysts), Acute hydrocephalus, Dural sinus thrombosis, Intracranial hemorrhage, Cerebrovascular accident or stroke, Meningitis and encephalitis, Migraine HA, Tension HA.  To the emergency department for evaluation of headache.  Patient reports diffuse posterior headache that began approximately 4 hours before coming to the ED.  No neurologic deficit on exam.  Patient sent to radiology for CT angiography of head.  No acute abnormality noted.        Final Clinical Impression(s) / ED Diagnoses Final diagnoses:  Bad headache    Rx / DC Orders ED Discharge Orders     None         Keysi Oelkers, Marine Sia, MD 09/04/23 [phone]

## 2023-09-04 NOTE — ED Notes (Signed)
 Patient transported to CT

## 2023-09-04 NOTE — ED Triage Notes (Signed)
 Pt came in for a headache and nausea since 0015. Pt took advil  with minimal relief for her shoulder, then her head started to hurt.  Hx sickle cell.

## 2023-09-04 NOTE — ED Notes (Addendum)
 Pt returned to room. Unable to complete CT. Pt complaint of being too anxious for scan to be performed at this time. Provider notified.

## 2023-09-15 ENCOUNTER — Other Ambulatory Visit: Payer: Self-pay | Admitting: Family Medicine

## 2023-09-15 DIAGNOSIS — G894 Chronic pain syndrome: Secondary | ICD-10-CM

## 2023-09-15 DIAGNOSIS — D5700 Hb-SS disease with crisis, unspecified: Secondary | ICD-10-CM

## 2023-09-25 ENCOUNTER — Ambulatory Visit: Payer: Self-pay | Admitting: Nurse Practitioner

## 2023-09-27 IMAGING — CR DG TIBIA/FIBULA 2V*R*
4 series · 4 of 4 positions shown · non-contrast
Comparison: 10/26/2020

CLINICAL DATA: Right leg pain for 1 week.  Sickle cell disease.

EXAM:
RIGHT TIBIA AND FIBULA - 2 VIEW

[x tib-fib ap right (1 of 2)]
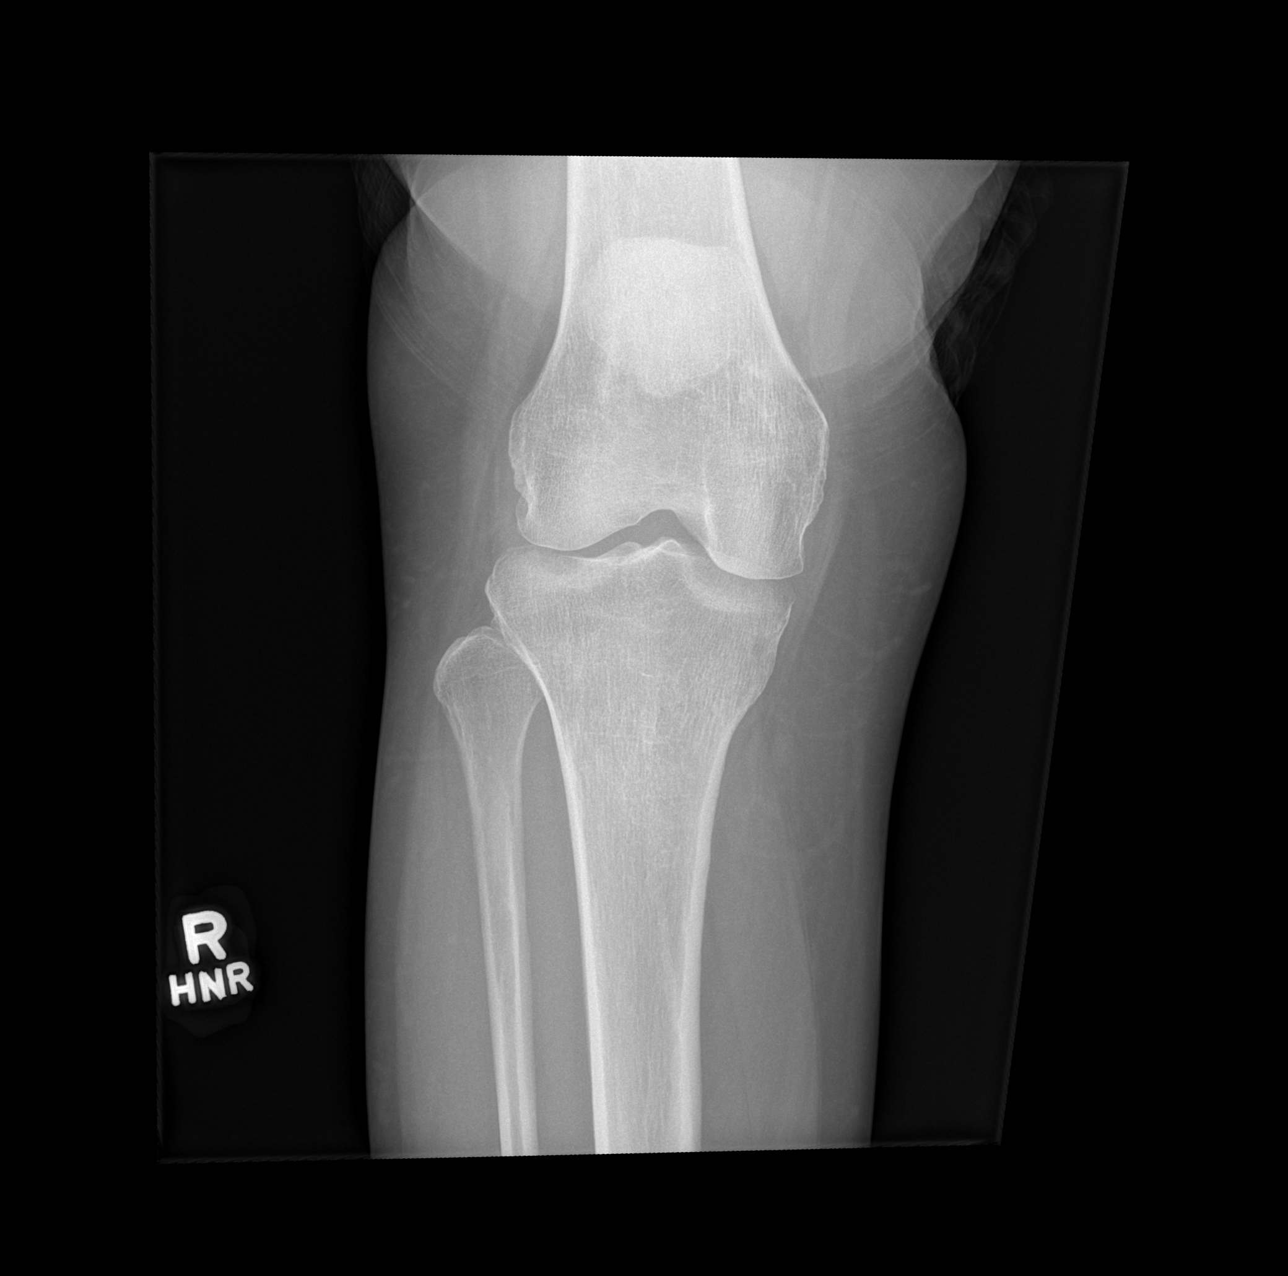

[x tib-fib ap right (2 of 2)]
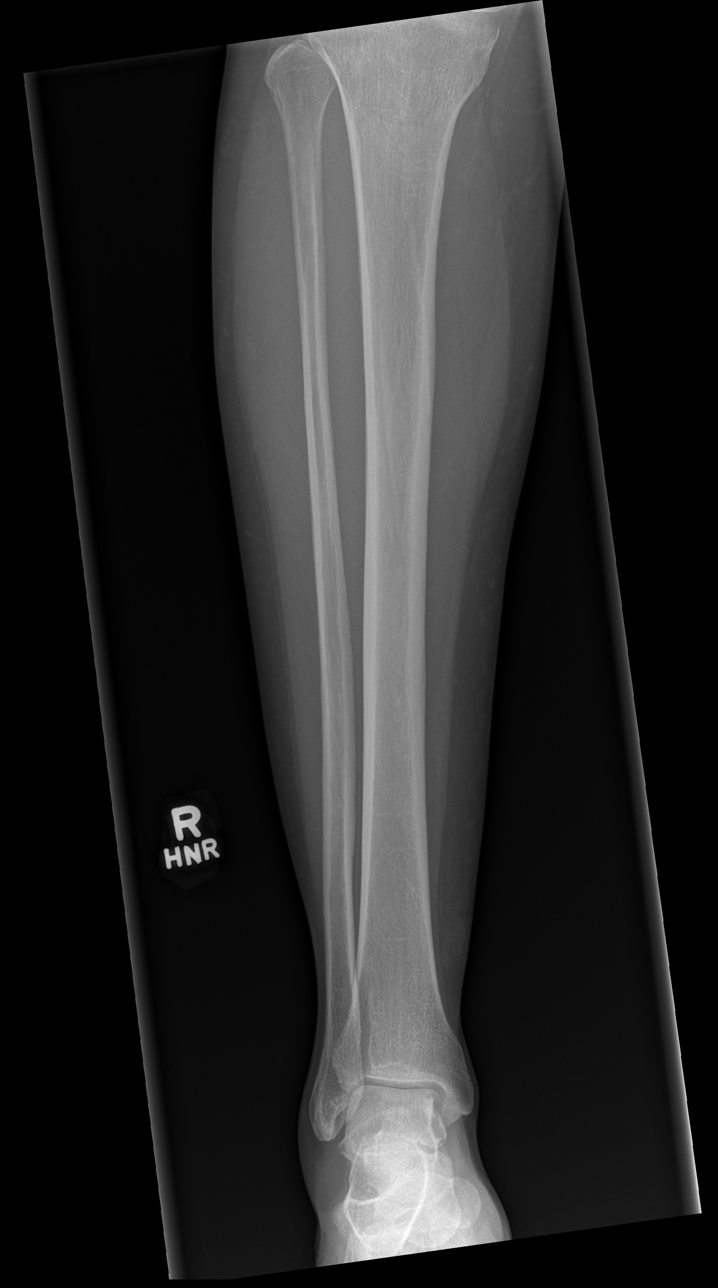

[x tib-fib lat right (1 of 2)]
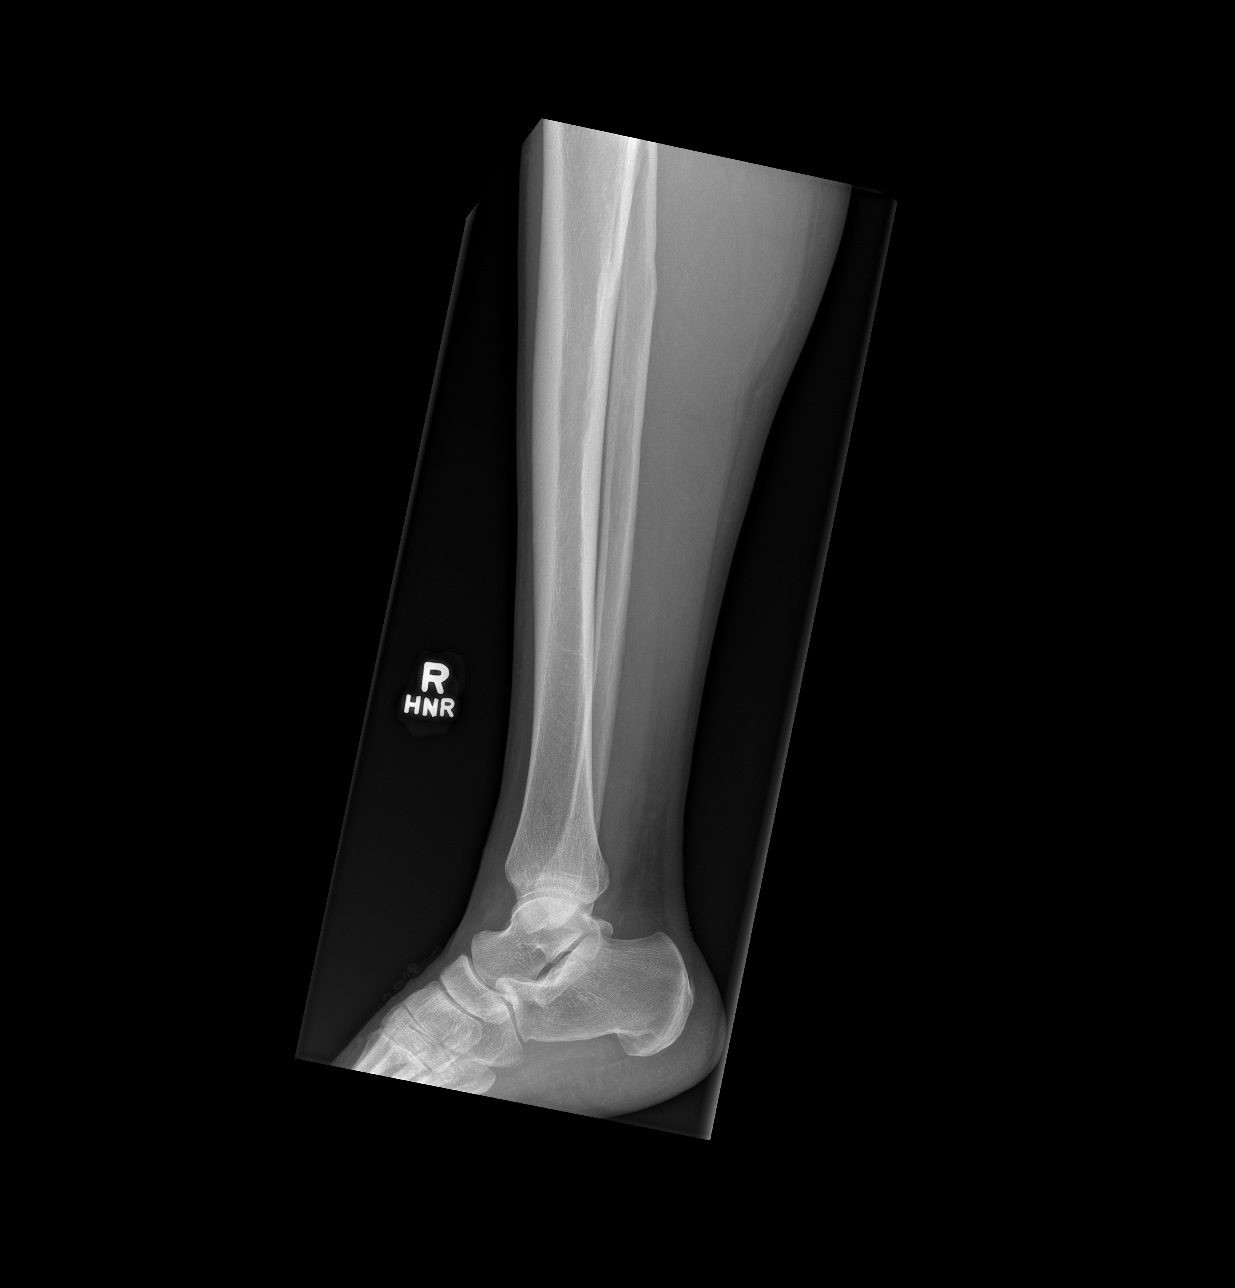

[x tib-fib lat right (2 of 2)]
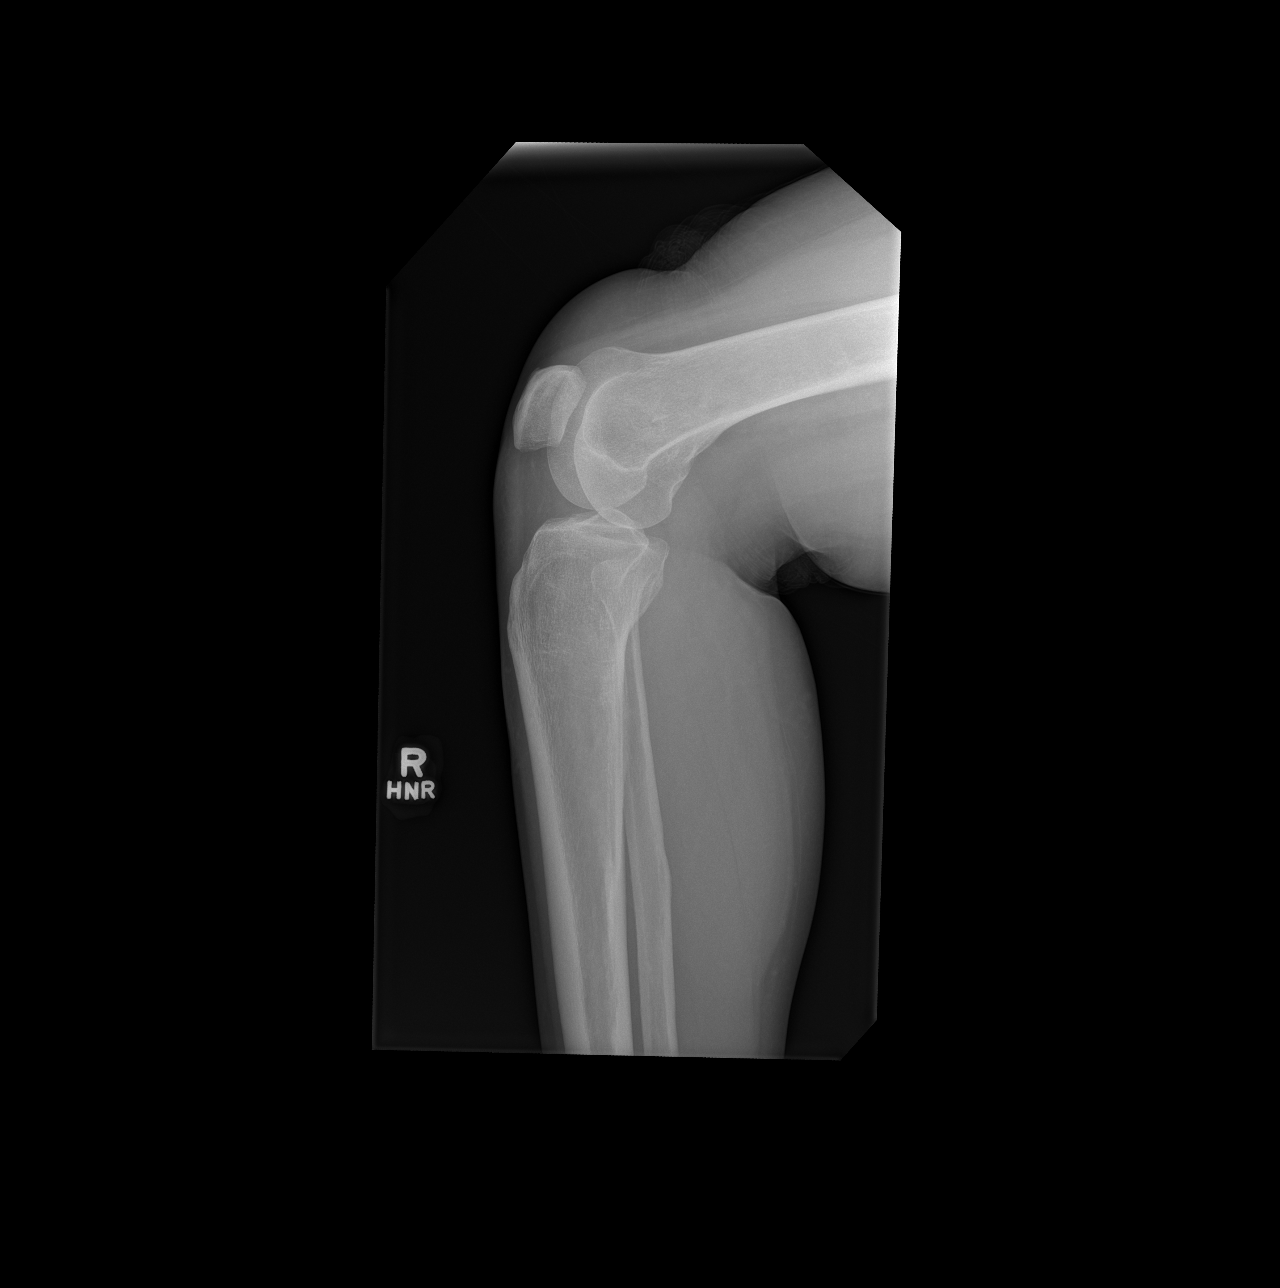

[4 of 4 positions shown; findings below may reference images not displayed]

FINDINGS: There is no evidence of fracture or other focal bone lesions. Soft
tissues are unremarkable.
IMPRESSION: Negative.

## 2023-10-09 ENCOUNTER — Other Ambulatory Visit: Payer: Self-pay

## 2023-10-09 ENCOUNTER — Encounter (HOSPITAL_COMMUNITY): Payer: Self-pay | Admitting: Emergency Medicine

## 2023-10-09 ENCOUNTER — Emergency Department (HOSPITAL_COMMUNITY)
Admission: EM | Admit: 2023-10-09 | Discharge: 2023-10-09 | Disposition: A | Discharge status: Home / Self Care | Payer: MEDICAID | Attending: Emergency Medicine | Admitting: Emergency Medicine

## 2023-10-09 DIAGNOSIS — D57219 Sickle-cell/Hb-C disease with crisis, unspecified: Secondary | ICD-10-CM | POA: Diagnosis not present

## 2023-10-09 DIAGNOSIS — D5700 Hb-SS disease with crisis, unspecified: Secondary | ICD-10-CM

## 2023-10-09 DIAGNOSIS — M545 Low back pain, unspecified: Secondary | ICD-10-CM | POA: Diagnosis present

## 2023-10-09 LAB — CBC WITH DIFFERENTIAL/PLATELET
Abs Immature Granulocytes: 0.05 10*3/uL (ref 0.00–0.07)
Basophils Absolute: 0.1 10*3/uL (ref 0.0–0.1)
Basophils Relative: 1 %
Eosinophils Absolute: 0.3 10*3/uL (ref 0.0–0.5)
Eosinophils Relative: 3 %
HCT: 29.2 % — ABNORMAL LOW (ref 36.0–46.0)
Hemoglobin: 10.5 g/dL — ABNORMAL LOW (ref 12.0–15.0)
Immature Granulocytes: 1 %
Lymphocytes Relative: 41 %
Lymphs Abs: 4.1 10*3/uL — ABNORMAL HIGH (ref 0.7–4.0)
MCH: 30.6 pg (ref 26.0–34.0)
MCHC: 36 g/dL (ref 30.0–36.0)
MCV: 85.1 fL (ref 80.0–100.0)
Monocytes Absolute: 0.6 10*3/uL (ref 0.1–1.0)
Monocytes Relative: 6 %
Neutro Abs: 4.8 10*3/uL (ref 1.7–7.7)
Neutrophils Relative %: 48 %
Platelets: 403 10*3/uL — ABNORMAL HIGH (ref 150–400)
RBC: 3.43 MIL/uL — ABNORMAL LOW (ref 3.87–5.11)
RDW: 14.7 % (ref 11.5–15.5)
WBC: 9.9 10*3/uL (ref 4.0–10.5)
nRBC: 0.3 % — ABNORMAL HIGH (ref 0.0–0.2)

## 2023-10-09 LAB — BASIC METABOLIC PANEL WITH GFR
Anion gap: 10 (ref 5–15)
BUN: 14 mg/dL (ref 6–20)
CO2: 23 mmol/L (ref 22–32)
Calcium: 9.2 mg/dL (ref 8.9–10.3)
Chloride: 107 mmol/L (ref 98–111)
Creatinine, Ser: 0.87 mg/dL (ref 0.44–1.00)
GFR, Estimated: >60 mL/min (ref >60)
Glucose, Bld: 91 mg/dL (ref 70–99)
Potassium: 4.2 mmol/L (ref 3.5–5.1)
Sodium: 140 mmol/L (ref 135–145)

## 2023-10-09 LAB — RETICULOCYTES
Immature Retic Fract: 28.3 % — ABNORMAL HIGH (ref 2.3–15.9)
RBC.: 3.37 MIL/uL — ABNORMAL LOW (ref 3.87–5.11)
Retic Count, Absolute: 95.7 10*3/uL (ref 19.0–186.0)
Retic Ct Pct: 2.8 % (ref 0.4–3.1)

## 2023-10-09 MED ORDER — HYDROMORPHONE HCL 1 MG/ML IJ SOLN: 0.5000 mg | INTRAMUSCULAR | Status: AC

## 2023-10-09 MED ORDER — DIPHENHYDRAMINE HCL 50 MG/ML IJ SOLN
12.5000 mg | Freq: Once | INTRAMUSCULAR | Status: DC
  Filled 2023-10-09: qty 0.25

## 2023-10-09 MED ORDER — SODIUM CHLORIDE 0.45 % IV SOLN
INTRAVENOUS | Status: DC
Start: 2023-10-09 — End: 2023-10-10

## 2023-10-09 MED ORDER — KETOROLAC TROMETHAMINE 15 MG/ML IJ SOLN
15.0000 mg | INTRAMUSCULAR | Status: AC
  Administered 2023-10-09: 15 mg via INTRAVENOUS
  Filled 2023-10-09: qty 1

## 2023-10-09 MED ORDER — ACETAMINOPHEN 325 MG PO TABS: 975.0000 mg | ORAL_TABLET | Freq: Once | ORAL | Status: DC

## 2023-10-09 NOTE — ED Triage Notes (Signed)
 Patient c/o Sickle crisis pain x1 day. Patient c/o 10/10 lower back pain radiating to her left leg. Patient report she don't have any PRN pain meds at home.  Patient denies N/V.

## 2023-10-09 NOTE — ED Provider Notes (Signed)
 [facility] AT [facility] Provider Note   CSN: [id number] Arrival date & time: 10/09/23  1744     History  Chief Complaint  Patient presents with   Sickle Cell Pain Crisis    Lindsey [name] is a 53 y.o. female.  53 year old female with prior medical history as detailed below presents for evaluation.  Patient complains of left leg and low back pain.  This is consistent with prior sickle cell crises per her report.  She reports pain began over the course the last 24 hours.  She takes OTC medication such as Tylenol  or Advil  at home.  This did not control her pain.  She denies fever.  She denies nausea or vomiting.  She denies chest pain or shortness of breath.  The history is provided by the patient.       Home Medications Prior to Admission medications   Medication Sig Start Date End Date Taking? Authorizing Provider  amoxicillin -clavulanate (AUGMENTIN ) 875-125 MG tablet Take 1 tablet by mouth 2 (two) times daily. 08/09/23   Ijaola, Onyeje M, NP  buprenorphine -naloxone  (SUBOXONE ) 2-0.5 mg SUBL SL tablet Place 2 tablets under the tongue daily. 08/16/23   Nichols, Tonya S, NP  carboxymethylcellulose (REFRESH PLUS) 0.5 % SOLN Place 1 drop into both eyes daily as needed (dry eyes).    [provider]  diphenhydrAMINE  (BENADRYL ) 25 MG tablet Take 50 mg by mouth every 6 (six) hours as needed for itching or sleep.    [provider]  gabapentin  (NEURONTIN ) 400 MG capsule TAKE 1 CAPSULE(400 MG) BY MOUTH THREE TIMES DAILY 09/18/23   Paseda, Folashade R, FNP  ibuprofen  (ADVIL ) 200 MG tablet Take 400 mg by mouth every 6 (six) hours as needed for moderate pain (pain score 4-6).    [provider]  ketorolac  (TORADOL ) 10 MG tablet Take 1 tablet (10 mg total) by mouth every 6 (six) hours as needed (pain). 03/31/23   Piontek, Cleveland Dales, MD  ofloxacin  (FLOXIN ) 0.3 % OTIC solution Place 5 drops into the left ear 2 (two) times daily. 08/21/23    Jerrlyn Morel, NP  traZODone  (DESYREL ) 50 MG tablet Take 1 tablet (50 mg total) by mouth at bedtime. 08/31/23   Jerrlyn Morel, NP  TYLENOL  8 HOUR ARTHRITIS PAIN 650 MG CR tablet Take 650 mg by mouth every 8 (eight) hours as needed for pain.    [provider]  Vitamin D , Ergocalciferol , (DRISDOL ) 1.25 MG (50000 UNIT) CAPS capsule Take 1 capsule (50,000 Units total) by mouth every 7 (seven) days. 06/28/23   Paseda, Folashade R, FNP      Allergies    Ketamine     Review of Systems   Review of Systems  All other systems reviewed and are negative.   Physical Exam Updated Vital Signs BP 120/78 (BP Location: Left Arm)   Pulse 77   Temp 98.9 F (37.2 C) (Oral)   Resp 18   Ht 5' (1.524 m)   Wt 65 kg   LMP 07/09/2021 (Approximate)   SpO2 99%   BMI 27.99 kg/m  Physical Exam Vitals and nursing note reviewed.  Constitutional:      General: She is not in acute distress.    Appearance: Normal appearance. She is well-developed.  HENT:     Head: Normocephalic and atraumatic.  Eyes:     Conjunctiva/sclera: Conjunctivae normal.     Pupils: Pupils are equal, round, and reactive to light.  Cardiovascular:     Rate  and Rhythm: Normal rate and regular rhythm.     Heart sounds: Normal heart sounds.  Pulmonary:     Effort: Pulmonary effort is normal. No respiratory distress.     Breath sounds: Normal breath sounds.  Abdominal:     General: There is no distension.     Palpations: Abdomen is soft.     Tenderness: There is no abdominal tenderness.  Musculoskeletal:        General: No deformity. Normal range of motion.     Cervical back: Normal range of motion and neck supple.  Skin:    General: Skin is warm and dry.  Neurological:     General: No focal deficit present.     Mental Status: She is alert and oriented to person, place, and time.     ED Results / Procedures / Treatments   Labs (all labs ordered are listed, but only abnormal results are displayed) Labs Reviewed   CBC WITH DIFFERENTIAL/PLATELET - Abnormal; Notable for the following components:      Result Value   RBC 3.43 (*)    Hemoglobin 10.5 (*)    HCT 29.2 (*)    Platelets 403 (*)    nRBC 0.3 (*)    Lymphs Abs 4.1 (*)    All other components within normal limits  RETICULOCYTES - Abnormal; Notable for the following components:   RBC. 3.37 (*)    Immature Retic Fract 28.3 (*)    All other components within normal limits  BASIC METABOLIC PANEL WITH GFR    EKG None  Radiology No results found.  Procedures Procedures    Medications Ordered in ED Medications  acetaminophen  (TYLENOL ) tablet 975 mg (975 mg Oral Patient Refused/Not Given 10/09/23 1854)  0.45 % sodium chloride  infusion (has no administration in time range)  ketorolac  (TORADOL ) 15 MG/ML injection 15 mg (has no administration in time range)  HYDROmorphone  (DILAUDID ) injection 0.5 mg (has no administration in time range)  diphenhydrAMINE  (BENADRYL ) 12.5 mg in sodium chloride  0.9 % 50 mL IVPB (has no administration in time range)    ED Course/ Medical Decision Making/ A&P                                 Medical Decision Making Risk Prescription drug management.    Medical Screen Complete  This patient presented to the ED with complaint of sickle cell painful crisis.  This complaint involves an extensive number of treatment options. The initial differential diagnosis includes, but is not limited to, sickle cell pain crisis, metabolic abnormality, infection, etc.  This presentation is: Acute, Chronic, Self-Limited, Previously Undiagnosed, Uncertain Prognosis, Complicated, Systemic Symptoms, and Threat to Life/Bodily Function  Patient with longstanding history of sickle cell.  Patient reports constellation of symptoms consistent with prior sickle cell pain crisis.  Screening labs obtained are without significant acute abnormality.  Patient feels improved after IV fluids, Toradol , Dilaudid .    Patient desires  discharge home now.  Importance of close followup stressed.  Strict return precautions given understood.  Additional history obtained:  External records from outside sources obtained and reviewed including prior ED visits and prior Inpatient records.    Problem List / ED Course:  Sickle cell painful crisis   Disposition:  After consideration of the diagnostic results and the patients response to treatment, I feel that the patent would benefit from close outpatient follow-up.          Final  Clinical Impression(s) / ED Diagnoses Final diagnoses:  Sickle cell pain crisis [facility])    Rx / DC Orders ED Discharge Orders     None         Burnette Carte, MD 10/09/23 2053

## 2023-10-09 NOTE — Discharge Instructions (Signed)
 Return for any problem.  ?

## 2023-10-09 NOTE — ED Provider Triage Note (Signed)
 Emergency Medicine Provider Triage Evaluation Note  Lindsey [name] , a 53 y.o. female  was evaluated in triage.  Pt complains of left leg and back pain. Pain similar to previous sickle cell crisis. No urinary symptoms, hx of kidney stones, CP, SHOB, recent falls. No fevers, NV  Does not take pain meds at home d/t previous addiction. Advil  at 1400 today  Review of Systems  Positive: See hpi Negative: Fevers, CP, SHOB, urinary symptoms, falls  Physical Exam  BP 120/78 (BP Location: Left Arm)   Pulse 77   Temp 98.9 F (37.2 C) (Oral)   Resp 18   Ht 5' (1.524 m)   Wt 65 kg   LMP 07/09/2021 (Approximate)   SpO2 99%   BMI 27.99 kg/m  Gen:   Awake, no distress   Resp:  Normal effort  MSK:   Moves extremities without difficulty  Other:    Medical Decision Making  Medically screening exam initiated at 6:15 PM.  Appropriate orders placed.  Lindsey [name] was informed that the remainder of the evaluation will be completed by another provider, this initial triage assessment does not replace that evaluation, and the importance of remaining in the ED until their evaluation is complete.  Labs ordered   Royann Cords, [place] 10/09/23 1818

## 2023-10-10 ENCOUNTER — Telehealth: Payer: Self-pay

## 2023-10-10 NOTE — Telephone Encounter (Signed)
 Error

## 2023-10-24 ENCOUNTER — Other Ambulatory Visit: Payer: Self-pay

## 2023-11-07 ENCOUNTER — Other Ambulatory Visit: Payer: Self-pay | Admitting: Nurse Practitioner

## 2023-11-07 ENCOUNTER — Other Ambulatory Visit: Payer: Self-pay

## 2023-11-07 DIAGNOSIS — F1120 Opioid dependence, uncomplicated: Secondary | ICD-10-CM

## 2023-11-07 DIAGNOSIS — G894 Chronic pain syndrome: Secondary | ICD-10-CM

## 2023-11-07 MED ORDER — BUPRENORPHINE HCL-NALOXONE HCL 2-0.5 MG SL SUBL: 2.0000 | SUBLINGUAL_TABLET | Freq: Every day | SUBLINGUAL | 0 refills | Status: DC

## 2023-11-07 MED ORDER — IBUPROFEN 200 MG PO TABS: 400.0000 mg | ORAL_TABLET | Freq: Four times a day (QID) | ORAL | 1 refills | Status: DC | PRN

## 2023-11-07 NOTE — Telephone Encounter (Signed)
 Copied from CRM [id number]. Topic: Clinical - Prescription Issue >> Nov 07, 2023 12:39 PM [name] wrote: Reason for CRM:Pt call for a refill for ibuprofen  (ADVIL ) 800 MG tablet. Pt states she has not taken in a while. Please call [phone] pt

## 2023-11-07 NOTE — Progress Notes (Signed)
 Reviewed PDMP substance reporting system prior to prescribing opiate medications. No inconsistencies noted.   1. Chronic pain syndrome  - buprenorphine -naloxone  (SUBOXONE ) 2-0.5 mg SUBL SL tablet; Place 2 tablets under the tongue daily.  Dispense: 60 tablet; Refill: 0  2. Uncomplicated opioid dependence (HCC)  - buprenorphine -naloxone  (SUBOXONE ) 2-0.5 mg SUBL SL tablet; Place 2 tablets under the tongue daily.  Dispense: 60 tablet; Refill: 0

## 2023-11-07 NOTE — Telephone Encounter (Unsigned)
 Copied from CRM [id number]. Topic: Clinical - Medication Refill >> Nov 07, 2023 12:44 PM [name] wrote: Medication: buprenorphine -naloxone  (SUBOXONE ) 2-0.5 mg SUBL SL tablet  Has the patient contacted their pharmacy? Yes (Agent: If no, request that the patient contact the pharmacy for the refill. If patient does not wish to contact the pharmacy document the reason why and proceed with request.) (Agent: If yes, when and what did the pharmacy advise?)  This is the patient's preferred pharmacy:  [id number] - [place], [place] - [address] AT [facility] OF [address] & [name] [place] [address] Phone: [phone] Fax: [phone]  Is this the correct pharmacy for this prescription? Yes If no, delete pharmacy and type the correct one.   Has the prescription been filled recently? Yes  Is the patient out of the medication? Yes  Has the patient been seen for an appointment in the last year OR does the patient have an upcoming appointment? Yes  Can we respond through MyChart? Yes  Agent: Please be advised that Rx refills may take up to 3 business days. We ask that you follow-up with your pharmacy.

## 2023-12-20 ENCOUNTER — Ambulatory Visit (INDEPENDENT_AMBULATORY_CARE_PROVIDER_SITE_OTHER): Admitting: Physician Assistant

## 2023-12-20 ENCOUNTER — Encounter (INDEPENDENT_AMBULATORY_CARE_PROVIDER_SITE_OTHER): Payer: Self-pay | Admitting: Physician Assistant

## 2023-12-20 VITALS — BP 127/85 | HR 85

## 2023-12-20 DIAGNOSIS — H9319 Tinnitus, unspecified ear: Secondary | ICD-10-CM

## 2023-12-20 DIAGNOSIS — H9312 Tinnitus, left ear: Secondary | ICD-10-CM

## 2023-12-20 NOTE — Progress Notes (Signed)
 Dear Dr. Juanice, Here is my assessment for our mutual patient, Lindsey [name]. Thank you for allowing me the opportunity to care for your patient. Please do not hesitate to contact me should you have any other questions. Sincerely, Chyrl Cohen PA-C  Otolaryngology Clinic Note Referring provider: Dr. Juanice HPI:  Lindsey [name] is a 53 y.o. female kindly referred by Dr. Juanice   The patient is a 53 year old female seen in our office for evaluation of tinnitus.  The patient was last seen in our office on 09/01/2023 for tinnitus.  She had been seen several times prior to that for the same.  Her symptoms had significant improved at the last office visit.  This was left-sided tinnitus.  She notes that since her last visit she has had some minimal return of symptoms.  She notes left-sided tonal tinnitus that sounds like a hissing sound, she notes that when she chews or opens her mouth she feels like the sound stops.  She is able to provide background noise that distracts her.  She notes she is also going through some emotional stress that she had a tooth break in her mouth and is going to have multiple teeth pulled.  Denies any neurologic deficits.  No changes to hearing.  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     Past Surgical History:  Procedure Laterality Date   APPENDECTOMY     CESAREAN SECTION     OTHER SURGICAL HISTORY     c-section    Family History  Problem Relation Age of Onset   Stroke Neg Hx        none that she knows of    Seizures Neg Hx      Social  Connections: Not on file      Current Outpatient Medications:    amoxicillin -clavulanate (AUGMENTIN ) 875-125 MG tablet, Take 1 tablet by mouth 2 (two) times daily., Disp: 6 tablet, Rfl: 0   buprenorphine -naloxone  (SUBOXONE ) 2-0.5 mg SUBL SL tablet, Place 2 tablets under the tongue daily., Disp: 60 tablet, Rfl: 0   carboxymethylcellulose (REFRESH PLUS) 0.5 % SOLN, Place 1 drop into both eyes daily as needed (dry eyes)., Disp: , Rfl:    diphenhydrAMINE  (BENADRYL ) 25 MG tablet, Take 50 mg by mouth every 6 (six) hours as needed for itching or sleep., Disp: , Rfl:    gabapentin  (NEURONTIN ) 400 MG capsule, TAKE 1 CAPSULE(400 MG) BY MOUTH THREE TIMES DAILY, Disp: 90 capsule, Rfl: 5   ibuprofen  (ADVIL ) 200 MG tablet, Take 2 tablets (400 mg total) by mouth every 6 (six) hours as needed for moderate pain (pain score 4-6)., Disp: 30 tablet, Rfl: 1   ketorolac  (TORADOL ) 10 MG tablet, Take 1 tablet (10 mg total) by mouth every 6 (six) hours as needed (pain)., Disp: 20 tablet, Rfl: 0   ofloxacin  (FLOXIN ) 0.3 % OTIC solution, Place 5 drops into the left ear 2 (two) times daily., Disp: 5 mL, Rfl: 0   traZODone  (DESYREL ) 50 MG tablet, Take 1 tablet (50 mg total) by mouth at  bedtime., Disp: 90 tablet, Rfl: 2   TYLENOL  8 HOUR ARTHRITIS PAIN 650 MG CR tablet, Take 650 mg by mouth every 8 (eight) hours as needed for pain., Disp: , Rfl:    Vitamin D , Ergocalciferol , (DRISDOL ) 1.25 MG (50000 UNIT) CAPS capsule, Take 1 capsule (50,000 Units total) by mouth every 7 (seven) days., Disp: 8 capsule, Rfl: 2   Physical Exam:   BP 127/85   Pulse 85   LMP 07/09/2021 (Approximate)   SpO2 96%   Pertinent Findings  CN II-XII intact Bilateral EAC clear and TM intact with well pneumatized middle ear spaces No lesions of oral cavity/oropharynx; dentition within normal limits No obviously palpable neck masses/lymphadenopathy/thyromegaly No respiratory distress or stridor  Seprately Identifiable Procedures:   None  Impression & Plans:  Lindsey [name] is a 53 y.o. female with the following   Tinnitus-  53 year old female seen in our office for follow-up evaluation of tinnitus.  She notes symptoms have come back, they are not severe, she is able to distract herself with other noise.  No alarming features.  She has significant stress and anxiety surrounding her tinnitus, today she is relatively relaxed as she has gone through this previously but I do feel a lot of her anxiety causes worsening symptoms.  At this point the patient may follow-up on a as needed basis.  She verbalized understanding and agreement to today's plan.   - f/u RN   Thank you for allowing me the opportunity to care for your patient. Please do not hesitate to contact me should you have any other questions.  Sincerely, Chyrl Cohen PA-C [facility] Phone: [phone] Fax: [phone]  12/20/2023, 2:46 PM

## 2024-01-19 ENCOUNTER — Encounter: Payer: Self-pay | Admitting: Nurse Practitioner

## 2024-01-19 ENCOUNTER — Ambulatory Visit (INDEPENDENT_AMBULATORY_CARE_PROVIDER_SITE_OTHER): Payer: Self-pay | Admitting: Nurse Practitioner

## 2024-01-19 VITALS — BP 120/77 | HR 65 | Wt 134.0 lb

## 2024-01-19 DIAGNOSIS — F1190 Opioid use, unspecified, uncomplicated: Secondary | ICD-10-CM | POA: Diagnosis not present

## 2024-01-19 DIAGNOSIS — K5900 Constipation, unspecified: Secondary | ICD-10-CM | POA: Diagnosis not present

## 2024-01-19 DIAGNOSIS — Z13220 Encounter for screening for lipoid disorders: Secondary | ICD-10-CM | POA: Insufficient documentation

## 2024-01-19 DIAGNOSIS — E559 Vitamin D deficiency, unspecified: Secondary | ICD-10-CM | POA: Insufficient documentation

## 2024-01-19 DIAGNOSIS — D571 Sickle-cell disease without crisis: Secondary | ICD-10-CM

## 2024-01-19 MED ORDER — IBUPROFEN 800 MG PO TABS: 800.0000 mg | ORAL_TABLET | Freq: Three times a day (TID) | ORAL | 1 refills | Status: DC | PRN

## 2024-01-19 MED ORDER — POLYETHYLENE GLYCOL 3350 17 GM/SCOOP PO POWD
17.0000 g | Freq: Every day | ORAL | 1 refills | Status: AC | PRN
Start: 2024-01-19 — End: ?

## 2024-01-19 MED ORDER — VITAMIN D (ERGOCALCIFEROL) 1.25 MG (50000 UNIT) PO CAPS: 50000.0000 [IU] | ORAL_CAPSULE | ORAL | 2 refills | Status: AC

## 2024-01-19 NOTE — Assessment & Plan Note (Signed)
 Sickle cell disease with pain episodes Previous pain episodes managed with Toradol  and Dilaudid . Discussed potential interaction between Suboxone  and Dilaudid , risk of overdose. No current pain or crisis. - Advise informing healthcare providers of all current medications, especially Suboxone , during hospital visits. - Prescribe ibuprofen  800 mg for pain management at home, ensuring no kidney issues or contraindications.  Continue Suboxone  2-0.5 mg 2 tablets daily Drink at least 64 ounces of water daily to maintain hydration

## 2024-01-19 NOTE — Assessment & Plan Note (Signed)
  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 Prune juice somewhat effective. - Recommend Miralax  17 grams daily as needed, available over the counter. - Advise use of stool softeners if necessary. - Encourage continued use of prune juice. - Advise increased hydration with at least 64 ounces of water daily. - Recommend increased dietary fiber intake, including vegetables.

## 2024-01-19 NOTE — Assessment & Plan Note (Signed)
 Routine drug screen ordered Continue Suboxone  2-0.5 mg 2 tablets daily

## 2024-01-19 NOTE — Assessment & Plan Note (Signed)
 Last vitamin D  Lab Results  Component Value Date   VD25OH 20.5 (L) 06/27/2023   Vitamin D  deficiency with fatigue, possibly related to deficiency. Not currently taking prescribed vitamin D  supplementation. - Order lab tests to assess current vitamin D  levels. - Advise resuming vitamin D  supplementation as prescribed.

## 2024-01-19 NOTE — Progress Notes (Signed)
 Acute Office Visit  Subjective:     Patient ID: Lindsey [name], female    DOB: 1971/02/27, 53 y.o.   MRN: [id number]  Chief Complaint  Patient presents with   Constipation    HPI    Discussed the use of AI scribe software for clinical note transcription with the patient, who gave verbal consent to proceed.  History of Present Illness Lindsey [name] is a 53 year old female with sickle cell disease who presents with constipation.  She has been experiencing constipation for the past two weeks, with difficulty having bowel movements independently. Prune juice has provided some relief, but she continues to struggle. She recalls similar issues in the past and was prescribed a drinkable solution, the name of which she cannot remember. She wonders if her current constipation could be related to her use of Suboxone , which she has reduced to half a pill. No nausea, vomiting, or abdominal pain.  She has a history of sickle cell disease and experienced a pain crisis a few months ago, for which she went to the emergency room and received Toradol , Benadryl , and Dilaudid . She felt unable to sit down or focus after receiving these medications, possibly due to an interaction with trazodone , which she was also taking at the time. She had taken Suboxone  a few hours before receiving Dilaudid  but did not experience any issues with this combination previously.  Her current medications include gabapentin , trazodone  for sleep, and ibuprofen  for pain management. She has difficulty obtaining a prescription for ibuprofen  800 mg, which she prefers for pain crises. She has not been taking her prescribed vitamin D  50,000 units weekly and attributes her fatigue to this lapse.  She has not experienced a pain crisis since June. She denies any current pain.   Assessment & Plan    Review of Systems  Constitutional:  Negative for appetite change, chills, fatigue and fever.  HENT:  Negative for  congestion, postnasal drip, rhinorrhea and sneezing.   Respiratory:  Negative for cough, shortness of breath and wheezing.   Cardiovascular:  Negative for chest pain, palpitations and leg swelling.  Gastrointestinal:  Positive for constipation. Negative for abdominal pain, nausea and vomiting.  Genitourinary:  Negative for difficulty urinating, dysuria, flank pain and frequency.  Musculoskeletal:  Negative for arthralgias, back pain, joint swelling and myalgias.  Skin:  Negative for color change, pallor, rash and wound.  Neurological:  Negative for dizziness, facial asymmetry, weakness, numbness and headaches.  Psychiatric/Behavioral:  Negative for behavioral problems, confusion, self-injury and suicidal ideas.           Objective:    BP 120/77   Pulse 65   Wt 134 lb (60.8 kg)   LMP 07/09/2021 (Approximate)   SpO2 100%   BMI 26.17 kg/m    Physical Exam Vitals and nursing note reviewed.  Constitutional:      General: She is not in acute distress.    Appearance: Normal appearance. She is not ill-appearing, toxic-appearing or diaphoretic.  HENT:     Mouth/Throat:     Mouth: Mucous membranes are moist.     Pharynx: Oropharynx is clear. No oropharyngeal exudate or posterior oropharyngeal erythema.  Eyes:     General: Scleral icterus present.        Right eye: No discharge.        Left eye: No discharge.     Extraocular Movements: Extraocular movements intact.  Cardiovascular:     Rate and Rhythm: Normal rate and regular rhythm.  Pulses: Normal pulses.     Heart sounds: Normal heart sounds. No murmur heard.    No friction rub. No gallop.  Pulmonary:     Effort: Pulmonary effort is normal. No respiratory distress.     Breath sounds: Normal breath sounds. No stridor. No wheezing, rhonchi or rales.  Chest:     Chest wall: No tenderness.  Abdominal:     General: Bowel sounds are normal. There is no distension.     Palpations: Abdomen is soft.     Tenderness: There is no  abdominal tenderness. There is no right CVA tenderness, left CVA tenderness or guarding.  Musculoskeletal:        General: No swelling, tenderness, deformity or signs of injury.     Right lower leg: No edema.     Left lower leg: No edema.  Skin:    General: Skin is warm and dry.     Capillary Refill: Capillary refill takes less than 2 seconds.     Coloration: Skin is not jaundiced or pale.     Findings: No bruising, erythema or lesion.  Neurological:     Mental Status: She is alert and oriented to person, place, and time.     Motor: No weakness.     Gait: Gait normal.  Psychiatric:        Mood and Affect: Mood normal.        Behavior: Behavior normal.        Thought Content: Thought content normal.        Judgment: Judgment normal.     No results found for any visits on 01/19/24.      Assessment & Plan:   Problem List Items Addressed This Visit       Other   Chronic narcotic use - Primary (Chronic)   Routine drug screen ordered Continue Suboxone  2-0.5 mg 2 tablets daily      Relevant Orders   Sickle Cell Panel   ToxAssure Flex 15, Ur   Hb-SS disease without crisis (HCC)   Sickle cell disease with pain episodes Previous pain episodes managed with Toradol  and Dilaudid . Discussed potential interaction between Suboxone  and Dilaudid , risk of overdose. No current pain or crisis. - Advise informing healthcare providers of all current medications, especially Suboxone , during hospital visits. - Prescribe ibuprofen  800 mg for pain management at home, ensuring no kidney issues or contraindications.  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 Prune juice somewhat effective. - Recommend Miralax   17 grams daily as needed, available over the counter. - Advise use of stool softeners if necessary. - Encourage continued use of prune juice. - Advise increased hydration with at least 64 ounces of water daily. - Recommend increased dietary fiber intake, including vegetables.       Relevant Medications   polyethylene glycol powder (GLYCOLAX /MIRALAX ) 17 GM/SCOOP powder   Screening for lipid disorders   Relevant Orders   Lipid panel   Vitamin D  deficiency   Last vitamin D  Lab Results  Component Value Date   VD25OH 20.5 (L) 06/27/2023   Vitamin D  deficiency with fatigue, possibly related to deficiency. Not currently taking prescribed vitamin D  supplementation. -  Order lab tests to assess current vitamin D  levels. - Advise resuming vitamin D  supplementation as prescribed.      Relevant Medications   Vitamin D , Ergocalciferol , (DRISDOL ) 1.25 MG (50000 UNIT) CAPS capsule    Meds ordered this encounter  Medications   polyethylene glycol powder (GLYCOLAX /MIRALAX ) 17 GM/SCOOP powder    Sig: Take 17 g by mouth daily as needed. Dissolve 1 capful (17g) in 4-8 ounces of liquid and take by mouth daily.    Dispense:  3350 g    Refill:  1   ibuprofen  (ADVIL ) 800 MG tablet    Sig: Take 1 tablet (800 mg total) by mouth every 8 (eight) hours as needed.    Dispense:  30 tablet    Refill:  1   Vitamin D , Ergocalciferol , (DRISDOL ) 1.25 MG (50000 UNIT) CAPS capsule    Sig: Take 1 capsule (50,000 Units total) by mouth every 7 (seven) days.    Dispense:  8 capsule    Refill:  2    Return in about 3 months (around 04/19/2024) for CPE, FASTING LABS upper WEEK.  Janele Lague R Kashton Mcartor, FNP

## 2024-01-19 NOTE — Patient Instructions (Addendum)
  1. Chronic narcotic use (Primary)  - Sickle Cell Panel; Future - ToxAssure Flex 15, Ur; Future  2. Hb-SS disease without crisis (HCC)  - Sickle Cell Panel; Future - ToxAssure Flex 15, Ur; Future - ibuprofen  (ADVIL ) 800 MG tablet; Take 1 tablet (800 mg total) by mouth every 8 (eight) hours as needed.  Dispense: 30 tablet; Refill: 1  3. Constipation, unspecified constipation type  - polyethylene glycol powder (GLYCOLAX /MIRALAX ) 17 GM/SCOOP powder; Take 17 g by mouth daily as needed. Dissolve 1 capful (17g) in 4-8 ounces of liquid and take by mouth daily.  Dispense: 3350 g; Refill: 1    It is important that you exercise regularly at least 30 minutes 5 times a week as tolerated  Think about what you will eat, plan ahead. Choose  clean, green, fresh or frozen over canned, processed or packaged foods which are more sugary, salty and fatty. 70 to 75% of food eaten should be vegetables and fruit. Three meals at set times with snacks allowed between meals, but they must be fruit or vegetables. Aim to eat over a 12 hour period , example 7 am to 7 pm, and STOP after  your last meal of the day. Drink water,generally about 64 ounces per day, no other drink is as healthy. Fruit juice is best enjoyed in a healthy way, by EATING the fruit.  Thanks for choosing [facility] we consider it a privelige to serve you.

## 2024-02-02 ENCOUNTER — Other Ambulatory Visit: Payer: Self-pay

## 2024-02-12 ENCOUNTER — Other Ambulatory Visit: Payer: Self-pay

## 2024-02-12 ENCOUNTER — Telehealth: Payer: Self-pay | Admitting: Nurse Practitioner

## 2024-02-12 DIAGNOSIS — G894 Chronic pain syndrome: Secondary | ICD-10-CM

## 2024-02-12 DIAGNOSIS — F1120 Opioid dependence, uncomplicated: Secondary | ICD-10-CM

## 2024-02-12 MED ORDER — BUPRENORPHINE HCL-NALOXONE HCL 2-0.5 MG SL SUBL: 2.0000 | SUBLINGUAL_TABLET | Freq: Every day | SUBLINGUAL | 0 refills | Status: DC

## 2024-02-12 NOTE — Telephone Encounter (Signed)
 Please advise [facility]

## 2024-02-12 NOTE — Telephone Encounter (Signed)
 Patient is requesting buprenorphine -naloxone  (SUBOXONE ) 2-0.5 mg SUBL SL tablet [[id number]]

## 2024-02-22 ENCOUNTER — Other Ambulatory Visit: Payer: Self-pay | Admitting: Nurse Practitioner

## 2024-02-22 DIAGNOSIS — D5700 Hb-SS disease with crisis, unspecified: Secondary | ICD-10-CM

## 2024-02-22 DIAGNOSIS — G894 Chronic pain syndrome: Secondary | ICD-10-CM

## 2024-02-22 NOTE — Telephone Encounter (Signed)
 Please advise [facility]

## 2024-03-19 ENCOUNTER — Encounter (HOSPITAL_COMMUNITY): Payer: Self-pay

## 2024-03-19 ENCOUNTER — Inpatient Hospital Stay (HOSPITAL_COMMUNITY)
Admission: EM | Admit: 2024-03-19 | Discharge: 2024-03-23 | DRG: 812 | Disposition: A | Discharge status: Home / Self Care | Attending: Emergency Medicine | Admitting: Emergency Medicine

## 2024-03-19 ENCOUNTER — Other Ambulatory Visit: Payer: Self-pay

## 2024-03-19 DIAGNOSIS — D638 Anemia in other chronic diseases classified elsewhere: Secondary | ICD-10-CM | POA: Diagnosis present

## 2024-03-19 DIAGNOSIS — Z79891 Long term (current) use of opiate analgesic: Secondary | ICD-10-CM

## 2024-03-19 DIAGNOSIS — Z888 Allergy status to other drugs, medicaments and biological substances status: Secondary | ICD-10-CM

## 2024-03-19 DIAGNOSIS — Z87891 Personal history of nicotine dependence: Secondary | ICD-10-CM

## 2024-03-19 DIAGNOSIS — G894 Chronic pain syndrome: Secondary | ICD-10-CM | POA: Diagnosis present

## 2024-03-19 DIAGNOSIS — D571 Sickle-cell disease without crisis: Secondary | ICD-10-CM | POA: Diagnosis present

## 2024-03-19 DIAGNOSIS — F419 Anxiety disorder, unspecified: Secondary | ICD-10-CM | POA: Diagnosis present

## 2024-03-19 DIAGNOSIS — Z23 Encounter for immunization: Secondary | ICD-10-CM

## 2024-03-19 DIAGNOSIS — D5700 Hb-SS disease with crisis, unspecified: Secondary | ICD-10-CM | POA: Diagnosis not present

## 2024-03-19 DIAGNOSIS — Z79899 Other long term (current) drug therapy: Secondary | ICD-10-CM

## 2024-03-19 DIAGNOSIS — D72829 Elevated white blood cell count, unspecified: Secondary | ICD-10-CM | POA: Diagnosis present

## 2024-03-19 DIAGNOSIS — F32A Depression, unspecified: Secondary | ICD-10-CM | POA: Diagnosis present

## 2024-03-19 DIAGNOSIS — G8929 Other chronic pain: Secondary | ICD-10-CM | POA: Diagnosis present

## 2024-03-19 LAB — CBC WITH DIFFERENTIAL/PLATELET
Abs Immature Granulocytes: 0.52 K/uL — ABNORMAL HIGH (ref 0.00–0.07)
Basophils Absolute: 0.1 K/uL (ref 0.0–0.1)
Basophils Relative: 1 %
Eosinophils Absolute: 0.2 K/uL (ref 0.0–0.5)
Eosinophils Relative: 2 %
HCT: 26.2 % — ABNORMAL LOW (ref 36.0–46.0)
Hemoglobin: 9.9 g/dL — ABNORMAL LOW (ref 12.0–15.0)
Immature Granulocytes: 4 %
Lymphocytes Relative: 27 %
Lymphs Abs: 3.3 K/uL (ref 0.7–4.0)
MCH: 31.1 pg (ref 26.0–34.0)
MCHC: 37.8 g/dL — ABNORMAL HIGH (ref 30.0–36.0)
MCV: 82.4 fL (ref 80.0–100.0)
Monocytes Absolute: 0.7 K/uL (ref 0.1–1.0)
Monocytes Relative: 6 %
Neutro Abs: 7.6 K/uL (ref 1.7–7.7)
Neutrophils Relative %: 60 %
Platelets: 399 K/uL (ref 150–400)
RBC: 3.18 MIL/uL — ABNORMAL LOW (ref 3.87–5.11)
RDW: 14.5 % (ref 11.5–15.5)
WBC: 12.5 K/uL — ABNORMAL HIGH (ref 4.0–10.5)
nRBC: 0.7 % — ABNORMAL HIGH (ref 0.0–0.2)

## 2024-03-19 LAB — COMPREHENSIVE METABOLIC PANEL WITH GFR
ALT: 12 U/L (ref 0–44)
AST: 29 U/L (ref 15–41)
Albumin: 4.7 g/dL (ref 3.5–5.0)
Alkaline Phosphatase: 49 U/L (ref 38–126)
Anion gap: 10 (ref 5–15)
BUN: 16 mg/dL (ref 6–20)
CO2: 26 mmol/L (ref 22–32)
Calcium: 9.6 mg/dL (ref 8.9–10.3)
Chloride: 106 mmol/L (ref 98–111)
Creatinine, Ser: 0.92 mg/dL (ref 0.44–1.00)
GFR, Estimated: >60 mL/min (ref >60)
Glucose, Bld: 93 mg/dL (ref 70–99)
Potassium: 4.1 mmol/L (ref 3.5–5.1)
Sodium: 141 mmol/L (ref 135–145)
Total Bilirubin: 1.1 mg/dL (ref 0.0–1.2)
Total Protein: 7.6 g/dL (ref 6.5–8.1)

## 2024-03-19 LAB — RETICULOCYTES
Immature Retic Fract: 25.5 % — ABNORMAL HIGH (ref 2.3–15.9)
RBC.: 3.22 MIL/uL — ABNORMAL LOW (ref 3.87–5.11)
Retic Count, Absolute: 136.5 K/uL (ref 19.0–186.0)
Retic Ct Pct: 4.2 % — ABNORMAL HIGH (ref 0.4–3.1)

## 2024-03-19 LAB — HCG, SERUM, QUALITATIVE: Preg, Serum: NEGATIVE

## 2024-03-19 MED ORDER — HYDROMORPHONE HCL 1 MG/ML IJ SOLN
2.0000 mg | Freq: Once | INTRAMUSCULAR | Status: AC
  Administered 2024-03-19: 2 mg via INTRAVENOUS
  Filled 2024-03-19: qty 2

## 2024-03-19 MED ORDER — ONDANSETRON HCL 4 MG/2ML IJ SOLN: 4.0000 mg | Freq: Four times a day (QID) | INTRAMUSCULAR | Status: DC | PRN

## 2024-03-19 MED ORDER — HYDROMORPHONE HCL 1 MG/ML IJ SOLN
2.0000 mg | INTRAMUSCULAR | Status: AC
  Filled 2024-03-19: qty 2

## 2024-03-19 MED ORDER — GABAPENTIN 400 MG PO CAPS
400.0000 mg | ORAL_CAPSULE | Freq: Three times a day (TID) | ORAL | Status: DC
  Administered 2024-03-20 – 2024-03-23 (×11): 400 mg via ORAL
  Filled 2024-03-19 (×4): qty 4
  Filled 2024-03-19: qty 1
  Filled 2024-03-19: qty 4
  Filled 2024-03-19 (×2): qty 1
  Filled 2024-03-19 (×3): qty 4

## 2024-03-19 MED ORDER — HYDROMORPHONE HCL 1 MG/ML IJ SOLN
2.0000 mg | INTRAMUSCULAR | Status: AC
  Administered 2024-03-19: 2 mg via INTRAVENOUS
  Filled 2024-03-19: qty 2

## 2024-03-19 MED ORDER — HYDROMORPHONE HCL 1 MG/ML IJ SOLN
2.0000 mg | INTRAMUSCULAR | Status: AC
  Administered 2024-03-19: 2 mg via INTRAVENOUS

## 2024-03-19 MED ORDER — ENOXAPARIN SODIUM 40 MG/0.4ML IJ SOSY
40.0000 mg | PREFILLED_SYRINGE | INTRAMUSCULAR | Status: DC
  Administered 2024-03-20 – 2024-03-23 (×4): 40 mg via SUBCUTANEOUS
  Filled 2024-03-19 (×4): qty 0.4

## 2024-03-19 MED ORDER — SODIUM CHLORIDE 0.9% FLUSH: 9.0000 mL | INTRAVENOUS | Status: DC | PRN

## 2024-03-19 MED ORDER — SODIUM CHLORIDE 0.45 % IV SOLN: INTRAVENOUS | Status: AC

## 2024-03-19 MED ORDER — NALOXONE HCL 0.4 MG/ML IJ SOLN
0.4000 mg | INTRAMUSCULAR | Status: DC | PRN
Start: 2024-03-19 — End: 2024-03-20

## 2024-03-19 MED ORDER — BUPRENORPHINE HCL-NALOXONE HCL 2-0.5 MG SL SUBL
2.0000 | SUBLINGUAL_TABLET | Freq: Every day | SUBLINGUAL | Status: DC
  Administered 2024-03-20: 2 via SUBLINGUAL
  Administered 2024-03-21 – 2024-03-22 (×2): 1 via SUBLINGUAL
  Administered 2024-03-23: 2 via SUBLINGUAL
  Filled 2024-03-19 (×4): qty 2

## 2024-03-19 MED ORDER — HYDROMORPHONE 1 MG/ML IV SOLN
INTRAVENOUS | Status: DC
  Administered 2024-03-20: 1 mg via INTRAVENOUS
  Administered 2024-03-20: 30 mg via INTRAVENOUS
  Filled 2024-03-19: qty 30

## 2024-03-19 MED ORDER — KETOROLAC TROMETHAMINE 15 MG/ML IJ SOLN
15.0000 mg | Freq: Four times a day (QID) | INTRAMUSCULAR | Status: AC
  Administered 2024-03-20 – 2024-03-21 (×5): 15 mg via INTRAVENOUS
  Filled 2024-03-19 (×5): qty 1

## 2024-03-19 MED ORDER — SENNOSIDES-DOCUSATE SODIUM 8.6-50 MG PO TABS
1.0000 | ORAL_TABLET | Freq: Two times a day (BID) | ORAL | Status: DC
  Administered 2024-03-20 – 2024-03-23 (×7): 1 via ORAL
  Filled 2024-03-19 (×8): qty 1

## 2024-03-19 MED ORDER — POLYETHYLENE GLYCOL 3350 17 G PO PACK: 17.0000 g | PACK | Freq: Every day | ORAL | Status: DC | PRN

## 2024-03-19 MED ORDER — KETOROLAC TROMETHAMINE 15 MG/ML IJ SOLN
15.0000 mg | INTRAMUSCULAR | Status: AC
  Filled 2024-03-19 (×2): qty 1

## 2024-03-19 NOTE — H&P (Signed)
 History and Physical    Lindsey [name] FMW:[id number] DOB: [birth year] DOA: 03/19/2024  Patient coming from: Home.  Chief Complaint: Back pain.  HPI: Lindsey [name] is a 53 y.o. female with history of sickle cell anemia presents to the ER with complaints of back pain radiating to her lower extremity which is typical of her sickle cell pain crisis as per the patient.  Patient has been taking Suboxone  and gabapentin  at home.  Denies any fever chills headache visual symptoms chest pain or shortness of breath.  ED Course: In the ER patient's labs show WBC of 12.5 hemoglobin 9.9.  Patient was given multiple doses of pain relief medications despite which patient was still in pain admitted for sickle cell pain crisis.  Review of Systems: As per HPI, rest all negative.   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 Her smoking use included cigars and cigarettes. She has never used smokeless tobacco. She reports current alcohol use. She reports that she does not currently use drugs after having used the following drugs: Marijuana.  Allergies  Allergen Reactions   Ketamine  Anxiety and Other (See Comments)    Tachycardia    Family History  Problem Relation Age of Onset   Stroke Neg Hx        none that she knows of    Seizures Neg Hx     Prior to Admission medications   Medication Sig Start Date End Date Taking? Authorizing Provider  buprenorphine -naloxone  (SUBOXONE ) 2-0.5 mg SUBL SL tablet  Place 2 tablets under the tongue daily. 02/12/24  Yes Paseda, Folashade R, FNP  carboxymethylcellulose (REFRESH PLUS) 0.5 % SOLN Place 1 drop into both eyes daily as needed (dry eyes).    [provider]  diphenhydrAMINE  (BENADRYL ) 25 MG tablet Take 50 mg by mouth every 6 (six) hours as needed for itching or sleep. Patient not taking: Reported on 01/19/2024    [provider]  gabapentin  (NEURONTIN ) 400 MG capsule TAKE 1 CAPSULE(400 MG) BY MOUTH THREE TIMES DAILY 02/23/24   Paseda, Folashade R, FNP  ibuprofen  (ADVIL ) 800 MG tablet Take 1 tablet (800 mg total) by mouth every 8 (eight) hours as needed. 01/19/24   Paseda, Folashade R, FNP  polyethylene glycol powder (GLYCOLAX /MIRALAX ) 17 GM/SCOOP powder Take 17 g by mouth daily as needed. Dissolve 1 capful (17g) in 4-8 ounces of liquid and take by mouth daily. 01/19/24   Paseda, Folashade R, FNP  traZODone  (DESYREL ) 50 MG tablet Take 1 tablet (50 mg total) by mouth at bedtime. 08/31/23   Oley Bascom RAMAN, NP  TYLENOL  8 HOUR ARTHRITIS PAIN 650 MG CR tablet Take 650 mg by mouth every 8 (eight) hours as needed for pain. Patient not taking: Reported on 01/19/2024    [provider]  Vitamin D , Ergocalciferol , (DRISDOL ) 1.25 MG (50000 UNIT) CAPS capsule Take 1 capsule (50,000 Units total) by mouth  every 7 (seven) days. 01/19/24   Paseda, Folashade R, FNP    Physical Exam: Constitutional: Moderately built and nourished. Vitals:   03/19/24 1636 03/19/24 2003 03/19/24 2030 03/19/24 2033  BP: (!) 163/105 (!) 123/92 (!) 109/90   Pulse: (!) 104 67 74   Resp: 16 18    Temp: 97.7 F (36.5 C) 97.8 F (36.6 C)    TempSrc: Oral Oral    SpO2: 100% 97% 100%   Weight:    60.8 kg  Height:    5' (1.524 m)   Eyes: Anicteric no pallor. ENMT: No discharge from the ears eyes nose or mouth. Neck: No mass felt.  No neck rigidity. Respiratory: No rhonchi or crepitations. Cardiovascular: S1-S2 heard. Abdomen: Soft nontender bowel sound  present. Musculoskeletal: No edema. Skin: No rash. Neurologic: Alert awake oriented to time place and person.  Moves all extremities. Psychiatric: Appears normal.  Normal affect.   Labs on Admission: I have personally reviewed following labs and imaging studies  CBC: Recent Labs  Lab 03/19/24 1834  WBC 12.5*  NEUTROABS 7.6  HGB 9.9*  HCT 26.2*  MCV 82.4  PLT 399   Basic Metabolic Panel: Recent Labs  Lab 03/19/24 1834  NA 141  K 4.1  CL 106  CO2 26  GLUCOSE 93  BUN 16  CREATININE 0.92  CALCIUM 9.6   GFR: Estimated Creatinine Clearance: 58.3 mL/min (by C-G formula based on SCr of 0.92 mg/dL). Liver Function Tests: Recent Labs  Lab 03/19/24 1834  AST 29  ALT 12  ALKPHOS 49  BILITOT 1.1  PROT 7.6  ALBUMIN 4.7   No results for input(s): LIPASE, AMYLASE in the last 168 hours. No results for input(s): AMMONIA in the last 168 hours. Coagulation Profile: No results for input(s): INR, PROTIME in the last 168 hours. Cardiac Enzymes: No results for input(s): CKTOTAL, CKMB, CKMBINDEX, TROPONINI in the last 168 hours. BNP (last 3 results) No results for input(s): PROBNP in the last 8760 hours. HbA1C: No results for input(s): HGBA1C in the last 72 hours. CBG: No results for input(s): GLUCAP in the last 168 hours. Lipid Profile: No results for input(s): CHOL, HDL, LDLCALC, TRIG, CHOLHDL, LDLDIRECT in the last 72 hours. Thyroid Function Tests: No results for input(s): TSH, T4TOTAL, FREET4, T3FREE, THYROIDAB in the last 72 hours. Anemia Panel: Recent Labs    03/19/24 1834  RETICCTPCT 4.2*   Urine analysis:    Component Value Date/Time   COLORURINE YELLOW 05/31/2021 1350   APPEARANCEUR TURBID (A) 05/31/2021 1350   LABSPEC 1.012 05/31/2021 1350   PHURINE 5.0 05/31/2021 1350   GLUCOSEU NEGATIVE 05/31/2021 1350   HGBUR SMALL (A) 05/31/2021 1350   BILIRUBINUR NEGATIVE 05/31/2021 1350   BILIRUBINUR neg 04/07/2020 1014    KETONESUR NEGATIVE 05/31/2021 1350   PROTEINUR 100 (A) 05/31/2021 1350   UROBILINOGEN 0.2 04/07/2020 1014   UROBILINOGEN 1.0 12/25/2012 1755   NITRITE NEGATIVE 05/31/2021 1350   LEUKOCYTESUR MODERATE (A) 05/31/2021 1350   Sepsis Labs: @LABRCNTIP (procalcitonin:4,lacticidven:4) )No results found for this or any previous visit (from the past 240 hours).   Radiological Exams on Admission: No results found.   Assessment/Plan Principal Problem:   Sickle cell pain crisis (HCC) Active Problems:   Sickle cell anemia (HCC)    Sickle cell pain crisis -    will keep patient on Dilaudid  PCA.  Continue Suboxone  and gabapentin .  Chest x-ray pending.  Not hypoxic.  Medications confirmed on PDMP website. Sickle cell anemia follow CBC.  Hemoglobin at around  baseline. Leukocytosis likely reactionary.  Follow CBC.  Patient afebrile.   DVT prophylaxis: Lovenox . Code Status: Full code. Family Communication: Patient's family at the bedside. Disposition Plan: Medical floor. Consults called: None. Admission status: The patient.

## 2024-03-19 NOTE — ED Provider Notes (Signed)
 [place] EMERGENCY DEPARTMENT AT [facility] Provider Note   CSN: [id number] Arrival date & time: 03/19/24  8367     Patient presents with: Sickle Cell Pain Crisis   Jarrell Fruma [place] is a 53 y.o. female hx of sickle cell here presenting with back pain.  Patient states that she had sudden onset of back pain that started around 4 PM.  Patient was playing with her nephew but denies any trauma or injury.  She states that she is on Suboxone  at baseline and not on any pain medicine.  Patient denies any chest pain or shortness of breath.    The history is provided by the patient.       Prior to Admission medications   Medication Sig Start Date End Date Taking? Authorizing Provider  buprenorphine -naloxone  (SUBOXONE ) 2-0.5 mg SUBL SL tablet Place 2 tablets under the tongue daily. 02/12/24  Yes Paseda, Folashade R, FNP  carboxymethylcellulose (REFRESH PLUS) 0.5 % SOLN Place 1 drop into both eyes daily as needed (dry eyes).    [provider]  diphenhydrAMINE  (BENADRYL ) 25 MG tablet Take 50 mg by mouth every 6 (six) hours as needed for itching or sleep. Patient not taking: Reported on 01/19/2024    [provider]  gabapentin  (NEURONTIN ) 400 MG capsule TAKE 1 CAPSULE(400 MG) BY MOUTH THREE TIMES DAILY 02/23/24   Paseda, Folashade R, FNP  ibuprofen  (ADVIL ) 800 MG tablet Take 1 tablet (800 mg total) by mouth every 8 (eight) hours as needed. 01/19/24   Paseda, Folashade R, FNP  polyethylene glycol powder (GLYCOLAX /MIRALAX ) 17 GM/SCOOP powder Take 17 g by mouth daily as needed. Dissolve 1 capful (17g) in 4-8 ounces of liquid and take by mouth daily. 01/19/24   Paseda, Folashade R, FNP  traZODone  (DESYREL ) 50 MG tablet Take 1 tablet (50 mg total) by mouth at bedtime. 08/31/23   Oley Bascom RAMAN, NP  TYLENOL  8 HOUR ARTHRITIS PAIN 650 MG CR tablet Take 650 mg by mouth every 8 (eight) hours as needed for pain. Patient not taking: Reported on 01/19/2024    [provider]  Vitamin D , Ergocalciferol , (DRISDOL ) 1.25 MG (50000 UNIT) CAPS capsule Take 1 capsule (50,000 Units total) by mouth every 7 (seven) days. 01/19/24   Paseda, Folashade R, FNP    Allergies: Ketamine     Review of Systems  Musculoskeletal:  Positive for back pain.  All other systems reviewed and are negative.   Updated Vital Signs BP (!) 163/105 (BP Location: Left Arm)   Pulse (!) 104   Temp 97.7 F (36.5 C) (Oral)   Resp 16   LMP 07/09/2021 (Approximate)   SpO2 100%   Physical Exam Vitals and nursing note reviewed.  Constitutional:      Comments: Uncomfortable and crying in pain  HENT:     Head: Normocephalic.     Nose: Nose normal.     Mouth/Throat:     Mouth: Mucous membranes are moist.  Eyes:     Extraocular Movements: Extraocular movements intact.     Pupils: Pupils are equal, round, and reactive to light.  Cardiovascular:     Rate and Rhythm: Normal rate and regular rhythm.     Pulses: Normal pulses.  Pulmonary:     Effort: Pulmonary effort is normal.     Breath sounds: Normal breath sounds.  Abdominal:     General: Abdomen is flat.     Palpations: Abdomen is soft.  Musculoskeletal:        General: Normal  range of motion.     Cervical back: Normal range of motion and neck supple.  Skin:    General: Skin is warm.     Capillary Refill: Capillary refill takes less than 2 seconds.  Neurological:     General: No focal deficit present.     Mental Status: She is alert and oriented to person, place, and time.  Psychiatric:        Mood and Affect: Mood normal.        Behavior: Behavior normal.     (all labs ordered are listed, but only abnormal results are displayed) Labs Reviewed  CBC WITH DIFFERENTIAL/PLATELET  RETICULOCYTES  COMPREHENSIVE METABOLIC PANEL WITH GFR  HCG, SERUM, QUALITATIVE    EKG: None  Radiology: No results found.   Procedures   Angiocath insertion Performed by: Alm VEAR Cave  Consent: Verbal consent obtained. Risks and  benefits: risks, benefits and alternatives were discussed Time out: Immediately prior to procedure a time out was called to verify the correct patient, procedure, equipment, support staff and site/side marked as required.  Preparation: Patient was prepped and draped in the usual sterile fashion.  Vein Location: L antecube   Ultrasound Guided  Gauge: 20 long   Normal blood return and flush without difficulty Patient tolerance: Patient tolerated the procedure well with no immediate complications.    Medications Ordered in the ED  0.45 % sodium chloride  infusion (has no administration in time range)  ketorolac  (TORADOL ) 15 MG/ML injection 15 mg (has no administration in time range)  HYDROmorphone  (DILAUDID ) injection 2 mg (has no administration in time range)  HYDROmorphone  (DILAUDID ) injection 2 mg (has no administration in time range)  HYDROmorphone  (DILAUDID ) injection 2 mg (2 mg Intravenous Given 03/19/24 1843)                                    Medical Decision Making Markiyah Seva Chancy is a 53 y.o. female here presenting with possible sickle cell pain crisis.  Patient has back pain.  No chest pain or shortness of breath to suggest acute chest.  Plan to get CBC and BMP and reticulocyte count and give Toradol  and pain medicine and reassess  10:03 PM Patient is still in severe pain despite pain medicine.  At this point hospitalist to admit for sickle cell pain crisis  Problems Addressed: Sickle cell disease with crisis [facility]): acute illness or injury  Amount and/or Complexity of Data Reviewed Labs: ordered. Decision-making details documented in ED Course.  Risk Prescription drug management.     Final diagnoses:  None    ED Discharge Orders     None          Cave Alm Macho, MD 03/19/24 2204

## 2024-03-19 NOTE — ED Notes (Signed)
 First poc with patient. PT crying in pain in bed with daughter at bedside.  PT medicated per order.  No respiratory distress noted.  Axox4. GCS 15.  Will reassess pain. Provided warm blankets and assisted with repositioning per pt request

## 2024-03-19 NOTE — ED Triage Notes (Signed)
 Pt arrives via wheelchair with complaints of sickle cell crisis, with pain in her back. PT states that her pain began suddenly approx 1 hour ago while playing with her nephew outside.   Pt reports that she took tylenol  without improvement. Pt tearful during triage.

## 2024-03-20 ENCOUNTER — Observation Stay (HOSPITAL_COMMUNITY)

## 2024-03-20 DIAGNOSIS — D72829 Elevated white blood cell count, unspecified: Secondary | ICD-10-CM | POA: Diagnosis present

## 2024-03-20 DIAGNOSIS — F32A Depression, unspecified: Secondary | ICD-10-CM | POA: Diagnosis present

## 2024-03-20 DIAGNOSIS — D5700 Hb-SS disease with crisis, unspecified: Secondary | ICD-10-CM | POA: Diagnosis present

## 2024-03-20 DIAGNOSIS — D638 Anemia in other chronic diseases classified elsewhere: Secondary | ICD-10-CM | POA: Diagnosis present

## 2024-03-20 DIAGNOSIS — Z79891 Long term (current) use of opiate analgesic: Secondary | ICD-10-CM | POA: Diagnosis not present

## 2024-03-20 DIAGNOSIS — G894 Chronic pain syndrome: Secondary | ICD-10-CM | POA: Diagnosis present

## 2024-03-20 DIAGNOSIS — Z79899 Other long term (current) drug therapy: Secondary | ICD-10-CM | POA: Diagnosis not present

## 2024-03-20 DIAGNOSIS — F419 Anxiety disorder, unspecified: Secondary | ICD-10-CM | POA: Diagnosis present

## 2024-03-20 DIAGNOSIS — Z87891 Personal history of nicotine dependence: Secondary | ICD-10-CM | POA: Diagnosis not present

## 2024-03-20 DIAGNOSIS — Z888 Allergy status to other drugs, medicaments and biological substances status: Secondary | ICD-10-CM | POA: Diagnosis not present

## 2024-03-20 DIAGNOSIS — Z23 Encounter for immunization: Secondary | ICD-10-CM | POA: Diagnosis not present

## 2024-03-20 LAB — CBC WITH DIFFERENTIAL/PLATELET
Abs Immature Granulocytes: 0.38 K/uL — ABNORMAL HIGH (ref 0.00–0.07)
Basophils Absolute: 0.1 K/uL (ref 0.0–0.1)
Basophils Relative: 1 %
Eosinophils Absolute: 0.2 K/uL (ref 0.0–0.5)
Eosinophils Relative: 2 %
HCT: 26.7 % — ABNORMAL LOW (ref 36.0–46.0)
Hemoglobin: 9.8 g/dL — ABNORMAL LOW (ref 12.0–15.0)
Immature Granulocytes: 3 %
Lymphocytes Relative: 35 %
Lymphs Abs: 5.2 K/uL — ABNORMAL HIGH (ref 0.7–4.0)
MCH: 30.5 pg (ref 26.0–34.0)
MCHC: 36.7 g/dL — ABNORMAL HIGH (ref 30.0–36.0)
MCV: 83.2 fL (ref 80.0–100.0)
Monocytes Absolute: 1 K/uL (ref 0.1–1.0)
Monocytes Relative: 7 %
Neutro Abs: 8 K/uL — ABNORMAL HIGH (ref 1.7–7.7)
Neutrophils Relative %: 52 %
Platelets: 394 K/uL (ref 150–400)
RBC: 3.21 MIL/uL — ABNORMAL LOW (ref 3.87–5.11)
RDW: 14.6 % (ref 11.5–15.5)
WBC: 14.9 K/uL — ABNORMAL HIGH (ref 4.0–10.5)
nRBC: 1.1 % — ABNORMAL HIGH (ref 0.0–0.2)

## 2024-03-20 LAB — BASIC METABOLIC PANEL WITH GFR
Anion gap: 11 (ref 5–15)
BUN: 15 mg/dL (ref 6–20)
CO2: 25 mmol/L (ref 22–32)
Calcium: 9.7 mg/dL (ref 8.9–10.3)
Chloride: 106 mmol/L (ref 98–111)
Creatinine, Ser: 0.86 mg/dL (ref 0.44–1.00)
GFR, Estimated: >60 mL/min (ref >60)
Glucose, Bld: 88 mg/dL (ref 70–99)
Potassium: 4.1 mmol/L (ref 3.5–5.1)
Sodium: 141 mmol/L (ref 135–145)

## 2024-03-20 MED ORDER — HYDROMORPHONE HCL 1 MG/ML IJ SOLN
1.0000 mg | Freq: Once | INTRAMUSCULAR | Status: AC
  Administered 2024-03-20: 1 mg via INTRAVENOUS
  Filled 2024-03-20: qty 1

## 2024-03-20 MED ORDER — INFLUENZA VIRUS VACC SPLIT PF (FLUZONE) 0.5 ML IM SUSY: 0.5000 mL | PREFILLED_SYRINGE | INTRAMUSCULAR | Status: DC

## 2024-03-20 MED ORDER — SODIUM CHLORIDE 0.45 % IV SOLN: INTRAVENOUS | Status: AC

## 2024-03-20 MED ORDER — HYDROMORPHONE 1 MG/ML IV SOLN
INTRAVENOUS | Status: DC
  Administered 2024-03-20: 2 mg via INTRAVENOUS
  Administered 2024-03-21: 4 mg via INTRAVENOUS
  Administered 2024-03-21: 1 mg via INTRAVENOUS
  Administered 2024-03-21: 2 mg via INTRAVENOUS
  Administered 2024-03-22: 5 mg via INTRAVENOUS
  Administered 2024-03-22: 30 mg via INTRAVENOUS
  Administered 2024-03-22: 4 mg via INTRAVENOUS
  Administered 2024-03-22: 0.5 mg via INTRAVENOUS
  Administered 2024-03-23: 4 mg via INTRAVENOUS
  Administered 2024-03-23: 2.5 mg via INTRAVENOUS
  Filled 2024-03-20: qty 30

## 2024-03-20 MED ORDER — PNEUMOCOCCAL 20-VAL CONJ VACC 0.5 ML IM SUSY
0.5000 mL | PREFILLED_SYRINGE | INTRAMUSCULAR | Status: DC
  Filled 2024-03-20: qty 0.5

## 2024-03-20 MED ORDER — ONDANSETRON HCL 4 MG/2ML IJ SOLN: 4.0000 mg | Freq: Four times a day (QID) | INTRAMUSCULAR | Status: DC | PRN

## 2024-03-20 MED ORDER — NALOXONE HCL 0.4 MG/ML IJ SOLN: 0.4000 mg | INTRAMUSCULAR | Status: DC | PRN

## 2024-03-20 MED ORDER — SODIUM CHLORIDE 0.9% FLUSH: 9.0000 mL | INTRAVENOUS | Status: DC | PRN

## 2024-03-20 MED ORDER — HYDROMORPHONE HCL 1 MG/ML IJ SOLN
2.0000 mg | Freq: Once | INTRAMUSCULAR | Status: AC
  Administered 2024-03-20: 2 mg via INTRAVENOUS
  Filled 2024-03-20: qty 2

## 2024-03-20 MED ORDER — DIPHENHYDRAMINE HCL 25 MG PO CAPS: 25.0000 mg | ORAL_CAPSULE | ORAL | Status: DC | PRN

## 2024-03-20 NOTE — Plan of Care (Signed)
 Patient still having a lot of pain.  Contacted MD for two PRN doses of hydromorphone .  Vitals remain stable.

## 2024-03-20 NOTE — Progress Notes (Addendum)
 Patient ID: Lindsey [name], female   DOB: 1970-10-14, 53 y.o.   MRN: [id number] Subjective: Lindsey [name] is a 53 year old  female with a medical history significant for sickle cell disease,chronic pain syndrome,opiate dependence and tolerance, and history of anemia of chronic disease presents with complaints of bilateral lower extremity pain over the past 24 hours. Pain unrelieved by patient's home medications. She rates pain as 10/10 this morning constant, and throbbing. Denies fever, chills, chest pain, headache. No nausea, vomiting, or diarrhea. No urinary symptoms, No recent travel or sick contacts.   Objective:  Vital signs in last 24 hours:  Vitals:   03/20/24 0346 03/20/24 0347 03/20/24 0750 03/20/24 0755  BP:  119/77 114/76   Pulse:  86 76   Resp: 13 18 16 10   Temp:  98.1 F (36.7 C) 98.4 F (36.9 C)   TempSrc:  Oral    SpO2: 99% 98% 99% 99%  Weight:      Height:        Intake/Output from previous day:   Intake/Output Summary (Last 24 hours) at 03/20/2024 1136 Last data filed at 03/20/2024 0800 Gross per 24 hour  Intake 680.09 ml  Output --  Net 680.09 ml    Physical Exam: General: Alert, awake, oriented x3, in no acute distress.  HEENT: [place]/AT PEERL, EOMI Neck: Trachea midline,  no masses, no thyromegal,y no JVD, no carotid bruit OROPHARYNX:  Moist, No exudate/ erythema/lesions.  Heart: Regular rate and rhythm, without murmurs, rubs, gallops, PMI non-displaced, no heaves or thrills on palpation.  Lungs: Clear to auscultation, no wheezing or rhonchi noted. No increased vocal fremitus resonant to percussion  Abdomen: Soft, nontender, nondistended, positive bowel sounds, no masses no hepatosplenomegaly noted..  Neuro: No focal neurological deficits noted cranial nerves II through XII grossly intact. DTRs 2+ bilaterally upper and lower extremities. Strength 5 out of 5 in bilateral upper and lower extremities. Musculoskeletal: Generalize body  tenderness Psychiatric: Patient alert and oriented x3, good insight and cognition, good recent to remote recall. Lymph node survey: No cervical axillary or inguinal lymphadenopathy noted.  Lab Results:  Basic Metabolic Panel:    Component Value Date/Time   NA 141 03/20/2024 0031   NA 145 (H) 06/27/2023 1206   K 4.1 03/20/2024 0031   CL 106 03/20/2024 0031   CO2 25 03/20/2024 0031   BUN 15 03/20/2024 0031   BUN 14 06/27/2023 1206   CREATININE 0.86 03/20/2024 0031   GLUCOSE 88 03/20/2024 0031   CALCIUM 9.7 03/20/2024 0031   CBC:    Component Value Date/Time   WBC 14.9 (H) 03/20/2024 0031   HGB 9.8 (L) 03/20/2024 0031   HGB 10.7 (L) 06/27/2023 1206   HCT 26.7 (L) 03/20/2024 0031   HCT 31.7 (L) 06/27/2023 1206   PLT 394 03/20/2024 0031   PLT 455 (H) 06/27/2023 1206   MCV 83.2 03/20/2024 0031   MCV 93 06/27/2023 1206   NEUTROABS 8.0 (H) 03/20/2024 0031   NEUTROABS 6.0 06/27/2023 1206   LYMPHSABS 5.2 (H) 03/20/2024 0031   LYMPHSABS 3.4 (H) 06/27/2023 1206   MONOABS 1.0 03/20/2024 0031   EOSABS 0.2 03/20/2024 0031   EOSABS 0.4 06/27/2023 1206   BASOSABS 0.1 03/20/2024 0031   BASOSABS 0.1 06/27/2023 1206    No results found for this or any previous visit (from the past 240 hours).  Studies/Results: DG CHEST PORT 1 VIEW Result Date: 03/20/2024 CLINICAL DATA:  Chest pain and shortness of breath. EXAM: PORTABLE CHEST 1 VIEW COMPARISON:  08/27/2022 FINDINGS: The cardio pericardial silhouette is enlarged. Subtle patchy basilar opacity bilaterally, left greater than right, is likely atelectatic although infection is not excluded. No pulmonary edema or substantial pleural effusion. No acute bony abnormality. IMPRESSION: Subtle patchy basilar opacity bilaterally, left greater than right, is likely atelectatic although infection is not excluded. Electronically Signed   By: Camellia Candle M.D.   On: 03/20/2024 05:03    Medications: Scheduled Meds:  buprenorphine -naloxone   2 tablet  Sublingual Daily   enoxaparin  (LOVENOX ) injection  40 mg Subcutaneous Q24H   gabapentin   400 mg Oral TID   HYDROmorphone    Intravenous Q4H   [START ON 03/21/2024] influenza vac split trivalent PF  0.5 mL Intramuscular Tomorrow-1000   ketorolac   15 mg Intravenous STAT   ketorolac   15 mg Intravenous Q6H   [START ON 03/21/2024] pneumococcal 20-valent conjugate vaccine  0.5 mL Intramuscular Tomorrow-1000   senna-docusate  1 tablet Oral BID   Continuous Infusions:  sodium chloride  75 mL/hr at 03/19/24 2343   PRN Meds:.diphenhydrAMINE , naloxone  **AND** sodium chloride  flush, ondansetron  (ZOFRAN ) IV, polyethylene glycol  Consultants: None  Procedures: None  Antibiotics: None  Assessment/Plan: Principal Problem:   Sickle cell pain crisis (HCC) Active Problems:   Leukocytosis   Chronic pain   Sickle cell anemia (HCC)   Anxiety and depression   Hb Sickle Cell Disease with Pain crisis: Pain is not improving, continue IVF 0.45% Saline @ k[facility], changed PCA dose to 0.5/10/3, give 2 mg bolus once, IV Toradol  15 mg Q 6 H for a total of 5 days, continue oral home pain medications as ordered. Monitor vitals very closely, Re-evaluate pain scale regularly, 2 L of Oxygen  by [place]. Patient encouraged to ambulate on the hallway today.  Leukocytosis: Slightly elevated, no acute signs or symptoms of infection, will continue to monitor without antibiotics.  Anemia of Chronic Disease: Hgb within patients baseline , no clinical indication for blood transfusion.  Chronic pain Syndrome: Continue oral home pain medication  Anxiety and depression: denies suicidal ideations. Continue medications as prescribed and follow up as scheduled.   Code Status: Full Code Family Communication: N/A Disposition Plan: Not yet ready for discharge  Homer CHRISTELLA Cover NP   If 7PM-7AM, please contact night-coverage.  03/20/2024, 11:36 AM  LOS: 0 days

## 2024-03-20 NOTE — Plan of Care (Signed)

## 2024-03-21 MED ORDER — KETOROLAC TROMETHAMINE 15 MG/ML IJ SOLN
15.0000 mg | Freq: Four times a day (QID) | INTRAMUSCULAR | Status: DC
  Administered 2024-03-21 – 2024-03-23 (×8): 15 mg via INTRAVENOUS
  Filled 2024-03-21 (×9): qty 1

## 2024-03-21 MED ORDER — TRAZODONE HCL 50 MG PO TABS
50.0000 mg | ORAL_TABLET | Freq: Every day | ORAL | Status: DC
  Administered 2024-03-22: 50 mg via ORAL
  Filled 2024-03-21 (×2): qty 1

## 2024-03-21 NOTE — Progress Notes (Signed)
 Patient ID: [name], female   DOB: [birth year], 53 y.o.   MRN: [id number] Subjective: Casilda Pickerill is a 53 year old  female with a medical history significant for sickle cell disease,chronic pain syndrome,opiate dependence and tolerance, and history of anemia of chronic disease presents with complaints of bilateral lower extremity pain over the past 24 hours. Pain unrelieved by patient's home medications.   She rates pain as 8/10 this morning constant, and throbbing gradually improving from yesterday. Denies fever, chills, chest pain, headache. No nausea, vomiting, or diarrhea. No urinary symptoms, No recent travel or sick contacts.   Objective:  Vital signs in last 24 hours:  Vitals:   03/21/24 0023 03/21/24 0510 03/21/24 0745 03/21/24 0758  BP:    133/80  Pulse:    74  Resp: 16 16 16 16   Temp:    98.8 F (37.1 C)  TempSrc:      SpO2: 96% 99%  99%  Weight:      Height:        Intake/Output from previous day:   Intake/Output Summary (Last 24 hours) at 03/21/2024 1129 Last data filed at 03/21/2024 0600 Gross per 24 hour  Intake 1410.2 ml  Output --  Net 1410.2 ml    Physical Exam: General: Alert, awake, oriented x3, in no acute distress.  HEENT: /AT [place], EOMI Neck: Trachea midline,  no masses, no thyromegal,y no JVD, no carotid bruit OROPHARYNX:  Moist, No exudate/ erythema/lesions.  Heart: Regular rate and rhythm, without murmurs, rubs, gallops, PMI non-displaced, no heaves or thrills on palpation.  Lungs: Clear to auscultation, no wheezing or rhonchi noted. No increased vocal fremitus resonant to percussion  Abdomen: Soft, nontender, nondistended, positive bowel sounds, no masses no hepatosplenomegaly noted..  Neuro: No focal neurological deficits noted cranial nerves II through XII grossly intact. DTRs 2+ bilaterally upper and lower extremities. Strength 5 out of 5 in bilateral upper and lower extremities. Musculoskeletal: Generalize body  tenderness Psychiatric: Patient alert and oriented x3, good insight and cognition, good recent to remote recall. Lymph node survey: No cervical axillary or inguinal lymphadenopathy noted.  Lab Results:  Basic Metabolic Panel:    Component Value Date/Time   NA 141 03/20/2024 0031   NA 145 (H) 06/27/2023 1206   K 4.1 03/20/2024 0031   CL 106 03/20/2024 0031   CO2 25 03/20/2024 0031   BUN 15 03/20/2024 0031   BUN 14 06/27/2023 1206   CREATININE 0.86 03/20/2024 0031   GLUCOSE 88 03/20/2024 0031   CALCIUM 9.7 03/20/2024 0031   CBC:    Component Value Date/Time   WBC 14.9 (H) 03/20/2024 0031   HGB 9.8 (L) 03/20/2024 0031   HGB 10.7 (L) 06/27/2023 1206   HCT 26.7 (L) 03/20/2024 0031   HCT 31.7 (L) 06/27/2023 1206   PLT 394 03/20/2024 0031   PLT 455 (H) 06/27/2023 1206   MCV 83.2 03/20/2024 0031   MCV 93 06/27/2023 1206   NEUTROABS 8.0 (H) 03/20/2024 0031   NEUTROABS 6.0 06/27/2023 1206   LYMPHSABS 5.2 (H) 03/20/2024 0031   LYMPHSABS 3.4 (H) 06/27/2023 1206   MONOABS 1.0 03/20/2024 0031   EOSABS 0.2 03/20/2024 0031   EOSABS 0.4 06/27/2023 1206   BASOSABS 0.1 03/20/2024 0031   BASOSABS 0.1 06/27/2023 1206    No results found for this or any previous visit (from the past 240 hours).  Studies/Results: DG CHEST PORT 1 VIEW Result Date: 03/20/2024 CLINICAL DATA:  Chest pain and shortness of breath. EXAM: PORTABLE CHEST  1 VIEW COMPARISON:  08/27/2022 FINDINGS: The cardio pericardial silhouette is enlarged. Subtle patchy basilar opacity bilaterally, left greater than right, is likely atelectatic although infection is not excluded. No pulmonary edema or substantial pleural effusion. No acute bony abnormality. IMPRESSION: Subtle patchy basilar opacity bilaterally, left greater than right, is likely atelectatic although infection is not excluded. Electronically Signed   By: Camellia Candle M.D.   On: 03/20/2024 05:03    Medications: Scheduled Meds:  buprenorphine -naloxone   2 tablet  Sublingual Daily   enoxaparin  (LOVENOX ) injection  40 mg Subcutaneous Q24H   gabapentin   400 mg Oral TID   HYDROmorphone    Intravenous Q4H   influenza vac split trivalent PF  0.5 mL Intramuscular Tomorrow-1000   ketorolac   15 mg Intravenous Q6H   pneumococcal 20-valent conjugate vaccine  0.5 mL Intramuscular Tomorrow-1000   senna-docusate  1 tablet Oral BID   traZODone   50 mg Oral QHS   Continuous Infusions:  sodium chloride  75 mL/hr at 03/21/24 0746   PRN Meds:.diphenhydrAMINE , naloxone  **AND** sodium chloride  flush, ondansetron  (ZOFRAN ) IV, polyethylene glycol  Consultants: None  Procedures: None  Antibiotics: None  Assessment/Plan: Principal Problem:   Sickle cell pain crisis (HCC) Active Problems:   Leukocytosis   Chronic pain   Sickle cell anemia (HCC)   Anxiety and depression   Hb Sickle Cell Disease with Pain crisis: Pain is gradually improving, continue IVF 0.45% Saline @ kvo, continue PCA at current dose. IV Toradol  15 mg Q 6 H for a total of 5 days, continue oral home pain medications as ordered. Monitor vitals very closely, Re-evaluate pain scale regularly, 2 L of Oxygen  by [place]. Patient encouraged to ambulate on the hallway today.  Leukocytosis: Slightly elevated, no acute signs or symptoms of infection, will continue to monitor without antibiotics.  Anemia of Chronic Disease: Hgb within patients baseline , no clinical indication for blood transfusion.  Chronic pain Syndrome: Continue oral home pain medication  Anxiety and depression: denies suicidal ideations. Continue medications as prescribed and follow up as scheduled.   Code Status: Full Code Family Communication: N/A Disposition Plan: Not yet ready for discharge  Homer CHRISTELLA Cover NP   If 7PM-7AM, please contact night-coverage.  03/21/2024, 11:29 AM  LOS: 1 day

## 2024-03-21 NOTE — Plan of Care (Signed)

## 2024-03-21 NOTE — TOC Initial Note (Signed)
 Transition of Care [facility]) - Initial/Assessment Note    Patient Details  Name: Lindsey [name] MRN: [id number] Date of Birth: 1970-11-22  [facility] CM/SW Contact:    Doneta Glenys DASEN, RN Phone Number: 03/21/2024, 4:26 PM  Clinical Narrative:                 Per chart review to inpatient care needs identified.        Patient Goals and CMS Choice            Expected Discharge Plan and Services                                              Prior Living Arrangements/Services                       Activities of Daily Living   ADL Screening (condition at time of admission) Independently performs ADLs?: Yes (appropriate for developmental age) Is the patient deaf or have difficulty hearing?: No Does the patient have difficulty seeing, even when wearing glasses/contacts?: No Does the patient have difficulty concentrating, remembering, or making decisions?: No  Permission Sought/Granted                  Emotional Assessment              Admission diagnosis:  Sickle cell disease with crisis (HCC) [D57.00] Sickle cell pain crisis (HCC) [D57.00] Patient Active Problem List   Diagnosis Date Noted   Constipation 01/19/2024   Screening for lipid disorders 01/19/2024   Vitamin D  deficiency 01/19/2024   Otitis media of left ear 08/08/2023   Need for influenza vaccination 06/27/2023   Hb-SS disease without crisis (HCC) 06/27/2023   Insomnia 06/27/2023   Screening mammogram for breast cancer 06/27/2023   Sickle cell anemia with crisis (HCC) 05/03/2022   Acute low back pain 03/25/2022   GAD (generalized anxiety disorder) 03/25/2022   Sickle cell pain crisis (HCC) 02/11/2022   Acute sickle cell crisis (HCC) 01/28/2022   Anxiety and depression 12/29/2021   Anemia of chronic disease 10/29/2021   Sickle cell anemia (HCC) 09/10/2019   Sickle cell disease (HCC) 10/01/2017   Chronic pain 10/01/2017   Chronic narcotic use  09/25/2017   Leukocytosis 09/03/2016   PCP:  Paseda, Folashade R, FNP Pharmacy:   [facility] [id number] - [place], [place] - [address] AT [facility] OF [address] [place] [address] Phone: [phone] Fax: [phone]     Social Drivers of Health (SDOH) Social History: SDOH Screenings   Food Insecurity: No Food Insecurity (03/20/2024)  Housing: Low Risk  (03/20/2024)  Transportation Needs: No Transportation Needs (03/20/2024)  Utilities: Not At Risk (03/20/2024)  Depression (PHQ2-9): Low Risk  (01/19/2024)  Tobacco Use: Medium Risk (03/19/2024)   SDOH Interventions:     Readmission Risk Interventions    07/08/2022    8:42 AM 02/14/2022   10:02 AM 11/30/2021   10:59 AM  Readmission Risk Prevention Plan  Transportation Screening Complete Complete Complete  Medication Review [employer]) Complete Complete Complete  PCP or Specialist appointment within 3-5 days of discharge Complete Complete Complete  HRI or Home Care Consult Complete Complete Complete  SW Recovery Care/Counseling Consult Complete Complete Complete  Palliative Care Screening Not Applicable Not Applicable Not Applicable  Skilled  Nursing Facility Not Applicable Not Applicable Not Applicable

## 2024-03-21 NOTE — Plan of Care (Signed)
?  Problem: Education: ?Goal: Knowledge of vaso-occlusive preventative measures will improve ?Outcome: Progressing ?Goal: Awareness of infection prevention will improve ?Outcome: Progressing ?Goal: Awareness of signs and symptoms of anemia will improve ?Outcome: Progressing ?Goal: Long-term complications will improve ?Outcome: Progressing ?  ?Problem: Self-Care: ?Goal: Ability to incorporate actions that prevent/reduce pain crisis will improve ?Outcome: Progressing ?  ?

## 2024-03-22 LAB — CBC
HCT: 22.7 % — ABNORMAL LOW (ref 36.0–46.0)
Hemoglobin: 8.5 g/dL — ABNORMAL LOW (ref 12.0–15.0)
MCH: 30.9 pg (ref 26.0–34.0)
MCHC: 37.4 g/dL — ABNORMAL HIGH (ref 30.0–36.0)
MCV: 82.5 fL (ref 80.0–100.0)
Platelets: 311 K/uL (ref 150–400)
RBC: 2.75 MIL/uL — ABNORMAL LOW (ref 3.87–5.11)
RDW: 15.2 % (ref 11.5–15.5)
WBC: 10.3 K/uL (ref 4.0–10.5)
nRBC: 2.3 % — ABNORMAL HIGH (ref 0.0–0.2)

## 2024-03-22 NOTE — Plan of Care (Signed)

## 2024-03-22 NOTE — Progress Notes (Signed)
 Patient ID: Lindsey [name], female   DOB: 1971/01/19, 53 y.o.   MRN: [id number] Subjective: Lindsey [name] is a 53 year old  female with a medical history significant for sickle cell disease,chronic pain syndrome,opiate dependence and tolerance, and history of anemia of chronic disease presents with complaints of bilateral lower extremity pain over the past 24 hours. Pain unrelieved by patient's home medications.   She rates pain as 7/10 this morning constant, and throbbing gradually improving. Denies fever, chills, chest pain, headache. No nausea, vomiting, or diarrhea. No urinary symptoms, No recent travel or sick contacts.   Objective:  Vital signs in last 24 hours:  Vitals:   03/22/24 0428 03/22/24 0457 03/22/24 0750 03/22/24 1001  BP: 115/80  105/69   Pulse: 77  72   Resp: 17 12 16 17   Temp: 98.6 F (37 C)  99 F (37.2 C)   TempSrc: Oral  Oral   SpO2: 98%  96%   Weight:      Height:        Intake/Output from previous day:   Intake/Output Summary (Last 24 hours) at 03/22/2024 1042 Last data filed at 03/22/2024 0842 Gross per 24 hour  Intake 240 ml  Output --  Net 240 ml    Physical Exam: General: Alert, awake, oriented x3, in no acute distress.  HEENT: [place]/AT [place]ER[place], EOMI Neck: Trachea midline,  no masses, no thyromegal,y no JVD, no carotid bruit OROPHARYNX:  Moist, No exudate/ erythema/lesions.  Heart: Regular rate and rhythm, without murmurs, rubs, gallops, PMI non-displaced, no heaves or thrills on palpation.  Lungs: Clear to auscultation, no wheezing or rhonchi noted. No increased vocal fremitus resonant to percussion  Abdomen: Soft, nontender, nondistended, positive bowel sounds, no masses no hepatosplenomegaly noted..  Neuro: No focal neurological deficits noted cranial nerves II through XII grossly intact. DTRs 2+ bilaterally upper and lower extremities. Strength 5 out of 5 in bilateral upper and lower extremities. Musculoskeletal: Generalize body  tenderness Psychiatric: Patient alert and oriented x3, good insight and cognition, good recent to remote recall. Lymph node survey: No cervical axillary or inguinal lymphadenopathy noted.  Lab Results:  Basic Metabolic Panel:    Component Value Date/Time   NA 141 03/20/2024 0031   NA 145 (H) 06/27/2023 1206   K 4.1 03/20/2024 0031   CL 106 03/20/2024 0031   CO2 25 03/20/2024 0031   BUN 15 03/20/2024 0031   BUN 14 06/27/2023 1206   CREATININE 0.86 03/20/2024 0031   GLUCOSE 88 03/20/2024 0031   CALCIUM 9.7 03/20/2024 0031   CBC:    Component Value Date/Time   WBC 10.3 03/22/2024 0439   HGB 8.5 (L) 03/22/2024 0439   HGB 10.7 (L) 06/27/2023 1206   HCT 22.7 (L) 03/22/2024 0439   HCT 31.7 (L) 06/27/2023 1206   PLT 311 03/22/2024 0439   PLT 455 (H) 06/27/2023 1206   MCV 82.5 03/22/2024 0439   MCV 93 06/27/2023 1206   NEUTROABS 8.0 (H) 03/20/2024 0031   NEUTROABS 6.0 06/27/2023 1206   LYMPHSABS 5.2 (H) 03/20/2024 0031   LYMPHSABS 3.4 (H) 06/27/2023 1206   MONOABS 1.0 03/20/2024 0031   EOSABS 0.2 03/20/2024 0031   EOSABS 0.4 06/27/2023 1206   BASOSABS 0.1 03/20/2024 0031   BASOSABS 0.1 06/27/2023 1206    No results found for this or any previous visit (from the past 240 hours).  Studies/Results: No results found.   Medications: Scheduled Meds:  buprenorphine -naloxone   2 tablet Sublingual Daily   enoxaparin  (LOVENOX ) injection  40 mg Subcutaneous Q24H   gabapentin   400 mg Oral TID   HYDROmorphone    Intravenous Q4H   influenza vac split trivalent PF  0.5 mL Intramuscular Tomorrow-1000   ketorolac   15 mg Intravenous Q6H   pneumococcal 20-valent conjugate vaccine  0.5 mL Intramuscular Tomorrow-1000   senna-docusate  1 tablet Oral BID   traZODone   50 mg Oral QHS   Continuous Infusions:   PRN Meds:.diphenhydrAMINE , naloxone  **AND** sodium chloride  flush, ondansetron  (ZOFRAN ) IV, polyethylene  glycol  Consultants: None  Procedures: None  Antibiotics: None  Assessment/Plan: Principal Problem:   Sickle cell pain crisis (HCC) Active Problems:   Leukocytosis   Chronic pain   Sickle cell anemia (HCC)   Anxiety and depression   Hb Sickle Cell Disease with Pain crisis: Pain is gradually improving, continue IVF 0.45% Saline @ kvo, continue PCA at current dose. IV Toradol  15 mg Q 6 H for a total of 5 days, continue oral home pain medications as ordered. Monitor vitals very closely, Re-evaluate pain scale regularly, 2 L of Oxygen  by [place]. Patient encouraged to ambulate on the hallway today.  Leukocytosis: Resolved  Anemia of Chronic Disease: Hgb within patients baseline , no clinical indication for blood transfusion.  Chronic pain Syndrome: Continue oral home pain medication  Anxiety and depression: denies suicidal ideations. Continue medications as prescribed and follow up as scheduled.   Code Status: Full Code Family Communication: N/A Disposition Plan: Not yet ready for discharge  Homer CHRISTELLA Cover NP   If 7PM-7AM, please contact night-coverage.  03/22/2024, 10:42 AM  LOS: 2 days

## 2024-03-23 DIAGNOSIS — D5700 Hb-SS disease with crisis, unspecified: Secondary | ICD-10-CM | POA: Diagnosis not present

## 2024-03-23 LAB — CBC
HCT: 22.2 % — ABNORMAL LOW (ref 36.0–46.0)
Hemoglobin: 8.3 g/dL — ABNORMAL LOW (ref 12.0–15.0)
MCH: 30.7 pg (ref 26.0–34.0)
MCHC: 37.4 g/dL — ABNORMAL HIGH (ref 30.0–36.0)
MCV: 82.2 fL (ref 80.0–100.0)
Platelets: 287 K/uL (ref 150–400)
RBC: 2.7 MIL/uL — ABNORMAL LOW (ref 3.87–5.11)
RDW: 15.1 % (ref 11.5–15.5)
WBC: 10.1 K/uL (ref 4.0–10.5)
nRBC: 1.2 % — ABNORMAL HIGH (ref 0.0–0.2)

## 2024-03-23 MED ORDER — IBUPROFEN 800 MG PO TABS: 800.0000 mg | ORAL_TABLET | Freq: Three times a day (TID) | ORAL | 1 refills | Status: DC | PRN

## 2024-03-23 NOTE — Plan of Care (Signed)

## 2024-03-23 NOTE — Progress Notes (Signed)
 Discharge instructions were reviewed with the patient. She denied questions, or concerns at this time.No changes from am assessment. She is alert, oriented x4. IV site removed, clean dry and intact.

## 2024-03-23 NOTE — Discharge Summary (Signed)
 Physician Discharge Summary   Patient: Lindsey [name] MRN: [id number] DOB: 1970-05-30  Admit date:     03/19/2024  Discharge date: {dischdate:26783}  Discharge Physician: SIM KNOLL   PCP: Paseda, Folashade R, FNP   Recommendations at discharge:  {Tip this will not be part of the note when signed- Example include specific recommendations for outpatient follow-up, pending tests to follow-up on. (Optional):26781}  ***  Discharge Diagnoses: Principal Problem:   Sickle cell pain crisis (HCC) Active Problems:   Leukocytosis   Chronic pain   Sickle cell anemia (HCC)   Anxiety and depression  Resolved Problems:   * No resolved hospital problems. [facility] Course: No notes on file  Assessment and Plan: No notes have been filed under this hospital service. Service: Hospitalist     {Tip this will not be part of the note when signed Body mass index is 26.18 kg/m. , ,  (Optional):26781}  {(NOTE) Pain control PDMP Statment (Optional):26782} Consultants: *** Procedures performed: ***  Disposition: {Plan; Disposition:26390} Diet recommendation:  Discharge Diet Orders (From admission, onward)     Start     Ordered   03/23/24 0000  Diet - low sodium heart healthy        03/23/24 1253           {Diet_Plan:26776} DISCHARGE MEDICATION: Allergies as of 03/23/2024       Reactions   Ketamine  Anxiety, Other (See Comments)   Tachycardia        Medication List     TAKE these medications    buprenorphine -naloxone  2-0.5 mg Subl SL tablet Commonly known as: SUBOXONE  Place 2 tablets under the tongue daily.   carboxymethylcellulose 0.5 % Soln Commonly known as: REFRESH PLUS Place 1 drop into both eyes daily as needed (dry eyes).   gabapentin  400 MG capsule Commonly known as: NEURONTIN  TAKE 1 CAPSULE(400 MG) BY MOUTH THREE TIMES DAILY   ibuprofen  800 MG tablet Commonly known as: ADVIL  Take 1 tablet (800 mg total) by mouth every 8 (eight) hours as  needed.   polyethylene glycol powder 17 GM/SCOOP powder Commonly known as: GLYCOLAX /MIRALAX  Take 17 g by mouth daily as needed. Dissolve 1 capful (17g) in 4-8 ounces of liquid and take by mouth daily.   traZODone  50 MG tablet Commonly known as: DESYREL  Take 1 tablet (50 mg total) by mouth at bedtime.   Vitamin D  (Ergocalciferol ) 1.25 MG (50000 UNIT) Caps capsule Commonly known as: DRISDOL  Take 1 capsule (50,000 Units total) by mouth every 7 (seven) days.        Discharge Exam: Filed Weights   03/19/24 2033  Weight: 60.8 kg   ***  Condition at discharge: {DC Condition:26389}  The results of significant diagnostics from this hospitalization (including imaging, microbiology, ancillary and laboratory) are listed below for reference.   Imaging Studies: DG CHEST PORT 1 VIEW Result Date: 03/20/2024 CLINICAL DATA:  Chest pain and shortness of breath. EXAM: PORTABLE CHEST 1 VIEW COMPARISON:  08/27/2022 FINDINGS: The cardio pericardial silhouette is enlarged. Subtle patchy basilar opacity bilaterally, left greater than right, is likely atelectatic although infection is not excluded. No pulmonary edema or substantial pleural effusion. No acute bony abnormality. IMPRESSION: Subtle patchy basilar opacity bilaterally, left greater than right, is likely atelectatic although infection is not excluded. Electronically Signed   By: Camellia Candle M.D.   On: 03/20/2024 05:03    Microbiology: Results for orders placed or performed during the hospital encounter of 08/08/22  SARS Coronavirus 2 by RT PCR (hospital order, performed  in [facility] hospital lab) *cepheid single result test* Anterior Nasal Swab     Status: None   Collection Time: 08/08/22  2:26 PM   Specimen: Anterior Nasal Swab  Result Value Ref Range Status   SARS Coronavirus 2 by RT PCR NEGATIVE NEGATIVE Final    Comment: (NOTE) SARS-CoV-2 target nucleic acids are NOT DETECTED.  The SARS-CoV-2 RNA is generally detectable in upper  and lower respiratory specimens during the acute phase of infection. The lowest concentration of SARS-CoV-2 viral copies this assay can detect is 250 copies / mL. A negative result does not preclude SARS-CoV-2 infection and should not be used as the sole basis for treatment or other patient management decisions.  A negative result may occur with improper specimen collection / handling, submission of specimen other than nasopharyngeal swab, presence of viral mutation(s) within the areas targeted by this assay, and inadequate number of viral copies (<250 copies / mL). A negative result must be combined with clinical observations, patient history, and epidemiological information.  Fact Sheet for Patients:   roadlaptop.co.za  Fact Sheet for Healthcare Providers: http://kim-miller.com/  This test is not yet approved or  cleared by the [place]  FDA and has been authorized for detection and/or diagnosis of SARS-CoV-2 by FDA under an Emergency Use Authorization (EUA).  This EUA will remain in effect (meaning this test can be used) for the duration of the COVID-19 declaration under Section 564(b)(1) of the Act, 21 U.S.C. section 360bbb-3(b)(1), unless the authorization is terminated or revoked sooner.  Performed at [facility], [address] [place], [place] [address]   MRSA Next Gen by PCR, Nasal     Status: Abnormal   Collection Time: 08/09/22  7:38 AM   Specimen: Nasal Mucosa; Nasal Swab  Result Value Ref Range Status   MRSA by PCR Next Gen DETECTED (A) NOT DETECTED Final    Comment: (NOTE) The GeneXpert MRSA Assay (FDA approved for NASAL specimens only), is one component of a comprehensive MRSA colonization surveillance program. It is not intended to diagnose MRSA infection nor to guide or monitor treatment for MRSA infections. Test performance is not FDA approved in patients less than 14 years old. Performed at  [facility], [address], [place], [place] [address]     Labs: CBC: Recent Labs  Lab 03/19/24 1834 03/20/24 0031 03/22/24 0439 03/23/24 0414  WBC 12.5* 14.9* 10.3 10.1  NEUTROABS 7.6 8.0*  --   --   HGB 9.9* 9.8* 8.5* 8.3*  HCT 26.2* 26.7* 22.7* 22.2*  MCV 82.4 83.2 82.5 82.2  PLT 399 394 311 287   Basic Metabolic Panel: Recent Labs  Lab 03/19/24 1834 03/20/24 0031  NA 141 141  K 4.1 4.1  CL 106 106  CO2 26 25  GLUCOSE 93 88  BUN 16 15  CREATININE 0.92 0.86  CALCIUM 9.6 9.7   Liver Function Tests: Recent Labs  Lab 03/19/24 1834  AST 29  ALT 12  ALKPHOS 49  BILITOT 1.1  PROT 7.6  ALBUMIN 4.7   CBG: No results for input(s): GLUCAP in the last 168 hours.  Discharge time spent: {LESS THAN/GREATER UYJW:73611} 30 minutes.  SignedBETHA SIM KNOLL, MD [facility] 03/23/2024

## 2024-03-25 ENCOUNTER — Telehealth: Payer: Self-pay

## 2024-03-25 NOTE — Transitions of Care (Post Inpatient/ED Visit) (Signed)
   03/25/2024  Name: Lindsey [name] MRN: [id number] DOB: 1970/06/11  Today's TOC FU Call Status: Today's TOC FU Call Status:: Unsuccessful Call (1st Attempt) Unsuccessful Call (1st Attempt) Date: 03/25/24  Attempted to reach the patient regarding the most recent Inpatient/ED visit.  Follow Up Plan: Additional outreach attempts will be made to reach the patient to complete the Transitions of Care (Post Inpatient/ED visit) call.   Alan Ee, RN, BSN, CEN [employer]- Transition of Care Team.  [facility] [phone]

## 2024-03-26 ENCOUNTER — Telehealth: Payer: Self-pay

## 2024-03-26 DIAGNOSIS — D5700 Hb-SS disease with crisis, unspecified: Secondary | ICD-10-CM

## 2024-03-26 NOTE — Hospital Course (Signed)
 Patient with known history of sickle cell disease, chronic pain syndrome, anemia of chronic disease who was admitted with sickle cell pain crisis.  Initial pain was 8 out of 10.  Admitted and initiated on Dilaudid  PCA, Toradol  and IV fluids.  Patient responded to treatment.  Her hemoglobin will was at baseline.  No leukocytosis.  She gradually improved and the pain was down to 3 out of 10.  Patient given prescription for ibuprofen  at home that seems to help for.  Also resuming her chronic pain medications at home.  She will follow-up with PCP.

## 2024-03-26 NOTE — Transitions of Care (Post Inpatient/ED Visit) (Signed)
 03/26/2024  Name: Lindsey [name] MRN: [id number] DOB: 11/14/1970  Today's TOC FU Call Status: Today's TOC FU Call Status:: Successful TOC FU Call Completed TOC FU Call Complete Date: 03/26/24  Patient's Name and Date of Birth confirmed. 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 How have you been since you were released from the hospital?: Better Any questions or concerns?: No  Items Reviewed: Did you receive and understand the discharge instructions provided?: Yes Medications obtained,verified, and reconciled?: Yes (Medications Reviewed) Any new allergies since your discharge?: No Dietary orders reviewed?: Yes Type of Diet Ordered:: low salt heart healthy Do you have support at home?: Yes People in Home [RPT]: child(ren), adult Name of Support/Comfort Primary Source: Lindsey [name]  Medications Reviewed Today: Medications Reviewed Today     Reviewed by Sevin Farone E, RN (Registered Nurse) on 03/26/24 at 1504  Med List Status: <None>   Medication Order Taking? Sig Documenting Provider Last Dose Status Informant  buprenorphine -naloxone  (SUBOXONE ) 2-0.5 mg SUBL SL tablet [id number] Yes Place 2 tablets under the tongue daily. Paseda, Folashade R, FNP  Active Self  carboxymethylcellulose (REFRESH PLUS) 0.5 % SOLN [id number]  Place 1 drop into both eyes daily as needed (dry eyes).  Patient not taking: Reported on 03/26/2024   [provider]  Active Self  gabapentin  (NEURONTIN ) 400 MG capsule [id number] Yes TAKE 1 CAPSULE(400 MG) BY MOUTH THREE TIMES DAILY Paseda, Folashade R, FNP  Active Self  ibuprofen  (ADVIL ) 800 MG tablet [id number] Yes Take 1 tablet (800 mg total) by mouth every 8 (eight) hours as needed. Sim Emery CROME, MD  Active   polyethylene glycol powder  (GLYCOLAX /MIRALAX ) 17 GM/SCOOP powder [id number] Yes Take 17 g by mouth daily as needed. Dissolve 1 capful (17g) in 4-8 ounces of liquid and take by mouth daily. Paseda, Folashade R, FNP  Active Self  traZODone  (DESYREL ) 50 MG tablet [id number] Yes Take 1 tablet (50 mg total) by mouth at bedtime. Oley Bascom RAMAN, NP  Active Self  Vitamin D , Ergocalciferol , (DRISDOL ) 1.25 MG (50000 UNIT) CAPS capsule [id number] Yes Take 1 capsule (50,000 Units total) by mouth every 7 (seven) days. Paseda, Folashade R, FNP  Active Self            Home Care and Equipment/Supplies: Were Home Health Services Ordered?: No Any new equipment or medical supplies ordered?: No  Functional Questionnaire: Do you need assistance with bathing/showering or dressing?: No Do you need assistance with meal preparation?: No Do you need assistance with eating?: No Do you have difficulty maintaining continence: No Do you need assistance with getting out of bed/getting out of a chair/moving?: No Do you have difficulty managing or taking your medications?: No  Follow up appointments reviewed: PCP Follow-up appointment confirmed?: Yes Date of PCP follow-up appointment?: 04/22/24 Follow-up Provider: Folshade paseda, NP [facility] Follow-up appointment confirmed?: NA Do you need transportation to your follow-up appointment?: No Do you understand care options if your condition(s) worsen?: Yes-patient verbalized understanding  SDOH Interventions Today    Flowsheet Row Most Recent Value  SDOH Interventions   Food Insecurity Interventions Intervention Not Indicated  Housing Interventions Intervention Not Indicated  Transportation Interventions Intervention Not Indicated  Utilities Interventions Intervention Not Indicated   Discussed and offered 30 day TOC program.  Patient declines.  The patient has been provided with contact information for the care management team and  has been advised to call with any health -related  questions or concerns.  The patient verbalized understanding with current plan of care.  The patient is directed to their insurance card regarding availability of benefits coverage.    Arvin Seip RN, BSN, CCM [employer], Population Health Case Manager Phone: [phone]

## 2024-03-26 NOTE — Patient Instructions (Signed)
 Visit Information  Thank you for taking time to visit with me today. Please don't hesitate to contact me if I can be of assistance to you   Patient instructions:  Stay hydrated - drink plenty of water Avoid extreme temperatures - heat/ cold ( avoid sudden changes in temperature such as jumping into cold water. Eat a balanced diet rich in fruits, vegetables, whole grains, and lean proteins Take medications as prescribed Follow up with your providers regularly and as recommended Consider doing regular gentle exercise such as walking Call MD for severe uncontrolled pain Call MD for temperature >100 Increase activity slowly Call patient care center [phone] for new or ongoing symptoms/ concerns   Patient verbalizes understanding of instructions and care plan provided today and agrees to view in MyChart. Active MyChart status and patient understanding of how to access instructions and care plan via MyChart confirmed with patient.     The patient has been provided with contact information for the care management team and has been advised to call with any health related questions or concerns.   Please call the care guide team at [phone] if you need to cancel or reschedule your appointment.   Please call the Suicide and Crisis Lifeline: 988 call the [place]  National Suicide Prevention Lifeline: 506-046-6550 or TTY: (479)356-6759 TTY (901)328-6498) to talk to a trained counselor call 1-800-273-TALK (toll free, 24 hour hotline) if you are experiencing a Mental Health or Behavioral Health Crisis or need someone to talk to.  Arvin Seip RN, BSN, CCM [employer], Population Health Case Manager Phone: [phone]

## 2024-04-05 ENCOUNTER — Telehealth: Payer: Self-pay

## 2024-04-05 NOTE — Progress Notes (Signed)
 Complex Care Management Note Care Guide Note  04/05/2024 Name: Lindsey [name] MRN: [id number] DOB: January 21, 1971   Complex Care Management Outreach Attempts: An unsuccessful telephone outreach was attempted today to offer the patient information about available complex care management services.  Follow Up Plan:  Additional outreach attempts will be made to offer the patient complex care management information and services.   Encounter Outcome:  No Answer-No voicemail  Leotis Rase [facility], [facility] Guide  Direct Dial: [phone]  Fax [phone]

## 2024-04-08 NOTE — Progress Notes (Unsigned)
 Complex Care Management Note Care Guide Note  04/08/2024 Name: Lindsey [name] MRN: [id number] DOB: [birth year]   Complex Care Management Outreach Attempts: A second unsuccessful outreach was attempted today to offer the patient with information about available complex care management services.  Follow Up Plan:  Additional outreach attempts will be made to offer the patient complex care management information and services.   Encounter Outcome:  No Answer-Left voicemail  Leotis Rase [facility], [facility] Guide  Direct Dial: [phone]  Fax [phone]

## 2024-04-09 ENCOUNTER — Other Ambulatory Visit: Payer: Self-pay | Admitting: Nurse Practitioner

## 2024-04-09 DIAGNOSIS — G4700 Insomnia, unspecified: Secondary | ICD-10-CM

## 2024-04-09 NOTE — Telephone Encounter (Signed)
 Please advise [facility]

## 2024-04-09 NOTE — Progress Notes (Signed)
 Complex Care Management Note Care Guide Note  04/09/2024 Name: Lindsey [name] MRN: [id number] DOB: 10-21-1970   Complex Care Management Outreach Attempts: A third unsuccessful outreach was attempted today to offer the patient with information about available complex care management services.  Follow Up Plan:  No further outreach attempts will be made at this time. We have been unable to contact the patient to offer or enroll patient in complex care management services.  Encounter Outcome:  No Answer-Left voicemail  Leotis Rase [facility], [facility] Guide  Direct Dial: [phone]  Fax [phone]

## 2024-04-22 ENCOUNTER — Encounter: Payer: Self-pay | Admitting: Nurse Practitioner

## 2024-05-08 ENCOUNTER — Other Ambulatory Visit: Payer: Self-pay | Admitting: Nurse Practitioner

## 2024-05-08 DIAGNOSIS — D571 Sickle-cell disease without crisis: Secondary | ICD-10-CM

## 2024-05-08 NOTE — Telephone Encounter (Signed)
 Copied from CRM #[id number]. Topic: Clinical - Medication Refill >> May 08, 2024  3:07 PM Victoria B wrote: Medication: ibuprofen  (ADVIL ) 800 MG tablet  Has the patient contacted their pharmacy? no  This is the patient's preferred pharmacy:  [id number] - [place], [place] - [address] AT [facility] [name] [place] [address] Phone: [phone] Fax: [phone]  Is this the correct pharmacy for this prescription? yes    Has the prescription been filled recently? no  Is the patient out of the medication? yes  Has the patient been seen for an appointment in the last year OR does the patient have an upcoming appointment? yes  Can we respond through MyChart? no  Agent: Please be advised that Rx refills may take up to 3 business days. We ask that you follow-up with your pharmacy.

## 2024-05-13 MED ORDER — IBUPROFEN 800 MG PO TABS: 800.0000 mg | ORAL_TABLET | Freq: Three times a day (TID) | ORAL | 1 refills | Status: AC | PRN

## 2024-05-22 ENCOUNTER — Encounter: Admitting: Nurse Practitioner

## 2024-05-31 ENCOUNTER — Other Ambulatory Visit: Payer: Self-pay

## 2024-05-31 DIAGNOSIS — F1120 Opioid dependence, uncomplicated: Secondary | ICD-10-CM

## 2024-05-31 DIAGNOSIS — G894 Chronic pain syndrome: Secondary | ICD-10-CM

## 2024-05-31 MED ORDER — BUPRENORPHINE HCL-NALOXONE HCL 2-0.5 MG SL SUBL: 2.0000 | SUBLINGUAL_TABLET | Freq: Every day | SUBLINGUAL | 0 refills | Status: AC

## 2024-05-31 NOTE — Telephone Encounter (Signed)
 Copied from CRM #[id number]. Topic: Clinical - Medication Refill >> May 30, 2024  5:07 PM Alfonso HERO wrote: Medication: buprenorphine -naloxone  (SUBOXONE ) 2-0.5 mg SUBL SL tablet  Has the patient contacted their pharmacy? No (Agent: If no, request that the patient contact the pharmacy for the refill. If patient does not wish to contact the pharmacy document the reason why and proceed with request.) (Agent: If yes, when and what did the pharmacy advise?)  This is the patient's preferred pharmacy:  [id number] - [place], [place] - [address] AT [facility] OF [name] [place] [address] Phone: [phone] Fax: [phone]  Is this the correct pharmacy for this prescription? Yes If no, delete pharmacy and type the correct one.   Has the prescription been filled recently? Yes  Is the patient out of the medication? Yes  Has the patient been seen for an appointment in the last year OR does the patient have an upcoming appointment? Yes  Can we respond through MyChart? Yes  Agent: Please be advised that Rx refills may take up to 3 business days. We ask that you follow-up with your pharmacy.  Please Advise.  CB.

## 2024-06-07 ENCOUNTER — Ambulatory Visit: Payer: Self-pay | Admitting: Nurse Practitioner

## 2024-06-07 ENCOUNTER — Encounter: Payer: Self-pay | Admitting: Nurse Practitioner

## 2024-06-07 VITALS — BP 101/59 | HR 68 | Wt 134.0 lb

## 2024-06-07 DIAGNOSIS — Z1231 Encounter for screening mammogram for malignant neoplasm of breast: Secondary | ICD-10-CM

## 2024-06-07 DIAGNOSIS — H9312 Tinnitus, left ear: Secondary | ICD-10-CM | POA: Insufficient documentation

## 2024-06-07 DIAGNOSIS — Z0000 Encounter for general adult medical examination without abnormal findings: Secondary | ICD-10-CM | POA: Insufficient documentation

## 2024-06-07 DIAGNOSIS — Z1211 Encounter for screening for malignant neoplasm of colon: Secondary | ICD-10-CM

## 2024-06-07 DIAGNOSIS — D571 Sickle-cell disease without crisis: Secondary | ICD-10-CM

## 2024-06-07 DIAGNOSIS — D5700 Hb-SS disease with crisis, unspecified: Secondary | ICD-10-CM | POA: Diagnosis not present

## 2024-06-07 DIAGNOSIS — E559 Vitamin D deficiency, unspecified: Secondary | ICD-10-CM

## 2024-06-07 DIAGNOSIS — G894 Chronic pain syndrome: Secondary | ICD-10-CM | POA: Diagnosis not present

## 2024-06-07 DIAGNOSIS — N951 Menopausal and female climacteric states: Secondary | ICD-10-CM | POA: Diagnosis not present

## 2024-06-07 NOTE — Assessment & Plan Note (Signed)
" °-   Encouraged flu , pneumonia and shingles vaccines. - Ordered Cologuard test. - Ordered mammogram. - Encouraged Cologuard test completion "

## 2024-06-07 NOTE — Assessment & Plan Note (Signed)
 Last vitamin D  Lab Results  Component Value Date   VD25OH 20.5 (L) 06/27/2023   Vitamin D  supplementation discussed. - Continue vitamin D  50,000 units weekly as prescribed.

## 2024-06-07 NOTE — Assessment & Plan Note (Signed)
 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 Suboxone  use decreased. Ibuprofen  overuse risks kidney health. Gabapentin  effective. .- Limit ibuprofen  to 800 mg every 6 hours with food. - Continue gabapentin .  300 mg 3 times daily - Ensure hydration with 64 ounces of water daily. - Avoid sickle cell crisis triggers. - Seek medical attention for crisis signs.  Addendum.  I spoke with Dr. [name] who advised that the patient should continue Suboxone  as she has been taking the medication because her pain could become uncontrolled if she stops taking the medication, will advise the patient

## 2024-06-07 NOTE — Addendum Note (Signed)
 Addended by: JUANICE PARSON R on: 06/07/2024 10:27 PM   Modules accepted: Orders

## 2024-06-07 NOTE — Assessment & Plan Note (Signed)
 Tinnitus, left ear Persistent tinnitus post-ear infection. ENT noted improvement, but symptoms persist. Reluctant for ENT follow-up. - Encouraged ENT follow-up for further evaluation.

## 2024-06-07 NOTE — Patient Instructions (Signed)
 1. 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 Chronic pain syndrome  3. Sickle cell anemia with pain (HCC)  4. Screening for colon cancer - Cologuard  5. Screening mammogram for breast cancer - MM 3D SCREENING MAMMOGRAM BILATERAL BREAST; Future  Please call [phone]   to schedule your mammogram.  [facility]. [address] [place], [place] [address]. [place] .    It is important that you exercise regularly at least 30 minutes 5 times a week as tolerated  Think about what you will eat, plan ahead. Choose  clean, green, fresh or frozen over canned, processed or packaged foods which are more sugary, salty and fatty. 70 to 75% of food eaten should be vegetables and fruit. Three meals at set times with snacks allowed between meals, but they must be fruit or vegetables. Aim to eat over a 12 hour period , example 7 am to 7 pm, and STOP after  your last meal of the day. Drink water,generally about 64 ounces per day, no other drink is as healthy. Fruit juice is best enjoyed in a healthy way, by EATING the fruit.  Thanks for choosing [facility] we consider it a privelige to serve you.

## 2024-06-08 LAB — CMP14+CBC/D/PLT+FER+RETIC+V...
ALT: 8 [IU]/L (ref 0–32)
AST: 18 [IU]/L (ref 0–40)
Albumin: 4.5 g/dL (ref 3.8–4.9)
Alkaline Phosphatase: 49 [IU]/L (ref 49–135)
BUN/Creatinine Ratio: 13 (ref 9–23)
BUN: 11 mg/dL (ref 6–24)
Basophils Absolute: 0.1 10*3/uL (ref 0.0–0.2)
Basos: 1 %
Bilirubin Total: 0.8 mg/dL (ref 0.0–1.2)
CO2: 24 mmol/L (ref 20–29)
Calcium: 9.6 mg/dL (ref 8.7–10.2)
Chloride: 106 mmol/L (ref 96–106)
Creatinine, Ser: 0.86 mg/dL (ref 0.57–1.00)
EOS (ABSOLUTE): 0.3 10*3/uL (ref 0.0–0.4)
Eos: 3 %
Ferritin: 395 ng/mL — ABNORMAL HIGH (ref 15–150)
Globulin, Total: 2.5 g/dL (ref 1.5–4.5)
Glucose: 107 mg/dL — ABNORMAL HIGH (ref 70–99)
Hematocrit: 28.3 % — ABNORMAL LOW (ref 34.0–46.6)
Hemoglobin: 9.7 g/dL — ABNORMAL LOW (ref 11.1–15.9)
Immature Grans (Abs): 0 10*3/uL (ref 0.0–0.1)
Immature Granulocytes: 0 %
Lymphocytes Absolute: 3.9 10*3/uL — ABNORMAL HIGH (ref 0.7–3.1)
Lymphs: 49 %
MCH: 30.3 pg (ref 26.6–33.0)
MCHC: 34.3 g/dL (ref 31.5–35.7)
MCV: 88 fL (ref 79–97)
Monocytes Absolute: 0.6 10*3/uL (ref 0.1–0.9)
Monocytes: 7 %
Neutrophils Absolute: 3.2 10*3/uL (ref 1.4–7.0)
Neutrophils: 40 %
Platelets: 413 10*3/uL (ref 150–450)
Potassium: 4.4 mmol/L (ref 3.5–5.2)
RBC: 3.2 x10E6/uL — ABNORMAL LOW (ref 3.77–5.28)
RDW: 16.7 % — ABNORMAL HIGH (ref 11.7–15.4)
Retic Ct Pct: 3.3 % — ABNORMAL HIGH (ref 0.6–2.6)
Sodium: 143 mmol/L (ref 134–144)
Total Protein: 7 g/dL (ref 6.0–8.5)
Vit D, 25-Hydroxy: 57.9 ng/mL (ref 30.0–100.0)
WBC: 8 10*3/uL (ref 3.4–10.8)
eGFR: 81 mL/min/{1.73_m2}

## 2024-06-10 ENCOUNTER — Ambulatory Visit: Payer: Self-pay | Admitting: Nurse Practitioner

## 2024-06-13 LAB — TOXASSURE FLEX 15, UR
6-ACETYLMORPHINE IA: NEGATIVE ng/mL
7-aminoclonazepam: NOT DETECTED ng/mg{creat}
AMPHETAMINES IA: NEGATIVE ng/mL
Alpha-hydroxyalprazolam: NOT DETECTED ng/mg{creat}
Alpha-hydroxymidazolam: NOT DETECTED ng/mg{creat}
Alpha-hydroxytriazolam: NOT DETECTED ng/mg{creat}
Alprazolam: NOT DETECTED ng/mg{creat}
BARBITURATES IA: NEGATIVE ng/mL
BUPRENORPHINE: POSITIVE
Buprenorphine: 13 ng/mg{creat}
CANNABINOIDS IA: NEGATIVE ng/mL
COCAINE METABOLITE IA: NEGATIVE ng/mL
Clonazepam: NOT DETECTED ng/mg{creat}
Creatinine: 64 mg/dL
Desalkylflurazepam: NOT DETECTED ng/mg{creat}
Desmethyldiazepam: NOT DETECTED ng/mg{creat}
Desmethylflunitrazepam: NOT DETECTED ng/mg{creat}
Diazepam: NOT DETECTED ng/mg{creat}
ETHYL ALCOHOL Enzymatic: NEGATIVE g/dL
Fentanyl: NOT DETECTED ng/mg{creat}
Flunitrazepam: NOT DETECTED ng/mg{creat}
Lorazepam: NOT DETECTED ng/mg{creat}
METHADONE IA: NEGATIVE ng/mL
METHADONE MTB IA: NEGATIVE ng/mL
Midazolam: NOT DETECTED ng/mg{creat}
Norbuprenorphine: 67 ng/mg{creat}
Norfentanyl: NOT DETECTED ng/mg{creat}
OPIATE CLASS IA: NEGATIVE ng/mL
OXYCODONE CLASS IA: NEGATIVE ng/mL
Oxazepam: NOT DETECTED ng/mg{creat}
PHENCYCLIDINE IA: NEGATIVE ng/mL
TAPENTADOL, IA: NEGATIVE ng/mL
TRAMADOL IA: NEGATIVE ng/mL
Temazepam: NOT DETECTED ng/mg{creat}

## 2024-06-17 ENCOUNTER — Ambulatory Visit (INDEPENDENT_AMBULATORY_CARE_PROVIDER_SITE_OTHER): Admitting: Physician Assistant

## 2024-08-20 ENCOUNTER — Encounter: Admitting: Nurse Practitioner

## 2024-10-09 ENCOUNTER — Encounter: Payer: Self-pay | Admitting: Nurse Practitioner
